# Patient Record
Sex: Female | Born: 1940
Health system: Southern US, Community
[De-identification: ages and names within clinical notes are randomized; demographics above are authoritative.]

## PROBLEM LIST (undated history)

## (undated) DIAGNOSIS — K589 Irritable bowel syndrome without diarrhea: Secondary | ICD-10-CM

## (undated) DIAGNOSIS — F419 Anxiety disorder, unspecified: Secondary | ICD-10-CM

## (undated) DIAGNOSIS — H6980 Other specified disorders of Eustachian tube, unspecified ear: Secondary | ICD-10-CM

## (undated) DIAGNOSIS — H6990 Unspecified Eustachian tube disorder, unspecified ear: Secondary | ICD-10-CM

## (undated) DIAGNOSIS — G3184 Mild cognitive impairment, so stated: Secondary | ICD-10-CM

## (undated) DIAGNOSIS — I639 Cerebral infarction, unspecified: Secondary | ICD-10-CM

## (undated) DIAGNOSIS — M1990 Unspecified osteoarthritis, unspecified site: Secondary | ICD-10-CM

## (undated) DIAGNOSIS — G4730 Sleep apnea, unspecified: Secondary | ICD-10-CM

## (undated) DIAGNOSIS — R55 Syncope and collapse: Secondary | ICD-10-CM

## (undated) DIAGNOSIS — I10 Essential (primary) hypertension: Secondary | ICD-10-CM

## (undated) DIAGNOSIS — K219 Gastro-esophageal reflux disease without esophagitis: Secondary | ICD-10-CM

## (undated) DIAGNOSIS — H269 Unspecified cataract: Secondary | ICD-10-CM

## (undated) DIAGNOSIS — E079 Disorder of thyroid, unspecified: Secondary | ICD-10-CM

## (undated) DIAGNOSIS — IMO0002 Reserved for concepts with insufficient information to code with codable children: Secondary | ICD-10-CM

## (undated) HISTORY — DX: Anxiety disorder, unspecified: F41.9

## (undated) HISTORY — DX: Unspecified osteoarthritis, unspecified site: M19.90

## (undated) HISTORY — DX: Gastro-esophageal reflux disease without esophagitis: K21.9

## (undated) HISTORY — DX: Other specified disorders of Eustachian tube, unspecified ear: H69.80

## (undated) HISTORY — DX: Essential (primary) hypertension: I10

## (undated) HISTORY — PX: ESOPHAGOGASTRODUODENOSCOPY: SHX1529

## (undated) HISTORY — DX: Cerebral infarction, unspecified: I63.9

## (undated) HISTORY — PX: EYE SURGERY: SHX253

## (undated) HISTORY — DX: Unspecified cataract: H26.9

## (undated) HISTORY — DX: Disorder of thyroid, unspecified: E07.9

## (undated) HISTORY — DX: Unspecified eustachian tube disorder, unspecified ear: H69.90

## (undated) HISTORY — PX: APPENDECTOMY: SHX54

## (undated) HISTORY — PX: COLONOSCOPY: SHX174

## (undated) HISTORY — DX: Mild cognitive impairment of uncertain or unknown etiology: G31.84

## (undated) HISTORY — DX: Reserved for concepts with insufficient information to code with codable children: IMO0002

## (undated) HISTORY — DX: Irritable bowel syndrome, unspecified: K58.9

## (undated) HISTORY — PX: FRACTURE SURGERY: SHX138

---

## 1982-08-21 HISTORY — PX: LUMBAR DISC SURGERY: SHX700

## 2000-12-24 ENCOUNTER — Encounter (INDEPENDENT_AMBULATORY_CARE_PROVIDER_SITE_OTHER): Payer: Self-pay

## 2000-12-24 ENCOUNTER — Other Ambulatory Visit: Admission: RE | Admit: 2000-12-24 | Discharge: 2000-12-24 | Payer: Self-pay | Admitting: Gastroenterology

## 2000-12-25 ENCOUNTER — Other Ambulatory Visit: Admission: RE | Admit: 2000-12-25 | Discharge: 2000-12-25 | Payer: Self-pay | Admitting: Family Medicine

## 2001-02-04 ENCOUNTER — Encounter: Payer: Self-pay | Admitting: Gastroenterology

## 2001-12-26 ENCOUNTER — Other Ambulatory Visit: Admission: RE | Admit: 2001-12-26 | Discharge: 2001-12-26 | Payer: Self-pay | Admitting: Family Medicine

## 2002-12-30 ENCOUNTER — Other Ambulatory Visit: Admission: RE | Admit: 2002-12-30 | Discharge: 2002-12-30 | Payer: Self-pay | Admitting: Family Medicine

## 2003-05-28 ENCOUNTER — Ambulatory Visit (HOSPITAL_COMMUNITY): Admission: RE | Admit: 2003-05-28 | Discharge: 2003-05-28 | Payer: Self-pay | Admitting: Gastroenterology

## 2003-05-28 ENCOUNTER — Encounter: Payer: Self-pay | Admitting: Gastroenterology

## 2003-12-31 ENCOUNTER — Other Ambulatory Visit: Admission: RE | Admit: 2003-12-31 | Discharge: 2003-12-31 | Payer: Self-pay | Admitting: Family Medicine

## 2004-06-22 ENCOUNTER — Ambulatory Visit: Payer: Self-pay | Admitting: Gastroenterology

## 2004-08-25 ENCOUNTER — Ambulatory Visit: Payer: Self-pay | Admitting: Gastroenterology

## 2005-01-05 ENCOUNTER — Ambulatory Visit: Payer: Self-pay | Admitting: Family Medicine

## 2005-01-05 ENCOUNTER — Other Ambulatory Visit: Admission: RE | Admit: 2005-01-05 | Discharge: 2005-01-05 | Payer: Self-pay | Admitting: Family Medicine

## 2005-01-13 ENCOUNTER — Ambulatory Visit: Payer: Self-pay | Admitting: Family Medicine

## 2005-05-03 ENCOUNTER — Ambulatory Visit: Payer: Self-pay | Admitting: Family Medicine

## 2005-06-16 ENCOUNTER — Ambulatory Visit: Payer: Self-pay | Admitting: Family Medicine

## 2005-06-20 ENCOUNTER — Ambulatory Visit: Payer: Self-pay | Admitting: Gastroenterology

## 2005-09-04 ENCOUNTER — Ambulatory Visit: Payer: Self-pay | Admitting: Family Medicine

## 2006-01-08 ENCOUNTER — Ambulatory Visit: Payer: Self-pay | Admitting: Family Medicine

## 2006-01-18 ENCOUNTER — Ambulatory Visit: Payer: Self-pay | Admitting: Family Medicine

## 2006-03-12 ENCOUNTER — Ambulatory Visit: Payer: Self-pay | Admitting: Family Medicine

## 2006-03-26 ENCOUNTER — Encounter: Admission: RE | Admit: 2006-03-26 | Discharge: 2006-03-26 | Payer: Self-pay | Admitting: General Surgery

## 2006-05-29 ENCOUNTER — Ambulatory Visit: Payer: Self-pay | Admitting: Family Medicine

## 2006-10-20 LAB — CONVERTED CEMR LAB: Pap Smear: NORMAL

## 2006-10-30 ENCOUNTER — Encounter: Admission: RE | Admit: 2006-10-30 | Discharge: 2006-10-30 | Payer: Self-pay | Admitting: Family Medicine

## 2006-11-01 ENCOUNTER — Other Ambulatory Visit: Admission: RE | Admit: 2006-11-01 | Discharge: 2006-11-01 | Payer: Self-pay | Admitting: Family Medicine

## 2006-11-01 ENCOUNTER — Encounter: Payer: Self-pay | Admitting: Family Medicine

## 2006-11-01 ENCOUNTER — Ambulatory Visit: Payer: Self-pay | Admitting: Family Medicine

## 2006-11-01 LAB — CONVERTED CEMR LAB
ALT: 18 units/L (ref 0–40)
AST: 22 units/L (ref 0–37)
Albumin: 3.7 g/dL (ref 3.5–5.2)
Alkaline Phosphatase: 70 units/L (ref 39–117)
BUN: 15 mg/dL (ref 6–23)
Basophils Absolute: 0 10*3/uL (ref 0.0–0.1)
Basophils Relative: 0.3 % (ref 0.0–1.0)
Bilirubin, Direct: 0.1 mg/dL (ref 0.0–0.3)
CO2: 33 meq/L — ABNORMAL HIGH (ref 19–32)
Calcium: 9.5 mg/dL (ref 8.4–10.5)
Chloride: 104 meq/L (ref 96–112)
Cholesterol: 223 mg/dL (ref 0–200)
Creatinine, Ser: 0.8 mg/dL (ref 0.4–1.2)
Direct LDL: 110.9 mg/dL
Eosinophils Absolute: 0.1 10*3/uL (ref 0.0–0.6)
Eosinophils Relative: 1.5 % (ref 0.0–5.0)
GFR calc Af Amer: 93 mL/min
GFR calc non Af Amer: 77 mL/min
Glucose, Bld: 85 mg/dL (ref 70–99)
HCT: 40.8 % (ref 36.0–46.0)
HDL: 78.1 mg/dL (ref >39.0)
Hemoglobin: 13.9 g/dL (ref 12.0–15.0)
Lymphocytes Relative: 29.4 % (ref 12.0–46.0)
MCHC: 34.1 g/dL (ref 30.0–36.0)
MCV: 88.4 fL (ref 78.0–100.0)
Monocytes Absolute: 0.4 10*3/uL (ref 0.2–0.7)
Monocytes Relative: 8.3 % (ref 3.0–11.0)
Neutro Abs: 3.2 10*3/uL (ref 1.4–7.7)
Neutrophils Relative %: 60.5 % (ref 43.0–77.0)
Platelets: 282 10*3/uL (ref 150–400)
Potassium: 3.8 meq/L (ref 3.5–5.1)
RBC: 4.61 M/uL (ref 3.87–5.11)
RDW: 13.2 % (ref 11.5–14.6)
Sodium: 143 meq/L (ref 135–145)
TSH: 2.4 microintl units/mL (ref 0.35–5.50)
Total Bilirubin: 0.9 mg/dL (ref 0.3–1.2)
Total CHOL/HDL Ratio: 2.9
Total Protein: 6.9 g/dL (ref 6.0–8.3)
Triglycerides: 68 mg/dL (ref 0–149)
VLDL: 14 mg/dL (ref 0–40)
WBC: 5.2 10*3/uL (ref 4.5–10.5)

## 2006-11-15 ENCOUNTER — Ambulatory Visit: Payer: Self-pay | Admitting: Family Medicine

## 2006-12-04 ENCOUNTER — Ambulatory Visit: Payer: Self-pay | Admitting: Family Medicine

## 2007-05-16 ENCOUNTER — Telehealth: Payer: Self-pay | Admitting: Family Medicine

## 2007-05-21 ENCOUNTER — Encounter: Admission: RE | Admit: 2007-05-21 | Discharge: 2007-05-21 | Payer: Self-pay | Admitting: Family Medicine

## 2007-05-23 ENCOUNTER — Encounter (INDEPENDENT_AMBULATORY_CARE_PROVIDER_SITE_OTHER): Payer: Self-pay | Admitting: *Deleted

## 2007-07-02 ENCOUNTER — Telehealth: Payer: Self-pay | Admitting: Family Medicine

## 2007-12-05 ENCOUNTER — Ambulatory Visit: Payer: Self-pay | Admitting: Family Medicine

## 2007-12-05 DIAGNOSIS — K219 Gastro-esophageal reflux disease without esophagitis: Secondary | ICD-10-CM | POA: Insufficient documentation

## 2007-12-05 DIAGNOSIS — H8109 Meniere's disease, unspecified ear: Secondary | ICD-10-CM | POA: Insufficient documentation

## 2007-12-05 DIAGNOSIS — E039 Hypothyroidism, unspecified: Secondary | ICD-10-CM | POA: Insufficient documentation

## 2007-12-10 ENCOUNTER — Ambulatory Visit: Payer: Self-pay | Admitting: Family Medicine

## 2007-12-11 LAB — CONVERTED CEMR LAB
Albumin: 4 g/dL (ref 3.5–5.2)
BUN: 12 mg/dL (ref 6–23)
Basophils Absolute: 0 10*3/uL (ref 0.0–0.1)
Basophils Relative: 0.1 % (ref 0.0–1.0)
CO2: 32 meq/L (ref 19–32)
Calcium: 9.8 mg/dL (ref 8.4–10.5)
Chloride: 103 meq/L (ref 96–112)
Cholesterol: 216 mg/dL (ref 0–200)
Creatinine, Ser: 0.9 mg/dL (ref 0.4–1.2)
Direct LDL: 118 mg/dL
Eosinophils Absolute: 0.1 10*3/uL (ref 0.0–0.7)
Eosinophils Relative: 1.5 % (ref 0.0–5.0)
GFR calc Af Amer: 81 mL/min
GFR calc non Af Amer: 67 mL/min
Glucose, Bld: 92 mg/dL (ref 70–99)
HCT: 43.1 % (ref 36.0–46.0)
HDL: 78.5 mg/dL (ref >39.0)
Hemoglobin: 14.1 g/dL (ref 12.0–15.0)
Lymphocytes Relative: 30.8 % (ref 12.0–46.0)
MCHC: 32.7 g/dL (ref 30.0–36.0)
MCV: 92.1 fL (ref 78.0–100.0)
Monocytes Absolute: 0.4 10*3/uL (ref 0.1–1.0)
Monocytes Relative: 8.2 % (ref 3.0–12.0)
Neutro Abs: 2.9 10*3/uL (ref 1.4–7.7)
Neutrophils Relative %: 59.4 % (ref 43.0–77.0)
Phosphorus: 4.1 mg/dL (ref 2.3–4.6)
Platelets: 253 10*3/uL (ref 150–400)
Potassium: 4.3 meq/L (ref 3.5–5.1)
RBC: 4.68 M/uL (ref 3.87–5.11)
RDW: 12.9 % (ref 11.5–14.6)
Sodium: 141 meq/L (ref 135–145)
Total CHOL/HDL Ratio: 2.8
Triglycerides: 55 mg/dL (ref 0–149)
VLDL: 11 mg/dL (ref 0–40)
WBC: 4.9 10*3/uL (ref 4.5–10.5)

## 2007-12-12 ENCOUNTER — Telehealth: Payer: Self-pay | Admitting: Family Medicine

## 2007-12-30 ENCOUNTER — Ambulatory Visit: Payer: Self-pay | Admitting: Family Medicine

## 2007-12-31 LAB — CONVERTED CEMR LAB
OCCULT 1: NEGATIVE
OCCULT 2: NEGATIVE
OCCULT 3: NEGATIVE

## 2008-01-27 ENCOUNTER — Ambulatory Visit: Payer: Self-pay | Admitting: Family Medicine

## 2008-01-27 LAB — CONVERTED CEMR LAB
Bilirubin Urine: NEGATIVE
Casts: 0 /lpf
Glucose, Urine, Semiquant: NEGATIVE
Ketones, urine, test strip: NEGATIVE
Mucus, UA: 0
Nitrite: NEGATIVE
RBC / HPF: 0
Specific Gravity, Urine: 1.015
Urine crystals, microscopic: 0 /hpf
Urobilinogen, UA: 0.2
WBC Urine, dipstick: NEGATIVE
Yeast, UA: 0
pH: 5

## 2008-01-28 ENCOUNTER — Encounter: Payer: Self-pay | Admitting: Family Medicine

## 2008-02-25 ENCOUNTER — Encounter: Payer: Self-pay | Admitting: Family Medicine

## 2008-05-26 ENCOUNTER — Encounter: Admission: RE | Admit: 2008-05-26 | Discharge: 2008-05-26 | Payer: Self-pay | Admitting: Family Medicine

## 2008-06-30 ENCOUNTER — Ambulatory Visit: Payer: Self-pay | Admitting: Gastroenterology

## 2008-07-01 ENCOUNTER — Telehealth: Payer: Self-pay | Admitting: Gastroenterology

## 2008-07-08 ENCOUNTER — Ambulatory Visit: Payer: Self-pay | Admitting: Gastroenterology

## 2008-07-08 ENCOUNTER — Ambulatory Visit (HOSPITAL_COMMUNITY): Admission: RE | Admit: 2008-07-08 | Discharge: 2008-07-08 | Payer: Self-pay | Admitting: Gastroenterology

## 2008-07-08 ENCOUNTER — Encounter: Payer: Self-pay | Admitting: Gastroenterology

## 2008-07-10 ENCOUNTER — Encounter: Payer: Self-pay | Admitting: Gastroenterology

## 2008-07-13 ENCOUNTER — Ambulatory Visit (HOSPITAL_COMMUNITY): Admission: RE | Admit: 2008-07-13 | Discharge: 2008-07-13 | Payer: Self-pay | Admitting: Gastroenterology

## 2008-09-21 ENCOUNTER — Telehealth: Payer: Self-pay | Admitting: Gastroenterology

## 2008-10-06 ENCOUNTER — Ambulatory Visit: Payer: Self-pay | Admitting: Family Medicine

## 2008-10-06 LAB — CONVERTED CEMR LAB
Bacteria, UA: 0
Bilirubin Urine: NEGATIVE
Blood in Urine, dipstick: NEGATIVE
Glucose, Urine, Semiquant: NEGATIVE
Ketones, urine, test strip: NEGATIVE
Nitrite: NEGATIVE
Specific Gravity, Urine: 1.015
Urobilinogen, UA: 0.2
pH: 6.5

## 2008-12-10 ENCOUNTER — Ambulatory Visit: Payer: Self-pay | Admitting: Family Medicine

## 2008-12-10 DIAGNOSIS — Z780 Asymptomatic menopausal state: Secondary | ICD-10-CM | POA: Insufficient documentation

## 2008-12-11 ENCOUNTER — Telehealth: Payer: Self-pay | Admitting: Family Medicine

## 2008-12-11 LAB — CONVERTED CEMR LAB
ALT: 19 units/L (ref 0–35)
AST: 25 units/L (ref 0–37)
Albumin: 4.1 g/dL (ref 3.5–5.2)
Alkaline Phosphatase: 75 units/L (ref 39–117)
BUN: 15 mg/dL (ref 6–23)
Basophils Absolute: 0.1 10*3/uL (ref 0.0–0.1)
Basophils Relative: 1 % (ref 0.0–3.0)
Bilirubin, Direct: 0.1 mg/dL (ref 0.0–0.3)
CO2: 29 meq/L (ref 19–32)
Calcium: 9.4 mg/dL (ref 8.4–10.5)
Chloride: 103 meq/L (ref 96–112)
Creatinine, Ser: 0.9 mg/dL (ref 0.4–1.2)
Eosinophils Absolute: 0.1 10*3/uL (ref 0.0–0.7)
Eosinophils Relative: 2.4 % (ref 0.0–5.0)
Glucose, Bld: 84 mg/dL (ref 70–99)
HCT: 40.7 % (ref 36.0–46.0)
Hemoglobin: 13.9 g/dL (ref 12.0–15.0)
Lymphocytes Relative: 31.5 % (ref 12.0–46.0)
Lymphs Abs: 1.6 10*3/uL (ref 0.7–4.0)
MCHC: 34.2 g/dL (ref 30.0–36.0)
MCV: 90.9 fL (ref 78.0–100.0)
Monocytes Absolute: 0.5 10*3/uL (ref 0.1–1.0)
Monocytes Relative: 9.4 % (ref 3.0–12.0)
Neutro Abs: 2.9 10*3/uL (ref 1.4–7.7)
Neutrophils Relative %: 55.7 % (ref 43.0–77.0)
Phosphorus: 4.5 mg/dL (ref 2.3–4.6)
Platelets: 234 10*3/uL (ref 150.0–400.0)
Potassium: 3.8 meq/L (ref 3.5–5.1)
RBC: 4.48 M/uL (ref 3.87–5.11)
RDW: 13 % (ref 11.5–14.6)
Sodium: 139 meq/L (ref 135–145)
TSH: 2.5 microintl units/mL (ref 0.35–5.50)
Total Bilirubin: 0.9 mg/dL (ref 0.3–1.2)
Total Protein: 7 g/dL (ref 6.0–8.3)
WBC: 5.2 10*3/uL (ref 4.5–10.5)

## 2008-12-17 ENCOUNTER — Telehealth: Payer: Self-pay | Admitting: Family Medicine

## 2008-12-22 ENCOUNTER — Ambulatory Visit: Payer: Self-pay | Admitting: Family Medicine

## 2008-12-24 ENCOUNTER — Encounter (INDEPENDENT_AMBULATORY_CARE_PROVIDER_SITE_OTHER): Payer: Self-pay | Admitting: *Deleted

## 2008-12-24 LAB — CONVERTED CEMR LAB
OCCULT 1: NEGATIVE
OCCULT 2: NEGATIVE
OCCULT 3: NEGATIVE

## 2009-01-21 ENCOUNTER — Encounter: Payer: Self-pay | Admitting: Family Medicine

## 2009-04-02 ENCOUNTER — Ambulatory Visit: Payer: Self-pay | Admitting: Family Medicine

## 2009-04-02 DIAGNOSIS — K589 Irritable bowel syndrome without diarrhea: Secondary | ICD-10-CM | POA: Insufficient documentation

## 2009-04-02 DIAGNOSIS — R5383 Other fatigue: Secondary | ICD-10-CM

## 2009-04-02 DIAGNOSIS — R5381 Other malaise: Secondary | ICD-10-CM | POA: Insufficient documentation

## 2009-04-05 LAB — CONVERTED CEMR LAB
ALT: 18 units/L (ref 0–35)
AST: 24 units/L (ref 0–37)
Albumin: 4.4 g/dL (ref 3.5–5.2)
Alkaline Phosphatase: 71 units/L (ref 39–117)
BUN: 16 mg/dL (ref 6–23)
Basophils Absolute: 0 10*3/uL (ref 0.0–0.1)
Basophils Relative: 0 % (ref 0.0–3.0)
Bilirubin, Direct: 0.1 mg/dL (ref 0.0–0.3)
CO2: 30 meq/L (ref 19–32)
Calcium: 9.5 mg/dL (ref 8.4–10.5)
Chloride: 103 meq/L (ref 96–112)
Creatinine, Ser: 0.9 mg/dL (ref 0.4–1.2)
Eosinophils Absolute: 0.1 10*3/uL (ref 0.0–0.7)
Eosinophils Relative: 1.9 % (ref 0.0–5.0)
Folate: 10.5 ng/mL
GFR calc non Af Amer: 66.22 mL/min (ref >60)
Glucose, Bld: 85 mg/dL (ref 70–99)
HCT: 41.7 % (ref 36.0–46.0)
Hemoglobin: 13.9 g/dL (ref 12.0–15.0)
Lymphocytes Relative: 32.8 % (ref 12.0–46.0)
Lymphs Abs: 2.2 10*3/uL (ref 0.7–4.0)
MCHC: 33.4 g/dL (ref 30.0–36.0)
MCV: 92 fL (ref 78.0–100.0)
Monocytes Absolute: 0.7 10*3/uL (ref 0.1–1.0)
Monocytes Relative: 9.7 % (ref 3.0–12.0)
Neutro Abs: 3.8 10*3/uL (ref 1.4–7.7)
Neutrophils Relative %: 55.6 % (ref 43.0–77.0)
Platelets: 228 10*3/uL (ref 150.0–400.0)
Potassium: 3.9 meq/L (ref 3.5–5.1)
RBC: 4.53 M/uL (ref 3.87–5.11)
RDW: 13.1 % (ref 11.5–14.6)
Sodium: 141 meq/L (ref 135–145)
TSH: 5.18 microintl units/mL (ref 0.35–5.50)
Total Bilirubin: 0.8 mg/dL (ref 0.3–1.2)
Total Protein: 7.1 g/dL (ref 6.0–8.3)
Vitamin B-12: >1500 pg/mL — ABNORMAL HIGH (ref 211–911)
WBC: 6.8 10*3/uL (ref 4.5–10.5)

## 2009-04-14 ENCOUNTER — Telehealth: Payer: Self-pay | Admitting: Family Medicine

## 2009-04-14 DIAGNOSIS — R51 Headache: Secondary | ICD-10-CM | POA: Insufficient documentation

## 2009-04-14 DIAGNOSIS — R519 Headache, unspecified: Secondary | ICD-10-CM | POA: Insufficient documentation

## 2009-05-27 ENCOUNTER — Ambulatory Visit: Payer: Self-pay | Admitting: Gastroenterology

## 2009-05-28 ENCOUNTER — Telehealth: Payer: Self-pay | Admitting: Gastroenterology

## 2009-06-17 ENCOUNTER — Telehealth: Payer: Self-pay | Admitting: Physician Assistant

## 2009-06-18 ENCOUNTER — Telehealth: Payer: Self-pay | Admitting: Family Medicine

## 2009-06-22 ENCOUNTER — Ambulatory Visit: Payer: Self-pay | Admitting: Family Medicine

## 2009-06-22 LAB — CONVERTED CEMR LAB
IgA: 89 mg/dL (ref 68–378)
Tissue Transglutaminase Ab, IgA: 0.1 units (ref <7)

## 2009-06-28 ENCOUNTER — Telehealth: Payer: Self-pay | Admitting: Gastroenterology

## 2009-07-02 ENCOUNTER — Ambulatory Visit: Payer: Self-pay | Admitting: Family Medicine

## 2009-07-02 DIAGNOSIS — M545 Low back pain, unspecified: Secondary | ICD-10-CM | POA: Insufficient documentation

## 2009-07-05 ENCOUNTER — Encounter: Admission: RE | Admit: 2009-07-05 | Discharge: 2009-07-05 | Payer: Self-pay | Admitting: Family Medicine

## 2009-07-22 ENCOUNTER — Encounter: Payer: Self-pay | Admitting: Family Medicine

## 2009-07-26 ENCOUNTER — Telehealth: Payer: Self-pay | Admitting: Family Medicine

## 2009-07-27 ENCOUNTER — Encounter: Payer: Self-pay | Admitting: Family Medicine

## 2009-08-21 ENCOUNTER — Encounter: Payer: Self-pay | Admitting: Family Medicine

## 2009-09-21 ENCOUNTER — Encounter: Payer: Self-pay | Admitting: Family Medicine

## 2009-09-23 ENCOUNTER — Encounter: Admission: RE | Admit: 2009-09-23 | Discharge: 2009-09-23 | Payer: Self-pay | Admitting: Family Medicine

## 2009-09-27 ENCOUNTER — Encounter (INDEPENDENT_AMBULATORY_CARE_PROVIDER_SITE_OTHER): Payer: Self-pay | Admitting: *Deleted

## 2009-10-26 ENCOUNTER — Telehealth: Payer: Self-pay | Admitting: Family Medicine

## 2009-12-17 ENCOUNTER — Ambulatory Visit: Payer: Self-pay | Admitting: Family Medicine

## 2009-12-17 ENCOUNTER — Other Ambulatory Visit: Admission: RE | Admit: 2009-12-17 | Discharge: 2009-12-17 | Payer: Self-pay | Admitting: Family Medicine

## 2009-12-17 DIAGNOSIS — M8580 Other specified disorders of bone density and structure, unspecified site: Secondary | ICD-10-CM | POA: Insufficient documentation

## 2009-12-20 ENCOUNTER — Encounter: Payer: Self-pay | Admitting: Family Medicine

## 2009-12-20 LAB — CONVERTED CEMR LAB
ALT: 17 units/L (ref 0–35)
AST: 24 units/L (ref 0–37)
Albumin: 4.2 g/dL (ref 3.5–5.2)
BUN: 13 mg/dL (ref 6–23)
Basophils Absolute: 0 10*3/uL (ref 0.0–0.1)
Basophils Relative: 0.6 % (ref 0.0–3.0)
CO2: 31 meq/L (ref 19–32)
Calcium: 9.3 mg/dL (ref 8.4–10.5)
Chloride: 103 meq/L (ref 96–112)
Cholesterol: 223 mg/dL — ABNORMAL HIGH (ref 0–200)
Creatinine, Ser: 0.8 mg/dL (ref 0.4–1.2)
Direct LDL: 117.7 mg/dL
Eosinophils Absolute: 0.1 10*3/uL (ref 0.0–0.7)
Eosinophils Relative: 1.8 % (ref 0.0–5.0)
GFR calc non Af Amer: 75.7 mL/min (ref >60)
Glucose, Bld: 82 mg/dL (ref 70–99)
HCT: 39.9 % (ref 36.0–46.0)
HDL: 83.1 mg/dL (ref >39.00)
Hemoglobin: 13.7 g/dL (ref 12.0–15.0)
Lymphocytes Relative: 29 % (ref 12.0–46.0)
Lymphs Abs: 1.9 10*3/uL (ref 0.7–4.0)
MCHC: 34.3 g/dL (ref 30.0–36.0)
MCV: 90.2 fL (ref 78.0–100.0)
Monocytes Absolute: 0.6 10*3/uL (ref 0.1–1.0)
Monocytes Relative: 9.4 % (ref 3.0–12.0)
Neutro Abs: 3.9 10*3/uL (ref 1.4–7.7)
Neutrophils Relative %: 59.2 % (ref 43.0–77.0)
Phosphorus: 4.3 mg/dL (ref 2.3–4.6)
Platelets: 256 10*3/uL (ref 150.0–400.0)
Potassium: 3.9 meq/L (ref 3.5–5.1)
RBC: 4.42 M/uL (ref 3.87–5.11)
RDW: 13.8 % (ref 11.5–14.6)
Sodium: 139 meq/L (ref 135–145)
TSH: 5.63 microintl units/mL — ABNORMAL HIGH (ref 0.35–5.50)
Total CHOL/HDL Ratio: 3
Triglycerides: 91 mg/dL (ref 0.0–149.0)
VLDL: 18.2 mg/dL (ref 0.0–40.0)
Vit D, 25-Hydroxy: 41 ng/mL (ref 30–89)
WBC: 6.7 10*3/uL (ref 4.5–10.5)

## 2009-12-22 ENCOUNTER — Encounter (INDEPENDENT_AMBULATORY_CARE_PROVIDER_SITE_OTHER): Payer: Self-pay | Admitting: *Deleted

## 2009-12-22 LAB — CONVERTED CEMR LAB: Pap Smear: NEGATIVE

## 2009-12-25 ENCOUNTER — Encounter: Payer: Self-pay | Admitting: Family Medicine

## 2009-12-30 ENCOUNTER — Telehealth (INDEPENDENT_AMBULATORY_CARE_PROVIDER_SITE_OTHER): Payer: Self-pay

## 2010-01-11 ENCOUNTER — Telehealth: Payer: Self-pay | Admitting: Family Medicine

## 2010-01-12 ENCOUNTER — Telehealth (INDEPENDENT_AMBULATORY_CARE_PROVIDER_SITE_OTHER): Payer: Self-pay | Admitting: *Deleted

## 2010-01-28 ENCOUNTER — Ambulatory Visit: Payer: Self-pay | Admitting: Family Medicine

## 2010-01-31 LAB — CONVERTED CEMR LAB: TSH: 1.58 microintl units/mL (ref 0.35–5.50)

## 2010-03-28 ENCOUNTER — Telehealth (INDEPENDENT_AMBULATORY_CARE_PROVIDER_SITE_OTHER): Payer: Self-pay

## 2010-04-22 ENCOUNTER — Telehealth: Payer: Self-pay | Admitting: Family Medicine

## 2010-06-28 ENCOUNTER — Encounter: Payer: Self-pay | Admitting: Family Medicine

## 2010-07-25 ENCOUNTER — Telehealth: Payer: Self-pay | Admitting: Family Medicine

## 2010-07-25 ENCOUNTER — Encounter: Payer: Self-pay | Admitting: Family Medicine

## 2010-09-20 NOTE — Progress Notes (Signed)
Summary: refill request for ergotamine/ caffeine  Phone Note Refill Request Message from:  Fax from Pharmacy  Refills Requested: Medication #1:  ERGOTAMINE-CAFFEINE 1-100 MG  TABS 2 by mouth as needed headache   Last Refilled: 04/14/2009 Faxed request from [place], [phone].  Initial call taken by: Lowella Petties CMA,  July 26, 2009 10:16 AM  Follow-up for Phone Call        px written on EMR for call in  Follow-up by: Judith Part MD,  July 26, 2009 10:32 AM  Additional Follow-up for Phone Call Additional follow up Details #1::        Called to [facility]. Additional Follow-up by: Lowella Petties CMA,  July 26, 2009 10:51 AM    Prescriptions: ERGOTAMINE-CAFFEINE 1-100 MG  TABS (ERGOTAMINE-CAFFEINE) 2 by mouth as needed headache, then 1 by mouth one hour later prn  #30 x 0   Entered and Authorized by:   Judith Part MD   Signed by:   Judith Part MD on 07/26/2009   Method used:   Telephoned to ...       [facility]* (mail-order)             ,          Ph: [phone]       Fax: [phone]   RxID:   [id number]

## 2010-09-20 NOTE — Progress Notes (Signed)
Summary: ? stool cards  Phone Note Call from Patient Call back at Home Phone [phone]   Caller: Patient Call For: dr Bowie Delia Summary of Call: pt states she didn't get stool cards at phys last week, do you want her to have these? Initial call taken by: Lowella Petties,  December 12, 2007 11:57 AM  Follow-up for Phone Call        yes, please send her some with instructions Follow-up by: Judith Part MD,  December 12, 2007 1:20 PM  Additional Follow-up for Phone Call Additional follow up Details #1::        [facility] advising pt she can pick up cards Additional Follow-up by: Lowella Petties,  December 12, 2007 2:17 PM

## 2010-09-20 NOTE — Op Note (Signed)
Summary: Lumbar Epidural Steroid Injection/[facility]   Lumbar Epidural Steroid Injection/[facility]   Imported By: Lanelle Bal 07/06/2010 13:35:52  _____________________________________________________________________  External Attachment:    Type:   Image     Comment:   External Document

## 2010-09-20 NOTE — Procedures (Signed)
Summary: Colon   Colonoscopy  Procedure date:  02/04/2001  Findings:      Location:  [facility].    Patient Name: Sarah Harper, Sarah Harper MRN:  Procedure Procedures: Colonoscopy CPT: [phone].  Personnel: Endoscopist: Vania Rea. Jarold Motto, MD.  Referred By: Roxy Manns, MD.  Exam Location: Exam performed in Outpatient Clinic. Outpatient  Patient Consent: Procedure, Alternatives, Risks and Benefits discussed, consent obtained, from patient.  Indications Symptoms: Weight Loss.  History  Pre-Exam Physical: Performed Feb 04, 2001. Cardio-pulmonary exam, Rectal exam, Abdominal exam, Extremity exam, Mental status exam WNL.  Exam Exam: Extent of exam reached: Cecum, extent intended: Cecum.  The cecum was identified by appendiceal orifice and IC valve. Patient position: on left side. Duration of exam: 15 minutes. Colon retroflexion performed. Images taken. ASA Classification: I. Tolerance: excellent.  Monitoring: Pulse and BP monitoring, Oximetry used. Supplemental O2 given.  Colon Prep Used Golytely for colon prep. Prep results: excellent.  Sedation Meds: Patient assessed and found to be appropriate for moderate (conscious) sedation. Fentanyl 50 mcg. Versed 5 mg.  Instrument(s): PCF 140L. Serial [id number]. Serial #0.  Findings - NORMAL EXAM: Cecum to Rectum. Not Seen: Polyps. Tumors. Crohn's. Diverticulosis. Comments: Very tortuous and redundant colon.   Assessment Normal examination.  Comments: Hx. of depression and probable IBS. Events  Unplanned Interventions: No intervention was required.  Plans Medication Plan: Continue current medications.  Patient Education: Patient given standard instructions for: Patient instructed to get routine colonoscopy every 5 years.  Disposition: After procedure patient sent to recovery. After recovery patient sent home.  Scheduling/Referral: Follow-Up prn.   This report was created from the original endoscopy  report, which was reviewed and signed by the above listed endoscopist.

## 2010-09-20 NOTE — Consult Note (Signed)
Summary: [facility]/Dr. Isabel Caprice  [facility]/Dr. Isabel Caprice   Imported By: Eleonore Chiquito 02/28/2008 11:46:05  _____________________________________________________________________  External Attachment:    Type:   Image     Comment:   External Document  Appended Document: [facility]/Dr. Isabel Caprice please send last prog note and urine cx to urologist- thanks  Appended Document: [facility]/Dr. Isabel Caprice Faxed last prog note and urine cx as instructed.

## 2010-09-20 NOTE — Progress Notes (Signed)
Summary: Talk to nurse   Phone Note Call from Patient Call back at Home Phone [phone] Call back at [facility] [phone]   Call For: DR Dai Apel Reason for Call: Talk to Nurse Initial call taken by: Leanor Kail [facility],  May 28, 2009 11:00 AM  Follow-up for Phone Call        Rx for Jeneen Rinks was sent to wrong pharmacy.  Resent to [facility] pharmacy.  Rx cancelled at [facility].  Pt notified. Follow-up by: Ashok Cordia RN,  May 28, 2009 11:48 AM    Prescriptions: Burman Blacksmith 550 MG TABS (RIFAXIMIN) 1 by mouth two times a day  #20 x 0   Entered by:   Ashok Cordia RN   Authorized by:   Mardella Layman MD [facility]   Signed by:   Ashok Cordia RN on 05/28/2009   Method used:   Electronically to        [facility]* (retail)       [address]       [place], [place]  [address]       Ph: [phone]       Fax: [phone]   RxID:   [id number]

## 2010-09-20 NOTE — Miscellaneous (Signed)
Summary: Gastric Emptying Study  Clinical Lists Changes  Problems: Added new problem of CHEST PAIN (ICD-786.50) Orders: Added new Test order of Gastric Emptying Scan (GES) - Signed

## 2010-09-20 NOTE — Letter (Signed)
Summary: Generic Letter  [place] [facility]  [address] [place], [place] [address]   Phone: [phone]  Fax: [phone]    12/24/2008    [facility] [address] [place] [place], [place]  [address]     Dear Ms. ALTO,   Your stool cards were normal, please repeat this screening in one year.     Sincerely,   Liane Comber

## 2010-09-20 NOTE — Letter (Signed)
Summary: Patient Notice-Endo Biopsy Results  [facility]  [address] [place], [place] [address]   Phone: [phone]  Fax: [phone]        July 10, 2008 MRN: [id number]    [facility] [address] [place] [place], [place]  [address]    Dear Sarah Harper,  I am pleased to inform you that the biopsies taken during your recent endoscopic examination did not show any evidence of cancer upon pathologic examination.  Additional information/recommendations:  __No further action is needed at this time.  Please follow-up with      your primary care physician for your other healthcare needs.  __ Please call [phone] to schedule a return visit to review      your condition.  xx__ Continue with the treatment plan as outlined on the day of your      exam.  __ You should have a repeat endoscopic examination for thi_s problem              in _ months/years.   Please call us if you are having persistent problems or have questions about your condition that have not been fully answered at this time.  Sincerely,  Mardella Layman MD [facility]  This letter has been electronically signed by your physician.

## 2010-09-20 NOTE — Progress Notes (Signed)
Summary: ? something for sleep  Phone Note Call from Patient Call back at Home Phone [phone]   Caller: Patient Call For: Judith Part MD Summary of Call: Patient is asking if she can get a Rx. for something very mild to help her sleep.  Her husband has been dx. with thyroid CA and will be undergoing surgery soon and she is very "keyed up" and unable to sleep.  She does not want anything strong, in fact, she says she was prescribed something about 8 years ago when he had colon cancer surgery and she never took the entire Rx. because it was too strong.  She does not remember the name of that medication.  Uses [facility] Phone:   [phone] Initial call taken by: Delilah Shan CMA [facility]ull),  Jan 11, 2010 12:50 PM  Follow-up for Phone Call        can try low dose of ambien just short term-- because it is habit forming  px written on EMR for call in  f/u if not imp Follow-up by: Judith Part MD,  Jan 11, 2010 1:13 PM  Additional Follow-up for Phone Call Additional follow up Details #1::        Patient Advised. Medication phoned to pharmacy.  Additional Follow-up by: Delilah Shan CMA Duncan Dull),  Jan 11, 2010 4:22 PM    New/Updated Medications: AMBIEN 5 MG TABS (ZOLPIDEM TARTRATE) 1 by mouth at bedtime as needed insomnia Prescriptions: AMBIEN 5 MG TABS (ZOLPIDEM TARTRATE) 1 by mouth at bedtime as needed insomnia  #30 x 0   Entered and Authorized by:   Judith Part MD   Signed by:   Delilah Shan CMA (AAMA) on 01/11/2010   Method used:   Telephoned to ...       [facility]* (mail-order)             ,          Ph: [phone]       Fax: [phone]   RxID:   [id number]

## 2010-09-20 NOTE — Progress Notes (Signed)
Summary: refill request for triamcinolone  Phone Note Refill Request Message from:  Fax from Pharmacy  Refills Requested: Medication #1:  triamamcinolone 0.025%   Last Refilled: 12/04/2006 This is not on med list.  Faxed request from [place] [facility] is on your shelf.  Initial call taken by: Lowella Petties,  December 17, 2008 12:12 PM  Follow-up for Phone Call        since not on med list - please confirm with her what she is taking it for - then I will refil Follow-up by: Judith Part MD,  December 17, 2008 1:36 PM  Additional Follow-up for Phone Call Additional follow up Details #1::        Pt says you prescribed it for her years ago for an itchy spot on her eyelid, dermatitis, she applys a small amount two times a day [facility]  December 17, 2008 1:55 PM  use very sparingly-- prefer once daily if symptoms are not severe-update if not imp px written on EMR for call in  Additional Follow-up by: Judith Part MD,  December 17, 2008 2:08 PM    Additional Follow-up for Phone Call Additional follow up Details #2::    prescrip: called to pharmacy line Follow-up by: Providence Crosby,  December 17, 2008 2:24 PM  New/Updated Medications: TRIAMCINOLONE ACETONIDE 0.025 % CREA (TRIAMCINOLONE ACETONIDE) apply to affected area every other day to once daily as needed   Prescriptions: TRIAMCINOLONE ACETONIDE 0.025 % CREA (TRIAMCINOLONE ACETONIDE) apply to affected area every other day to once daily as needed  #1 small x 1   Entered and Authorized by:   Judith Part MD   Signed by:   Providence Crosby on 12/17/2008   Method used:   Telephoned to ...       [facility]* (retail)       [address]       [place], [place]  [address]       Ph: [phone]       Fax: [phone]   RxID:   [id number]

## 2010-09-20 NOTE — Miscellaneous (Signed)
Summary: med list update  Medications Added BUTALBITAL-APAP-CAFFEINE 50-325-40 MG TABS (BUTALBITAL-APAP-CAFFEINE) take 1 - 2 tablets by  mouth up to every 4 - 6 hours as needed for headache.       Clinical Lists Changes  Medications: Added new medication of BUTALBITAL-APAP-CAFFEINE 50-325-40 MG TABS (BUTALBITAL-APAP-CAFFEINE) take 1 - 2 tablets by  mouth up to every 4 - 6 hours as needed for headache. Removed medication of BUTALBITAL-APAP-CAFFEINE 50-500-40 MG  TABS (BUTALBITAL-APAP-CAFFEINE) 2 at onset of headache 1 q4hours prn     Current Allergies: ! EPINEPHRINE

## 2010-09-20 NOTE — Miscellaneous (Signed)
Summary: PT Care Plan/[facility]  PT Care Plan/[facility]   Imported By: Lanelle Bal 07/31/2009 11:37:51  _____________________________________________________________________  External Attachment:    Type:   Image     Comment:   External Document

## 2010-09-20 NOTE — Miscellaneous (Signed)
Summary: Vitamin D 1000ir rx  Medications Added VITAMIN D 1000 UNIT TABS (CHOLECALCIFEROL) Take 1 tablet by mouth once a day       Clinical Lists Changes  Medications: Added new medication of VITAMIN D 1000 UNIT TABS (CHOLECALCIFEROL) Take 1 tablet by mouth once a day     Current Allergies: ! EPINEPHRINE

## 2010-09-20 NOTE — Progress Notes (Signed)
Summary: refill request for ergotamine  Phone Note Refill Request Message from:  Fax from Pharmacy  Refills Requested: Medication #1:  ERGOTAMINE-CAFFEINE 1-100 MG  TABS 2 by mouth as needed headache   Last Refilled: 12/18/2008 Faxed request from [facility].  Initial call taken by: Lowella Petties CMA,  April 14, 2009 8:39 AM  Follow-up for Phone Call        Rx called to pharmacy. Follow-up by: Sydell Axon LPN,  April 14, 2009 9:57 AM  New Problems: HEADACHE (ICD-784.0)   New Problems: HEADACHE (ICD-784.0) Prescriptions: ERGOTAMINE-CAFFEINE 1-100 MG  TABS (ERGOTAMINE-CAFFEINE) 2 by mouth as needed headache, then 1 by mouth one hour later prn  #30 x 0   Entered and Authorized by:   Judith Part MD   Signed by:   Sydell Axon LPN on 16/05/9603   Method used:   Telephoned to ...       [facility]* (retail)       [address]       [place], [place]  [address]       Ph: [phone]       Fax: [phone]   RxID:   [id number]

## 2010-09-20 NOTE — Progress Notes (Signed)
Summary: written rx  Medications Added SYNTHROID 75 MCG  TABS (LEVOTHYROXINE SODIUM) 1 once daily DYAZIDE 37.5-25 MG  CAPS (TRIAMTERENE-HCTZ) 1 once daily prn PREMARIN 0.625 MG/GM  CREA (ESTROGENS, CONJUGATED) 1 applicator monday wednesday// friday at hs REGLAN 5 MG  TABS (METOCLOPRAMIDE HCL) 1 three times a day ac meals NEXIUM 40 MG  CPDR (ESOMEPRAZOLE MAGNESIUM) 1 bid BUTALBITAL-APAP-CAFFEINE 50-500-40 MG  TABS (BUTALBITAL-APAP-CAFFEINE) 2 at onset of headache 1 q4hours prn       Phone Note Call from Patient   Caller: Patient Call For: Dr. Milinda Antis Summary of Call: Pt. request written rx for Reglan 5 mg three times a day with meals, needs 90 day supply with 3 refills for mail order, she has gotten this from  Dr. Milinda Antis in the past.  Please call when ready for pickup.  Know it will be tomorrow before she gets a response.  Make sure it is written for generic !!!  Chart is in your in box. Initial call taken by: Sydell Axon,  May 16, 2007 4:20 PM    New/Updated Medications: SYNTHROID 75 MCG  TABS (LEVOTHYROXINE SODIUM) 1 once daily DYAZIDE 37.5-25 MG  CAPS (TRIAMTERENE-HCTZ) 1 once daily prn PREMARIN 0.625 MG/GM  CREA (ESTROGENS, CONJUGATED) 1 applicator monday wednesday// friday at hs REGLAN 5 MG  TABS (METOCLOPRAMIDE HCL) 1 three times a day ac meals NEXIUM 40 MG  CPDR (ESOMEPRAZOLE MAGNESIUM) 1 bid BUTALBITAL-APAP-CAFFEINE 50-500-40 MG  TABS (BUTALBITAL-APAP-CAFFEINE) 2 at onset of headache 1 q4hours prn   Prescriptions: REGLAN 5 MG  TABS (METOCLOPRAMIDE HCL) 1 three times a day ac meals  #270 x 2   Entered by:   Liane Comber   Authorized by:   Kerby Nora MD   Signed by:   Liane Comber on 05/20/2007   Method used:   Print then Give to Patient   RxID:   [id number] REGLAN 5 MG  TABS (METOCLOPRAMIDE HCL) 1 three times a day ac meals  #270 x 2   Entered by:   Providence Crosby   Authorized by:   Judith Part MD   Signed by:   Providence Crosby on 05/17/2007   Method  used:   Print then Give to Patient   RxID:   [id number] REGLAN 5 MG  TABS (METOCLOPRAMIDE HCL) 1 three times a day ac meals  #270 x 2   Entered by:   Providence Crosby   Authorized by:   Kerby Nora MD   Signed by:   Providence Crosby on 05/17/2007   Method used:   Print then Give to Patient   RxID:   [id number]

## 2010-09-20 NOTE — Miscellaneous (Signed)
Summary: synthroid rx  Medications Added SYNTHROID 88 MCG TABS (LEVOTHYROXINE SODIUM) take one tablet by mouth once daily       Clinical Lists Changes  Medications: Added new medication of SYNTHROID 88 MCG TABS (LEVOTHYROXINE SODIUM) take one tablet by mouth once daily - Signed Removed medication of SYNTHROID 75 MCG  TABS (LEVOTHYROXINE SODIUM) 1 by mouth once daily Rx of SYNTHROID 88 MCG TABS (LEVOTHYROXINE SODIUM) take one tablet by mouth once daily;  #30 x 11;  Signed;  Entered by: Lewanda Rife LPN;  Authorized by: Judith Part MD;  Method used: Electronically to [facility] [address], [place], [place]  [address], Ph: [phone], Fax: [phone]    Prescriptions: SYNTHROID 88 MCG TABS (LEVOTHYROXINE SODIUM) take one tablet by mouth once daily  #30 x 11   Entered by:   Lewanda Rife LPN   Authorized by:   Judith Part MD   Signed by:   Lewanda Rife LPN on 13/03/6577   Method used:   Electronically to        [facility]* (retail)       [address]       [place], [place]  [address]       Ph: [phone]       Fax: [phone]   RxID:   [id number]  Pt has lab appt to [facility] on 02/01/10 at 9:30am as instructed.Lewanda Rife LPN  Dec 21, 6431 4:38 PM  Current Allergies: ! EPINEPHRINE

## 2010-09-20 NOTE — Progress Notes (Signed)
Summary: pt needs labs for GI  Phone Note From Other Clinic   Caller: Lupita Leash at [facility]  [phone] Summary of Call: Pt needs to get a celiac panel and an iGA level drawn, pt wants to get these drawn here, as it is more convenient for her.  Please advise. Initial call taken by: Lowella Petties CMA,  June 18, 2009 8:52 AM  Follow-up for Phone Call        that is fine with me-- just let me know what a "celiac panel is"- and what dx to use -- and go ahead and sched draw Follow-up by: Judith Part MD,  June 21, 2009 8:46 AM  Additional Follow-up for Phone Call Additional follow up Details #1::        Left message on voicemail  to return call to set up lab appt.  Lupita Leash at GI is putting in the order. Lugene Fuquay CMA Duncan Dull)  June 21, 2009 11:25 AM     Additional Follow-up for Phone Call Additional follow up Details #2::    Lab appointment scheduled:  06/22/2009  Follow-up by: Delilah Shan CMA (AAMA),  June 21, 2009 2:18 PM

## 2010-09-20 NOTE — Progress Notes (Signed)
SummaryJannetta Harper  Phone Note Refill Request Call back at [phone] Message from:  [facility] on July 25, 2010 2:21 PM  Refills Requested: Medication #1:  BUTALBITAL-APAP-CAFFEINE 50-325-40 MG TABS take 1 - 2 tablets by  mouth up to every 4 - 6 hours as needed for headache..   Last Refilled: 04/22/2010 [facility] electronically request refill for Butalbital-APAP-Caff 50-325-40. Last refill was 04/22/10. Dr Milinda Antis is out of the office today and this is the second [facility] today.Please advise.    Method Requested: Telephone to Pharmacy Initial call taken by: Lewanda Rife LPN,  July 25, 2010 2:23 PM  Follow-up for Phone Call        px written on EMR for call in  Follow-up by: Judith Part MD,  July 25, 2010 4:02 PM  Additional Follow-up for Phone Call Additional follow up Details #1::        [facility]ication [facility] as instructed. Lewanda Rife LPN  July 25, 2010 4:22 PM     New/Updated Medications: FIORICET 50-325-40 MG TABS (BUTALBITAL-APAP-CAFFEINE) 1-2 by mouth up to every 4-6 hours as needed headache Prescriptions: FIORICET 50-325-40 MG TABS (BUTALBITAL-APAP-CAFFEINE) 1-2 by mouth up to every 4-6 hours as needed headache  #30 x 0   Entered and Authorized by:   Judith Part MD   Signed by:   Lewanda Rife LPN on 47/82/9562   Method used:   Telephoned to ...       [facility]* (retail)             ,          Ph: [phone]       Fax: [phone]   RxID:   [id number]

## 2010-09-20 NOTE — Assessment & Plan Note (Signed)
Summary: ABD PAIN/CLE   Vital Signs:  Patient profile:   70 year old female Height:      65.25 inches Weight:      129 pounds BMI:     21.38 Temp:     97.6 degrees F oral Pulse rate:   72 / minute Pulse rhythm:   regular BP sitting:   110 / 70  (left arm) Cuff size:   large  Vitals Entered By: Liane Comber CMA (April 02, 2009 11:22 AM)  History of Present Illness: is really tired all the time  not enough energy  is having more low abd pain than usual -- in different places  has chronic abd pain/ ibs - flares with stress no dietary changes  no vomiting or severe pain  head is stopped up -- ears feel full no drainage  no fever  no colored d/c   falls asleep easily during the day sleeps fine  no sleep apnea  does snore- not too bad  wakes up on her own after 81/2 hours- ready to get up -- then tired after breakfast  doing walking   no cp or sob at all    lost a close friend in may -- was grieving as intensely now   EGD with barretts fall  colon ok in 02 - no abn  quit taking B12-- did not make a difference  diet has not changed   sleep study 15 y ago neg -- for snoring   only goes out early in am - does not suspect heat exhaustion   Allergies: 1)  ! Epinephrine  Review of Systems General:  Complains of loss of appetite; denies chills and fever. Eyes:  Denies blurring, discharge, and eye pain. ENT:  Denies sinus pressure and sore throat. CV:  Denies chest pain or discomfort, palpitations, and shortness of breath with exertion. Resp:  Denies chest pain with inspiration, shortness of breath, and wheezing. GI:  Complains of indigestion; denies bloody stools, change in bowel habits, dark tarry stools, nausea, and vomiting; just did stool cards  regular bms every day - no real change  chronic indigestion is nl  low abd grumbles all the time . GU:  Denies discharge and dysuria. MS:  Denies joint pain. Derm:  Denies itching, lesion(s), poor wound healing,  and rash. Neuro:  Denies numbness and tingling. Psych:  other than grief - mood is about the same . Endo:  Denies cold intolerance, excessive thirst, excessive urination, and heat intolerance. Heme:  Denies abnormal bruising and bleeding.  Physical Exam  General:  Well-developed,well-nourished,in no acute distress; alert,appropriate and cooperative throughout examination Head:  normocephalic, atraumatic, and no abnormalities observed.  no sinus tenderness  Eyes:  vision grossly intact, pupils equal, pupils round, and pupils reactive to light.  no conjunctival pallor, injection or icterus  Ears:  TMS clear bilat  Nose:  nares are boggy but clear  Mouth:  pharynx pink and moist.   Neck:  supple with full rom and no masses or thyromegally, no JVD or carotid bruit  Chest Wall:  No deformities, masses, or tenderness noted. Lungs:  Normal respiratory effort, chest expands symmetrically. Lungs are clear to auscultation, no crackles or wheezes. Heart:  Normal rate and regular rhythm. S1 and S2 normal without gallop, murmur, click, rub or other extra sounds. Abdomen:  Bowel sounds positive,abdomen soft and non-tender without masses, organomegaly or hernias noted. no renal bruits   Pulses:  R and L carotid,radial,femoral,dorsalis pedis and posterior tibial pulses  are full and equal bilaterally Extremities:  No clubbing, cyanosis, edema, or deformity noted with normal full range of motion of all joints.   Neurologic:  sensation intact to light touch, gait normal, and DTRs symmetrical and normal.  no tremor  Skin:  Intact without suspicious lesions or rashes no pallor or jaundice  Cervical Nodes:  No lymphadenopathy noted Inguinal Nodes:  No significant adenopathy Psych:  pt is candid about grief- but not tearful good comm skills and eye contact not seemingly depressed    Impression & Recommendations:  Problem # 1:  FATIGUE (ICD-780.79) Assessment New new- worse past mo some day time  somnolence (? hx of nl sleep study years ago) disc diet/habits  lab today and update if all nl- ? consider sleep ref also disc stress in detail- is coping well without depressives s/s Orders: Venipuncture (45409) TLB-BMP (Basic Metabolic Panel-BMET) (80048-METABOL) TLB-CBC Platelet - w/Differential (85025-CBCD) TLB-Hepatic/Liver Function Pnl (80076-HEPATIC) TLB-TSH (Thyroid Stimulating Hormone) (84443-TSH) TLB-B12 + Folate Pnl (82746_82607-B12/FOL)  Problem # 2:  IRRITABLE BOWEL SYNDROME (ICD-564.1) Assessment: Deteriorated chronic - with gassiness/ nl colonosc 02/ and hx of bad gerd with barretts  trial of citrucel as needed - may help with bowel changes will f/u with GI as needed  update if persistant or worsened abd pain or fever or any new change   Problem # 3:  DYSFUNCTION OF EUSTACHIAN TUBE (ICD-381.81) Assessment: New with ear fullness- nl exam trial of flonase as needed and update  also hx of meniere's   Complete Medication List: 1)  Synthroid 75 Mcg Tabs (Levothyroxine sodium) .Marland Kitchen.. 1 by mouth once daily 2)  Nexium 40 Mg Cpdr (Esomeprazole magnesium) .Marland Kitchen.. 1 by mouth two times a day 3)  Butalbital-apap-caffeine 50-500-40 Mg Tabs (Butalbital-apap-caffeine) .... 2 at onset of headache 1 q4hours prn 4)  Ergotamine-caffeine 1-100 Mg Tabs (Ergotamine-caffeine) .... 2 by mouth as needed headache, then 1 by mouth one hour later prn 5)  Calcium Carbonate Powd (Calcium carbonate) .Marland Kitchen.. 1 by mouth once daily 6)  Vitamin B-12 500 Mcg Tabs (Cyanocobalamin) .Marland Kitchen.. 1 by mouth once daily 7)  Triamcinolone Acetonide 0.025 % Crea (Triamcinolone acetonide) .... Apply to affected area every other day to once daily as needed 8)  Flonase 50 Mcg/act Susp (Fluticasone propionate) .... 2 sprays in each nostril once daily as needed for ear or nasal congestion  Patient Instructions: 1)  labs today - for fatigue  2)  start citrucel fiber supplement -- once daily as directed  3)  make sure to drink  enough water and use caution in heat  4)  I will update you when labs return 5)  if you want to try for ear congestion - flonase nasal spray  Prescriptions: FLONASE 50 MCG/ACT SUSP (FLUTICASONE PROPIONATE) 2 sprays in each nostril once daily as needed for ear or nasal congestion  #1 mdi x 3   Entered and Authorized by:   Judith Part MD   Signed by:   Judith Part MD on 04/02/2009   Method used:   Print then Give to Patient   RxID:   [id number]   Prior Medications (reviewed today): SYNTHROID 75 MCG  TABS (LEVOTHYROXINE SODIUM) 1 by mouth once daily NEXIUM 40 MG  CPDR (ESOMEPRAZOLE MAGNESIUM) 1 by mouth two times a day BUTALBITAL-APAP-CAFFEINE 50-500-40 MG  TABS (BUTALBITAL-APAP-CAFFEINE) 2 at onset of headache 1 q4hours prn ERGOTAMINE-CAFFEINE 1-100 MG  TABS (ERGOTAMINE-CAFFEINE) 2 by mouth as needed headache, then 1 by mouth one hour later prn CALCIUM CARBONATE  POWD (CALCIUM CARBONATE) 1 by mouth once daily VITAMIN B-12 500 MCG  TABS (CYANOCOBALAMIN) 1 by mouth once daily TRIAMCINOLONE ACETONIDE 0.025 % CREA (TRIAMCINOLONE ACETONIDE) apply to affected area every other day to once daily as needed FLONASE 50 MCG/ACT SUSP (FLUTICASONE PROPIONATE) 2 sprays in each nostril once daily as needed for ear or nasal congestion Current Allergies (reviewed today): ! EPINEPHRINE Current Medications (including changes made in today's visit):  SYNTHROID 75 MCG  TABS (LEVOTHYROXINE SODIUM) 1 by mouth once daily NEXIUM 40 MG  CPDR (ESOMEPRAZOLE MAGNESIUM) 1 by mouth two times a day BUTALBITAL-APAP-CAFFEINE 50-500-40 MG  TABS (BUTALBITAL-APAP-CAFFEINE) 2 at onset of headache 1 q4hours prn ERGOTAMINE-CAFFEINE 1-100 MG  TABS (ERGOTAMINE-CAFFEINE) 2 by mouth as needed headache, then 1 by mouth one hour later prn CALCIUM CARBONATE   POWD (CALCIUM CARBONATE) 1 by mouth once daily VITAMIN B-12 500 MCG  TABS (CYANOCOBALAMIN) 1 by mouth once daily TRIAMCINOLONE ACETONIDE 0.025 % CREA (TRIAMCINOLONE  ACETONIDE) apply to affected area every other day to once daily as needed FLONASE 50 MCG/ACT SUSP (FLUTICASONE PROPIONATE) 2 sprays in each nostril once daily as needed for ear or nasal congestion

## 2010-09-20 NOTE — Progress Notes (Signed)
Summary: Meds   Phone Note Call from Patient Call back at Home Phone [phone]   Caller: Patient Call For: Sheryn Bison Reason for Call: Talk to Nurse Details for Reason: Med Summary of Call: Meds not making significant change calling per Dr Maris Berger instructions. Probiotic and Antibiotic, pt stiill has weeks worth of pro biotic should she continue? Initial call taken by: Darryl Lent CMA Duncan Dull),  June 17, 2009 9:26 AM  Follow-up for Phone Call        Pt states symptoms are some better.  Has finished 2 weeks of xifaxin.  Cont's to take align, but still having lower abd pressure and alot of gas, belches alot.  Dr. Jarold Motto mentioned to her that if this rx was not helpful then would check her for celiac.  OK to go ahead and order this test?  (pt would like to have it drawn at [facility] office if possible, much closer to her)   Follow-up by: Ashok Cordia RN,  June 17, 2009 10:03 AM  Additional Follow-up for Phone Call Additional follow up Details #1::        YES,FINE TO DRAW CELIAC PANEL ,AND IG A LEVEL Additional Follow-up by: Peterson Ao,  June 17, 2009 10:25 AM     Appended Document: Meds Talked with Mil[facility] Merry,  Jacki Cones will check and see if labs can be done there.  She will let us know on 06/21/09.  Pt notified.  Appended Document: Meds Pt to go to [facility] to have labs drawn.  [facility] will contact pt.  Drl Patterson to review results.   Clinical Lists Changes  Orders: Added new Test order of T-Sprue Panel (Celiac Disease Aby Eval) (83516x3/[id number]) - Signed Added new Test order of T-igA (35361) - Signed

## 2010-09-20 NOTE — Procedures (Signed)
Summary: EGD   EGD  Procedure date:  07/08/2008  Findings:      Location: [facility]    Patient Name: Sarah Harper, Sarah Harper. MRN:  Procedure Procedures: Panendoscopy (EGD) CPT: 43235.    with biopsy(s)/brushing(s). CPT: D1846139.  Personnel: Endoscopist: Vania Rea. Jarold Motto, MD.  Exam Location: Exam performed in Outpatient Clinic. Outpatient  Patient Consent: Procedure, Alternatives, Risks and Benefits discussed, consent obtained, from patient. Consent was obtained by the RN.  Indications Symptoms: Chest Pain. Reflux symptoms  Comments: Chest pain despite max med Rx. for GERD.... History  Current Medications: Patient is not currently taking Coumadin.  Medical/Surgical History: Hypothyroidism, Headaches, Reflux Disease,  Comments: Normal ultrasound Pre-Exam Physical: Performed Jul 08, 2008  Cardio-pulmonary exam, Abdominal exam, Extremity exam, Mental status exam WNL.  Comments: Pt. history reviewed/updated, physical exam performed prior to initiation of sedation? yes Exam Exam Info: Maximum depth of insertion Duodenum, intended Duodenum. Patient position: on left side. Duration of exam: 10 minutes. Vocal cords visualized. Gastric retroflexion performed. Images taken. ASA Classification: II. Tolerance: excellent.  Sedation Meds: Patient assessed and found to be appropriate for moderate (conscious) sedation. Sedation was managed by the Endoscopist. Fentanyl 50 mcg. given IV. Versed 4 mg. given IV. Cetacaine Spray 2 sprays given aerosolized.  Monitoring: BP and pulse monitoring done. Oximetry used. Supplemental O2 given at 2 Liters.  Instrument(s): GIF 160. Serial [id number].   Findings - Normal: Proximal Esophagus to Mid Esophagus. Not Seen: Tumor. Esophageal inflammation. Foreign body. Varices.  - HIATAL HERNIA: 3 cms. in length. ICD9: Hernia, Hiatal: 553.3. - BARRETT'S ESOPHAGUS: Length of Barrett's 3 cm. No inflammation present. Biopsy/Barrett's  taken. ICD9: Barrett's: 530.2.  - Normal: Duodenal Bulb to Duodenal 2nd Portion. Not Seen: Ulcer. Mucosal abnormality.  - Normal: Fundus to Pyloric Sphincter. Comments: Scattered htperplastic nodules....   Assessment  Diagnoses: 530.2: Barrett's.  553.3: Hernia, Hiatal. GERD.   Events  Unplanned Intervention: No unplanned interventions were required.  Plans Medication(s): Await pathology. Continue current medications.  Patient Education: Patient given standard instructions for: Barrett's. Reflux.  Disposition: After procedure patient sent to recovery. After recovery patient sent home.  Scheduling: Await pathology to schedule patient. Follow-up prn.    cc: Roxy Manns, MD   MRN: [id number] Pathologist: Beulah Gandy. Luisa Hart, MD DOB/Age  30-Mar-1941 (Age: 70)    Gender: F Date Taken:  07/08/2008 Date Received: 07/08/2008   FINAL DIAGNOSIS   ***MICROSCOPIC EXAMINATION AND DIAGNOSIS***   ESOPHAGUS, BIOPSIES:  GASTROESOPHAGEAL JUNCTION MUCOSA WITH MILD INFLAMMATION CONSISTENT WITH GASTROESOPHAGEAL REFLUX.  NO INTESTINAL METAPLASIA, DYSPLASIA OR MALIGNANCY IDENTIFIED.   COMMENT An Alcian Blue stain is performed to determine the presence of intestinal metaplasia (goblet cell metaplasia). No intestinal metaplasia (goblet cell metaplasia) is identified with the Alcian Blue stain. The control stained appropriately.   gdt Date Reported:  07/09/2008     Beulah Gandy. Luisa Hart, MD *** Electronically Signed Out By JDP ***   Clinical information GERD, nausea, rule out Barrett' s esophagus. (mj)   specimen(s) obtained Esophagus, biopsy, distal   Gross Description Received in formalin are tan, soft tissue fragments that are submitted in toto.  Number:  Six            Size:  0.2 to 0.3 cm (SW:mj 07/08/08)   mj/     Signed by Mardella Layman MD FACG,FAGA on 07/10/2008 at 8:43 AM    July 10, 2008 MRN: [id number]    [facility] [address] [place] [place], [place]  [address]  Dear Ms. Darliss Cheney,  I am pleased to inform you that the biopsies taken during your recent endoscopic examination did not show any evidence of cancer upon pathologic examination.  Additional information/recommendations:  __No further action is needed at this time.  Please follow-up with      your primary care physician for your other healthcare needs.  __ Please call [phone] to schedule a return visit to review      your condition.  xx__ Continue with the treatment plan as outlined on the day of your      exam.  __ You should have a repeat endoscopic examination for thi_s problem              in _ months/years.   Please call us if you are having persistent problems or have questions about your condition that have not been fully answered at this time.  Sincerely,  Mardella Layman MD [facility]  This letter has been electronically signed by your physician.   This report was created from the original endoscopy report, which was reviewed and signed by the above listed endoscopist.    Appended Document: EGD     Clinical Lists Changes  Observations: Added new observation of PAST SURG HX: L5 discectomy L TM repair BTL, appy  [facility] - [place] 02 dexa nl 99 colonosc nl 99 EGD GERD 02 abd [place] neg 02 EGD- chroinic GERD and barretts 02  colonosc neg 05 dexa nl  EGD HH, [place] neg 11/09 EGD barretts, HH, GERD 11/09 gastric emptying scan nl  (07/14/2008 20:54)       Past Surgical History:    L5 discectomy    L TM repair    BTL, appy     graves - [place]    02 dexa nl    99 colonosc nl    99 EGD GERD    02 abd [place] neg    02 EGD- chroinic GERD and barretts    02  colonosc neg    05 dexa nl     EGD HH, [place] neg    11/09 EGD barretts, HH, GERD    11/09 gastric emptying scan nl

## 2010-09-20 NOTE — Progress Notes (Signed)
Summary: results   Phone Note Call from Patient Call back at Home Phone [phone]   Caller: Patient Call For: Jarold Motto Reason for Call: Talk to Nurse, Lab or Test Results Summary of Call: Patient wants lab results from last week Initial call taken by: Tawni Levy,  June 28, 2009 8:27 AM  Follow-up for Phone Call        Talked with pt.  Informed celiac test negative.  Pt states she is doing some better.  She is going to stay on the align for a while.  She feels this was helpful.  Pt will report back if symptoms worsen.  Pt asks that Dr. Jarold Motto is made aware of this.  Any futher instructions? Follow-up by: Ashok Cordia RN,  June 28, 2009 8:51 AM  Additional Follow-up for Phone Call Additional follow up Details #1::        noted by Dr. Jarold Motto Additional Follow-up by: Ashok Cordia RN,  July 05, 2009 2:35 PM

## 2010-09-20 NOTE — Procedures (Signed)
Summary: EGD   EGD  Procedure date:  06/22/2004  Findings:      Location: [facility]    Patient Name: Sarah Harper, Sarah Harper MRN:  Procedure Procedures: Panendoscopy (EGD) CPT: 43235.  Personnel: Endoscopist: Vania Rea. Jarold Motto, MD.  Exam Location: Exam performed in Outpatient Clinic. Outpatient  Patient Consent: Procedure, Alternatives, Risks and Benefits discussed, consent obtained, from patient. Consent was obtained by the RN.  Indications Symptoms: Chest Pain. Early satiety. Reflux symptoms  History  Current Medications: Patient is not currently taking Coumadin.  Pre-Exam Physical: Performed Jun 22, 2004  Cardio-pulmonary exam, Abdominal exam, Extremity exam, Mental status exam WNL.  Exam Exam Info: Maximum depth of insertion Duodenum, intended Duodenum. Patient position: on left side. Duration of exam: 15 minutes. Vocal cords visualized. Gastric retroflexion performed. Images taken. ASA Classification: I. Tolerance: excellent.  Sedation Meds: Patient assessed and found to be appropriate for moderate (conscious) sedation. Fentanyl 50 mcg. given IV. Versed 5 mg. given IV. Cetacaine Spray 2 sprays given aerosolized.  Monitoring: BP and pulse monitoring done. Oximetry used. Supplemental O2 given at 2 Liters.  Instrument(s): GIF 160. Serial [id number].   Findings - HIATAL HERNIA: Prolapsing, 2 cms. in length. ICD9: Hernia, Hiatal: 553.3. - Normal: Proximal Esophagus to Distal Esophagus. Not Seen: Tumor. Barrett's esophagus. Esophageal inflammation. Varices.  - Normal: Body to Duodenal 2nd Portion. Tumor. Ulcer. Mucosal abnormality. AVM's. Foreign body.   Assessment  Diagnoses: 553.3: Hernia, Hiatal.   Comments: She has known idiopathic gastroparesis.Ultrasound done recently was WNL also. Events  Unplanned Intervention: No unplanned interventions were required.  Plans Medication(s): Continue current medications.  Patient Education: Patient  given standard instructions for: Reflux.  Disposition: After procedure patient sent to recovery. After recovery patient sent home.  Scheduling: Clinic Visit, to Vania Rea. Jarold Motto, MD, around Jul 22, 2004.    CC: Roxy Manns, MD  This report was created from the original endoscopy report, which was reviewed and signed by the above listed endoscopist.

## 2010-09-20 NOTE — Letter (Signed)
Summary: Results Follow up Letter  [place] [facility]  [address] [place], [place] [address]   Phone: [phone]  Fax: [phone]    12/22/2009 MRN: [id number]    [facility] [address] [place] [place], [place]  [address]    Dear Ms. Sarah Harper,  The following are the results of your recent test(s):  Test         Result    Pap Smear:        Normal __X___  Not Normal _____ Comments: ______________________________________________________ Cholesterol: LDL(Bad cholesterol):         Your goal is less than:         HDL (Good cholesterol):       Your goal is more than: Comments:  ______________________________________________________ Mammogram:        Normal _____  Not Normal _____ Comments:  ___________________________________________________________________ Hemoccult:        Normal _____  Not normal _______ Comments:    _____________________________________________________________________ Other Tests:    We routinely do not discuss normal results over the telephone.  If you desire a copy of the results, or you have any questions about this information we can discuss them at your next office visit.   Sincerely,    Marne A. Milinda Antis, M.D.  MAT:lsf

## 2010-09-20 NOTE — Assessment & Plan Note (Signed)
Summary: ESOPHAGUS FLARE UP/FH    History of Present Illness Visit Type: new patient Primary GI MD: Sheryn Bison MD FACP FAGA Primary Provider: Roxy Manns MD Chief Complaint: f/u Genella Rife History of Present Illness:   This 70 year old white female had worsening burning substernal chest pain pain or subxiphoid area of the last several weeks despite taking Nexium 40 mg twice a day and Reglan 5 mg twice a day for suspected gastroparesis. Have not seen her in several years and last endoscopic exam was in 2005. She is up-to-date on colonoscopy exam. She has a mild tremor from her Reglan but no other neurological problems. Previous ultrasound exam is normal. She did have an abnormal gastric emptying scan in October 2004 documented. She's had no anorexia, weight loss, melena, hematochezia, with specific food intolerances or lower gastrointestinal problems or history of hepatitis or pancreatitis. She does not abuse alcohol, cigarettes, or NSAIDs.   GI Review of Systems    Reports acid reflux, chest pain, and  heartburn.      Denies abdominal pain, dysphagia with liquids, dysphagia with solids, loss of appetite, nausea, vomiting, vomiting blood, and  weight loss.        Denies anal fissure, black tarry stools, change in bowel habit, constipation, diarrhea, diverticulosis, fecal incontinence, heme positive stool, hemorrhoids, irritable bowel syndrome, jaundice, light color stool, liver problems, rectal bleeding, and  rectal pain.     Prior Medications Reviewed Using: Patient Recall  Updated Prior Medication List: SYNTHROID 75 MCG  TABS (LEVOTHYROXINE SODIUM) 1 once daily REGLAN 5 MG  TABS (METOCLOPRAMIDE HCL) 1 three times a day ac meals NEXIUM 40 MG  CPDR (ESOMEPRAZOLE MAGNESIUM) 1 by mouth two times a day BUTALBITAL-APAP-CAFFEINE 50-500-40 MG  TABS (BUTALBITAL-APAP-CAFFEINE) 2 at onset of headache 1 q4hours prn ERGOTAMINE-CAFFEINE 1-100 MG  TABS (ERGOTAMINE-CAFFEINE) 2 by mouth as needed  headache, then 1 by mouth one hour later prn CALCIUM CARBONATE   POWD (CALCIUM CARBONATE) 1 by mouth once daily VITAMIN B-12 500 MCG  TABS (CYANOCOBALAMIN) 1 by mouth once daily  Current Allergies (reviewed today): ! EPINEPHRINE  Past Medical History:    Reviewed history from 12/05/2007 and no changes required:       GERD        hypothyroid (past RI)       depression       IBS       ETD       Meniere's disease       idiopathic gastroparesis       HH Migraine                             derm- Tafeen       ENT---[facility]       GI---[facility]       sx --[facility]         Past Surgical History:    Reviewed history from 12/05/2007 and no changes required:       L5 discectomy       L TM repair       BTL, appy        graves - RI       02 dexa nl       99 colonosc nl       99 EGD GERD       02 abd [place] neg       02 EGD- chroinic GERD and barretts       02  colonosc neg       05 dexa nl        EGD HH, [place] neg   Family History:    Reviewed history from 12/05/2007 and no changes required:       P aunt with BCA and DM       M grandparents HTN  Social History:    Reviewed history from 12/05/2007 and no changes required:       never smoked       drinks wine 1 glass daily        walking for exercise     Review of Systems  The patient denies allergy/sinus, anemia, anxiety-new, arthritis/joint pain, back pain, blood in urine, breast changes/lumps, change in vision, confusion, cough, coughing up blood, depression-new, fainting, fatigue, fever, headaches-new, hearing problems, heart murmur, heart rhythm changes, itching, menstrual pain, muscle pains/cramps, night sweats, nosebleeds, pregnancy symptoms, shortness of breath, skin rash, sleeping problems, sore throat, swelling of feet/legs, swollen lymph glands, thirst - excessive , urination - excessive , urination changes/pain, urine leakage, vision changes, and voice change.     Vital Signs:  Patient Profile:   70 Years Old  Female Height:     67 inches Weight:      133.25 pounds BMI:     20.95 Pulse rate:   78 / minute Pulse rhythm:   regular BP sitting:   120 / 70  (left arm)  Vitals Entered By: Chales Abrahams CMA (June 30, 2008 1:19 PM)                  Physical Exam  General:     Well developed, well nourished, no acute distress.healthy appearing.   Head:     Normocephalic and atraumatic. Eyes:     PERRLA, no icterus.exam deferred to patient's ophthalmologist.   Lungs:     Clear throughout to auscultation. Heart:     Regular rate and rhythm; no murmurs, rubs,  or bruits. Abdomen:     Soft, nontender and nondistended. No masses, hepatosplenomegaly or hernias noted. Normal bowel sounds. Msk:     Symmetrical with no gross deformities. Normal posture. Extremities:     No clubbing, cyanosis, edema or deformities noted. Neurologic:     Alert and  oriented x4;  grossly normal neurologically. Psych:     Alert and cooperative. Normal mood and affect.    Impression & Recommendations:  Problem # 1:  GERD (ICD-530.81) Assessment: Deteriorated It is unclear as to why she would have worsening acid reflux at this time I have scheduled her for endoscopic exam and ultrasound exam and will continue her current regime and add Carafate suspension after meals 3 times a day. Orders: EGD (EGD)   Problem # 2:  SPECIAL SCREENING MALIG NEOPLASMS OTHER SITES (ICD-V76.49) Assessment: Unchanged colonoscopy exam due in 2012. She has no family history of colon cancer and a previous colonoscopy was negative.  Problem # 3:  HYPOTHYROIDISM (ICD-244.9) Assessment: Unchanged continue other medications per Dr. Milinda Antis.  Other Orders: Ultrasound Abdomen (UAS)   Patient Instructions: 1)  Copy Sent To:Dr. Tower 2)  Please Continue current medications. 3)  Avoid foods high in acid content (tomatoes, citrus juices, spicy foods). Avoid eating within 3 to 4 hours of lying down or before exercising. Do not over  eat; try smaller more frequent meals. Elevate head of bed four inches when sleeping. 4)  Carafate suspension 1 tablespoon after meals 3 times a day 5)  We will discontinue Reglan since she  does symptomatically. She needs a prokinetic agent we'll prescribe him domperidone 10 mg t.i.d. instead of Reglan. 6)  [facility] Patient Information Guide given to patient.    Prescriptions: CARAFATE 1 GM/10ML  SUSP (SUCRALFATE) take 10 cc after meals  #1200 x 1   Entered by:   Harlow Mares CMA   Authorized by:   Mardella Layman MD FACG,FAGA   Signed by:   Harlow Mares CMA on 06/30/2008   Method used:   Electronically to        [facility]* (retail)       [id number]       [place], [place]  [address]       Ph: [phone]       Fax: [phone]   RxID:   [id number]  ]

## 2010-09-20 NOTE — Assessment & Plan Note (Signed)
Summary: CPX/BIR   Vital Signs:  Patient profile:   70 year old female Height:      65.25 inches Weight:      129 pounds BMI:     21.38 Temp:     97.9 degrees F oral Pulse rate:   84 / minute Pulse rhythm:   regular BP sitting:   120 / 76  (left arm) Cuff size:   regular  Vitals Entered By: Liane Comber (December 10, 2008 9:52 AM)  History of Present Illness: has been ok - still a lot of problems with GERD tried 3 other PPIs- not more helpful with nexium takes carafate on and off  annoying epigastric pain - continues to get uncomforable  EGD was overall ok    wt is down 2 lb- is eating ok overall  is walking regularly   Td utd 03  pap 3/08 nl  no previous surg ,no gyn symptoms at all  no new sexual parnter   mam nl 10 /09  dexa nl in 05-- has been 5 years  ca and vit D- is good about ca and vit D   02 colonosc - nl ,due in 2012    fairly good lipids last year Last Lipid ProfileCholesterol: 216 (12/10/2007 9:39:00 AM)HDL:  78.5 (12/10/2007 9:39:00 AM)LDL:  118 (12/10/2007 9:39:00 AM)Triglycerides:  Last Liver profileSGOT:  22 (11/01/2006 10:29:00 AM)SPGT:  18 (11/01/2006 10:29:00 AM)T. Bili:  0.9 (11/01/2006 10:29:00 AM)Alk Phos:  70 (11/01/2006 10:29:00 AM)  diet is pretty good - avoids fats and has to eat with him   feels like thyroid is stable     Allergies: 1)  ! Epinephrine  Past History:  Past Medical History:    GERD     hypothyroid (past RI)    depression    IBS    ETD    Meniere's disease    idiopathic gastroparesis    HH Migraine     derm- Tafeen    ENT---[facility]    GI---[facility]    sx --[facility]     (12/05/2007)  Past Surgical History:    L5 discectomy    L TM repair    BTL, appy     graves - RI    02 dexa nl    99 colonosc nl    99 EGD GERD    02 abd [place] neg    02 EGD- chroinic GERD and barretts    02  colonosc neg    05 dexa nl     EGD HH, [place] neg    11/09 EGD barretts, HH, GERD    11/09 gastric emptying scan nl  (07/14/2008)  Family History:    P aunt with BCA and DM    M grandparents HTN     (12/05/2007)  Social History:    never smoked    drinks wine 1 glass daily     walking for exercise  (12/05/2007)  Review of Systems General:  Denies fatigue, fever, loss of appetite, and malaise. Eyes:  Denies eye irritation and itching. ENT:  Denies sore throat. CV:  Denies chest pain or discomfort, palpitations, and shortness of breath with exertion. Resp:  Denies cough and wheezing. GI:  Complains of abdominal pain and indigestion; denies change in bowel habits and diarrhea. GU:  Denies abnormal vaginal bleeding, discharge, and dysuria. MS:  Complains of joint pain and stiffness. Derm:  Denies itching, lesion(s), and rash. Neuro:  Denies numbness and tingling. Psych:  mood is generally god. Endo:  Denies excessive thirst and excessive urination.  Physical Exam  General:  Well-developed,well-nourished,in no acute distress; alert,appropriate and cooperative throughout examination Head:  normocephalic, atraumatic, and no abnormalities observed.   Eyes:  vision grossly intact, pupils equal, pupils round, and pupils reactive to light.  no conjunctival pallor, injection or icterus  Ears:  R ear normal and L ear normal.   Nose:  no nasal discharge.   Mouth:  pharynx pink and moist.   Neck:  supple with full rom and no masses or thyromegally, no JVD or carotid bruit  Chest Wall:  No deformities, masses, or tenderness noted. Breasts:  No mass, nodules, thickening, tenderness, bulging, retraction, inflamation, nipple discharge or skin changes noted.   Lungs:  Normal respiratory effort, chest expands symmetrically. Lungs are clear to auscultation, no crackles or wheezes. Heart:  Normal rate and regular rhythm. S1 and S2 normal without gallop, murmur, click, rub or other extra sounds. Abdomen:  Bowel sounds positive,abdomen soft and non-tender without masses, organomegaly or hernias noted. no renal bruits   Msk:  No deformity or scoliosis noted of thoracic or lumbar spine.  no acute joint changes  Pulses:  R and L carotid,radial,femoral,dorsalis pedis and posterior tibial pulses are full and equal bilaterally Extremities:  No clubbing, cyanosis, edema, or deformity noted with normal full range of motion of all joints.   Neurologic:  sensation intact to light touch, gait normal, and DTRs symmetrical and normal.   Skin:  Intact without suspicious lesions or rashes some mildy hyperemia/resolving rash back of calves  Cervical Nodes:  No lymphadenopathy noted Axillary Nodes:  No palpable lymphadenopathy Inguinal Nodes:  No significant adenopathy Psych:  normal affect, talkative and pleasant    Impression & Recommendations:  Problem # 1:  GERD (ICD-530.81) Assessment Deteriorated continue two times a day ppi and f/u GI  recent EGD- no changes  ? consider tertiary care ref if not imp The following medications were removed from the medication list:    Carafate 1 Gm/34ml Susp (Sucralfate) .Marland Kitchen... Take 10 cc after meals    Aciphex 20 Mg Tbec (Rabeprazole sodium) .Marland Kitchen... Take 1 each day 30 minutes before meals    Protonix 40 Mg Tbec (Pantoprazole sodium) .Marland Kitchen... 1 each day 30 minutes before meal    Kapidex 30 Mg Cpdr (Dexlansoprazole) .Marland Kitchen... Take one capsule by mouth 30 minutes before breakfast Her updated medication list for this problem includes:    Nexium 40 Mg Cpdr (Esomeprazole magnesium) .Marland Kitchen... 1 by mouth two times a day  Orders: Venipuncture (16109) TLB-Renal Function Panel (80069-RENAL) TLB-TSH (Thyroid Stimulating Hormone) (84443-TSH) TLB-CBC Platelet - w/Differential (85025-CBCD) TLB-Hepatic/Liver Function Pnl (80076-HEPATIC)  Problem # 2:  SKIN RASH (ICD-782.1) Assessment: New on back of legs - likely contact derm resolving with otc cortisone cream  update if not imp  Problem # 3:  HYPOTHYROIDISM (ICD-244.9) Assessment: Unchanged  has been stable clinically on this dose labs today  and update Her updated medication list for this problem includes:    Synthroid 75 Mcg Tabs (Levothyroxine sodium) .Marland Kitchen... 1 by mouth once daily  Orders: Venipuncture (60454) TLB-Renal Function Panel (80069-RENAL) TLB-TSH (Thyroid Stimulating Hormone) (84443-TSH) TLB-CBC Platelet - w/Differential (85025-CBCD) TLB-Hepatic/Liver Function Pnl (80076-HEPATIC)  Labs Reviewed: TSH: 2.40 (11/01/2006)    Chol: 216 (12/10/2007)   HDL: 78.5 (12/10/2007)   LDL: DEL (12/10/2007)   TG: 55 (12/10/2007)  Problem # 4:  POSTMENOPAUSAL STATUS (ICD-V49.81) Assessment: Comment Only this with petit frame and PPI inc riskforOP sched dexa rev rec for ca and D  Orders: Radiology Referral (Radiology)  Problem # 5:  Preventive Health Care (ICD-V70.0) Assessment: Comment Only rev health mt list  does not want pneumovax or zostavax at this time  Complete Medication List: 1)  Synthroid 75 Mcg Tabs (Levothyroxine sodium) .Marland Kitchen.. 1 by mouth once daily 2)  Nexium 40 Mg Cpdr (Esomeprazole magnesium) .Marland Kitchen.. 1 by mouth two times a day 3)  Butalbital-apap-caffeine 50-500-40 Mg Tabs (Butalbital-apap-caffeine) .... 2 at onset of headache 1 q4hours prn 4)  Ergotamine-caffeine 1-100 Mg Tabs (Ergotamine-caffeine) .... 2 by mouth as needed headache, then 1 by mouth one hour later prn 5)  Calcium Carbonate Powd (Calcium carbonate) .Marland Kitchen.. 1 by mouth once daily 6)  Vitamin B-12 500 Mcg Tabs (Cyanocobalamin) .Marland Kitchen.. 1 by mouth once daily  Patient Instructions: 1)  we will schedule dexa at check out  2)  labs today 3)  the current recommendation for calcium intake is 1200-1500 mg daily with [phone] IU of vitamin D  4)  let me know in future if you are interested in shingles vaccine  5)  let me k now in future if you are interested in pneumonia vaccine  Prescriptions: ERGOTAMINE-CAFFEINE 1-100 MG  TABS (ERGOTAMINE-CAFFEINE) 2 by mouth as needed headache, then 1 by mouth one hour later prn  #30 x 0   Entered and Authorized by:    Judith Part MD   Signed by:   Judith Part MD on 12/10/2008   Method used:   Print then Give to Patient   RxID:   [id number] NEXIUM 40 MG  CPDR (ESOMEPRAZOLE MAGNESIUM) 1 by mouth two times a day  #180 x 3   Entered and Authorized by:   Judith Part MD   Signed by:   Judith Part MD on 12/10/2008   Method used:   Print then Give to Patient   RxID:   [id number] SYNTHROID 75 MCG  TABS (LEVOTHYROXINE SODIUM) 1 by mouth once daily  #90 x 3   Entered and Authorized by:   Judith Part MD   Signed by:   Judith Part MD on 12/10/2008   Method used:   Print then Give to Patient   RxID:   [id number]       Prior Medications (reviewed today): BUTALBITAL-APAP-CAFFEINE 50-500-40 MG  TABS (BUTALBITAL-APAP-CAFFEINE) 2 at onset of headache 1 q4hours prn ERGOTAMINE-CAFFEINE 1-100 MG  TABS (ERGOTAMINE-CAFFEINE) 2 by mouth as needed headache, then 1 by mouth one hour later prn CALCIUM CARBONATE   POWD (CALCIUM CARBONATE) 1 by mouth once daily VITAMIN B-12 500 MCG  TABS (CYANOCOBALAMIN) 1 by mouth once daily Current Allergies (reviewed today): ! EPINEPHRINE Current Medications (including changes made in today's visit):  SYNTHROID 75 MCG  TABS (LEVOTHYROXINE SODIUM) 1 by mouth once daily NEXIUM 40 MG  CPDR (ESOMEPRAZOLE MAGNESIUM) 1 by mouth two times a day BUTALBITAL-APAP-CAFFEINE 50-500-40 MG  TABS (BUTALBITAL-APAP-CAFFEINE) 2 at onset of headache 1 q4hours prn ERGOTAMINE-CAFFEINE 1-100 MG  TABS (ERGOTAMINE-CAFFEINE) 2 by mouth as needed headache, then 1 by mouth one hour later prn CALCIUM CARBONATE   POWD (CALCIUM CARBONATE) 1 by mouth once daily VITAMIN B-12 500 MCG  TABS (CYANOCOBALAMIN) 1 by mouth once daily

## 2010-09-20 NOTE — Progress Notes (Signed)
Summary: Lab result requested by [facility]  Phone Note From Other Clinic Call back at [phone] fax#   Caller: Archie Patten at [employer] of Call: Tonya request pts creatinine faxed to [phone] in preparation of testing [facility] is ordering. Faxed as requested the 12/17/09 lab which included creatinine.Lewanda Rife LPN  Dec 30, 2009 2:47 PM

## 2010-09-20 NOTE — Progress Notes (Signed)
Summary: ergotamine  Phone Note Refill Request Message from:  Patient on October 26, 2009 2:19 PM  Refills Requested: Medication #1:  ERGOTAMINE-CAFFEINE 1-100 MG  TABS 2 by mouth as needed headache Patient would like written script for 90 day supply to send to [facility]. Please call patient when ready for pick up.    Method Requested: Pick up at Office Initial call taken by: Melody Comas,  October 26, 2009 2:20 PM  Follow-up for Phone Call        how many does she generally use or need per mo? Follow-up by: Judith Part MD,  October 26, 2009 2:34 PM  Additional Follow-up for Phone Call Additional follow up Details #1::        Pt said she usually takes 10 pills per month. Pt can get #90 from [facility] for what she pays #30 at [facility]. Pt request rx for #90.Please advise. Lewanda Rife LPN  October 26, 1912 3:20 PM   I do not feel comfortable doing 77 of it in light of pain med -- and also not using that many (they would expire before she used all of them) 3 mo supply is roughly 30 pills -- will have to stick to that  printed in put in nurse in box for pickup  Additional Follow-up by: Judith Part MD,  October 26, 2009 3:43 PM    Additional Follow-up for Phone Call Additional follow up Details #2::    Patient notified as instructed by telephone. Prescription left at front desk. Lewanda Rife LPN  October 27, 7827 4:17 PM   Prescriptions: ERGOTAMINE-CAFFEINE 1-100 MG  TABS (ERGOTAMINE-CAFFEINE) 2 by mouth as needed headache, then 1 by mouth one hour later prn  #30 x 3   Entered and Authorized by:   Judith Part MD   Signed by:   Judith Part MD on 10/26/2009   Method used:   Print then Give to Patient   RxID:   [id number]

## 2010-09-20 NOTE — Assessment & Plan Note (Signed)
Summary: ALOT OF BLOATING & ABD DISCOMFORT/YF    History of Present Illness Visit Type: follow up Primary GI MD: Sheryn Bison MD FACP FAGA Primary Provider: Roxy Manns MD Requesting Provider: n/a Chief Complaint: this History of Present Illness:   This patient is a 70 year old white female who comes off today for abdominal gas, bloating, but no change in bowel habits, melena or hematochezia. She has chronic unexplained chest pain felt secondary to possible acid reflux but she's responded to no PPI therapy. Previously she did have mild response to Reglan but developed a tremor. She has known IBS and chronic headaches. Her upper GI complaints are treated with daily Nexium, p.r.n. and Carafate, p.r.n. antacids. She been on PPI therapy for many years.  She gives no symptoms of malabsorption and no systemic complaints wrap regular complaints. She is hypothyroid on replacement therapy she takes calcium supplementation for osteopenia.   GI Review of Systems    Reports acid reflux, belching, bloating, and  nausea.      Denies abdominal pain, chest pain, dysphagia with liquids, dysphagia with solids, heartburn, loss of appetite, vomiting, vomiting blood, weight loss, and  weight gain.        Denies anal fissure, black tarry stools, change in bowel habit, constipation, diarrhea, diverticulosis, fecal incontinence, heme positive stool, hemorrhoids, irritable bowel syndrome, jaundice, light color stool, liver problems, rectal bleeding, and  rectal pain.    Current Medications (verified): 1)  Synthroid 75 Mcg  Tabs (Levothyroxine Sodium) .Marland Kitchen.. 1 By Mouth Once Daily 2)  Nexium 40 Mg  Cpdr (Esomeprazole Magnesium) .Marland Kitchen.. 1 By Mouth Two Times A Day 3)  Butalbital-Apap-Caffeine 50-500-40 Mg  Tabs (Butalbital-Apap-Caffeine) .... 2 At Onset of Headache 1 Q4hours Prn 4)  Ergotamine-Caffeine 1-100 Mg  Tabs (Ergotamine-Caffeine) .... 2 By Mouth As Needed Headache, Then 1 By Mouth One Hour Later Prn 5)   Calcium Carbonate   Powd (Calcium Carbonate) .Marland Kitchen.. 1 By Mouth Once Daily 6)  Vitamin B-12 500 Mcg  Tabs (Cyanocobalamin) .Marland Kitchen.. 1 By Mouth Once Daily 7)  Triamcinolone Acetonide 0.025 % Crea (Triamcinolone Acetonide) .... Apply To Affected Area Every Other Day To Once Daily As Needed 8)  Carafate 1 Gm/28ml Susp (Sucralfate) .... One Tablespoon After Meals As Needed  Allergies (verified): 1)  ! Epinephrine  Past History:  Past medical, surgical, family and social histories (including risk factors) reviewed for relevance to current acute and chronic problems.  Past Medical History: Reviewed history from 02/01/2009 and no changes required. GERD  hypothyroid (past RI) depression IBS ETD Meniere's disease idiopathic gastroparesis HH Migraine  osteopenia    derm- [facility]---[facility]---Shadd Dunstan sx --[facility]  Past Surgical History: Reviewed history from 02/01/2009 and no changes required. L5 discectomy L TM repair BTL, appy  [facility] - [place] 02 dexa nl 99 colonosc nl 99 EGD GERD 02 abd [place] neg 02 EGD- chroinic GERD and barretts 02  colonosc neg 05 dexa nl  EGD HH, [place] neg 11/09 EGD barretts, HH, GERD 11/09 gastric emptying scan nl dexa 6/10 osteopenia   Family History: Reviewed history from 12/05/2007 and no changes required. P aunt with BCA and DM M grandparents HTN No FH of Colon Cancer:  Social History: Reviewed history from 12/05/2007 and no changes required. never smoked drinks wine 1 glass daily  walking for exercise  Occupation: Retired  Daily Caffeine Use: One cup of coffee in the morning, and once a week 4oz of coke Illicit Drug Use - no Drug Use:  no  Review  of Systems  The patient denies allergy/sinus, anemia, anxiety-new, arthritis/joint pain, back pain, blood in urine, breast changes/lumps, change in vision, confusion, cough, coughing up blood, depression-new, fainting, fatigue, fever, headaches-new, hearing problems, heart murmur, heart rhythm  changes, itching, menstrual pain, muscle pains/cramps, night sweats, nosebleeds, pregnancy symptoms, shortness of breath, skin rash, sleeping problems, sore throat, swelling of feet/legs, swollen lymph glands, thirst - excessive , urination - excessive , urination changes/pain, urine leakage, vision changes, and voice change.    Vital Signs:  Patient profile:   70 year old female Height:      65.25 inches Weight:      133 pounds BMI:     22.04 BSA:     1.67 Pulse rate:   68 / minute Pulse rhythm:   regular BP sitting:   118 / 74  (left arm) Cuff size:   regular  Vitals Entered By: Ok Anis CMA (May 27, 2009 10:22 AM)  Physical Exam  General:  Well developed, well nourished, no acute distress.healthy appearing.   Head:  Normocephalic and atraumatic. Eyes:  PERRLA, no icterus.exam deferred to patient's ophthalmologist.   Neck:  Supple; no masses or thyromegaly. Lungs:  Clear throughout to auscultation. Heart:  Regular rate and rhythm; no murmurs, rubs,  or bruits. Abdomen:  Soft, nontender and nondistended. No masses, hepatosplenomegaly or hernias noted. Normal bowel sounds.There is no distention but there are very loud and active bowel sounds noted Rectal:  deferred Pulses:  Normal pulses noted. Extremities:  No clubbing, cyanosis, edema or deformities noted. Neurologic:  Alert and  oriented x4;  grossly normal neurologically. Cervical Nodes:  No significant cervical adenopathy. Inguinal Nodes:  No significant inguinal adenopathy. Psych:  Alert and cooperative. Normal mood and affect.   Impression & Recommendations:  Problem # 1:  ABDOMINAL PAIN, UNSPECIFIED SITE (ICD-789.00) Assessment Deteriorated I suspect she has an element of bacterial overgrowth syndrome associated with reservoir gas ambulating at this time. I've decided to empirically treat her with Xifaxan 550 mg twice a day for 10 days along with Align probiotic therapy. Celiac sprue panel has been ordered also. I  will see her back in several weeks' time for followup.  Problem # 2:  CHEST PAIN (ICD-786.50) Assessment: Unchanged she probably will need 24-hour pH probe testing and esophageal manometry.  Problem # 3:  IRRITABLE BOWEL SYNDROME (ICD-564.1) Assessment: Comment Only  Patient Instructions: 1)  Copy sent to : Dr. Milinda Antis 2)  Please continue current medications.  3)  empiric treatment for possible bacterial overgrowth syndrome 4)  Celiac panel ordered  Appended Document: ALOT OF BLOATING & ABD DISCOMFORT/YF    Clinical Lists Changes  Medications: Added new medication of XIFAXAN 550 MG TABS (RIFAXIMIN) 1 by mouth two times a day - Signed Rx of XIFAXAN 550 MG TABS (RIFAXIMIN) 1 by mouth two times a day;  #20 x 0;  Signed;  Entered by: Ashok Cordia RN;  Authorized by: Mardella Layman MD [facility];  Method used: Electronically to [facility]*, , ,   , Ph: [phone], Fax: [phone]    Prescriptions: XIFAXAN 550 MG TABS (RIFAXIMIN) 1 by mouth two times a day  #20 x 0   Entered by:   Ashok Cordia RN   Authorized by:   Mardella Layman MD [facility]   Signed by:   Ashok Cordia RN on 05/27/2009   Method used:   Electronically to        [facility]* (mail-order)             ,  Ph: [phone]       Fax: [phone]   RxID:   [id number]    Appended Document: ALOT OF BLOATING & ABD DISCOMFORT/YF    Clinical Lists Changes  Medications: Added new medication of ALIGN   CAPS (MISC INTESTINAL FLORA REGULAT) Take one capsule by mouth daily

## 2010-09-20 NOTE — Assessment & Plan Note (Signed)
Summary: CK UP/HEA   Vital Signs:  Patient Profile:   70 Years Old Female Weight:      137.50 pounds Temp:     98 degrees F oral Pulse rate:   68 / minute BP sitting:   98 / 64  (left arm)  Vitals Entered By: Wandra Mannan (December 05, 2007 9:45 AM)                 Chief Complaint:  check up.  History of Present Illness: has had a good year overall  gaining wt over low abd- feels bloated   good diet and exercise- does walk for exercise  mam utd and no breast lumps on self exam  no gyn symptoms at all  still has uterus and ovaries uses premarin cream for lubrication- 2-3 times per week   does take ca and vit D for bones, and drinks milk   also gets out in the sun when she walks  sees derm yearly- had squamous cell lesions  has lump in L wrist- occ hurts   splinter L thumb- does not bother her xome OA in fingers     Current Allergies (reviewed today): ! EPINEPHRINE  Past Medical History:    GERD     hypothyroid (past [place])    depression    IBS    ETD    Meniere's disease    idiopathic gastroparesis    HH Migraine                 derm- [facility]    ENT---[facility]    [facility]---[facility]    sx --[facility]      Past Surgical History:    L5 discectomy    L TM repair    BTL, appy     graves - [place]    02 dexa nl    99 colonosc nl    99 EGD GERD    02 abd [place] neg    02 EGD- chroinic GERD and barretts    02  colonosc neg    05 dexa nl     EGD HH, [place] neg   Family History:    P aunt with BCA and DM    M grandparents HTN  Social History:    never smoked    drinks wine 1 glass daily     walking for exercise    Risk Factors:  PAP Smear History:     Date of Last PAP Smear:  10/20/2006    Results:  normal   Colonoscopy History:     Date of Last Colonoscopy:  02/04/2001    Results:  normal     Physical Exam  General:     Well-developed,well-nourished,in no acute distress; alert,appropriate and cooperative throughout examination Head:  normocephalic, atraumatic, and no abnormalities observed.   Eyes:     vision grossly intact, pupils equal, pupils round, and pupils reactive to light.   Ears:     R ear normal and L ear normal.   Nose:     no nasal discharge.   Mouth:     pharynx pink and moist.   Neck:     supple with full rom and no masses or thyromegally, no JVD or carotid bruit  Chest Wall:     No deformities, masses, or tenderness noted. Breasts:     No mass, nodules, thickening, tenderness, bulging, retraction, inflamation, nipple discharge or skin changes noted.   Lungs:     Normal respiratory effort, chest expands  symmetrically. Lungs are clear to auscultation, no crackles or wheezes. Heart:     Normal rate and regular rhythm. S1 and S2 normal without gallop, murmur, click, rub or other extra sounds. Abdomen:     Bowel sounds positive,abdomen soft and non-tender without masses, organomegaly or hernias noted.  no renal bruits  Msk:     No deformity or scoliosis noted of thoracic or lumbar spine.  no acute joint changes Pulses:     R and L carotid,radial,femoral,dorsalis pedis and posterior tibial pulses are full and equal bilaterally Extremities:     No clubbing, cyanosis, edema, or deformity noted with normal full range of motion of all joints.   Neurologic:     sensation intact to light touch, gait normal, and DTRs symmetrical and normal.   Skin:     Intact without suspicious lesions or rashes diffuse lentigos/solar aging Cervical Nodes:     No lymphadenopathy noted Axillary Nodes:     No palpable lymphadenopathy Inguinal Nodes:     No significant adenopathy Psych:     normal affect, talkative and pleasant     Impression & Recommendations:  Problem # 1:  HYPOTHYROIDISM (ICD-244.9) Assessment: Unchanged stable clinically- will do labs and adjust Her updated medication list for this problem includes:    Synthroid 75 Mcg Tabs (Levothyroxine sodium) .Marland Kitchen... 1 once daily  Labs Reviewed: TSH:  2.40 (11/01/2006)    Chol: 223 (11/01/2006)   HDL: 78.1 (11/01/2006)   LDL: DEL (11/01/2006)   TG: 68 (11/01/2006)   Problem # 2:  GERD (ICD-530.81) Assessment: Unchanged with gastroparesis  overall well controlled with nexium and good eating habits/ stable wt Her updated medication list for this problem includes:    Nexium 40 Mg Cpdr (Esomeprazole magnesium) .Marland Kitchen... 1 by mouth two times a day   Problem # 3:  MENIERE'S DISEASE (ICD-386.00) Assessment: Unchanged stable- takes diazide prn  Problem # 4:  SCREENING FOR LIPOID DISORDERS (ICD-V77.91) Assessment: Comment Only lipids today- very good diet and exercise  Problem # 5:  Screening Breast Cancer (ICD-V76.10) Assessment: Comment Only mam utd and exam done inst pt to continue monthly self exams  Complete Medication List: 1)  Synthroid 75 Mcg Tabs (Levothyroxine sodium) .Marland Kitchen.. 1 once daily 2)  Dyazide 37.5-25 Mg Caps (Triamterene-hctz) .Marland Kitchen.. 1 once daily prn 3)  Premarin 0.625 Mg/gm Crea (Estrogens, conjugated) .Marland Kitchen.. 1 applicator monday wednesday// friday at hs 4)  Reglan 5 Mg Tabs (Metoclopramide hcl) .Marland Kitchen.. 1 three times a day ac meals 5)  Nexium 40 Mg Cpdr (Esomeprazole magnesium) .Marland Kitchen.. 1 by mouth two times a day 6)  Butalbital-apap-caffeine 50-500-40 Mg Tabs (Butalbital-apap-caffeine) .... 2 at onset of headache 1 q4hours prn 7)  Ergotamine-caffeine 1-100 Mg Tabs (Ergotamine-caffeine) .... 2 by mouth as needed headache, then 1 by mouth one hour later prn   Patient Instructions: 1)  the current recommendation for calcium intake is 1200-1500 mg daily with [phone] IU of vitamin D  2)  please schedule fasting labs for lipid v77.91, renal panel, cbc with diff 530.81, and tsh 244.9    Prescriptions: ERGOTAMINE-CAFFEINE 1-100 MG  TABS (ERGOTAMINE-CAFFEINE) 2 by mouth as needed headache, then 1 by mouth one hour later prn  #30 x 3   Entered and Authorized by:   Judith Part MD   Signed by:   Judith Part MD on 12/05/2007    Method used:   Print then Give to Patient   RxID:   [id number] NEXIUM 40 MG  CPDR (ESOMEPRAZOLE MAGNESIUM) 1  by mouth two times a day  #180 x 3   Entered and Authorized by:   Judith Part MD   Signed by:   Judith Part MD on 12/05/2007   Method used:   Print then Give to Patient   RxID:   [id number] REGLAN 5 MG  TABS (METOCLOPRAMIDE HCL) 1 three times a day ac meals  #270 x 3   Entered and Authorized by:   Judith Part MD   Signed by:   Judith Part MD on 12/05/2007   Method used:   Print then Give to Patient   RxID:   [id number] PREMARIN 0.625 MG/GM  CREA (ESTROGENS, CONJUGATED) 1 applicator monday wednesday// friday at hs  #3 months x 3   Entered and Authorized by:   Judith Part MD   Signed by:   Judith Part MD on 12/05/2007   Method used:   Print then Give to Patient   RxID:   [id number] SYNTHROID 75 MCG  TABS (LEVOTHYROXINE SODIUM) 1 once daily  #90 x 3   Entered and Authorized by:   Judith Part MD   Signed by:   Judith Part MD on 12/05/2007   Method used:   Print then Give to Patient   RxID:   [id number]  ] Current Allergies (reviewed today): ! EPINEPHRINE Current Medications (including changes made in today's visit):  SYNTHROID 75 MCG  TABS (LEVOTHYROXINE SODIUM) 1 once daily DYAZIDE 37.5-25 MG  CAPS (TRIAMTERENE-HCTZ) 1 once daily prn PREMARIN 0.625 MG/GM  CREA (ESTROGENS, CONJUGATED) 1 applicator monday wednesday// friday at hs REGLAN 5 MG  TABS (METOCLOPRAMIDE HCL) 1 three times a day ac meals NEXIUM 40 MG  CPDR (ESOMEPRAZOLE MAGNESIUM) 1 by mouth two times a day BUTALBITAL-APAP-CAFFEINE 50-500-40 MG  TABS (BUTALBITAL-APAP-CAFFEINE) 2 at onset of headache 1 q4hours prn ERGOTAMINE-CAFFEINE 1-100 MG  TABS (ERGOTAMINE-CAFFEINE) 2 by mouth as needed headache, then 1 by mouth one hour later prn   Preventive Care Screening  Colonoscopy:    Date:  02/04/2001    Next Due:  02/2011    Results:  normal   Last Flu  Shot:    Date:  05/22/2007    Results:  given   Pap Smear:    Date:  10/20/2006    Results:  normal   Bone Density:    Date:  12/20/2003    Results:  normal std dev  Last Tetanus Booster:    Date:  12/19/2001    Results:  given

## 2010-09-20 NOTE — Assessment & Plan Note (Signed)
Summary: ? UTI   Vital Signs:  Patient Profile:   70 Years Old Female Height:     67 inches Weight:      131 pounds Temp:     98.7 degrees F oral Pulse rate:   64 / minute Pulse rhythm:   regular BP sitting:   124 / 80  (left arm) Cuff size:   regular  Vitals Entered By: Sydell Axon (October 06, 2008 3:09 PM)                 PCP:  Roxy Manns MD  Chief Complaint:  ? UTI, started Sunday morning with burning with urination, urgency and blood in urine, and had some Cipro on hand and has been taking that and feels some better.  History of Present Illness: Here for ? UTI--had hematuria on 10/04/2008--dysuria, cloudy, nocturia x 2-3--more than usual, some urgency and frequency --saw Dr Grapey 02/25/08--no blood at that time --was tx for UTI 5/09 in [place] --no problems since early morning of 2./14--she started Cipro 500 mg two times a day since 2/14--going on crouise 2/20 for 1 wk    Current Allergies (reviewed today): ! EPINEPHRINE     Review of Systems      See HPI   Physical Exam  General:     alert, well-developed, well-nourished, and well-hydrated.   Abdomen:     no CVA tenderness Psych:     normally interactive and good eye contact.      Impression & Recommendations:  Problem # 1:  UTI (ICD-599.0) Assessment: New suspect UTI by S&S will continue cipro for full 7d two times a day  discussed hygeine and increased water Her updated medication list for this problem includes:    Cipro 500 Mg Tabs (Ciprofloxacin hcl) ..... 1 two times a day  Orders: UA Dipstick W/ Micro (manual) (81000)   Complete Medication List: 1)  Synthroid 75 Mcg Tabs (Levothyroxine sodium) .... 1 once daily 2)  Reglan 5 Mg Tabs (Metoclopramide hcl) .... 1 three times a day ac meals 3)  Nexium 40 Mg Cpdr (Esomeprazole magnesium) .... 1 by mouth two times a day 4)  Butalbital-apap-caffeine 50-500-40 Mg Tabs (Butalbital-apap-caffeine) .... 2 at onset of headache 1 q4hours  prn 5)  Ergotamine-caffeine 1-100 Mg Tabs (Ergotamine-caffeine) .... 2 by mouth as needed headache, then 1 by mouth one hour later prn 6)  Calcium Carbonate Powd (Calcium carbonate) .... 1 by mouth once daily 7)  Vitamin B-12 500 Mcg Tabs (Cyanocobalamin) .... 1 by mouth once daily 8)  Carafate 1 Gm/10ml Susp (Sucralfate) .... Take 10 cc after meals 9)  Aciphex 20 Mg Tbec (Rabeprazole sodium) .... Take 1 each day 30 minutes before meals 10)  Protonix 40 Mg Tbec (Pantoprazole sodium) .... 1 each day 30 minutes before meal 11)  Kapidex 30 Mg Cpdr (Dexlansoprazole) .... Take one capsule by mouth 30 minutes before breakfast 12)  Cipro 500 Mg Tabs (Ciprofloxacin hcl) .... 1 two times a day    Prescriptions: CIPRO 500 MG TABS (CIPROFLOXACIN HCL) 1 two times a day  #8 x 0   Entered and Authorized by:   Billie-Lynn Daniels  FNP   Signed by:   Billie-Lynn Daniels  FNP on 10/06/2008   Method used:   Electronically to        [facility]* (retail)       [address]       [place], [place]  [address]       Ph: [phone]  Fax: [phone]   RxID:   [id number]   Current Allergies (reviewed today): ! EPINEPHRINE  Laboratory Results   Urine Tests  Date/Time Received: October 06, 2008 3:11 PM  Date/Time Reported: October 06, 2008 3:11 PM   Routine Urinalysis   Color: yellow Appearance: Cloudy Glucose: negative   (Normal Range: Negative) Bilirubin: negative   (Normal Range: Negative) Ketone: negative   (Normal Range: Negative) Spec. Gravity: 1.015   (Normal Range: 1.003-1.035) Blood: negative   (Normal Range: Negative) pH: 6.5   (Normal Range: 5.0-8.0) Protein: trace   (Normal Range: Negative) Urobilinogen: 0.2   (Normal Range: 0-1) Nitrite: negative   (Normal Range: Negative) Leukocyte Esterace: small   (Normal Range: Negative)  Urine Microscopic WBC/HPF: 3-5 RBC/HPF: 0-1 Bacteria/HPF: 0 Epithelial/HPF: occ        Laboratory Results   Urine  Tests    Routine Urinalysis   Color: yellow Appearance: Cloudy Glucose: negative   (Normal Range: Negative) Bilirubin: negative   (Normal Range: Negative) Ketone: negative   (Normal Range: Negative) Spec. Gravity: 1.015   (Normal Range: 1.003-1.035) Blood: negative   (Normal Range: Negative) pH: 6.5   (Normal Range: 5.0-8.0) Protein: trace   (Normal Range: Negative) Urobilinogen: 0.2   (Normal Range: 0-1) Nitrite: negative   (Normal Range: Negative) Leukocyte Esterace: small   (Normal Range: Negative)  Urine Microscopic WBC/hpf: 3-5 RBC/hpf: 0-1 Bacteria: 0 Epithelial: occ

## 2010-09-20 NOTE — Miscellaneous (Signed)
Summary: mammo results   Clinical Lists Changes  Observations: Added new observation of MAMMO DUE: 05/2008 (05/23/2007 8:09) Added new observation of MAMMOGRAM: normal bilateral (05/22/2007 8:09)       Preventive Care Screening  Mammogram:    Date:  05/22/2007    Next Due:  05/2008    Results:  normal bilateral

## 2010-09-20 NOTE — Progress Notes (Signed)
Summary: Carafate rx ?'s  Medications Added ACIPHEX 20 MG  TBEC (RABEPRAZOLE SODIUM) Take 1 each day 30 minutes before meals PROTONIX 40 MG  TBEC (PANTOPRAZOLE SODIUM) 1 each day 30 minutes before meal KAPIDEX 30 MG  CPDR (DEXLANSOPRAZOLE) Take one capsule by mouth 30 minutes before breakfast       Phone Note Call from Patient Call back at Home Phone [phone]   Caller: Patient Call For: Dr. Jarold Motto Reason for Call: Talk to Nurse Details for Reason: Carafate rx ?'s Summary of Call: pt has ?'s ragarding duration of rx for Carafate Initial call taken by: Vallarie Mare,  September 21, 2008 10:45 AM  Follow-up for Phone Call        says that taking nexium two times a day is not helping she is still having reflux at night and she is only taking the carafate once a day becuase, taking it even twice a day made her nauseas. I adivsed her i would put some samples of ppis out front for 2 weeks each and she can try them and call back when she is finished all samples to let me knwo which one works best for her so i can send an rx for that drug.  gave 2 weeks of protonix, kapidex and aciphex. Follow-up by: Harlow Mares CMA,  September 21, 2008 10:58 AM    New/Updated Medications: ACIPHEX 20 MG  TBEC (RABEPRAZOLE SODIUM) Take 1 each day 30 minutes before meals PROTONIX 40 MG  TBEC (PANTOPRAZOLE SODIUM) 1 each day 30 minutes before meal KAPIDEX 30 MG  CPDR (DEXLANSOPRAZOLE) Take one capsule by mouth 30 minutes before breakfast

## 2010-09-20 NOTE — Progress Notes (Signed)
Summary: zostavax   Phone Note Call from Patient Call back at Home Phone [phone]   Caller: Patient Call For: dr Emonii Wienke Summary of Call: pt asks if she should get zostavax, says her insurance will pay 80%. I told her we don't have it right now Initial call taken by: Lowella Petties,  July 02, 2007 9:51 AM  Follow-up for Phone Call        if she is not on any meds that would comprimise her immune system, like steroids or chemo- she can have it when it is no longer on back order- can schedule Follow-up by: Judith Part MD,  July 02, 2007 10:36 AM  Additional Follow-up for Phone Call Additional follow up Details #1::        advised pt- left message on machine Additional Follow-up by: Lowella Petties,  July 02, 2007 12:35 PM

## 2010-09-20 NOTE — Assessment & Plan Note (Signed)
Summary: BLADDER PROBLEMS / LFW   Vital Signs:  Patient Profile:   70 Years Old Female Weight:      133 pounds Temp:     98.4 degrees F oral Pulse rate:   80 / minute Pulse rhythm:   regular BP sitting:   140 / 82  (left arm) Cuff size:   regular  Vitals Entered By: Lowella Petties (January 27, 2008 8:39 AM)                 Chief Complaint:  Follow up for UTI- taking cipro 500 mg for 10 days- was told to recheck urine after 5 days to see if she needs to continue cipro.  History of Present Illness: [place]  now bladder sympt are much better- still litte pressure no fever or n/v  rash is much better   given cipro 500 two times a day - and then re check after 7 days (urgent care [place] the beach)  also had rash on abd before this-- they checked cbc and given steroids  ? what her dx was      Current Allergies: ! EPINEPHRINE  Past Medical History:    Reviewed history from 12/05/2007 and no changes required:       GERD        hypothyroid (past RI)       depression       IBS       ETD       Meniere's disease       idiopathic gastroparesis       HH Migraine                             derm- [facility]       ENT---[facility]       [facility]---[facility]       sx --[facility]         Past Surgical History:    Reviewed history from 12/05/2007 and no changes required:       L5 discectomy       L TM repair       BTL, appy        graves - RI       02 dexa nl       99 colonosc nl       99 EGD GERD       02 abd [place] neg       02 EGD- chroinic GERD and barretts       02  colonosc neg       05 dexa nl        EGD HH, [place] neg   Family History:    Reviewed history from 12/05/2007 and no changes required:       P aunt with BCA and DM       M grandparents HTN  Social History:    Reviewed history from 12/05/2007 and no changes required:       never smoked       drinks wine 1 glass daily        walking for exercise     Review of Systems  General      Denies  fever, loss of appetite, and malaise.  CV      Denies chest pain or discomfort and palpitations.  Resp      Denies cough and shortness of breath.  GI  Denies bloody stools, change in bowel habits, nausea, and vomiting.  GU      Complains of dysuria and urinary frequency.      Denies discharge, hematuria, and urinary hesitancy.  Derm      Complains of rash.      rash is almost gone now  Psych      mood is ok    Physical Exam  General:     Well-developed,well-nourished,in no acute distress; alert,appropriate and cooperative throughout examination Head:     normocephalic, atraumatic, and no abnormalities observed.   Neck:     supple with full rom and no masses or thyromegally, no JVD or carotid bruit  Lungs:     Normal respiratory effort, chest expands symmetrically. Lungs are clear to auscultation, no crackles or wheezes. Heart:     Normal rate and regular rhythm. S1 and S2 normal without gallop, murmur, click, rub or other extra sounds. Abdomen:     very slt suprapubic tenderness no rebound or gaurding soft, no distention, and no masses.   Msk:     no CVA tenderness  Extremities:     No clubbing, cyanosis, edema, or deformity noted with normal full range of motion of all joints.   Skin:     few erythematous papules on upper abd no whelps or vesicles  Cervical Nodes:     No lymphadenopathy noted Inguinal Nodes:     No significant adenopathy Psych:     normal affect, talkative and pleasant     Impression & Recommendations:  Problem # 1:  ACUTE CYSTITIS (ICD-595.0) Assessment: Improved almost completely resolved with cipro adv to take 3 more days ucx pending pt advised to update me if symptoms worsen or do not improve  Orders: T-Culture, Urine ([id number]) UA Dipstick W/ Micro (manual) ([id number])   Problem # 2:  SKIN RASH (ICD-782.1) Assessment: Improved now almost completely resolved- s/p pred from UC adv to update if it comes back- and keep  diary of exp -- food/products   Complete Medication List: 1)  Synthroid 75 Mcg Tabs (Levothyroxine sodium) .Marland Kitchen.. 1 once daily 2)  Dyazide 37.5-25 Mg Caps (Triamterene-hctz) .Marland Kitchen.. 1 once daily prn 3)  Premarin 0.625 Mg/gm Crea (Estrogens, conjugated) .Marland Kitchen.. 1 applicator monday wednesday// friday at hs 4)  Reglan 5 Mg Tabs (Metoclopramide hcl) .Marland Kitchen.. 1 three times a day ac meals 5)  Nexium 40 Mg Cpdr (Esomeprazole magnesium) .Marland Kitchen.. 1 by mouth two times a day 6)  Butalbital-apap-caffeine 50-500-40 Mg Tabs (Butalbital-apap-caffeine) .... 2 at onset of headache 1 q4hours prn 7)  Ergotamine-caffeine 1-100 Mg Tabs (Ergotamine-caffeine) .... 2 by mouth as needed headache, then 1 by mouth one hour later prn   Patient Instructions: 1)  continue drinking lots of water 2)  call or seek care is symptoms don't improve in 2-3 days or if you develop back pain, nausea, or vomiting 3)  take the cipro as directed for 3 more days 4)  will update you when culture returns   ] Laboratory Results   Urine Tests  Date/Time Received: January 27, 2008 8:43 AM  Date/Time Reported: January 27, 2008 8:43 AM   Routine Urinalysis   Color: yellow Appearance: Clear Glucose: negative   (Normal Range: Negative) Bilirubin: negative   (Normal Range: Negative) Ketone: negative   (Normal Range: Negative) Spec. Gravity: 1.015   (Normal Range: 1.003-1.035) Blood: trace-intact   (Normal Range: Negative) pH: 5.0   (Normal Range: 5.0-8.0) Protein: trace   (Normal Range: Negative) Urobilinogen: 0.2   (  Normal Range: 0-1) Nitrite: negative   (Normal Range: Negative) Leukocyte Esterace: negative   (Normal Range: Negative)  Urine Microscopic WBC/HPF: 2-3 RBC/HPF: 0 Bacteria/HPF: few Mucous/HPF: 0 Epithelial/HPF: 1-2 Crystals/HPF: 0 Casts/LPF: 0 Yeast/HPF: 0        Laboratory Results   Urine Tests    Routine Urinalysis   Color: yellow Appearance: Clear Glucose: negative   (Normal Range: Negative) Bilirubin:  negative   (Normal Range: Negative) Ketone: negative   (Normal Range: Negative) Spec. Gravity: 1.015   (Normal Range: 1.003-1.035) Blood: trace-intact   (Normal Range: Negative) pH: 5.0   (Normal Range: 5.0-8.0) Protein: trace   (Normal Range: Negative) Urobilinogen: 0.2   (Normal Range: 0-1) Nitrite: negative   (Normal Range: Negative) Leukocyte Esterace: negative   (Normal Range: Negative)  Urine Microscopic WBC/hpf: 2-3 RBC/hpf: 0 Bacteria: few Mucous: 0 Epithelial: 1-2 Crystals/LPF: 0 Casts/LPF: 0 Yeast/HPF: 0

## 2010-09-20 NOTE — Assessment & Plan Note (Signed)
Summary: BACK PAIN/RBH   Vital Signs:  Patient profile:   70 year old female Weight:      133.04 pounds Temp:     79.9 degrees F oral Pulse rate:   78 / minute Pulse rhythm:   regular BP sitting:   120 / 80  (left arm) Cuff size:   regular  Vitals Entered By: Kern Reap CMA (AAMA) (July 02, 2009 10:12 AM)  Reason for Visit lower back and abdominal pain Is Patient Diabetic? No Pain Assessment Patient in pain? yes     Location: lower back Intensity: 2 Type: burning Onset of pain  Chronic   History of Present Illness: back pain intermittent for 3 years goes to chiropractor and then gets better  this time- is not improved   hurts in R buttock and pain runs down her leg  pain is burning/hot and dull  is worst to "slouch " in a chair  also feels pull when she bends foward better in bed on back with knees up in general - is able to sleep  walking really bothers it -- irritates it when she is done  no numbness / and no weakness in either leg or foot   had one disc removed L5 over 20 y ago  no particular trauma   is using ice gel pack - and this helps some  getting adjustments  he suggested trying ibup for 10 days -- and that did help some   is some better but cannot do regular walking or sit for a long time  x rays 3 y ago - had "slight bulging disk" never told she has arthritis   is still having GI problems -- had tests and is on probiotics and antibiotics from Dr Jarold Motto has still calmed down some  still feels like a lot of pressure in pelvic area  severe stomach cramps twice followed by loose stool  no specific diet association  even had celiac dz tests which were neg  will be trying align one week per months   no urinary symptoms    Allergies: 1)  ! Epinephrine  Past History:  Past Medical History: Last updated: 02/01/2009 GERD  hypothyroid (past RI) depression IBS ETD Meniere's disease idiopathic gastroparesis HH Migraine    osteopenia    derm- [facility]---[facility]---[facility] sx --[facility]  Family History: Last updated: 05/27/2009 P aunt with BCA and DM M grandparents HTN No FH of Colon Cancer:  Social History: Last updated: 05/27/2009 never smoked drinks wine 1 glass daily  walking for exercise  Occupation: Retired  Daily Caffeine Use: One cup of coffee in the morning, and once a week 4oz of coke Illicit Drug Use - no  Past Surgical History: L5 discectomy L TM repair BTL, appy  graves - RI 02 dexa nl 99 colonosc nl 99 EGD GERD 02 abd [place] neg 02 EGD- chroinic GERD and barretts 02  colonosc neg 05 dexa nl  EGD HH, [place] neg 11/09 EGD barretts, HH, GERD  11/09 gastric emptying scan nl dexa 6/10 osteopenia   Review of Systems General:  Denies chills, fatigue, fever, loss of appetite, and malaise. Eyes:  Denies blurring. CV:  Denies chest pain or discomfort, lightheadness, palpitations, and shortness of breath with exertion. Resp:  Denies cough and wheezing. GI:  Complains of abdominal pain and diarrhea; denies bloody stools, change in bowel habits, dark tarry stools, and indigestion. GU:  Denies abnormal vaginal bleeding, discharge, dysuria, urinary frequency, and urinary hesitancy. MS:  Complains  of low back pain and stiffness; denies joint redness, joint swelling, cramps, and muscle weakness. Derm:  Denies lesion(s), poor wound healing, and rash. Neuro:  Denies numbness and tingling. Psych:  Denies anxiety and depression. Endo:  Denies cold intolerance and heat intolerance. Heme:  Denies abnormal bruising and bleeding.  Physical Exam  General:  Well-developed,well-nourished,in no acute distress; alert,appropriate and cooperative throughout examination Head:  normocephalic, atraumatic, and no abnormalities observed.   Eyes:  vision grossly intact, pupils equal, pupils round, and pupils reactive to light.  no conjunctival pallor, injection or icterus  Neck:  supple with full rom and  no masses or thyromegally, no JVD or carotid bruit  Lungs:  Normal respiratory effort, chest expands symmetrically. Lungs are clear to auscultation, no crackles or wheezes. Heart:  Normal rate and regular rhythm. S1 and S2 normal without gallop, murmur, click, rub or other extra sounds. Abdomen:  mild suprapubic tenderness to deep palp without rebound or gaurding  soft, normal bowel sounds, no distention, no masses, no hepatomegaly, and no splenomegaly.   Msk:  LS no tenderness no cva tenderness  some very mild R muscular tenderness- gluteal  LS flex 50 deg- pain, also on L lat flex neg slr nl rom hips   Pulses:  R and L carotid,radial,femoral,dorsalis pedis and posterior tibial pulses are full and equal bilaterally Extremities:  No clubbing, cyanosis, edema, or deformity noted with normal full range of motion of all joints.   Neurologic:  sensation intact to light touch, gait normal, and DTRs symmetrical and normal.   Skin:  Intact without suspicious lesions or rashes Cervical Nodes:  No lymphadenopathy noted Inguinal Nodes:  No significant adenopathy Psych:  normal affect, talkative and pleasant    Impression & Recommendations:  Problem # 1:  BACK PAIN, LUMBAR (ICD-724.2) Assessment New ongoing and worse lately with rad to R leg which is worse despite chiropractic care  will ref for LS x rays  consider PT  choose low impact exercise for now Her updated medication list for this problem includes:    Butalbital-apap-caffeine 50-500-40 Mg Tabs (Butalbital-apap-caffeine) .Marland Kitchen... 2 at onset of headache 1 q4hours prn  Orders: Radiology Referral (Radiology) Radiology Referral (Radiology)  Problem # 2:  PELVIC  PAIN (ICD-789.09) Assessment: New pt unsure if due to GI problems or not -- is not getting better in light of this and back pain-- will check pelvic US and then adv  will continue gi f/u and med plan for now Her updated medication list for this problem includes:     Butalbital-apap-caffeine 50-500-40 Mg Tabs (Butalbital-apap-caffeine) .Marland Kitchen... 2 at onset of headache 1 q4hours prn  Orders: Radiology Referral (Radiology)  Complete Medication List: 1)  Synthroid 75 Mcg Tabs (Levothyroxine sodium) .Marland Kitchen.. 1 by mouth once daily 2)  Nexium 40 Mg Cpdr (Esomeprazole magnesium) .Marland Kitchen.. 1 by mouth two times a day 3)  Butalbital-apap-caffeine 50-500-40 Mg Tabs (Butalbital-apap-caffeine) .... 2 at onset of headache 1 q4hours prn 4)  Ergotamine-caffeine 1-100 Mg Tabs (Ergotamine-caffeine) .... 2 by mouth as needed headache, then 1 by mouth one hour later prn 5)  Calcium Carbonate Powd (Calcium carbonate) .Marland Kitchen.. 1 by mouth once daily 6)  Vitamin B-12 500 Mcg Tabs (Cyanocobalamin) .Marland Kitchen.. 1 by mouth once daily 7)  Triamcinolone Acetonide 0.025 % Crea (Triamcinolone acetonide) .... Apply to affected area every other day to once daily as needed 8)  Carafate 1 Gm/36ml Susp (Sucralfate) .... One tablespoon after meals as needed 9)  Align Caps (Misc intestinal flora regulat) .Marland KitchenMarland KitchenMarland Kitchen  Take one capsule by mouth daily  Patient Instructions: 1)  continue ice of your back and keep stretched out  2)  we will refer you for x rays at check out  3)  I will refer you for pelvic US at check out  4)  I will update you with results and further plan   Immunization History:  Influenza Immunization History:    Influenza:  historical (05/21/2009)

## 2010-09-20 NOTE — Progress Notes (Signed)
Summary: synthroid  Phone Note Refill Request Call back at Home Phone [phone] Message from:  Patient on March 28, 2010 3:09 PM  Refills Requested: Medication #1:  SYNTHROID 43 MCG TABS take one tablet by mouth once daily Patient is asking if she can get a 3 month  90 day supply sent to [facility].   Initial call taken by: Melody Comas,  March 28, 2010 3:10 PM    Prescriptions: SYNTHROID 88 MCG TABS (LEVOTHYROXINE SODIUM) take one tablet by mouth once daily  #90 x 2   Entered by:   Benny Lennert CMA (AAMA)   Authorized by:   Judith Part MD   Signed by:   Benny Lennert CMA (AAMA) on 03/28/2010   Method used:   Electronically to        [facility]* (retail)             ,          Ph: [phone]       Fax: [phone]   RxID:   [id number]

## 2010-09-20 NOTE — Letter (Signed)
Summary: Results Follow up Letter  [place] [facility]  [address] [place], [place] [address]   Phone: [phone]  Fax: [phone]    09/27/2009 MRN: [id number]       [facility] [address] [place], [place]  [address]      Dear Ms. Darliss Cheney,  The following are the results of your recent test(s):  Test         Result    Pap Smear:        Normal _____  Not Normal _____ Comments: ______________________________________________________ Cholesterol: LDL(Bad cholesterol):         Your goal is less than:         HDL (Good cholesterol):       Your goal is more than: Comments:  ______________________________________________________ Mammogram:        Normal __X___  Not Normal _____ Comments: follow up in one year  ___________________________________________________________________ Hemoccult:        Normal _____  Not normal _______ Comments:    We routinely do not discuss normal results over the telephone.  If you desire a copy of the results, or you have any questions about this information we can discuss them at your next office visit.   Sincerely,      Colon Flattery [place],MD

## 2010-09-20 NOTE — Assessment & Plan Note (Signed)
Summary: cpx   Vital Signs:  Patient profile:   70 year old female Height:      65.25 inches Weight:      136.25 pounds BMI:     22.58 Temp:     98.1 degrees F oral Pulse rate:   76 / minute Pulse rhythm:   regular BP sitting:   120 / 82  (left arm) Cuff size:   regular  Vitals Entered By: Lewanda Rife LPN (December 17, 2009 10:20 AM) CC: chronic med problems    History of Present Illness: here for review of chronic med probs and to [facility] mt list   has been feeling ok overall  is feeling her age   is having a lot of back trouble -- after 2 mo of PT did imp and did exercises at home  now a little worse again - and this is aff her lifestyle  SI joint is arthritic and poss disc problem  is interested in ortho eval at this time / does not want surgery   started taking glucosamine for knees - without chondrioton- just started- no eff yet   wt is up 3 lb-- is frustrated with that  getting some exercise -- 1/2 as much as she was - wants to get back to all of it   bp 120/82 - good   hypothyroid-- no changes   osteopenia mild 6/10 ca and D- is taking that regularly and good diet   due for 10 y colonosc in 2012  pap 08- wants to go ahead with that today  no gyn problems   mam 2/11 no lumps on self exam   never had pneumovax -- not interested  had shingles -- and does not want a vaccine   Allergies: 1)  ! Epinephrine  Past History:  Past Surgical History: Last updated: 07/05/2009 L5 discectomy L TM repair BTL, appy  graves - RI 02 dexa nl 99 colonosc nl 99 EGD GERD 02 abd [place] neg 02 EGD- chroinic GERD and barretts 02  colonosc neg 05 dexa nl  EGD HH, [place] neg 11/09 EGD barretts, HH,  11/10 pelvic ultrasound- no acute findings   11/09 gastric emptying scan nl dexa 6/10 osteopenia   Family History: Last updated: 05/27/2009 P aunt with BCA and DM M grandparents HTN No FH of Colon Cancer:  Social History: Last updated: 05/27/2009 never  smoked drinks wine 1 glass daily  walking for exercise  Occupation: Retired  Daily Caffeine Use: One cup of coffee in the morning, and once a week 4oz of coke Illicit Drug Use - no  Past Medical History: GERD  hypothyroid (past RI) depression IBS ETD Meniere's disease idiopathic gastroparesis HH Migraine  osteopenia  chronic back pain DDD, SI joint arthritis   derm- [facility]---[facility]---[facility] sx --[facility]  Review of Systems General:  Denies fatigue, malaise, sweats, and weakness. Eyes:  Denies blurring and eye pain. CV:  Denies chest pain or discomfort, palpitations, and shortness of breath with exertion. Resp:  Denies cough and wheezing. GI:  Denies abdominal pain, change in bowel habits, indigestion, nausea, and vomiting. GU:  Denies abnormal vaginal bleeding, discharge, dysuria, and urinary frequency. MS:  Complains of low back pain and mid back pain; denies joint pain, joint redness, joint swelling, muscle aches, and muscle weakness. Derm:  Denies lesion(s), poor wound healing, and rash. Neuro:  Denies numbness and tingling. Psych:  Denies anxiety and depression. Endo:  Denies cold intolerance, excessive thirst, excessive urination, and heat  intolerance. Heme:  Denies abnormal bruising and bleeding.  Physical Exam  General:  slim and well appearing  Head:  normocephalic, atraumatic, and no abnormalities observed.   Eyes:  vision grossly intact, pupils equal, pupils round, and pupils reactive to light.  no conjunctival pallor, injection or icterus  Ears:  R ear normal and L ear normal.   Nose:  no nasal discharge.   Mouth:  pharynx pink and moist.   Neck:  supple with full rom and no masses or thyromegally, no JVD or carotid bruit  Chest Wall:  No deformities, masses, or tenderness noted. Lungs:  Normal respiratory effort, chest expands symmetrically. Lungs are clear to auscultation, no crackles or wheezes. Heart:  Normal rate and regular rhythm. S1 and S2  normal without gallop, murmur, click, rub or other extra sounds. Abdomen:  Bowel sounds positive,abdomen soft and non-tender without masses, organomegaly or hernias noted. no renal bruits  Genitalia:  Normal introitus for age, no external lesions, no vaginal discharge, mucosa pink and moist, no vaginal or cervical lesions, no vaginal atrophy, no friaility or hemorrhage, normal uterus size and position, no adnexal masses or tenderness Msk:  No deformity or scoliosis noted of thoracic or lumbar spine.  no acute joint changes  Pulses:  R and L carotid,radial,femoral,dorsalis pedis and posterior tibial pulses are full and equal bilaterally Extremities:  No clubbing, cyanosis, edema, or deformity noted with normal full range of motion of all joints.   Neurologic:  sensation intact to light touch, gait normal, and DTRs symmetrical and normal.   Skin:  Intact without suspicious lesions or rashes Cervical Nodes:  No lymphadenopathy noted Inguinal Nodes:  No significant adenopathy Psych:  normal affect, talkative and pleasant    Impression & Recommendations:  Problem # 1:  BACK PAIN, LUMBAR (ICD-724.2) Assessment Deteriorated ongoing with brief imp from PT  ref to ortho  Her updated medication list for this problem includes:    Butalbital-apap-caffeine 50-500-40 Mg Tabs (Butalbital-apap-caffeine) .Marland Kitchen... 2 at onset of headache 1 q4hours prn  Orders: Orthopedic Referral (Ortho)  Problem # 2:  GERD (ICD-530.81) Assessment: Unchanged good control with two times a day nexium  refil for mail order  disc diet  Her updated medication list for this problem includes:    Nexium 40 Mg Cpdr (Esomeprazole magnesium) .Marland Kitchen... 1 by mouth two times a day    Carafate 1 Gm/99ml Susp (Sucralfate) ..... One tablespoon after meals as needed  Orders: Venipuncture (29518) TLB-Lipid Panel (80061-LIPID) TLB-Renal Function Panel (80069-RENAL) TLB-TSH (Thyroid Stimulating Hormone) (84443-TSH) T-Vitamin D  (25-Hydroxy) (84166-06301) TLB-ALT (SGPT) (84460-ALT) TLB-AST (SGOT) (84450-SGOT) TLB-CBC Platelet - w/Differential (85025-CBCD) Specimen Handling (60109)  Problem # 3:  HYPOTHYROIDISM (ICD-244.9) Assessment: Unchanged stable - with no clinical changes lab today and adv  Her updated medication list for this problem includes:    Synthroid 75 Mcg Tabs (Levothyroxine sodium) .Marland Kitchen... 1 by mouth once daily  Orders: Venipuncture (32355) TLB-Lipid Panel (80061-LIPID) TLB-Renal Function Panel (80069-RENAL) TLB-TSH (Thyroid Stimulating Hormone) (84443-TSH) T-Vitamin D (25-Hydroxy) (73220-25427) TLB-ALT (SGPT) (84460-ALT) TLB-AST (SGOT) (84450-SGOT) TLB-CBC Platelet - w/Differential (85025-CBCD) Specimen Handling (06237)  Problem # 4:  Preventive Health Care (ICD-V70.0) Assessment: Comment Only pt declines pneumovax and zostavax  colonosc due 2012  Problem # 5:  OSTEOPENIA (ICD-733.90) checked in 6/10 and mild  will continue ca and vit D and check D level  Her updated medication list for this problem includes:    Calcium Carbonate Powd (Calcium carbonate) .Marland Kitchen... 1 by mouth once daily  Orders:  Specimen Handling (54098)  Problem # 6:  ROUTINE GYNECOLOGICAL EXAMINATION (ICD-V72.31) Assessment: Comment Only with pap/ exam done/ no problems   Complete Medication List: 1)  Synthroid 75 Mcg Tabs (Levothyroxine sodium) .Marland Kitchen.. 1 by mouth once daily 2)  Nexium 40 Mg Cpdr (Esomeprazole magnesium) .Marland Kitchen.. 1 by mouth two times a day 3)  Butalbital-apap-caffeine 50-500-40 Mg Tabs (Butalbital-apap-caffeine) .... 2 at onset of headache 1 q4hours prn 4)  Ergotamine-caffeine 1-100 Mg Tabs (Ergotamine-caffeine) .... 2 by mouth as needed headache, then 1 by mouth one hour later prn 5)  Calcium Carbonate Powd (Calcium carbonate) .Marland Kitchen.. 1 by mouth once daily 6)  Vitamin B-12 500 Mcg Tabs (Cyanocobalamin) .Marland Kitchen.. 1 by mouth once daily 7)  Triamcinolone Acetonide 0.025 % Crea (Triamcinolone acetonide) .... Apply  to affected area every other day to once daily as needed 8)  Carafate 1 Gm/26ml Susp (Sucralfate) .... One tablespoon after meals as needed 9)  Align Caps (Misc intestinal flora regulat) .... Take one capsule by mouth daily 10)  Glucosamine 500 Mg Caps (Glucosamine sulfate) .... Take one capsule three times a day 11)  Citrucel Powd (Methylcellulose (laxative)) .... As directed  Patient Instructions: 1)  try glucosamine with chondroiton for your joints- that may help a bit 2)  we will do orthopedic referral for back pain at check out  3)  labs today (consider Dr Ethelene Hal if availible and covered)  4)  ask Rob at [place] to call and tell me closest subsitution for your migraine med is  5)  labs today  Prescriptions: NEXIUM 40 MG  CPDR (ESOMEPRAZOLE MAGNESIUM) 1 by mouth two times a day  #180 x 3   Entered and Authorized by:   Judith Part MD   Signed by:   Judith Part MD on 12/17/2009   Method used:   Print then Give to Patient   RxID:   [id number] SYNTHROID 75 MCG  TABS (LEVOTHYROXINE SODIUM) 1 by mouth once daily  #90 x 3   Entered and Authorized by:   Judith Part MD   Signed by:   Judith Part MD on 12/17/2009   Method used:   Print then Give to Patient   RxID:   [id number]   Current Allergies (reviewed today): ! EPINEPHRINE   Preventive Care Screening  Contraindications of Treatment or Deferment of Test/Procedure:    Test/Procedure: Pneumovax vaccine    Reason for deferment: patient declined

## 2010-09-20 NOTE — Progress Notes (Signed)
Summary: wants to change to fioricet  Phone Note Refill Request Call back at Home Phone [phone] Message from:  Patient  Refills Requested: Medication #1:  ERGOTAMINE-CAFFEINE 1-100 MG  TABS 2 by mouth as needed headache This medicine has been taken  off the market and pt is requesting fioricet to use in it's place,  to [facility] to [facility].  She says she has discussed having to change meds with you before.  Initial call taken by: Lowella Petties CMA,  April 22, 2010 11:10 AM  Follow-up for Phone Call        Patient called back and states that she wants this in generic form.    ok- be careful due to sedation with it  px written on EMR for call in  Follow-up by: Melody Comas,  April 22, 2010 1:29 PM  Additional Follow-up for Phone Call Additional follow up Details #1::        Medicine called to pharmacy, pt advised. Additional Follow-up by: Lowella Petties CMA,  April 22, 2010 4:38 PM    New/Updated Medications: FIORICET 50-325-40 MG TABS (BUTALBITAL-APAP-CAFFEINE) 1-2 by mouth up to every 4-6 hours as needed headache Prescriptions: FIORICET 50-325-40 MG TABS (BUTALBITAL-APAP-CAFFEINE) 1-2 by mouth up to every 4-6 hours as needed headache  #30 x 0   Entered and Authorized by:   Judith Part MD   Signed by:   Lowella Petties CMA on 04/22/2010   Method used:   Telephoned to ...       [facility]* (retail)             ,          Ph: [phone]       Fax: [phone]   RxID:   [id number]

## 2010-09-20 NOTE — Progress Notes (Signed)
Summary: ? stool cards  Phone Note Call from Patient   Summary of Call: pt seen yesterdsay wanted to know if you want her to do stool cards? Initial call taken by: Liane Comber,  December 11, 2008 1:36 PM  Follow-up for Phone Call        yes- please send her stool cards, thanks Follow-up by: Judith Part MD,  December 11, 2008 2:09 PM  Additional Follow-up for Phone Call Additional follow up Details #1::        Advised patient.  ......................................................Marland KitchenNatasha Chavers December 11, 2008 2:14 PM

## 2010-09-20 NOTE — Progress Notes (Signed)
Summary: Refill   Phone Note From Pharmacy   Summary of Call: [facility] pharmacy calling.  REfill requested on Carafate susp Initial call taken by: Ashok Cordia RN,  Jan 12, 2010 4:43 PM  Follow-up for Phone Call        Rx sent. Follow-up by: Ashok Cordia RN,  Jan 12, 2010 4:44 PM    Prescriptions: CARAFATE 1 GM/10ML SUSP (SUCRALFATE) one tablespoon after meals as needed  #14 oz x 3   Entered by:   Ashok Cordia RN   Authorized by:   Mardella Layman MD [facility]   Signed by:   Ashok Cordia RN on 01/12/2010   Method used:   Electronically to        [facility]* (retail)       [address]       [place], [place]  [address]       Ph: [phone]       Fax: [phone]   RxID:   [id number]

## 2010-09-20 NOTE — Consult Note (Signed)
Summary: [facility]   Imported By: Lanelle Bal 01/11/2010 10:47:19  _____________________________________________________________________  External Attachment:    Type:   Image     Comment:   External Document

## 2010-09-20 NOTE — Miscellaneous (Signed)
Summary: update   Clinical Lists Changes  Observations: Added new observation of MAMMOGRAM: normal (09/25/2009 10:39)      Preventive Care Screening  Mammogram:    Date:  09/25/2009    Results:  normal

## 2010-09-20 NOTE — Progress Notes (Signed)
Summary: ? directions    Phone Note Call from Patient Call back at Home Phone [phone]   Caller: Patient Call For: Verlie Liotta Reason for Call: Talk to Nurse Details for Reason: ? directions Summary of Call: directions verbally given to pt yesterday on meds prescb are different from pharmacies directions needs clarification.Marland KitchenMarland KitchenOK to l/m on directions  Initial call taken by: Guadlupe Spanish [facility],  July 01, 2008 4:26 PM  Follow-up for Phone Call        advised the patient that she should take the meds after meals.  Follow-up by: Harlow Mares CMA,  July 01, 2008 4:43 PM

## 2010-10-01 ENCOUNTER — Telehealth: Payer: Self-pay | Admitting: Family Medicine

## 2010-10-01 DIAGNOSIS — K2270 Barrett's esophagus without dysplasia: Secondary | ICD-10-CM | POA: Insufficient documentation

## 2010-10-06 NOTE — Progress Notes (Signed)
  Phone Note Call from Patient   Summary of Call: pt is interested in ref to Dr Troy Sine for GI - for f/u of Barretts and to disc cancer screen  Follow-up for Phone Call        I will do ref  Follow-up by: Judith Part MD,  October 01, 2010 6:35 PM  New Problems: BARRETTS ESOPHAGUS (ICD-530.85)   New Problems: BARRETTS ESOPHAGUS (ICD-530.85)

## 2010-10-12 ENCOUNTER — Other Ambulatory Visit: Payer: Self-pay | Admitting: Family Medicine

## 2010-10-12 DIAGNOSIS — Z1231 Encounter for screening mammogram for malignant neoplasm of breast: Secondary | ICD-10-CM

## 2010-10-19 ENCOUNTER — Encounter: Payer: Self-pay | Admitting: Family Medicine

## 2010-10-19 ENCOUNTER — Ambulatory Visit (INDEPENDENT_AMBULATORY_CARE_PROVIDER_SITE_OTHER): Payer: Medicare Other | Admitting: Family Medicine

## 2010-10-19 DIAGNOSIS — J019 Acute sinusitis, unspecified: Secondary | ICD-10-CM | POA: Insufficient documentation

## 2010-10-25 ENCOUNTER — Encounter: Payer: Self-pay | Admitting: Family Medicine

## 2010-10-25 LAB — HM MAMMOGRAPHY

## 2010-10-25 LAB — HM PAP SMEAR

## 2010-10-25 LAB — HM COLONOSCOPY

## 2010-10-25 LAB — FECAL OCCULT BLOOD, GUAIAC: Fecal Occult Blood: NEGATIVE

## 2010-10-27 NOTE — Assessment & Plan Note (Signed)
Summary: CONGESTION,EAR/CLE   Vital Signs:  Patient profile:   70 year old female Height:      65.25 inches Weight:      138.25 pounds BMI:     22.91 Temp:     98.1 degrees F oral Pulse rate:   64 / minute Pulse rhythm:   slightly irregular BP sitting:   124 / 86  (left arm) Cuff size:   regular  Vitals Entered By: Lewanda Rife LPN (October 19, 2010 11:33 AM) CC: head congested, dry cough and Lt earache on and off   History of Present Illness: started getting sick for 10 days  has been taking nyquil and dayquil and mucinex  they have helped  yesterday L ear really hurt and stopped up   lots of nasal congestion not too much cough roof of mouth is sore - and face under eyes    Allergies: 1)  ! Epinephrine  Past History:  Past Medical History: Last updated: 12/17/2009 GERD  hypothyroid (past RI) depression IBS ETD Meniere's disease idiopathic gastroparesis HH Migraine  osteopenia  chronic back pain DDD, SI joint arthritis   derm- [facility]---[facility]---[facility] sx --[facility]  Past Surgical History: Last updated: 07/05/2009 L5 discectomy L TM repair BTL, appy  graves - RI 02 dexa nl 99 colonosc nl 99 EGD GERD 02 abd [place] neg 02 EGD- chroinic GERD and barretts 02  colonosc neg 05 dexa nl  EGD HH, [place] neg 11/09 EGD barretts, HH,  11/10 pelvic ultrasound- no acute findings   11/09 gastric emptying scan nl dexa 6/10 osteopenia   Family History: Last updated: 05/27/2009 P aunt with BCA and DM M grandparents HTN No FH of Colon Cancer:  Social History: Last updated: 05/27/2009 never smoked drinks wine 1 glass daily  walking for exercise  Occupation: Retired  Daily Caffeine Use: One cup of coffee in the morning, and once a week 4oz of coke Illicit Drug Use - no  Review of Systems General:  Denies chills, fatigue, and fever. Eyes:  Denies blurring, discharge, and eye irritation. ENT:  Complains of nasal congestion, postnasal drainage, sinus  pressure, and sore throat; denies ear discharge. CV:  Denies chest pain or discomfort, palpitations, and shortness of breath with exertion. Resp:  Complains of cough; denies pleuritic, shortness of breath, sputum productive, and wheezing. GI:  Denies abdominal pain, bloody stools, and change in bowel habits. Derm:  Denies rash.  Physical Exam  General:  Well-developed,well-nourished,in no acute distress; alert,appropriate and cooperative throughout examination Head:  normocephalic, atraumatic, and no abnormalities observed.  bilat maxillary sinus tenderness   Eyes:  vision grossly intact, pupils equal, pupils round, and pupils reactive to light.   Ears:  L TM very dull  R TM slt dull with some cerumen Nose:  nares are injected and congested bilaterally  Mouth:  pharynx pink and moist, no erythema, and no exudates.   Neck:  supple with full rom and no masses or thyromegally, no JVD or carotid bruit  Chest Wall:  No deformities, masses, or tenderness noted. Lungs:  Normal respiratory effort, chest expands symmetrically. Lungs are clear to auscultation, no crackles or wheezes. Heart:  Normal rate and regular rhythm. S1 and S2 normal without gallop, murmur, click, rub or other extra sounds. Skin:  Intact without suspicious lesions or rashes Cervical Nodes:  No lymphadenopathy noted Psych:  normal affect, talkative and pleasant    Impression & Recommendations:  Problem # 1:  SINUSITIS, ACUTE (ICD-461.9)  after 10 days of  uri with maxillary and ear pain  tx with augmentin recommend sympt care- see pt instructions   pt advised to update me if symptoms worsen or do not improve   Her updated medication list for this problem includes:    Nyquil 60-7.01-17-999 Mg/32ml Liqd (Pseudoeph-doxylamine-dm-apap) ..... Otc as directed.    Dayquil Multi-symptom 30-325-10 Mg/45ml Liqd (Pseudoephedrine-apap-dm) ..... Otc as directed.    Mucinex 600 Mg Xr12h-tab (Guaifenesin) ..... Otc as directed.     Augmentin 875-125 Mg Tabs (Amoxicillin-pot clavulanate) .Marland Kitchen... 1 by mouth two times a day for 10 days  Orders: Prescription Created Electronically [id number]  Complete Medication List: 1)  Nexium 40 Mg Cpdr (Esomeprazole magnesium) .Marland Kitchen.. 1 by mouth two times a day 2)  Calcium Carbonate Powd (Calcium carbonate) .Marland Kitchen.. 1 by mouth once daily 3)  Vitamin B-12 500 Mcg Tabs (Cyanocobalamin) .Marland Kitchen.. 1 by mouth once daily 4)  Triamcinolone Acetonide 0.025 % Crea (Triamcinolone acetonide) .... Apply to affected area every other day to once daily as needed 5)  Carafate 1 Gm/73ml Susp (Sucralfate) .... One tablespoon after meals as needed 6)  Align Caps (Misc intestinal flora regulat) .... Take one capsule by mouth daily for one week out of the month 7)  Citrucel Powd (Methylcellulose (laxative)) .... As directed as needed 8)  Synthroid 88 Mcg Tabs (Levothyroxine sodium) .... Take one tablet by mouth once daily 9)  Vitamin D 1000 Unit Tabs (Cholecalciferol) .... Take 1 tablet by mouth once a day 10)  Fioricet 50-325-40 Mg Tabs (Butalbital-apap-caffeine) .Marland Kitchen.. 1-2 by mouth up to every 4-6 hours as needed headache 11)  Nyquil 60-7.01-17-999 Mg/22ml Liqd (Pseudoeph-doxylamine-dm-apap) .... Otc as directed. 12)  Dayquil Multi-symptom 30-325-10 Mg/68ml Liqd (Pseudoephedrine-apap-dm) .... Otc as directed. 13)  Mucinex 600 Mg Xr12h-tab (Guaifenesin) .... Otc as directed. 14)  Augmentin 875-125 Mg Tabs (Amoxicillin-pot clavulanate) .Marland Kitchen.. 1 by mouth two times a day for 10 days  Patient Instructions: 1)  drink lots of fluids 2)  think about saline nasal spray for congestion  3)  mucinex ok  4)  nyquil is ok  5)  take the augmentin as directed  6)  if not improved in 7 days please update me Prescriptions: FIORICET 50-325-40 MG TABS (BUTALBITAL-APAP-CAFFEINE) 1-2 by mouth up to every 4-6 hours as needed headache  #30 x 0   Entered and Authorized by:   Judith Part MD   Signed by:   Judith Part MD on  10/19/2010   Method used:   Print then Give to Patient   RxID:   [id number] AUGMENTIN 875-125 MG TABS (AMOXICILLIN-POT CLAVULANATE) 1 by mouth two times a day for 10 days  #20 x 0   Entered and Authorized by:   Judith Part MD   Signed by:   Judith Part MD on 10/19/2010   Method used:   [facility] [address] [facility]ail[facility]       [address]       [place], [place]  [address]       Ph: [phone] or [phone]       Fax: [phone]   RxID:   [id number]    Orders Added: 1)  Prescription Created Electronically [G8553] 2)  Est. Patient Level III [60630]    Current Allergies (reviewed today): ! EPINEPHRINE

## 2010-11-02 ENCOUNTER — Telehealth: Payer: Self-pay | Admitting: Family Medicine

## 2010-11-08 NOTE — Progress Notes (Signed)
Summary: ear is stopped up / scratchy throat.   Phone Note Call from Patient Call back at Home Phone [phone]   Caller: Patient Call For: Judith Part MD Summary of Call: Patient was seen on 2-29, she says that she still is not feeling completely better. She says that her congestion has improved, but she still has very scratchy throat, and ear is stopped  up. [facility]. Leave message w/ instructions if she doesn't answer.  Initial call taken by: Melody Comas,  November 02, 2010 8:58 AM  Follow-up for Phone Call        lets give another 5 days of augmentin and then update me  px written on EMR for call in  if worse at any time , please call Follow-up by: Judith Part MD,  November 02, 2010 10:53 AM  Additional Follow-up for Phone Call Additional follow up Details #1::        Patient notified as instructed by telephone. Medication phoned to  [facility] pharmacy as instructed. Lewanda Rife LPN  November 02, 2010 11:01 AM     New/Updated Medications: AUGMENTIN 875-125 MG TABS (AMOXICILLIN-POT CLAVULANATE) 1 by mouth two times a day for 5 days Prescriptions: AUGMENTIN 875-125 MG TABS (AMOXICILLIN-POT CLAVULANATE) 1 by mouth two times a day for 5 days  #10 x 0   Entered and Authorized by:   Judith Part MD   Signed by:   Lewanda Rife LPN on 56/21/3086   Method used:   Telephoned to ...       [facility]* (retail)             ,          Ph: [phone]       Fax: [phone]   RxID:   [id number]

## 2010-11-24 ENCOUNTER — Ambulatory Visit
Admission: RE | Admit: 2010-11-24 | Discharge: 2010-11-24 | Disposition: A | Discharge status: Home / Self Care | Payer: Medicare Other | Source: Ambulatory Visit | Attending: Family Medicine | Admitting: Family Medicine

## 2010-11-24 DIAGNOSIS — Z1231 Encounter for screening mammogram for malignant neoplasm of breast: Secondary | ICD-10-CM

## 2010-11-25 LAB — HM MAMMOGRAPHY: HM Mammogram: NORMAL

## 2010-11-29 ENCOUNTER — Telehealth: Payer: Self-pay | Admitting: *Deleted

## 2010-11-29 DIAGNOSIS — H6980 Other specified disorders of Eustachian tube, unspecified ear: Secondary | ICD-10-CM

## 2010-11-29 DIAGNOSIS — J019 Acute sinusitis, unspecified: Secondary | ICD-10-CM

## 2010-11-29 NOTE — Telephone Encounter (Signed)
I do want to refer her to ENT  Will do that and let her know to expect call from [facility]

## 2010-11-29 NOTE — Telephone Encounter (Signed)
Pt states she has had 2 rounds of antibiotic for sinus infection and stopped up eustachian tube.  She says her ear is still stopped up and she is asking what to do.  She wants you to know that she has been taking bactrim for a UTI.  If referred to ENT she prefers to go to the best one- no preference where.

## 2010-11-29 NOTE — Telephone Encounter (Signed)
Patient notified as instructed by telephone. Pt will wait to hear from [facility] and can be reached at [phone].

## 2010-12-08 ENCOUNTER — Telehealth: Payer: Self-pay | Admitting: Family Medicine

## 2010-12-08 DIAGNOSIS — R5381 Other malaise: Secondary | ICD-10-CM

## 2010-12-08 DIAGNOSIS — Z13220 Encounter for screening for lipoid disorders: Secondary | ICD-10-CM | POA: Insufficient documentation

## 2010-12-08 DIAGNOSIS — M899 Disorder of bone, unspecified: Secondary | ICD-10-CM

## 2010-12-08 DIAGNOSIS — E039 Hypothyroidism, unspecified: Secondary | ICD-10-CM

## 2010-12-08 DIAGNOSIS — K219 Gastro-esophageal reflux disease without esophagitis: Secondary | ICD-10-CM

## 2010-12-08 NOTE — Telephone Encounter (Signed)
Message copied by Roxy Manns on Thu Dec 08, 2010  5:21 PM ------      Message from: Liane Comber      Created: Wed Dec 07, 2010 11:35 AM      Regarding: Cpx labs Mon 4/30       Please order  future cpx labs for pt's upcomming lab appt.      Thanks      Rodney Booze

## 2010-12-19 ENCOUNTER — Other Ambulatory Visit (INDEPENDENT_AMBULATORY_CARE_PROVIDER_SITE_OTHER): Payer: Medicare Other | Admitting: Family Medicine

## 2010-12-19 DIAGNOSIS — K219 Gastro-esophageal reflux disease without esophagitis: Secondary | ICD-10-CM

## 2010-12-19 DIAGNOSIS — E039 Hypothyroidism, unspecified: Secondary | ICD-10-CM

## 2010-12-19 DIAGNOSIS — R5381 Other malaise: Secondary | ICD-10-CM

## 2010-12-19 DIAGNOSIS — Z13220 Encounter for screening for lipoid disorders: Secondary | ICD-10-CM

## 2010-12-19 DIAGNOSIS — M899 Disorder of bone, unspecified: Secondary | ICD-10-CM

## 2010-12-19 LAB — CBC WITH DIFFERENTIAL/PLATELET
Basophils Absolute: 0 10*3/uL (ref 0.0–0.1)
Basophils Relative: 0.5 % (ref 0.0–3.0)
Eosinophils Absolute: 0.1 10*3/uL (ref 0.0–0.7)
Eosinophils Relative: 1.3 % (ref 0.0–5.0)
HCT: 41.5 % (ref 36.0–46.0)
Hemoglobin: 14 g/dL (ref 12.0–15.0)
Lymphocytes Relative: 31.7 % (ref 12.0–46.0)
Lymphs Abs: 2 10*3/uL (ref 0.7–4.0)
MCHC: 33.9 g/dL (ref 30.0–36.0)
MCV: 92.4 fl (ref 78.0–100.0)
Monocytes Absolute: 0.5 10*3/uL (ref 0.1–1.0)
Monocytes Relative: 8.2 % (ref 3.0–12.0)
Neutro Abs: 3.6 10*3/uL (ref 1.4–7.7)
Neutrophils Relative %: 58.3 % (ref 43.0–77.0)
Platelets: 247 10*3/uL (ref 150.0–400.0)
RBC: 4.49 Mil/uL (ref 3.87–5.11)
RDW: 13.9 % (ref 11.5–14.6)
WBC: 6.2 10*3/uL (ref 4.5–10.5)

## 2010-12-19 LAB — COMPREHENSIVE METABOLIC PANEL
ALT: 18 U/L (ref 0–35)
AST: 23 U/L (ref 0–37)
Albumin: 4.1 g/dL (ref 3.5–5.2)
Alkaline Phosphatase: 57 U/L (ref 39–117)
BUN: 14 mg/dL (ref 6–23)
CO2: 30 mEq/L (ref 19–32)
Calcium: 9.5 mg/dL (ref 8.4–10.5)
Chloride: 99 mEq/L (ref 96–112)
Creatinine, Ser: 0.8 mg/dL (ref 0.4–1.2)
GFR: 74.4 mL/min (ref >60.00)
Glucose, Bld: 83 mg/dL (ref 70–99)
Potassium: 3.8 mEq/L (ref 3.5–5.1)
Sodium: 137 mEq/L (ref 135–145)
Total Bilirubin: 0.7 mg/dL (ref 0.3–1.2)
Total Protein: 6.7 g/dL (ref 6.0–8.3)

## 2010-12-19 LAB — LIPID PANEL
Cholesterol: 221 mg/dL — ABNORMAL HIGH (ref 0–200)
HDL: 89.7 mg/dL (ref >39.00)
Total CHOL/HDL Ratio: 2
Triglycerides: 68 mg/dL (ref 0.0–149.0)
VLDL: 13.6 mg/dL (ref 0.0–40.0)

## 2010-12-19 LAB — TSH: TSH: 3.54 u[IU]/mL (ref 0.35–5.50)

## 2010-12-19 LAB — LDL CHOLESTEROL, DIRECT: Direct LDL: 109.9 mg/dL

## 2010-12-20 ENCOUNTER — Other Ambulatory Visit (HOSPITAL_COMMUNITY)
Admission: RE | Admit: 2010-12-20 | Discharge: 2010-12-20 | Disposition: A | Discharge status: Home / Self Care | Payer: Medicare Other | Source: Ambulatory Visit | Attending: Family Medicine | Admitting: Family Medicine

## 2010-12-20 ENCOUNTER — Ambulatory Visit (INDEPENDENT_AMBULATORY_CARE_PROVIDER_SITE_OTHER): Payer: Medicare Other | Admitting: Family Medicine

## 2010-12-20 ENCOUNTER — Encounter: Payer: Self-pay | Admitting: Family Medicine

## 2010-12-20 DIAGNOSIS — Z01419 Encounter for gynecological examination (general) (routine) without abnormal findings: Secondary | ICD-10-CM | POA: Insufficient documentation

## 2010-12-20 DIAGNOSIS — Z13220 Encounter for screening for lipoid disorders: Secondary | ICD-10-CM

## 2010-12-20 DIAGNOSIS — N952 Postmenopausal atrophic vaginitis: Secondary | ICD-10-CM

## 2010-12-20 DIAGNOSIS — M899 Disorder of bone, unspecified: Secondary | ICD-10-CM

## 2010-12-20 DIAGNOSIS — Z124 Encounter for screening for malignant neoplasm of cervix: Secondary | ICD-10-CM | POA: Insufficient documentation

## 2010-12-20 DIAGNOSIS — E039 Hypothyroidism, unspecified: Secondary | ICD-10-CM

## 2010-12-20 DIAGNOSIS — K2270 Barrett's esophagus without dysplasia: Secondary | ICD-10-CM

## 2010-12-20 DIAGNOSIS — Z1159 Encounter for screening for other viral diseases: Secondary | ICD-10-CM | POA: Insufficient documentation

## 2010-12-20 LAB — VITAMIN D 25 HYDROXY (VIT D DEFICIENCY, FRACTURES): Vit D, 25-Hydroxy: 65 ng/mL (ref 30–89)

## 2010-12-20 MED ORDER — ESTROGENS, CONJUGATED 0.625 MG/GM VA CREA: TOPICAL_CREAM | VAGINAL | Status: DC

## 2010-12-20 MED ORDER — LEVOTHYROXINE SODIUM 88 MCG PO TABS: 88.0000 ug | ORAL_TABLET | Freq: Every day | ORAL | Status: DC

## 2010-12-20 MED ORDER — BUTALBITAL-APAP-CAFF-COD 50-325-40-30 MG PO CAPS: 2.0000 | ORAL_CAPSULE | Freq: Four times a day (QID) | ORAL | Status: DC | PRN

## 2010-12-20 NOTE — Progress Notes (Signed)
  Subjective:    Patient ID: Sarah Harper, female    DOB: 01/28/1941, 70 y.o.   MRN: [id number]  HPI Here for check up of chronic med problems/ lipoid screen and rev of chronic med problems  Is feeling pretty well  Still not enough energy -- chronic fatigue   All labs look good   Zoster status- still undecided about that  Pneumovax-- not interested in  Td03   Mam 4/12- normal  Self exam- no lumps or problems   colonosc 3/12  Pap-- has not a hysterectomy, and no abn paps in past  Last gyn exam was 10 years ago  No pain or bleeding  Premarin cream is working well - needs new px   dexa- osteopenia -- thinks it was 2 years ago  Has hx of osteopenia and also on PPI and is post menopausal Does not want a dexa yet  Takes one 600 per day- will increase that  1000 mcg D plus what is in her calcium  Ca and D  Wt is up 5 lb- stil good bmi   Hypothyroid - stable with theraputic tsh Clinically-- fatigue is still the same No hair or skin changes No palpitations or sweating   Hx of barretts esoph Nov 09 had egd - Dr Jarold Motto -- did not schedule f/u  colonosc was 10 years ago  Is scheduled with Dr Troy Sine next Tuesday -- to disc this and when she is due for EGD again      Review of Systems Review of Systems  Constitutional: Negative for fever, appetite change, fatigue and unexpected weight change.  Eyes: Negative for pain and visual disturbance.  Respiratory: Negative for cough and shortness of breath.   Cardiovascular: Negative.   Gastrointestinal: Negative for nausea, diarrhea and constipation.  Genitourinary: Negative for urgency and frequency.  Skin: Negative for pallor.  Neurological: Negative for weakness, light-headedness, numbness and headaches.  Hematological: Negative for adenopathy. Does not bruise/bleed easily.  Psychiatric/Behavioral: Negative for dysphoric mood. The patient is not nervous/anxious.         Objective:   Physical Exam  Constitutional:  She is oriented to person, place, and time. She appears well-developed and well-nourished.  HENT:  Head: Normocephalic and atraumatic.  Right Ear: External ear normal.  Left Ear: External ear normal.  Nose: Nose normal.  Mouth/Throat: Oropharynx is clear and moist.  Eyes: Conjunctivae and EOM are normal. Pupils are equal, round, and reactive to light.  Neck: Normal range of motion. Neck supple. No JVD present. No thyromegaly present.  Cardiovascular: Normal rate, regular rhythm and normal heart sounds.   Pulmonary/Chest: Effort normal and breath sounds normal. No respiratory distress. She has no wheezes.  Abdominal: Soft. Bowel sounds are normal. She exhibits no distension. There is no tenderness.  Genitourinary: Vagina normal and uterus normal. No breast swelling, tenderness, discharge or bleeding. No vaginal discharge found.  Musculoskeletal: Normal range of motion. She exhibits no edema and no tenderness.  Lymphadenopathy:    She has no cervical adenopathy.  Neurological: She is alert and oriented to person, place, and time. She has normal reflexes. She displays normal reflexes. She exhibits normal muscle tone. Coordination normal.  Skin: Skin is warm and dry. No rash noted. No erythema. No pallor.  Psychiatric: She has a normal mood and affect.          Assessment & Plan:

## 2010-12-20 NOTE — Assessment & Plan Note (Signed)
Stable clinically  tx tsh Px written- no changes

## 2010-12-20 NOTE — Assessment & Plan Note (Signed)
Exam with pap today prem cream working well for atrophic vaginitis

## 2010-12-20 NOTE — Assessment & Plan Note (Signed)
F/u with Dr Troy Sine to disc this and colon cancer screen No sympt on PPI

## 2010-12-20 NOTE — Assessment & Plan Note (Signed)
Great chol prof Rev low sat fat diet

## 2010-12-20 NOTE — Patient Instructions (Signed)
Keep up the great health habits Let me know when you want a bone density test  Follow up with GI as planned

## 2010-12-27 ENCOUNTER — Encounter: Payer: Self-pay | Admitting: *Deleted

## 2011-01-30 ENCOUNTER — Other Ambulatory Visit: Payer: Self-pay | Admitting: Gastroenterology

## 2011-03-02 ENCOUNTER — Other Ambulatory Visit: Payer: Self-pay | Admitting: Family Medicine

## 2011-03-03 NOTE — Telephone Encounter (Signed)
Will send electronically to [facility]- let her know

## 2011-03-03 NOTE — Telephone Encounter (Signed)
Pt states she needs to stay on nexium twice a day.  Dr.Medoff had tried her on lower doses but that didn't work, so she wants you to know she needs to continue this dose.

## 2011-03-03 NOTE — Telephone Encounter (Signed)
Patient notified as instructed by telephone. 

## 2011-05-23 ENCOUNTER — Other Ambulatory Visit: Payer: Self-pay | Admitting: Neurological Surgery

## 2011-05-23 DIAGNOSIS — M4710 Other spondylosis with myelopathy, site unspecified: Secondary | ICD-10-CM

## 2011-05-23 DIAGNOSIS — M5410 Radiculopathy, site unspecified: Secondary | ICD-10-CM

## 2011-05-29 ENCOUNTER — Other Ambulatory Visit: Payer: Medicare Other

## 2011-05-29 ENCOUNTER — Ambulatory Visit
Admission: RE | Admit: 2011-05-29 | Discharge: 2011-05-29 | Disposition: A | Discharge status: Home / Self Care | Payer: Medicare Other | Source: Ambulatory Visit | Attending: Neurological Surgery | Admitting: Neurological Surgery

## 2011-05-29 DIAGNOSIS — M545 Low back pain, unspecified: Secondary | ICD-10-CM

## 2011-05-29 DIAGNOSIS — M4710 Other spondylosis with myelopathy, site unspecified: Secondary | ICD-10-CM

## 2011-05-29 DIAGNOSIS — M5410 Radiculopathy, site unspecified: Secondary | ICD-10-CM

## 2011-05-29 MED ORDER — DIAZEPAM 5 MG PO TABS
5.0000 mg | ORAL_TABLET | Freq: Once | ORAL | Status: AC
  Administered 2011-05-29: 5 mg via ORAL

## 2011-05-29 MED ORDER — ONDANSETRON HCL 4 MG/2ML IJ SOLN: 4.0000 mg | Freq: Four times a day (QID) | INTRAMUSCULAR | Status: DC | PRN

## 2011-05-29 MED ORDER — MEPERIDINE HCL 100 MG/ML IJ SOLN
50.0000 mg | Freq: Once | INTRAMUSCULAR | Status: AC
  Administered 2011-05-29: 50 mg via INTRAMUSCULAR

## 2011-05-29 MED ORDER — IOHEXOL 180 MG/ML  SOLN
15.0000 mL | Freq: Once | INTRAMUSCULAR | Status: AC | PRN
  Administered 2011-05-29: 15 mL via INTRATHECAL

## 2011-05-29 MED ORDER — ONDANSETRON HCL 4 MG/2ML IJ SOLN
4.0000 mg | Freq: Once | INTRAMUSCULAR | Status: AC
  Administered 2011-05-29: 4 mg via INTRAMUSCULAR

## 2011-05-29 NOTE — Patient Instructions (Signed)
Myelogram and Lumbar Puncture Discharge Instructions  1. Go home and rest quietly for the next 24 hours.  It is important to lie flat for the next 24 hours.  Get up only to go to the restroom.  You may lie in the bed or on a couch on your back, your stomach, your left side or your right side.  You may have one pillow under your head.  You may have pillows between your knees while you are on your side or under your knees while you are on your back.  2. DO NOT drive today.  Recline the seat as far back as it will go, while still wearing your seat belt, on the way home.  3. You may get up to go to the bathroom as needed.  You may sit up for 10 minutes to eat.  You may resume your normal diet and medications unless otherwise indicated.  4. The incidence of headache, nausea, or vomiting is about 5% (one in 20 patients).  If you develop a headache, lie flat and drink plenty of fluids until the headache goes away.  Caffeinated beverages may be helpful.  If you develop severe nausea and vomiting or a headache that does not go away with flat bed rest, call [phone].  5. You may resume normal activities after your 24 hours of bed rest is over; however, do not exert yourself strongly or do any heavy lifting tomorrow.  6. Call your physician for a follow-up appointment.  The results of your myelogram will be sent directly to your physician by the following day.  7. If you have any questions or if complications develop after you arrive home, please call [phone].  Discharge instructions have been explained to the patient.  The patient, or the person responsible for the patient, fully understands these instructions.  

## 2011-06-07 ENCOUNTER — Other Ambulatory Visit: Payer: Self-pay | Admitting: *Deleted

## 2011-06-07 MED ORDER — BUTALBITAL-APAP-CAFF-COD 50-325-40-30 MG PO CAPS: ORAL_CAPSULE | ORAL | Status: DC

## 2011-06-07 NOTE — Telephone Encounter (Signed)
Px written for call in   

## 2011-06-07 NOTE — Telephone Encounter (Signed)
Faxed request from [facility], last filled 03/14/11.

## 2011-06-07 NOTE — Telephone Encounter (Signed)
Medication phoned to [facility] as instructed.

## 2011-12-13 ENCOUNTER — Ambulatory Visit (INDEPENDENT_AMBULATORY_CARE_PROVIDER_SITE_OTHER): Payer: Medicare Other | Admitting: Family Medicine

## 2011-12-13 ENCOUNTER — Encounter: Payer: Self-pay | Admitting: Family Medicine

## 2011-12-13 VITALS — BP 130/82 | HR 78 | Temp 97.9°F | Ht 65.5 in | Wt 137.2 lb

## 2011-12-13 DIAGNOSIS — R5381 Other malaise: Secondary | ICD-10-CM

## 2011-12-13 DIAGNOSIS — M949 Disorder of cartilage, unspecified: Secondary | ICD-10-CM

## 2011-12-13 DIAGNOSIS — R5383 Other fatigue: Secondary | ICD-10-CM

## 2011-12-13 DIAGNOSIS — E039 Hypothyroidism, unspecified: Secondary | ICD-10-CM

## 2011-12-13 DIAGNOSIS — K909 Intestinal malabsorption, unspecified: Secondary | ICD-10-CM

## 2011-12-13 DIAGNOSIS — M899 Disorder of bone, unspecified: Secondary | ICD-10-CM

## 2011-12-13 LAB — COMPREHENSIVE METABOLIC PANEL
ALT: 18 U/L (ref 0–35)
AST: 22 U/L (ref 0–37)
Albumin: 4.3 g/dL (ref 3.5–5.2)
Alkaline Phosphatase: 60 U/L (ref 39–117)
BUN: 17 mg/dL (ref 6–23)
CO2: 27 mEq/L (ref 19–32)
Calcium: 9.6 mg/dL (ref 8.4–10.5)
Chloride: 102 mEq/L (ref 96–112)
Creatinine, Ser: 0.7 mg/dL (ref 0.4–1.2)
GFR: 92.35 mL/min (ref >60.00)
Glucose, Bld: 83 mg/dL (ref 70–99)
Potassium: 3.8 mEq/L (ref 3.5–5.1)
Sodium: 139 mEq/L (ref 135–145)
Total Bilirubin: 0.5 mg/dL (ref 0.3–1.2)
Total Protein: 7.2 g/dL (ref 6.0–8.3)

## 2011-12-13 LAB — CBC WITH DIFFERENTIAL/PLATELET
Basophils Absolute: 0.1 10*3/uL (ref 0.0–0.1)
Basophils Relative: 0.9 % (ref 0.0–3.0)
Eosinophils Absolute: 0.2 10*3/uL (ref 0.0–0.7)
Eosinophils Relative: 4.3 % (ref 0.0–5.0)
HCT: 40.8 % (ref 36.0–46.0)
Hemoglobin: 13.6 g/dL (ref 12.0–15.0)
Lymphocytes Relative: 30.1 % (ref 12.0–46.0)
Lymphs Abs: 1.6 10*3/uL (ref 0.7–4.0)
MCHC: 33.3 g/dL (ref 30.0–36.0)
MCV: 92.2 fl (ref 78.0–100.0)
Monocytes Absolute: 0.4 10*3/uL (ref 0.1–1.0)
Monocytes Relative: 8.1 % (ref 3.0–12.0)
Neutro Abs: 3.1 10*3/uL (ref 1.4–7.7)
Neutrophils Relative %: 56.6 % (ref 43.0–77.0)
Platelets: 252 10*3/uL (ref 150.0–400.0)
RBC: 4.43 Mil/uL (ref 3.87–5.11)
RDW: 14.1 % (ref 11.5–14.6)
WBC: 5.4 10*3/uL (ref 4.5–10.5)

## 2011-12-13 LAB — VITAMIN B12: Vitamin B-12: 728 pg/mL (ref 211–911)

## 2011-12-13 LAB — TSH: TSH: 4.6 u[IU]/mL (ref 0.35–5.50)

## 2011-12-13 NOTE — Assessment & Plan Note (Signed)
Vit D level today Will disc at annual exam

## 2011-12-13 NOTE — Patient Instructions (Signed)
Labs today for fatigue  We want to work up to 5 days of exercise a week 30 minutes --  And I think water exercise or recumbent bike would be good things to try

## 2011-12-13 NOTE — Progress Notes (Signed)
Subjective:    Patient ID: Sarah Harper, female    DOB: 07-10-1941, 71 y.o.   MRN: [id number]  HPI Is here for fatigue for a month Has not had tsh checked in 1 year  Skin is always very dry (and thin) No hair loss Feels a little down - ? If a bit depressed  Generally fatigued and listless   Also not able to walk as much due to chronic back pain  This is very stressful - and may add to the fatigue Is going to chiropractor and has some exercises to do     Tripped and hit her face -on the sidewalk Doing ok - band aid on nose  No headache or dizziness or nausea  Face is not painful   Patient Active Problem List  Diagnoses  . HYPOTHYROIDISM  . MENIERE'S DISEASE  . GERD  . IRRITABLE BOWEL SYNDROME  . BACK PAIN, LUMBAR  . OSTEOPENIA  . FATIGUE  . HEADACHE  . POSTMENOPAUSAL STATUS  . BARRETTS ESOPHAGUS  . Eustachian tube dysfunction  . Screening for lipoid disorders  . Gynecological examination   Past Medical History  Diagnosis Date  . GERD (gastroesophageal reflux disease)   . Thyroid disease     hypothyroid  . Depression   . IBS (irritable bowel syndrome)   . ETD (eustachian tube dysfunction)   . Meniere's disease   . Migraine   . Osteoporosis   . DDD (degenerative disc disease)     chronic back pain   Past Surgical History  Procedure Date  . Lumbar disc surgery     L5   History  Substance Use Topics  . Smoking status: Never Smoker   . Smokeless tobacco: Not on file  . Alcohol Use: 0.6 oz/week    1 Glasses of wine per week   Family History  Problem Relation Age of Onset  . Diabetes Paternal Aunt   . Hypertension Maternal Grandmother   . Hypertension Maternal Grandfather    Allergies  Allergen Reactions  . Epinephrine    Current Outpatient Prescriptions on File Prior to Visit  Medication Sig Dispense Refill  . butalbital-acetaminophen-caffeine (FIORICET WITH CODEINE) 50-325-40-30 MG per capsule 1-2 tablets by mouth up to every 4-6 hours as  needed  30 capsule  2  . Calcium Carbonate 800 MG/2GM POWD Take 1 capsule by mouth daily.        . Cholecalciferol (VITAMIN D) 1000 UNITS capsule Take 1,000 Units by mouth daily.        Marland Kitchen conjugated estrogens (PREMARIN) vaginal cream Apply small amount vaginally 2-3 times a week.  42.5 g  3  . cyanocobalamin 1000 MCG tablet Take 1,000 mcg by mouth daily.        Marland Kitchen esomeprazole (NEXIUM) 40 MG packet Take 40 mg by mouth 2 (two) times daily.        . fluticasone (FLONASE) 50 MCG/ACT nasal spray 2 sprays by Nasal route daily.        Marland Kitchen levothyroxine (SYNTHROID, LEVOTHROID) 88 MCG tablet Take 1 tablet (88 mcg total) by mouth daily.  90 tablet  3  . methylcellulose (CITRUCEL) oral powder As directed as needed       . Probiotic Product (ALIGN) 4 MG CAPS Take one capsule by mouth daily for one week out of every month           Review of Systems Review of Systems  Constitutional: Negative for fever, appetite change, unexpected weight change. pos for general fatigue  Eyes: Negative for pain and visual disturbance.  Respiratory: Negative for cough and shortness of breath.   Cardiovascular: Negative for cp or palpitations    Gastrointestinal: Negative for nausea, diarrhea and constipation.  Genitourinary: Negative for urgency and frequency.  Skin: Negative for pallor or rash   MSK pos for back pain, neg for swollen or painful joints  Neurological: Negative for weakness, light-headedness, numbness and headaches.  Hematological: Negative for adenopathy. Does not bruise/bleed easily.  Psychiatric/Behavioral: Negative for dysphoric mood. The patient is not nervous/anxious.         Objective:   Physical Exam  Constitutional: She appears well-developed and well-nourished. No distress.  HENT:  Head: Normocephalic and atraumatic.  Mouth/Throat: Oropharynx is clear and moist.  Eyes: Conjunctivae and EOM are normal. Pupils are equal, round, and reactive to light. Right eye exhibits no discharge. Left eye  exhibits no discharge.  Neck: Normal range of motion. Neck supple. No JVD present. Carotid bruit is not present. No thyromegaly present.  Cardiovascular: Normal rate, regular rhythm, normal heart sounds and intact distal pulses.  Exam reveals no gallop.   Pulmonary/Chest: Effort normal and breath sounds normal. No respiratory distress. She has no wheezes.  Abdominal: Soft. Bowel sounds are normal. She exhibits no distension, no abdominal bruit and no mass. There is no tenderness.  Musculoskeletal: She exhibits no edema.       No kyphosis  Poor rom LS  Lymphadenopathy:    She has no cervical adenopathy.  Neurological: She is alert. She has normal reflexes. No cranial nerve deficit. She exhibits normal muscle tone. Coordination normal.  Skin: Skin is warm and dry. No rash noted. No erythema. No pallor.  Psychiatric: She has a normal mood and affect.          Assessment & Plan:

## 2011-12-13 NOTE — Assessment & Plan Note (Signed)
Some inc fatigue and dry skin  tsh today and update

## 2011-12-13 NOTE — Assessment & Plan Note (Signed)
Likely multifactorial Lab today incl B12 due to abn intest abs- from PPI Suspect chronic back pain and less exercise plays a role Made some suggestions- see AVS

## 2011-12-14 LAB — VITAMIN D 25 HYDROXY (VIT D DEFICIENCY, FRACTURES): Vit D, 25-Hydroxy: 58 ng/mL (ref 30–89)

## 2011-12-20 ENCOUNTER — Other Ambulatory Visit: Payer: Medicare Other

## 2011-12-22 ENCOUNTER — Encounter: Payer: Self-pay | Admitting: Family Medicine

## 2011-12-22 ENCOUNTER — Ambulatory Visit (INDEPENDENT_AMBULATORY_CARE_PROVIDER_SITE_OTHER): Payer: Medicare Other | Admitting: Family Medicine

## 2011-12-22 VITALS — BP 138/82 | HR 71 | Temp 97.8°F | Ht 65.5 in | Wt 138.8 lb

## 2011-12-22 DIAGNOSIS — E039 Hypothyroidism, unspecified: Secondary | ICD-10-CM

## 2011-12-22 DIAGNOSIS — Z23 Encounter for immunization: Secondary | ICD-10-CM

## 2011-12-22 DIAGNOSIS — M899 Disorder of bone, unspecified: Secondary | ICD-10-CM

## 2011-12-22 DIAGNOSIS — K219 Gastro-esophageal reflux disease without esophagitis: Secondary | ICD-10-CM

## 2011-12-22 DIAGNOSIS — Z13220 Encounter for screening for lipoid disorders: Secondary | ICD-10-CM

## 2011-12-22 DIAGNOSIS — M949 Disorder of cartilage, unspecified: Secondary | ICD-10-CM

## 2011-12-22 MED ORDER — BUTALBITAL-APAP-CAFFEINE 50-325-40 MG PO TABS: 1.0000 | ORAL_TABLET | Freq: Two times a day (BID) | ORAL | Status: DC | PRN

## 2011-12-22 MED ORDER — LEVOTHYROXINE SODIUM 88 MCG PO TABS: 88.0000 ug | ORAL_TABLET | Freq: Every day | ORAL | Status: DC

## 2011-12-22 NOTE — Assessment & Plan Note (Signed)
Last lipids ok  Disc goals for lipids and reasons to control them Rev labs with pt Rev low sat fat diet in detail

## 2011-12-22 NOTE — Progress Notes (Signed)
Subjective:    Patient ID: Sarah Harper, female    DOB: [birth year], 71 y.o.   MRN: [id number]  HPI Here for check up of chronic medical conditions and to review health mt list   Had a fall - is slowing getting back to normal  Spot on hand - wants to look at  Did not break anything    bp is 138/82    Today No cp or palpitations or headaches or edema  No side effects to medicines    Wt is up 1 lb with bmi of 22- stable   Gerd/ baretts- is on nexium -- takes it bid most of the time  Align helps too  Lab Results  Component Value Date   WBC 5.4 12/13/2011   HGB 13.6 12/13/2011   HCT 40.8 12/13/2011   MCV 92.2 12/13/2011   PLT 252.0 12/13/2011   no further egd planned   Hypothyroid Lab Results  Component Value Date   TSH 4.60 12/13/2011   feels the same - does get a bit tired / sleepy at times- thinks it is age related  B 12 level ok   ostopenia  D level good in 50s on current dose  More than 2 years since last dexa  She will schedule her own later   Lipid screen Lab Results  Component Value Date   CHOL 221* 12/19/2010   HDL 89.70 12/19/2010   LDLDIRECT 109.9 12/19/2010   TRIG 68.0 12/19/2010   CHOLHDL 2 12/19/2010     Zoster status- is interested in shingles vaccine -- gets 80 % coverage? In office or pharmacy Pneumovax - wants to go ahead and get   Td due this year- will do that today   Flu shot - got in oct   colonosc 3/12 Saw Dr Troy Sine- and really likes him   Pap 4/11 No problems or hx of cervical cancer   mammo 4/12 -will schedule her own  Self exam - no lumps or changes   Patient Active Problem List  Diagnoses  . HYPOTHYROIDISM  . MENIERE'S DISEASE  . GERD  . IRRITABLE BOWEL SYNDROME  . BACK PAIN, LUMBAR  . OSTEOPENIA  . FATIGUE  . HEADACHE  . POSTMENOPAUSAL STATUS  . BARRETTS ESOPHAGUS  . Eustachian tube dysfunction  . Screening for lipoid disorders  . Gynecological examination   Past Medical History  Diagnosis Date  . GERD  (gastroesophageal reflux disease)   . Thyroid disease     hypothyroid  . Depression   . IBS (irritable bowel syndrome)   . ETD (eustachian tube dysfunction)   . Meniere's disease   . Migraine   . Osteoporosis   . DDD (degenerative disc disease)     chronic back pain   Past Surgical History  Procedure Date  . Lumbar disc surgery     L5   History  Substance Use Topics  . Smoking status: Never Smoker   . Smokeless tobacco: Not on file  . Alcohol Use: 0.6 oz/week    1 Glasses of wine per week   Family History  Problem Relation Age of Onset  . Diabetes Paternal Aunt   . Hypertension Maternal Grandmother   . Hypertension Maternal Grandfather    Allergies  Allergen Reactions  . Epinephrine    Current Outpatient Prescriptions on File Prior to Visit  Medication Sig Dispense Refill  . Calcium Carbonate 800 MG/2GM POWD Take 1 capsule by mouth daily.        . Cholecalciferol (  VITAMIN D) 1000 UNITS capsule Take 1,000 Units by mouth daily.        Marland Kitchen conjugated estrogens (PREMARIN) vaginal cream Apply small amount vaginally 2-3 times a week.  42.5 g  3  . cyanocobalamin 1000 MCG tablet Take one tablet three times a week      . esomeprazole (NEXIUM) 40 MG packet Take 40 mg by mouth 2 (two) times daily.        . fluticasone (FLONASE) 50 MCG/ACT nasal spray 2 sprays by Nasal route daily.        . methylcellulose (CITRUCEL) oral powder As directed as needed       . Probiotic Product (ALIGN) 4 MG CAPS Take one capsule by mouth daily for one week out of every month       . DISCONTD: levothyroxine (SYNTHROID, LEVOTHROID) 88 MCG tablet Take 1 tablet (88 mcg total) by mouth daily.  90 tablet  3     Review of Systems Review of Systems  Constitutional: Negative for fever, appetite change, fatigue and unexpected weight change.  Eyes: Negative for pain and visual disturbance.  Respiratory: Negative for cough and shortness of breath.   Cardiovascular: Negative for cp or palpitations      Gastrointestinal: Negative for nausea, diarrhea and constipation.  Genitourinary: Negative for urgency and frequency.  Skin: Negative for pallor or rash  pos for scab on R hand from fall that is improving  Neurological: Negative for weakness, light-headedness, numbness and headaches.  Hematological: Negative for adenopathy. Does not bruise/bleed easily.  Psychiatric/Behavioral: Negative for dysphoric mood. The patient is not nervous/anxious.         Objective:   Physical Exam  Constitutional: She appears well-developed and well-nourished. No distress.  HENT:  Head: Normocephalic and atraumatic.  Mouth/Throat: Oropharynx is clear and moist.  Eyes: Conjunctivae and EOM are normal. Pupils are equal, round, and reactive to light. No scleral icterus.  Neck: Normal range of motion. Neck supple. No JVD present. Carotid bruit is not present. No thyromegaly present.  Cardiovascular: Normal rate, regular rhythm, normal heart sounds and intact distal pulses.   Pulmonary/Chest: Effort normal and breath sounds normal. No respiratory distress. She has no wheezes. She exhibits no tenderness.  Abdominal: Soft. Bowel sounds are normal. She exhibits no distension, no abdominal bruit and no mass. There is no tenderness.  Genitourinary: No breast swelling, tenderness, discharge or bleeding.       Breast exam: No mass, nodules, thickening, tenderness, bulging, retraction, inflamation, nipple discharge or skin changes noted.  No axillary or clavicular LA.  Chaperoned exam.    Musculoskeletal: Normal range of motion. She exhibits no edema and no tenderness.  Lymphadenopathy:    She has no cervical adenopathy.  Neurological: She is alert. She has normal reflexes. No cranial nerve deficit. She exhibits normal muscle tone. Coordination normal.  Skin: Skin is warm and dry. No rash noted. No erythema. No pallor.       Solar lentigos diffusely   Psychiatric: She has a normal mood and affect.           Assessment & Plan:

## 2011-12-22 NOTE — Patient Instructions (Signed)
Do not forget to schedule your own mammogram and bone density tests - you are due  If you are interested in a shingles/zoster vaccine - call your insurance to check on coverage,( you should not get it within 1 month of other vaccines) , then call us for a prescription  for it to take to a pharmacy that gives the shot   tetanus and pneumonia vaccines today

## 2011-12-22 NOTE — Assessment & Plan Note (Signed)
Due for dexa Vit D level ok  No fractures - even with recent trip and fall  Rev cal adn D She will schedule her own dexa

## 2011-12-22 NOTE — Assessment & Plan Note (Signed)
Stable, theraputic tsh  No symptoms  Rev labs

## 2011-12-22 NOTE — Assessment & Plan Note (Signed)
Good control with nexium bid  Continues to see GI Dr Troy Sine

## 2012-02-02 ENCOUNTER — Telehealth: Payer: Self-pay | Admitting: Family Medicine

## 2012-02-02 ENCOUNTER — Other Ambulatory Visit: Payer: Self-pay | Admitting: Family Medicine

## 2012-02-02 DIAGNOSIS — M899 Disorder of bone, unspecified: Secondary | ICD-10-CM

## 2012-02-02 DIAGNOSIS — Z1231 Encounter for screening mammogram for malignant neoplasm of breast: Secondary | ICD-10-CM

## 2012-02-02 NOTE — Telephone Encounter (Signed)
Patient had her last Bone Density at [facility] on 01/21/2009. She doesn't want to go back there. Please put a new Bone Density order in and choose [facility]. She wants to make her own appts and the Breast ctr already put the order in for her MMG just needs Bone Density order.

## 2012-02-04 NOTE — Telephone Encounter (Signed)
Here is order for dexa 

## 2012-02-05 ENCOUNTER — Encounter: Payer: Self-pay | Admitting: Family Medicine

## 2012-02-05 ENCOUNTER — Ambulatory Visit (INDEPENDENT_AMBULATORY_CARE_PROVIDER_SITE_OTHER): Payer: Medicare Other | Admitting: Family Medicine

## 2012-02-05 ENCOUNTER — Ambulatory Visit (INDEPENDENT_AMBULATORY_CARE_PROVIDER_SITE_OTHER)
Admission: RE | Admit: 2012-02-05 | Discharge: 2012-02-05 | Disposition: A | Discharge status: Home / Self Care | Payer: Medicare Other | Source: Ambulatory Visit | Attending: Family Medicine | Admitting: Family Medicine

## 2012-02-05 VITALS — BP 136/84 | HR 72 | Temp 98.3°F | Resp 20 | Ht 65.5 in | Wt 135.5 lb

## 2012-02-05 DIAGNOSIS — R059 Cough, unspecified: Secondary | ICD-10-CM

## 2012-02-05 DIAGNOSIS — R05 Cough: Secondary | ICD-10-CM

## 2012-02-05 NOTE — Progress Notes (Signed)
Nature conservation officer at [facility] [address] [place] [address] Phone: [phone] Fax: [phone]   Patient Name: Sarah Harper Date of Birth: 1940/12/10 Age: 71 y.o. Medical Record Number: [id number] Gender: female Date of Encounter: 02/05/2012  History of Present Illness:  Sarah Harper is a 71 y.o. very pleasant female patient who presents with the following:  Cough  Very pleasant patient who was at her chiropractor's office a few days ago, and he mentioned that he thought he may hear some rumblings in her lung. She has a slight cough, and was concerned that she could be getting sick or potentially have pneumonia or some other process, and she is going out of town to [place] in a few days.  She is afebrile. She does feel somewhat tired, but has minimal runny nose at this point. No earache, sore throat, nausea, vomiting, diarrhea, or other symptoms.  Past Medical History, Surgical History, Social History, Family History, Problem List, Medications, and Allergies have been reviewed and updated if relevant.  Prior to Admission medications   Medication Sig Start Date End Date Taking? Authorizing Provider  Calcium Carbonate 800 MG/2GM POWD Take 2 capsules by mouth daily.    Yes Historical Provider, MD  Cholecalciferol (VITAMIN D) 1000 UNITS capsule Take 1,000 Units by mouth daily.     Yes Historical Provider, MD  conjugated estrogens (PREMARIN) vaginal cream Apply small amount vaginally 2-3 times a week. 12/20/10  Yes Judy Pimple, MD  cyanocobalamin 1000 MCG tablet Take one tablet three times a week   Yes Historical Provider, MD  esomeprazole (NEXIUM) 40 MG packet Take 40 mg by mouth 2 (two) times daily.     Yes Historical Provider, MD  levothyroxine (SYNTHROID, LEVOTHROID) 88 MCG tablet Take 1 tablet (88 mcg total) by mouth daily. 12/22/11  Yes Marne A Tower, MD  lidocaine (XYLOCAINE) 5 % ointment Apply 1 application topically as needed.   Yes Historical Provider,  MD  Probiotic Product (ALIGN) 4 MG CAPS Take one capsule by mouth daily for one week out of every month    Yes Historical Provider, MD  fluticasone (FLONASE) 50 MCG/ACT nasal spray 2 sprays by Nasal route daily.      Historical Provider, MD  methylcellulose (CITRUCEL) oral powder As directed as needed     Historical Provider, MD    Review of Systems:  GEN: No acute illnesses, no fevers, chills. GI: No n/v/d, eating normally Pulm: No SOB Interactive and getting along well at home.  Otherwise, ROS is as per the HPI.   Physical Examination: Filed Vitals:   02/05/12 1614  BP: 136/84  Pulse: 72  Temp: 98.3 F (36.8 C)  Resp: 20   Filed Vitals:   02/05/12 1614  Height: 5' 5.5" (1.664 m)  Weight: 135 lb 8 oz (61.462 kg)   Body mass index is 22.21 kg/(m^2). Ideal Body Weight: Weight in (lb) to have BMI = 25: 152.2    GEN: A and O x 3. WDWN. NAD.    ENT: Nose clear, ext NML.  No LAD.  No JVD.  TM's clear. Oropharynx clear.  PULM: Normal WOB, no distress. No crackles, wheezes, rhonchi. CV: RRR, no M/G/R, No rubs, No JVD.   EXT: warm and well-perfused, No c/c/e. PSYCH: Pleasant and conversant.   Assessment and Plan: 1. Cough  DG Chest 2 View    Reassured the patient. Her lung exam is perfectly clear and she has a normal chest x-ray. It is  possible she is in the early steps of a viral illness, if she has some worsening cough, advise her to take some Robitussin. If she gets worse while she is out of town, with worsening cough, production of phlegm and some fever, think it is reasonable for her to call and I told her that I could call in some antibiotics and asked that message be sent  To me.  Dg Chest 2 View  02/05/2012  *RADIOLOGY REPORT*  Clinical Data: Cough  CHEST - 2 VIEW  Comparison: None.  Findings: The heart size and mediastinal contours are within normal limits.  Both lungs are clear.  The visualized skeletal structures are unremarkable.  IMPRESSION: No active  cardiopulmonary abnormalities.  Original Report Authenticated By: Rosealee Albee, M.D.    Hannah Beat, MD

## 2012-03-19 ENCOUNTER — Ambulatory Visit
Admission: RE | Admit: 2012-03-19 | Discharge: 2012-03-19 | Disposition: A | Discharge status: Home / Self Care | Payer: Medicare Other | Source: Ambulatory Visit | Attending: Family Medicine | Admitting: Family Medicine

## 2012-03-19 DIAGNOSIS — M949 Disorder of cartilage, unspecified: Secondary | ICD-10-CM

## 2012-03-19 DIAGNOSIS — M899 Disorder of bone, unspecified: Secondary | ICD-10-CM

## 2012-03-19 DIAGNOSIS — Z1231 Encounter for screening mammogram for malignant neoplasm of breast: Secondary | ICD-10-CM

## 2012-03-28 ENCOUNTER — Other Ambulatory Visit: Payer: Self-pay

## 2012-03-28 MED ORDER — LIDOCAINE 5 % EX OINT: 1.0000 "application " | TOPICAL_OINTMENT | CUTANEOUS | Status: DC | PRN

## 2012-03-28 NOTE — Telephone Encounter (Signed)
Will refill electronically  

## 2012-03-28 NOTE — Telephone Encounter (Signed)
Pt left v/m requesting Lidocaine ointment 3 tubes for 90 day supply [facility].Please advise.

## 2012-07-01 ENCOUNTER — Other Ambulatory Visit: Payer: Self-pay | Admitting: *Deleted

## 2012-07-01 MED ORDER — BUTALBITAL-APAP-CAFFEINE 50-325-40 MG PO TABS: 1.0000 | ORAL_TABLET | Freq: Two times a day (BID) | ORAL | Status: AC | PRN

## 2012-07-01 NOTE — Telephone Encounter (Signed)
Rx called in as prescribed 

## 2012-07-01 NOTE — Telephone Encounter (Signed)
Px written for call in   

## 2012-07-03 ENCOUNTER — Other Ambulatory Visit: Payer: Self-pay | Admitting: *Deleted

## 2012-08-05 ENCOUNTER — Ambulatory Visit (INDEPENDENT_AMBULATORY_CARE_PROVIDER_SITE_OTHER): Payer: Medicare Other | Admitting: Family Medicine

## 2012-08-05 ENCOUNTER — Encounter: Payer: Self-pay | Admitting: Family Medicine

## 2012-08-05 VITALS — BP 130/82 | HR 72 | Temp 98.2°F | Ht 65.5 in | Wt 137.2 lb

## 2012-08-05 DIAGNOSIS — I499 Cardiac arrhythmia, unspecified: Secondary | ICD-10-CM | POA: Insufficient documentation

## 2012-08-05 NOTE — Progress Notes (Signed)
Subjective:    Patient ID: Sarah Harper, female    DOB: [birth year], 71 y.o.   MRN: [id number]  HPI Here for hx of irregular HR   Was taking some pain meds from Dr Ethelene Hal- cymbalta 30 mg and tramadol 50 mg (1/2 prn)  Then started having worse reflux symptoms Quit meds Went to see Dr Troy Sine- she is a long term pt of his  She was told she needed EGD but he detected an irregular heart rate   She was not conscious of it  She had felt a little dizzy occasionally - fleeting , and also she was not sure if the fullness she gets in her chest is gerd or not  This tends to go along with dizziness   Does not think caffeine is a factor -- 6 oz of reg coffee in am  occ chocolate  Takes fioricet infrequently  Lab Results  Component Value Date   TSH 4.60 12/13/2011     He changed her nexium to dexilant - so far so good   Patient Active Problem List  Diagnosis  . HYPOTHYROIDISM  . MENIERE'S DISEASE  . GERD  . IRRITABLE BOWEL SYNDROME  . BACK PAIN, LUMBAR  . OSTEOPENIA  . FATIGUE  . HEADACHE  . POSTMENOPAUSAL STATUS  . BARRETTS ESOPHAGUS  . Eustachian tube dysfunction  . Screening for lipoid disorders  . Gynecological examination  . Irregular heart beat   Past Medical History  Diagnosis Date  . GERD (gastroesophageal reflux disease)   . Thyroid disease     hypothyroid  . Depression   . IBS (irritable bowel syndrome)   . ETD (eustachian tube dysfunction)   . Meniere's disease   . Migraine   . Osteoporosis   . DDD (degenerative disc disease)     chronic back pain   Past Surgical History  Procedure Date  . Lumbar disc surgery     L5   History  Substance Use Topics  . Smoking status: Never Smoker   . Smokeless tobacco: Not on file  . Alcohol Use: 0.6 oz/week    1 Glasses of wine per week     Comment: wine occasional   Family History  Problem Relation Age of Onset  . Diabetes Paternal Aunt   . Hypertension Maternal Grandmother   . Hypertension Maternal  Grandfather    Allergies  Allergen Reactions  . Epinephrine    Current Outpatient Prescriptions on File Prior to Visit  Medication Sig Dispense Refill  . Calcium Carbonate 800 MG/2GM POWD Take 2 capsules by mouth daily.       . Cholecalciferol (VITAMIN D) 1000 UNITS capsule Take 1,000 Units by mouth daily.        Marland Kitchen conjugated estrogens (PREMARIN) vaginal cream Apply small amount vaginally 2-3 times a week.  42.5 g  3  . cyanocobalamin 1000 MCG tablet Take one tablet three times a week      . dexlansoprazole (DEXILANT) 60 MG capsule Take 60 mg by mouth daily.      . fluticasone (FLONASE) 50 MCG/ACT nasal spray 2 sprays by Nasal route daily.        Marland Kitchen levothyroxine (SYNTHROID, LEVOTHROID) 88 MCG tablet Take 1 tablet (88 mcg total) by mouth daily.  90 tablet  3  . lidocaine (XYLOCAINE) 5 % ointment Apply 1 application topically as needed.  50 g  3  . methylcellulose (CITRUCEL) oral powder As directed as needed       . Probiotic Product (ALIGN)  4 MG CAPS Take one capsule by mouth daily for one week out of every month          Review of SystemsReview of Systems  Constitutional: Negative for fever, appetite change, fatigue and unexpected weight change.  Eyes: Negative for pain and visual disturbance.  Respiratory: Negative for cough and shortness of breath.   Cardiovascular pos for palpitations with occ chest fullness and dizziness/neg for sob/ neg for PND / orthopnea or pedal edema    Gastrointestinal: Negative for nausea, diarrhea and constipation.pos for heartburn  Genitourinary: Negative for urgency and frequency.  Skin: Negative for pallor or rash   Neurological: Negative for weakness, numbness and headaches.  Hematological: Negative for adenopathy. Does not bruise/bleed easily.  Psychiatric/Behavioral: Negative for dysphoric mood. The patient is not nervous/anxious.         Objective:   Physical Exam  Constitutional: She appears well-developed and well-nourished. No distress.   HENT:  Head: Normocephalic and atraumatic.  Right Ear: External ear normal.  Left Ear: External ear normal.  Mouth/Throat: Oropharynx is clear and moist.  Eyes: Conjunctivae normal and EOM are normal. Pupils are equal, round, and reactive to light. Right eye exhibits no discharge. Left eye exhibits no discharge. No scleral icterus.  Neck: Normal range of motion. Neck supple. No JVD present. Carotid bruit is not present. No thyromegaly present.  Cardiovascular: Normal rate, regular rhythm, normal heart sounds and intact distal pulses.  Exam reveals no gallop.   Pulmonary/Chest: Effort normal and breath sounds normal. No respiratory distress. She has no wheezes.  Abdominal: Soft. Bowel sounds are normal. She exhibits no distension, no abdominal bruit and no mass. There is no tenderness.  Musculoskeletal: She exhibits no edema and no tenderness.  Lymphadenopathy:    She has no cervical adenopathy.  Neurological: She is alert. She has normal reflexes.  Skin: Skin is warm and dry. No rash noted. No erythema. No pallor.  Psychiatric: She has a normal mood and affect.          Assessment & Plan:

## 2012-08-05 NOTE — Assessment & Plan Note (Addendum)
Nl EKG with rate 78 NSR and signs of R atrial enl This was detected at her GI visit - at that time she felt dizzy, and this has reoccured (dizziness with irregular pulse when she checks it at home) Reassuring exam today  Disc caffeine Will ref to cardiol for further eval  Will need reassurance before she plans her EGD

## 2012-08-05 NOTE — Patient Instructions (Addendum)
We will do a cardiology referral at check out  If you have another epidsode of palpitations that does not resolve (with dizziness or other symptoms)- please have someone take you to the ER  Stay away from caffeine

## 2012-08-19 ENCOUNTER — Other Ambulatory Visit: Payer: Self-pay | Admitting: Family Medicine

## 2012-08-19 MED ORDER — DEXLANSOPRAZOLE 60 MG PO CPDR: 60.0000 mg | DELAYED_RELEASE_CAPSULE | Freq: Every day | ORAL | Status: DC

## 2012-08-27 ENCOUNTER — Ambulatory Visit (INDEPENDENT_AMBULATORY_CARE_PROVIDER_SITE_OTHER): Payer: Medicare Other | Admitting: Cardiovascular Disease

## 2012-08-27 ENCOUNTER — Encounter: Payer: Self-pay | Admitting: Cardiovascular Disease

## 2012-08-27 VITALS — BP 120/84 | HR 71 | Ht 65.0 in | Wt 138.2 lb

## 2012-08-27 DIAGNOSIS — I499 Cardiac arrhythmia, unspecified: Secondary | ICD-10-CM

## 2012-08-27 DIAGNOSIS — E785 Hyperlipidemia, unspecified: Secondary | ICD-10-CM

## 2012-08-27 DIAGNOSIS — R002 Palpitations: Secondary | ICD-10-CM

## 2012-08-27 DIAGNOSIS — R000 Tachycardia, unspecified: Secondary | ICD-10-CM

## 2012-08-27 DIAGNOSIS — K219 Gastro-esophageal reflux disease without esophagitis: Secondary | ICD-10-CM

## 2012-08-27 DIAGNOSIS — R9431 Abnormal electrocardiogram [ECG] [EKG]: Secondary | ICD-10-CM

## 2012-08-27 DIAGNOSIS — R0789 Other chest pain: Secondary | ICD-10-CM

## 2012-08-27 NOTE — Assessment & Plan Note (Signed)
No arrhythmia seen on today's visit or on recent EKG with Dr. Milinda Antis. She does report irregular pulse at times also tachycardia with palpitations this morning lasting 10-15 minutes. We have offered her a 48-hour Holter monitor. 1 is not available in the office today to be placed and we will call her this week when one arrives in the clinic for [place] to place. She is interested in ruling out other arrhythmia such as atrial fibrillation for example. I suspect her rhythm may be either atrial tachycardia or ectopy. A should not prevent her from proceeding with EGD if needed. She is otherwise relatively asymptomatic and symptoms are typically very brief and rare.

## 2012-08-27 NOTE — Progress Notes (Signed)
Patient ID: Sarah Harper, female    DOB: 1940/10/09, 72 y.o.   MRN: [id number]  HPI Comments: Sarah Harper is a very pleasant 72 year old woman who presents by referral from Dr. Ermalene Searing for evaluation of palpitations. The patient is relatively healthy but does have a history of GERD. She sees Dr. Kinnie Scales and on recent evaluation by the GI clinic was found to have palpitations/irregular heartbeat. She was initially scheduled for EGD and this was delayed for further workup.  She does report that she continues to have periods of irregular heartbeats. This morning for example, she had 10-15 minutes of tachycardia/palpitations. She reports her heart rhythm was faster than normal, measured in the 90s while in a sitting position. She denied any stress or anxiety or anything that could have provoked the rhythm. The rhythm resolved on its own without intervention. She feels the rhythm every day. Typically does not last for a long period of time. Sometimes she does not appreciate the regular rhythm, only when she feels her pulse. She also describes a sensation like something is "passing to her head". Otherwise she has been feeling well with no problems. GERD has been getting worse recently.  EKG shows normal sinus rhythm with rate 71 beats per minute with no significant ST or T wave changes      Outpatient Encounter Prescriptions as of 08/27/2012  Medication Sig Dispense Refill  . Butalbital-APAP-Caffeine (FIORICET PO) Take by mouth.      . Calcium Carbonate 800 MG/2GM POWD Take 2 capsules by mouth daily.       . Cholecalciferol (VITAMIN D) 1000 UNITS capsule Take 1,000 Units by mouth daily.        Marland Kitchen conjugated estrogens (PREMARIN) vaginal cream Apply small amount vaginally 2-3 times a week.  42.5 g  3  . cyanocobalamin 1000 MCG tablet Take one tablet three times a week      . dexlansoprazole (DEXILANT) 60 MG capsule Take 1 capsule (60 mg total) by mouth daily.  90 capsule  0  . fluticasone (FLONASE) 50  MCG/ACT nasal spray 2 sprays by Nasal route daily.        Marland Kitchen levothyroxine (SYNTHROID, LEVOTHROID) 88 MCG tablet Take 1 tablet (88 mcg total) by mouth daily.  90 tablet  3  . lidocaine (XYLOCAINE) 5 % ointment Apply 1 application topically as needed.  50 g  3  . methylcellulose (CITRUCEL) oral powder As directed as needed       . Probiotic Product (ALIGN) 4 MG CAPS Take one capsule by mouth daily for one week out of every month         Review of Systems  Constitutional: Negative.   HENT: Negative.   Eyes: Negative.   Respiratory: Negative.   Cardiovascular: Positive for palpitations.       Tachycardia  Gastrointestinal: Negative.   Musculoskeletal: Negative.   Skin: Negative.   Neurological: Negative.   Hematological: Negative.   Psychiatric/Behavioral: Negative.   All other systems reviewed and are negative.    BP 120/84  Pulse 71  Ht 5\' 5"  (1.651 m)  Wt 138 lb 4 oz (62.71 kg)  BMI 23.01 kg/m2  Physical Exam  Nursing note and vitals reviewed. Constitutional: She is oriented to person, place, and time. She appears well-developed and well-nourished.  HENT:  Head: Normocephalic.  Nose: Nose normal.  Mouth/Throat: Oropharynx is clear and moist.  Eyes: Conjunctivae normal are normal. Pupils are equal, round, and reactive to light.  Neck: Normal range of motion.  Neck supple. No JVD present.  Cardiovascular: Normal rate, regular rhythm, S1 normal, S2 normal, normal heart sounds and intact distal pulses.  Exam reveals no gallop and no friction rub.   No murmur heard. Pulmonary/Chest: Effort normal and breath sounds normal. No respiratory distress. She has no wheezes. She has no rales. She exhibits no tenderness.  Abdominal: Soft. Bowel sounds are normal. She exhibits no distension. There is no tenderness.  Musculoskeletal: Normal range of motion. She exhibits no edema and no tenderness.  Lymphadenopathy:    She has no cervical adenopathy.  Neurological: She is alert and oriented  to person, place, and time. Coordination normal.  Skin: Skin is warm and dry. No rash noted. No erythema.  Psychiatric: She has a normal mood and affect. Her behavior is normal. Judgment and thought content normal.         Assessment and Plan

## 2012-08-27 NOTE — Assessment & Plan Note (Signed)
We did discuss her elevated cholesterol numbers. She will continue to work on her diet, weight and consider taking red yeast rice. She prefers not to start a prescription medication.

## 2012-08-27 NOTE — Assessment & Plan Note (Signed)
She has followup with Dr. Kinnie Scales. She would be acceptable risk for EGD.

## 2012-08-27 NOTE — Patient Instructions (Addendum)
You are doing well. No medication changes were made.  We will order a 48 hour monitor for you for palpitations and tachycardia  Consider trying Red Yeast Rice for cholesterol  Please call us if you have new issues that need to be addressed before your next appt.

## 2012-08-31 ENCOUNTER — Encounter: Payer: Self-pay | Admitting: Family Medicine

## 2012-09-06 ENCOUNTER — Ambulatory Visit (INDEPENDENT_AMBULATORY_CARE_PROVIDER_SITE_OTHER): Payer: Medicare Other

## 2012-09-06 DIAGNOSIS — R42 Dizziness and giddiness: Secondary | ICD-10-CM

## 2012-09-06 DIAGNOSIS — R002 Palpitations: Secondary | ICD-10-CM

## 2012-09-06 DIAGNOSIS — R000 Tachycardia, unspecified: Secondary | ICD-10-CM

## 2012-09-06 NOTE — Progress Notes (Signed)
Placed 48 hour holter monitor 09/06/2012 to 09/08/2012. Serial number # [id number].

## 2012-09-20 ENCOUNTER — Telehealth: Payer: Self-pay

## 2012-09-20 NOTE — Telephone Encounter (Signed)
Pt informed Understanding verb 

## 2012-09-20 NOTE — Telephone Encounter (Signed)
Dr. Mariah Milling reviewed HM results Per Dr. Mariah Milling, NSR with frequent APCs, rare PVCs.

## 2012-10-14 ENCOUNTER — Other Ambulatory Visit: Payer: Self-pay

## 2012-10-14 MED ORDER — ESTROGENS, CONJUGATED 0.625 MG/GM VA CREA: TOPICAL_CREAM | VAGINAL | Status: DC

## 2012-10-14 NOTE — Telephone Encounter (Signed)
I will send the vaginal cream  The fioricet -not sure about --most mail order co will not do controlled meds by mail- please check on it with pt (? If did it before) Also ask on average how many fioricet she usually needs per mo, thanks

## 2012-10-14 NOTE — Telephone Encounter (Signed)
Pt left v/m requesting refill Premarin vaginal cream #3 tubes sent to [facility]. Pt's last CPX 12/22/11. Pt also request refill Fioricet to [facility] but meds & orders would not let me pull up as unsigned order.Please advise.

## 2012-10-14 NOTE — Telephone Encounter (Signed)
Pt said that her mail order pharmacy does deliver her fioricet, the last time you prescribed it was through the mail order pharmacy pt says she usually takes about 1 a day so would just need #90 for the 3 month supply and please put a note to give her the generic because it's cheaper

## 2012-10-15 MED ORDER — BUTALBITAL-APAP-CAFFEINE 50-325-40 MG PO TABS: 1.0000 | ORAL_TABLET | Freq: Four times a day (QID) | ORAL | Status: DC | PRN

## 2012-10-15 NOTE — Telephone Encounter (Signed)
Rx faxed

## 2012-10-15 NOTE — Telephone Encounter (Signed)
Ok- printed to fax, thanks

## 2012-10-29 ENCOUNTER — Other Ambulatory Visit: Payer: Self-pay | Admitting: Family Medicine

## 2012-11-01 ENCOUNTER — Other Ambulatory Visit: Payer: Self-pay | Admitting: Family Medicine

## 2012-11-08 ENCOUNTER — Ambulatory Visit: Payer: Medicare Other

## 2012-11-12 ENCOUNTER — Ambulatory Visit (INDEPENDENT_AMBULATORY_CARE_PROVIDER_SITE_OTHER): Payer: Medicare Other | Admitting: *Deleted

## 2012-11-12 DIAGNOSIS — Z23 Encounter for immunization: Secondary | ICD-10-CM

## 2012-11-12 DIAGNOSIS — Z2911 Encounter for prophylactic immunotherapy for respiratory syncytial virus (RSV): Secondary | ICD-10-CM

## 2013-02-06 ENCOUNTER — Telehealth: Payer: Self-pay | Admitting: Family Medicine

## 2013-02-06 DIAGNOSIS — E039 Hypothyroidism, unspecified: Secondary | ICD-10-CM

## 2013-02-06 DIAGNOSIS — K219 Gastro-esophageal reflux disease without esophagitis: Secondary | ICD-10-CM

## 2013-02-06 DIAGNOSIS — E785 Hyperlipidemia, unspecified: Secondary | ICD-10-CM

## 2013-02-06 DIAGNOSIS — Z0000 Encounter for general adult medical examination without abnormal findings: Secondary | ICD-10-CM | POA: Insufficient documentation

## 2013-02-06 DIAGNOSIS — Z13220 Encounter for screening for lipoid disorders: Secondary | ICD-10-CM

## 2013-02-06 DIAGNOSIS — M899 Disorder of bone, unspecified: Secondary | ICD-10-CM

## 2013-02-06 NOTE — Telephone Encounter (Signed)
Message copied by Judy Pimple on Thu Feb 06, 2013  3:44 PM ------      Message from: Baldomero Lamy      Created: Fri Jan 31, 2013  8:58 AM      Regarding: Cpx Labs Wed 6/25       Please order  future cpx labs for pt's upcoming lab appt.      Thanks      Tasha       ------

## 2013-02-11 ENCOUNTER — Other Ambulatory Visit: Payer: Medicare Other

## 2013-02-12 ENCOUNTER — Other Ambulatory Visit (INDEPENDENT_AMBULATORY_CARE_PROVIDER_SITE_OTHER): Payer: Medicare Other

## 2013-02-12 DIAGNOSIS — M899 Disorder of bone, unspecified: Secondary | ICD-10-CM

## 2013-02-12 DIAGNOSIS — E785 Hyperlipidemia, unspecified: Secondary | ICD-10-CM

## 2013-02-12 DIAGNOSIS — K219 Gastro-esophageal reflux disease without esophagitis: Secondary | ICD-10-CM

## 2013-02-12 DIAGNOSIS — E039 Hypothyroidism, unspecified: Secondary | ICD-10-CM

## 2013-02-12 LAB — COMPREHENSIVE METABOLIC PANEL
ALT: 21 U/L (ref 0–35)
AST: 27 U/L (ref 0–37)
Albumin: 4.2 g/dL (ref 3.5–5.2)
Alkaline Phosphatase: 59 U/L (ref 39–117)
BUN: 14 mg/dL (ref 6–23)
CO2: 28 mEq/L (ref 19–32)
Calcium: 9.6 mg/dL (ref 8.4–10.5)
Chloride: 102 mEq/L (ref 96–112)
Creatinine, Ser: 0.7 mg/dL (ref 0.4–1.2)
GFR: 82.07 mL/min (ref >60.00)
Glucose, Bld: 97 mg/dL (ref 70–99)
Potassium: 4.3 mEq/L (ref 3.5–5.1)
Sodium: 138 mEq/L (ref 135–145)
Total Bilirubin: 0.8 mg/dL (ref 0.3–1.2)
Total Protein: 7.2 g/dL (ref 6.0–8.3)

## 2013-02-12 LAB — CBC WITH DIFFERENTIAL/PLATELET
Basophils Absolute: 0 10*3/uL (ref 0.0–0.1)
Basophils Relative: 0.6 % (ref 0.0–3.0)
Eosinophils Absolute: 0.1 10*3/uL (ref 0.0–0.7)
Eosinophils Relative: 2.8 % (ref 0.0–5.0)
HCT: 41.4 % (ref 36.0–46.0)
Hemoglobin: 13.6 g/dL (ref 12.0–15.0)
Lymphocytes Relative: 27.4 % (ref 12.0–46.0)
Lymphs Abs: 1.4 10*3/uL (ref 0.7–4.0)
MCHC: 33 g/dL (ref 30.0–36.0)
MCV: 92.2 fl (ref 78.0–100.0)
Monocytes Absolute: 0.4 10*3/uL (ref 0.1–1.0)
Monocytes Relative: 7.7 % (ref 3.0–12.0)
Neutro Abs: 3.1 10*3/uL (ref 1.4–7.7)
Neutrophils Relative %: 61.5 % (ref 43.0–77.0)
Platelets: 252 10*3/uL (ref 150.0–400.0)
RBC: 4.49 Mil/uL (ref 3.87–5.11)
RDW: 14.8 % — ABNORMAL HIGH (ref 11.5–14.6)
WBC: 5.1 10*3/uL (ref 4.5–10.5)

## 2013-02-12 LAB — LIPID PANEL
Cholesterol: 222 mg/dL — ABNORMAL HIGH (ref 0–200)
HDL: 89 mg/dL (ref >39.00)
Total CHOL/HDL Ratio: 2
Triglycerides: 77 mg/dL (ref 0.0–149.0)
VLDL: 15.4 mg/dL (ref 0.0–40.0)

## 2013-02-12 LAB — LDL CHOLESTEROL, DIRECT: Direct LDL: 115.5 mg/dL

## 2013-02-12 LAB — TSH: TSH: 3.17 u[IU]/mL (ref 0.35–5.50)

## 2013-02-13 LAB — VITAMIN D 25 HYDROXY (VIT D DEFICIENCY, FRACTURES): Vit D, 25-Hydroxy: 59 ng/mL (ref 30–89)

## 2013-02-18 ENCOUNTER — Ambulatory Visit (INDEPENDENT_AMBULATORY_CARE_PROVIDER_SITE_OTHER): Payer: Medicare Other | Admitting: Family Medicine

## 2013-02-18 ENCOUNTER — Encounter: Payer: Self-pay | Admitting: Family Medicine

## 2013-02-18 VITALS — BP 112/62 | HR 77 | Temp 98.4°F | Ht 65.25 in | Wt 138.2 lb

## 2013-02-18 DIAGNOSIS — M899 Disorder of bone, unspecified: Secondary | ICD-10-CM

## 2013-02-18 DIAGNOSIS — E039 Hypothyroidism, unspecified: Secondary | ICD-10-CM

## 2013-02-18 DIAGNOSIS — Z0000 Encounter for general adult medical examination without abnormal findings: Secondary | ICD-10-CM

## 2013-02-18 DIAGNOSIS — K2270 Barrett's esophagus without dysplasia: Secondary | ICD-10-CM

## 2013-02-18 DIAGNOSIS — E785 Hyperlipidemia, unspecified: Secondary | ICD-10-CM

## 2013-02-18 DIAGNOSIS — M949 Disorder of cartilage, unspecified: Secondary | ICD-10-CM

## 2013-02-18 MED ORDER — LEVOTHYROXINE SODIUM 88 MCG PO TABS: 88.0000 ug | ORAL_TABLET | Freq: Every day | ORAL | Status: DC

## 2013-02-18 MED ORDER — DEXLANSOPRAZOLE 60 MG PO CPDR: 60.0000 mg | DELAYED_RELEASE_CAPSULE | Freq: Every day | ORAL | Status: DC

## 2013-02-18 NOTE — Assessment & Plan Note (Signed)
dexa is utd/ mild osteopenia and no fx  D level is ok  Will continue to monitor dexa due in another year

## 2013-02-18 NOTE — Patient Instructions (Addendum)
Take care of yourself  Do not forget to make your annual mammogram appointment - 3D is a good idea if you can afford  Stay active

## 2013-02-18 NOTE — Progress Notes (Signed)
Subjective:    Patient ID: Sarah Harper, female    DOB: 1941/01/19, 72 y.o.   MRN: [id number]  HPI I have personally reviewed the Medicare Annual Wellness questionnaire and have noted 1. The patient's medical and social history 2. Their use of alcohol, tobacco or illicit drugs 3. Their current medications and supplements 4. The patient's functional ability including ADL's, fall risks, home safety risks and hearing or visual             impairment. 5. Diet and physical activities 6. Evidence for depression or mood disorders  The patients weight, height, BMI have been recorded in the chart and visual acuity is per eye clinic.  I have made referrals, counseling and provided education to the patient based review of the above and I have provided the pt with a written personalized care plan for preventive services.  See scanned forms.  Routine anticipatory guidance given to patient.  See health maintenance. Flu 10/13 Shingles 3/14 vaccine  PNA 5/13 vaccine  Tetanus 5/13 vaccine  Colon 3/12 - 10 year recall if needed  Breast cancer screening 7/13 - breast center  Self exam no lumps  Advance directive-- has a living will set up  Cognitive function addressed- see scanned forms- and if abnormal then additional documentation follows.  No worries about her memory   Falls-none and no fractures   Mood - has been good- she stays motivated and on the go  Her back problems limit her a bit   PMH and SH reviewed  Meds, vitals, and allergies reviewed.   ROS: See HPI.  Otherwise negative.    Hypothyroid Hypothyroidism  Pt has no clinical changes No change in energy level/ hair or skin/ edema and no tremor Lab Results  Component Value Date   TSH 3.17 02/12/2013     Hyperlipidemia  Lab Results  Component Value Date   CHOL 222* 02/12/2013   CHOL 221* 12/19/2010   CHOL 223* 12/17/2009   Lab Results  Component Value Date   HDL 89.00 02/12/2013   HDL 40.98 12/19/2010   HDL 11.91  12/17/2009   No results found for this basename: [facility]   Lab Results  Component Value Date   TRIG 77.0 02/12/2013   TRIG 68.0 12/19/2010   TRIG 91.0 12/17/2009   Lab Results  Component Value Date   CHOLHDL 2 02/12/2013   CHOLHDL 2 12/19/2010   CHOLHDL 3 12/17/2009   Lab Results  Component Value Date   LDLDIRECT 115.5 02/12/2013   LDLDIRECT 109.9 12/19/2010   LDLDIRECT 117.7 12/17/2009    Osteopenia Mild 7/13 dexa D level normal in 50s   More problems with urination as she gets older- some urge incontinence issues  Patient Active Problem List   Diagnosis Date Noted  . Encounter for Medicare annual wellness exam 02/06/2013  . Hyperlipidemia 08/27/2012  . Irregular heart beat 08/05/2012  . Gynecological examination 12/20/2010  . Screening for lipoid disorders 12/08/2010  . Eustachian tube dysfunction 11/29/2010  . BARRETTS ESOPHAGUS 10/01/2010  . OSTEOPENIA 12/17/2009  . BACK PAIN, LUMBAR 07/02/2009  . HEADACHE 04/14/2009  . IRRITABLE BOWEL SYNDROME 04/02/2009  . FATIGUE 04/02/2009  . POSTMENOPAUSAL STATUS 12/10/2008  . HYPOTHYROIDISM 12/05/2007  . MENIERE'S DISEASE 12/05/2007  . GERD 12/05/2007   Past Medical History  Diagnosis Date  . GERD (gastroesophageal reflux disease)   . Thyroid disease     hypothyroid  . Depression   . IBS (irritable bowel syndrome)   . ETD (eustachian tube dysfunction)   .  Meniere's disease   . Migraine   . Osteoporosis   . DDD (degenerative disc disease)     chronic back pain   Past Surgical History  Procedure Laterality Date  . Lumbar disc surgery      L5   History  Substance Use Topics  . Smoking status: Never Smoker   . Smokeless tobacco: Not on file  . Alcohol Use: 0.6 oz/week    1 Glasses of wine per week     Comment: wine occasional   Family History  Problem Relation Age of Onset  . Diabetes Paternal Aunt   . Hypertension Maternal Grandmother   . Hypertension Maternal Grandfather    Allergies  Allergen Reactions   . Epinephrine    Current Outpatient Prescriptions on File Prior to Visit  Medication Sig Dispense Refill  . butalbital-acetaminophen-caffeine (FIORICET) 50-325-40 MG per tablet Take 1 tablet by mouth every 6 (six) hours as needed.  90 tablet  3  . Calcium Carbonate 800 MG/2GM POWD Take 2 capsules by mouth daily.       . Cholecalciferol (VITAMIN D) 1000 UNITS capsule Take 1,000 Units by mouth daily.        Marland Kitchen conjugated estrogens (PREMARIN) vaginal cream Apply small amount vaginally 2-3 times a week.  90 g  3  . cyanocobalamin 1000 MCG tablet Take one tablet three times a week      . DEXILANT 60 MG capsule TAKE 1 CAPSULE DAILY  90 capsule  1  . fluticasone (FLONASE) 50 MCG/ACT nasal spray 2 sprays by Nasal route daily.        Marland Kitchen levothyroxine (SYNTHROID, LEVOTHROID) 88 MCG tablet TAKE 1 TABLET DAILY  90 tablet  1  . lidocaine (XYLOCAINE) 5 % ointment Apply 1 application topically as needed.  50 g  3   No current facility-administered medications on file prior to visit.    Review of Systems Review of Systems  Constitutional: Negative for fever, appetite change, fatigue and unexpected weight change.  Eyes: Negative for pain and visual disturbance.  Respiratory: Negative for cough and shortness of breath.   Cardiovascular: Negative for cp or palpitations    Gastrointestinal: Negative for nausea, diarrhea and constipation.  Genitourinary: pos for urgency and frequency. neg for dysuria or blood in urine Skin: Negative for pallor or rash   Neurological: Negative for weakness, light-headedness, numbness and headaches.  Hematological: Negative for adenopathy. Does not bruise/bleed easily.  Psychiatric/Behavioral: Negative for dysphoric mood. The patient is not nervous/anxious.         Objective:   Physical Exam  Constitutional: She appears well-developed and well-nourished. No distress.  HENT:  Head: Normocephalic and atraumatic.  Right Ear: External ear normal.  Left Ear: External ear  normal.  Nose: Nose normal.  Mouth/Throat: Oropharynx is clear and moist.  Eyes: Conjunctivae and EOM are normal. Pupils are equal, round, and reactive to light. Right eye exhibits no discharge. Left eye exhibits no discharge. No scleral icterus.  Neck: Normal range of motion. Neck supple. No JVD present. Carotid bruit is not present. No thyromegaly present.  Cardiovascular: Normal rate, regular rhythm, normal heart sounds and intact distal pulses.  Exam reveals no gallop.   Pulmonary/Chest: Effort normal and breath sounds normal. No respiratory distress. She has no wheezes. She exhibits no tenderness.  Abdominal: Soft. Bowel sounds are normal. She exhibits no distension, no abdominal bruit and no mass. There is no tenderness.  Genitourinary: No breast swelling, tenderness, discharge or bleeding.  Breast exam: No mass,  nodules, thickening, tenderness, bulging, retraction, inflamation, nipple discharge or skin changes noted.  No axillary or clavicular LA.  Chaperoned exam.    Musculoskeletal: She exhibits no edema and no tenderness.  Lymphadenopathy:    She has no cervical adenopathy.  Neurological: She is alert. She has normal reflexes. No cranial nerve deficit. She exhibits normal muscle tone. Coordination normal.  Skin: Skin is warm and dry. No rash noted. No erythema. No pallor.  Psychiatric: She has a normal mood and affect.          Assessment & Plan:

## 2013-02-18 NOTE — Assessment & Plan Note (Signed)
Reviewed health habits including diet and exercise and skin cancer prevention Also reviewed health mt list, fam hx and immunizations  Labs rev See HPI 

## 2013-02-18 NOTE — Assessment & Plan Note (Signed)
Good ratio this check with high HDL Disc goals for lipids and reasons to control them Rev labs with pt Rev low sat fat diet in detail

## 2013-02-18 NOTE — Assessment & Plan Note (Signed)
Refilled dexilant that works well utd on EGD

## 2013-02-18 NOTE — Assessment & Plan Note (Signed)
tsh is theraputic No clinical changes  Med refilled

## 2013-05-20 ENCOUNTER — Encounter: Payer: Self-pay | Admitting: Family Medicine

## 2013-06-11 ENCOUNTER — Ambulatory Visit (INDEPENDENT_AMBULATORY_CARE_PROVIDER_SITE_OTHER): Payer: Medicare Other

## 2013-06-11 DIAGNOSIS — Z23 Encounter for immunization: Secondary | ICD-10-CM

## 2013-06-12 ENCOUNTER — Ambulatory Visit: Payer: Medicare Other

## 2013-09-12 ENCOUNTER — Other Ambulatory Visit: Payer: Self-pay

## 2013-09-12 DIAGNOSIS — Z1231 Encounter for screening mammogram for malignant neoplasm of breast: Secondary | ICD-10-CM

## 2013-10-27 ENCOUNTER — Ambulatory Visit: Payer: Medicare Other

## 2013-10-29 ENCOUNTER — Ambulatory Visit
Admission: RE | Admit: 2013-10-29 | Discharge: 2013-10-29 | Disposition: A | Discharge status: Home / Self Care | Payer: Medicare Other | Source: Ambulatory Visit

## 2013-10-29 DIAGNOSIS — Z1231 Encounter for screening mammogram for malignant neoplasm of breast: Secondary | ICD-10-CM

## 2014-02-07 ENCOUNTER — Telehealth: Payer: Self-pay | Admitting: Family Medicine

## 2014-02-07 DIAGNOSIS — M899 Disorder of bone, unspecified: Secondary | ICD-10-CM

## 2014-02-07 DIAGNOSIS — M949 Disorder of cartilage, unspecified: Secondary | ICD-10-CM

## 2014-02-07 DIAGNOSIS — K2100 Gastro-esophageal reflux disease with esophagitis, without bleeding: Secondary | ICD-10-CM

## 2014-02-07 DIAGNOSIS — Z13220 Encounter for screening for lipoid disorders: Secondary | ICD-10-CM

## 2014-02-07 DIAGNOSIS — E785 Hyperlipidemia, unspecified: Secondary | ICD-10-CM

## 2014-02-07 DIAGNOSIS — E039 Hypothyroidism, unspecified: Secondary | ICD-10-CM

## 2014-02-07 NOTE — Telephone Encounter (Signed)
Message copied by Abner Greenspan on Sat Feb 07, 2014  4:46 PM ------      Message from: Ellamae Sia      Created: Tue Feb 03, 2014  4:37 PM      Regarding: lab orders       Patient is scheduled for CPX labs, please order future labs, Thanks , Sarah Harper       ------

## 2014-02-17 ENCOUNTER — Other Ambulatory Visit (INDEPENDENT_AMBULATORY_CARE_PROVIDER_SITE_OTHER): Payer: Medicare Other

## 2014-02-17 DIAGNOSIS — E039 Hypothyroidism, unspecified: Secondary | ICD-10-CM

## 2014-02-17 DIAGNOSIS — K2100 Gastro-esophageal reflux disease with esophagitis, without bleeding: Secondary | ICD-10-CM

## 2014-02-17 DIAGNOSIS — E785 Hyperlipidemia, unspecified: Secondary | ICD-10-CM

## 2014-02-17 DIAGNOSIS — R5381 Other malaise: Secondary | ICD-10-CM

## 2014-02-17 DIAGNOSIS — R5383 Other fatigue: Secondary | ICD-10-CM

## 2014-02-17 DIAGNOSIS — M899 Disorder of bone, unspecified: Secondary | ICD-10-CM

## 2014-02-17 DIAGNOSIS — Z13220 Encounter for screening for lipoid disorders: Secondary | ICD-10-CM

## 2014-02-17 DIAGNOSIS — M949 Disorder of cartilage, unspecified: Secondary | ICD-10-CM

## 2014-02-17 LAB — COMPREHENSIVE METABOLIC PANEL
ALT: 25 U/L (ref 0–35)
AST: 21 U/L (ref 0–37)
Albumin: 4.5 g/dL (ref 3.5–5.2)
Alkaline Phosphatase: 67 U/L (ref 39–117)
BUN: 18 mg/dL (ref 6–23)
CO2: 30 mEq/L (ref 19–32)
Calcium: 9.7 mg/dL (ref 8.4–10.5)
Chloride: 95 mEq/L — ABNORMAL LOW (ref 96–112)
Creatinine, Ser: 0.8 mg/dL (ref 0.4–1.2)
GFR: 77.01 mL/min (ref >60.00)
Glucose, Bld: 92 mg/dL (ref 70–99)
Potassium: 3.5 mEq/L (ref 3.5–5.1)
Sodium: 135 mEq/L (ref 135–145)
Total Bilirubin: 1 mg/dL (ref 0.2–1.2)
Total Protein: 7.5 g/dL (ref 6.0–8.3)

## 2014-02-17 LAB — LIPID PANEL
Cholesterol: 221 mg/dL — ABNORMAL HIGH (ref 0–200)
HDL: 103.8 mg/dL (ref >39.00)
LDL Cholesterol: 99 mg/dL (ref 0–99)
NonHDL: 117.2
Total CHOL/HDL Ratio: 2
Triglycerides: 93 mg/dL (ref 0.0–149.0)
VLDL: 18.6 mg/dL (ref 0.0–40.0)

## 2014-02-17 LAB — CBC WITH DIFFERENTIAL/PLATELET
Basophils Absolute: 0 10*3/uL (ref 0.0–0.1)
Basophils Relative: 0.3 % (ref 0.0–3.0)
Eosinophils Absolute: 0.1 10*3/uL (ref 0.0–0.7)
Eosinophils Relative: 0.8 % (ref 0.0–5.0)
HCT: 44.3 % (ref 36.0–46.0)
Hemoglobin: 14.6 g/dL (ref 12.0–15.0)
Lymphocytes Relative: 39.4 % (ref 12.0–46.0)
Lymphs Abs: 4 10*3/uL (ref 0.7–4.0)
MCHC: 32.9 g/dL (ref 30.0–36.0)
MCV: 90.6 fl (ref 78.0–100.0)
Monocytes Absolute: 0.8 10*3/uL (ref 0.1–1.0)
Monocytes Relative: 8.4 % (ref 3.0–12.0)
Neutro Abs: 5.2 10*3/uL (ref 1.4–7.7)
Neutrophils Relative %: 51.1 % (ref 43.0–77.0)
Platelets: 301 10*3/uL (ref 150.0–400.0)
RBC: 4.89 Mil/uL (ref 3.87–5.11)
RDW: 14.2 % (ref 11.5–15.5)
WBC: 10.1 10*3/uL (ref 4.0–10.5)

## 2014-02-17 LAB — TSH: TSH: 0.74 u[IU]/mL (ref 0.35–4.50)

## 2014-02-17 LAB — VITAMIN D 25 HYDROXY (VIT D DEFICIENCY, FRACTURES): VITD: 49.74 ng/mL

## 2014-02-24 ENCOUNTER — Encounter: Payer: Self-pay | Admitting: Family Medicine

## 2014-02-24 ENCOUNTER — Ambulatory Visit (INDEPENDENT_AMBULATORY_CARE_PROVIDER_SITE_OTHER): Payer: Medicare Other | Admitting: Family Medicine

## 2014-02-24 VITALS — BP 136/78 | HR 77 | Temp 98.5°F | Ht 65.5 in | Wt 132.8 lb

## 2014-02-24 DIAGNOSIS — E785 Hyperlipidemia, unspecified: Secondary | ICD-10-CM

## 2014-02-24 DIAGNOSIS — Z0000 Encounter for general adult medical examination without abnormal findings: Secondary | ICD-10-CM

## 2014-02-24 DIAGNOSIS — Z23 Encounter for immunization: Secondary | ICD-10-CM

## 2014-02-24 DIAGNOSIS — M949 Disorder of cartilage, unspecified: Secondary | ICD-10-CM

## 2014-02-24 DIAGNOSIS — E039 Hypothyroidism, unspecified: Secondary | ICD-10-CM

## 2014-02-24 DIAGNOSIS — M899 Disorder of bone, unspecified: Secondary | ICD-10-CM

## 2014-02-24 MED ORDER — DEXLANSOPRAZOLE 60 MG PO CPDR: 60.0000 mg | DELAYED_RELEASE_CAPSULE | Freq: Every day | ORAL | Status: DC

## 2014-02-24 MED ORDER — LEVOTHYROXINE SODIUM 88 MCG PO TABS: 88.0000 ug | ORAL_TABLET | Freq: Every day | ORAL | Status: DC

## 2014-02-24 NOTE — Progress Notes (Signed)
Subjective:    Patient ID: Sarah Harper, female    DOB: 01-Sep-1940, 73 y.o.   MRN: [id number]  HPI I have personally reviewed the Medicare Annual Wellness questionnaire and have noted 1. The patient's medical and social history 2. Their use of alcohol, tobacco or illicit drugs 3. Their current medications and supplements 4. The patient's functional ability including ADL's, fall risks, home safety risks and hearing or visual             impairment. 5. Diet and physical activities 6. Evidence for depression or mood disorders  The patients weight, height, BMI have been recorded in the chart and visual acuity is per eye clinic.  I have made referrals, counseling and provided education to the patient based review of the above and I have provided the pt with a written personalized care plan for preventive services.  Doing ok overall  tx ear problem with Dr Richardson Landry- Meniere's  Also having dental implants - issue with a bone graft - developed an infection  Is on clindamycin    See scanned forms.  Routine anticipatory guidance given to patient.  See health maintenance. Colon cancer screening - 2013 with Dr Allyn Kenner (she thinks)--10 year recall  Breast cancer screening 3/15 nl - did the 3D type Self breast exam- no lumps or changes  Nl pap 5/12 - no problems or symptoms  Flu vaccine 10/14 Tetanus vaccine 5/13 Pneumovax 5/13 ,  Will get prevnar today Zoster vaccine 3/14  Advance directive- has a living will and POA Cognitive function addressed- see scanned forms- and if abnormal then additional documentation follows. No particular worries about memory   PMH and SH reviewed  Meds, vitals, and allergies reviewed.   ROS: See HPI.  Otherwise negative.     Hyperlipidemia hx  Lab Results  Component Value Date   CHOL 221* 02/17/2014   CHOL 222* 02/12/2013   CHOL 221* 12/19/2010   Lab Results  Component Value Date   HDL 103.80 02/17/2014   HDL 89.00 02/12/2013   HDL 89.70 12/19/2010    Lab Results  Component Value Date   LDLCALC 99 02/17/2014   Lab Results  Component Value Date   TRIG 93.0 02/17/2014   TRIG 77.0 02/12/2013   TRIG 68.0 12/19/2010   Lab Results  Component Value Date   CHOLHDL 2 02/17/2014   CHOLHDL 2 02/12/2013   CHOLHDL 2 12/19/2010   Lab Results  Component Value Date   LDLDIRECT 115.5 02/12/2013   LDLDIRECT 109.9 12/19/2010   LDLDIRECT 117.7 12/17/2009    This improved- great HDL- using flax seed Less walking due to her back problem   Hypothyroid Hypothyroidism  Pt has no clinical changes No change in energy level/ hair or skin/ edema and no tremor Lab Results  Component Value Date   TSH 0.74 02/17/2014     Osteopenia dexa 7/13   Will wait a while until next one - a lot going on  D level is 49  She is trying biotin for hair   Patient Active Problem List   Diagnosis Date Noted  . Encounter for Medicare annual wellness exam 02/06/2013  . Hyperlipidemia 08/27/2012  . Irregular heart beat 08/05/2012  . Gynecological examination 12/20/2010  . Screening for lipoid disorders 12/08/2010  . Eustachian tube dysfunction 11/29/2010  . BARRETTS ESOPHAGUS 10/01/2010  . OSTEOPENIA 12/17/2009  . BACK PAIN, LUMBAR 07/02/2009  . IRRITABLE BOWEL SYNDROME 04/02/2009  . POSTMENOPAUSAL STATUS 12/10/2008  . HYPOTHYROIDISM 12/05/2007  . MENIERE'S  DISEASE 12/05/2007  . GERD 12/05/2007   Past Medical History  Diagnosis Date  . GERD (gastroesophageal reflux disease)   . Thyroid disease     hypothyroid  . Depression   . IBS (irritable bowel syndrome)   . ETD (eustachian tube dysfunction)   . Meniere's disease   . Migraine   . Osteoporosis   . DDD (degenerative disc disease)     chronic back pain   Past Surgical History  Procedure Laterality Date  . Lumbar disc surgery      L5   History  Substance Use Topics  . Smoking status: Never Smoker   . Smokeless tobacco: Not on file  . Alcohol Use: 0.6 oz/week    1 Glasses of wine per week      Comment: wine occasional   Family History  Problem Relation Age of Onset  . Diabetes Paternal Aunt   . Hypertension Maternal Grandmother   . Hypertension Maternal Grandfather    Allergies  Allergen Reactions  . Epinephrine    Current Outpatient Prescriptions on File Prior to Visit  Medication Sig Dispense Refill  . aspirin 81 MG tablet Take 81 mg by mouth 3 (three) times a week.      . butalbital-acetaminophen-caffeine (FIORICET) 50-325-40 MG per tablet Take 1 tablet by mouth every 6 (six) hours as needed.  90 tablet  3  . Cholecalciferol (VITAMIN D) 1000 UNITS capsule Take 1,000 Units by mouth daily.        Marland Kitchen conjugated estrogens (PREMARIN) vaginal cream Apply small amount vaginally 2-3 times a week.  90 g  3  . cyanocobalamin 1000 MCG tablet Take one tablet three times a week      . fluticasone (FLONASE) 50 MCG/ACT nasal spray 2 sprays by Nasal route daily.        Marland Kitchen lidocaine (XYLOCAINE) 5 % ointment Apply 1 application topically as needed.  50 g  3  . traMADol (ULTRAM) 50 MG tablet Take 50 mg by mouth as needed for pain.       No current facility-administered medications on file prior to visit.    Review of Systems Review of Systems  Constitutional: Negative for fever, appetite change, fatigue and unexpected weight change.  Eyes: Negative for pain and visual disturbance.  Respiratory: Negative for cough and shortness of breath.   Cardiovascular: Negative for cp or palpitations    Gastrointestinal: Negative for nausea, diarrhea and constipation.  Genitourinary: Negative for urgency and frequency.  Skin: Negative for pallor or rash   Neurological: Negative for weakness, light-headedness, numbness and headaches.  Hematological: Negative for adenopathy. Does not bruise/bleed easily.  Psychiatric/Behavioral: Negative for dysphoric mood. The patient is not nervous/anxious.         Objective:   Physical Exam  Constitutional: She appears well-developed and well-nourished. No  distress.  HENT:  Head: Normocephalic and atraumatic.  Right Ear: External ear normal.  Left Ear: External ear normal.  Mouth/Throat: Oropharynx is clear and moist.  Eyes: Conjunctivae and EOM are normal. Pupils are equal, round, and reactive to light. No scleral icterus.  Neck: Normal range of motion. Neck supple. No JVD present. Carotid bruit is not present. No thyromegaly present.  Cardiovascular: Normal rate, regular rhythm, normal heart sounds and intact distal pulses.  Exam reveals no gallop.   Pulmonary/Chest: Effort normal and breath sounds normal. No respiratory distress. She has no wheezes. She exhibits no tenderness.  Abdominal: Soft. Bowel sounds are normal. She exhibits no distension, no abdominal bruit and  no mass. There is no tenderness.  Genitourinary: No breast swelling, tenderness, discharge or bleeding.  Breast exam: No mass, nodules, thickening, tenderness, bulging, retraction, inflamation, nipple discharge or skin changes noted.  No axillary or clavicular LA.      Musculoskeletal: Normal range of motion. She exhibits no edema and no tenderness.  Lymphadenopathy:    She has no cervical adenopathy.  Neurological: She is alert. She has normal reflexes. No cranial nerve deficit. She exhibits normal muscle tone. Coordination normal.  Skin: Skin is warm and dry. No rash noted. No erythema. No pallor.  Psychiatric: She has a normal mood and affect.          Assessment & Plan:   Problem List Items Addressed This Visit     Endocrine   HYPOTHYROIDISM - Primary      Hypothyroidism  Pt has no clinical changes No change in energy level/ hair or skin/ edema and no tremor Lab Results  Component Value Date   TSH 0.74 02/17/2014    Refilled levothyroxine    Relevant Medications      levothyroxine (SYNTHROID, LEVOTHROID) tablet     Musculoskeletal and Integument   OSTEOPENIA     Due for dexa - pt wants to put it off briefly until her dental work is done  Disc need for  calcium/ vitamin D/ wt bearing exercise and bone density test every 2 y to monitor Disc safety/ fracture risk in detail  D level is good at 49 No falls or fractures       Other   Hyperlipidemia     Disc goals for lipids and reasons to control them Rev labs with pt Rev low sat fat diet in detail Excellent HDL    Encounter for Medicare annual wellness exam     Reviewed health habits including diet and exercise and skin cancer prevention Reviewed appropriate screening tests for age  Also reviewed health mt list, fam hx and immunization status , as well as social and family history   See HPI prevnar vaccine today  Labs reviewed      Other Visit Diagnoses   Need for vaccination with 13-polyvalent pneumococcal conjugate vaccine        Relevant Orders       Pneumococcal conjugate vaccine 13-valent (Completed)

## 2014-02-24 NOTE — Assessment & Plan Note (Signed)
Due for dexa - pt wants to put it off briefly until her dental work is done  Disc need for calcium/ vitamin D/ wt bearing exercise and bone density test every 2 y to monitor Disc safety/ fracture risk in detail  D level is good at 49 No falls or fractures

## 2014-02-24 NOTE — Progress Notes (Signed)
Pre visit review using our clinic review tool, if applicable. No additional management support is needed unless otherwise documented below in the visit note. 

## 2014-02-24 NOTE — Patient Instructions (Signed)
prevnar vaccine today  Take care of yourself  Stay active and eat a healthy diet

## 2014-02-24 NOTE — Assessment & Plan Note (Signed)
Reviewed health habits including diet and exercise and skin cancer prevention Reviewed appropriate screening tests for age  Also reviewed health mt list, fam hx and immunization status , as well as social and family history   See HPI prevnar vaccine today  Labs reviewed

## 2014-02-24 NOTE — Assessment & Plan Note (Signed)
Disc goals for lipids and reasons to control them Rev labs with pt Rev low sat fat diet in detail Excellent HDL

## 2014-02-24 NOTE — Assessment & Plan Note (Signed)
Hypothyroidism  Pt has no clinical changes No change in energy level/ hair or skin/ edema and no tremor Lab Results  Component Value Date   TSH 0.74 02/17/2014    Refilled levothyroxine

## 2014-04-11 ENCOUNTER — Other Ambulatory Visit: Payer: Self-pay | Admitting: Family Medicine

## 2014-04-22 ENCOUNTER — Encounter: Payer: Self-pay | Admitting: Family Medicine

## 2014-04-23 MED ORDER — MELOXICAM 7.5 MG PO TABS: 7.5000 mg | ORAL_TABLET | Freq: Every day | ORAL | Status: DC | PRN

## 2014-04-23 NOTE — Telephone Encounter (Signed)
meloxicam trial

## 2014-05-28 ENCOUNTER — Ambulatory Visit (INDEPENDENT_AMBULATORY_CARE_PROVIDER_SITE_OTHER): Payer: Medicare Other

## 2014-05-28 DIAGNOSIS — Z23 Encounter for immunization: Secondary | ICD-10-CM

## 2014-06-04 ENCOUNTER — Telehealth: Payer: Self-pay | Admitting: Family Medicine

## 2014-06-04 ENCOUNTER — Encounter: Payer: Self-pay | Admitting: Family Medicine

## 2014-06-04 MED ORDER — MELOXICAM 7.5 MG PO TABS: 7.5000 mg | ORAL_TABLET | Freq: Every day | ORAL | Status: DC | PRN

## 2014-06-04 NOTE — Telephone Encounter (Signed)
mobic refill  -pt uses with caution

## 2014-10-07 ENCOUNTER — Encounter: Payer: Self-pay | Admitting: Podiatry

## 2014-10-07 ENCOUNTER — Ambulatory Visit (INDEPENDENT_AMBULATORY_CARE_PROVIDER_SITE_OTHER): Payer: Medicare Other | Admitting: Podiatry

## 2014-10-07 VITALS — BP 114/67 | HR 76 | Resp 12

## 2014-10-07 DIAGNOSIS — L565 Disseminated superficial actinic porokeratosis (DSAP): Secondary | ICD-10-CM | POA: Diagnosis not present

## 2014-10-07 DIAGNOSIS — Q828 Other specified congenital malformations of skin: Secondary | ICD-10-CM

## 2014-10-07 NOTE — Progress Notes (Signed)
   Subjective:    Patient ID: Sarah Harper, female    DOB: May 22, 1941, 74 y.o.   MRN: [id number]  HPI PT STATED LT FOOT BALL OF THE FOOT HAVE CALLUS AND BEEN HURTING FOR 6 MONTHS. THE FOOT IS GETTING WORSE AND GET AGGRAVATED WHEN WALKING. TRIED TO SOAK WITH BUT NO HELP.   Review of Systems  All other systems reviewed and are negative.      Objective:   Physical Exam: I have reviewed her past medical history medications allergies surgery social history and review of systems. Pulses are strongly palpable bilateral. Neurologic sensorium is intact versus once the monofilament. Deep tendon reflexes are intact bilateral and muscle strength +5 over 5 dorsiflexion plantar flexors and inverters everters all just musculature is intact. Orthopedic evaluation of his result was distal to the ankle for range of motion without crepitation. Cutaneous evaluation demonstrates a solitary porokeratosis subsecond metatarsophalangeal joint area left foot.          Assessment & Plan:  Assessment: Porokeratosis left.  Plan: Chemical destruction under occlusion today with salicylic acid after I debrided the area with a blade. She will leave this on for 3 days without getting it wet and take the dressing off and washed thoroughly. She will follow up with me as needed.

## 2015-01-20 ENCOUNTER — Other Ambulatory Visit: Payer: Self-pay | Admitting: Family Medicine

## 2015-01-20 ENCOUNTER — Ambulatory Visit (INDEPENDENT_AMBULATORY_CARE_PROVIDER_SITE_OTHER): Payer: Medicare Other | Admitting: Primary Care

## 2015-01-20 ENCOUNTER — Encounter: Payer: Self-pay | Admitting: Primary Care

## 2015-01-20 ENCOUNTER — Encounter: Payer: Self-pay | Admitting: Family Medicine

## 2015-01-20 VITALS — BP 114/68 | HR 56 | Temp 97.8°F | Ht 65.5 in | Wt 132.8 lb

## 2015-01-20 DIAGNOSIS — R55 Syncope and collapse: Secondary | ICD-10-CM

## 2015-01-20 DIAGNOSIS — R61 Generalized hyperhidrosis: Secondary | ICD-10-CM | POA: Diagnosis not present

## 2015-01-20 LAB — CBC WITH DIFFERENTIAL/PLATELET
Basophils Absolute: 0.1 10*3/uL (ref 0.0–0.1)
Basophils Relative: 0.7 % (ref 0.0–3.0)
Eosinophils Absolute: 0.2 10*3/uL (ref 0.0–0.7)
Eosinophils Relative: 2 % (ref 0.0–5.0)
HCT: 40.8 % (ref 36.0–46.0)
Hemoglobin: 13.4 g/dL (ref 12.0–15.0)
Lymphocytes Relative: 12.9 % (ref 12.0–46.0)
Lymphs Abs: 1.2 10*3/uL (ref 0.7–4.0)
MCHC: 32.9 g/dL (ref 30.0–36.0)
MCV: 88.5 fl (ref 78.0–100.0)
Monocytes Absolute: 0.5 10*3/uL (ref 0.1–1.0)
Monocytes Relative: 5.7 % (ref 3.0–12.0)
Neutro Abs: 7.2 10*3/uL (ref 1.4–7.7)
Neutrophils Relative %: 78.7 % — ABNORMAL HIGH (ref 43.0–77.0)
Platelets: 259 10*3/uL (ref 150.0–400.0)
RBC: 4.61 Mil/uL (ref 3.87–5.11)
RDW: 14.3 % (ref 11.5–15.5)
WBC: 9.1 10*3/uL (ref 4.0–10.5)

## 2015-01-20 LAB — COMPREHENSIVE METABOLIC PANEL
ALT: 17 U/L (ref 0–35)
AST: 22 U/L (ref 0–37)
Albumin: 4.3 g/dL (ref 3.5–5.2)
Alkaline Phosphatase: 76 U/L (ref 39–117)
BUN: 19 mg/dL (ref 6–23)
CO2: 31 mEq/L (ref 19–32)
Calcium: 9.6 mg/dL (ref 8.4–10.5)
Chloride: 100 mEq/L (ref 96–112)
Creatinine, Ser: 0.72 mg/dL (ref 0.40–1.20)
GFR: 84.25 mL/min (ref >60.00)
Glucose, Bld: 82 mg/dL (ref 70–99)
Potassium: 4.3 mEq/L (ref 3.5–5.1)
Sodium: 135 mEq/L (ref 135–145)
Total Bilirubin: 0.4 mg/dL (ref 0.2–1.2)
Total Protein: 7.2 g/dL (ref 6.0–8.3)

## 2015-01-20 LAB — TSH: TSH: 2.31 u[IU]/mL (ref 0.35–4.50)

## 2015-01-20 MED ORDER — B COMPLEX PO TABS: 1.0000 | ORAL_TABLET | Freq: Every day | ORAL | Status: DC

## 2015-01-20 NOTE — Progress Notes (Signed)
Subjective:    Patient ID: Sarah Harper, female    DOB: April 12, 1941, 74 y.o.   MRN: [id number]  HPI  Sarah Harper is a 74 year old female who presents today with a chief complaint of dizziness. She first noticed the dizziness 2 weeks ago with 3 separate incidences which will all occur in the morning about 30-45 min after consuming her morning medications. This morning she reports multiple dizzy spells with diaphoresis and one episode of syncope. She was sitting in her chair after eating breakfast and felt dizzy with diaphoresis and nausea. The next thing she realized she was on the floor with her husband standing over her. She fell out of her chair and hit her head on the breakfast table. She takes Synthroid, Dexilant, and Linzess (three times weekly) before breakfast and had all three this morning. She has never experienced a syncopal episode with her prior dizzy spells. She hardly requires her Tramadol and will take 1/2 tablet at bedtime twice monthly.  Review of Systems  Constitutional: Positive for diaphoresis. Negative for fever and chills.  HENT: Negative for congestion and rhinorrhea.   Respiratory: Negative for cough and shortness of breath.   Cardiovascular: Negative for chest pain.  Gastrointestinal: Negative for abdominal pain.  Genitourinary: Negative for dysuria and frequency.  Skin: Positive for pallor.  Neurological: Positive for dizziness and syncope.       Past Medical History  Diagnosis Date  . GERD (gastroesophageal reflux disease)   . Thyroid disease     hypothyroid  . Depression   . IBS (irritable bowel syndrome)   . ETD (eustachian tube dysfunction)   . Meniere's disease   . Migraine   . Osteoporosis   . DDD (degenerative disc disease)     chronic back pain    History   Social History  . Marital Status: Married    Spouse Name: N/A  . Number of Children: 0  . Years of Education: N/A   Occupational History  . Retired    Social History Main Topics    . Smoking status: Never Smoker   . Smokeless tobacco: Not on file  . Alcohol Use: 0.6 oz/week    1 Glasses of wine per week     Comment: wine occasional  . Drug Use: No  . Sexual Activity: Not on file   Other Topics Concern  . Not on file   Social History Narrative    Past Surgical History  Procedure Laterality Date  . Lumbar disc surgery      L5    Family History  Problem Relation Age of Onset  . Diabetes Paternal Aunt   . Hypertension Maternal Grandmother   . Hypertension Maternal Grandfather     Allergies  Allergen Reactions  . Epinephrine     Current Outpatient Prescriptions on File Prior to Visit  Medication Sig Dispense Refill  . Biotin 5000 MCG CAPS Take 1 capsule by mouth 3 (three) times a week.    . butalbital-acetaminophen-caffeine (FIORICET) 50-325-40 MG per tablet Take 1 tablet by mouth every 6 (six) hours as needed. 90 tablet 3  . Calcium Carb-Cholecalciferol (CALCIUM 600 + D PO) Take 1 tablet by mouth daily.    . Cholecalciferol (VITAMIN D) 1000 UNITS capsule Take 1,000 Units by mouth daily.      . cyanocobalamin 1000 MCG tablet Take 500 mcg by mouth daily. Take one tablet three times a week    . dexlansoprazole (DEXILANT) 60 MG capsule Take 1 capsule (  60 mg total) by mouth daily. 90 capsule 3  . fluticasone (FLONASE) 50 MCG/ACT nasal spray 2 sprays by Nasal route daily.      Marland Kitchen levothyroxine (SYNTHROID, LEVOTHROID) 88 MCG tablet Take 1 tablet (88 mcg total) by mouth daily before breakfast. 90 tablet 3  . lidocaine (XYLOCAINE) 5 % ointment Apply 1 application topically as needed. 50 g 3  . meloxicam (MOBIC) 7.5 MG tablet Take 1 tablet (7.5 mg total) by mouth daily as needed for pain. With a meal 30 tablet 0  . PREMARIN vaginal cream APPLY SMALL AMOUNT VAGINALLY 2 TO 3 TIMES A WEEK 90 g 1  . pseudoephedrine (SUDAFED) 60 MG tablet Take 60 mg by mouth as needed.     . traMADol (ULTRAM) 50 MG tablet Take 50 mg by mouth as needed for pain.     No current  facility-administered medications on file prior to visit.    BP 114/68 mmHg  Pulse 56  Temp(Src) 97.8 F (36.6 C) (Oral)  Ht 5' 5.5" (1.664 m)  Wt 132 lb 12.8 oz (60.238 kg)  BMI 21.76 kg/m2  SpO2 99%    Objective:   Physical Exam  Constitutional: She is oriented to person, place, and time. She appears well-nourished. She does not appear ill.  HENT:  Right Ear: Tympanic membrane and ear canal normal.  Left Ear: Tympanic membrane and ear canal normal.  Eyes: EOM are normal. Pupils are equal, round, and reactive to light.  Neck: Neck supple.  Cardiovascular: Regular rhythm.   Sinus brady with one (probable) PVC noted  Pulmonary/Chest: Effort normal and breath sounds normal.  Lymphadenopathy:    She has no cervical adenopathy.  Neurological: She is alert and oriented to person, place, and time. No cranial nerve deficit. Coordination normal.  Skin: Skin is warm and dry. No pallor.  Psychiatric: She has a normal mood and affect.          Assessment & Plan:  ECG:  Rate of 61. No ST elevation. Unchanged from prior ECG in 2014. T-wave inversion in V1 and V2.

## 2015-01-20 NOTE — Assessment & Plan Note (Signed)
Occurred this morning about 45 minutes after breakfast with diaphoresis and nausea. Orthostatic vitals negative. ECG unchanged from prior two years ago. TSH, CBC, CMP today. Referral to cardiology for further evaluation.

## 2015-01-20 NOTE — Patient Instructions (Signed)
Complete lab work prior to leaving today. I will notify you of your results. You will be contacted regarding your referral to Cardiology.  Please let us know if you have not heard back within one week.  Stay hydrated with water.  If you pass out again please call Dr. Glori Bickers immediately or go to the emergency department.  It was nice meeting you!

## 2015-01-20 NOTE — Progress Notes (Signed)
Pre visit review using our clinic review tool, if applicable. No additional management support is needed unless otherwise documented below in the visit note. 

## 2015-01-21 NOTE — Telephone Encounter (Signed)
Please notify Ms. Couzens that she may try taking Meclizine for her dizziness which may be purchased over the counter. Advise her to start with 1/2 tablet as this medication may make her drowsy. Thanks.

## 2015-01-22 NOTE — Telephone Encounter (Signed)
Pt returned your call. Please call back. She stated that if she didn't answer, feel free to leave message, thanks

## 2015-01-22 NOTE — Telephone Encounter (Signed)
I left a message for the patient to return my call.

## 2015-01-22 NOTE — Telephone Encounter (Signed)
Left detail message for patient as instructed. Notified her of Kate's comments.

## 2015-01-25 ENCOUNTER — Telehealth: Payer: Self-pay | Admitting: Family Medicine

## 2015-01-25 NOTE — Telephone Encounter (Signed)
Patient has an appointment scheduled with cardiology on 01/27/15. Sarah Harper, will you please ensure she is aware? Thanks.

## 2015-01-25 NOTE — Telephone Encounter (Signed)
Pt called she saw you Wednesday.  Pt stated she has not passed out again and she has fallen.  She is slightly dizzy and has a balance problem She did started taking half of  Meclizine  She has an appointment with cardiology in [place] 03/04/15  Is there anything else she should be doing

## 2015-01-26 NOTE — Telephone Encounter (Signed)
Called patient and she is aware. She is still taking the half of meclizine. Still dizzy and off balance but stated a little better.

## 2015-01-27 ENCOUNTER — Encounter: Payer: Self-pay | Admitting: Cardiology

## 2015-01-27 ENCOUNTER — Ambulatory Visit (INDEPENDENT_AMBULATORY_CARE_PROVIDER_SITE_OTHER): Payer: Medicare Other | Admitting: Cardiology

## 2015-01-27 VITALS — BP 148/85 | HR 74 | Ht 66.0 in | Wt 133.8 lb

## 2015-01-27 DIAGNOSIS — H6980 Other specified disorders of Eustachian tube, unspecified ear: Secondary | ICD-10-CM

## 2015-01-27 DIAGNOSIS — I499 Cardiac arrhythmia, unspecified: Secondary | ICD-10-CM

## 2015-01-27 DIAGNOSIS — H8109 Meniere's disease, unspecified ear: Secondary | ICD-10-CM | POA: Diagnosis not present

## 2015-01-27 DIAGNOSIS — R55 Syncope and collapse: Secondary | ICD-10-CM

## 2015-01-27 NOTE — Assessment & Plan Note (Addendum)
1 episode occurring on June 1. She did not mention any nausea or diaphoresis to me, but she did to her PCP. At that time her orthostatic pressures were normal. Following the initial episode she noted some tachycardia but it did not notice that prior to. This does not sound like an arrhythmogenic syncope. She does not have a history of vasovagal type syncope, however they cannot be excluded. She did note some association with turning her head in certain ways. There may be some vertebral artery issues or potential subclavian steal.  If she has any recurrent episodes, and they're happening more frequently with [place] catches a monitor. Otherwise I don't think wearing a monitor for an indefinite period make sense. It doesn't sound arrhythmogenic so would not do a monitor either implantable or not at this time.  Plan:   Check carotid Dopplers to evaluate for any potential subclavian stenosis or significant carotid disease or even potentially vertebral disease.   Check 2-D echocardiogram for any potential structural other modalities.  We discussed importance of adequate hydration. This will help avoid orthostatic symptoms. She is not on any antihypertensives.

## 2015-01-27 NOTE — Assessment & Plan Note (Signed)
This is probably reasonable for her dizziness. I'm not sure if this is at all related to this one particular syncopal episode, however it did begin is her normal dizziness. It may have triggered some type of vagal response and she could've had some bradycardia.

## 2015-01-27 NOTE — Assessment & Plan Note (Signed)
Previously evaluated a few years ago noted to be mostly PACs. She did not complain of these features associated with her syncope. I would not treat mild palpitations.
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
## 2015-01-27 NOTE — Patient Instructions (Signed)
Medication Instructions:  Your physician recommends that you continue on your current medications as directed. Please refer to the Current Medication list given to you today.   Labwork: NONE  Testing/Procedures: Your physician has requested that you have a carotid duplex. This test is an ultrasound of the carotid arteries in your neck. It looks at blood flow through these arteries that supply the brain with blood. Allow one hour for this exam. There are no restrictions or special instructions.  Your physician has requested that you have an echocardiogram. Echocardiography is a painless test that uses sound waves to create images of your heart. It provides your doctor with information about the size and shape of your heart and how well your heart's chambers and valves are working. This procedure takes approximately one hour. There are no restrictions for this procedure.   Follow-Up: Your physician recommends that you schedule a follow-up appointment in: 1 month with Dr. Ellyn Hack (as long as both test are completed)  Any Other Special Instructions Will Be Listed Below (If Applicable).

## 2015-01-27 NOTE — Assessment & Plan Note (Signed)
Again I wonder if some of the dizziness could be associated with this problem leading to in her ear issues. Perhaps that triggered a potential vagal response.

## 2015-02-05 ENCOUNTER — Other Ambulatory Visit: Payer: Self-pay

## 2015-02-05 ENCOUNTER — Ambulatory Visit: Payer: Medicare Other

## 2015-02-05 ENCOUNTER — Other Ambulatory Visit: Payer: Medicare Other

## 2015-02-05 DIAGNOSIS — I6523 Occlusion and stenosis of bilateral carotid arteries: Secondary | ICD-10-CM | POA: Diagnosis not present

## 2015-02-05 DIAGNOSIS — R55 Syncope and collapse: Secondary | ICD-10-CM

## 2015-02-05 DIAGNOSIS — Z1231 Encounter for screening mammogram for malignant neoplasm of breast: Secondary | ICD-10-CM

## 2015-02-06 NOTE — Progress Notes (Signed)
Quick Note:  Echo results: Good news: Essentially normal echocardiogram and normal pump function and normal valve function. No regional wall motion abnormalities - No signs to suggest heart attack.. EF: 60-65%.  Mahalie Kanner W, MD     ______ 

## 2015-02-22 ENCOUNTER — Encounter: Payer: Self-pay | Admitting: Family Medicine

## 2015-02-28 ENCOUNTER — Telehealth: Payer: Self-pay | Admitting: Family Medicine

## 2015-02-28 DIAGNOSIS — E785 Hyperlipidemia, unspecified: Secondary | ICD-10-CM

## 2015-02-28 DIAGNOSIS — Z0000 Encounter for general adult medical examination without abnormal findings: Secondary | ICD-10-CM | POA: Insufficient documentation

## 2015-02-28 DIAGNOSIS — E039 Hypothyroidism, unspecified: Secondary | ICD-10-CM

## 2015-02-28 NOTE — Telephone Encounter (Signed)
-----   Message from Marchia Bond sent at 02/24/2015  1:43 PM EDT ----- Regarding: Cpx labs Mon 7/11, need orders please :-) Please order  future cpx labs for pt's upcoming lab appt. Thanks Aniceto Boss

## 2015-03-01 ENCOUNTER — Other Ambulatory Visit (INDEPENDENT_AMBULATORY_CARE_PROVIDER_SITE_OTHER): Payer: Medicare Other

## 2015-03-01 DIAGNOSIS — E785 Hyperlipidemia, unspecified: Secondary | ICD-10-CM | POA: Diagnosis not present

## 2015-03-01 DIAGNOSIS — Z0000 Encounter for general adult medical examination without abnormal findings: Secondary | ICD-10-CM | POA: Diagnosis not present

## 2015-03-01 LAB — LIPID PANEL
Cholesterol: 189 mg/dL (ref 0–200)
HDL: 70.3 mg/dL (ref >39.00)
LDL Cholesterol: 104 mg/dL — ABNORMAL HIGH (ref 0–99)
NonHDL: 118.7
Total CHOL/HDL Ratio: 3
Triglycerides: 72 mg/dL (ref 0.0–149.0)
VLDL: 14.4 mg/dL (ref 0.0–40.0)

## 2015-03-04 ENCOUNTER — Ambulatory Visit: Payer: Medicare Other | Admitting: Cardiovascular Disease

## 2015-03-05 ENCOUNTER — Encounter: Payer: Self-pay | Admitting: Family Medicine

## 2015-03-05 ENCOUNTER — Ambulatory Visit (INDEPENDENT_AMBULATORY_CARE_PROVIDER_SITE_OTHER): Payer: Medicare Other | Admitting: Family Medicine

## 2015-03-05 VITALS — BP 130/82 | HR 68 | Temp 97.7°F | Ht 65.75 in | Wt 134.8 lb

## 2015-03-05 DIAGNOSIS — Z0000 Encounter for general adult medical examination without abnormal findings: Secondary | ICD-10-CM | POA: Diagnosis not present

## 2015-03-05 DIAGNOSIS — E039 Hypothyroidism, unspecified: Secondary | ICD-10-CM

## 2015-03-05 DIAGNOSIS — Z13220 Encounter for screening for lipoid disorders: Secondary | ICD-10-CM

## 2015-03-05 DIAGNOSIS — M8580 Other specified disorders of bone density and structure, unspecified site: Secondary | ICD-10-CM

## 2015-03-05 MED ORDER — DEXLANSOPRAZOLE 60 MG PO CPDR: 60.0000 mg | DELAYED_RELEASE_CAPSULE | Freq: Every day | ORAL | Status: DC

## 2015-03-05 MED ORDER — LEVOTHYROXINE SODIUM 88 MCG PO TABS: 88.0000 ug | ORAL_TABLET | Freq: Every day | ORAL | Status: DC

## 2015-03-05 MED ORDER — LIDOCAINE 5 % EX OINT: 1.0000 "application " | TOPICAL_OINTMENT | CUTANEOUS | Status: DC | PRN

## 2015-03-05 NOTE — Patient Instructions (Signed)
If at any time you want a bone density test please let me know  Take your calcium and vitamin D Be very careful to prevent falls

## 2015-03-05 NOTE — Progress Notes (Signed)
Subjective:    Patient ID: Sarah Harper, female    DOB: 1941/07/19, 74 y.o.   MRN: [id number]  HPI Here for annual medicare wellness visit as well as chronic/acute medical problems along with annual preventative exam  I have personally reviewed the Medicare Annual Wellness questionnaire and have noted 1. The patient's medical and social history 2. Their use of alcohol, tobacco or illicit drugs 3. Their current medications and supplements 4. The patient's functional ability including ADL's, fall risks, home safety risks and hearing or visual             impairment. 5. Diet and physical activities 6. Evidence for depression or mood disorders  The patients weight, height, BMI have been recorded in the chart and visual acuity is per eye clinic.  I have made referrals, counseling and provided education to the patient based review of the above and I have provided the pt with a written personalized care plan for preventive services. Reviewed and updated provider list, see scanned forms.  Thinks she is doing pretty well  Had an episode that she saw Anda Kraft for - unsetting not knowing  Still has f/u with Dr Carman Ching is up 2 lb  bmi is 21  Stays active - not as much as she wants to be due to her back  Has tried some water exercise - it made it worse  Does her PT and goes to chiropractor   See scanned forms.  Routine anticipatory guidance given to patient.  See health maintenance. Colon cancer screening 3/12 - presumably 5 year recall/ will see  Breast cancer screening 3/15  - has her mammogram scheduled in 2 weeks  Self breast exam -no lumps or changes  Flu vaccine 10/15  Tetanus vaccine 5/13  Pneumovax- complete at this tie - last 7/15  Zoster vaccine 3/14  Bone density 7/13 - very mild osteopenia - does not want another one at this time  One fall - in a parking lot- tripped over anything (is ok) - is now being more careful  No fractures  Advance directive - has that written up    Cognitive function addressed- see scanned forms- and if abnormal then additional documentation follows.-no complaints  Perhaps word searches - now and then    PMH and SH reviewed  Meds, vitals, and allergies reviewed.   ROS: See HPI.  Otherwise negative.    BP Readings from Last 3 Encounters:  03/05/15 140/92  01/27/15 148/85  01/20/15 114/68    Hypothyroidism  Pt has no clinical changes No change in energy level/ hair or skin/ edema and no tremor Has some chronic fatigue  Lab Results  Component Value Date   TSH 2.31 01/20/2015     Last D level was normal      Chemistry      Component Value Date/Time   NA 135 01/20/2015 1038   K 4.3 01/20/2015 1038   CL 100 01/20/2015 1038   CO2 31 01/20/2015 1038   BUN 19 01/20/2015 1038   CREATININE 0.72 01/20/2015 1038      Component Value Date/Time   CALCIUM 9.6 01/20/2015 1038   ALKPHOS 76 01/20/2015 1038   AST 22 01/20/2015 1038   ALT 17 01/20/2015 1038   BILITOT 0.4 01/20/2015 1038      Lab Results  Component Value Date   WBC 9.1 01/20/2015   HGB 13.4 01/20/2015   HCT 40.8 01/20/2015   MCV 88.5 01/20/2015   PLT 259.0  01/20/2015    Lab Results  Component Value Date   CHOL 189 03/01/2015   CHOL 221* 02/17/2014   CHOL 222* 02/12/2013   Lab Results  Component Value Date   HDL 70.30 03/01/2015   HDL 103.80 02/17/2014   HDL 89.00 02/12/2013   Lab Results  Component Value Date   LDLCALC 104* 03/01/2015   LDLCALC 99 02/17/2014   Lab Results  Component Value Date   TRIG 72.0 03/01/2015   TRIG 93.0 02/17/2014   TRIG 77.0 02/12/2013   Lab Results  Component Value Date   CHOLHDL 3 03/01/2015   CHOLHDL 2 02/17/2014   CHOLHDL 2 02/12/2013   Lab Results  Component Value Date   LDLDIRECT 115.5 02/12/2013   LDLDIRECT 109.9 12/19/2010   LDLDIRECT 117.7 12/17/2009    Cholesterol is still very good - though HDL went down a bit   Patient Active Problem List   Diagnosis Date Noted  . Routine general medical  examination at a health care facility 02/28/2015  . Syncope and collapse 01/20/2015  . Encounter for Medicare annual wellness exam 02/06/2013  . Hyperlipidemia 08/27/2012  . Irregular heart beat 08/05/2012  . Gynecological examination 12/20/2010  . Screening for lipoid disorders 12/08/2010  . Eustachian tube dysfunction 11/29/2010  . BARRETTS ESOPHAGUS 10/01/2010  . Osteopenia 12/17/2009  . BACK PAIN, LUMBAR 07/02/2009  . IRRITABLE BOWEL SYNDROME 04/02/2009  . POSTMENOPAUSAL STATUS 12/10/2008  . Hypothyroidism 12/05/2007  . Meniere's disease 12/05/2007  . GERD 12/05/2007   Past Medical History  Diagnosis Date  . GERD (gastroesophageal reflux disease)   . Thyroid disease     hypothyroid  . Depression   . IBS (irritable bowel syndrome)   . ETD (eustachian tube dysfunction)   . Meniere's disease   . Migraine   . Osteoporosis   . DDD (degenerative disc disease)     chronic back pain   Past Surgical History  Procedure Laterality Date  . Lumbar disc surgery      L5   History  Substance Use Topics  . Smoking status: Never Smoker   . Smokeless tobacco: Not on file  . Alcohol Use: No     Comment: wine occasional   Family History  Problem Relation Age of Onset  . Diabetes Paternal Aunt   . Hypertension Maternal Grandmother   . Hypertension Maternal Grandfather    Allergies  Allergen Reactions  . Epinephrine    Current Outpatient Prescriptions on File Prior to Visit  Medication Sig Dispense Refill  . b complex vitamins tablet Take 1 tablet by mouth daily. 30 tablet 0  . butalbital-acetaminophen-caffeine (FIORICET) 50-325-40 MG per tablet Take 1 tablet by mouth every 6 (six) hours as needed. 90 tablet 3  . Calcium Carb-Cholecalciferol (CALCIUM 600 + D PO) Take 1 tablet by mouth daily.    . Cholecalciferol (VITAMIN D) 1000 UNITS capsule Take 1,000 Units by mouth daily.      Marland Kitchen dexlansoprazole (DEXILANT) 60 MG capsule Take 1 capsule (60 mg total) by mouth daily. 90 capsule  3  . fluticasone (FLONASE) 50 MCG/ACT nasal spray Place 2 sprays into the nose as needed.     Marland Kitchen levothyroxine (SYNTHROID, LEVOTHROID) 88 MCG tablet Take 1 tablet (88 mcg total) by mouth daily before breakfast. 90 tablet 3  . lidocaine (XYLOCAINE) 5 % ointment Apply 1 application topically as needed. 50 g 3  . meclizine (ANTIVERT) 25 MG tablet Take 12.5 mg by mouth daily.    . meloxicam (MOBIC) 7.5 MG  tablet Take 1 tablet (7.5 mg total) by mouth daily as needed for pain. With a meal 30 tablet 0  . PREMARIN vaginal cream APPLY SMALL AMOUNT VAGINALLY 2 TO 3 TIMES A WEEK 90 g 1  . pseudoephedrine (SUDAFED) 60 MG tablet Take 60 mg by mouth as needed.     . traMADol (ULTRAM) 50 MG tablet Take 50 mg by mouth as needed for pain.     No current facility-administered medications on file prior to visit.         Review of Systems     Objective:   Physical Exam        Assessment & Plan:

## 2015-03-07 NOTE — Assessment & Plan Note (Signed)
Hypothyroidism  Pt has no clinical changes No change in energy level/ hair or skin/ edema and no tremor Lab Results  Component Value Date   TSH 2.31 01/20/2015

## 2015-03-07 NOTE — Assessment & Plan Note (Signed)
Pt declines another dexa at this time  No recent fractures  Disc need for calcium/ vitamin D/ wt bearing exercise and bone density test every 2 y to monitor Disc safety/ fracture risk in detail

## 2015-03-07 NOTE — Assessment & Plan Note (Signed)
Reviewed health habits including diet and exercise and skin cancer prevention Reviewed appropriate screening tests for age  Also reviewed health mt list, fam hx and immunization status , as well as social and family history   See HPI Lab reviewed  Pt declines dexa  If at any time you want a bone density test please let me know  Take your calcium and vitamin D Be very careful to prevent falls

## 2015-03-07 NOTE — Assessment & Plan Note (Signed)
Overall good lipids HDL down some due to less activity - but still good overall Disc goals for lipids and reasons to control them Rev labs with pt Rev low sat fat diet in detail

## 2015-03-15 ENCOUNTER — Encounter: Payer: Self-pay | Admitting: Cardiovascular Disease

## 2015-03-15 ENCOUNTER — Ambulatory Visit (INDEPENDENT_AMBULATORY_CARE_PROVIDER_SITE_OTHER): Payer: Medicare Other | Admitting: Cardiovascular Disease

## 2015-03-15 VITALS — BP 120/70 | HR 72 | Ht 66.0 in | Wt 135.8 lb

## 2015-03-15 DIAGNOSIS — R0789 Other chest pain: Secondary | ICD-10-CM

## 2015-03-15 DIAGNOSIS — R55 Syncope and collapse: Secondary | ICD-10-CM

## 2015-03-15 DIAGNOSIS — E785 Hyperlipidemia, unspecified: Secondary | ICD-10-CM

## 2015-03-15 NOTE — Patient Instructions (Signed)
No medication changes were made. OK to change the timing of your morning pills  Please buy a blood pressure cuff to monitor your vitals when you have episodes of near-syncope Please call with your numbers when you have an episodes  Please call us if you have new issues that need to be addressed before your next appt.  Your physician wants you to follow-up in: 2 month.

## 2015-03-15 NOTE — Assessment & Plan Note (Addendum)
She continues to have mild symptoms in the mornings, 1 time per week. Recommended she buy a blood pressure cuff and check her vitals when she has these episodes. Certainly if any abnormal heart rate, would order a 30 day monitor. If she has low blood pressure, possible vasovagal in etiology. Could consider Florinef every other day She will try to change the timing of her morning medications

## 2015-03-15 NOTE — Progress Notes (Signed)
Patient ID: Sarah Harper, female    DOB: 1941/04/23, 74 y.o.   MRN: [id number]  HPI Comments: Ms. Sarah Harper is a very pleasant 74 year old woman previously evaluated for palpitations, history of GERD, who presents for follow-up of her recent lightheaded spells  Previously seen by Dr. Ellyn Hack for episode of syncope, seem to have a episode wall at the dining room table after eating. Workup was essentially normal including carotid ultrasound and echocardiogram. She reports having continued symptoms typically presenting after breakfast, once per week Symptoms described as a lightheadedness, chest discomfort, warm feeling all over. Symptoms seem to last for a very short period of time She has stopped all of her pain medication. Does not seem to have symptoms later in the day. She wonders if it could be from some of her morning medications such as her thyroid medication. She does not have a blood pressure cuff at home and does not know where her blood pressures running when she has these episodes   EKG on today's visit shows normal sinus rhythm with rate 72 bpm, no significant ST or T-wave changes   Allergies  Allergen Reactions  . Epinephrine     Current Outpatient Prescriptions on File Prior to Visit  Medication Sig Dispense Refill  . b complex vitamins tablet Take 1 tablet by mouth daily. 30 tablet 0  . butalbital-acetaminophen-caffeine (FIORICET) 50-325-40 MG per tablet Take 1 tablet by mouth every 6 (six) hours as needed. 90 tablet 3  . Calcium Carb-Cholecalciferol (CALCIUM 600 + D PO) Take 1 tablet by mouth daily.    . Cholecalciferol (VITAMIN D) 1000 UNITS capsule Take 1,000 Units by mouth daily.      Marland Kitchen dexlansoprazole (DEXILANT) 60 MG capsule Take 1 capsule (60 mg total) by mouth daily. 90 capsule 3  . fluticasone (FLONASE) 50 MCG/ACT nasal spray Place 2 sprays into the nose as needed.     Marland Kitchen levothyroxine (SYNTHROID, LEVOTHROID) 88 MCG tablet Take 1 tablet (88 mcg total) by mouth  daily before breakfast. 90 tablet 3  . lidocaine (XYLOCAINE) 5 % ointment Apply 1 application topically as needed. 50 g 3  . meclizine (ANTIVERT) 25 MG tablet Take 12.5 mg by mouth as needed.     . meloxicam (MOBIC) 7.5 MG tablet Take 1 tablet (7.5 mg total) by mouth daily as needed for pain. With a meal 30 tablet 0  . PREMARIN vaginal cream APPLY SMALL AMOUNT VAGINALLY 2 TO 3 TIMES A WEEK 90 g 1  . pseudoephedrine (SUDAFED) 60 MG tablet Take 60 mg by mouth daily.     . traMADol (ULTRAM) 50 MG tablet Take 50 mg by mouth as needed for pain.     No current facility-administered medications on file prior to visit.    Past Medical History  Diagnosis Date  . GERD (gastroesophageal reflux disease)   . Thyroid disease     hypothyroid  . Depression   . IBS (irritable bowel syndrome)   . ETD (eustachian tube dysfunction)   . Meniere's disease   . Migraine   . Osteoporosis   . DDD (degenerative disc disease)     chronic back pain    Past Surgical History  Procedure Laterality Date  . Lumbar disc surgery      L5    Social History  reports that she has never smoked. She does not have any smokeless tobacco history on file. She reports that she drinks about 0.6 oz of alcohol per week. She reports that she does  not use illicit drugs.  Family History family history includes Diabetes in her paternal aunt; Hypertension in her maternal grandfather and maternal grandmother.  Review of Systems  Constitutional: Negative.   Respiratory: Negative.   Cardiovascular: Positive for palpitations.  Gastrointestinal: Negative.   Musculoskeletal: Negative.   Skin: Negative.   Neurological: Positive for light-headedness.  Psychiatric/Behavioral: Negative.   All other systems reviewed and are negative.   BP 120/70 mmHg  Pulse 72  Ht 5\' 6"  (1.676 m)  Wt 135 lb 12 oz (61.576 kg)  BMI 21.92 kg/m2  Physical Exam  Constitutional: She is oriented to person, place, and time. She appears  well-developed and well-nourished.  HENT:  Head: Normocephalic.  Nose: Nose normal.  Mouth/Throat: Oropharynx is clear and moist.  Eyes: Conjunctivae are normal. Pupils are equal, round, and reactive to light.  Neck: Normal range of motion. Neck supple. No JVD present.  Cardiovascular: Normal rate, regular rhythm, S1 normal, S2 normal, normal heart sounds and intact distal pulses.  Exam reveals no gallop and no friction rub.   No murmur heard. Pulmonary/Chest: Effort normal and breath sounds normal. No respiratory distress. She has no wheezes. She has no rales. She exhibits no tenderness.  Abdominal: Soft. Bowel sounds are normal. She exhibits no distension. There is no tenderness.  Musculoskeletal: Normal range of motion. She exhibits no edema or tenderness.  Lymphadenopathy:    She has no cervical adenopathy.  Neurological: She is alert and oriented to person, place, and time. Coordination normal.  Skin: Skin is warm and dry. No rash noted. No erythema.  Psychiatric: She has a normal mood and affect. Her behavior is normal. Judgment and thought content normal.    Assessment and Plan  Nursing note and vitals reviewed.

## 2015-03-15 NOTE — Assessment & Plan Note (Signed)
Currently not on a statin °

## 2015-03-16 ENCOUNTER — Ambulatory Visit
Admission: RE | Admit: 2015-03-16 | Discharge: 2015-03-16 | Disposition: A | Discharge status: Home / Self Care | Payer: Medicare Other | Source: Ambulatory Visit

## 2015-03-16 DIAGNOSIS — Z1231 Encounter for screening mammogram for malignant neoplasm of breast: Secondary | ICD-10-CM

## 2015-03-20 ENCOUNTER — Encounter: Payer: Self-pay | Admitting: Cardiovascular Disease

## 2015-05-14 ENCOUNTER — Ambulatory Visit (INDEPENDENT_AMBULATORY_CARE_PROVIDER_SITE_OTHER): Payer: Medicare Other

## 2015-05-14 DIAGNOSIS — Z23 Encounter for immunization: Secondary | ICD-10-CM

## 2015-05-20 ENCOUNTER — Ambulatory Visit: Payer: Medicare Other | Admitting: Cardiovascular Disease

## 2015-05-25 ENCOUNTER — Encounter: Payer: Self-pay | Admitting: Family Medicine

## 2015-05-27 ENCOUNTER — Telehealth: Payer: Self-pay | Admitting: Family Medicine

## 2015-05-27 MED ORDER — LIDOCAINE 5 % EX OINT: 1.0000 "application " | TOPICAL_OINTMENT | CUTANEOUS | Status: DC | PRN

## 2015-05-27 NOTE — Telephone Encounter (Signed)
lidocane refil

## 2015-06-09 ENCOUNTER — Ambulatory Visit (INDEPENDENT_AMBULATORY_CARE_PROVIDER_SITE_OTHER): Payer: Medicare Other | Admitting: Podiatry

## 2015-06-09 ENCOUNTER — Encounter: Payer: Self-pay | Admitting: Podiatry

## 2015-06-09 VITALS — BP 129/78 | HR 77 | Resp 18

## 2015-06-09 DIAGNOSIS — Q828 Other specified congenital malformations of skin: Secondary | ICD-10-CM

## 2015-06-09 NOTE — Progress Notes (Signed)
She presents today chief complaint of a painful lesion plantar aspect of the left foot. She states think it has a new friend growing on the heel. She denies any changes in her past medical history medications allergy surgeries or social history.  Objective: Vital signs are stable alert and oriented 3. Pulses are strongly palpable. Neurologic sensorium is intact. Porokeratosis subsecond metatarsophalangeal joint and a single one centrally located in the plantar left heel as well.  Assessment: Porokeratosis bilateral.  Plan: Debridement of 2 lesions. Follow up with me as needed.

## 2015-06-21 ENCOUNTER — Encounter: Payer: Self-pay | Admitting: Gastroenterology

## 2015-06-28 ENCOUNTER — Ambulatory Visit: Payer: Medicare Other | Attending: Physical Medicine & Rehabilitation | Admitting: Physical Therapy

## 2015-06-28 ENCOUNTER — Encounter: Payer: Self-pay | Admitting: Physical Therapy

## 2015-06-28 DIAGNOSIS — M629 Disorder of muscle, unspecified: Secondary | ICD-10-CM | POA: Diagnosis present

## 2015-06-28 DIAGNOSIS — R279 Unspecified lack of coordination: Secondary | ICD-10-CM

## 2015-06-28 NOTE — Patient Instructions (Signed)
  Scar massage along back and belly massage 5 min with coconut/olive oil each night                                                            Preserve the function of your pelvic floor, abdomen, and back.              Avoid decreased straining of abdominal/pelvic floor muscles with less slouching,  holding your breath with lifting/bowel movements)                                                     FUNCTIONAL POSTURES

## 2015-06-28 NOTE — Therapy (Addendum)
[place] MAIN [facility] SERVICES [address] [place], [place], [address] Phone: [phone]   Fax:  [phone]  Physical Therapy Evaluation  Patient Details  Name: Sarah Harper MRN: [id number] Date of Birth: [birth year] Referring Provider: Angelene Giovanni   Encounter Date: 06/28/2015      PT End of Session - 06/28/15 1149    Visit Number 1   Number of Visits 12   Date for PT Re-Evaluation 09/06/15   Authorization Type g-code (10th visit)    PT Start Time 1015   PT Stop Time 1115   PT Time Calculation (min) 60 min   Activity Tolerance Patient tolerated treatment well;No increased pain   Behavior During Therapy [facility] for tasks assessed/performed      Past Medical History  Diagnosis Date  . GERD (gastroesophageal reflux disease)   . Thyroid disease     hypothyroid  . IBS (irritable bowel syndrome)   . ETD (eustachian tube dysfunction)   . Meniere's disease   . Migraine   . DDD (degenerative disc disease)     chronic back pain  . Osteoporosis     Past Surgical History  Procedure Laterality Date  . Lumbar disc surgery  1984    L5   . Appendectomy      There were no vitals filed for this visit.  Visit Diagnosis:  Fascial defect - Plan: PT plan of care cert/re-cert,   Lack of coordination - Plan: PT plan of care cert/re-cert      Subjective Assessment - 06/28/15 1141    Subjective Pt reported LBP for the past 5 years that is related DDD. Pt has undergone PT, chiropractic Tx, and spinal injections from an orthopedic doctor. Pt was explained that her piriformis is the source of her pain along with the SIJ.  Pt expressed frustration about having poor outcomes with previous Tx. Pt reported having PT which pt found the exercises to not be helpful as they caused  knee pain.  Pain  4/10 at its best, 6/10 at its worst.  Walking, sitting, bending, shopping  45 min, getting in and out a car, lifting groceries. Hx of IBS that is managed with  Linzess and  occassionally take Benefiber with Liness, or Miralax without Linzess when traveling.  Bowel movements with medications: 1x daily  [place] 3-4 Type, without medications Type 2 and with straining needed. Urinary Sx: with sneeze.  Sexual Function: pt requires a pillow under back.       Pertinent History Hx of scars: appendectomy and lumbar    Patient Stated Goals 1)  be able to walk > 0.5 mile at a tolerable level of pain at 4/10, 2) travel in a car > 20-30 min as driver and passenger             [facility] PT Assessment - 06/28/15 1047    Assessment   Medical Diagnosis LBP   Referring Provider Carneiro    Precautions   Precautions None   Restrictions   Weight Bearing Restrictions No   Balance Screen   Has the patient fallen in the past 6 months No   Prior Function   Level of Independence Independent   Observation/Other Assessments   Oswestry Disability Index  40%   Coordination   Gross Motor Movements are Fluid and Coordinated --  lumbopelvic instability (obliques) w/ hip flexion in hooklyi   Fine Motor Movements are Fluid and Coordinated --  chest breathing dominant, limited diaphragmatic excursion   Squat  Comments narrow BOS    Other:   Other/ Comments lifting with breathholding, getting into car with breathhing (buttock first) .    Posture/Postural Control   Posture Comments abdominal/pelvic straining with cue for bowel movement and stopping urination   AROM   Overall AROM Comments flexion ~60 deg with pain on R SIJ area, L sidebend is limited by pain   Strength   Overall Strength --  hip flex/knee flex/ext/anle DF 4/5. Hip ext R 4/5 with pain    Palpation   SI assessment  pain w/ palpation at bilateraly PSIS, decreased pain with cue for exhalation    Palpation comment scars over LQ abdomen and lumbar with limited mobility   Transfers   Five time sit to stand comments  22.25 sec with UE support on arm rests. Pain with L knee post 5 trials   Ambulation/Gait    Ambulation Distance (Feet) 10 Feet   Gait velocity 1.04 m/s                 Pelvic Floor Special Questions - 06/28/15 1115    Diastasis Recti 2.5 fingers above umbilicus and 2 below          [facility] Adult PT Treatment/Exercise - 06/28/15 1047    Self-Care   Self-Care --  POC, goals, self scar massage   Therapeutic Activites    Lifting body mechanics, alignment of feet and object   Neuro Re-ed    Neuro Re-ed Details  deep core coordination, exhalation with lifting, sit to stand, in and out of car    Manual Therapy   Myofascial Release lumbar sacr massage            PT Long Term Goals - 06/28/15 1216    PT LONG TERM GOAL #1   Title Pt will decrease her score on ODI from 40% to < 25% in order to return ADLs.   Time 12   Period Weeks   Status New   PT LONG TERM GOAL #2   Title Pt will report being able to walk > .5 mile at a pain level < 4/10 in order to return to ADLs.   Time 12   Period Weeks   Status New   PT LONG TERM GOAL #3   Title Pt will demo decrease abdominal separation of <2 fingers with ability to lift foot off bed in hooklying to demo increase ability to withstand load and progress towards upright strengthening exercises.    Time 12   Period Weeks   Status New   PT LONG TERM GOAL #4   Title Pt will report being able to tolerate sitting in a car for > 30 min in order to participate in community events.   Time 12   Period Weeks   Status New   PT LONG TERM GOAL #5   Title Pt will demo decreased time with 5TST (22 sec to < 15 sec with UE support) and increased gait speed from 1.04 m/s to > 1.13 m/s in order to decrease risks for falls and cross streets.    Time 12   Period Weeks   Status New   PT LONG TERM GOAL #6   Title Pt will report Stool Type 3-4 without medication/fiber supplements in order to demo improved pelvic floor function.    Time 12   Period Weeks                PT Education - 06/28/15 1146    Education  provided Yes    Education Details POC, anatomy/ physiology, goals, HEP, principles of the deep core mm system    Person(s) Educated Patient   Methods Explanation;Demonstration;Tactile cues;Verbal cues;Handout   Comprehension Verbalized understanding;Returned demonstration                                                                                                                      Plan - 07-26-2015 1151    Clinical Impression Statement Pt is a 74 yo female whose S & Sx consist of chronic LBP, Hx of DDD, lumbar surgery, IBS,  abdominal and lumbar scar limitation, abdominal separation (DRA), pelvic girdle instability, poor coordination of deep core mm/strength, poor pelvic floor function with Hx of constipation, and poor body mechanics.  These deficits limit her ability to sit, stand, lifting, and bowel function.    Pt will benefit from skilled therapeutic intervention in order to improve on the following deficits Abnormal gait;Improper body mechanics;Pain;Increased fascial restricitons;Increased muscle spasms;Postural dysfunction;Decreased mobility;Decreased coordination;Decreased scar mobility;Decreased safety awareness;Decreased balance;Decreased activity tolerance;Decreased endurance;Decreased range of motion;Decreased strength;Hypomobility;Difficulty walking;Impaired flexibility   Rehab Potential Good   PT Frequency 1x / week   PT Duration 8 weeks   PT Treatment/Interventions ADLs/Self Care Home Management;Aquatic Therapy;Cryotherapy;Electrical Stimulation;Biofeedback;Moist Heat;Traction;Gait training;Neuromuscular re-education;Balance training;Therapeutic exercise;Therapeutic activities;Functional mobility training;Stair training;Patient/family education;Scar mobilization;Manual techniques;Taping;Energy conservation;Dry needling;Other (comment)  pain science education   Consulted and Agree with Plan of Care Patient          G-Codes - 2015-07-26 1203    Functional  Assessment Tool Used ODI 40%   Functional Limitation Mobility: Walking and moving around   Mobility: Walking and Moving Around Current Status [phone]) At least 40 percent but less than 60 percent impaired, limited or restricted   Mobility: Walking and Moving Around Goal Status [phone]) At least 20 percent but less than 40 percent impaired, limited or restricted       Problem List Patient Active Problem List   Diagnosis Date Noted  . Routine general medical examination at a health care facility 02/28/2015  . Syncope and collapse 01/20/2015  . Encounter for Medicare annual wellness exam 02/06/2013  . Hyperlipidemia 08/27/2012  . Irregular heart beat 08/05/2012  . Gynecological examination 12/20/2010  . Screening for lipoid disorders 12/08/2010  . Eustachian tube dysfunction 11/29/2010  . BARRETTS ESOPHAGUS 10/01/2010  . Osteopenia 12/17/2009  . BACK PAIN, LUMBAR 07/02/2009  . IRRITABLE BOWEL SYNDROME 04/02/2009  . POSTMENOPAUSAL STATUS 12/10/2008  . Hypothyroidism 12/05/2007  . Meniere's disease 12/05/2007  . GERD 12/05/2007    Sara Chu, DPT, E-RYT  26-Jul-2015, 12:17 PM  [place] MAIN [facility] SERVICES [address] [place], [place], [address] Phone: [phone]   Fax:  [phone]  Name: Sarah Harper MRN: [id number] Date of Birth: 08-18-1941

## 2015-07-08 ENCOUNTER — Ambulatory Visit: Payer: Medicare Other | Admitting: Physical Therapy

## 2015-07-08 DIAGNOSIS — R279 Unspecified lack of coordination: Secondary | ICD-10-CM

## 2015-07-08 DIAGNOSIS — M629 Disorder of muscle, unspecified: Secondary | ICD-10-CM | POA: Diagnosis not present

## 2015-07-08 NOTE — Patient Instructions (Addendum)
You are now ready to begin training the deep core muscles system: diaphragm, transverse abdominis, pelvic floor . These muscles must work together as a team.      1) Cat Cow exercise incorporate the breath  Inhale -sway your back down (cow)  Exhale- roll belly in , zip the zipper (cat)     2)  The key to these exercises to train the brain to coordinate the timing of these muscles and to have them turn on for long periods of time to hold you upright against gravity (especially important if you are on your feet all day).These muscles are postural muscles and play a role stabilizing your spine and bodyweight. By doing these repetitions slowly and correctly instead of doing crunches, you will achieve a flatter belly without a lower pooch. You are also placing your spine in a more neutral position and breathing properly which in turn, decreases your risk for problems related to your pelvic floor, abdominal, and low back such as pelvic organ prolapse, hernias, diastasis recti (separation of superficial muscles), disk herniations, spinal fractures. These exercises set a solid foundation for you to later progress to resistance/ strength training with therabands and weights and return to other typical fitness exercises with a stronger deeper core.    Do only Level 1 with pillow under hips, 30 rep with rest break after 10 each,  2 x day

## 2015-07-09 NOTE — Therapy (Signed)
[place] MAIN [facility] SERVICES [address] [place], [place], [address] Phone: [phone]   Fax:  [phone]  Physical Therapy Treatment  Patient Details  Name: Sarah Harper MRN: [id number] Date of Birth: 1941-01-04 Referring Provider: Angelene Giovanni   Encounter Date: 07/08/2015    Past Medical History  Diagnosis Date  . GERD (gastroesophageal reflux disease)   . Thyroid disease     hypothyroid  . IBS (irritable bowel syndrome)   . ETD (eustachian tube dysfunction)   . Meniere's disease   . Migraine   . DDD (degenerative disc disease)     chronic back pain  . Osteoporosis     Past Surgical History  Procedure Laterality Date  . Lumbar disc surgery  1984    L5   . Appendectomy      There were no vitals filed for this visit.  Visit Diagnosis:  Fascial defect  Lack of coordination      Subjective Assessment - 07/08/15 1411    Subjective Pt noticed her potty stool helps but she still has to strain a little.    Pertinent History Hx of scars: appendectomy and lumbar    Patient Stated Goals 1)  be able to walk > 0.5 mile at a tolerable level of pain at 4/10, 2) travel in a car > 20-30 min as driver and passenger                                       PT Long Term Goals - 06/28/15 1216    PT LONG TERM GOAL #1   Title Pt will decrease her score on ODI from 40% to < 25% in order to return ADLs.   Time 12   Period Weeks   Status New   PT LONG TERM GOAL #2   Title Pt will report being able to walk > .5 mile at a pain level < 4/10 in order to return to ADLs.   Time 12   Period Weeks   Status New   PT LONG TERM GOAL #3   Title Pt will demo decrease abdominal separation of <2 fingers with ability to lift foot off bed in hooklying to demo increase ability to withstand load and progress towards upright strengthening exercises.    Time 12   Period Weeks   Status New   PT LONG TERM GOAL #4   Title Pt  will report being able to tolerate sitting in a car for > 30 min in order to participate in community events.   Time 12   Period Weeks   Status New   PT LONG TERM GOAL #5   Title Pt will demo decreased time with 5TST (22 sec to < 15 sec with UE support) and increased gait speed from 1.04 m/s to > 1.13 m/s in order to decrease risks for falls and cross streets.    Time 12   Period Weeks   Status New   PT LONG TERM GOAL #6   Title Pt will report Stool Type 3-4 without medication/fiber supplements in order to demo improved pelvic floor function.    Time 12   Period Weeks               Problem List Patient Active Problem List   Diagnosis Date Noted  . Routine general medical examination at a health care facility 02/28/2015  . Syncope  and collapse 01/20/2015  . Encounter for Medicare annual wellness exam 02/06/2013  . Hyperlipidemia 08/27/2012  . Irregular heart beat 08/05/2012  . Gynecological examination 12/20/2010  . Screening for lipoid disorders 12/08/2010  . Eustachian tube dysfunction 11/29/2010  . BARRETTS ESOPHAGUS 10/01/2010  . Osteopenia 12/17/2009  . BACK PAIN, LUMBAR 07/02/2009  . IRRITABLE BOWEL SYNDROME 04/02/2009  . POSTMENOPAUSAL STATUS 12/10/2008  . Hypothyroidism 12/05/2007  . Meniere's disease 12/05/2007  . GERD 12/05/2007    Jerl Mina  ,PT, DPT, E-RYT  07/09/2015, 4:18 PM  [place] MAIN [facility] SERVICES [address] [place], [place], [address] Phone: [phone]   Fax:  [phone]  Name: Sarah Harper MRN: [id number] Date of Birth: [birth year]

## 2015-07-12 ENCOUNTER — Ambulatory Visit: Payer: Medicare Other | Admitting: Physical Therapy

## 2015-07-12 DIAGNOSIS — M629 Disorder of muscle, unspecified: Secondary | ICD-10-CM | POA: Diagnosis not present

## 2015-07-12 DIAGNOSIS — R279 Unspecified lack of coordination: Secondary | ICD-10-CM

## 2015-07-13 NOTE — Patient Instructions (Addendum)
You are now ready to begin training the deep core muscles system: diaphragm, transverse abdominis, pelvic floor . These muscles must work together as a team.           The key to these exercises to train the brain to coordinate the timing of these muscles and to have them turn on for long periods of time to hold you upright against gravity (especially important if you are on your feet all day).These muscles are postural muscles and play a role stabilizing your spine and bodyweight. By doing these repetitions slowly and correctly instead of doing crunches, you will achieve a flatter belly without a lower pooch. You are also placing your spine in a more neutral position and breathing properly which in turn, decreases your risk for problems related to your pelvic floor, abdominal, and low back such as pelvic organ prolapse, hernias, diastasis recti (separation of superficial muscles), disk herniations, spinal fractures. These exercises set a solid foundation for you to later progress to resistance/ strength training with therabands and weights and return to other typical fitness exercises with a stronger deeper core.    Progress to Level 2 this week     Practice log rolling  Practice exhaling with stair navigation and placing feet under hips Ensure vertical towel for back support in car seat Practice seated breathing with exhalation for stabilizing spine with perturbations of car when riding in car during trip

## 2015-07-14 NOTE — Therapy (Signed)
[place] MAIN [facility] SERVICES [address] [place], [place], [address] Phone: [phone]   Fax:  [phone]  Physical Therapy Treatment  Patient Details  Name: Sarah Harper MRN: [id number] Date of Birth: 01/14/1941 Referring Provider: Angelene Giovanni   Encounter Date: 07/12/2015      PT End of Session - 07/14/15 0753    Visit Number 3   Number of Visits 12   Date for PT Re-Evaluation 09/06/15   Authorization Type g-code (10th visit)    PT Start Time 1100   PT Stop Time 1210   PT Time Calculation (min) 70 min   Activity Tolerance Patient tolerated treatment well;No increased pain   Behavior During Therapy [facility] for tasks assessed/performed      Past Medical History  Diagnosis Date  . GERD (gastroesophageal reflux disease)   . Thyroid disease     hypothyroid  . IBS (irritable bowel syndrome)   . ETD (eustachian tube dysfunction)   . Meniere's disease   . Migraine   . DDD (degenerative disc disease)     chronic back pain  . Osteoporosis     Past Surgical History  Procedure Laterality Date  . Lumbar disc surgery  1984    L5   . Appendectomy      There were no vitals filed for this visit.  Visit Diagnosis:  Fascial defect  Lack of coordination      Subjective Assessment - 07/14/15 0752    Subjective Pt feels 6% improvement since the last visit.   Pertinent History Hx of scars: appendectomy and lumbar    Patient Stated Goals 1)  be able to walk > 0.5 mile at a tolerable level of pain at 4/10, 2) travel in a car > 20-30 min as driver and passenger             [facility] PT Assessment - 07/14/15 0752    Squat   Comments lifting with narrow BOS in squat    Bed Mobility   Bed Mobility --  cued for log rolling steps   Ambulation/Gait   Stair Management Technique One rail Right;Alternating pattern  narrow base of support                   Pelvic Floor Special Questions - 07/14/15 0752    Diastasis Recti 1  finger below umbilicus post-Tx   Pelvic Floor Internal Exam pt consented verbally without contraindications   Exam Type Vaginal   Palpation bladder in more dorasal positoin in hooklying. no pillow required under hips    Strength fair squeeze, definite lift   Biofeedback cues for proper coordination, less effort and straining in abdomen           [facility] Adult PT Treatment/Exercise - 07/14/15 0752    Transfers   Five time sit to stand comments  cues for wider BOS on rise (pt reported decreased pain)    Self-Care   Self-Care --  car seat modifications   Therapeutic Activites    Lifting cues for wider BOS and exhalation 5 reps   Neuro Re-ed    Neuro Re-ed Details  reviewed HEP, progressed dynamic stabilization 2  , 10 reps x 2, pelvic floor mm cooridnation     Moist Heat Therapy   Number Minutes Moist Heat 10 Minutes   Moist Heat Location Other (comment)  SIJ   Manual Therapy   Joint Mobilization rotational mob R    Myofascial Release R PSIS region  PT Education - 07/14/15 0752    Education provided Yes   Education Details HEP   Person(s) Educated Patient   Methods Explanation;Demonstration;Tactile cues;Verbal cues   Comprehension Verbalized understanding;Returned demonstration             PT Long Term Goals - 06/28/15 1216    PT LONG TERM GOAL #1   Title Pt will decrease her score on ODI from 40% to < 25% in order to return ADLs.   Time 12   Period Weeks   Status New   PT LONG TERM GOAL #2   Title Pt will report being able to walk > .5 mile at a pain level < 4/10 in order to return to ADLs.   Time 12   Period Weeks   Status New   PT LONG TERM GOAL #3   Title Pt will demo decrease abdominal separation of <2 fingers with ability to lift foot off bed in hooklying to demo increase ability to withstand load and progress towards upright strengthening exercises.    Time 12   Period Weeks   Status New   PT LONG TERM GOAL #4   Title Pt will  report being able to tolerate sitting in a car for > 30 min in order to participate in community events.   Time 12   Period Weeks   Status New   PT LONG TERM GOAL #5   Title Pt will demo decreased time with 5TST (22 sec to < 15 sec with UE support) and increased gait speed from 1.04 m/s to > 1.13 m/s in order to decrease risks for falls and cross streets.    Time 12   Period Weeks   Status New   PT LONG TERM GOAL #6   Title Pt will report Stool Type 3-4 without medication/fiber supplements in order to demo improved pelvic floor function.    Time 12   Period Weeks               Plan - 07/14/15 0754    Clinical Impression Statement Pt is progressing well with decreased abdominal separation and demonstrated improved breathing/pelvic floor coordination after motor control training cues to apply less effort. Anticipate pt will advanace towards endurance pelvic floor training at next session which will improve her pelvic girdle instability as research shows pelvic floor dysfunction is associated among patients low back pain. Pt has advanced towards deep core strengthening today and demonstrated a need for propioception training with feet placement (wider BOS) with stair navigation and lifting mechanics in order to minmize SIJ complaints. Pt reported deep core activation and feet alignment cues helped to decrease her knee pain with stairs to some degree.  Pt will continue to benefit from skilled PT.    Pt will benefit from skilled therapeutic intervention in order to improve on the following deficits Abnormal gait;Improper body mechanics;Pain;Increased fascial restricitons;Increased muscle spasms;Postural dysfunction;Decreased mobility;Decreased coordination;Decreased scar mobility;Decreased safety awareness;Decreased balance;Decreased activity tolerance;Decreased endurance;Decreased range of motion;Decreased strength;Hypomobility;Difficulty walking;Impaired flexibility   Rehab Potential Good    PT Frequency 1x / week   PT Duration 8 weeks   PT Treatment/Interventions ADLs/Self Care Home Management;Aquatic Therapy;Cryotherapy;Electrical Stimulation;Biofeedback;Moist Heat;Traction;Gait training;Neuromuscular re-education;Balance training;Therapeutic exercise;Therapeutic activities;Functional mobility training;Stair training;Patient/family education;Scar mobilization;Manual techniques;Taping;Energy conservation;Dry needling;Other (comment)  pain science education   Consulted and Agree with Plan of Care Patient        Problem List Patient Active Problem List   Diagnosis Date Noted  . Routine general medical examination at a health care  facility 02/28/2015  . Syncope and collapse 01/20/2015  . Encounter for Medicare annual wellness exam 02/06/2013  . Hyperlipidemia 08/27/2012  . Irregular heart beat 08/05/2012  . Gynecological examination 12/20/2010  . Screening for lipoid disorders 12/08/2010  . Eustachian tube dysfunction 11/29/2010  . BARRETTS ESOPHAGUS 10/01/2010  . Osteopenia 12/17/2009  . BACK PAIN, LUMBAR 07/02/2009  . IRRITABLE BOWEL SYNDROME 04/02/2009  . POSTMENOPAUSAL STATUS 12/10/2008  . Hypothyroidism 12/05/2007  . Meniere's disease 12/05/2007  . GERD 12/05/2007    Jerl Mina ,PT, DPT, E-RYT  07/14/2015, 8:02 AM  [place] MAIN [facility] SERVICES [address] [place], [place], [address] Phone: [phone]   Fax:  [phone]  Name: TIMOLIN LININGER MRN: [id number] Date of Birth: [birth year]

## 2015-07-19 ENCOUNTER — Encounter: Payer: Medicare Other | Admitting: Physical Therapy

## 2015-07-22 ENCOUNTER — Ambulatory Visit: Payer: Medicare Other | Attending: Physical Medicine & Rehabilitation | Admitting: Physical Therapy

## 2015-07-22 DIAGNOSIS — R279 Unspecified lack of coordination: Secondary | ICD-10-CM | POA: Diagnosis present

## 2015-07-22 DIAGNOSIS — M629 Disorder of muscle, unspecified: Secondary | ICD-10-CM | POA: Insufficient documentation

## 2015-07-23 NOTE — Patient Instructions (Signed)
Continue with Dynamic Stab Level 2

## 2015-07-23 NOTE — Therapy (Signed)
[place] MAIN [facility] SERVICES [address] [place], [place], [address] Phone: [phone]   Fax:  [phone]  Physical Therapy Treatment  Patient Details  Name: Sarah Harper MRN: [id number] Date of Birth: 14-Feb-1941 Referring Provider: Angelene Giovanni   Encounter Date: 07/22/2015      PT End of Session - 07/23/15 1316    Visit Number 4   Number of Visits 12   Date for PT Re-Evaluation 09/06/15   Authorization Type g-code (10th visit)    PT Start Time 1015   PT Stop Time 1115   PT Time Calculation (min) 60 min   Activity Tolerance Patient tolerated treatment well;No increased pain   Behavior During Therapy [facility] for tasks assessed/performed      Past Medical History  Diagnosis Date  . GERD (gastroesophageal reflux disease)   . Thyroid disease     hypothyroid  . IBS (irritable bowel syndrome)   . ETD (eustachian tube dysfunction)   . Meniere's disease   . Migraine   . DDD (degenerative disc disease)     chronic back pain  . Osteoporosis     Past Surgical History  Procedure Laterality Date  . Lumbar disc surgery  1984    L5   . Appendectomy      There were no vitals filed for this visit.  Visit Diagnosis:  Fascial defect  Lack of coordination      Subjective Assessment - 07/22/15 1015    Subjective Pt reported she felt sore for a week over her R buttock and today she also feels her back muscle is tight. Pt had pain with her car ride to [facility].     Pertinent History Hx of scars: appendectomy and lumbar    Patient Stated Goals 1)  be able to walk > 0.5 mile at a tolerable level of pain at 4/10, 2) travel in a car > 20-30 min as driver and passenger    Currently in Pain? Yes   Pain Score 8    Pain Location Back            [facility] PT Assessment - 07/23/15 1258    Strength   Overall Strength --  PF R 3 reps with instability UE on wall, DF R 4/5, DF L 5/5    Palpation   SI assessment  no pain at R PSIS, increased  tenderness and tension at medial/posterior ischial tuberosity and R adductors. pre-Tx: pt unable to perform Dynamic Stabilization Level 2, Post-Tx: pt unable to without pain or "pulling" able to perform Dynamic Stabilization level 2    Level 2 ex: includes hip abd ER   Palpation comment pre-Tx: radicalar Sx to toes, post-Tx w/ obturator releases: centralization of Sx to thigh/above knee level. Pt reported numbness only in her toes bilaterally w/ centralization of RLE radicular Sx                     [facility] Adult PT Treatment/Exercise - 07/23/15 1259    Manual Therapy   Myofascial Release Scar massage over lumbar scar and R adductors  Soft tissue mobilization (sustained pressure) MWM obturator releases in sidelying (medial/posterior isch                 PT Education - 07/23/15 1316    Education provided Yes   Education Details HEP   Person(s) Educated Patient   Methods Explanation;Demonstration;Tactile cues;Verbal cues;Handout   Comprehension Verbalized understanding;Returned demonstration  PT Long Term Goals - 07/23/15 1343    PT LONG TERM GOAL #1   Title Pt will decrease her score on ODI from 40% to < 25% in order to return ADLs.   Time 12   Period Weeks   Status On-going   PT LONG TERM GOAL #2   Title Pt will report being able to walk > .5 mile at a pain level < 4/10 in order to return to ADLs.   Time 12   Period Weeks   Status On-going   PT LONG TERM GOAL #3   Title Pt will demo decrease abdominal separation of <2 fingers with ability to lift foot off bed in hooklying to demo increase ability to withstand load and progress towards upright strengthening exercises.    Time 12   Period Weeks   Status Achieved   PT LONG TERM GOAL #4   Title Pt will report being able to tolerate sitting in a car for > 30 min in order to participate in community events.   Time 12   Period Weeks   Status On-going   PT LONG TERM GOAL #5   Title Pt will demo  decreased time with 5TST (22 sec to < 15 sec with UE support) and increased gait speed from 1.04 m/s to > 1.13 m/s in order to decrease risks for falls and cross streets.    Time 12   Period Weeks   Status On-going   Additional Long Term Goals   Additional Long Term Goals Yes   PT LONG TERM GOAL #6   Title Pt will report Stool Type 3-4 without medication/fiber supplements in order to demo improved pelvic floor function.    Time 12   Period Weeks   Status On-going   PT LONG TERM GOAL #7   Title Pt will demo BOS that matches the width of her hips with gait across 10 ft w/o cuing and increase PF reps on RLE from 3 reps to 10 reps with UE support on wall   in order to minimize relapse of pain and improve R LE weakness.    Time 12   Period Weeks   Status New               Plan - 07/23/15 1317    Clinical Impression Statement Pt showed good carry over w/ manual Tx from previous sessions (decreased tensions at R PSIS/lateral edge of sacrum and less abdominal separation of rectus abdominus). Today, pt  tolerated external manual Tx at medial ischial tuberosity area to release soft-tissue/fascial restrictions entrapping pudendal/obturator nn. Pt responded well with centralization of RLE Sx from feet to posterior thigh/above knee and increased ROM with hip abd/ER. However, pt reported she has noticed weakness in her R foot. This was an issue in the past and had resolved w/ prior PT but has started again since last visit.  Plan to address this issue at next session by retraining  myotomal pattern of S2. Pt also showed improved gait mechanics with training to increase BOS to decrease overuse of adductors during narrow BOS (her typical pattern).  Pt will continue to benefit from skilled PT.    Pt will benefit from skilled therapeutic intervention in order to improve on the following deficits Abnormal gait;Improper body mechanics;Pain;Increased fascial restricitons;Increased muscle spasms;Postural  dysfunction;Decreased mobility;Decreased coordination;Decreased scar mobility;Decreased safety awareness;Decreased balance;Decreased activity tolerance;Decreased endurance;Decreased range of motion;Decreased strength;Hypomobility;Difficulty walking;Impaired flexibility   Rehab Potential Good   PT Frequency 1x / week  PT Duration 8 weeks   PT Treatment/Interventions ADLs/Self Care Home Management;Aquatic Therapy;Cryotherapy;Electrical Stimulation;Biofeedback;Moist Heat;Traction;Gait training;Neuromuscular re-education;Balance training;Therapeutic exercise;Therapeutic activities;Functional mobility training;Stair training;Patient/family education;Scar mobilization;Manual techniques;Taping;Energy conservation;Dry needling;Other (comment)  pain science education   Consulted and Agree with Plan of Care Patient        Problem List Patient Active Problem List   Diagnosis Date Noted  . Routine general medical examination at a health care facility 02/28/2015  . Syncope and collapse 01/20/2015  . Encounter for Medicare annual wellness exam 02/06/2013  . Hyperlipidemia 08/27/2012  . Irregular heart beat 08/05/2012  . Gynecological examination 12/20/2010  . Screening for lipoid disorders 12/08/2010  . Eustachian tube dysfunction 11/29/2010  . BARRETTS ESOPHAGUS 10/01/2010  . Osteopenia 12/17/2009  . BACK PAIN, LUMBAR 07/02/2009  . IRRITABLE BOWEL SYNDROME 04/02/2009  . POSTMENOPAUSAL STATUS 12/10/2008  . Hypothyroidism 12/05/2007  . Meniere's disease 12/05/2007  . GERD 12/05/2007    Jerl Mina  ,PT, DPT, E-RYT  07/23/2015, 1:45 PM  [place] MAIN [facility] SERVICES [address] [place], [place], [address] Phone: [phone]   Fax:  [phone]  Name: Sarah Harper MRN: [id number] Date of Birth: 02-16-1941

## 2015-07-26 ENCOUNTER — Encounter: Payer: Medicare Other | Admitting: Physical Therapy

## 2015-07-27 ENCOUNTER — Ambulatory Visit: Payer: Medicare Other | Admitting: Physical Therapy

## 2015-07-27 DIAGNOSIS — R279 Unspecified lack of coordination: Secondary | ICD-10-CM

## 2015-07-27 DIAGNOSIS — M629 Disorder of muscle, unspecified: Secondary | ICD-10-CM

## 2015-07-28 NOTE — Patient Instructions (Signed)
Read "Why Do I Still Hurt?" and research on body scan for chronic pain   Practice proper body mechanics with household chores

## 2015-07-28 NOTE — Therapy (Signed)
[place] MAIN [facility] SERVICES [address] [place], [place], [address] Phone: [phone]   Fax:  [phone]  Physical Therapy Treatment  Patient Details  Name: Sarah Harper MRN: [id number] Date of Birth: 07-21-1941 Referring Provider: Angelene Giovanni   Encounter Date: 07/27/2015      PT End of Session - 07/28/15 0843    Visit Number 5   Number of Visits 12   Date for PT Re-Evaluation 09/06/15   Authorization Type g-code (10th visit)    PT Start Time 1000   PT Stop Time 1100   PT Time Calculation (min) 60 min   Activity Tolerance Patient tolerated treatment well;No increased pain   Behavior During Therapy [facility] for tasks assessed/performed      Past Medical History  Diagnosis Date  . GERD (gastroesophageal reflux disease)   . Thyroid disease     hypothyroid  . IBS (irritable bowel syndrome)   . ETD (eustachian tube dysfunction)   . Meniere's disease   . Migraine   . DDD (degenerative disc disease)     chronic back pain  . Osteoporosis     Past Surgical History  Procedure Laterality Date  . Lumbar disc surgery  1984    L5   . Appendectomy      There were no vitals filed for this visit.  Visit Diagnosis:  Fascial defect  Lack of coordination      Subjective Assessment - 07/27/15 0904    Subjective Pt reported since last session, pt has been able to perform hip ER/ abd to perform HEP with 15 reps compared 0 reps. However, 15 reps creates a burning sensation.  The burning lasts for a few hours and eases with laying down to relief pressure on it.  The burning sensation "is always there and never goes away".  After the maual Tx, pt noticed her Sx easded off for a few hours.  Today her pain is 6/10 with lidocain. Her concern is the  deep burning pain located at R PSIS with radiating pain (not numbness and tingling but a "fullness traveling down my leg that should not be there") that occurs mostly after walking > 20 min. Pt reported  numbness in her R foot with associated weakness that started after the past sessions and treatments. This Sx has been there for a long time before and had gotten better but has reoccurred. Pt reports she has to rest  2-3x/day between household chores (putting bed linens on bed and doing 2 loads of laundry) due to onset of  burning sensations.  Pt feels she needs to go rest as she feels it will help minimize the onset of the numbness in her foot . Pt feels frustrated with having to take multiple rest breaks.      Pertinent History Hx of scars: appendectomy and lumbar    Patient Stated Goals 1)  be able to walk > 0.5 mile at a tolerable level of pain at 4/10, 2) travel in a car > 20-30 min as driver and passenger             [facility] PT Assessment - 07/28/15 0821    Assessment   Medical Diagnosis LBP   Squat   Comments demo'd simulated task of putting on bed linens and taking cothes out of dryer w/ repeated trunk fllexion  reported less pain w/ semi tandem/front lunge stance   PROM   Overall PROM Comments R hip ext ~5 deg limited by radiating  pain    Palpation   SI assessment  SIJ symmetrical, no tensions over problem areas from previous sessions                      [facility] Adult PT Treatment/Exercise - 07/28/15 0827    Self-Care   Other Self-Care Comments  biopsychosocial approach for outcomes, education on neuroscience of pain and body scan., emphasized the importance of practicing body before focusing on strengthening R foot    Therapeutic Activites    ADL's modifications and body mechanics for putting bed linens on, unloading and hanging up clothes from dryer, and sweeping.    Neuro Re-ed    Neuro Re-ed Details  body scan 2 cycles : pt reported feeling relaxed afterwards , pelvic tilts w/ tactile cuing and explanation of antaomy and physiology of SIJ and neural tensions in trunk flexion                PT Education - 07/28/15 0838    Education provided Yes    Education Details HEP, neuroscience of pain   Person(s) Educated Patient   Methods Explanation;Demonstration;Tactile cues;Verbal cues;Handout   Comprehension Verbalized understanding;Returned demonstration             PT Long Term Goals - 07/27/15 0917    PT LONG TERM GOAL #1   Title (p) Pt will decrease her score on ODI from 40% to < 25% in order to return ADLs.   Time (p) 12   Period (p) Weeks   Status (p) On-going   PT LONG TERM GOAL #2   Title (p) Pt will report being able to walk > .5 mile at a pain level < 4/10 in order to return to ADLs.   Time (p) 12   Period (p) Weeks   Status (p) On-going   PT LONG TERM GOAL #3   Title (p) Pt will demo decrease abdominal separation of <2 fingers with ability to lift foot off bed in hooklying to demo increase ability to withstand load and progress towards upright strengthening exercises.    Time (p) 12   Period (p) Weeks   Status (p) Achieved   PT LONG TERM GOAL #4   Title (p) Pt will report being able to tolerate sitting in a car for > 30 min in order to participate in community events.   Time (p) 12   Period (p) Weeks   Status (p) On-going   PT LONG TERM GOAL #5   Title (p) Pt will demo decreased time with 5TST (22 sec to < 15 sec with UE support) and increased gait speed from 1.04 m/s to > 1.13 m/s in order to decrease risks for falls and cross streets.    Time (p) 12   Period (p) Weeks   Status (p) On-going   Additional Long Term Goals   Additional Long Term Goals (p) Yes   PT LONG TERM GOAL #6   Title (p) Pt will report Stool Type 3-4 without medication/fiber supplements in order to demo improved pelvic floor function.    Time (p) 12   Period (p) Weeks   Status (p) On-going   PT LONG TERM GOAL #7   Title (p) Pt will demo BOS that matches the width of her hips with gait across 10 ft w/o cuing and increase PF reps on RLE from 3 reps to 10 reps with UE support on wall   in order to minimize relapse of pain and improve R  LE  weakness.    Time (p) 12   Period (p) Weeks   Status (p) New   PT LONG TERM GOAL #8   Title (p) Pt will report a decrease in rest from 2-3x/day to 2x/day between performing  household chores (putting bed linens on and doing 2 loads of laundry) in order to show improved endurance and pain.    Time (p) 12   Period (p) Weeks   Status (p) New               Plan - 07/28/15 [id number]    Clinical Impression Statement Pt continues to show decreased mm tensions over problem areas from previous sessions. Pt reported decreased pain in her back with the body mechanics modifications to her household activities. Initiated body scan technique and reiterated neuroscience of pain today. Pt stated she felt "relaxed" afterwards. Plan is to initate strengthening of RLE/foot after pt has a relaxation practice in place given the chronicity of her pain, her past frustrations and negative experiences with prior PT exercises. Suspect body mechanics training will help pt understand ways to decrease neural tensions along sciatic nn. Pt will continue to benefit from skilled PT.      Pt will benefit from skilled therapeutic intervention in order to improve on the following deficits Abnormal gait;Improper body mechanics;Pain;Increased fascial restricitons;Increased muscle spasms;Postural dysfunction;Decreased mobility;Decreased coordination;Decreased scar mobility;Decreased safety awareness;Decreased balance;Decreased activity tolerance;Decreased endurance;Decreased range of motion;Decreased strength;Hypomobility;Difficulty walking;Impaired flexibility   Rehab Potential Good   PT Frequency 1x / week   PT Duration 8 weeks   PT Treatment/Interventions ADLs/Self Care Home Management;Aquatic Therapy;Cryotherapy;Electrical Stimulation;Biofeedback;Moist Heat;Traction;Gait training;Neuromuscular re-education;Balance training;Therapeutic exercise;Therapeutic activities;Functional mobility training;Stair training;Patient/family  education;Scar mobilization;Manual techniques;Taping;Energy conservation;Dry needling;Other (comment)   Consulted and Agree with Plan of Care Patient        Problem List Patient Active Problem List   Diagnosis Date Noted  . Routine general medical examination at a health care facility 02/28/2015  . Syncope and collapse 01/20/2015  . Encounter for Medicare annual wellness exam 02/06/2013  . Hyperlipidemia 08/27/2012  . Irregular heart beat 08/05/2012  . Gynecological examination 12/20/2010  . Screening for lipoid disorders 12/08/2010  . Eustachian tube dysfunction 11/29/2010  . BARRETTS ESOPHAGUS 10/01/2010  . Osteopenia 12/17/2009  . BACK PAIN, LUMBAR 07/02/2009  . IRRITABLE BOWEL SYNDROME 04/02/2009  . POSTMENOPAUSAL STATUS 12/10/2008  . Hypothyroidism 12/05/2007  . Meniere's disease 12/05/2007  . GERD 12/05/2007    Jerl Mina  ,PT, DPT, E-RYT  07/28/2015, 8:53 AM  [place] MAIN [facility] SERVICES [address] [place], [place], [address] Phone: [phone]   Fax:  [phone]  Name: HONORIA WINCHESTER MRN: [id number] Date of Birth: 1941/03/07

## 2015-07-29 ENCOUNTER — Encounter: Payer: Medicare Other | Admitting: Physical Therapy

## 2015-08-02 ENCOUNTER — Encounter: Payer: Medicare Other | Admitting: Physical Therapy

## 2015-08-05 ENCOUNTER — Ambulatory Visit (INDEPENDENT_AMBULATORY_CARE_PROVIDER_SITE_OTHER): Payer: Medicare Other | Admitting: Primary Care

## 2015-08-05 ENCOUNTER — Encounter: Payer: Self-pay | Admitting: Primary Care

## 2015-08-05 ENCOUNTER — Ambulatory Visit: Payer: Medicare Other | Admitting: Physical Therapy

## 2015-08-05 VITALS — BP 120/70 | HR 77 | Temp 98.0°F | Ht 66.0 in | Wt 138.0 lb

## 2015-08-05 DIAGNOSIS — R05 Cough: Secondary | ICD-10-CM | POA: Diagnosis not present

## 2015-08-05 DIAGNOSIS — R059 Cough, unspecified: Secondary | ICD-10-CM

## 2015-08-05 MED ORDER — AZITHROMYCIN 250 MG PO TABS: ORAL_TABLET | ORAL | Status: DC

## 2015-08-05 NOTE — Progress Notes (Signed)
Pre visit review using our clinic review tool, if applicable. No additional management support is needed unless otherwise documented below in the visit note. 

## 2015-08-05 NOTE — Progress Notes (Signed)
Subjective:    Patient ID: Sarah Harper, female    DOB: 1941-02-26, 74 y.o.   MRN: [id number]  HPI  Sarah Harper is a 74 year old female who presents today with a chief complaint of cough. She also reports symptoms of nasal congestion, sore throat, chest congestion, chills. Her cough is productive with thick/yellow mucous. Her symptoms have been present for the past 10 days. Last night she started to feel worse. She's taken Zycam, Nyquil, sudafed, and Robitussin DM with temporary improvement.   Review of Systems  Constitutional: Positive for chills and fatigue. Negative for fever.  HENT: Positive for congestion, rhinorrhea, sinus pressure and sore throat. Negative for ear pain.   Respiratory: Positive for cough. Negative for shortness of breath.   Cardiovascular: Negative for chest pain.  Gastrointestinal: Negative for nausea.  Musculoskeletal: Negative for myalgias.       Past Medical History  Diagnosis Date  . GERD (gastroesophageal reflux disease)   . Thyroid disease     hypothyroid  . IBS (irritable bowel syndrome)   . ETD (eustachian tube dysfunction)   . Meniere's disease   . Migraine   . DDD (degenerative disc disease)     chronic back pain  . Osteoporosis     Social History   Social History  . Marital Status: Married    Spouse Name: N/A  . Number of Children: 0  . Years of Education: N/A   Occupational History  . Retired    Social History Main Topics  . Smoking status: Never Smoker   . Smokeless tobacco: Not on file  . Alcohol Use: 0.6 oz/week    1 Glasses of wine per week     Comment: wine occasional  . Drug Use: No  . Sexual Activity: Not on file   Other Topics Concern  . Not on file   Social History Narrative    Past Surgical History  Procedure Laterality Date  . Lumbar disc surgery  1984    L5   . Appendectomy      Family History  Problem Relation Age of Onset  . Diabetes Paternal Aunt   . Hypertension Maternal Grandmother   .  Hypertension Maternal Grandfather     Allergies  Allergen Reactions  . Epinephrine     Current Outpatient Prescriptions on File Prior to Visit  Medication Sig Dispense Refill  . b complex vitamins tablet Take 1 tablet by mouth daily. 30 tablet 0  . butalbital-acetaminophen-caffeine (FIORICET) 50-325-40 MG per tablet Take 1 tablet by mouth every 6 (six) hours as needed. 90 tablet 3  . Calcium Carb-Cholecalciferol (CALCIUM 600 + D PO) Take 1 tablet by mouth daily.    . Cholecalciferol (VITAMIN D) 1000 UNITS capsule Take 1,000 Units by mouth daily.      Marland Kitchen dexlansoprazole (DEXILANT) 60 MG capsule Take 1 capsule (60 mg total) by mouth daily. 90 capsule 3  . fluticasone (FLONASE) 50 MCG/ACT nasal spray Place 2 sprays into the nose as needed.     Marland Kitchen levothyroxine (SYNTHROID, LEVOTHROID) 88 MCG tablet Take 1 tablet (88 mcg total) by mouth daily before breakfast. 90 tablet 3  . Linaclotide (LINZESS) 145 MCG CAPS capsule Take 145 mcg by mouth daily.    Marland Kitchen PREMARIN vaginal cream APPLY SMALL AMOUNT VAGINALLY 2 TO 3 TIMES A WEEK 90 g 1  . pseudoephedrine (SUDAFED) 60 MG tablet Take 60 mg by mouth daily.     Marland Kitchen lidocaine (XYLOCAINE) 5 % ointment Apply 1 application  topically as needed. (Patient not taking: Reported on 08/05/2015) 150 g 3  . meloxicam (MOBIC) 7.5 MG tablet Take 1 tablet (7.5 mg total) by mouth daily as needed for pain. With a meal (Patient not taking: Reported on 06/28/2015) 30 tablet 0  . traMADol (ULTRAM) 50 MG tablet Take 50 mg by mouth as needed for pain. Reported on 08/05/2015     No current facility-administered medications on file prior to visit.    BP 120/70 mmHg  Pulse 77  Temp(Src) 98 F (36.7 C) (Oral)  Ht 5\' 6"  (1.676 m)  Wt 138 lb (62.596 kg)  BMI 22.28 kg/m2  SpO2 98%    Objective:   Physical Exam  Constitutional: She appears well-nourished.  HENT:  Right Ear: Ear canal normal.  Left Ear: Ear canal normal.  Nose: Right sinus exhibits no maxillary sinus  tenderness and no frontal sinus tenderness. Left sinus exhibits no maxillary sinus tenderness and no frontal sinus tenderness.  Mouth/Throat: Oropharynx is clear and moist.  Eyes: Conjunctivae are normal. Pupils are equal, round, and reactive to light.  Neck: Neck supple.  Cardiovascular: Normal rate and regular rhythm.   Pulmonary/Chest: Effort normal and breath sounds normal. She has no wheezes. She has no rales.  Lymphadenopathy:    She has no cervical adenopathy.  Skin: Skin is warm and dry.          Assessment & Plan:  URI:   Cough, chest congestion, sore throat, rhinorrhea x 10 days. Now cough productive with yellow sputum and feeling worse. Exam mostly unremarkable, lungs clear without diminished sounds. Due to duration, production of cough, and now feeling worse will treat. RX for Zpak provided. Continue Robitussin DM. Flonase as needed. Increase fluids and rest. Discussed the probability of post bacterial cough. Follow up PRN.

## 2015-08-05 NOTE — Patient Instructions (Signed)
Start Azithromycin antibiotics. Take 2 tablets by mouth today, then 1 tablet daily for 4 additional days.  Continue Robitussin DM for cough and congestion.  Nasal congestion: Flonase (fluticasone) nasal spray. Instill 2 sprays in each nostril once daily.  Increase consumption of water to stay hydrated.  Please call me if no improvement in the next 3-4 days.  It was a pleasure to see you today!

## 2015-08-09 ENCOUNTER — Encounter: Payer: Self-pay | Admitting: Podiatry

## 2015-08-09 ENCOUNTER — Ambulatory Visit (INDEPENDENT_AMBULATORY_CARE_PROVIDER_SITE_OTHER): Payer: Medicare Other | Admitting: Podiatry

## 2015-08-09 ENCOUNTER — Encounter: Payer: Medicare Other | Admitting: Physical Therapy

## 2015-08-09 DIAGNOSIS — Q828 Other specified congenital malformations of skin: Secondary | ICD-10-CM

## 2015-08-09 NOTE — Progress Notes (Signed)
She presents today for follow-up of pain to the forefoot left. She states that my callus grew back almost his suicide at home last time.  Objective: Vital signs are stable she is alert and oriented 3. Pulses are strongly palpable. Porokeratotic lesion subsecond metatarsophalangeal joint of the left foot.  Assessment: Porokeratosis subsecond left.  Plan: Chemical debridement of the wound today after manual debridement. Placed salicylic acid under occlusion to be washed off in 3 days thoroughly. I will follow with her in 6 weeks if necessary.

## 2015-08-10 ENCOUNTER — Encounter: Payer: Medicare Other | Admitting: Physical Therapy

## 2015-08-12 ENCOUNTER — Ambulatory Visit: Payer: Medicare Other | Admitting: Physical Therapy

## 2015-08-12 DIAGNOSIS — M629 Disorder of muscle, unspecified: Secondary | ICD-10-CM | POA: Diagnosis not present

## 2015-08-12 DIAGNOSIS — R279 Unspecified lack of coordination: Secondary | ICD-10-CM

## 2015-08-13 NOTE — Therapy (Addendum)
[place] MAIN [facility] SERVICES [address] [place], [place], [address] Phone: [phone]   Fax:  [phone]  Physical Therapy Treatment/ Progress Note  Patient Details  Name: Sarah Harper MRN: [id number] Date of Birth: Dec 19, 1940 Referring Provider: Angelene Giovanni   Encounter Date: 08/12/2015    Past Medical History  Diagnosis Date  . GERD (gastroesophageal reflux disease)   . Thyroid disease     hypothyroid  . IBS (irritable bowel syndrome)   . ETD (eustachian tube dysfunction)   . Meniere's disease   . Migraine   . DDD (degenerative disc disease)     chronic back pain  . Osteoporosis     Past Surgical History  Procedure Laterality Date  . Lumbar disc surgery  1984    L5   . Appendectomy      There were no vitals filed for this visit.  Visit Diagnosis:  Fascial defect  Lack of coordination      Subjective Assessment - 08/16/15 2221    Subjective Pt reported her weakness in her R foot has started after the first Tx which occurred 70% of the time with walking. After last session, this weakness decreased by 10% of the time. Yesterday pt walked .5 mile which is the longest distance she has been able to do since [facility]. She did not experience pain while walking which is an improvement compared to the past when she had to stop walking 2/2 pain  and was not able to complete the full .5 distance.  Pt did experience radiating pain down her R leg to  foot the night following the walk and she woke up noticing her foot became weak again. Pt felt frustrated about the relapse of Sx. Pt practiced the body scan consistently and has been able to achieve a relaxed state most of the time but  with the relapse of her Sx yesterday, pt was not able to feel relaxed due to feeling "annoyed". Pt reports she feel better using body mechanics techniques when changing bed linens and unloading/hanging laundry.  Pt reports she has been able to progress towards 30  reps of level two of deep core exercises for one time. She can consistently perform 20 reps regularly which is an improvement from 15 reps that she was limited to last week.    Pertinent History Hx of scars: appendectomy and lumbar    Patient Stated Goals 1)  be able to walk > 0.5 mile at a tolerable level of pain at 4/10, 2) travel in a car > 20-30 min as driver and passenger             [facility] PT Assessment - 08/16/15 2221    Sensation   Light Touch Appears Intact  BLE    Coordination   Gross Motor Movements are Fluid and Coordinated --  cued for exhale, elicited LE 5/5 bil, no cue R 4/5    AROM   Overall AROM Comments Hip ext in standing R 15 deg, L 20 deg with poor motor pattern involving hip ext w/ increased lumbar lordosis, poor dissociation from lumbar spine/ SIJ     forward flexion: severe hypomobility of all lumbar segments    Strength   Overall Strength Other (comment)  Pre-Tx:PF R (UE on wall) 2 R, L > 10 reps, Post-Tx: R 3 reps   Palpation   SI assessment  SIJ symmetrical, no tensions over problem areas from previous sessions    Palpation comment pre-Tx: palpation at  R paraspinals (increased mm tensions) at T10-L1 w/ radicular Sx to foot, post-Tx: decreased mm paraspinals elicited centralization of radicular Sx to R PSIS    Ambulation/Gait   Gait velocity pre-Tx: 1.05 m/s, post-Tx: 1.09 m/s    Gait Comments limited hip ext                      [facility] PT Treatment/Exercise - 08/16/15 2221    Self-Care   Other Self-Care Comments  biopsychosocial approach, encouraged with self-management, appliciation of body scan consistently, role of nn system with hypeactivity of parasinals with stress. Continue to practice the deep core stabilization exercises.    Therapeutic Activites    Other Therapeutic Activities walking mechanics with neuro-reedu    Neuro Re-ed    Neuro Re-ed Details  hip ext dissociated from lumbar lordosis, alignment for HEP w/ posterior tilt  of pelvis     Exercises   Other Exercises  hip ext forward/backward ~5-10 deg w/ socks and no lumbar extension 10 reps, standing downdog by counter 5 breaths, supported bridge w/ yoga block under sacrum for posterior tilt/ decreased lumbar lordosis 5 min    Manual Therapy   Myofascial Release R paraspinals sidelying    Manual Traction w/ strap at L5/S  grade II 90 sec bouts 5 x    pt rpeorted released of back mm                 PT Education - 08/16/15 2258    Education provided Yes   Education Details HEP, prioception/ motor control training to decrease overactivity of paraspinal mm R > L and to increase ability to dissociate lumbar extension from hip ext    Person(s) Educated Patient   Methods Explanation;Demonstration;Tactile cues;Verbal cues;Handout   Comprehension Verbalized understanding;Returned demonstration             PT Long Term Goals - 08/16/15 2304    PT LONG TERM GOAL #1   Title Pt will decrease her score on ODI from 40% to < 25% in order to return ADLs.   Time 12   Period Weeks   Status On-going   PT LONG TERM GOAL #2   Title Pt will report being able to walk > .5 mile at a pain level < 4/10 in order to return to ADLs.   Time 12   Period Weeks   Status Achieved   PT LONG TERM GOAL #3   Title Pt will demo decrease abdominal separation of <2 fingers with ability to lift foot off bed in hooklying to demo increase ability to withstand load and progress towards upright strengthening exercises.    Time 12   Period Weeks   Status Achieved   PT LONG TERM GOAL #4   Title Pt will report being able to tolerate sitting in a car for > 30 min in order to participate in community events.   Time 12   Period Weeks   Status On-going   PT LONG TERM GOAL #5   Title Pt will demo decreased time with 5TST (22 sec to < 15 sec with UE support) and increased gait speed from 1.04 m/s to > 1.13 m/s in order to decrease risks for falls and cross streets.    Time 12   Period  Weeks   Status On-going   Additional Long Term Goals   Additional Long Term Goals Yes   PT LONG TERM GOAL #6   Title Pt will report Stool Type  3-4 without medication/fiber supplements in order to demo improved pelvic floor function.    Time 12   Period Weeks   Status On-going   PT LONG TERM GOAL #7   Title Pt will demo BOS that matches the width of her hips with gait across 10 ft w/o cuing and increase PF reps on RLE from 3 reps to 10 reps with UE support on wall   in order to minimize relapse of pain and improve R LE weakness.    Time 12   Period Weeks   Status New   PT LONG TERM GOAL #8   Title Pt will report a decrease in rest from 2-3x/day to 2x/day between performing  household chores (putting bed linens on and doing 2 loads of laundry) in order to show improved endurance and pain.    Time 12   Period Weeks   Status Achieved   PT LONG TERM GOAL  #9   TITLE Pt will demo increased hip ext on RLE to 15 deg without lumbar extension in order to walk without radicular pain at long distances.    Time 12   Period Weeks   Status New   PT LONG TERM GOAL  #10   TITLE Pt wil demo decreased paraspinal mm on R across 2 visits in order to demo better management of lumbar mm to decrease radicular pain.    Time 12   Period Weeks   Status New               Plan - 08/16/15 2307    Clinical Impression Statement Pt has achieved 3/8 goals with decreased abdominal separation, increased ability to walk .5 mi with decreased pain, and decreased rest breaks between ADLs. Two new goals have been added in order to address her remaining deficits related to decreased R hip ext AROM, decreased R plantarflexion strength, and hyperactivity of her lumbar  paraspinals mm and inability to dissociate between spinal and pelvic girdle movments in hip extensions. In previous sessions, pt responded well to treatments which improved her diastasis recti, pelvic girdle instability,mm tension imbalances, and  radiculopathy. Pt continues to report centralization of radicular Sx following Tx.   She no longer demonstrates SIJ asymmetries nor mm tensions near that area compared to [facility]. Today she showed increased mm tension in her paraspinals R > L which was found to reproduce her RLE  radicular Rx today. Post-Tx, pt showed increased AROM for R hip ext in standing without radicular Sx, slightly increased gait speed, and slightly increased plantarflexion strength. She also showed slightly improved motor control to dissociate between hip ext and lumbar extension. Pt would continue to benefit from skilled PT with use of a biopsychosocial approach in order to help her achieve her remaining goals.         Pt will benefit from skilled therapeutic intervention in order to improve on the following deficits Abnormal gait;Improper body mechanics;Pain;Increased fascial restricitons;Increased muscle spasms;Postural dysfunction;Decreased mobility;Decreased coordination;Decreased scar mobility;Decreased safety awareness;Decreased balance;Decreased activity tolerance;Decreased endurance;Decreased range of motion;Decreased strength;Hypomobility;Difficulty walking;Impaired flexibility   Rehab Potential Good   PT Frequency 1x / week   PT Duration 8 weeks   PT Treatment/Interventions ADLs/Self Care Home Management;Aquatic Therapy;Cryotherapy;Electrical Stimulation;Biofeedback;Moist Heat;Traction;Gait training;Neuromuscular re-education;Balance training;Therapeutic exercise;Therapeutic activities;Functional mobility training;Stair training;Patient/family education;Scar mobilization;Manual techniques;Taping;Energy conservation;Dry needling;Other (comment)   Consulted and Agree with Plan of Care Patient        Problem List Patient Active Problem List   Diagnosis Date Noted  . Routine general medical examination  at a health care facility 02/28/2015  . Syncope and collapse 01/20/2015  . Encounter for Medicare annual wellness exam  02/06/2013  . Hyperlipidemia 08/27/2012  . Irregular heart beat 08/05/2012  . Gynecological examination 12/20/2010  . Screening for lipoid disorders 12/08/2010  . Eustachian tube dysfunction 11/29/2010  . BARRETTS ESOPHAGUS 10/01/2010  . Osteopenia 12/17/2009  . BACK PAIN, LUMBAR 07/02/2009  . IRRITABLE BOWEL SYNDROME 04/02/2009  . POSTMENOPAUSAL STATUS 12/10/2008  . Hypothyroidism 12/05/2007  . Meniere's disease 12/05/2007  . GERD 12/05/2007    Jerl Mina ,PT, DPT, E-RYT  08/16/2015, 11:24 PM  [place] MAIN [facility] SERVICES [address] [place], [place], [address] Phone: [phone]   Fax:  [phone]  Name: Sarah Harper MRN: [id number] Date of Birth: October 22, 1940

## 2015-08-16 NOTE — Addendum Note (Signed)
Addended by: Jerl Mina on: 08/16/2015 11:36 PM   Modules accepted: Orders

## 2015-08-26 ENCOUNTER — Ambulatory Visit: Payer: Medicare Other | Attending: Physical Medicine & Rehabilitation | Admitting: Physical Therapy

## 2015-08-26 DIAGNOSIS — M629 Disorder of muscle, unspecified: Secondary | ICD-10-CM | POA: Insufficient documentation

## 2015-08-26 DIAGNOSIS — R279 Unspecified lack of coordination: Secondary | ICD-10-CM | POA: Insufficient documentation

## 2015-08-26 DIAGNOSIS — M79605 Pain in left leg: Secondary | ICD-10-CM | POA: Diagnosis present

## 2015-08-26 NOTE — Patient Instructions (Signed)
1. Cross over thigh stretch with belt under thighs  5 times before deep core level 2 knee out exercise to minimize burning sensation at R PSIS    2. Deep core ex (knee out level 2) see if you can get to 10 x, stretch 5 x, (repeated to get 30 reps)   3. Deep core ex (marching) activate pelvic floor with exhaling and not let pelvis rock.  Total 30 reps.   4. Pelvic tilt:  tuck under . Anterior (spilling forward)  and posterior tilt (tuck under) .     Pelvic swing side to side.   **(minute movement)

## 2015-08-27 NOTE — Therapy (Addendum)
[place] MAIN [facility] SERVICES [address] [place], [place], [address] Phone: [phone]   Fax:  [phone]  Physical Therapy Treatment  Patient Details  Name: Sarah Harper MRN: [id number] Date of Birth: 1941-02-20 Referring Provider: Angelene Giovanni   Encounter Date: 08/26/2015      PT End of Session - 08/27/15 1929    Visit Number 7   Number of Visits 12   Date for PT Re-Evaluation 09/06/15   Authorization Type g-code (10th visit)    PT Start Time 1000   PT Stop Time 1100   PT Time Calculation (min) 60 min   Activity Tolerance Patient tolerated treatment well;No increased pain   Behavior During Therapy [facility] for tasks assessed/performed      Past Medical History  Diagnosis Date  . GERD (gastroesophageal reflux disease)   . Thyroid disease     hypothyroid  . IBS (irritable bowel syndrome)   . ETD (eustachian tube dysfunction)   . Meniere's disease   . Migraine   . DDD (degenerative disc disease)     chronic back pain  . Osteoporosis     Past Surgical History  Procedure Laterality Date  . Lumbar disc surgery  1984    L5   . Appendectomy      There were no vitals filed for this visit.  Visit Diagnosis:  Fascial defect  Lack of coordination          [facility] PT Assessment - 08/27/15 1917    Observation/Other Assessments   Observations improved ability to dissociate between lumbar and pelvic girdle    Coordination   Gross Motor Movements are Fluid and Coordinated --  required tactile cuing for posterior tilt    AROM   Overall AROM Comments standing hip ext 20 deg bilaterally    Palpation   Spinal mobility significantly decreased paraspinal mm tensions bilaterally and decreased thoracic hypomobility    Palpation comment no SIJ asymmetries                      [facility] Adult PT Treatment/Exercise - 08/27/15 1921    Self-Care   Other Self-Care Comments  reviewed previous HEP, modified to one block under  sacrum for tparaspinal release   Neuro Re-ed    Neuro Re-ed Details  see pt instruction   Exercises   Other Exercises  see pt instructions                     PT Long Term Goals - 08/26/15 1102    PT LONG TERM GOAL #1   Title Pt will decrease her score on ODI from 40% to < 25% in order to return ADLs.   Time 12   Period Weeks   Status On-going   PT LONG TERM GOAL #2   Title Pt will report being able to walk > .5 mile at a pain level < 4/10 in order to return to ADLs.   Time 12   Period Weeks   Status Achieved   PT LONG TERM GOAL #3   Title Pt will demo decrease abdominal separation of <2 fingers with ability to lift foot off bed in hooklying to demo increase ability to withstand load and progress towards upright strengthening exercises.    Time 12   Period Weeks   Status Achieved   PT LONG TERM GOAL #4   Title Pt will report being able to tolerate sitting in a car for >  30 min in order to participate in community events.   Time 12   Period Weeks   Status Achieved   PT LONG TERM GOAL #5   Title Pt will demo decreased time with 5TST (22 sec to < 15 sec with UE support) and increased gait speed from 1.04 m/s to > 1.13 m/s in order to decrease risks for falls and cross streets.    Time 12   Period Weeks   Status On-going   Additional Long Term Goals   Additional Long Term Goals Yes   PT LONG TERM GOAL #6   Title Pt will report Stool Type 3-4 without medication/fiber supplements in order to demo improved pelvic floor function.    Time 12   Period Weeks   Status Deferred   PT LONG TERM GOAL #7   Title Pt will demo BOS that matches the width of her hips with gait across 10 ft w/o cuing and increase PF reps on RLE from 3 reps to 10 reps with UE support on wall   in order to minimize relapse of pain and improve R LE weakness.    Time 12   Period Weeks   Status Achieved   PT LONG TERM GOAL #8   Title Pt will report a decrease in rest from 2-3x/day to 2x/day between  performing  household chores (putting bed linens on and doing 2 loads of laundry) in order to show improved endurance and pain.    Time 12   Period Weeks   Status Achieved   PT LONG TERM GOAL  #9   TITLE Pt will demo increased hip ext on RLE to 15 deg without lumbar extension in order to walk without radicular pain at long distances.    Time 12   Period Weeks   Status Achieved   PT LONG TERM GOAL  #10   TITLE Pt wil demo decreased paraspinal mm on R across 2 visits in order to demo better management of lumbar mm to decrease radicular pain.    Time 12   Period Weeks   Status Achieved   PT LONG TERM GOAL  #11   TITLE Pt will report no burning sesnsation after burnign level 2 exercise for 30 reps in order to prepare for longer distance walking.   Time 12   Period Weeks   Status New   PT LONG TERM GOAL  #12   TITLE Pt will be able to walk .5 miles without pain during and after across 3 days in order to return to her PLOF.    Time 12   Period Weeks   Status New               Plan - 08/27/15 1931    Clinical Impression Statement  Pt is continuing to show progress with decreased paraspinal mm, improved ability to to dissociate between lumbar spine and pelvis with hip extension. and increased R hip ROM. Added a stretch to increase mobility of R SIJ joint in order to address her c/o burning sensation after walking and performing LEVEL 2 deep core ex (hip ER). Progressed pt to LEVEL 3 deep core for HEP after pt showed proper coordination with less lumbopelvic instability with moderate cuing.  Pt 's demeanor was more cheerful, less frustrated, and shared more laughter than at [facility]. Pt required less biopsychoscial reinforcement compared to less session.  Anticipate pt will progress towards remaining goals.     Pt will benefit from skilled therapeutic intervention in  order to improve on the following deficits Abnormal gait;Improper body mechanics;Pain;Increased fascial restricitons;Increased  muscle spasms;Postural dysfunction;Decreased mobility;Decreased coordination;Decreased scar mobility;Decreased safety awareness;Decreased balance;Decreased activity tolerance;Decreased endurance;Decreased range of motion;Decreased strength;Hypomobility;Difficulty walking;Impaired flexibility   Rehab Potential Good   PT Frequency 2x / week   PT Duration 12 weeks   PT Treatment/Interventions ADLs/Self Care Home Management;Aquatic Therapy;Cryotherapy;Electrical Stimulation;Biofeedback;Moist Heat;Traction;Gait training;Neuromuscular re-education;Balance training;Therapeutic exercise;Therapeutic activities;Functional mobility training;Stair training;Patient/family education;Scar mobilization;Manual techniques;Taping;Energy conservation;Dry needling;Other (comment)   Consulted and Agree with Plan of Care Patient        Problem List Patient Active Problem List   Diagnosis Date Noted  . Routine general medical examination at a health care facility 02/28/2015  . Syncope and collapse 01/20/2015  . Encounter for Medicare annual wellness exam 02/06/2013  . Hyperlipidemia 08/27/2012  . Irregular heart beat 08/05/2012  . Gynecological examination 12/20/2010  . Screening for lipoid disorders 12/08/2010  . Eustachian tube dysfunction 11/29/2010  . BARRETTS ESOPHAGUS 10/01/2010  . Osteopenia 12/17/2009  . BACK PAIN, LUMBAR 07/02/2009  . IRRITABLE BOWEL SYNDROME 04/02/2009  . POSTMENOPAUSAL STATUS 12/10/2008  . Hypothyroidism 12/05/2007  . Meniere's disease 12/05/2007  . GERD 12/05/2007    Jerl Mina ,PT, DPT, E-RYT  08/27/2015, 7:41 PM  [place] MAIN [facility] SERVICES [address] [place], [place], [address] Phone: [phone]   Fax:  [phone]  Name: Sarah Harper MRN: [id number] Date of Birth: December 12, 1940

## 2015-09-02 ENCOUNTER — Ambulatory Visit: Payer: Medicare Other | Admitting: Physical Therapy

## 2015-09-02 DIAGNOSIS — M629 Disorder of muscle, unspecified: Secondary | ICD-10-CM | POA: Diagnosis not present

## 2015-09-02 DIAGNOSIS — R279 Unspecified lack of coordination: Secondary | ICD-10-CM

## 2015-09-02 NOTE — Patient Instructions (Signed)
  Discontinue deep core exercises.        PELVIC FLOOR / KEGEL EXERCISES   Pelvic floor/ Kegel exercises are used to strengthen the muscles in the base of your pelvis that are responsible for supporting your pelvic organs and preventing urine/feces leakage. Based on your therapist's recommendations, they can be performed while standing, sitting, or lying down.  Make yourself aware of this muscle group by using these cues:  Imagine you are in a crowded room and you feel the need to pass gas. Your response is to pull up and in at the rectum.  Close the rectum. Pull the muscles up inside your body,feeling your scrotum lifting as well . Feel the pelvic floor muscles lift as if you were walking into a cold lake.  Place your hand on top of your pubic bone. Tighten and draw in the muscles around the anal muscles without squeezing the buttock muscles.  Common Errors:  Breath holding: If you are holding your breath, you may be bearing down against your bladder instead of pulling it up. If you belly bulges up while you are squeezing, you are holding your breath. Be sure to breathe gently in and out while exercising. Counting out loud may help you avoid holding your breath.  Accessory muscle use: You should not see or feel other muscle movement when performing pelvic floor exercises. When done properly, no one can tell that you are performing the exercises. Keep the buttocks, belly and inner thighs relaxed.  Overdoing it: Your muscles can fatigue and stop working for you if you over-exercise. You may actually leak more or feel soreness at the lower abdomen or rectum.  YOUR HOME EXERCISE PROGRAM  LONG HOLDS: Position: on back  Inhale and then exhale. Then squeeze the muscle and count aloud for 10 seconds. Rest with three long breaths. (Be sure to let belly sink in with exhales and not push outward)  Perform 3 repetitions, 3 times/day    **ALSO SQUEEZE BEFORE YOUR SNEEZE, COUGH, LAUGH to  decrease downward pressure   **ALSO EXHALE BEFORE YOU RISE AGAINST GRAVITY (lifting, sit to stand, from squat to stand)

## 2015-09-02 NOTE — Therapy (Signed)
[place] MAIN [facility] SERVICES [address] [place], [place], [address] Phone: [phone]   Fax:  [phone]  Physical Therapy Treatment  Patient Details  Name: Sarah Harper MRN: [id number] Date of Birth: Oct 17, 1940 Referring Provider: Angelene Giovanni   Encounter Date: 09/02/2015      PT End of Session - 09/02/15 1120    Visit Number 8   Number of Visits 12   Date for PT Re-Evaluation 09/06/15   Authorization Type g-code (10th visit)    PT Start Time 1004   PT Stop Time 1110   PT Time Calculation (min) 66 min      Past Medical History  Diagnosis Date  . GERD (gastroesophageal reflux disease)   . Thyroid disease     hypothyroid  . IBS (irritable bowel syndrome)   . ETD (eustachian tube dysfunction)   . Meniere's disease   . Migraine   . DDD (degenerative disc disease)     chronic back pain  . Osteoporosis     Past Surgical History  Procedure Laterality Date  . Lumbar disc surgery  1984    L5   . Appendectomy      There were no vitals filed for this visit.  Visit Diagnosis:  Fascial defect  Lack of coordination      Subjective Assessment - 09/02/15 1008    Subjective Pt reported after last session, pt went to pick five items from the grocery store and the eye MD. Her pain was servere afterwards 8/10 with burning sensation at R PSIS and RLE radiating heel. Pt was not able to perform her heel raises. Pt has tried the stretches prior to deep core level 2 exercise but continued to feel burning sensations afterwards. Pt achieved 10 reps before the burning occurred.  Pt continues to do 50% less ofher usual household chores with assistance from her husband. Pt reports she still feels she is walking with a feeling that is more "fluid" and her back feels looser. Pt thinks the amount of activities tend to flare-up her Sx,     Pertinent History Hx of scars: appendectomy and lumbar    Patient Stated Goals 1)  be able to walk > 0.5 mile  at a tolerable level of pain at 4/10, 2) travel in a car > 20-30 min as driver and passenger             [facility] PT Assessment - 09/02/15 1104    Observation/Other Assessments   Observations hip flexion/ abd/ER  60 -90 deg with pain   deep core level 3: pain at 10 reps    Flexibility   Soft Tissue Assessment /Muscle Length --  sidelying  hip flexion, knee ext: no p!,    Palpation   Spinal mobility T10 increased tensions. tenderness at mm, and spinous process , decreased post _Tx                  Pelvic Floor Special Questions - 09/02/15 1056    Pelvic Floor Internal Exam pt consented verbally without contraindications   Exam Type Vaginal   Palpation increased mm tensions at iliococcgeus, decreased w/ Tx   Strength fair squeeze, definite lift   Strength # of reps 3   Strength # of seconds 10           [facility] Adult PT Treatment/Exercise - 09/02/15 1117    Self-Care   Other Self-Care Comments  body scan    Neuro Re-ed  Neuro Re-ed Details  pelvic floor strengthening    Exercises   Other Exercises  see pt intructions  10 side steps L, caused p! (withheld)    Manual Therapy   Joint Mobilization Grade II AP in hooklying T9-10 30 sec bouts   Soft tissue mobilization paraspinals at T10 level bilaterally   Internal Pelvic Floor R iliococcygeus                PT Education - 09/02/15 1120    Education provided Yes   Education Details HEP   Person(s) Educated Patient   Methods Explanation;Demonstration;Tactile cues;Verbal cues;Handout   Comprehension Verbalized understanding;Returned demonstration             PT Long Term Goals - 09/02/15 1012    PT LONG TERM GOAL #1   Title Pt will decrease her score on ODI from 40% to < 25% in order to return ADLs.   Time 12   Period Weeks   Status On-going   PT LONG TERM GOAL #2   Title Pt will report being able to walk > .5 mile at a pain level < 4/10 in order to return to ADLs.   Time 12   Period Weeks    Status Achieved   PT LONG TERM GOAL #3   Title Pt will demo decrease abdominal separation of <2 fingers with ability to lift foot off bed in hooklying to demo increase ability to withstand load and progress towards upright strengthening exercises.    Time 12   Period Weeks   Status Achieved   PT LONG TERM GOAL #4   Title Pt will report being able to tolerate sitting in a car for > 30 min in order to participate in community events.   Time 12   Period Weeks   Status Achieved   PT LONG TERM GOAL #5   Title Pt will demo decreased time with 5TST (22 sec to < 15 sec with UE support) and increased gait speed from 1.04 m/s to > 1.13 m/s in order to decrease risks for falls and cross streets.     Time 12   Period Weeks   Status On-going   PT LONG TERM GOAL #6   Title Pt will report Stool Type 3-4 without medication/fiber supplements in order to demo improved pelvic floor function.    Time 12   Period Weeks   Status Deferred   PT LONG TERM GOAL #7   Title Pt will demo BOS that matches the width of her hips with gait across 10 ft w/o cuing and increase PF reps on RLE from 3 reps to 10 reps with UE support on wall   in order to minimize relapse of pain and improve R LE weakness.    Time 12   Period Weeks   Status Achieved   PT LONG TERM GOAL #8   Title Pt will report a decrease in rest from 2-3x/day to 2x/day between performing  household chores (putting bed linens on and doing 2 loads of laundry) in order to show improved endurance and pain.    Time 12   Period Weeks   Status Achieved   PT LONG TERM GOAL  #9   TITLE Pt will demo increased hip ext on RLE to 15 deg without lumbar extension in order to walk without radicular pain at long distances.    Time 12   Period Weeks   Status Achieved   PT LONG TERM GOAL  #10  TITLE Pt wil demo decreased paraspinal mm on R across 2 visits in order to demo better management of lumbar mm to decrease radicular pain.    Time 12   Period Weeks   Status  Achieved   PT LONG TERM GOAL  #11   TITLE Pt will report no burning sesnsation after burnign level 2 exercise for 30 reps in order to prepare for longer distance walking.   Time 12   Period Weeks   Status New   PT LONG TERM GOAL  #12   TITLE Pt will be able to walk .5 miles without pain during and after across 3 days in order to return to her PLOF.    Time 12   Period Weeks   Status New               Plan - 09/02/15 1121    Clinical Impression Statement Pt has had a flare-up since the last session. Withholding deep core exercises and progressed to pelvic floor endurance exercises. Pt showed improved bladder position with proper coordination but required manual releases on R iliococcygeus mm to decrease tensions. Pt 's deep core coordination and stability have improved despite burning sensaiton that occurs with increased reps.  Pt showed decreased paraspinal  mm tensions except for an area localized to T10 level with hypomobility at this segment. Suspect pt will benefit from pelvic floor training due to low endurance.  Plan to continue to address deep core strengthening, increased trunk rotation w/ gait, and select lower reps with pelvic girdle movements to minimize flare-ups.    Pt will benefit from skilled therapeutic intervention in order to improve on the following deficits Abnormal gait;Improper body mechanics;Pain;Increased fascial restricitons;Increased muscle spasms;Postural dysfunction;Decreased mobility;Decreased coordination;Decreased scar mobility;Decreased safety awareness;Decreased balance;Decreased activity tolerance;Decreased endurance;Decreased range of motion;Decreased strength;Hypomobility;Difficulty walking;Impaired flexibility   Rehab Potential Good   PT Frequency 1x / week   PT Duration 8 weeks   PT Treatment/Interventions ADLs/Self Care Home Management;Aquatic Therapy;Cryotherapy;Electrical Stimulation;Biofeedback;Moist Heat;Traction;Gait training;Neuromuscular  re-education;Balance training;Therapeutic exercise;Therapeutic activities;Functional mobility training;Stair training;Patient/family education;Scar mobilization;Manual techniques;Taping;Energy conservation;Dry needling;Other (comment)   Consulted and Agree with Plan of Care Patient        Problem List Patient Active Problem List   Diagnosis Date Noted  . Routine general medical examination at a health care facility 02/28/2015  . Syncope and collapse 01/20/2015  . Encounter for Medicare annual wellness exam 02/06/2013  . Hyperlipidemia 08/27/2012  . Irregular heart beat 08/05/2012  . Gynecological examination 12/20/2010  . Screening for lipoid disorders 12/08/2010  . Eustachian tube dysfunction 11/29/2010  . BARRETTS ESOPHAGUS 10/01/2010  . Osteopenia 12/17/2009  . BACK PAIN, LUMBAR 07/02/2009  . IRRITABLE BOWEL SYNDROME 04/02/2009  . POSTMENOPAUSAL STATUS 12/10/2008  . Hypothyroidism 12/05/2007  . Meniere's disease 12/05/2007  . GERD 12/05/2007    Jerl Mina  ,PT, DPT, E-RYT  09/02/2015, 11:26 AM  [place] MAIN [facility] SERVICES [address] [place], [place], [address] Phone: [phone]   Fax:  [phone]  Name: AERICA DONICA MRN: [id number] Date of Birth: 25-Dec-1940

## 2015-09-09 ENCOUNTER — Ambulatory Visit: Payer: Medicare Other | Admitting: Physical Therapy

## 2015-09-09 DIAGNOSIS — M629 Disorder of muscle, unspecified: Secondary | ICD-10-CM

## 2015-09-09 DIAGNOSIS — R279 Unspecified lack of coordination: Secondary | ICD-10-CM

## 2015-09-10 NOTE — Therapy (Addendum)
[place] MAIN [facility] SERVICES [address] [place], [place], [address] Phone: [phone]   Fax:  [phone]  Physical Therapy Treatment  Patient Details  Name: Sarah Harper MRN: [id number] Date of Birth: Oct 08, 1940 Referring Provider: Angelene Giovanni   Encounter Date: 09/09/2015      PT End of Session - 09/13/15 0029    Visit Number 9   Number of Visits 12   Date for PT Re-Evaluation 09/06/15   Authorization Type g-code (10th visit)    PT Start Time 1000   PT Stop Time 1120   PT Time Calculation (min) 80 min   Activity Tolerance Patient tolerated treatment well;No increased pain   Behavior During Therapy [facility] for tasks assessed/performed      Past Medical History  Diagnosis Date  . GERD (gastroesophageal reflux disease)   . Thyroid disease     hypothyroid  . IBS (irritable bowel syndrome)   . ETD (eustachian tube dysfunction)   . Meniere's disease   . Migraine   . DDD (degenerative disc disease)     chronic back pain  . Osteoporosis     Past Surgical History  Procedure Laterality Date  . Lumbar disc surgery  1984    L5   . Appendectomy      There were no vitals filed for this visit.  Visit Diagnosis:  Fascial defect  Lack of coordination          [facility] PT Assessment - 09/13/15 0024    Observation/Other Assessments   Oswestry Disability Index  ODI 48%    Strength   Overall Strength --  heel raises R 3 (midrange) , L 10   Palpation   Spinal mobility mm tensions of paraspinaled returned   Palpation comment llimited diaphragmatic depression, breathholding with exhalation   Ambulation/Gait   Gait Comments increased pelvic rotation with increased stride                   Pelvic Floor Special Questions - 09/13/15 0022    Pelvic Floor Internal Exam pt consented verbally without contraindications   Exam Type Vaginal   Palpation delayed pelvic floor relaxation, adductor overuse with contraction,  breathholding on exhalation   Strength fair squeeze, definite lift           [facility] Adult PT Treatment/Exercise - 09/13/15 0025    Self-Care   Other Self-Care Comments  discussed next step in POC, collaborated with pt to find proper  ther ex Tx to faciliate flexibility HEP. Pt agreed to yoga after declining aquatic Tx   Neuro Re-ed    Neuro Re-ed Details  vocalization of sound to allow release of breath ( 3rd trial, able to perform correctly),  emphasizing relaxation of pelvic floor                      PT Long Term Goals - 09/09/15 1014    PT LONG TERM GOAL #1   Title Pt will decrease her score on ODI from 40% to < 25% in order to return ADLs.   Time 12   Period Weeks   Status On-going   PT LONG TERM GOAL #2   Title Pt will report being able to walk > .5 mile at a pain level < 4/10 in order to return to ADLs.   Time 12   Period Weeks   Status Achieved   PT LONG TERM GOAL #3   Title Pt will demo decrease abdominal  separation of <2 fingers with ability to lift foot off bed in hooklying to demo increase ability to withstand load and progress towards upright strengthening exercises.    Time 12   Period Weeks   Status Achieved   PT LONG TERM GOAL #4   Title Pt will report being able to tolerate sitting in a car for > 30 min in order to participate in community events.   Time 12   Period Weeks   Status Achieved   PT LONG TERM GOAL #5   Title Pt will demo decreased time with 5TST (22 sec to < 15 sec with UE support) and increased gait speed from 1.04 m/s to > 1.13 m/s in order to decrease risks for falls and cross streets.     Time 12   Period Weeks   Status On-going   PT LONG TERM GOAL #6   Title Pt will report Stool Type 3-4 without medication/fiber supplements in order to demo improved pelvic floor function.    Time 12   Period Weeks   Status Deferred   PT LONG TERM GOAL #7   Title Pt will demo BOS that matches the width of her hips with gait across 10 ft w/o  cuing and increase PF reps on RLE from 3 reps to 10 reps with UE support on wall   in order to minimize relapse of pain and improve R LE weakness.    Time 12   Period Weeks   Status Achieved   PT LONG TERM GOAL #8   Title Pt will report a decrease in rest from 2-3x/day to 2x/day between performing  household chores (putting bed linens on and doing 2 loads of laundry) in order to show improved endurance and pain.    Time 12   Period Weeks   Status Achieved   PT LONG TERM GOAL  #9   TITLE Pt will demo increased hip ext on RLE to 15 deg without lumbar extension in order to walk without radicular pain at long distances.    Time 12   Period Weeks   Status Achieved   PT LONG TERM GOAL  #10   TITLE Pt wil demo decreased paraspinal mm on R across 2 visits in order to demo better management of lumbar mm to decrease radicular pain.    Time 12   Period Weeks   Status Achieved   PT LONG TERM GOAL  #11   TITLE Pt will report no burning sesnsation after burnign level 2 exercise for 30 reps in order to prepare for longer distance walking.   Time 12   Period Weeks   Status New   PT LONG TERM GOAL  #12   TITLE Pt will be able to walk .5 miles without pain during and after across 3 days in order to return to her PLOF.    Time 12   Period Weeks   Status New               Plan - 09/13/15 0029    Clinical Impression Statement Pt has achieved 7/11 goals. Pt continues to show brighter demeanor and significantly improved SIJ arthokinematics with increased pelvic rotation during gait.   Increased mm tensions at paraspinal mm and pelvic floor mm returned which may explain pt's subjective report of feeling no improvement of her Sx.   Further assessment showed pt's inability to fully relax her diaphragm and pelvic floor.  Suspect pt would benefit from yoga therapy as pt needs  to continue to learn global relaxation of her body in upright movements. Through motivational interviewing, yoga  was the one mode  of exercise which she expressed having performed in the past and enjoyed very much until she stopped due to her back and knee issues. Pt has shown fluctuating improvements with plantarflexion strength without decline. Pt denied saddle sign nor any changes in bowel and bladder.  Plan to incorporate more yoga therapy into her HEP and neuromuscular training as an attempt to make further progress. Also, assess lumbar scar at next session in addition to pelvic floor for carry over of relaxation training. Requesting re-cert for 2x/week in order to incorporate one sessions towards manual Tx/ther ex/ neuro-reedu and the second session towards therapeutic ex to achieve IND w/ self-management of pain.   Pt will benefit from skilled therapeutic intervention in order to improve on the following deficits Abnormal gait;Improper body mechanics;Pain;Increased fascial restricitons;Increased muscle spasms;Postural dysfunction;Decreased mobility;Decreased coordination;Decreased scar mobility;Decreased safety awareness;Decreased balance;Decreased activity tolerance;Decreased endurance;Decreased range of motion;Decreased strength;Hypomobility;Difficulty walking;Impaired flexibility;Impaired perceived functional ability   Rehab Potential Good   PT Frequency 2x / week   PT Duration 12 weeks   PT Treatment/Interventions ADLs/Self Care Home Management;Aquatic Therapy;Cryotherapy;Electrical Stimulation;Biofeedback;Moist Heat;Traction;Gait training;Neuromuscular re-education;Balance training;Therapeutic exercise;Therapeutic activities;Functional mobility training;Stair training;Patient/family education;Scar mobilization;Manual techniques;Taping;Energy conservation;Dry needling;Other (comment)   Consulted and Agree with Plan of Care Patient        Problem List Patient Active Problem List   Diagnosis Date Noted  . Routine general medical examination at a health care facility 02/28/2015  . Syncope and collapse 01/20/2015  .  Encounter for Medicare annual wellness exam 02/06/2013  . Hyperlipidemia 08/27/2012  . Irregular heart beat 08/05/2012  . Gynecological examination 12/20/2010  . Screening for lipoid disorders 12/08/2010  . Eustachian tube dysfunction 11/29/2010  . BARRETTS ESOPHAGUS 10/01/2010  . Osteopenia 12/17/2009  . BACK PAIN, LUMBAR 07/02/2009  . IRRITABLE BOWEL SYNDROME 04/02/2009  . POSTMENOPAUSAL STATUS 12/10/2008  . Hypothyroidism 12/05/2007  . Meniere's disease 12/05/2007  . GERD 12/05/2007    Jerl Mina ,PT, DPT, E-RYT  09/13/2015, 12:42 AM  [place] MAIN [facility] SERVICES [address] [place], [place], [address] Phone: [phone]   Fax:  [phone]  Name: Sarah Harper MRN: [id number] Date of Birth: 09-24-1940

## 2015-09-16 ENCOUNTER — Ambulatory Visit: Payer: Medicare Other | Admitting: Physical Therapy

## 2015-09-16 ENCOUNTER — Encounter: Payer: Medicare Other | Admitting: Physical Therapy

## 2015-09-16 DIAGNOSIS — R279 Unspecified lack of coordination: Secondary | ICD-10-CM

## 2015-09-16 DIAGNOSIS — M629 Disorder of muscle, unspecified: Secondary | ICD-10-CM | POA: Diagnosis not present

## 2015-09-16 DIAGNOSIS — M79605 Pain in left leg: Secondary | ICD-10-CM

## 2015-09-17 NOTE — Addendum Note (Signed)
Addended by: Jerl Mina on: 09/17/2015 12:37 PM   Modules accepted: Orders

## 2015-09-17 NOTE — Patient Instructions (Signed)
Hand drawn picture of yoga sequences   Trap stretch with belt

## 2015-09-17 NOTE — Therapy (Addendum)
[place] MAIN [facility] SERVICES [address] [place], [place], [address] Phone: [phone]   Fax:  [phone]  Physical Therapy Treatment  Patient Details  Name: Sarah Harper MRN: [id number] Date of Birth: 06/10/1941 Referring Provider: Angelene Giovanni   Encounter Date: 09/16/2015      PT End of Session - 09/17/15 1143    Visit Number 10   Number of Visits 12   Date for PT Re-Evaluation    Authorization Type g-code (10th visit)    PT Start Time 1000   PT Stop Time 1100   PT Time Calculation (min) 60 min   Activity Tolerance Patient tolerated treatment well;No increased pain   Behavior During Therapy [facility] for tasks assessed/performed      Past Medical History  Diagnosis Date  . GERD (gastroesophageal reflux disease)   . Thyroid disease     hypothyroid  . IBS (irritable bowel syndrome)   . ETD (eustachian tube dysfunction)   . Meniere's disease   . Migraine   . DDD (degenerative disc disease)     chronic back pain  . Osteoporosis     Past Surgical History  Procedure Laterality Date  . Lumbar disc surgery  1984    L5   . Appendectomy      There were no vitals filed for this visit.  Visit Diagnosis:  Fascial defect - Plan: PT plan of care cert/re-cert  Lack of coordination - Plan: PT plan of care cert/re-cert  Pain of left lower extremity - Plan: PT plan of care cert/re-cert      Subjective Assessment - 09/16/15 0954    Subjective Pt reported feeling the radiating pain down to her R foot while walking but afterwards, the radiating pain moved up to her calf.     Pertinent History Hx of scars: appendectomy and lumbar    Patient Stated Goals 1)  be able to walk > 0.5 mile at a tolerable level of pain at 4/10, 2) travel in a car > 20-30 min as driver and passenger             [facility] PT Assessment - 09/17/15 1129    Observation/Other Assessments   Observations increased forward head at C6/7     Squat   Comments  radiating pain with ~609 deg trunk to hip angle, no radiating pain at ~30 deg in chair pose   Other:   Other/ Comments elongation of spine in modified down dog pose w/ strap at doorknob w/ radiating pain, no pain without strap    Other:   Other/Comments knees over 5" bolster in hooklying w/ radiating pain, no pain with pillow during svasana pose    Strength   Overall Strength --  4 reps of 4/5 plantarflexion on R     Palpation   Spinal mobility decreased paraspinal mm tension, increased trap mm tensions    SI assessment  SIJ symmetrical, no tensions over problem areas from previous sessions    Palpation comment increased edmea over medial aspect of L knee, applied kinesiotape to continue with yoga sequence and minimize swelling                      [facility] Adult PT Treatment/Exercise - 09/17/15 1135    Self-Care   Other Self-Care Comments  relaxation pose   towel over eyes, weights over hips    Neuro Re-ed    Neuro Re-ed Details  principles of stability with flexibility. relaxation during  yoga poses.    Exercises   Exercises --  trap stretch w/ belt    Other Exercises  modified down dog at counter, mountain pose, arms overhead w/ scapular retraction/depression, eagle arms / wrist /forearm twist, revoling twist in warrior stance, extended side angle, seated sidebend, seated warrior II w/ relaxed arm movements (not static) , pirifomis stretch,  w/ modifications for L knee    Manual Therapy   Soft tissue mobilization upper trap sustained releases   and traction bilaterally    Kinesiotix   Edema medial aspect of L knee                 PT Education - 09/17/15 1143    Education provided Yes   Education Details HEP   Person(s) Educated Patient   Methods Explanation;Demonstration;Tactile cues;Verbal cues;Handout   Comprehension Verbalized understanding;Returned demonstration             PT Long Term Goals - 09/17/15 1144    PT LONG TERM GOAL #1   Title Pt  will decrease her score on ODI from 40% to < 25% in order to return ADLs.   Time 12   Period Weeks   Status On-going   PT LONG TERM GOAL #2   Title Pt will report being able to walk > .5 mile at a pain level < 4/10 in order to return to ADLs.   Time 12   Period Weeks   Status Achieved   PT LONG TERM GOAL #3   Title Pt will demo decrease abdominal separation of <2 fingers with ability to lift foot off bed in hooklying to demo increase ability to withstand load and progress towards upright strengthening exercises.    Time 12   Period Weeks   Status Achieved   PT LONG TERM GOAL #4   Title Pt will report being able to tolerate sitting in a car for > 30 min in order to participate in community events.   Time 12   Period Weeks   Status Achieved   PT LONG TERM GOAL #5   Title Pt will demo decreased time with 5TST (22 sec to < 15 sec with UE support) and increased gait speed from 1.04 m/s to > 1.13 m/s in order to decrease risks for falls and cross streets.     Time 12   Period Weeks   Status On-going   Additional Long Term Goals   Additional Long Term Goals Yes   PT LONG TERM GOAL #6   Title Pt will report Stool Type 3-4 without medication/fiber supplements in order to demo improved pelvic floor function.    Time 12   Period Weeks   Status Deferred   PT LONG TERM GOAL #7   Title Pt will demo BOS that matches the width of her hips with gait across 10 ft w/o cuing.   Time 12   Period Weeks   Status Achieved   PT LONG TERM GOAL #8   Title Pt will report a decrease in rest from 2-3x/day to 2x/day between performing  household chores (putting bed linens on and doing 2 loads of laundry) in order to show improved endurance and pain.    Time 12   Period Weeks   Status Achieved   PT LONG TERM GOAL  #9   TITLE Pt will demo increased hip ext on RLE to 15 deg without lumbar extension in order to walk without radicular pain at long distances.    Time 12  Period Weeks   Status Achieved   PT  LONG TERM GOAL  #10   TITLE Pt wil demo decreased paraspinal mm on R across 2 visits in order to demo better management of lumbar mm to decrease radicular pain.    Time 12   Period Weeks   Status Achieved   PT LONG TERM GOAL  #11   TITLE Pt will report no burning sensation after burning level 2 exercise for 30 reps in order to prepare for longer distance walking.   Time 12   Period Weeks   Status New   PT LONG TERM GOAL  #12   TITLE Pt will be able to walk .5 miles without pain during and after across 3 days in order to return to her PLOF.    Time 12   Period Weeks   Status New   PT LONG TERM GOAL  #13   TITLE Pt will demo decreased forward head at C6/7 and will report no radiating pain with knees over bolsters after 5 min in order to demo less dural tensions and increase tolerance with greater hip/trunk angle for yoga routine.    Time 12   Period Weeks   Status New   PT LONG TERM GOAL  #14   TITLE Pt to increase PF reps on RLE from 3 reps to 7 reps with UE support on wall   in order to minimize relapse of pain and improve R LE weakness.    Time 12   Period Weeks   Status New   PT LONG TERM GOAL  #15   TITLE Pt will report no aggravation of Sx post-yoga routine HEP in order to return to yoga classes.    Time 12   Period Weeks   Status New               Plan - 10/14/2015 1148    Clinical Impression Statement Pt has achieved 7/11 goals with improved gait speed, increased hip swing/trunk rotation, increased hip extension, and stride during gait. Pt has responded well to manual Tx w/ restored SIJ arthrokinematics,  paraspinal mm flexibility, decreased lumbar lordosis, and improved diaphragma/TrA/ pelvic floor coordination.  Pt has been trained proper body mechanics with household activity to minimize strain on her back which pt has reported to be helpful. Given pt's level of frustration related to her pain, pt has been provided biopsychosocial approaches with use of body scan and  relaxation techniques. She will continue to need more biopsychoscial approaches in order to help pt maintain less body tensions w/ activities and to return to yoga classes which she "loved before her back and knee started hurting."   Will address pt's forward head posture to minimize dural tensions as another attempt to minimize radiating Sx. Withheld hip abd/ER last session which lead to gradual centralization of R radiating pain. Accessing and treating her L knee has been added to her re-cert as she presented w/ edema 2/2 arthritis and is likely contributing to imbalanced mechanics of the lower kinetic chain.  In addition, pt will be seen 2x a week to address her remaining goals. Three additional goals have been added. Thank you for your referral!            G-Codes - October 14, 2015 1147    Functional Assessment Tool Used ODI 48%   Functional Limitation Mobility: Walking and moving around   Mobility: Walking and Moving Around Current Status [id number]) At least 40 percent but less than 60 percent impaired,  limited or restricted   Mobility: Walking and Moving Around Goal Status [phone]) At least 40 percent but less than 60 percent impaired, limited or restricted      Problem List Patient Active Problem List   Diagnosis Date Noted  . Routine general medical examination at a health care facility 02/28/2015  . Syncope and collapse 01/20/2015  . Encounter for Medicare annual wellness exam 02/06/2013  . Hyperlipidemia 08/27/2012  . Irregular heart beat 08/05/2012  . Gynecological examination 12/20/2010  . Screening for lipoid disorders 12/08/2010  . Eustachian tube dysfunction 11/29/2010  . BARRETTS ESOPHAGUS 10/01/2010  . Osteopenia 12/17/2009  . BACK PAIN, LUMBAR 07/02/2009  . IRRITABLE BOWEL SYNDROME 04/02/2009  . POSTMENOPAUSAL STATUS 12/10/2008  . Hypothyroidism 12/05/2007  . Meniere's disease 12/05/2007  . GERD 12/05/2007    Jerl Mina ,PT, DPT, E-RYT  09/17/2015, 12:18 PM  [place] MAIN [facility] SERVICES [address] [place], [place], [address] Phone: [phone]   Fax:  [phone]  Name: SUMAYAH CACHU MRN: [id number] Date of Birth: 1941/02/03

## 2015-09-20 ENCOUNTER — Ambulatory Visit: Payer: Medicare Other | Admitting: Physical Therapy

## 2015-09-20 DIAGNOSIS — R279 Unspecified lack of coordination: Secondary | ICD-10-CM

## 2015-09-20 DIAGNOSIS — M629 Disorder of muscle, unspecified: Secondary | ICD-10-CM

## 2015-09-20 DIAGNOSIS — M79605 Pain in left leg: Secondary | ICD-10-CM

## 2015-09-20 NOTE — Addendum Note (Signed)
Addended by: Jerl Mina on: 09/20/2015 08:15 AM   Modules accepted: Orders

## 2015-09-20 NOTE — Addendum Note (Signed)
Addended by: Jerl Mina on: 09/20/2015 08:22 AM   Modules accepted: Orders

## 2015-09-21 NOTE — Therapy (Addendum)
[place] MAIN [facility] SERVICES [address] [place], [place], [address] Phone: [phone]   Fax:  [phone]  Physical Therapy Treatment  Patient Details  Name: Sarah Harper MRN: [id number] Date of Birth: 06-25-1941 Referring Provider: Angelene Giovanni   Encounter Date: 09/20/2015      PT End of Session - 09/21/15 1937    Visit Number 11   Number of Visits 12   Date for PT Re-Evaluation 11/04/15   Authorization Type g-code (10th visit)    PT Start Time 0900   PT Stop Time 1000   PT Time Calculation (min) 60 min   Activity Tolerance Patient tolerated treatment well;No increased pain   Behavior During Therapy [facility] for tasks assessed/performed      Past Medical History  Diagnosis Date  . GERD (gastroesophageal reflux disease)   . Thyroid disease     hypothyroid  . IBS (irritable bowel syndrome)   . ETD (eustachian tube dysfunction)   . Meniere's disease   . Migraine   . DDD (degenerative disc disease)     chronic back pain  . Osteoporosis     Past Surgical History  Procedure Laterality Date  . Lumbar disc surgery  1984    L5   . Appendectomy      There were no vitals filed for this visit.  Visit Diagnosis:  Fascial defect  Lack of coordination  Pain of left lower extremity      Subjective Assessment - 09/21/15 2004    Subjective Pt reported feeling "looser" in the hips and neck but later the neck spasms were experienced. The cat/cow caused radiating pain. Standing warrior poses caused L knee pain. Pt erormed only 50% of her customized yoga routine.    Pertinent History Hx of scars: appendectomy and lumbar    Patient Stated Goals 1)  be able to walk > 0.5 mile at a tolerable level of pain at 4/10, 2) travel in a car > 20-30 min as driver and passenger             [facility] PT Assessment - 09/21/15 1931    Observation/Other Assessments   Observations pt required cuing to not overstretching to minimize pulling along back  mm to R PSIS.    AROM   Overall AROM Comments supine rotation L (pre Tx: 30 deg ) (post-Tx 40 deg), R 60 deg 18 cm auditory meatus to acromium bil  forward head  neck sidebend seated (L 30, R 20  pre/ L 25 deg postTx )   Palpation   SI assessment  increased hypomobility at C7/T1. T3/4 w/ fascial tensions (decreased post-Tx)                       [facility] Adult PT Treatment/Exercise - 09/21/15 1933    Neuro Re-ed    Neuro Re-ed Details  upper trap stretch with belt, guided to find range before straining back mm  reviewed HEP w/ cues for scap and cervical retraction   Exercises   Other Exercises  neck ROM    Manual Therapy   Myofascial Release sacrum/ cervical    Manual Traction occipital release, C7/T1   distraction/AP Grade II in supine C7/T1, T3/4 30 sec x 1                       PT Long Term Goals - 09/21/15 2005    PT LONG TERM GOAL #1   Title Pt  will decrease her score on ODI from 40% to < 25% in order to return ADLs.   Time 12   Period Weeks   Status On-going   PT LONG TERM GOAL #2   Title Pt will report being able to walk > .5 mile at a pain level < 4/10 in order to return to ADLs.   Time 12   Period Weeks   Status Achieved   PT LONG TERM GOAL #3   Title Pt will demo decrease abdominal separation of <2 fingers with ability to lift foot off bed in hooklying to demo increase ability to withstand load and progress towards upright strengthening exercises.    Time 12   Period Weeks   Status Achieved   PT LONG TERM GOAL #4   Title Pt will report being able to tolerate sitting in a car for > 30 min in order to participate in community events.   Time 12   Period Weeks   Status Achieved   PT LONG TERM GOAL #5   Title Pt will demo decreased time with 5TST (22 sec to < 15 sec with UE support) and increased gait speed from 1.04 m/s to > 1.13 m/s in order to decrease risks for falls and cross streets.     Time 12   Period Weeks   Status On-going   PT  LONG TERM GOAL #6   Title Pt will report Stool Type 3-4 without medication/fiber supplements in order to demo improved pelvic floor function.    Time 12   Period Weeks   Status Deferred   PT LONG TERM GOAL #7   Title Pt will demo BOS that matches the width of her hips with gait across 10 ft w/o cuing.   Time 12   Period Weeks   Status Achieved   PT LONG TERM GOAL #8   Title Pt will report a decrease in rest from 2-3x/day to 2x/day between performing  household chores (putting bed linens on and doing 2 loads of laundry) in order to show improved endurance and pain.    Time 12   Period Weeks   Status Achieved   PT LONG TERM GOAL  #9   TITLE Pt will demo increased hip ext on RLE to 15 deg without lumbar extension in order to walk without radicular pain at long distances.    Time 12   Period Weeks   Status Achieved   PT LONG TERM GOAL  #10   TITLE Pt wil demo decreased paraspinal mm on R across 2 visits in order to demo better management of lumbar mm to decrease radicular pain.    Time 12   Period Weeks   Status Achieved   PT LONG TERM GOAL  #11   TITLE Pt will report no burning sesnsation after burnign level 2 exercise for 30 reps in order to prepare for longer distance walking.   Time 12   Period Weeks   Status New   PT LONG TERM GOAL  #12   TITLE Pt will be able to walk .5 miles without pain during and after across 3 days in order to return to her PLOF.    Time 12   Period Weeks   Status New   PT LONG TERM GOAL  #13   TITLE Pt will demo decreased forward head at C6/7 and will report no radiating pain with knees over bolsters after 5 min in order to demo less dural tensions and increase tolerance with  greater hip/trunk angle for yoga routine.    Time 12   Period Weeks   Status New   PT LONG TERM GOAL  #14   TITLE Pt to increase PF reps on RLE from 3 reps to 7 reps with UE support on wall   in order to minimize relapse of pain and improve R LE weakness.    Time 12   Period  Weeks   Status New   PT LONG TERM GOAL  #15   TITLE Pt will report no aggravation of Sx post-yoga routine HEP in order to return to yoga classes. (including cat/cow)    Time 12   Period Weeks   Status New               Plan - 09/21/15 2005    Clinical Impression Statement Pt reported "feeling better" after Tx. Pt showed increased cervical ROM and increased ability to perform cervical/ spacular retraction to correct forward head posture. Pt will continue to benefit from skilled PT to address a more balanced thoracolumbar fascia to SIJ. Anticipate decreasing pt's mm tension at upper traps and increasing  mobility at C/T junction of spine will faciliate increased fascial pliabilty to thoracolumbar fascia to minimize irritability of sciatic Sx. Pt continues to show good carry over with pelvic girdle symmetry and decreased paraspinal mm tensions.   Withheld cat cow and standing warrior poses due to pain. Modified standing warrior to seated position. Pt is progressing well towards her goals.   Pt will benefit from skilled therapeutic intervention in order to improve on the following deficits Abnormal gait;Improper body mechanics;Pain;Increased fascial restricitons;Increased muscle spasms;Postural dysfunction;Decreased mobility;Decreased coordination;Decreased scar mobility;Decreased safety awareness;Decreased balance;Decreased activity tolerance;Decreased endurance;Decreased range of motion;Decreased strength;Hypomobility;Difficulty walking;Impaired flexibility;Impaired perceived functional ability   Rehab Potential Good   PT Frequency 2x / week   PT Duration 12 weeks   PT Treatment/Interventions ADLs/Self Care Home Management;Aquatic Therapy;Cryotherapy;Electrical Stimulation;Biofeedback;Moist Heat;Traction;Gait training;Neuromuscular re-education;Balance training;Therapeutic exercise;Therapeutic activities;Functional mobility training;Stair training;Patient/family education;Scar  mobilization;Manual techniques;Taping;Energy conservation;Dry needling;Other (comment)   Consulted and Agree with Plan of Care Patient        Problem List Patient Active Problem List   Diagnosis Date Noted  . Routine general medical examination at a health care facility 02/28/2015  . Syncope and collapse 01/20/2015  . Encounter for Medicare annual wellness exam 02/06/2013  . Hyperlipidemia 08/27/2012  . Irregular heart beat 08/05/2012  . Gynecological examination 12/20/2010  . Screening for lipoid disorders 12/08/2010  . Eustachian tube dysfunction 11/29/2010  . BARRETTS ESOPHAGUS 10/01/2010  . Osteopenia 12/17/2009  . BACK PAIN, LUMBAR 07/02/2009  . IRRITABLE BOWEL SYNDROME 04/02/2009  . POSTMENOPAUSAL STATUS 12/10/2008  . Hypothyroidism 12/05/2007  . Meniere's disease 12/05/2007  . GERD 12/05/2007    Jerl Mina ,PT, DPT, E-RYT  09/21/2015, 8:07 PM  [place] MAIN [facility] SERVICES [address] [place], [place], [address] Phone: [phone]   Fax:  [phone]  Name: Sarah Harper MRN: [id number] Date of Birth: 10/14/1940

## 2015-09-23 ENCOUNTER — Ambulatory Visit: Payer: Medicare Other | Admitting: Physical Therapy

## 2015-09-24 ENCOUNTER — Ambulatory Visit: Payer: Medicare Other | Attending: Physical Medicine & Rehabilitation | Admitting: Physical Therapy

## 2015-09-24 DIAGNOSIS — M79605 Pain in left leg: Secondary | ICD-10-CM | POA: Insufficient documentation

## 2015-09-24 DIAGNOSIS — R279 Unspecified lack of coordination: Secondary | ICD-10-CM | POA: Diagnosis present

## 2015-09-24 DIAGNOSIS — M629 Disorder of muscle, unspecified: Secondary | ICD-10-CM

## 2015-09-24 NOTE — Therapy (Signed)
[place] MAIN [facility] SERVICES [address] [place], [place], [address] Phone: [phone]   Fax:  [phone]  Physical Therapy Treatment  Patient Details  Name: Sarah Harper MRN: [id number] Date of Birth: October 19, 1940 Referring Provider: Angelene Giovanni   Encounter Date: 09/24/2015      PT End of Session - 09/24/15 1137    Visit Number 12   Date for PT Re-Evaluation 11/04/15   Authorization Type g-code (2th visit)   PT Start Time 0910   PT Stop Time 1015   PT Time Calculation (min) 65 min   Activity Tolerance Patient tolerated treatment well;No increased pain   Behavior During Therapy [facility] for tasks assessed/performed      Past Medical History  Diagnosis Date  . GERD (gastroesophageal reflux disease)   . Thyroid disease     hypothyroid  . IBS (irritable bowel syndrome)   . ETD (eustachian tube dysfunction)   . Meniere's disease   . Migraine   . DDD (degenerative disc disease)     chronic back pain  . Osteoporosis     Past Surgical History  Procedure Laterality Date  . Lumbar disc surgery  1984    L5   . Appendectomy      There were no vitals filed for this visit.  Visit Diagnosis:  Fascial defect  Lack of coordination  Pain of left lower extremity      Subjective Assessment - 09/24/15 0915    Subjective Pt reported her shoulder muscles feel better but there is a residue pain (4/10) at the C/T junction. Modifications to previous HEP has not caused any knee pain. Pt reported she is seeing progress "little by little" and is still motivated to continue with PT.    Pertinent History Hx of scars: appendectomy and lumbar    Patient Stated Goals 1)  be able to walk > 0.5 mile at a tolerable level of pain at 4/10, 2) travel in a car > 20-30 min as driver and passenger             [facility] PT Assessment - 09/24/15 0919    Coordination   Fine Motor Movements are Fluid and Coordinated --  choppy "ahh", "eee" sounds 4-5 sec,  Post-Tx (max 10 sec)    Posture/Postural Control   Posture Comments     AROM   Overall AROM Comments seated cervical  sideflexion on R 30 deg (post-Tx 35 deg) , L 25 deg (post-Tx 30 deg)  , supine cervical rotation: R 40 deg, L 35deg  (post   auditory meatus to acromium: 16 cm bilat Post Tx   Palpation   Spinal mobility increased fascial tensions, hypomobility along T2-7, lateral edge for supraspinous ligament of L T3-4, decreased post-Tx   increased mobility/ decreased tenderness along C7/T1 post Tx   Palpation comment increased tenderness along distal attachments of up trap bil, decreased mobility along Rib 1, (post-Tx decreased)                      [facility] Adult PT Treatment/Exercise - 09/24/15 0919    Therapeutic Activites    Other Therapeutic Activities reassessments from previous visits   Neuro Re-ed    Neuro Re-ed Details  scap depression/ retractionand cervical rectraction   vocalizations "aaa" and "eeee" to activate glottal diaphragm   Exercises   Other Exercises  shoulder abd to 90 deg (modifed 2/2 pt c/o pulling of up trap when perfomed 180 deg)  and see  pt instructions for other exercises    Moist Heat Therapy   Number Minutes Moist Heat --  5 min along back and neck (skin intact post Tx)    Manual Therapy   Myofascial Release Grade II mobs AP 30 sec along  T segments, MWM rotation R for L lateral edge of  T3-4  distraction                 PT Education - 09/24/15 1137    Education provided Yes   Education Details HEP, anatomy and physiology, and cervical/ scapular propioception to correct forward posture    Person(s) Educated Patient   Methods Explanation;Demonstration;Tactile cues;Verbal cues;Handout   Comprehension Verbalized understanding;Returned demonstration             PT Long Term Goals - 09/24/15 1141    PT LONG TERM GOAL #1   Title Pt will decrease her score on ODI from 40% to < 25% in order to return ADLs.   Time 12   Period  Weeks   Status On-going   PT LONG TERM GOAL #2   Title Pt will report being able to walk > .5 mile at a pain level < 4/10 in order to return to ADLs.   Time 12   Period Weeks   Status Achieved   PT LONG TERM GOAL #3   Title Pt will demo decrease abdominal separation of <2 fingers with ability to lift foot off bed in hooklying to demo increase ability to withstand load and progress towards upright strengthening exercises.    Time 12   Period Weeks   Status Achieved   PT LONG TERM GOAL #4   Title Pt will report being able to tolerate sitting in a car for > 30 min in order to participate in community events.   Time 12   Period Weeks   Status Achieved   PT LONG TERM GOAL #5   Title Pt will demo decreased time with 5TST (22 sec to < 15 sec with UE support) and increased gait speed from 1.04 m/s to > 1.13 m/s in order to decrease risks for falls and cross streets.     Time 12   Period Weeks   Status On-going   PT LONG TERM GOAL #6   Title Pt will report Stool Type 3-4 without medication/fiber supplements in order to demo improved pelvic floor function.    Time 12   Period Weeks   Status Deferred   PT LONG TERM GOAL #7   Title Pt will demo BOS that matches the width of her hips with gait across 10 ft w/o cuing.   Time 12   Period Weeks   Status Achieved   PT LONG TERM GOAL #8   Title Pt will report a decrease in rest from 2-3x/day to 2x/day between performing  household chores (putting bed linens on and doing 2 loads of laundry) in order to show improved endurance and pain.    Time 12   Period Weeks   Status Achieved   PT LONG TERM GOAL  #9   TITLE Pt will demo increased hip ext on RLE to 15 deg without lumbar extension in order to walk without radicular pain at long distances.    Time 12   Period Weeks   Status Achieved   PT LONG TERM GOAL  #10   TITLE Pt wil demo decreased paraspinal mm on R across 2 visits in order to demo better management of lumbar mm  to decrease radicular  pain.    Time 12   Period Weeks   Status Achieved   PT LONG TERM GOAL  #11   TITLE Pt will report no burning sesnsation after burning level 2 exercise for 30 reps in order to prepare for longer distance walking.   Time 12   Period Weeks   Status New   PT LONG TERM GOAL  #12   TITLE Pt will be able to walk .5 miles without pain during and after across 3 days in order to return to her PLOF.    Time 12   Period Weeks   Status New   PT LONG TERM GOAL  #13   TITLE Pt will demo decreased forward head at C6/7 and will report no radiating pain with knees over bolsters after 5 min in order to demo less dural tensions and increase tolerance with greater hip/trunk angle for yoga routine.    Time 12   Period Weeks   Status New   PT LONG TERM GOAL  #14   TITLE Pt to increase PF reps on RLE from 3 reps to 7 reps with UE support on wall   in order to minimize relapse of pain and improve R LE weakness.    Time 12   Period Weeks   Status New   PT LONG TERM GOAL  #15   TITLE Pt will report no aggravation of Sx post-yoga routine HEP in order to return to yoga classes. (including cat/cow)    Time 12   Period Weeks   Status New               Plan - 09/24/15 1142    Clinical Impression Statement Suspect the irritability of pt's RLE radiculupathy Sx will decrease once pt's forward posture gets realigned.   Pt's forward posture has improved with a decreased distance from auditory meastus bilaterally from 18 cm to 16 cm. Pt continues to demonstrate increased cervical ROM, decreased tenderness at C7/T1 joints , and decreased mm tensions of B upper trap mm tensions after  manual Tx was applied to increase mobility at the cervical and thoracic spinal segments. Pt required propioception training for cervical retraction and scapular depression and retraction to restore pliability to the both the entire supraspinous ligament and thoracolumbar fascia  to sacrum. Pt has had no complaints performing HEP that  was modified from last session. Pt will benefit from  the continuation of skilled PT.      Pt will benefit from skilled therapeutic intervention in order to improve on the following deficits Abnormal gait;Improper body mechanics;Pain;Increased fascial restricitons;Increased muscle spasms;Postural dysfunction;Decreased mobility;Decreased coordination;Decreased scar mobility;Decreased safety awareness;Decreased balance;Decreased activity tolerance;Decreased endurance;Decreased range of motion;Decreased strength;Hypomobility;Difficulty walking;Impaired flexibility;Impaired perceived functional ability   Rehab Potential Good   PT Frequency 2x / week   PT Duration 12 weeks   PT Treatment/Interventions ADLs/Self Care Home Management;Aquatic Therapy;Cryotherapy;Electrical Stimulation;Biofeedback;Moist Heat;Traction;Gait training;Neuromuscular re-education;Balance training;Therapeutic exercise;Therapeutic activities;Functional mobility training;Stair training;Patient/family education;Scar mobilization;Manual techniques;Taping;Energy conservation;Dry needling;Other (comment)   PT Next Visit Plan reassess cat/ cow, segmentation mobility motor training   Consulted and Agree with Plan of Care Patient        Problem List Patient Active Problem List   Diagnosis Date Noted  . Routine general medical examination at a health care facility 02/28/2015  . Syncope and collapse 01/20/2015  . Encounter for Medicare annual wellness exam 02/06/2013  . Hyperlipidemia 08/27/2012  . Irregular heart beat 08/05/2012  . Gynecological examination 12/20/2010  . Screening  for lipoid disorders 12/08/2010  . Eustachian tube dysfunction 11/29/2010  . BARRETTS ESOPHAGUS 10/01/2010  . Osteopenia 12/17/2009  . BACK PAIN, LUMBAR 07/02/2009  . IRRITABLE BOWEL SYNDROME 04/02/2009  . POSTMENOPAUSAL STATUS 12/10/2008  . Hypothyroidism 12/05/2007  . Meniere's disease 12/05/2007  . GERD 12/05/2007    Jerl Mina ,PT, DPT,  E-RYT  09/24/2015, 12:23 PM  [place] MAIN [facility] SERVICES [address] [place], [place], [address] Phone: [phone]   Fax:  [phone]  Name: Sarah Harper MRN: [id number] Date of Birth: [birth year]

## 2015-09-24 NOTE — Patient Instructions (Addendum)
    1. Snow angel (arms raised to shoulder height only! On inhale)  --> exhale chin tuck and shoulder blades together and down as arms lower to your side   10 x    2. TONING OF AHHHHHHHHH and EEEEEEEEE   5 x   _________________________  3. Elbow and head wall press 10 x : 5 sec   Mini squat against wall with neutral spine (pelvic under, ribcage stacked over front hips) -no arched spine Chin tuck, shoulders down and back as elbows press into wall , count alound for 5 sec        _______________________ 4. Seated upper trunk turn to the R (I DREAM OF JENNIE arms)  20-30 deg to R  10x / 3 x day  , pelvis and legs stable

## 2015-09-27 ENCOUNTER — Ambulatory Visit: Payer: Medicare Other | Admitting: Physical Therapy

## 2015-09-27 DIAGNOSIS — R279 Unspecified lack of coordination: Secondary | ICD-10-CM

## 2015-09-27 DIAGNOSIS — M629 Disorder of muscle, unspecified: Secondary | ICD-10-CM

## 2015-09-27 DIAGNOSIS — M79605 Pain in left leg: Secondary | ICD-10-CM

## 2015-09-27 NOTE — Patient Instructions (Signed)
Supine position:  Shoulder lowering before turnign neck to L 10 x   ___________________  Discontinue seated trunk rotation R  Add awareness of lengthening back upright and then shoulder

## 2015-09-28 NOTE — Therapy (Signed)
[place] MAIN [facility] SERVICES [address] [place], [place], [address] Phone: [phone]   Fax:  [phone]  Physical Therapy Treatment  Patient Details  Name: Sarah Harper MRN: [id number] Date of Birth: 07-11-1941 Referring Provider: Angelene Giovanni   Encounter Date: 09/27/2015      PT End of Session - 09/28/15 1123    Visit Number 13   Date for PT Re-Evaluation 11/04/15   Authorization Type g-code (3th visit)   PT Start Time 0900   PT Stop Time 1000   PT Time Calculation (min) 60 min   Activity Tolerance Patient tolerated treatment well;No increased pain   Behavior During Therapy [facility] for tasks assessed/performed      Past Medical History  Diagnosis Date  . GERD (gastroesophageal reflux disease)   . Thyroid disease     hypothyroid  . IBS (irritable bowel syndrome)   . ETD (eustachian tube dysfunction)   . Meniere's disease   . Migraine   . DDD (degenerative disc disease)     chronic back pain  . Osteoporosis     Past Surgical History  Procedure Laterality Date  . Lumbar disc surgery  1984    L5   . Appendectomy      There were no vitals filed for this visit.  Visit Diagnosis:  Fascial defect  Lack of coordination  Pain of left lower extremity      Subjective Assessment - 09/27/15 0907    Subjective Pt reported she has been able to work for 20 min in her neighborhood ("a little over a half mile") which was a few more driveways more than wait she use to do. Pt does still need to stretch on her yoga block upon finsihing walk but she notices the radiating pain lasting "a little less".  Pt reports her neck feel looser.    Pertinent History Hx of scars: appendectomy and lumbar    Patient Stated Goals 1)  be able to walk > 0.5 mile at a tolerable level of pain at 4/10, 2) travel in a car > 20-30 min as driver and passenger             [facility] PT Assessment - 09/27/15 0939    AROM   Overall AROM Comments supine L  cervical rotation 40deg (pre-Tx) --> 45 deg  (post-Tx), R 60 deg, seated cervical sidebend bilaterally 30deg (post-Tx)     Palpation   Spinal mobility tenderness noted at T1-2 lateral L SP (no change to tenderness post Tx but ROM increased for supine cerv L rotation)    Palpation comment increased L mm tensions along  rhombhoid, mid traps, and iliocostalis   decreased post-Tx                     [facility] Adult PT Treatment/Exercise - 09/27/15 1010    Neuro Re-ed    Neuro Re-ed Details  scap depression/retraction, thoracic ext prior to cervical rotation, chin tuck with gait   awareness to note in HEP w/ scap proioception   Exercises   Other Exercises  supine scap depress/retraction w/ cervical rotation L 10 x    Manual Therapy   Myofascial Release L lateral T1-2    Scapular Mobilization L medial scapular mm releases with propioception training   Manual Traction T1-2                 PT Education - 09/28/15 1123    Education provided Yes   Education  Details HEP   Person(s) Educated Patient   Methods Explanation;Demonstration;Tactile cues;Verbal cues   Comprehension Verbalized understanding;Returned demonstration             PT Long Term Goals - 09/24/15 1141    PT LONG TERM GOAL #1   Title Pt will decrease her score on ODI from 40% to < 25% in order to return ADLs.   Time 12   Period Weeks   Status On-going   PT LONG TERM GOAL #2   Title Pt will report being able to walk > .5 mile at a pain level < 4/10 in order to return to ADLs.   Time 12   Period Weeks   Status Achieved   PT LONG TERM GOAL #3   Title Pt will demo decrease abdominal separation of <2 fingers with ability to lift foot off bed in hooklying to demo increase ability to withstand load and progress towards upright strengthening exercises.    Time 12   Period Weeks   Status Achieved   PT LONG TERM GOAL #4   Title Pt will report being able to tolerate sitting in a car for > 30 min in order  to participate in community events.   Time 12   Period Weeks   Status Achieved   PT LONG TERM GOAL #5   Title Pt will demo decreased time with 5TST (22 sec to < 15 sec with UE support) and increased gait speed from 1.04 m/s to > 1.13 m/s in order to decrease risks for falls and cross streets.     Time 12   Period Weeks   Status On-going   PT LONG TERM GOAL #6   Title Pt will report Stool Type 3-4 without medication/fiber supplements in order to demo improved pelvic floor function.    Time 12   Period Weeks   Status Deferred   PT LONG TERM GOAL #7   Title Pt will demo BOS that matches the width of her hips with gait across 10 ft w/o cuing.   Time 12   Period Weeks   Status Achieved   PT LONG TERM GOAL #8   Title Pt will report a decrease in rest from 2-3x/day to 2x/day between performing  household chores (putting bed linens on and doing 2 loads of laundry) in order to show improved endurance and pain.    Time 12   Period Weeks   Status Achieved   PT LONG TERM GOAL  #9   TITLE Pt will demo increased hip ext on RLE to 15 deg without lumbar extension in order to walk without radicular pain at long distances.    Time 12   Period Weeks   Status Achieved   PT LONG TERM GOAL  #10   TITLE Pt wil demo decreased paraspinal mm on R across 2 visits in order to demo better management of lumbar mm to decrease radicular pain.    Time 12   Period Weeks   Status Achieved   PT LONG TERM GOAL  #11   TITLE Pt will report no burning sesnsation after burning level 2 exercise for 30 reps in order to prepare for longer distance walking.   Time 12   Period Weeks   Status New   PT LONG TERM GOAL  #12   TITLE Pt will be able to walk .5 miles without pain during and after across 3 days in order to return to her PLOF.    Time 12  Period Weeks   Status New   PT LONG TERM GOAL  #13   TITLE Pt will demo decreased forward head at C6/7 and will report no radiating pain with knees over bolsters after 5  min in order to demo less dural tensions and increase tolerance with greater hip/trunk angle for yoga routine.    Time 12   Period Weeks   Status New   PT LONG TERM GOAL  #14   TITLE Pt to increase PF reps on RLE from 3 reps to 7 reps with UE support on wall   in order to minimize relapse of pain and improve R LE weakness.    Time 12   Period Weeks   Status New   PT LONG TERM GOAL  #15   TITLE Pt will report no aggravation of Sx post-yoga routine HEP in order to return to yoga classes. (including cat/cow)    Time 12   Period Weeks   Status New               Plan - 09/28/15 1123    Clinical Impression Statement Pt is progressing well with report of being able to increase her walking distance since last session. Pt continues to benefit from manual Tx with improved cervical ROM L rotation/sidebend and showed good carry over with increased paraspinal mm flexibility. Reassess pt's segmental mobility and gait training w/ head turns at next session. Pt will continue to benefit from skilled PT.   Pt will benefit from skilled therapeutic intervention in order to improve on the following deficits Abnormal gait;Improper body mechanics;Pain;Increased fascial restricitons;Increased muscle spasms;Postural dysfunction;Decreased mobility;Decreased coordination;Decreased scar mobility;Decreased safety awareness;Decreased balance;Decreased activity tolerance;Decreased endurance;Decreased range of motion;Decreased strength;Hypomobility;Difficulty walking;Impaired flexibility;Impaired perceived functional ability   Rehab Potential Good   PT Frequency 2x / week   PT Duration 12 weeks   PT Treatment/Interventions ADLs/Self Care Home Management;Aquatic Therapy;Cryotherapy;Electrical Stimulation;Biofeedback;Moist Heat;Traction;Gait training;Neuromuscular re-education;Balance training;Therapeutic exercise;Therapeutic activities;Functional mobility training;Stair training;Patient/family education;Scar  mobilization;Manual techniques;Taping;Energy conservation;Dry needling;Other (comment)   PT Next Visit Plan reassess cat/ cow, segmentation mobility motor training   Consulted and Agree with Plan of Care Patient        Problem List Patient Active Problem List   Diagnosis Date Noted  . Routine general medical examination at a health care facility 02/28/2015  . Syncope and collapse 01/20/2015  . Encounter for Medicare annual wellness exam 02/06/2013  . Hyperlipidemia 08/27/2012  . Irregular heart beat 08/05/2012  . Gynecological examination 12/20/2010  . Screening for lipoid disorders 12/08/2010  . Eustachian tube dysfunction 11/29/2010  . BARRETTS ESOPHAGUS 10/01/2010  . Osteopenia 12/17/2009  . BACK PAIN, LUMBAR 07/02/2009  . IRRITABLE BOWEL SYNDROME 04/02/2009  . POSTMENOPAUSAL STATUS 12/10/2008  . Hypothyroidism 12/05/2007  . Meniere's disease 12/05/2007  . GERD 12/05/2007    Jerl Mina ,PT, DPT, E-RYT  09/28/2015, 11:27 AM  [place] MAIN [facility] SERVICES [address] [place], [place], [address] Phone: [phone]   Fax:  [phone]  Name: Sarah Harper MRN: [id number] Date of Birth: 1941/05/11

## 2015-09-30 ENCOUNTER — Ambulatory Visit: Payer: Medicare Other | Admitting: Physical Therapy

## 2015-10-01 ENCOUNTER — Ambulatory Visit: Payer: Medicare Other | Admitting: Physical Therapy

## 2015-10-01 DIAGNOSIS — R279 Unspecified lack of coordination: Secondary | ICD-10-CM

## 2015-10-01 DIAGNOSIS — M629 Disorder of muscle, unspecified: Secondary | ICD-10-CM

## 2015-10-01 DIAGNOSIS — M79605 Pain in left leg: Secondary | ICD-10-CM

## 2015-10-01 NOTE — Therapy (Addendum)
[place] MAIN [facility] SERVICES [address] [place], [place], [address] Phone: [phone]   Fax:  [phone]  Physical Therapy Treatment  Patient Details  Name: Sarah Harper MRN: [id number] Date of Birth: 22-Oct-1940 Referring Provider: Angelene Giovanni   Encounter Date: 10/01/2015      PT End of Session - 10/01/15 2329    Visit Number 14   Date for PT Re-Evaluation 11/04/15   Authorization Type g-code (4th visit)   PT Start Time 0905   PT Stop Time 1000   PT Time Calculation (min) 55 min      Past Medical History  Diagnosis Date  . GERD (gastroesophageal reflux disease)   . Thyroid disease     hypothyroid  . IBS (irritable bowel syndrome)   . ETD (eustachian tube dysfunction)   . Meniere's disease   . Migraine   . DDD (degenerative disc disease)     chronic back pain  . Osteoporosis     Past Surgical History  Procedure Laterality Date  . Lumbar disc surgery  1984    L5   . Appendectomy      There were no vitals filed for this visit.  Visit Diagnosis:  Fascial defect  Lack of coordination  Pain of left lower extremity      Subjective Assessment - 10/01/15 1008    Subjective Pt reports she is happy that she is able to walk more this week, achieving > half mile (daily). Pt stretches her back and can resume other activities after walking which is an improvement to her Sx. Pt is able to move her neck more as well. Pt feels pain in both knees because she is walking faster and more often. Pt also notices her R ankle is swollen.    Pertinent History Hx of scars: appendectomy and lumbar    Patient Stated Goals 1)  be able to walk > 0.5 mile at a tolerable level of pain at 4/10, 2) travel in a car > 20-30 min as driver and passenger             [facility] PT Assessment - 10/01/15 1017    Observation/Other Assessments   Observations     55 cm figure 8 around R, 54 cm on L  ankles (post Tx: 54 on R) .     AROM   Overall AROM  Comments seated AROM cervical: 40/40 L/R rotation, sidebend L 30 deg, 40 deg R   acromium to auditory meatus, 16 cm Bilateral         Gait Comments heel striking B w/o COM over tibia in heel strike/stance phase, L collapsed arch                  [facility] Adult PT Treatment/Exercise - 10/01/15 2321    Ambulation/Gait    Explained gait mechanics, proper activation of mm with COM over tibia in stance phase to minimize knee /ankle problems.    Therapeutic Activites    Other Therapeutic Activities reassessments   Neuro Re-ed    Neuro Re-ed Details  gait training cues, resisted walking 103ft x 2, PNF D1 flexion/ext (no p! At midrange hip ER/abd) x 10 x on RLE  cued for shoulder depression w/ exhalation   Manual Therapy   Manual therapy comments distraction at TCJ , AP mobs Grade II, STM along lateral maleoli,    Soft tissue mobilization trigger point release on upper trap L >R seated position  PT Education - 10/01/15 2328    Education provided Yes   Education Details HEP, gait mechanics, weight bearing forces along lower kinetic chain in stance and walking for better management of arthritis and lumbopelvic mechanics   Person(s) Educated Patient   Methods Explanation;Demonstration;Tactile cues;Verbal cues   Comprehension Returned demonstration;Verbalized understanding             PT Long Term Goals - 10/01/15 1040    PT LONG TERM GOAL #1   Title Pt will decrease her score on ODI from 40% to < 25% in order to return ADLs. (10/01/15: 42%)    Time 12   Period Weeks   Status On-going   PT LONG TERM GOAL #2   Title Pt will report being able to walk > .5 mile at a pain level < 4/10 in order to return to ADLs.   Time 12   Period Weeks   Status Achieved   PT LONG TERM GOAL #3   Title Pt will demo decrease abdominal separation of <2 fingers with ability to lift foot off bed in hooklying to demo increase ability to withstand load and progress towards  upright strengthening exercises.    Time 12   Period Weeks   Status Achieved   PT LONG TERM GOAL #4   Title Pt will report being able to tolerate sitting in a car for > 30 min in order to participate in community events.   Time 12   Period Weeks   Status Achieved   PT LONG TERM GOAL #5   Title Pt will demo decreased time with 5TST (22 sec to < 15 sec with UE support) and increased gait speed from 1.04 m/s to > 1.13 m/s in order to decrease risks for falls and cross streets.     Time 12   Period Weeks   Status On-going   PT LONG TERM GOAL #6   Title Pt will report Stool Type 3-4 without medication/fiber supplements in order to demo improved pelvic floor function.    Time 12   Period Weeks   Status Deferred   PT LONG TERM GOAL #7   Title Pt will demo BOS that matches the width of her hips with gait across 10 ft w/o cuing.   Time 12   Period Weeks   Status Achieved   PT LONG TERM GOAL #8   Title Pt will report a decrease in rest from 2-3x/day to 2x/day between performing  household chores (putting bed linens on and doing 2 loads of laundry) in order to show improved endurance and pain.    Time 12   Period Weeks   Status Achieved   PT LONG TERM GOAL  #9   TITLE Pt will demo increased hip ext on RLE to 15 deg without lumbar extension in order to walk without radicular pain at long distances.    Time 12   Period Weeks   Status Achieved   PT LONG TERM GOAL  #10   TITLE Pt wil demo decreased paraspinal mm on R across 2 visits in order to demo better management of lumbar mm to decrease radicular pain.    Time 12   Period Weeks   Status Achieved   PT LONG TERM GOAL  #11   TITLE Pt will report no burning sesnsation after burning level 2 exercise for 30 reps in order to prepare for longer distance walking.   Time 12   Period Weeks   Status Deferred  PT LONG TERM GOAL  #12   TITLE Pt will be able to walk .5 miles without pain during and after across 3 days in order to return to her  PLOF.    Time 12   Period Weeks   Status Achieved   PT LONG TERM GOAL  #13   TITLE Pt will demo decreased forward head at C6/7 and will report no radiating pain with knees over bolsters after 5 min in order to demo less dural tensions and increase tolerance with greater hip/trunk angle for yoga routine.    Time 12   Period Weeks   Status Achieved   PT LONG TERM GOAL  #14   TITLE Pt to increase PF reps on RLE from 3 reps to 7 reps with UE support on wall   in order to minimize relapse of pain and improve R LE weakness.    Time 12   Period Weeks   Status New   PT LONG TERM GOAL  #15   TITLE Pt will report no aggravation of Sx post-yoga routine HEP in order to return to yoga classes. (including cat/cow)    Time 12   Period Weeks   Status New               Plan - 10/01/15 2329    Clinical Impression Statement Pt continues to make good progress with increased ability to walk longer distances with less radiating pain. Pt shows good carry over with increased flexibility and cervical ROM/ cervical retraction.  Pt required manual Tx and neuro reedu for proper gait mechanics to decrease swelling  of knees, ankle, increase longitidinal arch of L foot, increase weight bearing through tibia (less on heel)  during  stance phase, and to decrease heel strike.  Anticipate pt will continue to improve with continued gait training by PT and not only manage sciatic Sx but also arthritis complaints. Plan to implement light resistance band exercise as tolerated.     Pt will benefit from skilled therapeutic intervention in order to improve on the following deficits Abnormal gait;Improper body mechanics;Pain;Increased fascial restricitons;Increased muscle spasms;Postural dysfunction;Decreased mobility;Decreased coordination;Decreased scar mobility;Decreased safety awareness;Decreased balance;Decreased activity tolerance;Decreased endurance;Decreased range of motion;Decreased strength;Hypomobility;Difficulty  walking;Impaired flexibility;Impaired perceived functional ability   Rehab Potential Good   PT Frequency 2x / week   PT Duration 12 weeks   PT Treatment/Interventions ADLs/Self Care Home Management;Aquatic Therapy;Cryotherapy;Electrical Stimulation;Biofeedback;Moist Heat;Traction;Gait training;Neuromuscular re-education;Balance training;Therapeutic exercise;Therapeutic activities;Functional mobility training;Stair training;Patient/family education;Scar mobilization;Manual techniques;Taping;Energy conservation;Dry needling;Other (comment)   PT Next Visit Plan reassess cat/ cow, segmentation mobility motor training   Consulted and Agree with Plan of Care Patient        Problem List Patient Active Problem List   Diagnosis Date Noted  . Routine general medical examination at a health care facility 02/28/2015  . Syncope and collapse 01/20/2015  . Encounter for Medicare annual wellness exam 02/06/2013  . Hyperlipidemia 08/27/2012  . Irregular heart beat 08/05/2012  . Gynecological examination 12/20/2010  . Screening for lipoid disorders 12/08/2010  . Eustachian tube dysfunction 11/29/2010  . BARRETTS ESOPHAGUS 10/01/2010  . Osteopenia 12/17/2009  . BACK PAIN, LUMBAR 07/02/2009  . IRRITABLE BOWEL SYNDROME 04/02/2009  . POSTMENOPAUSAL STATUS 12/10/2008  . Hypothyroidism 12/05/2007  . Meniere's disease 12/05/2007  . GERD 12/05/2007    Jerl Mina ,PT, DPT, E-RYT  10/01/2015, 11:35 PM  [place] MAIN [facility] SERVICES [address] [place], [place], [address] Phone: [phone]   Fax:  [phone]  Name:  Sarah Harper MRN: [id number] Date of Birth: 1940-11-03

## 2015-10-07 ENCOUNTER — Encounter: Payer: Medicare Other | Admitting: Physical Therapy

## 2015-10-08 ENCOUNTER — Ambulatory Visit: Payer: Medicare Other | Admitting: Physical Therapy

## 2015-10-08 DIAGNOSIS — R279 Unspecified lack of coordination: Secondary | ICD-10-CM

## 2015-10-08 DIAGNOSIS — M629 Disorder of muscle, unspecified: Secondary | ICD-10-CM | POA: Diagnosis not present

## 2015-10-08 DIAGNOSIS — M79605 Pain in left leg: Secondary | ICD-10-CM

## 2015-10-08 NOTE — Patient Instructions (Addendum)
Laying on back, opp knee bent and foot stabilized Hamstring stretch with strap:  5 swings of feet with ballmound and toes pressed wide on strap   3 breaths  W/ knee straight     Standing Calf stretch  5 breaths with heel down 5 breaths heel up, knee bent    Strengthening instrinsic feet muscles Heel raises on step 10 x  2   Tracking L knee   dycem under pillow to prevent from sliding   Hand on counter, step L foot on pillow and track knee across 1st/2nd toe , carry center over knee as you swing opposite foot on floor in front   (THEME: stacking of core over lower joints)

## 2015-10-08 NOTE — Therapy (Signed)
[place] MAIN [facility] SERVICES [address] [place], [place], [address] Phone: [phone]   Fax:  [phone]  Physical Therapy Treatment  Patient Details  Name: Sarah Harper MRN: [id number] Date of Birth: 1941/02/11 Referring Provider: Angelene Giovanni   Encounter Date: 10/08/2015      PT End of Session - 10/08/15 1835    Visit Number 15   Authorization Type g-code (5th visit)   PT Start Time 0900   PT Stop Time 1000   PT Time Calculation (min) 60 min   Activity Tolerance Patient tolerated treatment well;No increased pain   Behavior During Therapy [facility] for tasks assessed/performed      Past Medical History  Diagnosis Date  . GERD (gastroesophageal reflux disease)   . Thyroid disease     hypothyroid  . IBS (irritable bowel syndrome)   . ETD (eustachian tube dysfunction)   . Meniere's disease   . Migraine   . DDD (degenerative disc disease)     chronic back pain  . Osteoporosis     Past Surgical History  Procedure Laterality Date  . Lumbar disc surgery  1984    L5   . Appendectomy      There were no vitals filed for this visit.  Visit Diagnosis:  Fascial defect  Lack of coordination  Pain of left lower extremity      Subjective Assessment - 10/08/15 0905    Subjective Pt reports no radiating pain down RLE for past 3-4 days and she no longer feels numbness in her foot. Pt finds both knees to be more problematic with walking. Pt practiced gait training in her house and she is getting used to it.    Pertinent History Hx of scars: appendectomy and lumbar    Patient Stated Goals 1)  be able to walk > 0.5 mile at a tolerable level of pain at 4/10, 2) travel in a car > 20-30 min as driver and passenger             [facility] PT Assessment - 10/08/15 0908    Observation/Other Assessments   Observations 54 cm figure 8 on R ankle , L 54 cm    Strength   Overall Strength --  R PF 3reps of 4/5 Grade    Ambulation/Gait   Stair  Management Technique --  sidestepping down L leading: L genu valgus                     [facility] Adult PT Treatment/Exercise - 10/08/15 0908    Ambulation/Gait   Gait Comments improved gait   cued knee flexion during swing to minimize knee pain   Neuro Re-ed    Neuro Re-ed Details  sidesteppindown on stairs/ gait training for increased eversion across metatarsal on L foot to lift arch, improve knee alignment    Exercises   Other Exercises  see pt instructions   balance disk inside // bars for step down exercises 10x                 PT Education - 10/08/15 1833    Education provided Yes   Education Details HEP   Person(s) Educated Patient   Methods Explanation;Demonstration;Tactile cues;Verbal cues;Handout   Comprehension Returned demonstration;Verbalized understanding             PT Long Term Goals - 10/01/15 1040    PT LONG TERM GOAL #1   Title Pt will decrease her score on ODI from 40%  to < 25% in order to return ADLs. (10/01/15: 42%)    Time 12   Period Weeks   Status On-going   PT LONG TERM GOAL #2   Title Pt will report being able to walk > .5 mile at a pain level < 4/10 in order to return to ADLs.   Time 12   Period Weeks   Status Achieved   PT LONG TERM GOAL #3   Title Pt will demo decrease abdominal separation of <2 fingers with ability to lift foot off bed in hooklying to demo increase ability to withstand load and progress towards upright strengthening exercises.    Time 12   Period Weeks   Status Achieved   PT LONG TERM GOAL #4   Title Pt will report being able to tolerate sitting in a car for > 30 min in order to participate in community events.   Time 12   Period Weeks   Status Achieved   PT LONG TERM GOAL #5   Title Pt will demo decreased time with 5TST (22 sec to < 15 sec with UE support) and increased gait speed from 1.04 m/s to > 1.13 m/s in order to decrease risks for falls and cross streets.     Time 12   Period Weeks   Status  On-going   PT LONG TERM GOAL #6   Title Pt will report Stool Type 3-4 without medication/fiber supplements in order to demo improved pelvic floor function.    Time 12   Period Weeks   Status Deferred   PT LONG TERM GOAL #7   Title Pt will demo BOS that matches the width of her hips with gait across 10 ft w/o cuing.   Time 12   Period Weeks   Status Achieved   PT LONG TERM GOAL #8   Title Pt will report a decrease in rest from 2-3x/day to 2x/day between performing  household chores (putting bed linens on and doing 2 loads of laundry) in order to show improved endurance and pain.    Time 12   Period Weeks   Status Achieved   PT LONG TERM GOAL  #9   TITLE Pt will demo increased hip ext on RLE to 15 deg without lumbar extension in order to walk without radicular pain at long distances.    Time 12   Period Weeks   Status Achieved   PT LONG TERM GOAL  #10   TITLE Pt wil demo decreased paraspinal mm on R across 2 visits in order to demo better management of lumbar mm to decrease radicular pain.    Time 12   Period Weeks   Status Achieved   PT LONG TERM GOAL  #11   TITLE Pt will report no burning sesnsation after burning level 2 exercise for 30 reps in order to prepare for longer distance walking.   Time 12   Period Weeks   Status Deferred   PT LONG TERM GOAL  #12   TITLE Pt will be able to walk .5 miles without pain during and after across 3 days in order to return to her PLOF.    Time 12   Period Weeks   Status Achieved   PT LONG TERM GOAL  #13   TITLE Pt will demo decreased forward head at C6/7 and will report no radiating pain with knees over bolsters after 5 min in order to demo less dural tensions and increase tolerance with greater hip/trunk angle for yoga  routine.    Time 12   Period Weeks   Status Achieved   PT LONG TERM GOAL  #14   TITLE Pt to increase PF reps on RLE from 3 reps to 7 reps with UE support on wall   in order to minimize relapse of pain and improve R LE  weakness.    Time 12   Period Weeks   Status New   PT LONG TERM GOAL  #15   TITLE Pt will report no aggravation of Sx post-yoga routine HEP in order to return to yoga classes. (including cat/cow)    Time 12   Period Weeks   Status New               Plan - 10/08/15 1836    Clinical Impression Statement Pt is progressing well with report of no numbness in her R foot nor raditating pain down her RLE for the past few days. Her remaining complaint involves bilateral knee pain due to  L genu valgus, collapsed foot arch, and poor motor control of LE kinetic chain. TOday's session was focused on addressing these deficits. Pt toated session and required low grade exercises as pt prefered not to use weight training nor resistive bands due to negative outcomes in previous PT rehab experiences (increased pain). Pt contnues to benefit form skilled PT but has decreased to once a wekk due to pt's improvement.        Pt will benefit from skilled therapeutic intervention in order to improve on the following deficits Abnormal gait;Improper body mechanics;Pain;Increased fascial restricitons;Increased muscle spasms;Postural dysfunction;Decreased mobility;Decreased coordination;Decreased scar mobility;Decreased safety awareness;Decreased balance;Decreased activity tolerance;Decreased endurance;Decreased range of motion;Decreased strength;Hypomobility;Difficulty walking;Impaired flexibility;Impaired perceived functional ability   Rehab Potential Good   PT Frequency 2x / week   PT Duration 12 weeks   PT Treatment/Interventions ADLs/Self Care Home Management;Aquatic Therapy;Cryotherapy;Electrical Stimulation;Biofeedback;Moist Heat;Traction;Gait training;Neuromuscular re-education;Balance training;Therapeutic exercise;Therapeutic activities;Functional mobility training;Stair training;Patient/family education;Scar mobilization;Manual techniques;Taping;Energy conservation;Dry needling;Other (comment)   PT Next Visit  Plan reassess cat/ cow, segmentation mobility motor training   Consulted and Agree with Plan of Care Patient        Problem List Patient Active Problem List   Diagnosis Date Noted  . Routine general medical examination at a health care facility 02/28/2015  . Syncope and collapse 01/20/2015  . Encounter for Medicare annual wellness exam 02/06/2013  . Hyperlipidemia 08/27/2012  . Irregular heart beat 08/05/2012  . Gynecological examination 12/20/2010  . Screening for lipoid disorders 12/08/2010  . Eustachian tube dysfunction 11/29/2010  . BARRETTS ESOPHAGUS 10/01/2010  . Osteopenia 12/17/2009  . BACK PAIN, LUMBAR 07/02/2009  . IRRITABLE BOWEL SYNDROME 04/02/2009  . POSTMENOPAUSAL STATUS 12/10/2008  . Hypothyroidism 12/05/2007  . Meniere's disease 12/05/2007  . GERD 12/05/2007    Jerl Mina ,PT, DPT, E-RYT  10/08/2015, 6:42 PM  [place] MAIN [facility] SERVICES [address] [place], [place], [address] Phone: [phone]   Fax:  [phone]  Name: Sarah Harper MRN: [id number] Date of Birth: [birth year]

## 2015-10-12 ENCOUNTER — Ambulatory Visit: Payer: Medicare Other | Admitting: Physical Therapy

## 2015-10-12 DIAGNOSIS — M79605 Pain in left leg: Secondary | ICD-10-CM

## 2015-10-12 DIAGNOSIS — M629 Disorder of muscle, unspecified: Secondary | ICD-10-CM

## 2015-10-12 DIAGNOSIS — R279 Unspecified lack of coordination: Secondary | ICD-10-CM

## 2015-10-12 NOTE — Therapy (Signed)
[place] MAIN [facility] SERVICES [address] [place], [place], [address] Phone: [phone]   Fax:  [phone]  Physical Therapy Treatment  Patient Details  Name: Sarah Harper MRN: [id number] Date of Birth: 1941-01-17 Referring Provider: Angelene Giovanni   Encounter Date: 10/12/2015      PT End of Session - 10/12/15 1738    Visit Number 16   Authorization Type g-code (6th visit)   PT Start Time 1430   PT Stop Time 1530   PT Time Calculation (min) 60 min      Past Medical History  Diagnosis Date  . GERD (gastroesophageal reflux disease)   . Thyroid disease     hypothyroid  . IBS (irritable bowel syndrome)   . ETD (eustachian tube dysfunction)   . Meniere's disease   . Migraine   . DDD (degenerative disc disease)     chronic back pain  . Osteoporosis     Past Surgical History  Procedure Laterality Date  . Lumbar disc surgery  1984    L5   . Appendectomy      There were no vitals filed for this visit.  Visit Diagnosis:  Fascial defect  Lack of coordination  Pain of left lower extremity      Subjective Assessment - 10/12/15 1431    Subjective Pt reported she tried doing activities "like a normal person" by taking light objects up 16 steps x 4 trips after walking long distances between parking garages and [employer]  Pt did noticed 8/10 pain with radiating pain down to her R heel that night but it dissipated by the morning. Pt stated her R knee was achey after stairclimbing while her L knee has gone down in swelling some and was not  painful with stairs.     Pertinent History Hx of scars: appendectomy and lumbar    Patient Stated Goals 1)  be able to walk > 0.5 mile at a tolerable level of pain at 4/10, 2) travel in a car > 20-30 min as driver and passenger             [facility] PT Assessment - 10/12/15 1437    AROM   Overall AROM Comments supine cervical rotation L 40deg/ R 60 deg (pre-Tx), 60 deg bilaterally (post Tx)     Strength   Overall Strength --  R grade 4/5 , 7 reps of PF    Palpation   Spinal mobility decreased mobility on R TP of T1-2 (increased post Tx)    Palpation comment increased mm tensions along distal attachment of levator scapular, intrinsic back mmuscles at C7-T3 on R (decreased post Tx)                      [facility] Adult PT Treatment/Exercise - 10/12/15 1733    Therapeutic Activites    Other Therapeutic Activities simulated driving with end range cervical rotation L with trunk rotation,  cervical rotation after scap retraction with head turns with stop/walk 40 ft    Neuro Re-ed    Neuro Re-ed Details  activity pacing, cues for dissociation of cervical spine from trunk versus trunk rotation w/ cerical rotation    Exercises   Other Exercises  see pt instructions    Manual Therapy   Joint Mobilization side glide L T1-2, distraction at occipute and lower cervical   Soft tissue mobilization problem areas (see assessments)  PT Long Term Goals - 10/12/15 1440    PT LONG TERM GOAL #1   Title (p) Pt will decrease her score on ODI from 40% to < 25% in order to return ADLs. (10/01/15: 42%)    Time (p) 12   Period (p) Weeks   Status (p) On-going   PT LONG TERM GOAL #2   Title (p) Pt will report being able to walk > .5 mile at a pain level < 4/10 in order to return to ADLs.   Time (p) 12   Period (p) Weeks   Status (p) Achieved   PT LONG TERM GOAL #3   Title (p) Pt will demo decrease abdominal separation of <2 fingers with ability to lift foot off bed in hooklying to demo increase ability to withstand load and progress towards upright strengthening exercises.    Time (p) 12   Period (p) Weeks   Status (p) Achieved   PT LONG TERM GOAL #4   Title (p) Pt will report being able to tolerate sitting in a car for > 30 min in order to participate in community events.   Time (p) 12   Period (p) Weeks   Status (p) Achieved   PT LONG TERM GOAL #5    Title (p) Pt will demo decreased time with 5TST (22 sec to < 15 sec with UE support) and increased gait speed from 1.04 m/s to > 1.13 m/s in order to decrease risks for falls and cross streets.     Time (p) 12   Period (p) Weeks   Status (p) On-going   PT LONG TERM GOAL #6   Title (p) Pt will report Stool Type 3-4 without medication/fiber supplements in order to demo improved pelvic floor function.    Time (p) 12   Period (p) Weeks   Status (p) Deferred   PT LONG TERM GOAL #7   Title (p) Pt will demo BOS that matches the width of her hips with gait across 10 ft w/o cuing.   Time (p) 12   Period (p) Weeks   Status (p) Achieved   PT LONG TERM GOAL #8   Title (p) Pt will report a decrease in rest from 2-3x/day to 2x/day between performing  household chores (putting bed linens on and doing 2 loads of laundry) in order to show improved endurance and pain.    Time (p) 12   Period (p) Weeks   Status (p) Achieved   PT LONG TERM GOAL  #9   TITLE (p) Pt will demo increased hip ext on RLE to 15 deg without lumbar extension in order to walk without radicular pain at long distances.    Time (p) 12   Period (p) Weeks   Status (p) Achieved   PT LONG TERM GOAL  #10   TITLE (p) Pt wil demo decreased paraspinal mm on R across 2 visits in order to demo better management of lumbar mm to decrease radicular pain.    Time (p) 12   Period (p) Weeks   Status (p) Achieved   PT LONG TERM GOAL  #11   TITLE (p) Pt will report no burning sesnsation after burning level 2 exercise for 30 reps in order to prepare for longer distance walking.   Time (p) 12   Period (p) Weeks   Status (p) Deferred   PT LONG TERM GOAL  #12   TITLE (p) Pt will be able to walk .5 miles without pain during and  after across 3 days in order to return to her PLOF.    Time (p) 12   Period (p) Weeks   Status (p) Achieved   PT LONG TERM GOAL  #13   TITLE (p) Pt will demo decreased forward head at C6/7 and will report no radiating pain  with knees over bolsters after 5 min in order to demo less dural tensions and increase tolerance with greater hip/trunk angle for yoga routine.    Time (p) 12   Period (p) Weeks   Status (p) Achieved   PT LONG TERM GOAL  #14   TITLE (p) Pt to increase PF reps on RLE from 3 reps to 7 reps with UE support on wall   in order to minimize relapse of pain and improve R LE weakness.    Time (p) 12   Period (p) Weeks   Status (p) Achieved   PT LONG TERM GOAL  #15   TITLE (p) Pt will report no aggravation of Sx post-yoga routine HEP in order to return to yoga classes. (including cat/cow)    Time (p) 12   Period (p) Weeks   Status (p) Ongoing                Plan - 10/12/15 1738    Clinical Impression Statement Pt has achieved another goal today with improvements in ability to perform increased reps of plantarflexion on RLE, regaining bilateral cervical AROM rotation, especially with head turns while walking. Pt also reported decreased swelling/ knee pain w/ walking since last session. Pt continues to make progress as she reported significantly shorter lasting of radiating LBP after engaging in increased walking and community activities. Pt also states she has not have the numbness sensation in her R foot for the past 2 weeks. Pt demonstrates good carry over with decreased mm tensions and increased SIJ and spinal mobility in gait the past few weeks.  Anticipate pt will continue to advance towards her goals. Pt will benefit from skilled PT to minimize relapse of Sx      Pt will benefit from skilled therapeutic intervention in order to improve on the following deficits Abnormal gait;Improper body mechanics;Pain;Increased fascial restricitons;Increased muscle spasms;Postural dysfunction;Decreased mobility;Decreased coordination;Decreased scar mobility;Decreased safety awareness;Decreased balance;Decreased activity tolerance;Decreased endurance;Decreased range of motion;Decreased  strength;Hypomobility;Difficulty walking;Impaired flexibility;Impaired perceived functional ability   Rehab Potential Good   PT Frequency 2x / week   PT Duration 12 weeks   PT Treatment/Interventions ADLs/Self Care Home Management;Aquatic Therapy;Cryotherapy;Electrical Stimulation;Biofeedback;Moist Heat;Traction;Gait training;Neuromuscular re-education;Balance training;Therapeutic exercise;Therapeutic activities;Functional mobility training;Stair training;Patient/family education;Scar mobilization;Manual techniques;Taping;Energy conservation;Dry needling;Other (comment)   PT Next Visit Plan reassess cat/ cow, segmentation mobility motor training   Consulted and Agree with Plan of Care Patient        Problem List Patient Active Problem List   Diagnosis Date Noted  . Routine general medical examination at a health care facility 02/28/2015  . Syncope and collapse 01/20/2015  . Encounter for Medicare annual wellness exam 02/06/2013  . Hyperlipidemia 08/27/2012  . Irregular heart beat 08/05/2012  . Gynecological examination 12/20/2010  . Screening for lipoid disorders 12/08/2010  . Eustachian tube dysfunction 11/29/2010  . BARRETTS ESOPHAGUS 10/01/2010  . Osteopenia 12/17/2009  . BACK PAIN, LUMBAR 07/02/2009  . IRRITABLE BOWEL SYNDROME 04/02/2009  . POSTMENOPAUSAL STATUS 12/10/2008  . Hypothyroidism 12/05/2007  . Meniere's disease 12/05/2007  . GERD 12/05/2007    Sara Chu, DPT, E-RYT  10/12/2015, 5:42 PM  [place] MAIN [facility] SERVICES [address]  [place], [place], [address] Phone: [phone]   Fax:  [phone]  Name: Sarah Harper MRN: [id number] Date of Birth: 1941/01/07

## 2015-10-12 NOTE — Patient Instructions (Signed)
Cervical range of motion exercises in the morning  Chin up and down Side bend L R  Rotate L R    5 x each side    ___________________   Seated seatbelt stretch with strap over R shoulder with neck turn Left   5 breaths    ____________________   Pre/Post walking stretches to lubricate hip/knee joints   Laying on your back Opposite Knee bent:   other foot pressed into strap by [place]:  Rock thigh over 20-30 deg L and R to lubricate the hip socket    Knee pumps (bend knee ) 10 x   Hamstring stretch 5 breaths

## 2015-10-14 ENCOUNTER — Encounter: Payer: Medicare Other | Admitting: Physical Therapy

## 2015-10-15 ENCOUNTER — Ambulatory Visit: Payer: Medicare Other | Admitting: Family Medicine

## 2015-10-18 ENCOUNTER — Encounter: Payer: Self-pay | Admitting: Family Medicine

## 2015-10-18 ENCOUNTER — Ambulatory Visit (INDEPENDENT_AMBULATORY_CARE_PROVIDER_SITE_OTHER)
Admission: RE | Admit: 2015-10-18 | Discharge: 2015-10-18 | Disposition: A | Discharge status: Home / Self Care | Payer: Medicare Other | Source: Ambulatory Visit | Attending: Family Medicine | Admitting: Family Medicine

## 2015-10-18 ENCOUNTER — Ambulatory Visit (INDEPENDENT_AMBULATORY_CARE_PROVIDER_SITE_OTHER): Payer: Medicare Other | Admitting: Family Medicine

## 2015-10-18 VITALS — BP 126/82 | HR 69 | Temp 97.6°F | Ht 66.0 in | Wt 139.5 lb

## 2015-10-18 DIAGNOSIS — M25471 Effusion, right ankle: Secondary | ICD-10-CM | POA: Insufficient documentation

## 2015-10-18 DIAGNOSIS — M25562 Pain in left knee: Secondary | ICD-10-CM

## 2015-10-18 DIAGNOSIS — R0683 Snoring: Secondary | ICD-10-CM | POA: Diagnosis not present

## 2015-10-18 NOTE — Patient Instructions (Signed)
For ankle swelling- you can pick up some knees highs with support to wear during the day  Xray of ankle today  I suspect knee pain is from either arthritis or tendonitis- also aggravated by walking Xray of knee today  Use ice on it before and after exercise  A neoprene knee sleeve may help during your PT   Also do not eat too much salt/ sodium/processed foods   Sleeping on your back may worsen snoring  Stop at check out for referral to ENT for snoring and day time sleepiness

## 2015-10-18 NOTE — Assessment & Plan Note (Signed)
Intermittent and no cardiac symptoms  Nl today Rev last echo -nl  Suspect this may be from venous insuff -recommend knee high hose with support  Xray to r/o bony abn today

## 2015-10-18 NOTE — Progress Notes (Signed)
Subjective:    Patient ID: Sarah Harper, female    DOB: 1941-07-26, 75 y.o.   MRN: [id number]  HPI Here for several issues   Sleeping problem She sleeps ok and then wakes herself up with sound - snort/snore/gasp- happens fairly often Not getting as much sleep Going on for 2 months ? What causes it - perhaps from sleeping on her back  Has been a snorer for 2 years  Husband obs this  No witnessed apnea but has gasping   Falls asleep easily when she sits down  She does dream easily  Lab Results  Component Value Date   TSH 2.31 01/20/2015     Gets up a fair amount to go to the bathroom 0-2   Wt is up 1.5 lb with bmi of 22-not overwt   Also chronic pain - from arthritis -this makes her tired   Starting her 3rd mo of PT with specialist at [facility] - this is helping her to change gait and position to minimize gait  Will move into strengthening also  She suggested that she sleep on her back   Now her R ankle is swelling off and on- does not think it is dependent  No known OA in ankle  Not painful  Depends on the day  L knee also swells-it does hurt - suspect due to walking/ PT  She has known OA of that knee   Takes fiorcet day for pain  1/2 tramadol at night if needed  Also uses lidocaine ointment daily   No cardiac symptoms No exercise intolerance  No sob on exertion  No PND or orthopnea  No further syncope - saw Dr Rockey Situ for this - had a big w/u for this   2D echo in June -normal   Patient Active Problem List   Diagnosis Date Noted  . Routine general medical examination at a health care facility 02/28/2015  . Syncope and collapse 01/20/2015  . Encounter for Medicare annual wellness exam 02/06/2013  . Hyperlipidemia 08/27/2012  . Irregular heart beat 08/05/2012  . Gynecological examination 12/20/2010  . Screening for lipoid disorders 12/08/2010  . Eustachian tube dysfunction 11/29/2010  . BARRETTS ESOPHAGUS 10/01/2010  . Osteopenia 12/17/2009    . BACK PAIN, LUMBAR 07/02/2009  . IRRITABLE BOWEL SYNDROME 04/02/2009  . POSTMENOPAUSAL STATUS 12/10/2008  . Hypothyroidism 12/05/2007  . Meniere's disease 12/05/2007  . GERD 12/05/2007   Past Medical History  Diagnosis Date  . GERD (gastroesophageal reflux disease)   . Thyroid disease     hypothyroid  . IBS (irritable bowel syndrome)   . ETD (eustachian tube dysfunction)   . Meniere's disease   . Migraine   . DDD (degenerative disc disease)     chronic back pain  . Osteoporosis    Past Surgical History  Procedure Laterality Date  . Lumbar disc surgery  1984    L5   . Appendectomy     Social History  Substance Use Topics  . Smoking status: Never Smoker   . Smokeless tobacco: None  . Alcohol Use: 0.6 oz/week    1 Glasses of wine per week     Comment: wine occasional   Family History  Problem Relation Age of Onset  . Diabetes Paternal Aunt   . Hypertension Maternal Grandmother   . Hypertension Maternal Grandfather    Allergies  Allergen Reactions  . Epinephrine    Current Outpatient Prescriptions on File Prior to Visit  Medication Sig Dispense Refill  .  b complex vitamins tablet Take 1 tablet by mouth daily. 30 tablet 0  . butalbital-acetaminophen-caffeine (FIORICET) 50-325-40 MG per tablet Take 1 tablet by mouth every 6 (six) hours as needed. 90 tablet 3  . Calcium Carb-Cholecalciferol (CALCIUM 600 + D PO) Take 1 tablet by mouth daily.    . Cholecalciferol (VITAMIN D) 1000 UNITS capsule Take 1,000 Units by mouth daily.      Marland Kitchen dexlansoprazole (DEXILANT) 60 MG capsule Take 1 capsule (60 mg total) by mouth daily. 90 capsule 3  . fluticasone (FLONASE) 50 MCG/ACT nasal spray Place 2 sprays into the nose as needed.     Marland Kitchen levothyroxine (SYNTHROID, LEVOTHROID) 88 MCG tablet Take 1 tablet (88 mcg total) by mouth daily before breakfast. 90 tablet 3  . lidocaine (XYLOCAINE) 5 % ointment Apply 1 application topically as needed. 150 g 3  . Linaclotide (LINZESS) 145 MCG CAPS  capsule Take 145 mcg by mouth daily.    Marland Kitchen PREMARIN vaginal cream APPLY SMALL AMOUNT VAGINALLY 2 TO 3 TIMES A WEEK 90 g 1  . pseudoephedrine (SUDAFED) 60 MG tablet Take 60 mg by mouth daily.     . traMADol (ULTRAM) 50 MG tablet Take 50 mg by mouth as needed for pain. Reported on 08/05/2015     No current facility-administered medications on file prior to visit.      Review of Systems Review of Systems  Constitutional: Negative for fever, appetite change, and unexpected weight change. pos for fatigue and day time somnolence  Eyes: Negative for pain and visual disturbance.  ENT pos for chronic nasal congestion/ neg for st/ pos for PND and dry mouth  Respiratory: Negative for cough and shortness of breath.   Cardiovascular: Negative for cp or palpitations    Gastrointestinal: Negative for nausea, diarrhea and constipation.  Genitourinary: Negative for urgency and frequency.  Skin: Negative for pallor or rash   MSK pos for L knee pain/ R ankle swelling and OA /ddd in spine  Neurological: Negative for weakness, light-headedness, numbness and headaches.  Hematological: Negative for adenopathy. Does not bruise/bleed easily.  Psychiatric/Behavioral: Negative for dysphoric mood. The patient is not nervous/anxious.         Objective:   Physical Exam  Constitutional: She appears well-developed and well-nourished. No distress.  Well appearing Not overwt   HENT:  Head: Normocephalic and atraumatic.  Right Ear: External ear normal.  Left Ear: External ear normal.  Mouth/Throat: Oropharynx is clear and moist. No oropharyngeal exudate.  Nares are congested bilat  Scant clear rhinorrhea   Eyes: Conjunctivae and EOM are normal. Pupils are equal, round, and reactive to light.  Neck: Normal range of motion. Neck supple. No JVD present. Carotid bruit is not present. No thyromegaly present.  Cardiovascular: Normal rate, regular rhythm, normal heart sounds and intact distal pulses.  Exam reveals no  gallop.   Pulmonary/Chest: Effort normal and breath sounds normal. No respiratory distress. She has no wheezes. She has no rales.  No crackles  Abdominal: Soft. Bowel sounds are normal. She exhibits no distension, no abdominal bruit and no mass. There is no tenderness.  Musculoskeletal: She exhibits no edema.       Left knee: She exhibits decreased range of motion. She exhibits no swelling, no effusion, no ecchymosis, no deformity, normal alignment, no LCL laxity, normal patellar mobility, no bony tenderness, normal meniscus and no MCL laxity. Tenderness found. Medial joint line and patellar tendon tenderness noted.       Right ankle: Normal. She exhibits  normal range of motion, no swelling, no ecchymosis and normal pulse. No tenderness. Achilles tendon normal.  Also some patellofemoral pain  No crepitus  Can flex about 120 degrees before pain starts Nl stability- ant drawer and lachman   R ankle appears normal  Some varicosities both legs that are compressible   Lymphadenopathy:    She has no cervical adenopathy.  Neurological: She is alert. She has normal strength and normal reflexes. She displays no atrophy. No sensory deficit. She exhibits normal muscle tone. Coordination and gait normal.  Gait is slow and steady  Skin: Skin is warm and dry. No rash noted.  Psychiatric: She has a normal mood and affect.          Assessment & Plan:   Problem List Items Addressed This Visit      Other   Left knee pain    Suspect OA or tendonitis- from recent inc in walking due to PT Has OA in several joints  Reassuring exam xr today Disc use of ice pre and post exercise Also a knee sleeve for support during PT       Relevant Orders   DG Knee Complete 4 Views Left   Right ankle swelling - Primary    Intermittent and no cardiac symptoms  Nl today Rev last echo -nl  Suspect this may be from venous insuff -recommend knee high hose with support  Xray to r/o bony abn today      Relevant  Orders   DG Ankle Complete Right   Snoring    With day time somnolence - disc poss sleep apnea Witnessed by husband  Waking up very frequently Worse since starting to sleep on back (as recommended by PT) Hx of chronic nasal cong Ref to ENT to see if they can set up sleep study or other eval       Relevant Orders   Ambulatory referral to ENT

## 2015-10-18 NOTE — Progress Notes (Signed)
Pre visit review using our clinic review tool, if applicable. No additional management support is needed unless otherwise documented below in the visit note. 

## 2015-10-18 NOTE — Assessment & Plan Note (Signed)
With day time somnolence - disc poss sleep apnea Witnessed by husband  Waking up very frequently Worse since starting to sleep on back (as recommended by PT) Hx of chronic nasal cong Ref to ENT to see if they can set up sleep study or other eval

## 2015-10-18 NOTE — Assessment & Plan Note (Signed)
Suspect OA or tendonitis- from recent inc in walking due to PT Has OA in several joints  Reassuring exam xr today Disc use of ice pre and post exercise Also a knee sleeve for support during PT

## 2015-10-19 ENCOUNTER — Encounter: Payer: Self-pay | Admitting: Family Medicine

## 2015-10-22 ENCOUNTER — Ambulatory Visit: Payer: Medicare Other | Attending: Physical Medicine & Rehabilitation | Admitting: Physical Therapy

## 2015-10-22 DIAGNOSIS — M629 Disorder of muscle, unspecified: Secondary | ICD-10-CM

## 2015-10-22 DIAGNOSIS — R279 Unspecified lack of coordination: Secondary | ICD-10-CM | POA: Diagnosis present

## 2015-10-22 DIAGNOSIS — M79605 Pain in left leg: Secondary | ICD-10-CM | POA: Insufficient documentation

## 2015-10-22 NOTE — Patient Instructions (Signed)
PELVIC FLOOR / KEGEL EXERCISES   Pelvic floor/ Kegel exercises are used to strengthen the muscles in the base of your pelvis that are responsible for supporting your pelvic organs and preventing urine/feces leakage. Based on your therapist's recommendations, they can be performed while standing, sitting, or lying down.  Make yourself aware of this muscle group by using these cues:  Imagine you are in a crowded room and you feel the need to pass gas. Your response is to pull up and in at the rectum.  Close the rectum. Pull the muscles up inside your body,feeling your scrotum lifting as well . Feel the pelvic floor muscles lift as if you were walking into a cold lake.  Place your hand on top of your pubic bone. Tighten and draw in the muscles around the anal muscles without squeezing the buttock muscles.  Common Errors:  Breath holding: If you are holding your breath, you may be bearing down against your bladder instead of pulling it up. If you belly bulges up while you are squeezing, you are holding your breath. Be sure to breathe gently in and out while exercising. Counting out loud may help you avoid holding your breath.  Accessory muscle use: You should not see or feel other muscle movement when performing pelvic floor exercises. When done properly, no one can tell that you are performing the exercises. Keep the buttocks, belly and inner thighs relaxed.  Overdoing it: Your muscles can fatigue and stop working for you if you over-exercise. You may actually leak more or feel soreness at the lower abdomen or rectum.  YOUR HOME EXERCISE PROGRAM  LONG HOLDS: Position: on back. seated  Inhale and then exhale. Then squeeze the muscle and count aloud for 3 seconds. Rest with three long breaths. (Be sure to let belly sink in with exhales and not push outward)  Perform 3 repetitions, 3-5 times/day   **ALSO SQUEEZE BEFORE YOUR SNEEZE, COUGH, LAUGH to decrease downward pressure   **ALSO EXHALE  BEFORE YOU RISE AGAINST GRAVITY (lifting, sit to stand, from squat to stand)   _________________  Check recumbent bike at the gym, 7 mins no resistence, 3x week. With stretches after.  Focus on inhale to lengthen spine, exhale lift pelvic floor

## 2015-10-22 NOTE — Therapy (Signed)
[place] MAIN [facility] SERVICES [address] [place], [place], [address] Phone: [phone]   Fax:  [phone]  Physical Therapy Treatment  Patient Details  Name: Sarah Harper MRN: [id number] Date of Birth: Jan 05, 1941 Referring Provider: Angelene Giovanni   Encounter Date: 10/22/2015    Past Medical History  Diagnosis Date  . GERD (gastroesophageal reflux disease)   . Thyroid disease     hypothyroid  . IBS (irritable bowel syndrome)   . ETD (eustachian tube dysfunction)   . Meniere's disease   . Migraine   . DDD (degenerative disc disease)     chronic back pain  . Osteoporosis     Past Surgical History  Procedure Laterality Date  . Lumbar disc surgery  1984    L5   . Appendectomy      There were no vitals filed for this visit.  Visit Diagnosis:  Fascial defect  Pain of left lower extremity      Subjective Assessment - 10/22/15 0909    Subjective Pt reported she saw her PCP about her L knee and R ankle swelling and received xrays to find that her pain was due to arthritis. Currently pt wears a band around her knee when walking. Pt has not been able to porgress beyond .5 mile due to arthritic pain. Pt's radiating pain is less and will subside more quickly after walking. Pt walked into the clinic from parking lot without complaints which is a sign of improvement compared  to previous sessions.    Pertinent History Hx of scars: appendectomy and lumbar    Patient Stated Goals 1)  be able to walk > 0.5 mile at a tolerable level of pain at 4/10, 2) travel in a car > 20-30 min as driver and passenger             [facility] PT Assessment - 10/22/15 1004    Observation/Other Assessments   Observations 5 STS 22 sec, without arm rest (breathholding and knee pain). Cued for body mechanics and reported less knee pain   deep core level 2 (hip abd/ER) 10 reps with burning at RPSIS   Coordination   Coordination and Movement Description able to  perform cat cow (spinal segmental mobility)  without radiating pain    Other:   Other/ Comments recumbent bike (Nu Step) 7' with 3/10 pain at R PSIS "burning" sensation (compared to 6/10 pain after walking)   cued for inhalation to lengthen spine, exhalation for PF M    Transfers   Five time sit to stand comments  22 sec with breathholding, poor body mechanics and pain reported at knee L  proper mechanics, with less pain                      [facility] Adult PT Treatment/Exercise - 10/22/15 1004    Self-Care   Other Self-Care Comments  educated on antomy and pathology and healing , POC for minimizing remaining Sx at R PSIS and building endurance    Therapeutic Activites    Other Therapeutic Activities NU step 7' with cues for deep core    Neuro Re-ed    Neuro Re-ed Details  sit to stand with body mechanics and deep core engaged 5 x, PNF D1 /D2 LE                  PT Education - 10/22/15 1211    Education provided Yes   Education Details HEP, POC  Person(s) Educated Patient   Methods Explanation;Demonstration;Tactile cues;Verbal cues;Handout   Comprehension Verbalized understanding;Returned demonstration             PT Long Term Goals - 10/22/15 0919    PT LONG TERM GOAL #1   Title Pt will decrease her score on ODI from 40% to < 25% in order to return ADLs. (10/01/15: 42%)    Time 12   Period Weeks   Status On-going   PT LONG TERM GOAL #2   Title Pt will report being able to walk > .5 mile at a pain level < 4/10 in order to return to ADLs.   Time 12   Period Weeks   Status Achieved   PT LONG TERM GOAL #3   Title Pt will demo decrease abdominal separation of <2 fingers with ability to lift foot off bed in hooklying to demo increase ability to withstand load and progress towards upright strengthening exercises.    Time 12   Period Weeks   Status Achieved   PT LONG TERM GOAL #4   Title Pt will report being able to tolerate sitting in a car for > 30 min  in order to participate in community events.   Time 12   Period Weeks   Status Achieved   PT LONG TERM GOAL #5   Title Pt will demo decreased time with 5TST (22 sec to < 15 sec with UE support) and increased gait speed from 1.04 m/s to > 1.13 m/s in order to decrease risks for falls and cross streets.  (10/22/15 22 sec on 5FSTs   Time 12   Period Weeks   Status On-going   PT LONG TERM GOAL #6   Title Pt will demo improve pelvic floor strength and endurance with Grade 3/5/5/5 in order to minimize relapse of pain and to build endurance and SIJ stability for walking.    Time 12   Period Weeks   Status On-going   PT LONG TERM GOAL #7   Title Pt will demo BOS that matches the width of her hips with gait across 10 ft w/o cuing.   Time 12   Period Weeks   Status Achieved   PT LONG TERM GOAL #8   Title Pt will report a decrease in rest from 2-3x/day to 2x/day between performing  household chores (putting bed linens on and doing 2 loads of laundry) in order to show improved endurance and pain.    Time 12   Period Weeks   Status Achieved   PT LONG TERM GOAL  #9   TITLE Pt will demo increased hip ext on RLE to 15 deg without lumbar extension in order to walk without radicular pain at long distances.    Time 12   Period Weeks   Status Achieved   PT LONG TERM GOAL  #10   TITLE Pt wil demo decreased paraspinal mm on R across 2 visits in order to demo better management of lumbar mm to decrease radicular pain.    Time 12   Period Weeks   Status Achieved   PT LONG TERM GOAL  #11   TITLE Pt will report no burning sesnsation after burning level 2 exercise for 30 reps in order to prepare for longer distance walking. (10/22/15: 10 reps caused burning)    Time 12   Period Weeks   Status Deferred   PT LONG TERM GOAL  #12   TITLE Pt will be able to walk .5 miles without  pain during and after across 3 days in order to return to her PLOF.    Time 12   Period Weeks   Status Achieved   PT LONG TERM GOAL   #13   TITLE Pt will demo decreased forward head at C6/7 and will report no radiating pain with knees over bolsters after 5 min in order to demo less dural tensions and increase tolerance with greater hip/trunk angle for yoga routine.    Time 12   Period Weeks   Status Achieved   PT LONG TERM GOAL  #14   TITLE Pt to increase PF reps on RLE from 3 reps to 7 reps with UE support on wall   in order to minimize relapse of pain and improve R LE weakness.    Time 12   Period Weeks   Status Achieved   PT LONG TERM GOAL  #15   TITLE Pt will report no aggravation of Sx post-yoga routine HEP in order to return to yoga classes. (including cat/cow)    Time 12   Period Weeks   Status Achieved               Plan - 10/22/15 1212    Clinical Impression Statement Pt continues to progress well with decreased LE radiating pain with walking. Today, pt demo'd ability to perform spinal segmental mobility with cat cow pose without radiating pain and showed improved motor control.  Pt's remaining deficits include arthritis-related pain at pelvic girdle and BLE. Educated on progression of strengthening to fit pt's interests and previous response her past PT Tx,. Discussed the benefits of aquatic Tx to strengthen without load on joints. Pt voiced understanding but did not commit. Plan to progress pelvic floor strengthening to minimize R PSIS arthritic Sx, and continue to use recumbent biking low duration to strengthen LE to minimize knee pain. Pt has decreased freuqnecy to 1x week.     Pt will benefit from skilled therapeutic intervention in order to improve on the following deficits Abnormal gait;Improper body mechanics;Pain;Increased fascial restricitons;Increased muscle spasms;Postural dysfunction;Decreased mobility;Decreased coordination;Decreased scar mobility;Decreased safety awareness;Decreased balance;Decreased activity tolerance;Decreased endurance;Decreased range of motion;Decreased  strength;Hypomobility;Difficulty walking;Impaired flexibility;Impaired perceived functional ability   Rehab Potential Good   PT Frequency 1x / week   PT Duration 12 weeks   PT Treatment/Interventions ADLs/Self Care Home Management;Aquatic Therapy;Cryotherapy;Electrical Stimulation;Biofeedback;Moist Heat;Traction;Gait training;Neuromuscular re-education;Balance training;Therapeutic exercise;Therapeutic activities;Functional mobility training;Stair training;Patient/family education;Scar mobilization;Manual techniques;Taping;Energy conservation;Dry needling;Other (comment)   PT Next Visit Plan PFM training progression   Consulted and Agree with Plan of Care Patient        Problem List Patient Active Problem List   Diagnosis Date Noted  . Right ankle swelling 10/18/2015  . Left knee pain 10/18/2015  . Snoring 10/18/2015  . Routine general medical examination at a health care facility 02/28/2015  . Syncope and collapse 01/20/2015  . Encounter for Medicare annual wellness exam 02/06/2013  . Hyperlipidemia 08/27/2012  . Irregular heart beat 08/05/2012  . Gynecological examination 12/20/2010  . Screening for lipoid disorders 12/08/2010  . Eustachian tube dysfunction 11/29/2010  . BARRETTS ESOPHAGUS 10/01/2010  . Osteopenia 12/17/2009  . BACK PAIN, LUMBAR 07/02/2009  . IRRITABLE BOWEL SYNDROME 04/02/2009  . POSTMENOPAUSAL STATUS 12/10/2008  . Hypothyroidism 12/05/2007  . Meniere's disease 12/05/2007  . GERD 12/05/2007    Jerl Mina  ,PT, DPT, E-RYT  10/22/2015, 12:19 PM  [place] MAIN [facility] SERVICES [address] [place], [place], [address] Phone: [phone]   Fax:  [phone]  Name: ALLANI SHEHAN MRN: [id number] Date of Birth: [birth year]

## 2015-10-28 ENCOUNTER — Ambulatory Visit: Payer: Medicare Other | Admitting: Physical Therapy

## 2015-10-28 DIAGNOSIS — M629 Disorder of muscle, unspecified: Secondary | ICD-10-CM

## 2015-10-28 DIAGNOSIS — R279 Unspecified lack of coordination: Secondary | ICD-10-CM

## 2015-10-28 DIAGNOSIS — M79605 Pain in left leg: Secondary | ICD-10-CM

## 2015-10-28 NOTE — Patient Instructions (Signed)
  Pelvic floor squeezes 3 sec, 3 reps (5 x day) Stretches  Deep core level 3 (marching)  10 x 2 reps / 2 x day     New strengthening:  2 x/day   Bridges without squeezing gluts  And with exhalation   6 reps --> 10 reps    Sidestepping hallway 2 laps each side

## 2015-10-28 NOTE — Therapy (Signed)
[place] MAIN [facility] SERVICES [address] [place], [place], [address] Phone: [phone]   Fax:  [phone]  Physical Therapy Treatment  Patient Details  Name: Sarah Harper MRN: [id number] Date of Birth: 12/21/1940 Referring Provider: Angelene Giovanni   Encounter Date: 10/28/2015      PT End of Session - 10/28/15 0953    Visit Number 17   Authorization Type g-code (7th visit)   PT Start Time 0904   PT Stop Time 0954   PT Time Calculation (min) 50 min      Past Medical History  Diagnosis Date  . GERD (gastroesophageal reflux disease)   . Thyroid disease     hypothyroid  . IBS (irritable bowel syndrome)   . ETD (eustachian tube dysfunction)   . Meniere's disease   . Migraine   . DDD (degenerative disc disease)     chronic back pain  . Osteoporosis     Past Surgical History  Procedure Laterality Date  . Lumbar disc surgery  1984    L5   . Appendectomy      There were no vitals filed for this visit.  Visit Diagnosis:  Fascial defect  Pain of left lower extremity  Lack of coordination      Subjective Assessment - 10/28/15 0912    Subjective (p) Pt reported her LBP has not totally gone away but "it is not an issue all the time". Pt continues to walk every other day 1/2 mile with L Knee brace but the knee pain has increased to 5/10.  After last session of recumbent bike riding for 7 min, pt had not LBP but had swelling in her L knee.    Pertinent History Hx of scars: appendectomy and lumbar    Patient Stated Goals 1)  be able to walk > 0.5 mile at a tolerable level of pain at 4/10, 2) travel in a car > 20-30 min as driver and passenger             [facility] PT Assessment - 10/28/15 0921    Strength   Overall Strength --  L 4-/5 knee extension/flexion, R 5/5 , abduction B 5/5                   Pelvic Floor Special Questions - 10/28/15 0947    Pelvic Floor Internal Exam pt consented verbally without  contraindications   Exam Type Vaginal   Palpation improved coordination , grade 3 of 3rd layer mm.    Strength fair squeeze, definite lift   Strength # of reps 2   Strength # of seconds 5           [facility] Adult PT Treatment/Exercise - 10/28/15 0949    Therapeutic Activites    Other Therapeutic Activities sidestepping 10 ft   bilateral   Neuro Re-ed    Neuro Re-ed Details  bridges with cue to not engage the gluts , knee flexion ~60 deg 6 reps without pulling in gluts                PT Education - 10/28/15 0953    Education provided Yes   Education Details HEP   Person(s) Educated Patient   Methods Explanation;Demonstration;Tactile cues;Verbal cues;Handout   Comprehension Returned demonstration;Verbalized understanding             PT Long Term Goals - 10/28/15 0956    PT LONG TERM GOAL #1   Title Pt will decrease her score on  ODI from 40% to < 25% in order to return ADLs. (10/01/15: 42%)    Time 12   Period Weeks   Status On-going   PT LONG TERM GOAL #2   Title Pt will report being able to walk  .5 mile at a L knee pain level < 3/10 in order to return to ADLs.   Time 12   Period Weeks   Status Revised   PT LONG TERM GOAL #3   Title Pt will demo decrease abdominal separation of <2 fingers with ability to lift foot off bed in hooklying to demo increase ability to withstand load and progress towards upright strengthening exercises.    Time 12   Period Weeks   Status Achieved   PT LONG TERM GOAL #4   Title Pt will report being able to tolerate sitting in a car for > 30 min in order to participate in community events.   Time 12   Period Weeks   Status Achieved   PT LONG TERM GOAL #5   Title Pt will demo decreased time with 5TST (22 sec to < 15 sec with UE support) and increased gait speed from 1.04 m/s to > 1.13 m/s in order to decrease risks for falls and cross streets.  (10/22/15 22 sec on 5FSTs   Time 12   Period Weeks   Status On-going   PT LONG TERM GOAL  #6   Title Pt will demo improve pelvic floor strength and endurance with Grade 3/3/3/3 in order to minimize relapse of pain and to build endurance and SIJ stability for walking.    Time 12   Period Weeks   Status Revised   PT LONG TERM GOAL #7   Title --   Time 12   Period Weeks   Status Achieved   PT LONG TERM GOAL #8   Title Pt will report a decrease in rest from 2-3x/day to 2x/day between performing  household chores (putting bed linens on and doing 2 loads of laundry) in order to show improved endurance and pain.    Time 12   Period Weeks   Status Achieved   PT LONG TERM GOAL  #9   TITLE Pt will demo increased hip ext on RLE to 15 deg without lumbar extension in order to walk without radicular pain at long distances.    Time 12   Period Weeks   Status Achieved   PT LONG TERM GOAL  #10   TITLE Pt wil demo decreased paraspinal mm on R across 2 visits in order to demo better management of lumbar mm to decrease radicular pain.    Time 12   Period Weeks   Status Achieved   PT LONG TERM GOAL  #11   TITLE Pt will report no burning sesnsation after burning level 2 exercise for 30 reps in order to prepare for longer distance walking. (10/22/15: 10 reps caused burning)    Time 12   Period Weeks   Status Deferred   PT LONG TERM GOAL  #12   TITLE Pt will be able to walk .5 miles without pain during and after across 3 days in order to return to her PLOF.    Time 12   Period Weeks   Status Achieved   PT LONG TERM GOAL  #13   TITLE Pt will demo decreased forward head at C6/7 and will report no radiating pain with knees over bolsters after 5 min in order to demo less dural tensions  and increase tolerance with greater hip/trunk angle for yoga routine.    Time 12   Period Weeks   Status Achieved   PT LONG TERM GOAL  #14   TITLE Pt to increase PF reps on RLE from 3 reps to 7 reps with UE support on wall   in order to minimize relapse of pain and improve R LE weakness.    Time 12   Period  Weeks   Status Achieved   PT LONG TERM GOAL  #15   TITLE Pt will report no aggravation of Sx post-yoga routine HEP in order to return to yoga classes. (including cat/cow)    Time 12   Period Weeks   Status Achieved               Plan - 10/28/15 0956    Clinical Impression Statement Pt showed improved pelvic floor coordination with relaxation of mm and Grade 3 strength but was not able to perform 5 sec holds. Degressed to 3 sec,3 reps. Addressed L knee pain with new ther ex to strengthen hip abductor mm and hamstrings with side stepping and hamstrings. Mini squats caused L knee pain after 6 reps. Pt would benefit from further lower kinetich chain strengthening with less loaded positions to accommodate pt's tolerance and manage arthrititic pain. Pt continues to be able to walk 1/2 miles with significantly less LBP but L knee pain is the primary complaint.  Pt will continue to benefit from skilled PT.     Pt will benefit from skilled therapeutic intervention in order to improve on the following deficits Abnormal gait;Improper body mechanics;Pain;Increased fascial restricitons;Increased muscle spasms;Postural dysfunction;Decreased mobility;Decreased coordination;Decreased scar mobility;Decreased safety awareness;Decreased balance;Decreased activity tolerance;Decreased endurance;Decreased range of motion;Decreased strength;Hypomobility;Difficulty walking;Impaired flexibility;Impaired perceived functional ability   Rehab Potential Good   PT Frequency 1x / week   PT Duration 12 weeks   PT Treatment/Interventions ADLs/Self Care Home Management;Aquatic Therapy;Cryotherapy;Electrical Stimulation;Biofeedback;Moist Heat;Traction;Gait training;Neuromuscular re-education;Balance training;Therapeutic exercise;Therapeutic activities;Functional mobility training;Stair training;Patient/family education;Scar mobilization;Manual techniques;Taping;Energy conservation;Dry needling;Other (comment)   PT Next Visit  Plan PFM training progression   Consulted and Agree with Plan of Care Patient        Problem List Patient Active Problem List   Diagnosis Date Noted  . Right ankle swelling 10/18/2015  . Left knee pain 10/18/2015  . Snoring 10/18/2015  . Routine general medical examination at a health care facility 02/28/2015  . Syncope and collapse 01/20/2015  . Encounter for Medicare annual wellness exam 02/06/2013  . Hyperlipidemia 08/27/2012  . Irregular heart beat 08/05/2012  . Gynecological examination 12/20/2010  . Screening for lipoid disorders 12/08/2010  . Eustachian tube dysfunction 11/29/2010  . BARRETTS ESOPHAGUS 10/01/2010  . Osteopenia 12/17/2009  . BACK PAIN, LUMBAR 07/02/2009  . IRRITABLE BOWEL SYNDROME 04/02/2009  . POSTMENOPAUSAL STATUS 12/10/2008  . Hypothyroidism 12/05/2007  . Meniere's disease 12/05/2007  . GERD 12/05/2007    Jerl Mina ,PT, DPT, E-RYT  10/28/2015, 10:02 AM  [place] MAIN [facility] SERVICES [address] [place], [place], [address] Phone: [phone]   Fax:  [phone]  Name: Sarah Harper MRN: [id number] Date of Birth: [birth year]

## 2015-11-05 ENCOUNTER — Ambulatory Visit: Payer: Medicare Other | Admitting: Physical Therapy

## 2015-11-05 ENCOUNTER — Encounter: Payer: Medicare Other | Admitting: Physical Therapy

## 2015-11-05 DIAGNOSIS — R279 Unspecified lack of coordination: Secondary | ICD-10-CM

## 2015-11-05 DIAGNOSIS — M79605 Pain in left leg: Secondary | ICD-10-CM

## 2015-11-05 DIAGNOSIS — M629 Disorder of muscle, unspecified: Secondary | ICD-10-CM

## 2015-11-05 NOTE — Therapy (Signed)
[place] MAIN [facility] SERVICES [address] [place], [place], [address] Phone: [phone]   Fax:  [phone]  Physical Therapy Treatment  Patient Details  Name: Sarah Harper MRN: [id number] Date of Birth: 03-27-1941 Referring Provider: Angelene Giovanni   Encounter Date: 11/05/2015      PT End of Session - 11/05/15 2227    Visit Number 18   Date for PT Re-Evaluation 11/24/15   Authorization Type g-code (8th visit)   PT Start Time 0905   PT Stop Time 1000   PT Time Calculation (min) 55 min      Past Medical History  Diagnosis Date  . GERD (gastroesophageal reflux disease)   . Thyroid disease     hypothyroid  . IBS (irritable bowel syndrome)   . ETD (eustachian tube dysfunction)   . Meniere's disease   . Migraine   . DDD (degenerative disc disease)     chronic back pain  . Osteoporosis     Past Surgical History  Procedure Laterality Date  . Lumbar disc surgery  1984    L5   . Appendectomy      There were no vitals filed for this visit.  Visit Diagnosis:  Fascial defect  Pain of left lower extremity  Lack of coordination      Subjective Assessment - 11/05/15 0908    Subjective Pt has not been able to walk the last 4-5 days but has been doing her HEP.  Pt reported she did walk 3 days w/ brace following last session and then iced it  and the L knee did not swell as much.     Pertinent History Hx of scars: appendectomy and lumbar    Patient Stated Goals 1)  be able to walk > 0.5 mile at a tolerable level of pain at 4/10, 2) travel in a car > 20-30 min as driver and passenger             [facility] PT Assessment - 11/05/15 2222    Palpation   Palpation comment increased mm tensions on L above and below spine of scapula which limited her ability to perform new exercise                     [facility] Adult PT Treatment/Exercise - 11/05/15 2224    Neuro Re-ed    Neuro Re-ed Details  see pt instructions   Exercises    Other Exercises  see pt instructions   Manual Therapy   Soft tissue mobilization L supra/infraspinatus   sustained pressure                PT Education - 11/05/15 2227    Education provided Yes   Education Details HEP   Person(s) Educated Patient   Methods Explanation;Demonstration;Tactile cues;Verbal cues;Handout   Comprehension Returned demonstration;Verbalized understanding             PT Long Term Goals - 11/05/15 2231    PT LONG TERM GOAL #1   Title Pt will decrease her score on ODI from 40% to < 25% in order to return ADLs. (10/01/15: 42%)    Time 12   Period Weeks   Status On-going   PT LONG TERM GOAL #2   Title Pt will report being able to walk  .5 mile at a L knee pain level < 3/10 in order to return to ADLs.   Time 12   Period Weeks   Status Revised   PT  LONG TERM GOAL #3   Title Pt will demo decrease abdominal separation of <2 fingers with ability to lift foot off bed in hooklying to demo increase ability to withstand load and progress towards upright strengthening exercises.    Time 12   Period Weeks   Status Achieved   PT LONG TERM GOAL #4   Title Pt will report being able to tolerate sitting in a car for > 30 min in order to participate in community events.   Time 12   Period Weeks   Status Achieved   PT LONG TERM GOAL #5   Title Pt will demo decreased time with 5TST (22 sec to < 15 sec with UE support) and increased gait speed from 1.04 m/s to > 1.13 m/s in order to decrease risks for falls and cross streets.  (10/22/15 22 sec on 5FSTs   Time 12   Period Weeks   Status On-going   PT LONG TERM GOAL #6   Title Pt will demo improve pelvic floor strength and endurance with Grade 3/3/3/3 in order to minimize relapse of pain and to build endurance and SIJ stability for walking.    Time 12   Period Weeks   Status Revised   PT LONG TERM GOAL #7   Title Pt will demo BOS that matches the width of her hips with gait across 10 ft w/o cuing.   Time 12    Period Weeks   Status Achieved   PT LONG TERM GOAL #8   Title Pt will report a decrease in rest from 2-3x/day to 2x/day between performing  household chores (putting bed linens on and doing 2 loads of laundry) in order to show improved endurance and pain.    Time 12   Period Weeks   Status Achieved   PT LONG TERM GOAL  #9   TITLE Pt will demo increased hip ext on RLE to 15 deg without lumbar extension in order to walk without radicular pain at long distances.    Time 12   Period Weeks   Status Achieved   PT LONG TERM GOAL  #10   TITLE Pt wil demo decreased paraspinal mm on R across 2 visits in order to demo better management of lumbar mm to decrease radicular pain.    Time 12   Period Weeks   Status Achieved   PT LONG TERM GOAL  #11   TITLE Pt will report no burning sesnsation after burning level 2 exercise for 30 reps in order to prepare for longer distance walking. (10/22/15: 10 reps caused burning)    Time 12   Period Weeks   Status Deferred   PT LONG TERM GOAL  #12   TITLE Pt will be able to walk .5 miles without pain during and after across 3 days in order to return to her PLOF.    Time 12   Period Weeks   Status Achieved   PT LONG TERM GOAL  #13   TITLE Pt will demo decreased forward head at C6/7 and will report no radiating pain with knees over bolsters after 5 min in order to demo less dural tensions and increase tolerance with greater hip/trunk angle for yoga routine.    Time 12   Period Weeks   Status Achieved   PT LONG TERM GOAL  #14   TITLE Pt to increase PF reps on RLE from 3 reps to 7 reps with UE support on wall   in order to minimize  relapse of pain and improve R LE weakness.    Time 12   Period Weeks   Status Achieved   PT LONG TERM GOAL  #15   TITLE Pt will report no aggravation of Sx post-yoga routine HEP in order to return to yoga classes. (including cat/cow)    Time 12   Period Weeks   Status Achieved               Plan - 11/05/15 2228     Clinical Impression Statement Pt is progressing well with strengthening exercises. Theraband exercises were withheld as pt demo'd overactivity of upper traps. Modified exercises were selected to strengthen thoracolumbar connection, lower trap/lats, quads, hip abductors/gluts without overloading the knee joint to minimize arthritic pain. Pt will continue to benefit from skilled PT to continue to gain strength and maintain outcomes of previous Tx sessions.    Pt will benefit from skilled therapeutic intervention in order to improve on the following deficits Abnormal gait;Improper body mechanics;Pain;Increased fascial restricitons;Increased muscle spasms;Postural dysfunction;Decreased mobility;Decreased coordination;Decreased scar mobility;Decreased safety awareness;Decreased balance;Decreased activity tolerance;Decreased endurance;Decreased range of motion;Decreased strength;Hypomobility;Difficulty walking;Impaired flexibility;Impaired perceived functional ability   Rehab Potential Good   PT Frequency 1x / week   PT Duration 12 weeks   PT Treatment/Interventions ADLs/Self Care Home Management;Aquatic Therapy;Cryotherapy;Electrical Stimulation;Biofeedback;Moist Heat;Traction;Gait training;Neuromuscular re-education;Balance training;Therapeutic exercise;Therapeutic activities;Functional mobility training;Stair training;Patient/family education;Scar mobilization;Manual techniques;Taping;Energy conservation;Dry needling;Other (comment)   PT Next Visit Plan PFM training progression   Consulted and Agree with Plan of Care Patient        Problem List Patient Active Problem List   Diagnosis Date Noted  . Right ankle swelling 10/18/2015  . Left knee pain 10/18/2015  . Snoring 10/18/2015  . Routine general medical examination at a health care facility 02/28/2015  . Syncope and collapse 01/20/2015  . Encounter for Medicare annual wellness exam 02/06/2013  . Hyperlipidemia 08/27/2012  . Irregular heart beat  08/05/2012  . Gynecological examination 12/20/2010  . Screening for lipoid disorders 12/08/2010  . Eustachian tube dysfunction 11/29/2010  . BARRETTS ESOPHAGUS 10/01/2010  . Osteopenia 12/17/2009  . BACK PAIN, LUMBAR 07/02/2009  . IRRITABLE BOWEL SYNDROME 04/02/2009  . POSTMENOPAUSAL STATUS 12/10/2008  . Hypothyroidism 12/05/2007  . Meniere's disease 12/05/2007  . GERD 12/05/2007    Jerl Mina ,PT, DPT, E-RYT  11/05/2015, 10:33 PM  [place] MAIN [facility] SERVICES [address] [place], [place], [address] Phone: [phone]   Fax:  [phone]  Name: Sarah Harper MRN: [id number] Date of Birth: Jan 04, 1941

## 2015-11-05 NOTE — Patient Instructions (Signed)
Fine tune Bridging  (imaginary pencil under armpit) ... Press full length of arm to bed and four corners of feet before lifting hips    5 x reps / 2 x day   Elbow press by wall (slight knee bend,  four corners of feet down, back of head press to wall)  Exhale pelvic floor lift count to 3 sec holds    High cobra on counter :  (forearms press down, armpit pencils, pelvic tilt, knee of standing leg slightly bent, four corners of feet, then move opp leg out diagonal)    _______________________ Stretches: hugging to stretch back of shoulders or eagle pose arms)  Laying on back, snow angles.

## 2015-11-09 ENCOUNTER — Ambulatory Visit: Payer: Medicare Other | Admitting: Physical Therapy

## 2015-11-10 ENCOUNTER — Ambulatory Visit: Payer: Medicare Other | Admitting: Physical Therapy

## 2015-11-10 DIAGNOSIS — R279 Unspecified lack of coordination: Secondary | ICD-10-CM

## 2015-11-10 DIAGNOSIS — M79605 Pain in left leg: Secondary | ICD-10-CM

## 2015-11-10 DIAGNOSIS — M629 Disorder of muscle, unspecified: Secondary | ICD-10-CM

## 2015-11-10 NOTE — Therapy (Signed)
[place] MAIN [facility] SERVICES [address] [place], [place], [address] Phone: [phone]   Fax:  [phone]  Physical Therapy Treatment  Patient Details  Name: Sarah Harper MRN: [id number] Date of Birth: [birth year] Referring Provider: Angelene Giovanni   Encounter Date: 11/10/2015      PT End of Session - 11/10/15 2143    Visit Number 19   Date for PT Re-Evaluation 11/24/15   Authorization Type g-code (9th visit)   PT Start Time 1400   PT Stop Time 1455   PT Time Calculation (min) 55 min   Activity Tolerance Patient tolerated treatment well;No increased pain   Behavior During Therapy [facility] for tasks assessed/performed      Past Medical History  Diagnosis Date  . GERD (gastroesophageal reflux disease)   . Thyroid disease     hypothyroid  . IBS (irritable bowel syndrome)   . ETD (eustachian tube dysfunction)   . Meniere's disease   . Migraine   . DDD (degenerative disc disease)     chronic back pain  . Osteoporosis     Past Surgical History  Procedure Laterality Date  . Lumbar disc surgery  1984    L5   . Appendectomy      There were no vitals filed for this visit.  Visit Diagnosis:  Fascial defect  Pain of left lower extremity  Lack of coordination      Subjective Assessment - 11/10/15 1412    Subjective Pt reported she woke up with knee stiffness and upper low back tightness (localized) .  PT was able to walk close to 1/2 miles yesterday. Pt continues to have no radiating pain after walking.     Pertinent History Hx of scars: appendectomy and lumbar    Patient Stated Goals 1)  be able to walk > 0.5 mile at a tolerable level of pain at 4/10, 2) travel in a car > 20-30 min as driver and passenger             [facility] PT Assessment - 11/10/15 2142    Observation/Other Assessments   Observations increased paraspinals activation w/ prior HEP, required cuing for posterior tilt of pelvis   modifications required for  previous HEP                     [facility] Adult PT Treatment/Exercise - 11/10/15 2142    Ambulation/Gait   Gait Comments trialed use of exerstrider poles   40 ft   Neuro Re-ed    Neuro Re-ed Details  see pt instructions                PT Education - 11/10/15 2143    Education provided Yes   Education Details HEP   Person(s) Educated Patient   Methods Demonstration;Tactile cues;Verbal cues;Explanation;Handout   Comprehension Returned demonstration;Verbalized understanding             PT Long Term Goals - 11/10/15 1422    PT LONG TERM GOAL #1   Title Pt will decrease her score on ODI from 40% to < 25% in order to return ADLs. (10/01/15: 42%)    Time 12   Period Weeks   Status On-going   PT LONG TERM GOAL #2   Title Pt will report being able to walk  .5 mile at a L knee pain level < 3/10 in order to return to ADLs.   Time 12   Period Weeks   Status Revised  PT LONG TERM GOAL #3   Title Pt will demo decrease abdominal separation of <2 fingers with ability to lift foot off bed in hooklying to demo increase ability to withstand load and progress towards upright strengthening exercises.    Time 12   Period Weeks   Status Achieved   PT LONG TERM GOAL #4   Title Pt will report being able to tolerate sitting in a car for > 30 min in order to participate in community events.   Time 12   Period Weeks   Status Achieved   PT LONG TERM GOAL #5   Title Pt will demo decreased time with 5TST (22 sec to < 15 sec with UE support) and increased gait speed from 1.04 m/s to > 1.13 m/s in order to decrease risks for falls and cross streets.  (10/22/15 22 sec on 5FSTs , 3/22: 14.54 sec with UE support , gait speed 1.2 m/s).     Time 12   Period Weeks   Status Achieved   PT LONG TERM GOAL #6   Title Pt will demo improve pelvic floor strength and endurance with Grade 3/3/3/3 in order to minimize relapse of pain and to build endurance and SIJ stability for walking.    Time  12   Period Weeks   Status Revised   PT LONG TERM GOAL #7   Title Pt will demo BOS that matches the width of her hips with gait across 10 ft w/o cuing.   Time 12   Period Weeks   Status Achieved   PT LONG TERM GOAL #8   Title Pt will report a decrease in rest from 2-3x/day to 2x/day between performing  household chores (putting bed linens on and doing 2 loads of laundry) in order to show improved endurance and pain.    Time 12   Period Weeks   Status Achieved   PT LONG TERM GOAL  #9   TITLE Pt will demo increased hip ext on RLE to 15 deg without lumbar extension in order to walk without radicular pain at long distances.    Time 12   Period Weeks   Status Achieved   PT LONG TERM GOAL  #10   TITLE Pt wil demo decreased paraspinal mm on R across 2 visits in order to demo better management of lumbar mm to decrease radicular pain.    Time 12   Period Weeks   Status Achieved   PT LONG TERM GOAL  #11   TITLE Pt will report no burning sesnsation after burning level 2 exercise for 30 reps in order to prepare for longer distance walking. (10/22/15: 10 reps caused burning)    Time 12   Period Weeks   Status Deferred   PT LONG TERM GOAL  #12   TITLE Pt will be able to walk .5 miles without pain during and after across 3 days in order to return to her PLOF.    Time 12   Period Weeks   Status Achieved   PT LONG TERM GOAL  #13   TITLE Pt will demo decreased forward head at C6/7 and will report no radiating pain with knees over bolsters after 5 min in order to demo less dural tensions and increase tolerance with greater hip/trunk angle for yoga routine.    Time 12   Period Weeks   Status Achieved   PT LONG TERM GOAL  #14   TITLE Pt to increase PF reps on RLE from 3  reps to 7 reps with UE support on wall   in order to minimize relapse of pain and improve R LE weakness.    Time 12   Period Weeks   Status Achieved   PT LONG TERM GOAL  #15   TITLE Pt will report no aggravation of Sx post-yoga  routine HEP in order to return to yoga classes. (including cat/cow)    Time 12   Period Weeks   Status Achieved               Plan - 11/10/15 2214    Clinical Impression Statement Pt required cuing for decreased paraspinal overactivation and propioception of pelvic neutral in previous HEP, requiring deletion of one exercise. Pt was introduced to Exerstriders, walking poles for walking and anticipate this assistive device will help decrease pressure of her arthritic knees which is her remaining problem. Pt continues to not have any radiating LBP. Pt will continue to benefit from skilled PT .   Pt will benefit from skilled therapeutic intervention in order to improve on the following deficits Abnormal gait;Improper body mechanics;Pain;Increased fascial restricitons;Increased muscle spasms;Postural dysfunction;Decreased mobility;Decreased coordination;Decreased scar mobility;Decreased safety awareness;Decreased balance;Decreased activity tolerance;Decreased endurance;Decreased range of motion;Decreased strength;Hypomobility;Difficulty walking;Impaired flexibility;Impaired perceived functional ability   Rehab Potential Good   PT Frequency 1x / week   PT Duration 12 weeks   PT Treatment/Interventions ADLs/Self Care Home Management;Aquatic Therapy;Cryotherapy;Electrical Stimulation;Biofeedback;Moist Heat;Traction;Gait training;Neuromuscular re-education;Balance training;Therapeutic exercise;Therapeutic activities;Functional mobility training;Stair training;Patient/family education;Scar mobilization;Manual techniques;Taping;Energy conservation;Dry needling;Other (comment)   PT Next Visit Plan PFM training progression   Consulted and Agree with Plan of Care Patient        Problem List Patient Active Problem List   Diagnosis Date Noted  . Right ankle swelling 10/18/2015  . Left knee pain 10/18/2015  . Snoring 10/18/2015  . Routine general medical examination at a health care facility  02/28/2015  . Syncope and collapse 01/20/2015  . Encounter for Medicare annual wellness exam 02/06/2013  . Hyperlipidemia 08/27/2012  . Irregular heart beat 08/05/2012  . Gynecological examination 12/20/2010  . Screening for lipoid disorders 12/08/2010  . Eustachian tube dysfunction 11/29/2010  . BARRETTS ESOPHAGUS 10/01/2010  . Osteopenia 12/17/2009  . BACK PAIN, LUMBAR 07/02/2009  . IRRITABLE BOWEL SYNDROME 04/02/2009  . POSTMENOPAUSAL STATUS 12/10/2008  . Hypothyroidism 12/05/2007  . Meniere's disease 12/05/2007  . GERD 12/05/2007    Jerl Mina ,PT, DPT, E-RYT  11/10/2015, 10:19 PM  [place] MAIN [facility] SERVICES [address] [place], [place], [address] Phone: [phone]   Fax:  [phone]  Name: Sarah Harper MRN: [id number] Date of Birth: 09-11-1940

## 2015-11-10 NOTE — Patient Instructions (Signed)
Fine tune Bridging (imaginary pencil under armpit) ... Press full length of arm to bed and four corners of feet before lifting hips    5 x reps / 2 x day   Elbow press by wall (slight knee bend, four corners of feet down, back of head press to wall)  Exhale pelvic floor lift count to 3 sec holds  __added after elbow press, straighten to stand. Use thumb on ribs, index finger on pelvis to ensure relaxed back muscles     DELETE:  High cobra on counter : (forearms press down, armpit pencils, pelvic tilt, knee of standing leg slightly bent, four corners of feet, then move opp leg out diagonal)    Standing hip abduction:  Using counter with one hand for support initially for 1-2 weeks,.  5 repsx 1 set  each leg.     _______________   Using walking poles [email or web address]  When walking to offload knees

## 2015-11-11 ENCOUNTER — Encounter: Payer: Medicare Other | Admitting: Physical Therapy

## 2015-11-19 ENCOUNTER — Ambulatory Visit: Payer: Medicare Other | Attending: Otolaryngology

## 2015-11-19 ENCOUNTER — Ambulatory Visit: Payer: Medicare Other | Admitting: Physical Therapy

## 2015-11-19 DIAGNOSIS — R279 Unspecified lack of coordination: Secondary | ICD-10-CM

## 2015-11-19 DIAGNOSIS — G4733 Obstructive sleep apnea (adult) (pediatric): Secondary | ICD-10-CM | POA: Diagnosis present

## 2015-11-19 DIAGNOSIS — M79605 Pain in left leg: Secondary | ICD-10-CM

## 2015-11-19 DIAGNOSIS — M629 Disorder of muscle, unspecified: Secondary | ICD-10-CM | POA: Diagnosis not present

## 2015-11-19 NOTE — Therapy (Addendum)
[place] MAIN [facility] SERVICES [address] [place], [place], [address] Phone: [phone]   Fax:  [phone]  Physical Therapy Treatment / Progress Note  Patient Details  Name: Sarah Harper MRN: [id number] Date of Birth: 1941/02/10 Referring Provider: Angelene Giovanni   Encounter Date: 11/19/2015      PT End of Session - 11/24/15 0004    Visit Number 20   Date for PT Re-Evaluation 01/24/16   Authorization Type 1   PT Start Time 0900   PT Stop Time 1000   PT Time Calculation (min) 60 min   Activity Tolerance Patient tolerated treatment well;No increased pain   Behavior During Therapy [facility] for tasks assessed/performed      Past Medical History  Diagnosis Date  . GERD (gastroesophageal reflux disease)   . Thyroid disease     hypothyroid  . IBS (irritable bowel syndrome)   . ETD (eustachian tube dysfunction)   . Meniere's disease   . Migraine   . DDD (degenerative disc disease)     chronic back pain  . Osteoporosis     Past Surgical History  Procedure Laterality Date  . Lumbar disc surgery  1984    L5   . Appendectomy      There were no vitals filed for this visit.  Visit Diagnosis:  Fascial defect  Pain of left lower extremity  Lack of coordination      Subjective Assessment - 11/23/15 2359    Subjective  Pt reports her road travel on long roads and stairclimbing triggered radiating LBP and L knee swelling from OA . But  upon return from her family trip, the pain resolved  after 2 days of rest from walking. and self-managed tools.  This is an imporovement because she "just to not ever get over pain after long trips." Pt reported her radiating pain comes on down her RLE wtih increased duration of physical activity but much less in intensity by 25-30% and dissipates more quickly.  The spot at R sacroiliac joint remains painful with any activity including HEP with hip abduction/ER.    Pertinent History Hx of scars: appendectomy  and lumbar    Patient Stated Goals 1)  be able to walk > 0.5 mile at a tolerable level of pain at 4/10, 2) travel in a car > 20-30 min as driver and passenger             [facility] PT Assessment - 11/24/15 0003    Observation/Other Assessments   Observations knee swelling seated, knee at 90 deg, L 5 cm > R   Palpation   Palpation comment abdominal scar with decreased restrictions                  Pelvic Floor Special Questions - 11/23/15 2359    Pelvic Floor Internal Exam pt consented verbally without contraindications   Exam Type Vaginal   Strength good squeeze, good lift, able to hold agaisnt strong resistance   Strength # of reps 3   Strength # of seconds 3   Biofeedback improved strength and coordination           [facility] Adult PT Treatment/Exercise - 11/24/15 0003    Neuro Re-ed    Neuro Re-ed Details  pelvic floor cuing   Manual Therapy   Manual therapy comments manual lymph drainage: abdomen,  neck, armpits, flank, LE bilaterally                 PT  Education - 11/24/15 0003    Education provided Yes   Education Details HEP, return to pelvic floor strengthening   Person(s) Educated Patient   Methods Explanation;Demonstration;Tactile cues;Verbal cues;Handout   Comprehension Verbalized understanding;Returned demonstration;Verbal cues required;Tactile cues required             PT Long Term Goals - 11/24/15 0004    PT LONG TERM GOAL #1   Title Pt will decrease her score on ODI from 40% to < 25% in order to return ADLs. (10/01/15: 42%, 3/31: 30%)    Time 12   Period Weeks   Status Achieved   PT LONG TERM GOAL #2   Title Pt will report being able to walk  .5 mile at a L knee pain level < 3/10 in order to return to ADLs.   Time 12   Period Weeks   Status On-going   PT LONG TERM GOAL #3   Title Pt will demo decrease abdominal separation of <2 fingers with ability to lift foot off bed in hooklying to demo increase ability to withstand load and  progress towards upright strengthening exercises.    Time 12   Period Weeks   Status Achieved   PT LONG TERM GOAL #4   Title Pt will report being able to tolerate sitting in a car for > 30 min in order to participate in community events.   Time 12   Period Weeks   Status Achieved   PT LONG TERM GOAL #5   Title Pt will demo decreased time with 5TST (22 sec to < 15 sec with UE support) and increased gait speed from 1.04 m/s to > 1.13 m/s in order to decrease risks for falls and cross streets.  (10/22/15 22 sec on 5FSTs , 3/22: 14.54 sec with UE support , gait speed 1.2 m/s).     Time 12   Period Weeks   Status Achieved   PT LONG TERM GOAL #6   Title Pt will demo improve pelvic floor strength and endurance with Grade 3/3/3/3 in order to minimize relapse of pain and to build endurance and SIJ stability for walking.    Time 12   Period Weeks   Status On-going   PT LONG TERM GOAL #7   Title Pt will demo BOS that matches the width of her hips with gait across 10 ft w/o cuing.   Time 12   Period Weeks   Status Achieved   PT LONG TERM GOAL #8   Title Pt will report a decrease in rest from 2-3x/day to 2x/day between performing  household chores (putting bed linens on and doing 2 loads of laundry) in order to show improved endurance and pain.    Time 12   Period Weeks   Status Achieved   PT LONG TERM GOAL  #9   TITLE Pt will demo increased hip ext on RLE to 15 deg without lumbar extension in order to walk without radicular pain at long distances.    Time 12   Period Weeks   Status Achieved   PT LONG TERM GOAL  #10   TITLE Pt wil demo decreased paraspinal mm on R across 2 visits in order to demo better management of lumbar mm to decrease radicular pain.    Time 12   Period Weeks   Status Achieved   PT LONG TERM GOAL  #11   TITLE Pt will report no burning sesnsation after burning level 2 exercise for 30 reps in order  to prepare for longer distance walking. (10/22/15: 10 reps caused burning)     Time 12   Period Weeks   Status Deferred   PT LONG TERM GOAL  #12   TITLE Pt will be able to walk .5 miles without pain during and after across 3 days in order to return to her PLOF.    Time 12   Period Weeks   Status Achieved   PT LONG TERM GOAL  #13   TITLE Pt will demo decreased forward head at C6/7 and will report no radiating pain with knees over bolsters after 5 min in order to demo less dural tensions and increase tolerance with greater hip/trunk angle for yoga routine.    Time 12   Period Weeks   Status Achieved   PT LONG TERM GOAL  #14   TITLE Pt to increase PF reps on RLE from 3 reps to 7 reps with UE support on wall   in order to minimize relapse of pain and improve R LE weakness.    Time 12   Period Weeks   Status Achieved   PT LONG TERM GOAL  #15   TITLE Pt will report no aggravation of Sx post-yoga routine HEP in order to return to yoga classes. (including cat/cow)    Time 12   Period Weeks   Status Achieved               Plan - 11/20/15 0012    Clinical Impression Statement Pt has achieved 12/14 goals and is progressing well towards remaining goals related to walking with decreased knee pain. Pt has been able to self-manage sciatic pain with walking and long car rides. Pt has continued to remain complaint. A re-cert is accompanying this note with request for more PT sessions in order to help pt achieve remaining goals.    Pt will benefit from skilled therapeutic intervention in order to improve on the following deficits Abnormal gait;Improper body mechanics;Pain;Increased fascial restricitons;Increased muscle spasms;Postural dysfunction;Decreased mobility;Decreased coordination;Decreased scar mobility;Decreased safety awareness;Decreased balance;Decreased activity tolerance;Decreased endurance;Decreased range of motion;Decreased strength;Hypomobility;Difficulty walking;Impaired flexibility;Impaired perceived functional ability   Rehab Potential Good   PT  Frequency Biweekly  once every 2 weeks   PT Duration 12 weeks   PT Treatment/Interventions ADLs/Self Care Home Management;Aquatic Therapy;Cryotherapy;Electrical Stimulation;Biofeedback;Moist Heat;Traction;Gait training;Neuromuscular re-education;Balance training;Therapeutic exercise;Therapeutic activities;Functional mobility training;Stair training;Patient/family education;Scar mobilization;Manual techniques;Taping;Energy conservation;Dry needling;Other (comment)   Consulted and Agree with Plan of Care Patient        Problem List Patient Active Problem List   Diagnosis Date Noted  . Right ankle swelling 10/18/2015  . Left knee pain 10/18/2015  . Snoring 10/18/2015  . Routine general medical examination at a health care facility 02/28/2015  . Syncope and collapse 01/20/2015  . Encounter for Medicare annual wellness exam 02/06/2013  . Hyperlipidemia 08/27/2012  . Irregular heart beat 08/05/2012  . Gynecological examination 12/20/2010  . Screening for lipoid disorders 12/08/2010  . Eustachian tube dysfunction 11/29/2010  . BARRETTS ESOPHAGUS 10/01/2010  . Osteopenia 12/17/2009  . BACK PAIN, LUMBAR 07/02/2009  . IRRITABLE BOWEL SYNDROME 04/02/2009  . POSTMENOPAUSAL STATUS 12/10/2008  . Hypothyroidism 12/05/2007  . Meniere's disease 12/05/2007  . GERD 12/05/2007    Jerl Mina ,PT, DPT, E-RYT  11/24/2015, 12:13 AM  [place] MAIN [facility] SERVICES [address] [place], [place], [address] Phone: [phone]   Fax:  [phone]  Name: Sarah Harper MRN: [id number] Date of Birth: [birth year]

## 2015-11-24 NOTE — Addendum Note (Signed)
Addended by: Jerl Mina on: 11/24/2015 12:17 AM   Modules accepted: Orders

## 2015-12-06 ENCOUNTER — Ambulatory Visit: Payer: Medicare Other | Attending: Physical Medicine & Rehabilitation | Admitting: Physical Therapy

## 2015-12-06 DIAGNOSIS — R279 Unspecified lack of coordination: Secondary | ICD-10-CM | POA: Insufficient documentation

## 2015-12-06 DIAGNOSIS — M79605 Pain in left leg: Secondary | ICD-10-CM | POA: Diagnosis present

## 2015-12-06 NOTE — Patient Instructions (Signed)
Back pull holding to street post          Warrior with twist to the side of front feet (opp hand or opp thigh)   Also is a calf stretch          Warrior with back knee bent           Hamstring stretch

## 2015-12-07 NOTE — Therapy (Addendum)
[place] MAIN [facility] SERVICES [address] [place], [place], [address] Phone: [phone]   Fax:  [phone]  Physical Therapy Treatment  Patient Details  Name: Sarah Harper MRN: [id number] Date of Birth: 1941-07-19 Referring Provider: Angelene Giovanni   Encounter Date: 12/06/2015      PT End of Session - 12/07/15 1747    Visit Number 21   Date for PT Re-Evaluation 01/24/16   Authorization Type 2   PT Start Time 0915   PT Stop Time 0955   PT Time Calculation (min) 40 min   Activity Tolerance Patient tolerated treatment well   Behavior During Therapy [facility] for tasks assessed/performed      Past Medical History  Diagnosis Date  . GERD (gastroesophageal reflux disease)   . Thyroid disease     hypothyroid  . IBS (irritable bowel syndrome)   . ETD (eustachian tube dysfunction)   . Meniere's disease   . Migraine   . DDD (degenerative disc disease)     chronic back pain  . Osteoporosis     Past Surgical History  Procedure Laterality Date  . Lumbar disc surgery  1984    L5   . Appendectomy      There were no vitals filed for this visit.      Subjective Assessment - 12/07/15 1753    Subjective Pt reported she was able to walk 3/4 mi one time last week with a brief epsiode of pain in the middle of the night . Pt was able to fall back asleep quickly despite pain.  she has been managing her knee pain with a brace while walking and icing post-walk.    Pertinent History Hx of scars: appendectomy and lumbar    Patient Stated Goals 1)  be able to walk > 0.5 mile at a tolerable level of pain at 4/10, 2) travel in a car > 20-30 min as driver and passenger             [facility] PT Assessment - 12/07/15 1746    Strength   Overall Strength --  12 reps of plantarflexion MMT 4/5  R LE with UE on wall                     [facility] Adult PT Treatment/Exercise - 12/07/15 1746    Therapeutic Activites    Other Therapeutic Activities  educationon modifying increased walking endurance to minimize relapse of pain , Guided stretches to perform mid-walk                      PT Long Term Goals - 11/24/15 0004    PT LONG TERM GOAL #1   Title Pt will decrease her score on ODI from 40% to < 25% in order to return ADLs. (10/01/15: 42%, 3/31: 30%)    Time 12   Period Weeks   Status Achieved   PT LONG TERM GOAL #2   Title Pt will report being able to walk  .5 mile at a L knee pain level < 3/10 in order to return to ADLs.   Time 12   Period Weeks   Status On-going   PT LONG TERM GOAL #3   Title Pt will demo decrease abdominal separation of <2 fingers with ability to lift foot off bed in hooklying to demo increase ability to withstand load and progress towards upright strengthening exercises.    Time 12   Period Weeks  Status Achieved   PT LONG TERM GOAL #4   Title Pt will report being able to tolerate sitting in a car for > 30 min in order to participate in community events.   Time 12   Period Weeks   Status Achieved   PT LONG TERM GOAL #5   Title Pt will demo decreased time with 5TST (22 sec to < 15 sec with UE support) and increased gait speed from 1.04 m/s to > 1.13 m/s in order to decrease risks for falls and cross streets.  (10/22/15 22 sec on 5FSTs , 3/22: 14.54 sec with UE support , gait speed 1.2 m/s).     Time 12   Period Weeks   Status Achieved   PT LONG TERM GOAL #6   Title Pt will demo improve pelvic floor strength and endurance with Grade 3/3/3/3 in order to minimize relapse of pain and to build endurance and SIJ stability for walking.    Time 12   Period Weeks   Status On-going   PT LONG TERM GOAL #7   Title Pt will demo BOS that matches the width of her hips with gait across 10 ft w/o cuing.   Time 12   Period Weeks   Status Achieved   PT LONG TERM GOAL #8   Title Pt will report a decrease in rest from 2-3x/day to 2x/day between performing  household chores (putting bed linens on and doing 2  loads of laundry) in order to show improved endurance and pain.    Time 12   Period Weeks   Status Achieved   PT LONG TERM GOAL  #9   TITLE Pt will demo increased hip ext on RLE to 15 deg without lumbar extension in order to walk without radicular pain at long distances.    Time 12   Period Weeks   Status Achieved   PT LONG TERM GOAL  #10   TITLE Pt wil demo decreased paraspinal mm on R across 2 visits in order to demo better management of lumbar mm to decrease radicular pain.    Time 12   Period Weeks   Status Achieved   PT LONG TERM GOAL  #11   TITLE Pt will report no burning sesnsation after burning level 2 exercise for 30 reps in order to prepare for longer distance walking. (10/22/15: 10 reps caused burning)    Time 12   Period Weeks   Status Deferred   PT LONG TERM GOAL  #12   TITLE Pt will be able to walk .5 miles without pain during and after across 3 days in order to return to her PLOF.    Time 12   Period Weeks   Status Achieved   PT LONG TERM GOAL  #13   TITLE Pt will demo decreased forward head at C6/7 and will report no radiating pain with knees over bolsters after 5 min in order to demo less dural tensions and increase tolerance with greater hip/trunk angle for yoga routine.    Time 12   Period Weeks   Status Achieved   PT LONG TERM GOAL  #14   TITLE Pt to increase PF reps on RLE from 3 reps to 7 reps with UE support on wall   in order to minimize relapse of pain and improve R LE weakness.    Time 12   Period Weeks   Status Achieved   PT LONG TERM GOAL  #15   TITLE Pt will report  no aggravation of Sx post-yoga routine HEP in order to return to yoga classes. (including cat/cow)    Time 12   Period Weeks   Status Achieved               Plan - 12/07/15 1748    Clinical Impression Statement Pt is progressing well with good self-management of back and knee pain as she increases her walking endurance from 1/2 mi to 3/4 mile. Pt also showed significant  improvement with plantarflexion strength today (12 reps MMT 4/5 instead of MMT 2/5 at 3 reps in prior visits).  Addressed modification to increased walking endurance routine to minimize relapse of pain. Pt demo'd understanding. Pt will be ready to be d/c at next visit.    Rehab Potential Good   PT Frequency Biweekly  once every 2 weeks   PT Duration 12 weeks   PT Treatment/Interventions ADLs/Self Care Home Management;Aquatic Therapy;Cryotherapy;Electrical Stimulation;Biofeedback;Moist Heat;Traction;Gait training;Neuromuscular re-education;Balance training;Therapeutic exercise;Therapeutic activities;Functional mobility training;Stair training;Patient/family education;Scar mobilization;Manual techniques;Taping;Energy conservation;Dry needling;Other (comment)   Consulted and Agree with Plan of Care Patient      Patient will benefit from skilled therapeutic intervention in order to improve the following deficits and impairments:  Abnormal gait, Improper body mechanics, Pain, Increased fascial restricitons, Increased muscle spasms, Postural dysfunction, Decreased mobility, Decreased coordination, Decreased scar mobility, Decreased safety awareness, Decreased balance, Decreased activity tolerance, Decreased endurance, Decreased range of motion, Decreased strength, Hypomobility, Difficulty walking, Impaired flexibility, Impaired perceived functional ability  Visit Diagnosis: Unspecified lack of coordination  Pain In Left Leg     Problem List Patient Active Problem List   Diagnosis Date Noted  . Right ankle swelling 10/18/2015  . Left knee pain 10/18/2015  . Snoring 10/18/2015  . Routine general medical examination at a health care facility 02/28/2015  . Syncope and collapse 01/20/2015  . Encounter for Medicare annual wellness exam 02/06/2013  . Hyperlipidemia 08/27/2012  . Irregular heart beat 08/05/2012  . Gynecological examination 12/20/2010  . Screening for lipoid disorders 12/08/2010  .  Eustachian tube dysfunction 11/29/2010  . BARRETTS ESOPHAGUS 10/01/2010  . Osteopenia 12/17/2009  . BACK PAIN, LUMBAR 07/02/2009  . IRRITABLE BOWEL SYNDROME 04/02/2009  . POSTMENOPAUSAL STATUS 12/10/2008  . Hypothyroidism 12/05/2007  . Meniere's disease 12/05/2007  . GERD 12/05/2007    Jerl Mina ,PT, DPT, E-RYT  12/07/2015, 5:53 PM  [place] MAIN [facility] SERVICES [address] [place], [place], [address] Phone: [phone]   Fax:  [phone]  Name: Sarah Harper MRN: [id number] Date of Birth: March 22, 1941

## 2015-12-07 NOTE — Addendum Note (Signed)
Addended by: Jerl Mina on: 12/07/2015 10:33 PM   Modules accepted: Orders

## 2015-12-08 ENCOUNTER — Encounter: Payer: Self-pay | Admitting: Podiatry

## 2015-12-08 ENCOUNTER — Ambulatory Visit (INDEPENDENT_AMBULATORY_CARE_PROVIDER_SITE_OTHER): Payer: Medicare Other | Admitting: Podiatry

## 2015-12-08 DIAGNOSIS — D492 Neoplasm of unspecified behavior of bone, soft tissue, and skin: Secondary | ICD-10-CM | POA: Diagnosis not present

## 2015-12-08 DIAGNOSIS — Q828 Other specified congenital malformations of skin: Secondary | ICD-10-CM

## 2015-12-08 NOTE — Progress Notes (Signed)
She presents today with a chief complaint of a painful callus of the left forefoot. She states that he came back way too fast. She was last seen December 2016.  Objective: Vital signs are stable she is alert and oriented 3 pulses remain strongly palpable left foot. Considerable depletion of the forefoot fat pad with porokeratotic lesions and a small cluster beneath the second metatarsal of the left foot. These are painful on palpation without signs of infection.  Assessment: Porokeratosis left foot.  Plan: Chemical debridement after sterile sharp debridement today and I will follow-up with her as needed.

## 2015-12-20 ENCOUNTER — Ambulatory Visit: Payer: Medicare Other | Attending: Physical Medicine & Rehabilitation | Admitting: Physical Therapy

## 2015-12-20 DIAGNOSIS — R279 Unspecified lack of coordination: Secondary | ICD-10-CM | POA: Diagnosis present

## 2015-12-20 DIAGNOSIS — M79605 Pain in left leg: Secondary | ICD-10-CM | POA: Insufficient documentation

## 2015-12-20 NOTE — Therapy (Addendum)
[place] MAIN [facility] SERVICES [address] [place], [place], [address] Phone: [phone]   Fax:  [phone]  Physical Therapy Treatment /Discharge Summary  Patient Details  Name: Sarah Harper MRN: [id number] Date of Birth: February 16, 1941 Referring Provider: Angelene Giovanni   Encounter Date: 12/20/2015      PT End of Session - 12/20/15 2054    Visit Number 22   Date for PT Re-Evaluation 01/24/16   Authorization Type 2   PT Start Time 1010   PT Stop Time 1100   PT Time Calculation (min) 50 min   Activity Tolerance Patient tolerated treatment well   Behavior During Therapy [facility] for tasks assessed/performed      Past Medical History  Diagnosis Date  . GERD (gastroesophageal reflux disease)   . Thyroid disease     hypothyroid  . IBS (irritable bowel syndrome)   . ETD (eustachian tube dysfunction)   . Meniere's disease   . Migraine   . DDD (degenerative disc disease)     chronic back pain  . Osteoporosis     Past Surgical History  Procedure Laterality Date  . Lumbar disc surgery  1984    L5   . Appendectomy      There were no vitals filed for this visit.      Subjective Assessment - 12/20/15 1011    Subjective Pt reported having tried one time to walk 3/4 mi but again experienced burning in her back and the return of radiating pain in the middle of the night. This pain disiipated quickly without interrupting sleep. Pt maintains a regular routine for stretching and strengthening to minimize LBP/ L knee pain and to increase R foot weakness.    Pertinent History Hx of scars: appendectomy and lumbar    Patient Stated Goals 1)  be able to walk > 0.5 mile at a tolerable level of pain at 4/10, 2) travel in a car > 20-30 min as driver and passenger             [facility] PT Assessment - 12/20/15 2051    Observation/Other Assessments   Observations decreased propioception and stability of R foot    Ambulation/Gait   Gait Comments increased  SIJ mobility and upright posture in gait   Balance   Balance Assessed --  UE support for heel toe walking, does not increase pain                     [facility] Adult PT Treatment/Exercise - 12/20/15 2051    Therapeutic Activites    Other Therapeutic Activities balance training                 PT Education - 12/20/15 2054    Education provided Yes   Education Details D/C today and HEP, [place] balance program exercises emailed to pt   [employer]) Educated Patient   Methods Explanation;Demonstration;Tactile cues;Verbal cues;Handout   Comprehension Returned demonstration;Verbalized understanding             PT Long Term Goals - 12/20/15 1015    PT LONG TERM GOAL #1   Title Pt will decrease her score on ODI from 40% to < 25% in order to return ADLs. (10/01/15: 42%, 3/31: 30%)    Time 12   Period Weeks   Status Achieved   PT LONG TERM GOAL #2   Title Pt will report being able to walk  .5 mile at a L knee pain level < 3/10  in order to return to ADLs.   Time 12   Period Weeks   Status Achieved   PT LONG TERM GOAL #3   Title Pt will demo decrease abdominal separation of <2 fingers with ability to lift foot off bed in hooklying to demo increase ability to withstand load and progress towards upright strengthening exercises.    Time 12   Period Weeks   Status Achieved   PT LONG TERM GOAL #4   Title Pt will report being able to tolerate sitting in a car for > 30 min in order to participate in community events.   Time 12   Period Weeks   Status Achieved   PT LONG TERM GOAL #5   Title Pt will demo decreased time with 5TST (22 sec to < 15 sec with UE support) and increased gait speed from 1.04 m/s to > 1.13 m/s in order to decrease risks for falls and cross streets.  (10/22/15 22 sec on 5FSTs , 3/22: 14.54 sec with UE support , gait speed 1.2 m/s).     Time 12   Period Weeks   Status Achieved   PT LONG TERM GOAL #6   Title Pt will demo improve pelvic floor strength and  endurance with Grade 3/3/3/3 in order to minimize relapse of pain and to build endurance and SIJ stability for walking.    Time 12   Period Weeks   Status Achieved   PT LONG TERM GOAL #7   Title Pt will demo BOS that matches the width of her hips with gait across 10 ft w/o cuing.   Time 12   Period Weeks   Status Achieved   PT LONG TERM GOAL #8   Title Pt will report a decrease in rest from 2-3x/day to 2x/day between performing  household chores (putting bed linens on and doing 2 loads of laundry) in order to show improved endurance and pain.    Time 12   Period Weeks   Status Achieved   PT LONG TERM GOAL  #9   TITLE Pt will demo increased hip ext on RLE to 15 deg without lumbar extension in order to walk without radicular pain at long distances.    Time 12   Period Weeks   Status Achieved   PT LONG TERM GOAL  #10   TITLE Pt wil demo decreased paraspinal mm on R across 2 visits in order to demo better management of lumbar mm to decrease radicular pain.    Time 12   Period Weeks   Status Achieved   PT LONG TERM GOAL  #11   TITLE Pt will report no burning sesnsation after burning level 2 exercise for 30 reps in order to prepare for longer distance walking. (10/22/15: 10 reps caused burning)    Time 12   Period Weeks   Status Deferred   PT LONG TERM GOAL  #12   TITLE Pt will be able to walk .5 miles without pain during and after across 3 days in order to return to her PLOF.    Time 12   Period Weeks   Status Achieved   PT LONG TERM GOAL  #13   TITLE Pt will demo decreased forward head at C6/7 and will report no radiating pain with knees over bolsters after 5 min in order to demo less dural tensions and increase tolerance with greater hip/trunk angle for yoga routine.    Time 12   Period Weeks   Status Achieved  PT LONG TERM GOAL  #14   TITLE Pt to increase PF reps on RLE from 3 reps to 7 reps with UE support on wall   in order to minimize relapse of pain and improve R LE weakness.     Time 12   Period Weeks   Status Achieved   PT LONG TERM GOAL  #15   TITLE Pt will report no aggravation of Sx post-yoga routine HEP in order to return to yoga classes. (including cat/cow)    Time 12   Period Weeks   Status Achieved               Plan - 12/30/15 November 30, 2052    Clinical Impression Statement Pt has achieved 14/14 goals across 22 visits that spanned a time frame of 6 months. Pt has now returned to walking for 1/2 mile daily without radiating pain nor LBP  for the past few months. Pt continues to self-manage with her HEP.  Pt's R foot plantarflexion strength  and her arthritic-related pain in her L knee have also improved. Pt is being d/c today and demo'd/voiced understanding on how to continue progressing towards walking 3/4 mi and improving her remaining deficits related to balance and stability/strength of her R foot. Thank you for your referral!      Rehab Potential Good   PT Frequency Biweekly  once every 2 weeks   PT Duration 12 weeks   PT Treatment/Interventions ADLs/Self Care Home Management;Aquatic Therapy;Cryotherapy;Electrical Stimulation;Biofeedback;Moist Heat;Traction;Gait training;Neuromuscular re-education;Balance training;Therapeutic exercise;Therapeutic activities;Functional mobility training;Stair training;Patient/family education;Scar mobilization;Manual techniques;Taping;Energy conservation;Dry needling;Other (comment)   Consulted and Agree with Plan of Care Patient      Patient will benefit from skilled therapeutic intervention in order to improve the following deficits and impairments:  Abnormal gait, Improper body mechanics, Pain, Increased fascial restricitons, Increased muscle spasms, Postural dysfunction, Decreased mobility, Decreased coordination, Decreased scar mobility, Decreased safety awareness, Decreased balance, Decreased activity tolerance, Decreased endurance, Decreased range of motion, Decreased strength, Hypomobility, Difficulty walking,  Impaired flexibility, Impaired perceived functional ability  Visit Diagnosis: Unspecified lack of coordination  Pain In Left Leg       G-Codes - Dec 30, 2015 2104/11/30    Functional Assessment Tool Used clinical judgement ODI   Functional Limitation Mobility: Walking and moving around   Mobility: Walking and Moving Around Current Status [phone]) At least 1 percent but less than 20 percent impaired, limited or restricted   Mobility: Walking and Moving Around Goal Status [phone]) At least 1 percent but less than 20 percent impaired, limited or restricted   Mobility: Walking and Moving Around Discharge Status [phone]) At least 1 percent but less than 20 percent impaired, limited or restricted      Problem List Patient Active Problem List   Diagnosis Date Noted  . Right ankle swelling 10/18/2015  . Left knee pain 10/18/2015  . Snoring 10/18/2015  . Routine general medical examination at a health care facility 02/28/2015  . Syncope and collapse 01/20/2015  . Encounter for Medicare annual wellness exam 02/06/2013  . Hyperlipidemia 08/27/2012  . Irregular heart beat 08/05/2012  . Gynecological examination 30-Dec-2010  . Screening for lipoid disorders 12/08/2010  . Eustachian tube dysfunction 11/29/2010  . BARRETTS ESOPHAGUS 10/01/2010  . Osteopenia 12/17/2009  . BACK PAIN, LUMBAR 07/02/2009  . IRRITABLE BOWEL SYNDROME 04/02/2009  . POSTMENOPAUSAL STATUS 12/10/2008  . Hypothyroidism 12/05/2007  . Meniere's disease 12/05/2007  . GERD 12/05/2007    Jerl Mina ,PT, DPT, E-RYT  Dec 30, 2015, 9:06 PM  [place]  [place] MAIN [facility] SERVICES [place], [place], [address] Phone: [phone]   Fax:  [phone]  Name: Sarah Harper MRN: [id number] Date of Birth: 1940-10-21

## 2015-12-20 NOTE — Patient Instructions (Signed)
Walking backwards (hip width) along counter ( 4 laps)   Heel toe walking (4 laps)   Continue with [facility] to keep R foot strong in balance exercises  Progression of R  heel raises ( hand on rail, heel raises over step so heel may lower slightly below step) x 3 reps

## 2016-01-10 ENCOUNTER — Encounter: Payer: Medicare Other | Admitting: Physical Therapy

## 2016-01-25 ENCOUNTER — Encounter: Payer: Self-pay | Admitting: Primary Care

## 2016-01-25 ENCOUNTER — Ambulatory Visit (INDEPENDENT_AMBULATORY_CARE_PROVIDER_SITE_OTHER): Payer: Medicare Other | Admitting: Primary Care

## 2016-01-25 VITALS — BP 124/72 | HR 75 | Temp 97.3°F | Ht 66.0 in | Wt 134.4 lb

## 2016-01-25 DIAGNOSIS — E039 Hypothyroidism, unspecified: Secondary | ICD-10-CM

## 2016-01-25 DIAGNOSIS — L659 Nonscarring hair loss, unspecified: Secondary | ICD-10-CM

## 2016-01-25 DIAGNOSIS — R5383 Other fatigue: Secondary | ICD-10-CM | POA: Insufficient documentation

## 2016-01-25 LAB — COMPREHENSIVE METABOLIC PANEL
ALT: 12 U/L (ref 0–35)
AST: 17 U/L (ref 0–37)
Albumin: 4.6 g/dL (ref 3.5–5.2)
Alkaline Phosphatase: 85 U/L (ref 39–117)
BUN: 16 mg/dL (ref 6–23)
CO2: 32 mEq/L (ref 19–32)
Calcium: 9.9 mg/dL (ref 8.4–10.5)
Chloride: 99 mEq/L (ref 96–112)
Creatinine, Ser: 0.66 mg/dL (ref 0.40–1.20)
GFR: 92.89 mL/min (ref >60.00)
Glucose, Bld: 92 mg/dL (ref 70–99)
Potassium: 4.2 mEq/L (ref 3.5–5.1)
Sodium: 136 mEq/L (ref 135–145)
Total Bilirubin: 0.5 mg/dL (ref 0.2–1.2)
Total Protein: 7.4 g/dL (ref 6.0–8.3)

## 2016-01-25 LAB — CBC
HCT: 42.2 % (ref 36.0–46.0)
Hemoglobin: 13.8 g/dL (ref 12.0–15.0)
MCHC: 32.7 g/dL (ref 30.0–36.0)
MCV: 88.4 fl (ref 78.0–100.0)
Platelets: 269 10*3/uL (ref 150.0–400.0)
RBC: 4.77 Mil/uL (ref 3.87–5.11)
RDW: 14 % (ref 11.5–15.5)
WBC: 7.2 10*3/uL (ref 4.0–10.5)

## 2016-01-25 LAB — VITAMIN D 25 HYDROXY (VIT D DEFICIENCY, FRACTURES): VITD: 31.63 ng/mL (ref 30.00–100.00)

## 2016-01-25 LAB — VITAMIN B12: Vitamin B-12: 432 pg/mL (ref 211–911)

## 2016-01-25 LAB — TSH: TSH: 1.7 u[IU]/mL (ref 0.35–4.50)

## 2016-01-25 NOTE — Progress Notes (Signed)
Pre visit review using our clinic review tool, if applicable. No additional management support is needed unless otherwise documented below in the visit note. 

## 2016-01-25 NOTE — Progress Notes (Signed)
Subjective:    Patient ID: Sarah Harper, female    DOB: 03-19-1941, 75 y.o.   MRN: [id number]  HPI  [name] is a 75 year old female who presents today with a chief complaint of fatigue. Her fatigue has been ongoing for years. She has a history of hypothyroidism and is currently managed on levothyroxine 88 mcg. Her last TSH was normal in June of 2016. She also has a history of sleep apnea and is maintained on a CPAP machine at home. Since her diagnosis of OSA and treatment with CPAP, she's not noticed improvement in fatigue. She has had follow-up with pulmonology and settings were determined to be stable. She doesn't feel well rested when she wakes up in the morning.  She also reports thinning to her hair that has been present for years, but worse over the past 3-4 months. Denies cold intolerance, changes in appetite, unexplained weight loss, depression, urinary symptoms, cough, chest pain, lower extremity swelling.  She's unable to shop at the grocery and departmental stores as long as she was once able to do so without feeling fatigued.  Review of Systems  Constitutional: Positive for fatigue. Negative for appetite change and unexpected weight change.  Respiratory: Negative for cough and shortness of breath.   Cardiovascular: Negative for chest pain, palpitations and leg swelling.  Gastrointestinal: Negative for abdominal pain.  Endocrine: Negative for cold intolerance.  Genitourinary: Negative for dysuria, hematuria and flank pain.  Neurological: Negative for dizziness and headaches.       Past Medical History  Diagnosis Date  . GERD (gastroesophageal reflux disease)   . Thyroid disease     hypothyroid  . IBS (irritable bowel syndrome)   . ETD (eustachian tube dysfunction)   . Meniere's disease   . Migraine   . DDD (degenerative disc disease)     chronic back pain  . Osteoporosis      Social History   Social History  . Marital Status: Married    Spouse Name: N/A  .  Number of Children: 0  . Years of Education: N/A   Occupational History  . Retired    Social History Main Topics  . Smoking status: Never Smoker   . Smokeless tobacco: Not on file  . Alcohol Use: 0.6 oz/week    1 Glasses of wine per week     Comment: wine occasional  . Drug Use: No  . Sexual Activity: Not on file   Other Topics Concern  . Not on file   Social History Narrative    Past Surgical History  Procedure Laterality Date  . Lumbar disc surgery  1984    L5   . Appendectomy      Family History  Problem Relation Age of Onset  . Diabetes Paternal Aunt   . Hypertension Maternal Grandmother   . Hypertension Maternal Grandfather     Allergies  Allergen Reactions  . Epinephrine     Current Outpatient Prescriptions on File Prior to Visit  Medication Sig Dispense Refill  . butalbital-acetaminophen-caffeine (FIORICET) 50-325-40 MG per tablet Take 1 tablet by mouth every 6 (six) hours as needed. 90 tablet 3  . Calcium Carb-Cholecalciferol (CALCIUM 600 + D PO) Take 1 tablet by mouth daily.    Marland Kitchen dexlansoprazole (DEXILANT) 60 MG capsule Take 1 capsule (60 mg total) by mouth daily. 90 capsule 3  . levothyroxine (SYNTHROID, LEVOTHROID) 88 MCG tablet Take 1 tablet (88 mcg total) by mouth daily before breakfast. 90 tablet 3  .  lidocaine (XYLOCAINE) 5 % ointment Apply 1 application topically as needed. 150 g 3  . Linaclotide (LINZESS) 145 MCG CAPS capsule Take 145 mcg by mouth daily.    Marland Kitchen PREMARIN vaginal cream APPLY SMALL AMOUNT VAGINALLY 2 TO 3 TIMES A WEEK 90 g 1  . pseudoephedrine (SUDAFED) 60 MG tablet Take 60 mg by mouth daily.     . traMADol (ULTRAM) 50 MG tablet Take 50 mg by mouth as needed for pain. Reported on 08/05/2015     No current facility-administered medications on file prior to visit.    BP 124/72 mmHg  Pulse 75  Temp(Src) 97.3 F (36.3 C) (Oral)  Ht 5\' 6"  (1.676 m)  Wt 134 lb 6.4 oz (60.963 kg)  BMI 21.70 kg/m2  SpO2 97%    Objective:    Physical Exam  Constitutional: She is oriented to person, place, and time. She appears well-nourished. She does not appear ill.  Eyes: Pupils are equal, round, and reactive to light.  Neck: Neck supple.  Cardiovascular: Normal rate, regular rhythm and normal heart sounds.   Pulmonary/Chest: Effort normal and breath sounds normal.  Abdominal: Soft.  Neurological: She is alert and oriented to person, place, and time. No cranial nerve deficit.  Skin: Skin is warm and dry.  Psychiatric: She has a normal mood and affect.          Assessment & Plan:

## 2016-01-25 NOTE — Patient Instructions (Signed)
Complete lab work prior to leaving today. I will notify you of your results once received.   It was a pleasure to see you today!  

## 2016-01-25 NOTE — Assessment & Plan Note (Signed)
Gradually ongoing for years. Recently with thinning of the hair over the last 3-4 months. No evidence of depression, urinary tract infection, respiratory involvement.  Exam unremarkable.  Echocardiogram with EF of 60-65% from July 2016.  Will check TSH given it has been one year since recheck, also check vitamin B12, vitamin D, CBC, CMP. Patient has follow-up scheduled with PCP in several weeks. We will encourage her to mention symptoms to PCP at that time. Labs pending.

## 2016-03-06 ENCOUNTER — Ambulatory Visit (INDEPENDENT_AMBULATORY_CARE_PROVIDER_SITE_OTHER): Payer: Medicare Other | Admitting: Family Medicine

## 2016-03-06 ENCOUNTER — Encounter: Payer: Self-pay | Admitting: Family Medicine

## 2016-03-06 VITALS — BP 118/64 | HR 76 | Temp 98.2°F | Ht 66.0 in | Wt 135.2 lb

## 2016-03-06 DIAGNOSIS — Z0000 Encounter for general adult medical examination without abnormal findings: Secondary | ICD-10-CM | POA: Diagnosis not present

## 2016-03-06 DIAGNOSIS — G4733 Obstructive sleep apnea (adult) (pediatric): Secondary | ICD-10-CM

## 2016-03-06 DIAGNOSIS — K2100 Gastro-esophageal reflux disease with esophagitis, without bleeding: Secondary | ICD-10-CM

## 2016-03-06 DIAGNOSIS — E039 Hypothyroidism, unspecified: Secondary | ICD-10-CM | POA: Diagnosis not present

## 2016-03-06 DIAGNOSIS — E785 Hyperlipidemia, unspecified: Secondary | ICD-10-CM

## 2016-03-06 DIAGNOSIS — M8580 Other specified disorders of bone density and structure, unspecified site: Secondary | ICD-10-CM | POA: Diagnosis not present

## 2016-03-06 MED ORDER — DEXLANSOPRAZOLE 60 MG PO CPDR: 60.0000 mg | DELAYED_RELEASE_CAPSULE | Freq: Every day | ORAL | Status: DC

## 2016-03-06 MED ORDER — LEVOTHYROXINE SODIUM 88 MCG PO TABS: 88.0000 ug | ORAL_TABLET | Freq: Every day | ORAL | Status: DC

## 2016-03-06 MED ORDER — LINACLOTIDE 145 MCG PO CAPS: 145.0000 ug | ORAL_CAPSULE | Freq: Every day | ORAL | Status: DC

## 2016-03-06 MED ORDER — BUTALBITAL-APAP-CAFFEINE 50-325-40 MG PO TABS: 1.0000 | ORAL_TABLET | Freq: Four times a day (QID) | ORAL | Status: DC | PRN

## 2016-03-06 MED ORDER — ESTROGENS, CONJUGATED 0.625 MG/GM VA CREA: TOPICAL_CREAM | VAGINAL | Status: DC

## 2016-03-06 NOTE — Patient Instructions (Addendum)
Call us when you are ready to schedule bone density test  Don't forget to get a mammogram Take care of yourself  Keep doing yoga  Make sure to drink enough fluids

## 2016-03-06 NOTE — Assessment & Plan Note (Signed)
With Barretts esophagus-sees Dr Allyn Kenner Continues good diet and dexilant Continue GI f/u s

## 2016-03-06 NOTE — Assessment & Plan Note (Signed)
Due for dexa Will call when ready to schedule it  No falls or fx On ca and D  Enc continued exercise

## 2016-03-06 NOTE — Progress Notes (Signed)
Pre visit review using our clinic review tool, if applicable. No additional management support is needed unless otherwise documented below in the visit note. 

## 2016-03-06 NOTE — Assessment & Plan Note (Signed)
Disc goals for lipids and reasons to control them Rev labs with pt Rev low sat fat diet in detail  Well controlled with diet 

## 2016-03-06 NOTE — Assessment & Plan Note (Signed)
Doing better with cpap - hoping to get more energy back

## 2016-03-06 NOTE — Assessment & Plan Note (Signed)
Hypothyroidism  Pt has no clinical changes No change in energy level/ hair or skin/ edema and no tremor Lab Results  Component Value Date   TSH 1.70 01/25/2016

## 2016-03-06 NOTE — Assessment & Plan Note (Signed)
Reviewed health habits including diet and exercise and skin cancer prevention Reviewed appropriate screening tests for age  Also reviewed health mt list, fam hx and immunization status , as well as social and family history   See HPI Labs reviewed Call us when you are ready to schedule bone density test  Don't forget to get a mammogram Take care of yourself  Keep doing yoga  Make sure to drink enough fluids

## 2016-03-06 NOTE — Progress Notes (Signed)
Subjective:    Patient ID: Sarah Harper, female    DOB: 02-27-1941, 75 y.o.   MRN: [id number]  HPI Here for f/u of chronic medical problems   Was dx with OSA - she has been using cpap for 3 mo and doing much better Sees ENT Dr Virgia Land  Having less episodes  Waiting for energy to come back - and still nods off during the day   Wt Readings from Last 3 Encounters:  03/06/16 135 lb 4 oz (61.349 kg)  01/25/16 134 lb 6.4 oz (60.963 kg)  10/18/15 139 lb 8 oz (63.277 kg)   bmi is 21  Due for mammogram soon- she will set that up  No lumps on self exam    Vit D level is 31 dexa  7/13= mild osteopenia  Takes her ca and D  Will call when ready to schedule  Does yoga  No falls or fractures   Due for mammogram this month-she will schedule that   Colonoscopy 2009  Hypothyroidism  Pt has no clinical changes No change in energy level/ hair or skin/ edema and no tremor Lab Results  Component Value Date   TSH 1.70 01/25/2016     Hx of hyperlipidemia Lab Results  Component Value Date   CHOL 189 03/01/2015   CHOL 221* 02/17/2014   CHOL 222* 02/12/2013   Lab Results  Component Value Date   HDL 70.30 03/01/2015   HDL 103.80 02/17/2014   HDL 89.00 02/12/2013   Lab Results  Component Value Date   LDLCALC 104* 03/01/2015   [place] 99 02/17/2014   Lab Results  Component Value Date   TRIG 72.0 03/01/2015   TRIG 93.0 02/17/2014   TRIG 77.0 02/12/2013   Lab Results  Component Value Date   CHOLHDL 3 03/01/2015   CHOLHDL 2 02/17/2014   CHOLHDL 2 02/12/2013   Lab Results  Component Value Date   LDLDIRECT 115.5 02/12/2013   LDLDIRECT 109.9 12/19/2010   LDLDIRECT 117.7 12/17/2009   overall - very well controlled   Takes dexilant for gerd with hx of barretts  Sees Dr Allyn Kenner -follows closely  Also drinking hardly any wine   Patient Active Problem List   Diagnosis Date Noted  . OSA (obstructive sleep apnea) 03/06/2016  . Fatigue 01/25/2016  . Right ankle swelling  10/18/2015  . Left knee pain 10/18/2015  . Snoring 10/18/2015  . Routine general medical examination at a health care facility 02/28/2015  . Syncope and collapse 01/20/2015  . Encounter for Medicare annual wellness exam 02/06/2013  . Hyperlipidemia 08/27/2012  . Irregular heart beat 08/05/2012  . Gynecological examination 12/20/2010  . Screening for lipoid disorders 12/08/2010  . Eustachian tube dysfunction 11/29/2010  . BARRETTS ESOPHAGUS 10/01/2010  . Osteopenia 12/17/2009  . BACK PAIN, LUMBAR 07/02/2009  . IRRITABLE BOWEL SYNDROME 04/02/2009  . POSTMENOPAUSAL STATUS 12/10/2008  . Hypothyroidism 12/05/2007  . Meniere's disease 12/05/2007  . GERD 12/05/2007   Past Medical History  Diagnosis Date  . GERD (gastroesophageal reflux disease)   . Thyroid disease     hypothyroid  . IBS (irritable bowel syndrome)   . ETD (eustachian tube dysfunction)   . Meniere's disease   . Migraine   . DDD (degenerative disc disease)     chronic back pain  . Osteoporosis    Past Surgical History  Procedure Laterality Date  . Lumbar disc surgery  1984    L5   . Appendectomy     Social History  Substance Use Topics  . Smoking status: Never Smoker   . Smokeless tobacco: Never Used  . Alcohol Use: 0.6 oz/week    1 Glasses of wine per week     Comment: wine occasional   Family History  Problem Relation Age of Onset  . Diabetes Paternal Aunt   . Hypertension Maternal Grandmother   . Hypertension Maternal Grandfather    Allergies  Allergen Reactions  . Epinephrine    Current Outpatient Prescriptions on File Prior to Visit  Medication Sig Dispense Refill  . Calcium Carb-Cholecalciferol (CALCIUM 600 + D PO) Take 1 tablet by mouth daily.    Marland Kitchen lidocaine (XYLOCAINE) 5 % ointment Apply 1 application topically as needed. 150 g 3  . mometasone (NASONEX) 50 MCG/ACT nasal spray Place 2 sprays into the nose daily as needed.    . Multiple Vitamin (MULTI VITAMIN PO) Multi Vitamin with Biotin      . pseudoephedrine (SUDAFED) 60 MG tablet Take 60 mg by mouth daily.     . traMADol (ULTRAM) 50 MG tablet Take 50 mg by mouth as needed for pain. Reported on 08/05/2015     No current facility-administered medications on file prior to visit.    Review of Systems Review of Systems  Constitutional: Negative for fever, appetite change, fatigue and unexpected weight change.  Eyes: Negative for pain and visual disturbance.  Respiratory: Negative for cough and shortness of breath.   Cardiovascular: Negative for cp or palpitations    Gastrointestinal: Negative for nausea, diarrhea and constipation.  Genitourinary: Negative for urgency and frequency.  Skin: Negative for pallor or rash   Neurological: Negative for weakness, light-headedness, numbness and headaches.  Hematological: Negative for adenopathy. Does not bruise/bleed easily.  Psychiatric/Behavioral: Negative for dysphoric mood. The patient is not nervous/anxious.         Objective:   Physical Exam  Constitutional: She appears well-developed and well-nourished. No distress.  Well appearing   HENT:  Head: Normocephalic and atraumatic.  Right Ear: External ear normal.  Left Ear: External ear normal.  Mouth/Throat: Oropharynx is clear and moist.  Eyes: Conjunctivae and EOM are normal. Pupils are equal, round, and reactive to light. No scleral icterus.  Neck: Normal range of motion. Neck supple. No JVD present. Carotid bruit is not present. No thyromegaly present.  Cardiovascular: Normal rate, regular rhythm, normal heart sounds and intact distal pulses.  Exam reveals no gallop.   Pulmonary/Chest: Effort normal and breath sounds normal. No respiratory distress. She has no wheezes. She exhibits no tenderness.  Abdominal: Soft. Bowel sounds are normal. She exhibits no distension, no abdominal bruit and no mass. There is no tenderness.  Genitourinary: No breast swelling, tenderness, discharge or bleeding.  Breast exam: No mass, nodules,  thickening, tenderness, bulging, retraction, inflamation, nipple discharge or skin changes noted.  No axillary or clavicular LA.      Musculoskeletal: Normal range of motion. She exhibits no edema or tenderness.  No kyphosis or scoliosis   Lymphadenopathy:    She has no cervical adenopathy.  Neurological: She is alert. She has normal reflexes. No cranial nerve deficit. She exhibits normal muscle tone. Coordination normal.  Skin: Skin is warm and dry. No rash noted. No erythema. No pallor.  Psychiatric: She has a normal mood and affect.          Assessment & Plan:

## 2016-03-14 ENCOUNTER — Telehealth: Payer: Self-pay

## 2016-03-14 MED ORDER — ALPRAZOLAM 0.25 MG PO TABS: 0.2500 mg | ORAL_TABLET | Freq: Two times a day (BID) | ORAL | 0 refills | Status: DC | PRN

## 2016-03-14 NOTE — Telephone Encounter (Signed)
Pt left v/m; pts 75 yr old mother fell and pt is going to have to help take care of her mother. Pt request mild sedative for a few days to help pt calm down while taking care of her mother. Last annual exam on 03/06/16. [facility]. Pt request cb.

## 2016-03-14 NOTE — Telephone Encounter (Signed)
Left detailed message on voicemail.  Medication phoned to pharmacy.  

## 2016-03-14 NOTE — Telephone Encounter (Signed)
We can try xanax with caution of habit (addiction) so do not use regularly  Watch out for sedation Do not drive with it  Do not drink alcohol with it  Stop it if it sedates you too much   Good luck with everything  Px written for call in

## 2016-04-04 ENCOUNTER — Ambulatory Visit: Payer: Medicare Other | Admitting: Internal Medicine

## 2016-04-12 ENCOUNTER — Encounter: Payer: Self-pay | Admitting: Podiatry

## 2016-04-12 ENCOUNTER — Ambulatory Visit (INDEPENDENT_AMBULATORY_CARE_PROVIDER_SITE_OTHER): Payer: Medicare Other | Admitting: Podiatry

## 2016-04-12 DIAGNOSIS — Q828 Other specified congenital malformations of skin: Secondary | ICD-10-CM | POA: Diagnosis not present

## 2016-04-12 NOTE — Progress Notes (Signed)
She presents today chief complaint of painful lesion plantar aspect of the left forefoot.  Objective: Vital signs are stable alert and oriented 3. Pulses are palpable. No open lesions or wounds poor keratoma is present. Was debrided today alleviated her symptoms.  Assessment porokeratosis left.  Plan: Debrided reactive hyperkeratosis salicylic acid under occlusion will follow up with her in 4 months

## 2016-04-24 ENCOUNTER — Encounter: Payer: Self-pay | Admitting: Family Medicine

## 2016-04-25 MED ORDER — LINACLOTIDE 145 MCG PO CAPS: 145.0000 ug | ORAL_CAPSULE | Freq: Every day | ORAL | 3 refills | Status: DC

## 2016-04-25 NOTE — Telephone Encounter (Signed)
Will refill electronically  

## 2016-05-11 ENCOUNTER — Telehealth: Payer: Self-pay | Admitting: Family Medicine

## 2016-05-11 ENCOUNTER — Other Ambulatory Visit: Payer: Self-pay | Admitting: Family Medicine

## 2016-05-11 DIAGNOSIS — E2839 Other primary ovarian failure: Secondary | ICD-10-CM | POA: Insufficient documentation

## 2016-05-11 DIAGNOSIS — Z1231 Encounter for screening mammogram for malignant neoplasm of breast: Secondary | ICD-10-CM | POA: Insufficient documentation

## 2016-05-11 NOTE — Telephone Encounter (Signed)
Ref done  

## 2016-05-11 NOTE — Telephone Encounter (Signed)
Patient is requesting that you place an order for her Bone Density and Mammogram at the [facility]. She will call them on Tuesday of next week to schedule them.

## 2016-05-12 ENCOUNTER — Ambulatory Visit (INDEPENDENT_AMBULATORY_CARE_PROVIDER_SITE_OTHER): Payer: Medicare Other

## 2016-05-12 DIAGNOSIS — Z23 Encounter for immunization: Secondary | ICD-10-CM

## 2016-06-29 ENCOUNTER — Other Ambulatory Visit: Payer: Medicare Other

## 2016-06-29 ENCOUNTER — Ambulatory Visit: Payer: Medicare Other

## 2016-07-27 ENCOUNTER — Ambulatory Visit
Admission: RE | Admit: 2016-07-27 | Discharge: 2016-07-27 | Disposition: A | Discharge status: Home / Self Care | Payer: Medicare Other | Source: Ambulatory Visit | Attending: Family Medicine | Admitting: Family Medicine

## 2016-07-27 DIAGNOSIS — E2839 Other primary ovarian failure: Secondary | ICD-10-CM

## 2016-07-27 DIAGNOSIS — Z1231 Encounter for screening mammogram for malignant neoplasm of breast: Secondary | ICD-10-CM

## 2016-08-09 ENCOUNTER — Ambulatory Visit: Payer: Medicare Other | Admitting: Podiatry

## 2016-08-21 HISTORY — PX: HEMORRHOID BANDING: SHX5850

## 2016-08-24 ENCOUNTER — Ambulatory Visit (INDEPENDENT_AMBULATORY_CARE_PROVIDER_SITE_OTHER): Payer: Medicare Other | Admitting: Internal Medicine

## 2016-08-24 ENCOUNTER — Encounter: Payer: Self-pay | Admitting: Internal Medicine

## 2016-08-24 VITALS — BP 120/76 | HR 89 | Temp 97.8°F | Wt 135.0 lb

## 2016-08-24 DIAGNOSIS — R350 Frequency of micturition: Secondary | ICD-10-CM | POA: Diagnosis not present

## 2016-08-24 DIAGNOSIS — R3915 Urgency of urination: Secondary | ICD-10-CM | POA: Diagnosis not present

## 2016-08-24 DIAGNOSIS — N3010 Interstitial cystitis (chronic) without hematuria: Secondary | ICD-10-CM

## 2016-08-24 LAB — POC URINALSYSI DIPSTICK (AUTOMATED)
Bilirubin, UA: NEGATIVE
Blood, UA: NEGATIVE
Glucose, UA: NEGATIVE
Ketones, UA: NEGATIVE
Leukocytes, UA: NEGATIVE
Nitrite, UA: NEGATIVE
Protein, UA: NEGATIVE
Spec Grav, UA: 1.025
Urobilinogen, UA: NEGATIVE
pH, UA: 6

## 2016-08-24 NOTE — Progress Notes (Signed)
HPI  Pt presents to the clinic today with c/o urinary urgency and frequency. This started 2 weeks ago. She feels like her bladder is not emptying fully. She denies fever, chills or body aches. She denies vaginal complaints. She has not tried anything OTC for this.    Review of Systems  Past Medical History:  Diagnosis Date  . DDD (degenerative disc disease)    chronic back pain  . ETD (eustachian tube dysfunction)   . GERD (gastroesophageal reflux disease)   . IBS (irritable bowel syndrome)   . Meniere's disease   . Migraine   . Osteoporosis   . Thyroid disease    hypothyroid    Family History  Problem Relation Age of Onset  . Diabetes Paternal Aunt   . Hypertension Maternal Grandmother   . Hypertension Maternal Grandfather     Social History   Social History  . Marital status: Married    Spouse name: N/A  . Number of children: 0  . Years of education: N/A   Occupational History  . Retired    Social History Main Topics  . Smoking status: Never Smoker  . Smokeless tobacco: Never Used  . Alcohol use 0.6 oz/week    1 Glasses of wine per week     Comment: wine occasional  . Drug use: No  . Sexual activity: Not on file   Other Topics Concern  . Not on file   Social History Narrative  . No narrative on file    Allergies  Allergen Reactions  . Epinephrine      Constitutional: Denies fever, malaise, fatigue, headache or abrupt weight changes.   GU: Pt reports urgency, frequency. Denies dysuria, burning sensation, blood in urine, odor or discharge. Skin: Denies redness, rashes, lesions or ulcercations.   No other specific complaints in a complete review of systems (except as listed in HPI above).    Objective:   Physical Exam  BP 120/76   Pulse 89   Temp 97.8 F (36.6 C) (Oral)   Wt 135 lb (61.2 kg)   SpO2 99%   BMI 21.79 kg/m   Wt Readings from Last 3 Encounters:  08/24/16 135 lb (61.2 kg)  03/06/16 135 lb 4 oz (61.3 kg)  01/25/16 134 lb 6.4  oz (61 kg)    General: Appears her stated age, well developed, well nourished in NAD. Abdomen: Soft. Normal bowel sounds. No distention or masses noted.  Tender to palpation over the bladder area. No CVA tenderness.       Assessment & Plan:   Urgency, Frequency secondary to IC:  Urinalysis: normal Avoid caffeine or other bladder irritants (handout given) Drink plenty of fluids  RTC as needed or if symptoms persist. Webb Silversmith, NP

## 2016-08-24 NOTE — Patient Instructions (Signed)
Interstitial Cystitis Introduction Interstitial cystitis is a condition that causes inflammation of the bladder. The bladder is a hollow organ in the lower part of your abdomen. It stores urine after the urine is made by your kidneys. With interstitial cystitis, you may have pain in the bladder area. You may also have a frequent and urgent need to urinate. The severity of interstitial cystitis can vary from person to person. You may have flare-ups of the condition, and then it may go away for a while. For many people who have this condition, it becomes a long-term problem. What are the causes? The cause of this condition is not known. What increases the risk? This condition is more likely to develop in women. What are the signs or symptoms? Symptoms of interstitial cystitis vary, and they can change over time. Symptoms may include:  Discomfort or pain in the bladder area. This can range from mild to severe. The pain may change in intensity as the bladder fills with urine or as it empties.  Pelvic pain.  An urgent need to urinate.  Frequent urination.  Pain during sexual intercourse.  Pinpoint bleeding on the bladder wall. For women, the symptoms often get worse during menstruation. How is this diagnosed? This condition is diagnosed by evaluating your symptoms and ruling out other causes. A physical exam will be done. Various tests may be done to rule out other conditions. Common tests include:  Urine tests.  Cystoscopy. In this test, a tool that is like a very thin telescope is used to look into your bladder.  Biopsy. This involves taking a sample of tissue from the bladder wall to be examined under a microscope. How is this treated? There is no cure for interstitial cystitis, but treatment methods are available to control your symptoms. Work closely with your health care provider to find the treatments that will be most effective for you. Treatment options may include:  Medicines  to relieve pain and to help reduce the number of times that you feel the need to urinate.  Bladder training. This involves learning ways to control when you urinate, such as:  Urinating at scheduled times.  Training yourself to delay urination.  Doing exercises (Kegel exercises) to strengthen the muscles that control urine flow.  Lifestyle changes, such as changing your diet or taking steps to control stress.  Use of a device that provides electrical stimulation in order to reduce pain.  A procedure that stretches your bladder by filling it with air or fluid.  Surgery. This is rare. It is only done for extreme cases if other treatments do not help. Follow these instructions at home:  Take medicines only as directed by your health care provider.  Use bladder training techniques as directed.  Keep a bladder diary to find out which foods, liquids, or activities make your symptoms worse.  Use your bladder diary to schedule bathroom trips. If you are away from home, plan to be near a bathroom at each of your scheduled times.  Make sure you urinate just before you leave the house and just before you go to bed.  Do Kegel exercises as directed by your health care provider.  Do not drink alcohol.  Do not use any tobacco products, including cigarettes, chewing tobacco, or electronic cigarettes. If you need help quitting, ask your health care provider.  Make dietary changes as directed by your health care provider. You may need to avoid spicy foods and foods that contain a high amount of potassium.    Limit your drinking of beverages that stimulate urination. These include soda, coffee, and tea.  Keep all follow-up visits as directed by your health care provider. This is important. Contact a health care provider if:  Your symptoms do not get better after treatment.  Your pain and discomfort are getting worse.  You have more frequent urges to urinate.  You have a fever. Get help  right away if:  You are not able to control your bladder at all. This information is not intended to replace advice given to you by your health care provider. Make sure you discuss any questions you have with your health care provider. Document Released: 04/07/2004 Document Revised: 01/13/2016 Document Reviewed: 04/14/2014  2017 [employer]vier  

## 2016-08-31 NOTE — Addendum Note (Signed)
Addended by: Lurlean Nanny on: 08/31/2016 04:29 PM   Modules accepted: Orders

## 2016-09-30 ENCOUNTER — Other Ambulatory Visit: Payer: Self-pay | Admitting: Family Medicine

## 2016-10-02 NOTE — Telephone Encounter (Signed)
Please refill times 3 

## 2016-10-02 NOTE — Telephone Encounter (Signed)
Pt has CPE scheduled on 03/12/17, last filled on 05/27/15 #[id number] with 3 additional refills, please advise

## 2016-10-02 NOTE — Telephone Encounter (Signed)
done

## 2016-10-11 ENCOUNTER — Ambulatory Visit: Payer: Medicare Other | Admitting: Podiatry

## 2016-12-11 ENCOUNTER — Ambulatory Visit (INDEPENDENT_AMBULATORY_CARE_PROVIDER_SITE_OTHER): Payer: Medicare Other | Admitting: Family Medicine

## 2016-12-11 ENCOUNTER — Encounter: Payer: Self-pay | Admitting: Family Medicine

## 2016-12-11 VITALS — BP 128/78 | HR 71 | Temp 98.3°F | Ht 66.0 in | Wt 135.2 lb

## 2016-12-11 DIAGNOSIS — F430 Acute stress reaction: Secondary | ICD-10-CM

## 2016-12-11 DIAGNOSIS — M159 Polyosteoarthritis, unspecified: Secondary | ICD-10-CM | POA: Diagnosis not present

## 2016-12-11 DIAGNOSIS — F411 Generalized anxiety disorder: Secondary | ICD-10-CM | POA: Insufficient documentation

## 2016-12-11 MED ORDER — ALPRAZOLAM 0.25 MG PO TABS: 0.2500 mg | ORAL_TABLET | Freq: Two times a day (BID) | ORAL | 0 refills | Status: DC | PRN

## 2016-12-11 NOTE — Patient Instructions (Signed)
I wrote a px for the compounded product for hands to use three times daily as needed  The pharmacy can call me for further clarification if necessary  If not helpful - stop it and we can try regular diclofenac gel  Oral acetaminophen is also ok to try over the counter as needed for arthritis pain   Use xanax with caution of sedation and falls

## 2016-12-11 NOTE — Assessment & Plan Note (Signed)
Pt takes xanax infrequently for stress  Has had 4 family deaths recently and asked for refill today (first since last summer) Caution re: sedation/fall risk / do not drive with med

## 2016-12-11 NOTE — Assessment & Plan Note (Signed)
More stiffness and pain recently  Acetaminophen -not very helpful and cannot tolerate oral nsaids (GI side eff)  Diclofenac gel helps a little Pt is eager to try compounded med (5% diclofenac, 2% baclofen and 6% gabapentin)- that a friend had  It is medically necessary to treat discomfort for her to complete ADLS Px written for compounding pharmacy Will try and d/c if not effective  Disc side effect profile of each medication

## 2016-12-11 NOTE — Progress Notes (Signed)
Subjective:    Patient ID: Sarah Harper, female    DOB: 10-25-1940, 76 y.o.   MRN: [id number]  HPI Here for arthritis in hands   Very uncomfortable  Stiff  Not hurting and very sensitive to touch and heat  Making daily activities very difficult  Wants to continue cooking  R hand is worse  Also R handed  Painful to grip -has to be very careful   Joints are starting to swell Had to stop wearing rings on her R hand   Oral nsaids- cause gastritis/dyspepsia - even if she takes it with food  Recently took husb meloxicam- gave her abd pain    Some burning- it is not constant but not frequent -notices it when using hands but not at rest   Feet are not too bad/ has to wear supportive shoes No longer wears flip flops Tendon issues    Arthritis runs in her family  No auto immune arthritis however     Does not do well with oral anti inflammatories   Neighbor has a compounded topical prep she is interested  5% diclofenac and baclofen 2%and gabapentin 6%  She has been using her husband's diclofenac gel- helps a little/not tremendous   Was ref to [employer] [facility] in [place]   100 g - 85$  Insurance would cover part of if it is "medically necessary"  Tylenol is not very helpful orally   She also needs a refill of xanax which she uses very infrequently  Has had 4 deaths of friends and family recently    Patient Active Problem List   Diagnosis Date Noted  . Generalized osteoarthritis of hand 12/11/2016  . Stress reaction 12/11/2016  . Estrogen deficiency 05/11/2016  . Encounter for screening mammogram for breast cancer 05/11/2016  . OSA (obstructive sleep apnea) 03/06/2016  . Fatigue 01/25/2016  . Right ankle swelling 10/18/2015  . Left knee pain 10/18/2015  . Snoring 10/18/2015  . Routine general medical examination at a health care facility 02/28/2015  . Syncope and collapse 01/20/2015  . Encounter for Medicare annual wellness exam 02/06/2013  . Hyperlipidemia  08/27/2012  . Irregular heart beat 08/05/2012  . Gynecological examination 12/20/2010  . Screening for lipoid disorders 12/08/2010  . Eustachian tube dysfunction 11/29/2010  . BARRETTS ESOPHAGUS 10/01/2010  . Osteopenia 12/17/2009  . BACK PAIN, LUMBAR 07/02/2009  . IRRITABLE BOWEL SYNDROME 04/02/2009  . POSTMENOPAUSAL STATUS 12/10/2008  . Hypothyroidism 12/05/2007  . Meniere's disease 12/05/2007  . GERD 12/05/2007   Past Medical History:  Diagnosis Date  . DDD (degenerative disc disease)    chronic back pain  . ETD (eustachian tube dysfunction)   . GERD (gastroesophageal reflux disease)   . IBS (irritable bowel syndrome)   . Meniere's disease   . Migraine   . Osteoporosis   . Thyroid disease    hypothyroid   Past Surgical History:  Procedure Laterality Date  . APPENDECTOMY    . LUMBAR DISC SURGERY  1984   L5    Social History  Substance Use Topics  . Smoking status: Never Smoker  . Smokeless tobacco: Never Used  . Alcohol use 0.6 oz/week    1 Glasses of wine per week     Comment: wine occasional   Family History  Problem Relation Age of Onset  . Diabetes Paternal Aunt   . Hypertension Maternal Grandmother   . Hypertension Maternal Grandfather    Allergies  Allergen Reactions  . Epinephrine   . Nsaids  GI discomfort and gastritis   Current Outpatient Prescriptions on File Prior to Visit  Medication Sig Dispense Refill  . butalbital-acetaminophen-caffeine (FIORICET) 50-325-40 MG tablet Take 1 tablet by mouth every 6 (six) hours as needed. 90 tablet 0  . Calcium Carb-Cholecalciferol (CALCIUM 600 + D PO) Take 1 tablet by mouth daily.    Marland Kitchen conjugated estrogens (PREMARIN) vaginal cream APPLY SMALL AMOUNT VAGINALLY 2 TO 3 TIMES A WEEK 30 g 3  . dexlansoprazole (DEXILANT) 60 MG capsule Take 1 capsule (60 mg total) by mouth daily. 90 capsule 3  . levothyroxine (SYNTHROID, LEVOTHROID) 88 MCG tablet Take 1 tablet (88 mcg total) by mouth daily before breakfast. 90  tablet 3  . lidocaine (XYLOCAINE) 5 % ointment APPLY 1 APPLICATION TOPICALLY AS NEEDED 150 g 3  . linaclotide (LINZESS) 145 MCG CAPS capsule Take 1 capsule (145 mcg total) by mouth daily. 90 capsule 3  . mometasone (NASONEX) 50 MCG/ACT nasal spray Place 2 sprays into the nose daily as needed.    . Multiple Vitamin (MULTI VITAMIN PO) Multi Vitamin with Biotin    . pseudoephedrine (SUDAFED) 60 MG tablet Take 60 mg by mouth daily.     . traMADol (ULTRAM) 50 MG tablet Take 50 mg by mouth as needed for pain. Reported on 08/05/2015     No current facility-administered medications on file prior to visit.     Review of Systems    Review of Systems  Constitutional: Negative for fever, appetite change, fatigue and unexpected weight change.  Eyes: Negative for pain and visual disturbance.  Respiratory: Negative for cough and shortness of breath.   Cardiovascular: Negative for cp or palpitations    Gastrointestinal: Negative for nausea, diarrhea and constipation.  Genitourinary: Negative for urgency and frequency.  Skin: Negative for pallor or rash   MSK pos for hand pain and stiffness  Neurological: Negative for weakness, light-headedness, numbness and headaches.  Hematological: Negative for adenopathy. Does not bruise/bleed easily.  Psychiatric/Behavioral: Negative for dysphoric mood. The patient is occ anxious/pos for stressors and grief     Objective:   Physical Exam  Constitutional: She appears well-developed and well-nourished. No distress.  Well appearing   Neck: Normal range of motion. Neck supple.  Cardiovascular: Normal rate and regular rhythm.   Pulmonary/Chest: Effort normal.  Musculoskeletal: She exhibits tenderness. She exhibits no edema.  Bony changes of OA noted in pip joints of both hands-worse on the R  Index fingers are worst  Nl rom of fingers (with discomfort) Tender mcp joints  No triggering    No focal neuol changes   Lymphadenopathy:    She has no cervical  adenopathy.  Neurological: She displays no atrophy. No cranial nerve deficit or sensory deficit. She exhibits normal muscle tone.  Skin: Skin is warm and dry. No rash noted. No erythema.  Psychiatric: She has a normal mood and affect.          Assessment & Plan:   Problem List Items Addressed This Visit      Musculoskeletal and Integument   Generalized osteoarthritis of hand    More stiffness and pain recently  Acetaminophen -not very helpful and cannot tolerate oral nsaids (GI side eff)  Diclofenac gel helps a little Pt is eager to try compounded med (5% diclofenac, 2% baclofen and 6% gabapentin)- that a friend had  It is medically necessary to treat discomfort for her to complete ADLS Px written for compounding pharmacy Will try and d/c if not effective  Disc side  effect profile of each medication         Other   Stress reaction    Pt takes xanax infrequently for stress  Has had 4 family deaths recently and asked for refill today (first since last summer) Caution re: sedation/fall risk / do not drive with med

## 2016-12-11 NOTE — Progress Notes (Signed)
Pre visit review using our clinic review tool, if applicable. No additional management support is needed unless otherwise documented below in the visit note. 

## 2016-12-12 ENCOUNTER — Encounter: Payer: Self-pay | Admitting: Family Medicine

## 2017-01-01 ENCOUNTER — Encounter: Payer: Self-pay | Admitting: Podiatry

## 2017-01-01 ENCOUNTER — Ambulatory Visit (INDEPENDENT_AMBULATORY_CARE_PROVIDER_SITE_OTHER): Payer: Medicare Other | Admitting: Podiatry

## 2017-01-01 DIAGNOSIS — L603 Nail dystrophy: Secondary | ICD-10-CM

## 2017-01-01 DIAGNOSIS — S90222A Contusion of left lesser toe(s) with damage to nail, initial encounter: Secondary | ICD-10-CM

## 2017-01-01 NOTE — Progress Notes (Signed)
She presents today concerned of a dark lesion on the dorsolateral aspect of her left hallux nail. She states that it's only been there for about 2 weeks but does remember any trauma.  Objective: Vital signs are stable she is alert and oriented 3 is an obvious subungual hematoma left foot.  Assessment: Subungual hematoma hallux left.  Plan: I recommended that she continue to watch the hematoma as it grows out. She is to make sure this doesn't rule out however if it starts to reside in the nail and color the nail plate itself she is to notify me immediately.

## 2017-01-22 ENCOUNTER — Telehealth: Payer: Self-pay | Admitting: Family Medicine

## 2017-01-22 DIAGNOSIS — Z0000 Encounter for general adult medical examination without abnormal findings: Secondary | ICD-10-CM

## 2017-01-22 NOTE — Telephone Encounter (Signed)
-----   Message from Ellamae Sia sent at 01/22/2017  2:38 PM EDT ----- Regarding: Lab orders for Monday, 6.11.18 Patient is scheduled for CPX labs, please order future labs, Thanks , Karna Christmas

## 2017-01-25 ENCOUNTER — Other Ambulatory Visit: Payer: Medicare Other

## 2017-01-29 ENCOUNTER — Other Ambulatory Visit (INDEPENDENT_AMBULATORY_CARE_PROVIDER_SITE_OTHER): Payer: Medicare Other

## 2017-01-29 DIAGNOSIS — Z0000 Encounter for general adult medical examination without abnormal findings: Secondary | ICD-10-CM

## 2017-01-29 LAB — CBC WITH DIFFERENTIAL/PLATELET
Basophils Absolute: 0.1 10*3/uL (ref 0.0–0.1)
Basophils Relative: 1.1 % (ref 0.0–3.0)
Eosinophils Absolute: 0.2 10*3/uL (ref 0.0–0.7)
Eosinophils Relative: 4.6 % (ref 0.0–5.0)
HCT: 42.1 % (ref 36.0–46.0)
Hemoglobin: 14.1 g/dL (ref 12.0–15.0)
Lymphocytes Relative: 29.9 % (ref 12.0–46.0)
Lymphs Abs: 1.6 10*3/uL (ref 0.7–4.0)
MCHC: 33.5 g/dL (ref 30.0–36.0)
MCV: 88.3 fl (ref 78.0–100.0)
Monocytes Absolute: 0.6 10*3/uL (ref 0.1–1.0)
Monocytes Relative: 10.6 % (ref 3.0–12.0)
Neutro Abs: 2.8 10*3/uL (ref 1.4–7.7)
Neutrophils Relative %: 53.8 % (ref 43.0–77.0)
Platelets: 311 10*3/uL (ref 150.0–400.0)
RBC: 4.77 Mil/uL (ref 3.87–5.11)
RDW: 13.9 % (ref 11.5–15.5)
WBC: 5.3 10*3/uL (ref 4.0–10.5)

## 2017-01-29 LAB — COMPREHENSIVE METABOLIC PANEL
ALT: 13 U/L (ref 0–35)
AST: 19 U/L (ref 0–37)
Albumin: 4.4 g/dL (ref 3.5–5.2)
Alkaline Phosphatase: 85 U/L (ref 39–117)
BUN: 16 mg/dL (ref 6–23)
CO2: 31 mEq/L (ref 19–32)
Calcium: 10 mg/dL (ref 8.4–10.5)
Chloride: 99 mEq/L (ref 96–112)
Creatinine, Ser: 0.72 mg/dL (ref 0.40–1.20)
GFR: 83.79 mL/min (ref >60.00)
Glucose, Bld: 91 mg/dL (ref 70–99)
Potassium: 3.9 mEq/L (ref 3.5–5.1)
Sodium: 136 mEq/L (ref 135–145)
Total Bilirubin: 0.5 mg/dL (ref 0.2–1.2)
Total Protein: 7.2 g/dL (ref 6.0–8.3)

## 2017-01-29 LAB — LIPID PANEL
Cholesterol: 193 mg/dL (ref 0–200)
HDL: 79.5 mg/dL (ref >39.00)
LDL Cholesterol: 100 mg/dL — ABNORMAL HIGH (ref 0–99)
NonHDL: 113.03
Total CHOL/HDL Ratio: 2
Triglycerides: 64 mg/dL (ref 0.0–149.0)
VLDL: 12.8 mg/dL (ref 0.0–40.0)

## 2017-01-29 LAB — TSH: TSH: 3.32 u[IU]/mL (ref 0.35–4.50)

## 2017-02-02 ENCOUNTER — Ambulatory Visit (INDEPENDENT_AMBULATORY_CARE_PROVIDER_SITE_OTHER): Payer: Medicare Other | Admitting: Family Medicine

## 2017-02-02 ENCOUNTER — Encounter: Payer: Self-pay | Admitting: Family Medicine

## 2017-02-02 VITALS — BP 124/76 | HR 67 | Temp 98.8°F | Ht 65.0 in | Wt 133.0 lb

## 2017-02-02 DIAGNOSIS — E7800 Pure hypercholesterolemia, unspecified: Secondary | ICD-10-CM

## 2017-02-02 DIAGNOSIS — M8589 Other specified disorders of bone density and structure, multiple sites: Secondary | ICD-10-CM | POA: Diagnosis not present

## 2017-02-02 DIAGNOSIS — E039 Hypothyroidism, unspecified: Secondary | ICD-10-CM | POA: Diagnosis not present

## 2017-02-02 DIAGNOSIS — Z0000 Encounter for general adult medical examination without abnormal findings: Secondary | ICD-10-CM | POA: Diagnosis not present

## 2017-02-02 DIAGNOSIS — F430 Acute stress reaction: Secondary | ICD-10-CM | POA: Diagnosis not present

## 2017-02-02 MED ORDER — LEVOTHYROXINE SODIUM 88 MCG PO TABS: 88.0000 ug | ORAL_TABLET | Freq: Every day | ORAL | 3 refills | Status: DC

## 2017-02-02 MED ORDER — DEXLANSOPRAZOLE 60 MG PO CPDR: 60.0000 mg | DELAYED_RELEASE_CAPSULE | Freq: Every day | ORAL | 3 refills | Status: DC

## 2017-02-02 MED ORDER — LINACLOTIDE 145 MCG PO CAPS: 145.0000 ug | ORAL_CAPSULE | Freq: Every day | ORAL | 3 refills | Status: DC

## 2017-02-02 NOTE — Progress Notes (Signed)
Subjective:    Patient ID: Sarah Harper, female    DOB: 02/26/1941, 76 y.o.   MRN: [id number]  HPI Here for health maintenance exam and to review chronic medical problems    Using cpap for OSA  Sees ENT- had cerumen cleared by Dr Jeralyn Ruths with energy level  Takes longer to get things done  Takes naps  Has battled sadness-/stress  3 deaths (close to her) since last October  (40 yo nephew and BIL and SIL)  Also trying to care for her 57 yo mother in [place]   She has good support - declines counseling  Good self care   Has not had AMW visit - declines one currently  Flu shot 9/17  Mammogram 9/17 neg  Self breast exam -no lumps   Nl pap 2012 No gyn problems   Colonoscopy 3/09 ifob neg 3/12 Recently had hemorrhoids banded with Dr Allyn Kenner - this is helping  Will wait on cologuard until next year   Tetanus 5/13  PNA complete   dexa 12/17- stable mild osteopenia  No falls or fractures (never had a broken bone)  She takes her D3 3000 iu and 1 tums per day     Hx of gerd and remote barretts  Sees [facility] on dexilant   Hypothyroidism  Pt has no clinical changes She is tired and hair is thinning  Lab Results  Component Value Date   TSH 3.32 01/29/2017      Hx of hyperlipidemia Lab Results  Component Value Date   CHOL 193 01/29/2017   CHOL 189 03/01/2015   CHOL 221 (H) 02/17/2014   Lab Results  Component Value Date   HDL 79.50 01/29/2017   HDL 70.30 03/01/2015   HDL 103.80 02/17/2014   Lab Results  Component Value Date   LDLCALC 100 (H) 01/29/2017   LDLCALC 104 (H) 03/01/2015   LDLCALC 99 02/17/2014   Lab Results  Component Value Date   TRIG 64.0 01/29/2017   TRIG 72.0 03/01/2015   TRIG 93.0 02/17/2014   Lab Results  Component Value Date   CHOLHDL 2 01/29/2017   CHOLHDL 3 03/01/2015   CHOLHDL 2 02/17/2014   Lab Results  Component Value Date   LDLDIRECT 115.5 02/12/2013   LDLDIRECT 109.9 12/19/2010   LDLDIRECT 117.7  12/17/2009   Very good profile despite not a lot of exercise  (takes short walks- staying level)   Lab Results  Component Value Date   WBC 5.3 01/29/2017   HGB 14.1 01/29/2017   HCT 42.1 01/29/2017   MCV 88.3 01/29/2017   PLT 311.0 01/29/2017     Chemistry      Component Value Date/Time   NA 136 01/29/2017 0817   K 3.9 01/29/2017 0817   CL 99 01/29/2017 0817   CO2 31 01/29/2017 0817   BUN 16 01/29/2017 0817   CREATININE 0.72 01/29/2017 0817      Component Value Date/Time   CALCIUM 10.0 01/29/2017 0817   ALKPHOS 85 01/29/2017 0817   AST 19 01/29/2017 0817   ALT 13 01/29/2017 0817   BILITOT 0.5 01/29/2017 0817     Glucose 91    Patient Active Problem List   Diagnosis Date Noted  . Generalized osteoarthritis of hand 12/11/2016  . Stress reaction 12/11/2016  . Estrogen deficiency 05/11/2016  . Encounter for screening mammogram for breast cancer 05/11/2016  . OSA (obstructive sleep apnea) 03/06/2016  . Fatigue 01/25/2016  . Right ankle swelling 10/18/2015  .  Left knee pain 10/18/2015  . Snoring 10/18/2015  . Routine general medical examination at a health care facility 02/28/2015  . Encounter for Medicare annual wellness exam 02/06/2013  . Hyperlipidemia 08/27/2012  . Irregular heart beat 08/05/2012  . Gynecological examination 12/20/2010  . Eustachian tube dysfunction 11/29/2010  . BARRETTS ESOPHAGUS 10/01/2010  . Osteopenia 12/17/2009  . BACK PAIN, LUMBAR 07/02/2009  . IRRITABLE BOWEL SYNDROME 04/02/2009  . POSTMENOPAUSAL STATUS 12/10/2008  . Hypothyroidism 12/05/2007  . Meniere's disease 12/05/2007  . GERD 12/05/2007   Past Medical History:  Diagnosis Date  . DDD (degenerative disc disease)    chronic back pain  . ETD (eustachian tube dysfunction)   . GERD (gastroesophageal reflux disease)   . IBS (irritable bowel syndrome)   . Meniere's disease   . Migraine   . Osteoporosis   . Thyroid disease    hypothyroid   Past Surgical History:  Procedure  Laterality Date  . APPENDECTOMY    . LUMBAR DISC SURGERY  1984   L5    Social History  Substance Use Topics  . Smoking status: Never Smoker  . Smokeless tobacco: Never Used  . Alcohol use 0.6 oz/week    1 Glasses of wine per week     Comment: wine occasional   Family History  Problem Relation Age of Onset  . Diabetes Paternal Aunt   . Hypertension Maternal Grandmother   . Hypertension Maternal Grandfather    Allergies  Allergen Reactions  . Epinephrine   . Nsaids     GI discomfort and gastritis  . Tolmetin Other (See Comments)    GI discomfort and gastritis   Current Outpatient Prescriptions on File Prior to Visit  Medication Sig Dispense Refill  . ALPRAZolam (XANAX) 0.25 MG tablet Take 1 tablet (0.25 mg total) by mouth 2 (two) times daily as needed for anxiety (with caution of sedation). 30 tablet 0  . butalbital-acetaminophen-caffeine (FIORICET) 50-325-40 MG tablet Take 1 tablet by mouth every 6 (six) hours as needed. 90 tablet 0  . Calcium Carb-Cholecalciferol (CALCIUM 600 + D PO) Take 1 tablet by mouth daily.    Marland Kitchen conjugated estrogens (PREMARIN) vaginal cream APPLY SMALL AMOUNT VAGINALLY 2 TO 3 TIMES A WEEK 30 g 3  . lidocaine (XYLOCAINE) 5 % ointment APPLY 1 APPLICATION TOPICALLY AS NEEDED 150 g 3  . mometasone (NASONEX) 50 MCG/ACT nasal spray Place 2 sprays into the nose daily as needed.    . Multiple Vitamin (MULTI VITAMIN PO) Multi Vitamin with Biotin    . PRESCRIPTION MEDICATION Compounded diclofenac 5%, gabapentin 6% and baclofen 2% topical to hands prn as directed    . pseudoephedrine (SUDAFED) 60 MG tablet Take 60 mg by mouth daily.     . traMADol (ULTRAM) 50 MG tablet Take 50 mg by mouth as needed for pain. Reported on 08/05/2015     No current facility-administered medications on file prior to visit.     Review of Systems Review of Systems  Constitutional: Negative for fever, appetite change,  and unexpected weight change. pos for gen fatigue  Eyes: Negative  for pain and visual disturbance.  Respiratory: Negative for cough and shortness of breath.   Cardiovascular: Negative for cp or palpitations    Gastrointestinal: Negative for nausea, diarrhea and constipation.  Genitourinary: Negative for urgency and frequency.  Skin: Negative for pallor or rash   Neurological: Negative for weakness, light-headedness, numbness and headaches.  Hematological: Negative for adenopathy. Does not bruise/bleed easily.  Psychiatric/Behavioral: Negative for dysphoric  mood. The patient is not nervous/anxious.  Pos for grief and stressors        Objective:   Physical Exam  Constitutional: She appears well-developed and well-nourished. No distress.  HENT:  Head: Normocephalic and atraumatic.  Right Ear: External ear normal.  Left Ear: External ear normal.  Mouth/Throat: Oropharynx is clear and moist.  Eyes: Conjunctivae and EOM are normal. Pupils are equal, round, and reactive to light. No scleral icterus.  Neck: Normal range of motion. Neck supple. No JVD present. Carotid bruit is not present. No thyromegaly present.  Cardiovascular: Normal rate, regular rhythm, normal heart sounds and intact distal pulses.  Exam reveals no gallop.   Pulmonary/Chest: Effort normal and breath sounds normal. No respiratory distress. She has no wheezes. She exhibits no tenderness.  Abdominal: Soft. Bowel sounds are normal. She exhibits no distension, no abdominal bruit and no mass. There is no tenderness.  Genitourinary: No breast swelling, tenderness, discharge or bleeding.  Genitourinary Comments: Breast exam: No mass, nodules, thickening, tenderness, bulging, retraction, inflamation, nipple discharge or skin changes noted.  No axillary or clavicular LA.      Musculoskeletal: Normal range of motion. She exhibits no edema or tenderness.  Lymphadenopathy:    She has no cervical adenopathy.  Neurological: She is alert. She has normal reflexes. No cranial nerve deficit. She exhibits  normal muscle tone. Coordination normal.  Skin: Skin is warm and dry. No rash noted. No erythema. No pallor.  Solar lentigines diffusely sks on extremities  Psychiatric: She has a normal mood and affect.          Assessment & Plan:   Problem List Items Addressed This Visit      Endocrine   Hypothyroidism - Primary    Hypothyroidism  Pt has no clinical changes No change in energy level/ hair or skin/ edema and no tremor Lab Results  Component Value Date   TSH 3.32 01/29/2017          Relevant Medications   levothyroxine (SYNTHROID, LEVOTHROID) 88 MCG tablet     Musculoskeletal and Integument   Osteopenia    dexa rev 12/17- no falls or fractures  Takes D  Exercises  Disc need for calcium/ vitamin D/ wt bearing exercise and bone density test every 2 y to monitor Disc safety/ fracture risk in detail          Other   Hyperlipidemia    Disc goals for lipids and reasons to control them Rev labs with pt Rev low sat fat diet in detail  Well controlled with diet and high HDL      Routine general medical examination at a health care facility    Reviewed health habits including diet and exercise and skin cancer prevention Reviewed appropriate screening tests for age  Also reviewed health mt list, fam hx and immunization status , as well as social and family history   See HPI Labs reviewed  Encouraged exercise/self care for stress/grief  Commended good habits      Stress reaction    Disc losses Reviewed stressors/ coping techniques/symptoms/ support sources/ tx options and side effects in detail today  Has good support  Declines counseling at this time

## 2017-02-02 NOTE — Patient Instructions (Signed)
Keep up the good work with self care  If you ever want to see a counselor for stress or grief - let us know  Stay active - keep doing yoga when you can   If you want to do the annual medicare wellness interview - let us know

## 2017-02-04 NOTE — Assessment & Plan Note (Signed)
Disc losses Reviewed stressors/ coping techniques/symptoms/ support sources/ tx options and side effects in detail today  Has good support  Declines counseling at this time

## 2017-02-04 NOTE — Assessment & Plan Note (Signed)
dexa rev 12/17- no falls or fractures  Takes D  Exercises  Disc need for calcium/ vitamin D/ wt bearing exercise and bone density test every 2 y to monitor Disc safety/ fracture risk in detail

## 2017-02-04 NOTE — Assessment & Plan Note (Signed)
Hypothyroidism  Pt has no clinical changes No change in energy level/ hair or skin/ edema and no tremor Lab Results  Component Value Date   TSH 3.32 01/29/2017

## 2017-02-04 NOTE — Assessment & Plan Note (Signed)
Disc goals for lipids and reasons to control them Rev labs with pt Rev low sat fat diet in detail  Well controlled with diet and high HDL

## 2017-02-04 NOTE — Assessment & Plan Note (Signed)
Reviewed health habits including diet and exercise and skin cancer prevention Reviewed appropriate screening tests for age  Also reviewed health mt list, fam hx and immunization status , as well as social and family history   See HPI Labs reviewed  Encouraged exercise/self care for stress/grief  Commended good habits

## 2017-03-01 ENCOUNTER — Other Ambulatory Visit: Payer: Medicare Other

## 2017-03-12 ENCOUNTER — Encounter: Payer: Medicare Other | Admitting: Family Medicine

## 2017-04-17 ENCOUNTER — Encounter: Payer: Self-pay | Admitting: Family Medicine

## 2017-04-19 MED ORDER — DICLOFENAC SODIUM 1.6 % TD GEL: 4.0000 g | Freq: Four times a day (QID) | TRANSDERMAL | 1 refills | Status: DC | PRN

## 2017-04-26 ENCOUNTER — Telehealth: Payer: Self-pay | Admitting: *Deleted

## 2017-04-26 NOTE — Telephone Encounter (Signed)
Received a fax from [facility] saying that the diclofenac sodium you sent in doesn't come in the indicated strength (1.6%) they advise they have a 1% (voltaren) or a 3% (solaraze) and that's it, they are requesting you send a new Rx with the correct % to them.

## 2017-04-26 NOTE — Telephone Encounter (Signed)
Please let pt know and ask if she wants the higher or lower potency- I do not have a pref I think she originally wanted 1.5% but I could not find it in the drug data base

## 2017-04-26 NOTE — Telephone Encounter (Signed)
Pt said she wants the 1% because she isn't sure the coverage and price of the 3%, please send in new Rx

## 2017-04-27 MED ORDER — DICLOFENAC SODIUM 1 % TD GEL: 2.0000 g | Freq: Four times a day (QID) | TRANSDERMAL | 3 refills | Status: DC

## 2017-04-27 NOTE — Telephone Encounter (Signed)
I sent it  

## 2017-05-02 DIAGNOSIS — K648 Other hemorrhoids: Secondary | ICD-10-CM | POA: Insufficient documentation

## 2017-05-02 DIAGNOSIS — M47816 Spondylosis without myelopathy or radiculopathy, lumbar region: Secondary | ICD-10-CM | POA: Insufficient documentation

## 2017-05-10 ENCOUNTER — Ambulatory Visit (INDEPENDENT_AMBULATORY_CARE_PROVIDER_SITE_OTHER): Payer: Medicare Other

## 2017-05-10 DIAGNOSIS — Z23 Encounter for immunization: Secondary | ICD-10-CM

## 2017-11-27 ENCOUNTER — Other Ambulatory Visit: Payer: Self-pay | Admitting: Family Medicine

## 2017-11-27 DIAGNOSIS — Z1231 Encounter for screening mammogram for malignant neoplasm of breast: Secondary | ICD-10-CM

## 2018-01-31 ENCOUNTER — Telehealth (INDEPENDENT_AMBULATORY_CARE_PROVIDER_SITE_OTHER): Payer: Medicare Other | Admitting: Family Medicine

## 2018-01-31 ENCOUNTER — Other Ambulatory Visit (INDEPENDENT_AMBULATORY_CARE_PROVIDER_SITE_OTHER): Payer: Medicare Other

## 2018-01-31 DIAGNOSIS — E039 Hypothyroidism, unspecified: Secondary | ICD-10-CM | POA: Diagnosis not present

## 2018-01-31 DIAGNOSIS — Z0000 Encounter for general adult medical examination without abnormal findings: Secondary | ICD-10-CM | POA: Diagnosis not present

## 2018-01-31 DIAGNOSIS — E7800 Pure hypercholesterolemia, unspecified: Secondary | ICD-10-CM

## 2018-01-31 LAB — LIPID PANEL
Cholesterol: 198 mg/dL (ref 0–200)
HDL: 83.6 mg/dL (ref >39.00)
LDL Cholesterol: 98 mg/dL (ref 0–99)
NonHDL: 114.64
Total CHOL/HDL Ratio: 2
Triglycerides: 84 mg/dL (ref 0.0–149.0)
VLDL: 16.8 mg/dL (ref 0.0–40.0)

## 2018-01-31 LAB — COMPREHENSIVE METABOLIC PANEL
ALT: 16 U/L (ref 0–35)
AST: 21 U/L (ref 0–37)
Albumin: 4.3 g/dL (ref 3.5–5.2)
Alkaline Phosphatase: 74 U/L (ref 39–117)
BUN: 14 mg/dL (ref 6–23)
CO2: 32 mEq/L (ref 19–32)
Calcium: 9.6 mg/dL (ref 8.4–10.5)
Chloride: 101 mEq/L (ref 96–112)
Creatinine, Ser: 0.75 mg/dL (ref 0.40–1.20)
GFR: 79.72 mL/min (ref >60.00)
Glucose, Bld: 92 mg/dL (ref 70–99)
Potassium: 4.2 mEq/L (ref 3.5–5.1)
Sodium: 137 mEq/L (ref 135–145)
Total Bilirubin: 0.5 mg/dL (ref 0.2–1.2)
Total Protein: 6.7 g/dL (ref 6.0–8.3)

## 2018-01-31 LAB — TSH: TSH: 3.06 u[IU]/mL (ref 0.35–4.50)

## 2018-01-31 LAB — CBC WITH DIFFERENTIAL/PLATELET
Basophils Absolute: 0.1 10*3/uL (ref 0.0–0.1)
Basophils Relative: 1.1 % (ref 0.0–3.0)
Eosinophils Absolute: 0.3 10*3/uL (ref 0.0–0.7)
Eosinophils Relative: 4.9 % (ref 0.0–5.0)
HCT: 40 % (ref 36.0–46.0)
Hemoglobin: 13.6 g/dL (ref 12.0–15.0)
Lymphocytes Relative: 28.9 % (ref 12.0–46.0)
Lymphs Abs: 1.6 10*3/uL (ref 0.7–4.0)
MCHC: 34 g/dL (ref 30.0–36.0)
MCV: 89.7 fl (ref 78.0–100.0)
Monocytes Absolute: 0.5 10*3/uL (ref 0.1–1.0)
Monocytes Relative: 8.8 % (ref 3.0–12.0)
Neutro Abs: 3 10*3/uL (ref 1.4–7.7)
Neutrophils Relative %: 56.3 % (ref 43.0–77.0)
Platelets: 255 10*3/uL (ref 150.0–400.0)
RBC: 4.46 Mil/uL (ref 3.87–5.11)
RDW: 13.6 % (ref 11.5–15.5)
WBC: 5.4 10*3/uL (ref 4.0–10.5)

## 2018-01-31 NOTE — Telephone Encounter (Signed)
Lab order

## 2018-02-04 ENCOUNTER — Ambulatory Visit
Admission: RE | Admit: 2018-02-04 | Discharge: 2018-02-04 | Disposition: A | Discharge status: Home / Self Care | Payer: Medicare Other | Source: Ambulatory Visit | Attending: Family Medicine | Admitting: Family Medicine

## 2018-02-04 DIAGNOSIS — Z1231 Encounter for screening mammogram for malignant neoplasm of breast: Secondary | ICD-10-CM

## 2018-02-05 ENCOUNTER — Ambulatory Visit (INDEPENDENT_AMBULATORY_CARE_PROVIDER_SITE_OTHER): Payer: Medicare Other | Admitting: Family Medicine

## 2018-02-05 ENCOUNTER — Other Ambulatory Visit: Payer: Self-pay | Admitting: Family Medicine

## 2018-02-05 ENCOUNTER — Encounter: Payer: Self-pay | Admitting: Family Medicine

## 2018-02-05 VITALS — BP 140/78 | HR 72 | Temp 98.1°F | Ht 65.0 in | Wt 135.2 lb

## 2018-02-05 DIAGNOSIS — E7800 Pure hypercholesterolemia, unspecified: Secondary | ICD-10-CM | POA: Diagnosis not present

## 2018-02-05 DIAGNOSIS — M8589 Other specified disorders of bone density and structure, multiple sites: Secondary | ICD-10-CM | POA: Diagnosis not present

## 2018-02-05 DIAGNOSIS — E039 Hypothyroidism, unspecified: Secondary | ICD-10-CM | POA: Diagnosis not present

## 2018-02-05 DIAGNOSIS — Z1211 Encounter for screening for malignant neoplasm of colon: Secondary | ICD-10-CM | POA: Insufficient documentation

## 2018-02-05 DIAGNOSIS — Z0000 Encounter for general adult medical examination without abnormal findings: Secondary | ICD-10-CM | POA: Diagnosis not present

## 2018-02-05 DIAGNOSIS — R928 Other abnormal and inconclusive findings on diagnostic imaging of breast: Secondary | ICD-10-CM

## 2018-02-05 HISTORY — DX: Encounter for screening for malignant neoplasm of colon: Z12.11

## 2018-02-05 MED ORDER — LEVOTHYROXINE SODIUM 88 MCG PO TABS: 88.0000 ug | ORAL_TABLET | Freq: Every day | ORAL | 3 refills | Status: DC

## 2018-02-05 MED ORDER — LINACLOTIDE 145 MCG PO CAPS: 145.0000 ug | ORAL_CAPSULE | Freq: Every day | ORAL | 3 refills | Status: DC

## 2018-02-05 MED ORDER — DEXLANSOPRAZOLE 60 MG PO CPDR
60.0000 mg | DELAYED_RELEASE_CAPSULE | Freq: Every day | ORAL | 3 refills | Status: DC
Start: 2018-02-05 — End: 2019-03-07

## 2018-02-05 MED ORDER — ESTRADIOL 0.1 MG/GM VA CREA: TOPICAL_CREAM | VAGINAL | 1 refills | Status: DC

## 2018-02-05 NOTE — Assessment & Plan Note (Signed)
Reviewed health habits including diet and exercise and skin cancer prevention Reviewed appropriate screening tests for age  Also reviewed health mt list, fam hx and immunization status , as well as social and family history   See HPI Labs reviewed  Disc shingrix vaccine  cologuard ordered  Enc good health habits and regular derm f/u

## 2018-02-05 NOTE — Assessment & Plan Note (Signed)
Signed up for cologuard  

## 2018-02-05 NOTE — Assessment & Plan Note (Signed)
Hypothyroidism  Pt has no clinical changes No change in energy level/ hair or skin/ edema and no tremor Lab Results  Component Value Date   TSH 3.06 01/31/2018

## 2018-02-05 NOTE — Assessment & Plan Note (Signed)
Disc goals for lipids and reasons to control them Rev last labs with pt Rev low sat fat diet in detail Good profile 

## 2018-02-05 NOTE — Assessment & Plan Note (Signed)
dexa due 12/19 for 2 y f/u  No falls or fx On ca and D Good exercise

## 2018-02-05 NOTE — Patient Instructions (Addendum)
If you are interested in the new shingles vaccine (Shingrix) - call your local pharmacy to check on coverage and availability  Get on a waiting list if affordable    Take care of yourself  Stay as active as you can   We will sign you up for the cologuard program

## 2018-02-05 NOTE — Progress Notes (Signed)
Subjective:    Patient ID: Sarah Harper, female    DOB: 06/23/1941, 77 y.o.   MRN: [id number]  HPI Here for health maintenance exam and to review chronic medical problems    Wt Readings from Last 3 Encounters:  02/05/18 135 lb 4 oz (61.3 kg)  02/02/17 133 lb (60.3 kg)  12/11/16 135 lb 4 oz (61.3 kg)   22.51 kg/m  Wt is stable   Doing ok in general   Seeing Dr Theda Sers at [place] - for OA knees  Probable torn meniscus L knee  Had injections and diclofenec gel  Also doing PT -helping balance also  Back also hurts all the time -made some modification  Knees feel a lot better   Works with a chiropractor as well  Also getting PEMF treatments and thinks that is helping    Declines amw visit   Still struggles to manage anxiety    Flu shot 9/18 Tetanus shot 5/13 PNA vaccines complete zostavax 3/14  dexa 12/17 stable mild osteopenia  No falls or fx  Takes ca and D   Mammogram 6.19- recall R breast density- [place] and diag mm planned  Self breast exam -no lumps   Gyn hx no problems   Colonoscopy 3/09 Has had hemorrhoid banding  Still sees Dr Allyn Kenner  Interested in cologuard   Hypothyroidism  Pt has no clinical changes No change in energy level/ hair or skin/ edema and no tremor Lab Results  Component Value Date   TSH 3.06 01/31/2018     Hyperlipidemia Lab Results  Component Value Date   CHOL 198 01/31/2018   CHOL 193 01/29/2017   CHOL 189 03/01/2015   Lab Results  Component Value Date   HDL 83.60 01/31/2018   HDL 79.50 01/29/2017   HDL 70.30 03/01/2015   Lab Results  Component Value Date   LDLCALC 98 01/31/2018   LDLCALC 100 (H) 01/29/2017   LDLCALC 104 (H) 03/01/2015   Lab Results  Component Value Date   TRIG 84.0 01/31/2018   TRIG 64.0 01/29/2017   TRIG 72.0 03/01/2015   Lab Results  Component Value Date   CHOLHDL 2 01/31/2018   CHOLHDL 2 01/29/2017   CHOLHDL 3 03/01/2015   Lab Results  Component Value Date   LDLDIRECT 115.5  02/12/2013   LDLDIRECT 109.9 12/19/2010   LDLDIRECT 117.7 12/17/2009  very good profile Has good genetics  Also eats healthy   Other labs Results for orders placed or performed in visit on 01/31/18  CBC with Differential/Platelet  Result Value Ref Range   WBC 5.4 4.0 - 10.5 K/uL   RBC 4.46 3.87 - 5.11 Mil/uL   Hemoglobin 13.6 12.0 - 15.0 g/dL   HCT 40.0 36.0 - 46.0 %   MCV 89.7 78.0 - 100.0 fl   MCHC 34.0 30.0 - 36.0 g/dL   RDW 13.6 11.5 - 15.5 %   Platelets 255.0 150.0 - 400.0 K/uL   Neutrophils Relative % 56.3 43.0 - 77.0 %   Lymphocytes Relative 28.9 12.0 - 46.0 %   Monocytes Relative 8.8 3.0 - 12.0 %   Eosinophils Relative 4.9 0.0 - 5.0 %   Basophils Relative 1.1 0.0 - 3.0 %   Neutro Abs 3.0 1.4 - 7.7 K/uL   Lymphs Abs 1.6 0.7 - 4.0 K/uL   Monocytes Absolute 0.5 0.1 - 1.0 K/uL   Eosinophils Absolute 0.3 0.0 - 0.7 K/uL   Basophils Absolute 0.1 0.0 - 0.1 K/uL  Comprehensive metabolic panel  Result Value Ref Range   Sodium 137 135 - 145 mEq/L   Potassium 4.2 3.5 - 5.1 mEq/L   Chloride 101 96 - 112 mEq/L   CO2 32 19 - 32 mEq/L   Glucose, Bld 92 70 - 99 mg/dL   BUN 14 6 - 23 mg/dL   Creatinine, Ser 0.75 0.40 - 1.20 mg/dL   Total Bilirubin 0.5 0.2 - 1.2 mg/dL   Alkaline Phosphatase 74 39 - 117 U/L   AST 21 0 - 37 U/L   ALT 16 0 - 35 U/L   Total Protein 6.7 6.0 - 8.3 g/dL   Albumin 4.3 3.5 - 5.2 g/dL   Calcium 9.6 8.4 - 10.5 mg/dL   GFR 79.72 >60.00 mL/min  Lipid panel  Result Value Ref Range   Cholesterol 198 0 - 200 mg/dL   Triglycerides 84.0 0.0 - 149.0 mg/dL   HDL 83.60 >39.00 mg/dL   VLDL 16.8 0.0 - 40.0 mg/dL   LDL Cholesterol 98 0 - 99 mg/dL   Total CHOL/HDL Ratio 2    NonHDL 114.64   TSH  Result Value Ref Range   TSH 3.06 0.35 - 4.50 uIU/mL      Patient Active Problem List   Diagnosis Date Noted  . Colon cancer screening 02/05/2018  . Generalized osteoarthritis of hand 12/11/2016  . Stress reaction 12/11/2016  . Estrogen deficiency 05/11/2016  .  Encounter for screening mammogram for breast cancer 05/11/2016  . OSA (obstructive sleep apnea) 03/06/2016  . Fatigue 01/25/2016  . Right ankle swelling 10/18/2015  . Left knee pain 10/18/2015  . Snoring 10/18/2015  . Routine general medical examination at a health care facility 02/28/2015  . Encounter for Medicare annual wellness exam 02/06/2013  . Hyperlipidemia 08/27/2012  . Irregular heart beat 08/05/2012  . Gynecological examination 12/20/2010  . Eustachian tube dysfunction 11/29/2010  . BARRETTS ESOPHAGUS 10/01/2010  . Osteopenia 12/17/2009  . BACK PAIN, LUMBAR 07/02/2009  . IRRITABLE BOWEL SYNDROME 04/02/2009  . POSTMENOPAUSAL STATUS 12/10/2008  . Hypothyroidism 12/05/2007  . Meniere's disease 12/05/2007  . GERD 12/05/2007   Past Medical History:  Diagnosis Date  . DDD (degenerative disc disease)    chronic back pain  . ETD (eustachian tube dysfunction)   . GERD (gastroesophageal reflux disease)   . IBS (irritable bowel syndrome)   . Meniere's disease   . Migraine   . Osteoporosis   . Thyroid disease    hypothyroid   Past Surgical History:  Procedure Laterality Date  . APPENDECTOMY    . LUMBAR DISC SURGERY  1984   L5    Social History   Tobacco Use  . Smoking status: Never Smoker  . Smokeless tobacco: Never Used  Substance Use Topics  . Alcohol use: Yes    Alcohol/week: 0.6 oz    Types: 1 Glasses of wine per week    Comment: wine occasional  . Drug use: No   Family History  Problem Relation Age of Onset  . Diabetes Paternal Aunt   . Hypertension Maternal Grandmother   . Hypertension Maternal Grandfather    Allergies  Allergen Reactions  . Epinephrine   . Nsaids     GI discomfort and gastritis  . Tolmetin Other (See Comments)    GI discomfort and gastritis   Current Outpatient Medications on File Prior to Visit  Medication Sig Dispense Refill  . ALPRAZolam (XANAX) 0.25 MG tablet Take 1 tablet (0.25 mg total) by mouth 2 (two) times daily as  needed for  anxiety (with caution of sedation). 30 tablet 0  . butalbital-acetaminophen-caffeine (FIORICET) 50-325-40 MG tablet Take 1 tablet by mouth every 6 (six) hours as needed. 90 tablet 0  . Calcium Carb-Cholecalciferol (CALCIUM 600 + D PO) Take 1 tablet by mouth daily.    . diclofenac sodium (VOLTAREN) 1 % GEL Apply 2 g topically 4 (four) times daily. To affected areas 3 Tube 3  . lidocaine (XYLOCAINE) 5 % ointment APPLY 1 APPLICATION TOPICALLY AS NEEDED 150 g 3  . mometasone (NASONEX) 50 MCG/ACT nasal spray Place 2 sprays into the nose daily as needed.    . Multiple Vitamin (MULTI VITAMIN PO) Multi Vitamin with Biotin    . PRESCRIPTION MEDICATION Compounded diclofenac 5%, gabapentin 6% and baclofen 2% topical to hands prn as directed    . traMADol (ULTRAM) 50 MG tablet Take 50 mg by mouth as needed for pain. Reported on 08/05/2015    . pseudoephedrine (SUDAFED) 60 MG tablet Take 60 mg by mouth daily.      No current facility-administered medications on file prior to visit.     Review of Systems  Constitutional: Negative for activity change, appetite change, fatigue, fever and unexpected weight change.  HENT: Negative for congestion, ear pain, rhinorrhea, sinus pressure and sore throat.   Eyes: Negative for pain, redness and visual disturbance.  Respiratory: Negative for cough, shortness of breath and wheezing.   Cardiovascular: Negative for chest pain and palpitations.  Gastrointestinal: Negative for abdominal pain, blood in stool, constipation and diarrhea.  Endocrine: Negative for polydipsia and polyuria.  Genitourinary: Negative for dysuria, frequency and urgency.  Musculoskeletal: Positive for arthralgias and back pain. Negative for myalgias.  Skin: Negative for pallor and rash.  Allergic/Immunologic: Negative for environmental allergies.  Neurological: Negative for dizziness, syncope and headaches.  Hematological: Negative for adenopathy. Does not bruise/bleed easily.    Psychiatric/Behavioral: Negative for decreased concentration and dysphoric mood. The patient is not nervous/anxious.        Objective:   Physical Exam  Constitutional: She appears well-developed and well-nourished. No distress.  Well appearing   HENT:  Head: Normocephalic and atraumatic.  Right Ear: External ear normal.  Left Ear: External ear normal.  Mouth/Throat: Oropharynx is clear and moist.  Eyes: Pupils are equal, round, and reactive to light. Conjunctivae and EOM are normal. No scleral icterus.  Neck: Normal range of motion. Neck supple. No JVD present. Carotid bruit is not present. No thyromegaly present.  Cardiovascular: Normal rate, regular rhythm, normal heart sounds and intact distal pulses. Exam reveals no gallop.  Pulmonary/Chest: Effort normal and breath sounds normal. No respiratory distress. She has no wheezes. She exhibits no tenderness. No breast tenderness, discharge or bleeding.  Abdominal: Soft. Bowel sounds are normal. She exhibits no distension, no abdominal bruit and no mass. There is no tenderness.  Genitourinary: No breast tenderness, discharge or bleeding.  Genitourinary Comments: Breast exam: No mass, nodules, thickening, tenderness, bulging, retraction, inflamation, nipple discharge or skin changes noted.  No axillary or clavicular LA.      Musculoskeletal: She exhibits no edema or tenderness.  Lymphadenopathy:    She has no cervical adenopathy.  Neurological: She is alert. She has normal reflexes. No cranial nerve deficit. She exhibits normal muscle tone. Coordination normal.  Skin: Skin is warm and dry. No rash noted. No erythema. No pallor.  Solar lentigines diffusely Solar aging  sks scattered   Psychiatric: She has a normal mood and affect.  Pleasant  Assessment & Plan:   Problem List Items Addressed This Visit      Endocrine   Hypothyroidism    Hypothyroidism  Pt has no clinical changes No change in energy level/ hair or  skin/ edema and no tremor Lab Results  Component Value Date   TSH 3.06 01/31/2018          Relevant Medications   levothyroxine (SYNTHROID, LEVOTHROID) 88 MCG tablet     Musculoskeletal and Integument   Osteopenia    dexa due 12/19 for 2 y f/u  No falls or fx On ca and D Good exercise         Other   Colon cancer screening    Signed up for cologuard      Hyperlipidemia    Disc goals for lipids and reasons to control them Rev last labs with pt Rev low sat fat diet in detail Good profile      Routine general medical examination at a health care facility - Primary    Reviewed health habits including diet and exercise and skin cancer prevention Reviewed appropriate screening tests for age  Also reviewed health mt list, fam hx and immunization status , as well as social and family history   See HPI Labs reviewed  Disc shingrix vaccine  cologuard ordered  Enc good health habits and regular derm f/u

## 2018-02-08 ENCOUNTER — Ambulatory Visit
Admission: RE | Admit: 2018-02-08 | Discharge: 2018-02-08 | Disposition: A | Discharge status: Home / Self Care | Payer: Medicare Other | Source: Ambulatory Visit | Attending: Family Medicine | Admitting: Family Medicine

## 2018-02-08 DIAGNOSIS — R928 Other abnormal and inconclusive findings on diagnostic imaging of breast: Secondary | ICD-10-CM

## 2018-02-13 LAB — COLOGUARD: Cologuard: NEGATIVE

## 2018-03-04 ENCOUNTER — Encounter: Payer: Self-pay | Admitting: Family Medicine

## 2018-03-11 ENCOUNTER — Encounter: Payer: Self-pay | Admitting: Family Medicine

## 2018-05-16 ENCOUNTER — Ambulatory Visit (INDEPENDENT_AMBULATORY_CARE_PROVIDER_SITE_OTHER): Payer: Medicare Other

## 2018-05-16 DIAGNOSIS — Z23 Encounter for immunization: Secondary | ICD-10-CM | POA: Diagnosis not present

## 2018-05-23 ENCOUNTER — Other Ambulatory Visit: Payer: Self-pay | Admitting: Family Medicine

## 2018-05-24 NOTE — Telephone Encounter (Signed)
CPE scheduled on 02/11/19, last filled on 04/27/17 #3 tubes with 3 refills

## 2018-08-07 ENCOUNTER — Encounter: Payer: Self-pay | Admitting: Family Medicine

## 2018-08-07 ENCOUNTER — Ambulatory Visit (INDEPENDENT_AMBULATORY_CARE_PROVIDER_SITE_OTHER): Payer: Medicare Other | Admitting: Family Medicine

## 2018-08-07 DIAGNOSIS — B9789 Other viral agents as the cause of diseases classified elsewhere: Secondary | ICD-10-CM

## 2018-08-07 DIAGNOSIS — J069 Acute upper respiratory infection, unspecified: Secondary | ICD-10-CM | POA: Diagnosis not present

## 2018-08-07 MED ORDER — AZITHROMYCIN 250 MG PO TABS: ORAL_TABLET | ORAL | 0 refills | Status: DC

## 2018-08-07 NOTE — Progress Notes (Signed)
Subjective:    Patient ID: Sarah Harper, female    DOB: 1941/06/04, 77 y.o.   MRN: [id number]  HPI Here for uri symptoms  Symptoms started 2 weeks ago   Lots of head congestion  Sneezing -that improved  Coughing worsened  Last night she was a little better- then worse at 2:30 am -sneezing and coughing  Thick mucous instead of thin Still clear  No fever  No facial pain  Cough is dry   No fever or chills or aches   Otc: zicam nyquil and day quil  2 px ns (steroid and antihistamine)   Patient Active Problem List   Diagnosis Date Noted  . Colon cancer screening 02/05/2018  . Generalized osteoarthritis of hand 12/11/2016  . Stress reaction 12/11/2016  . Estrogen deficiency 05/11/2016  . Encounter for screening mammogram for breast cancer 05/11/2016  . OSA (obstructive sleep apnea) 03/06/2016  . Fatigue 01/25/2016  . Right ankle swelling 10/18/2015  . Left knee pain 10/18/2015  . Snoring 10/18/2015  . Routine general medical examination at a health care facility 02/28/2015  . Encounter for Medicare annual wellness exam 02/06/2013  . Hyperlipidemia 08/27/2012  . Irregular heart beat 08/05/2012  . Gynecological examination 12/20/2010  . BARRETTS ESOPHAGUS 10/01/2010  . Osteopenia 12/17/2009  . BACK PAIN, LUMBAR 07/02/2009  . IRRITABLE BOWEL SYNDROME 04/02/2009  . POSTMENOPAUSAL STATUS 12/10/2008  . Hypothyroidism 12/05/2007  . Meniere's disease 12/05/2007  . GERD 12/05/2007   Past Medical History:  Diagnosis Date  . DDD (degenerative disc disease)    chronic back pain  . ETD (eustachian tube dysfunction)   . GERD (gastroesophageal reflux disease)   . IBS (irritable bowel syndrome)   . Meniere's disease   . Migraine   . Osteoporosis   . Thyroid disease    hypothyroid   Past Surgical History:  Procedure Laterality Date  . APPENDECTOMY    . LUMBAR DISC SURGERY  1984   L5    Social History   Tobacco Use  . Smoking status: Never Smoker  . Smokeless  tobacco: Never Used  Substance Use Topics  . Alcohol use: Yes    Alcohol/week: 1.0 standard drinks    Types: 1 Glasses of wine per week    Comment: wine occasional  . Drug use: No   Family History  Problem Relation Age of Onset  . Diabetes Paternal Aunt   . Hypertension Maternal Grandmother   . Hypertension Maternal Grandfather    Allergies  Allergen Reactions  . Epinephrine   . Nsaids     GI discomfort and gastritis  . Tolmetin Other (See Comments)    GI discomfort and gastritis   Current Outpatient Medications on File Prior to Visit  Medication Sig Dispense Refill  . ALPRAZolam (XANAX) 0.25 MG tablet Take 1 tablet (0.25 mg total) by mouth 2 (two) times daily as needed for anxiety (with caution of sedation). 30 tablet 0  . butalbital-acetaminophen-caffeine (FIORICET) 50-325-40 MG tablet Take 1 tablet by mouth every 6 (six) hours as needed. 90 tablet 0  . Calcium Carb-Cholecalciferol (CALCIUM 600 + D PO) Take 1 tablet by mouth daily.    Marland Kitchen dexlansoprazole (DEXILANT) 60 MG capsule Take 1 capsule (60 mg total) by mouth daily. 90 capsule 3  . diclofenac sodium (VOLTAREN) 1 % GEL APPLY 2 GRAMS TOPICALLY FOUR TIMES A DAY TO AFFECTED AREAS 300 g 3  . estradiol (ESTRACE) 0.1 MG/GM vaginal cream Insert small amount (1 cm) in vagina every other day  42.5 g 1  . levothyroxine (SYNTHROID, LEVOTHROID) 88 MCG tablet Take 1 tablet (88 mcg total) by mouth daily before breakfast. 90 tablet 3  . lidocaine (XYLOCAINE) 5 % ointment APPLY 1 APPLICATION TOPICALLY AS NEEDED 150 g 3  . linaclotide (LINZESS) 145 MCG CAPS capsule Take 1 capsule (145 mcg total) by mouth daily. 90 capsule 3  . mometasone (NASONEX) 50 MCG/ACT nasal spray Place 2 sprays into the nose daily as needed.    . Multiple Vitamin (MULTI VITAMIN PO) Multi Vitamin with Biotin    . pseudoephedrine (SUDAFED) 60 MG tablet Take 60 mg by mouth daily.     . traMADol (ULTRAM) 50 MG tablet Take 50 mg by mouth as needed for pain. Reported on  08/05/2015     No current facility-administered medications on file prior to visit.      Review of Systems  Constitutional: Positive for appetite change and fatigue. Negative for chills, diaphoresis and fever.  HENT: Positive for congestion, postnasal drip, rhinorrhea, sinus pressure, sneezing and sore throat. Negative for ear pain.   Eyes: Negative for pain and discharge.  Respiratory: Positive for cough. Negative for chest tightness, shortness of breath, wheezing and stridor.   Cardiovascular: Negative for chest pain.  Gastrointestinal: Negative for diarrhea, nausea and vomiting.  Genitourinary: Negative for frequency, hematuria and urgency.  Musculoskeletal: Negative for arthralgias and myalgias.  Skin: Negative for rash.  Neurological: Positive for headaches. Negative for dizziness, weakness and light-headedness.  Psychiatric/Behavioral: Negative for confusion and dysphoric mood.       Objective:   Physical Exam Constitutional:      General: She is not in acute distress.    Appearance: Normal appearance. She is well-developed and normal weight. She is not ill-appearing, toxic-appearing or diaphoretic.     Comments: Mildly fatigued   HENT:     Head: Normocephalic and atraumatic.     Comments: Nares are injected and congested    No sinus tenderness    Right Ear: Tympanic membrane, ear canal and external ear normal.     Left Ear: Tympanic membrane, ear canal and external ear normal.     Nose: Congestion and rhinorrhea present.     Comments: Mild clear pnd     Mouth/Throat:     Mouth: Mucous membranes are moist.     Pharynx: Oropharynx is clear. No oropharyngeal exudate or posterior oropharyngeal erythema.  Eyes:     General:        Right eye: No discharge.        Left eye: No discharge.     Conjunctiva/sclera: Conjunctivae normal.     Pupils: Pupils are equal, round, and reactive to light.  Neck:     Musculoskeletal: Normal range of motion and neck supple.    Cardiovascular:     Rate and Rhythm: Normal rate.     Heart sounds: Normal heart sounds.  Pulmonary:     Effort: Pulmonary effort is normal. No respiratory distress.     Breath sounds: Normal breath sounds. No stridor. No wheezing, rhonchi or rales.  Lymphadenopathy:     Cervical: No cervical adenopathy.  Skin:    General: Skin is warm and dry.     Findings: No rash.  Neurological:     Mental Status: She is alert.     Cranial Nerves: No cranial nerve deficit.  Psychiatric:        Mood and Affect: Mood normal.           Assessment & Plan:  Problem List Items Addressed This Visit      Respiratory   Viral URI with cough    Reassuring exam  2 wk of symptoms  If no further imp in 3-4 d- can fill printed px for azithromycin (empiric coverage) Fluids/rest Saline/steam  Antihistamine/nasal spray  Update if not starting to improve in a week or if worsening  -esp worse cough/facial pain or fever      Relevant Medications   azithromycin (ZITHROMAX Z-PAK) 250 MG tablet

## 2018-08-07 NOTE — Patient Instructions (Signed)
Drink lots of fluids  Rest  Try nasal saline spray in combination with other medications   Breathe steam (it helps congestion also)   If no further improvement in 3 days -fill px for antibiotic  Also if you continue to worsen  Watch for fever /shortness of breath/ worse productive

## 2018-08-07 NOTE — Assessment & Plan Note (Signed)
Reassuring exam  2 wk of symptoms  If no further imp in 3-4 d- can fill printed px for azithromycin (empiric coverage) Fluids/rest Saline/steam  Antihistamine/nasal spray  Update if not starting to improve in a week or if worsening  -esp worse cough/facial pain or fever

## 2018-08-26 DIAGNOSIS — D2272 Melanocytic nevi of left lower limb, including hip: Secondary | ICD-10-CM | POA: Diagnosis not present

## 2018-08-26 DIAGNOSIS — D2262 Melanocytic nevi of left upper limb, including shoulder: Secondary | ICD-10-CM | POA: Diagnosis not present

## 2018-08-26 DIAGNOSIS — Z85828 Personal history of other malignant neoplasm of skin: Secondary | ICD-10-CM | POA: Diagnosis not present

## 2018-08-26 DIAGNOSIS — D2261 Melanocytic nevi of right upper limb, including shoulder: Secondary | ICD-10-CM | POA: Diagnosis not present

## 2018-08-26 DIAGNOSIS — L570 Actinic keratosis: Secondary | ICD-10-CM | POA: Diagnosis not present

## 2018-08-26 DIAGNOSIS — D225 Melanocytic nevi of trunk: Secondary | ICD-10-CM | POA: Diagnosis not present

## 2018-08-26 DIAGNOSIS — D2271 Melanocytic nevi of right lower limb, including hip: Secondary | ICD-10-CM | POA: Diagnosis not present

## 2018-08-26 DIAGNOSIS — Z08 Encounter for follow-up examination after completed treatment for malignant neoplasm: Secondary | ICD-10-CM | POA: Diagnosis not present

## 2018-08-26 DIAGNOSIS — X32XXXA Exposure to sunlight, initial encounter: Secondary | ICD-10-CM | POA: Diagnosis not present

## 2018-09-16 DIAGNOSIS — G4733 Obstructive sleep apnea (adult) (pediatric): Secondary | ICD-10-CM | POA: Diagnosis not present

## 2018-09-23 ENCOUNTER — Ambulatory Visit: Payer: Medicare HMO | Admitting: Podiatry

## 2018-09-23 ENCOUNTER — Encounter: Payer: Self-pay | Admitting: Podiatry

## 2018-09-23 DIAGNOSIS — L608 Other nail disorders: Secondary | ICD-10-CM | POA: Diagnosis not present

## 2018-09-23 DIAGNOSIS — L600 Ingrowing nail: Secondary | ICD-10-CM

## 2018-09-23 DIAGNOSIS — L603 Nail dystrophy: Secondary | ICD-10-CM | POA: Diagnosis not present

## 2018-09-23 MED ORDER — NEOMYCIN-POLYMYXIN-HC 1 % OT SOLN: OTIC | 1 refills | Status: DC

## 2018-09-23 NOTE — Progress Notes (Signed)
She presents today chief complaint of painful ingrown toenail right.  She states that both borders on this toenail hurt.  Painful thickened nail hallux left  Objective: Vital signs are stable she alert and oriented x3 pulses are palpable.  Sharp incurvated nail margin on tibiofibular border of the hallux right with mild erythema no purulence no mild drainage.  Thicker hallux nail left particularly along the distal lateral margin.  Cannot rule out onychomycosis.   Assessment: Ingrown toenail tibiofibular border the hallux right.  Thick nail hallux left possibly mycotic.  Plan: Chemical matricectomy was performed today after local anesthesia was administered.  Tolerated the procedure well.  Was given both oral and home-going instructions for care and soaking of the toe as well as a prescription for Cortisporin Otic to be applied twice daily after soaking.  Follow-up with her in 2 weeks.  Samples of the hallux nail left were taken for pathologic evaluation.

## 2018-09-23 NOTE — Patient Instructions (Signed)

## 2018-09-26 DIAGNOSIS — H2513 Age-related nuclear cataract, bilateral: Secondary | ICD-10-CM | POA: Diagnosis not present

## 2018-10-09 ENCOUNTER — Ambulatory Visit: Payer: Medicare HMO | Admitting: Podiatry

## 2018-10-09 ENCOUNTER — Encounter: Payer: Self-pay | Admitting: Podiatry

## 2018-10-09 DIAGNOSIS — L603 Nail dystrophy: Secondary | ICD-10-CM

## 2018-10-09 DIAGNOSIS — Z9889 Other specified postprocedural states: Secondary | ICD-10-CM

## 2018-10-09 DIAGNOSIS — L600 Ingrowing nail: Secondary | ICD-10-CM

## 2018-10-09 NOTE — Progress Notes (Signed)
She presents today for follow-up of her matrixectomy's hallux right.  And for her pathology report.  She states that the toe seems to be doing pretty good.  Objective: Vital signs are stable she alert oriented x3.  Right foot does demonstrate some stains associated with Betadine soaking matrixectomy's have gone on to heal uneventfully.  Pathology reports demonstrate some nail dystrophy only no signs of infection.  Assessment: Well-healing matrixectomy's.  Plan: Continue soak every other day for the next few days until is completely healed and follow-up with me as needed

## 2018-10-28 ENCOUNTER — Telehealth: Payer: Self-pay

## 2018-10-28 NOTE — Telephone Encounter (Signed)
-----   Message from Arlyce Dice sent at 10/28/2018  8:06 AM EDT ----- Patient called in stating that her toe is still red and tender would like for the nurse to call her back to discuss what she can do in the mean time until appointment next week.

## 2018-10-28 NOTE — Telephone Encounter (Signed)
I called and spoke with patient, she stated that her toe is still a little tender and red.  She denied any fever, chills, red streaking.  I informed her to clean with peroxide and let airdry as much as possible and if it becomes worse, she will need to come in for evaluation.   She verbalized understanding

## 2018-11-06 ENCOUNTER — Ambulatory Visit: Payer: Medicare HMO | Admitting: Podiatry

## 2018-12-11 ENCOUNTER — Other Ambulatory Visit: Payer: Self-pay

## 2018-12-11 ENCOUNTER — Ambulatory Visit: Payer: Medicare Other | Admitting: Family Medicine

## 2018-12-11 ENCOUNTER — Telehealth: Payer: Self-pay | Admitting: *Deleted

## 2018-12-11 ENCOUNTER — Emergency Department: Payer: Medicare HMO

## 2018-12-11 ENCOUNTER — Emergency Department
Admission: EM | Admit: 2018-12-11 | Discharge: 2018-12-11 | Disposition: A | Discharge status: Home / Self Care | Payer: Medicare HMO | Attending: Emergency Medicine | Admitting: Emergency Medicine

## 2018-12-11 DIAGNOSIS — R42 Dizziness and giddiness: Secondary | ICD-10-CM | POA: Diagnosis not present

## 2018-12-11 DIAGNOSIS — R55 Syncope and collapse: Secondary | ICD-10-CM | POA: Diagnosis not present

## 2018-12-11 LAB — URINALYSIS, COMPLETE (UACMP) WITH MICROSCOPIC
Bacteria, UA: NONE SEEN
Bilirubin Urine: NEGATIVE
Glucose, UA: NEGATIVE mg/dL
Hgb urine dipstick: NEGATIVE
Ketones, ur: 5 mg/dL — AB
Nitrite: NEGATIVE
Protein, ur: NEGATIVE mg/dL
Specific Gravity, Urine: 1.014 (ref 1.005–1.030)
pH: 6 (ref 5.0–8.0)

## 2018-12-11 LAB — CBC
HCT: 40.9 % (ref 36.0–46.0)
Hemoglobin: 13.5 g/dL (ref 12.0–15.0)
MCH: 29.9 pg (ref 26.0–34.0)
MCHC: 33 g/dL (ref 30.0–36.0)
MCV: 90.5 fL (ref 80.0–100.0)
Platelets: 228 10*3/uL (ref 150–400)
RBC: 4.52 MIL/uL (ref 3.87–5.11)
RDW: 12.9 % (ref 11.5–15.5)
WBC: 10.9 10*3/uL — ABNORMAL HIGH (ref 4.0–10.5)
nRBC: 0 % (ref 0.0–0.2)

## 2018-12-11 LAB — BASIC METABOLIC PANEL
Anion gap: 9 (ref 5–15)
BUN: 16 mg/dL (ref 8–23)
CO2: 26 mmol/L (ref 22–32)
Calcium: 9.3 mg/dL (ref 8.9–10.3)
Chloride: 97 mmol/L — ABNORMAL LOW (ref 98–111)
Creatinine, Ser: 0.58 mg/dL (ref 0.44–1.00)
GFR calc Af Amer: >60 mL/min (ref >60)
GFR calc non Af Amer: >60 mL/min (ref >60)
Glucose, Bld: 177 mg/dL — ABNORMAL HIGH (ref 70–99)
Potassium: 3.9 mmol/L (ref 3.5–5.1)
Sodium: 132 mmol/L — ABNORMAL LOW (ref 135–145)

## 2018-12-11 LAB — TROPONIN I: Troponin I: <0.03 ng/mL (ref <0.03)

## 2018-12-11 LAB — GLUCOSE, CAPILLARY: Glucose-Capillary: 154 mg/dL — ABNORMAL HIGH (ref 70–99)

## 2018-12-11 MED ORDER — IOHEXOL 350 MG/ML SOLN
75.0000 mL | Freq: Once | INTRAVENOUS | Status: AC | PRN
  Administered 2018-12-11: 75 mL via INTRAVENOUS

## 2018-12-11 NOTE — ED Triage Notes (Signed)
Syncope at approx 12PM today, sitting down when it occurred. No hx of such. States that the episode lasted for only moments and she also had urinary incontinence. Pt states that she has problems with back and saw the chiropractor today. Pt alert and oriented X4, active, cooperative, pt in NAD. RR even and unlabored, color WNL.

## 2018-12-11 NOTE — Telephone Encounter (Signed)
She is in the ED now.  

## 2018-12-11 NOTE — Telephone Encounter (Addendum)
Patient called stating that she passed out while sitting at the table while eating. Patient stated that she went to her chiropractor today and had an adjustment because she is having neck pain. Patient stated that she passed out and her husband caught her. Patient stated that she is still feeling lightheaded.  After speaking with Dr. Glori Bickers patient was advised to go to the ER if she is having any symptoms. Patient did not want to go to the ER, so appointment was scheduled for her to see Dr. Glori Bickers in the morning. Patient stated that she will have her husband drive her to the ER, but if she gets to feeling okay she will not proceed to be seen at the ER.

## 2018-12-11 NOTE — ED Provider Notes (Signed)
[facility] Emergency Department Provider Note  ____________________________________________   First MD Initiated Contact with Patient 12/11/18 1539     (approximate)  I have reviewed the triage vital signs and the nursing notes.   HISTORY  Chief Complaint Loss of Consciousness    HPI Sarah Harper is a 78 y.o. female presents emergency department after a syncopal episode while at home.  She states she was sitting at the table after lunch and her husband states that she passed out.  He caught her before she fell to the ground.  She lost control of her bladder and pants saturated her pants.  She states earlier today that she went to the chiropractor and had a neck adjustment for neck pain.  She denies chest pain or shortness of breath but states she has a history of GERD.  She denies any history of seizures or syncope.  She denies that she had any slurred speech or strokelike symptoms.    Past Medical History:  Diagnosis Date  . DDD (degenerative disc disease)    chronic back pain  . ETD (eustachian tube dysfunction)   . GERD (gastroesophageal reflux disease)   . IBS (irritable bowel syndrome)   . Meniere's disease   . Migraine   . Osteoporosis   . Thyroid disease    hypothyroid    Patient Active Problem List   Diagnosis Date Noted  . Viral URI with cough 08/07/2018  . Colon cancer screening 02/05/2018  . Degenerative joint disease (DJD) of lumbar spine 05/02/2017  . Internal hemorrhoids 05/02/2017  . Generalized osteoarthritis of hand 12/11/2016  . Stress reaction 12/11/2016  . Estrogen deficiency 05/11/2016  . Encounter for screening mammogram for breast cancer 05/11/2016  . OSA (obstructive sleep apnea) 03/06/2016  . Fatigue 01/25/2016  . Right ankle swelling 10/18/2015  . Left knee pain 10/18/2015  . Snoring 10/18/2015  . Routine general medical examination at a health care facility 02/28/2015  . Encounter for Medicare annual wellness  exam 02/06/2013  . Hyperlipidemia 08/27/2012  . Irregular heart beat 08/05/2012  . Gynecological examination 12/20/2010  . BARRETTS ESOPHAGUS 10/01/2010  . Osteopenia 12/17/2009  . BACK PAIN, LUMBAR 07/02/2009  . IRRITABLE BOWEL SYNDROME 04/02/2009  . POSTMENOPAUSAL STATUS 12/10/2008  . Hypothyroidism 12/05/2007  . Meniere's disease 12/05/2007  . GERD 12/05/2007    Past Surgical History:  Procedure Laterality Date  . APPENDECTOMY    . LUMBAR DISC SURGERY  1984   L5     Prior to Admission medications   Medication Sig Start Date End Date Taking? Authorizing Provider  ALPRAZolam (XANAX) 0.25 MG tablet Take 1 tablet (0.25 mg total) by mouth 2 (two) times daily as needed for anxiety (with caution of sedation). 12/11/16  Yes Tower, [name], MD  butalbital-acetaminophen-caffeine (FIORICET) 50-325-40 MG tablet Take 1 tablet by mouth every 6 (six) hours as needed. 03/06/16  Yes Tower, [name], MD  Calcium Carb-Cholecalciferol (CALCIUM 600 + D PO) Take 1 tablet by mouth daily.   Yes [provider]  dexlansoprazole (DEXILANT) 60 MG capsule Take 1 capsule (60 mg total) by mouth daily. 02/05/18  Yes Tower, Marne A, MD  diclofenac sodium (VOLTAREN) 1 % GEL APPLY 2 GRAMS TOPICALLY FOUR TIMES A DAY TO AFFECTED AREAS 05/24/18  Yes Tower, [name], MD  estradiol (ESTRACE) 0.1 MG/GM vaginal cream Insert small amount (1 cm) in vagina every other day 02/05/18  Yes Tower, [name], MD  levothyroxine (SYNTHROID, LEVOTHROID) 88 MCG tablet Take  1 tablet (88 mcg total) by mouth daily before breakfast. 02/05/18  Yes Tower, [name], MD  linaclotide (LINZESS) 145 MCG CAPS capsule Take 1 capsule (145 mcg total) by mouth daily. 02/05/18  Yes Tower, Marne A, MD  mometasone (NASONEX) 50 MCG/ACT nasal spray Place 2 sprays into the nose daily as needed.   Yes [provider]  Multiple Vitamin (MULTI VITAMIN PO) Multi Vitamin with Biotin   Yes [provider]  pseudoephedrine (SUDAFED) 60 MG tablet  Take 60 mg by mouth daily.    Yes [provider]  traMADol (ULTRAM) 50 MG tablet Take 50 mg by mouth as needed for pain. Reported on 08/05/2015   Yes [provider]    Allergies Nsaids and Tolmetin  Family History  Problem Relation Age of Onset  . Diabetes Paternal Aunt   . Hypertension Maternal Grandmother   . Hypertension Maternal Grandfather     Social History Social History   Tobacco Use  . Smoking status: Never Smoker  . Smokeless tobacco: Never Used  Substance Use Topics  . Alcohol use: Yes    Alcohol/week: 1.0 standard drinks    Types: 1 Glasses of wine per week    Comment: wine occasional  . Drug use: No    Review of Systems  Constitutional: No fever/chills, positive syncope Eyes: No visual changes. ENT: No sore throat. Respiratory: Denies cough Cardiovascular: Denies chest pain or shortness of breath, positive syncopal episode Genitourinary: Negative for dysuria. Musculoskeletal: Negative for back pain. Skin: Negative for rash.    ____________________________________________   PHYSICAL EXAM:  VITAL SIGNS: ED Triage Vitals  Enc Vitals Group     BP 12/11/18 1403 (!) 112/92     Pulse Rate 12/11/18 1403 74     Resp 12/11/18 1403 18     Temp 12/11/18 1403 98.1 F (36.7 C)     Temp Source 12/11/18 1403 Oral     SpO2 12/11/18 1403 100 %     Weight 12/11/18 1404 132 lb (59.9 kg)     Height 12/11/18 1404 5\' 6"  (1.676 m)     Head Circumference --      Peak Flow --      Pain Score 12/11/18 1404 4     Pain Loc --      Pain Edu? --      Excl. in [place]? --     Constitutional: Alert and oriented. Well appearing and in no acute distress. Eyes: Conjunctivae are normal.  Head: Atraumatic. Nose: No congestion/rhinnorhea. Mouth/Throat: Mucous membranes are moist.   Neck:  supple no lymphadenopathy noted Cardiovascular: Normal rate, regular rhythm. Heart sounds are normal Respiratory: Normal respiratory effort.  No retractions, lungs c t a   Abd: soft nontender bs normal all 4 quad GU: deferred Musculoskeletal: FROM all extremities, warm and well perfused Neurologic:  Normal speech and language.  Cranial nerves II through XII grossly intact Skin:  Skin is warm, dry and intact. No rash noted. Psychiatric: Mood and affect are normal. Speech and behavior are normal.  ____________________________________________   LABS (all labs ordered are listed, but only abnormal results are displayed)  Labs Reviewed  BASIC METABOLIC PANEL - Abnormal; Notable for the following components:      Result Value   Sodium 132 (*)    Chloride 97 (*)    Glucose, Bld 177 (*)    All other components within normal limits  CBC - Abnormal; Notable for the following components:   WBC 10.9 (*)  All other components within normal limits  URINALYSIS, COMPLETE (UACMP) WITH MICROSCOPIC - Abnormal; Notable for the following components:   Color, Urine AMBER (*)    APPearance CLOUDY (*)    Ketones, ur 5 (*)    Leukocytes,Ua TRACE (*)    All other components within normal limits  GLUCOSE, CAPILLARY - Abnormal; Notable for the following components:   Glucose-Capillary 154 (*)    All other components within normal limits  TROPONIN I   ____________________________________________   ____________________________________________  RADIOLOGY  CT of the head  ____________________________________________   PROCEDURES  Procedure(s) performed: No  Procedures    ____________________________________________   INITIAL IMPRESSION / ASSESSMENT AND PLAN / ED COURSE  Pertinent labs & imaging results that were available during my care of the patient were reviewed by me and considered in my medical decision making (see chart for details).   Patient 78 year old female presents emergency department after syncopal episode with urinary incontinence.  Previous visit today to the chiropractor for neck adjustment.  She denies chest pain or shortness of  breath.  Physical exam patient appears well she does not have any slurred speech and is talkative and able to answer all questions.  Grips are equal bilaterally.  Differential diagnosis includes stroke, seizure, syncope of unknown cause, MI   CT of the head is negative CTA of the head and neck are both normal  Urinalysis shows 5 ketones, CBC is normal, troponin is normal, basic metabolic panel is normal, point-of-care glucose is 154  All results were explained to the patient.  Dr. Joni Fears and see the patient.  Patient was discharged in stable condition.  As part of my medical decision making, I reviewed the following data within the [place] notes reviewed and incorporated, Labs reviewed see above, EKG interpreted NSR, Old chart reviewed, Radiograph reviewed CT of the head and CTA of the head neck are all normal, Evaluated by EM attending Dr. Joni Fears, Notes from prior ED visits and [place] Controlled Substance Database  ____________________________________________   FINAL CLINICAL IMPRESSION(S) / ED DIAGNOSES  Final diagnoses:  Syncope, unspecified syncope type      NEW MEDICATIONS STARTED DURING THIS VISIT:  New Prescriptions   No medications on file     Note:  This document was prepared using Dragon voice recognition software and may include unintentional dictation errors.    Versie Starks, PA-C 12/11/18 1742    Carrie Mew, MD 12/11/18 [phone]

## 2018-12-11 NOTE — Discharge Instructions (Addendum)
Follow-up with your regular doctor in 2 days if you are not feeling better.  Return the emergency department if you have another syncopal episode.  Drink plenty of water and get plenty of rest.

## 2018-12-12 ENCOUNTER — Ambulatory Visit (INDEPENDENT_AMBULATORY_CARE_PROVIDER_SITE_OTHER): Payer: Medicare HMO | Admitting: Family Medicine

## 2018-12-12 ENCOUNTER — Encounter: Payer: Self-pay | Admitting: Family Medicine

## 2018-12-12 VITALS — BP 122/82 | HR 80 | Temp 97.8°F | Ht 65.0 in | Wt 130.0 lb

## 2018-12-12 DIAGNOSIS — K2100 Gastro-esophageal reflux disease with esophagitis, without bleeding: Secondary | ICD-10-CM

## 2018-12-12 DIAGNOSIS — R55 Syncope and collapse: Secondary | ICD-10-CM | POA: Diagnosis not present

## 2018-12-12 NOTE — Assessment & Plan Note (Signed)
Yesterday-seen in ED with reassuring w/u  Also back in 2016 (nl cardiac w/u) Today felt vasovagal and did not faint Nl exam except for mild head tremor  Ref to neurology for further eval  Disc using care in positional change  Also eat/drink slowly and change position  Update if symptoms continue

## 2018-12-12 NOTE — Progress Notes (Signed)
Subjective:    Patient ID: Sarah Harper, female    DOB: 20-Oct-1940, 78 y.o.   MRN: [id number]  HPI Here for f/u of ED visit for syncope yesterday   She had a chiropractic neck adjustment in am Passes out sitting at the table eating with her husband Husband said she looked "spacey" for a few moments  He caught her before she fell to the ground  She did not even realize she passed out  She lost continence of urine during event - then had to have an urgent bp after that (no blood)   No seizure activity seen   Ct Angio Head W Or Wo Contrast  Result Date: 12/11/2018 CLINICAL DATA:  Syncope at approximately 12 p.m. today, with persistent lightheadedness. Patient went to chiropractor earlier today, and received a "neck adjustment." EXAM: CT ANGIOGRAPHY HEAD AND NECK TECHNIQUE: Multidetector CT imaging of the head and neck was performed using the standard protocol during bolus administration of intravenous contrast. Multiplanar CT image reconstructions and MIPs were obtained to evaluate the vascular anatomy. Carotid stenosis measurements (when applicable) are obtained utilizing NASCET criteria, using the distal internal carotid diameter as the denominator. CONTRAST:  51mL OMNIPAQUE IOHEXOL 350 MG/ML SOLN COMPARISON:  CT head performed at 1551 hours is negative. FINDINGS: CTA NECK FINDINGS Aortic arch: Aberrant RIGHT subclavian, and bovine trunk. No proximal stenosis. Calcific plaque origin LEFT subclavian. Right carotid system: No evidence of dissection, stenosis (50% or greater) or occlusion. Nonstenotic atheromatous calcification at the bifurcation. Left carotid system: No evidence of dissection, stenosis (50% or greater) or occlusion. Nonstenotic atheromatous calcification at the bifurcation. Vertebral arteries: Codominant. No evidence of dissection, stenosis (50% or greater) or occlusion. Skeleton: Spondylosis, most pronounced at C5-6 and C6-7. Degenerative anterolisthesis C3-4, C4-5, and C7-T1  is facet mediated. Within limits for assessment on CTA imaging, no visible fracture. Other neck: No visible hematoma. No neck masses. Upper chest: No pneumothorax or mass. Review of the MIP images confirms the above findings CTA HEAD FINDINGS Anterior circulation: No significant stenosis, proximal occlusion, aneurysm, or vascular malformation. Posterior circulation: No significant stenosis, proximal occlusion, aneurysm, or vascular malformation. Venous sinuses: As permitted by contrast timing, patent. Anatomic variants: None of significance. Delayed phase: Not performed. Review of the MIP images confirms the above findings IMPRESSION: 1. No flow reducing extracranial or intracranial stenosis or occlusion. Ordinary calcific atheromatous change at the carotid bifurcations, without significant stenosis. 2. No visible craniocervical vessel dissection or injury status post chiropractic manipulation. 3. No visible saccular aneurysm or vascular malformation. 4. Cervical spondylosis. Electronically Signed   By: Staci Righter M.D.   On: 12/11/2018 17:20   Ct Head Wo Contrast  Result Date: 12/11/2018 CLINICAL DATA:  78 year old female with a history of syncope EXAM: CT HEAD WITHOUT CONTRAST TECHNIQUE: Contiguous axial images were obtained from the base of the skull through the vertex without intravenous contrast. COMPARISON:  None. FINDINGS: Brain: No acute intracranial hemorrhage. No midline shift or mass effect. Gray-white differentiation maintained. Unremarkable appearance of the ventricular system. Vascular: Unremarkable. Skull: No acute fracture.  No aggressive bone lesion identified. Sinuses/Orbits: Unremarkable appearance of the orbits. Mastoid air cells clear. No middle ear effusion. No significant sinus disease. Other: None IMPRESSION: Negative head CT Electronically Signed   By: Corrie Mckusick D.O.   On: 12/11/2018 15:56   Ct Angio Neck W And/or Wo Contrast  Result Date: 12/11/2018 CLINICAL DATA:  Syncope at  approximately 12 p.m. today, with persistent lightheadedness. Patient went to chiropractor earlier  today, and received a "neck adjustment." EXAM: CT ANGIOGRAPHY HEAD AND NECK TECHNIQUE: Multidetector CT imaging of the head and neck was performed using the standard protocol during bolus administration of intravenous contrast. Multiplanar CT image reconstructions and MIPs were obtained to evaluate the vascular anatomy. Carotid stenosis measurements (when applicable) are obtained utilizing NASCET criteria, using the distal internal carotid diameter as the denominator. CONTRAST:  50mL OMNIPAQUE IOHEXOL 350 MG/ML SOLN COMPARISON:  CT head performed at 1551 hours is negative. FINDINGS: CTA NECK FINDINGS Aortic arch: Aberrant RIGHT subclavian, and bovine trunk. No proximal stenosis. Calcific plaque origin LEFT subclavian. Right carotid system: No evidence of dissection, stenosis (50% or greater) or occlusion. Nonstenotic atheromatous calcification at the bifurcation. Left carotid system: No evidence of dissection, stenosis (50% or greater) or occlusion. Nonstenotic atheromatous calcification at the bifurcation. Vertebral arteries: Codominant. No evidence of dissection, stenosis (50% or greater) or occlusion. Skeleton: Spondylosis, most pronounced at C5-6 and C6-7. Degenerative anterolisthesis C3-4, C4-5, and C7-T1 is facet mediated. Within limits for assessment on CTA imaging, no visible fracture. Other neck: No visible hematoma. No neck masses. Upper chest: No pneumothorax or mass. Review of the MIP images confirms the above findings CTA HEAD FINDINGS Anterior circulation: No significant stenosis, proximal occlusion, aneurysm, or vascular malformation. Posterior circulation: No significant stenosis, proximal occlusion, aneurysm, or vascular malformation. Venous sinuses: As permitted by contrast timing, patent. Anatomic variants: None of significance. Delayed phase: Not performed. Review of the MIP images confirms the  above findings IMPRESSION: 1. No flow reducing extracranial or intracranial stenosis or occlusion. Ordinary calcific atheromatous change at the carotid bifurcations, without significant stenosis. 2. No visible craniocervical vessel dissection or injury status post chiropractic manipulation. 3. No visible saccular aneurysm or vascular malformation. 4. Cervical spondylosis. Electronically Signed   By: Staci Righter M.D.   On: 12/11/2018 17:20    Re assuring imaging   EKG showed NSR with rate of 71 and low voltage QRS  Results for orders placed or performed during the hospital encounter of 67/59/16  Basic metabolic panel  Result Value Ref Range   Sodium 132 (L) 135 - 145 mmol/L   Potassium 3.9 3.5 - 5.1 mmol/L   Chloride 97 (L) 98 - 111 mmol/L   CO2 26 22 - 32 mmol/L   Glucose, Bld 177 (H) 70 - 99 mg/dL   BUN 16 8 - 23 mg/dL   Creatinine, Ser 0.58 0.44 - 1.00 mg/dL   Calcium 9.3 8.9 - 10.3 mg/dL   GFR calc non Af Amer >60 >60 mL/min   GFR calc Af Amer >60 >60 mL/min   Anion gap 9 5 - 15  CBC  Result Value Ref Range   WBC 10.9 (H) 4.0 - 10.5 K/uL   RBC 4.52 3.87 - 5.11 MIL/uL   Hemoglobin 13.5 12.0 - 15.0 g/dL   HCT 40.9 36.0 - 46.0 %   MCV 90.5 80.0 - 100.0 fL   MCH 29.9 26.0 - 34.0 pg   MCHC 33.0 30.0 - 36.0 g/dL   RDW 12.9 11.5 - 15.5 %   Platelets 228 150 - 400 K/uL   nRBC 0.0 0.0 - 0.2 %  Urinalysis, Complete w Microscopic  Result Value Ref Range   Color, Urine AMBER (A) YELLOW   APPearance CLOUDY (A) CLEAR   Specific Gravity, Urine 1.014 1.005 - 1.030   pH 6.0 5.0 - 8.0   Glucose, UA NEGATIVE NEGATIVE mg/dL   Hgb urine dipstick NEGATIVE NEGATIVE   Bilirubin Urine NEGATIVE NEGATIVE  Ketones, ur 5 (A) NEGATIVE mg/dL   Protein, ur NEGATIVE NEGATIVE mg/dL   Nitrite NEGATIVE NEGATIVE   Leukocytes,Ua TRACE (A) NEGATIVE   RBC / HPF 0-5 0 - 5 RBC/hpf   WBC, UA 0-5 0 - 5 WBC/hpf   Bacteria, UA NONE SEEN NONE SEEN   Squamous Epithelial / LPF 0-5 0 - 5   Mucus PRESENT     Glucose, capillary  Result Value Ref Range   Glucose-Capillary 154 (H) 70 - 99 mg/dL  Troponin I - Add-On to previous collection  Result Value Ref Range   Troponin I <0.03 <0.03 ng/mL    ED report A/P: Patient 78 year old female presents emergency department after syncopal episode with urinary incontinence.  Previous visit today to the chiropractor for neck adjustment.  She denies chest pain or shortness of breath.  Physical exam patient appears well she does not have any slurred speech and is talkative and able to answer all questions.  Grips are equal bilaterally.  Differential diagnosis includes stroke, seizure, syncope of unknown cause, MI   CT of the head is negative CTA of the head and neck are both normal  Urinalysis shows 5 ketones, CBC is normal, troponin is normal, basic metabolic panel is normal, point-of-care glucose is 154  All results were explained to the patient.  Dr. Joni Fears and see the patient.  Patient was discharged in stable condition.  As part of my medical decision making, I reviewed the following data within the [place] notes reviewed and incorporated, Labs reviewed see above, EKG interpreted NSR, Old chart reviewed, Radiograph reviewed CT of the head and CTA of the head neck are all normal, Evaluated by EM attending Dr. Joni Fears, Notes from prior ED visits and [place] Controlled Substance Database  Neck is much after her chiropractic manipulation She got home  Had dinner with no problems  She slept at at 45 deg angle for her neck pain /this helps This am -she felt it happening again after her breakfast (warning) -felt queasy and urge to urinate and light headed and needed to have a bm  She went to the bathroom and then went back to bed and lay down for 20 minutes    Today: Vitals are stable and nl  BP Readings from Last 3 Encounters:  12/12/18 122/82  12/11/18 (!) 118/98  08/07/18 120/82   Pulse Readings from Last 3  Encounters:  12/12/18 80  12/11/18 84  08/07/18 73   Pulse ox 98% Temp 97.8  Pt did have syncope in 2016 Saw Dr Rockey Situ  At the time bp was possibly low -and florinef was suggested  It was a similar situation at that time- fainting while eating sitting at a table  She kept watching her bp  Never found any hypotension   Has never seen a neurologist or had EEG  She has meniere's dz   She feels a little weak right now  Not nauseated  She has some GERD symptoms (her reflux is stress related) -and has been worse lately  20 yo mom in asst living Pandemic scare  husb had knee surgery  Takes dexilant - it has been the best thing for her   Patient Active Problem List   Diagnosis Date Noted   Syncope and collapse 12/12/2018   Colon cancer screening 02/05/2018   Degenerative joint disease (DJD) of lumbar spine 05/02/2017   Internal hemorrhoids 05/02/2017   Generalized osteoarthritis of hand 12/11/2016   Stress reaction 12/11/2016  Estrogen deficiency 05/11/2016   Encounter for screening mammogram for breast cancer 05/11/2016   OSA (obstructive sleep apnea) 03/06/2016   Fatigue 01/25/2016   Right ankle swelling 10/18/2015   Left knee pain 10/18/2015   Snoring 10/18/2015   Routine general medical examination at a health care facility 02/28/2015   Encounter for Medicare annual wellness exam 02/06/2013   Hyperlipidemia 08/27/2012   Irregular heart beat 08/05/2012   Gynecological examination 12/20/2010   BARRETTS ESOPHAGUS 10/01/2010   Osteopenia 12/17/2009   BACK PAIN, LUMBAR 07/02/2009   IRRITABLE BOWEL SYNDROME 04/02/2009   POSTMENOPAUSAL STATUS 12/10/2008   Hypothyroidism 12/05/2007   Meniere's disease 12/05/2007   GERD 12/05/2007   Past Medical History:  Diagnosis Date   DDD (degenerative disc disease)    chronic back pain   ETD (eustachian tube dysfunction)    GERD (gastroesophageal reflux disease)    IBS (irritable bowel syndrome)      Meniere's disease    Migraine    Osteoporosis    Thyroid disease    hypothyroid   Past Surgical History:  Procedure Laterality Date   APPENDECTOMY     LUMBAR DISC SURGERY  1984   L5    Social History   Tobacco Use   Smoking status: Never Smoker   Smokeless tobacco: Never Used  Substance Use Topics   Alcohol use: Yes    Alcohol/week: 1.0 standard drinks    Types: 1 Glasses of wine per week    Comment: wine occasional   Drug use: No   Family History  Problem Relation Age of Onset   Diabetes Paternal Aunt    Hypertension Maternal Grandmother    Hypertension Maternal Grandfather    Allergies  Allergen Reactions   Nsaids     GI discomfort and gastritis   Tolmetin Other (See Comments)    GI discomfort and gastritis   Current Outpatient Medications on File Prior to Visit  Medication Sig Dispense Refill   ALPRAZolam (XANAX) 0.25 MG tablet Take 1 tablet (0.25 mg total) by mouth 2 (two) times daily as needed for anxiety (with caution of sedation). 30 tablet 0   butalbital-acetaminophen-caffeine (FIORICET) 50-325-40 MG tablet Take 1 tablet by mouth every 6 (six) hours as needed. 90 tablet 0   Calcium Carb-Cholecalciferol (CALCIUM 600 + D PO) Take 1 tablet by mouth daily.     dexlansoprazole (DEXILANT) 60 MG capsule Take 1 capsule (60 mg total) by mouth daily. 90 capsule 3   diclofenac sodium (VOLTAREN) 1 % GEL APPLY 2 GRAMS TOPICALLY FOUR TIMES A DAY TO AFFECTED AREAS 300 g 3   estradiol (ESTRACE) 0.1 MG/GM vaginal cream Insert small amount (1 cm) in vagina every other day 42.5 g 1   levothyroxine (SYNTHROID, LEVOTHROID) 88 MCG tablet Take 1 tablet (88 mcg total) by mouth daily before breakfast. 90 tablet 3   linaclotide (LINZESS) 145 MCG CAPS capsule Take 1 capsule (145 mcg total) by mouth daily. 90 capsule 3   mometasone (NASONEX) 50 MCG/ACT nasal spray Place 2 sprays into the nose daily as needed.     Multiple Vitamin (MULTI VITAMIN PO) Multi  Vitamin with Biotin     pseudoephedrine (SUDAFED) 60 MG tablet Take 60 mg by mouth daily.      traMADol (ULTRAM) 50 MG tablet Take 50 mg by mouth as needed for pain. Reported on 08/05/2015     No current facility-administered medications on file prior to visit.      Review of Systems  Constitutional: Negative for activity  change, appetite change, fatigue, fever and unexpected weight change.  HENT: Negative for congestion, ear pain, rhinorrhea, sinus pressure and sore throat.   Eyes: Negative for pain, redness and visual disturbance.  Respiratory: Negative for cough, shortness of breath and wheezing.   Cardiovascular: Negative for chest pain and palpitations.  Gastrointestinal: Negative for abdominal pain, blood in stool, constipation and diarrhea.       Heartburn/reflux  Endocrine: Negative for polydipsia and polyuria.  Genitourinary: Negative for dysuria, frequency and urgency.  Musculoskeletal: Positive for neck pain. Negative for arthralgias, back pain and myalgias.  Skin: Negative for pallor and rash.  Allergic/Immunologic: Negative for environmental allergies.  Neurological: Positive for syncope. Negative for dizziness, tremors, seizures, facial asymmetry, speech difficulty, weakness, light-headedness, numbness and headaches.       Not dizzy now   Hematological: Negative for adenopathy. Does not bruise/bleed easily.  Psychiatric/Behavioral: Negative for decreased concentration and dysphoric mood. The patient is not nervous/anxious.        Objective:   Physical Exam Constitutional:      General: She is not in acute distress.    Appearance: Normal appearance. She is well-developed and normal weight. She is not ill-appearing, toxic-appearing or diaphoretic.  HENT:     Head: Normocephalic and atraumatic.     Comments: No sinus tenderness    Right Ear: Tympanic membrane, ear canal and external ear normal.     Left Ear: Tympanic membrane, ear canal and external ear normal.      Nose: Nose normal.     Mouth/Throat:     Mouth: Mucous membranes are moist.     Pharynx: Oropharynx is clear. No oropharyngeal exudate.  Eyes:     General: No scleral icterus.       Right eye: No discharge.        Left eye: No discharge.     Conjunctiva/sclera: Conjunctivae normal.     Pupils: Pupils are equal, round, and reactive to light.     Comments: No nystagmus  Neck:     Musculoskeletal: Full passive range of motion without pain, normal range of motion and neck supple.     Thyroid: No thyromegaly.     Vascular: No carotid bruit or JVD.     Trachea: No tracheal deviation.  Cardiovascular:     Rate and Rhythm: Normal rate and regular rhythm.     Heart sounds: Normal heart sounds. No murmur.  Pulmonary:     Effort: Pulmonary effort is normal. No respiratory distress.     Breath sounds: Normal breath sounds. No wheezing or rales.  Abdominal:     General: Bowel sounds are normal. There is no distension.     Palpations: Abdomen is soft. There is no mass.     Tenderness: There is no abdominal tenderness.  Musculoskeletal:        General: No tenderness.  Lymphadenopathy:     Cervical: No cervical adenopathy.  Skin:    General: Skin is warm and dry.     Coloration: Skin is not pale.     Findings: No rash.  Neurological:     General: No focal deficit present.     Mental Status: She is alert and oriented to person, place, and time.     Cranial Nerves: No cranial nerve deficit, dysarthria or facial asymmetry.     Sensory: Sensation is intact. No sensory deficit.     Motor: Motor function is intact. No tremor, atrophy or abnormal muscle tone.     Coordination:  Coordination is intact. Romberg sign negative. Coordination normal. Finger-Nose-Finger Test normal.     Gait: Gait normal.     Deep Tendon Reflexes: Reflexes are normal and symmetric. Reflexes normal.     Comments: No focal cerebellar signs   Psychiatric:        Behavior: Behavior normal.        Thought Content: Thought  content normal.           Assessment & Plan:   Problem List Items Addressed This Visit      Cardiovascular and Mediastinum   Syncope and collapse - Primary    Yesterday-seen in ED with reassuring w/u  Also back in 2016 (nl cardiac w/u) Today felt vasovagal and did not faint Nl exam except for mild head tremor  Ref to neurology for further eval  Disc using care in positional change  Also eat/drink slowly and change position  Update if symptoms continue       Relevant Orders   Ambulatory referral to Neurology     Digestive   GERD    Dr Allyn Kenner moved Needs new GI provider- req Dr Carlean Purl      Relevant Orders   Ambulatory referral to Gastroenterology

## 2018-12-12 NOTE — Patient Instructions (Addendum)
I want to refer you to neurology for fainting (syncope)  Also to GI for ongoing reflux issues   Keep hydrated and also don't miss meals  Sit in a different position or area for eating and have your husband close by  Continue your reflux medicine  If symptoms worsen please let me know   Take care of yourself

## 2018-12-12 NOTE — Assessment & Plan Note (Signed)
Dr Allyn Kenner moved Needs new GI provider- req Dr Carlean Purl

## 2018-12-17 DIAGNOSIS — R569 Unspecified convulsions: Secondary | ICD-10-CM | POA: Diagnosis not present

## 2018-12-17 DIAGNOSIS — R4020 Unspecified coma: Secondary | ICD-10-CM | POA: Diagnosis not present

## 2018-12-19 DIAGNOSIS — G4733 Obstructive sleep apnea (adult) (pediatric): Secondary | ICD-10-CM | POA: Diagnosis not present

## 2018-12-26 ENCOUNTER — Encounter: Payer: Self-pay | Admitting: General Surgery

## 2018-12-30 ENCOUNTER — Encounter: Payer: Self-pay | Admitting: Internal Medicine

## 2018-12-30 ENCOUNTER — Other Ambulatory Visit: Payer: Self-pay

## 2018-12-30 ENCOUNTER — Other Ambulatory Visit (HOSPITAL_COMMUNITY): Payer: Self-pay

## 2018-12-30 ENCOUNTER — Ambulatory Visit (INDEPENDENT_AMBULATORY_CARE_PROVIDER_SITE_OTHER): Payer: Medicare HMO | Admitting: Internal Medicine

## 2018-12-30 VITALS — Ht 65.0 in | Wt 130.0 lb

## 2018-12-30 DIAGNOSIS — F439 Reaction to severe stress, unspecified: Secondary | ICD-10-CM

## 2018-12-30 DIAGNOSIS — K219 Gastro-esophageal reflux disease without esophagitis: Secondary | ICD-10-CM

## 2018-12-30 DIAGNOSIS — R12 Heartburn: Secondary | ICD-10-CM

## 2018-12-30 DIAGNOSIS — R1314 Dysphagia, pharyngoesophageal phase: Secondary | ICD-10-CM

## 2018-12-30 DIAGNOSIS — R69 Illness, unspecified: Secondary | ICD-10-CM | POA: Diagnosis not present

## 2018-12-30 DIAGNOSIS — R1310 Dysphagia, unspecified: Secondary | ICD-10-CM

## 2018-12-30 DIAGNOSIS — M489 Spondylopathy, unspecified: Secondary | ICD-10-CM

## 2018-12-30 MED ORDER — SUCRALFATE 1 G PO TABS: 1.0000 g | ORAL_TABLET | Freq: Three times a day (TID) | ORAL | 2 refills | Status: DC

## 2018-12-30 NOTE — Patient Instructions (Signed)
It was good to speak to you today.  I am sorry you are having the problems with swallowing and heartburn.  Not to mention the stress of these times during the pandemic restrictions.   As we discussed I have sent a prescription for Carafate also known as sucralfate to be taken with meals and at bedtime.  It is a large pill.  Sometimes people find it can work better if you dissolve the pill in 2 teaspoons or a tablespoon of water and swallow it that way.  I did not put that in the prescription information but keep that in mind.  We will see about scheduling a modified barium swallow to understand the swallowing problems.  Once I see those results we will contact you with the next steps.  Should things worsen or fail to improve in the interim please do not hesitate to contact me through my chart or by calling our office.  I appreciate the opportunity to care for you. Gatha Mayer, MD, [facility]

## 2018-12-30 NOTE — Progress Notes (Signed)
TELEHEALTH ENCOUNTER IN SETTING OF COVID-19 PANDEMIC - REQUESTED BY PATIENT SERVICE PROVIDED BY TELEMEDECINE - TYPE: Telephone - failed AV - Zoom PATIENT LOCATION: Home PATIENT HAS CONSENTED TO TELEHEALTH VISIT PROVIDER LOCATION: OFFICE REFERRING PROVIDER: Abner Greenspan, MD  PARTICIPANTS OTHER THAN PATIENT:None TIME SPENT ON CALL:20 mins    Sarah Harper 78 y.o. September 05, 1940 [id number]  Assessment & Plan:   Encounter Diagnoses  Name Primary?   Dysphagia, pharyngoesophageal phase Yes   Gastroesophageal reflux disease, esophagitis presence not specified    Heartburn    Cervical spine disease    Situational stress    Longstanding history of reflux disease, perhaps situational stress is flaring that.  I think her dysphagia problem could be at least in part from cervical spine degenerative disease and spurs.  No recent syncope raises some question of neurologic events, seems unlikely to be a source of this pharyngoesophageal dysphagia but it is possible.  To treat her GERD I am adding Carafate 1 g with meals and at bedtime. Also may use as needed Pepcid nightly and antacids.  Sounds like she follows lifestyle recommendations very well.  Diagnostic work-up with modified barium swallow.  Sounds like she might be having some aspiration issues.  Further plans pending this.   Subjective:   Chief Complaint: Reflux and dysphagia  HPI The patient is a 78 year old white woman with a long history of gastroesophageal reflux disease as well as irritable bowel syndrome and hemorrhoids, previously cared for by Dr. Verl Blalock and then Dr. Richmond Campbell.  She has requested a visit because of heartburn symptoms and swallowing difficulty.  Over the past several years she has been a patient of Dr. Earlean Shawl, and things had gotten "under control" using Dexilant 60 mg daily.  She last had an EGD and savory dilation to 18 mm for dysphagia without stricture in May 2018.  She has a known small  to medium hiatal hernia.  She reports being under increased stress with the COVID-19 restrictions, she has had a lot of cervical spine neck pain that is better after a chiropractor visit and her 63 year old mother lives in an assisted living facility which is stressful as well.  So she has been having problems where she feels like "my mind does not get things ready for swallowing liquids" meaning that sometimes she swallows but things do not open up properly and she coughs and chokes.  Mostly with water sometimes saliva.  Also sometimes when she is chewing in and bits of food or residue will "dribble".  Other source of stress is a husband with a type I diabetic though that is not so much so she says.  There is been minimal if any weight loss reported.  Wt Readings from Last 3 Encounters:  12/30/18 130 lb (59 kg)  12/26/18 130 lb (59 kg)  12/12/18 130 lb (59 kg)   She has had similar problems in the past, not clear if the dilations have helped.  Previous endoscopy by Dr. Sharlett Iles was more for epigastric pain and reflux symptoms in 2009 and 2005 and other than a hiatal hernia those were unrevealing.  She has had a negative gastric emptying study.  She has never had a barium swallow or modified barium swallow.  She does not seem to have much trouble swallowing meats or breads etc. if any, and I do not get a history of esophageal dysphagia.  She does have a lot of heartburn usually after meals.  Over-the-counter antacids can provide benefit  and she is been taking some Pepcid at bedtime.  She does not really drink much if any caffeine.  She is not a smoker.  She has the head of her bed raised.  She uses a wedge pillow as well.  She did have a syncopal episode recently and is seeing neurology at [facility], apparently had a similar problem in 2016, negative cardiac work-up then.  She did have a CT NGO and some work-up in the ER in April when this occurred but did not show any particular reason why she would have  syncope.  It did show and I reviewed images of severe cervical spine disease with what I think are osteophytes.  She is to have an EEG and an MRI of the brain as part of her syncope work-up.  Chronic IBS constipation predominant treated with Linzess. Allergies  Allergen Reactions   Nsaids     GI discomfort and gastritis   Tolmetin Other (See Comments)    GI discomfort and gastritis    Current Outpatient Medications:    ALPRAZolam (XANAX) 0.25 MG tablet, Take 1 tablet (0.25 mg total) by mouth 2 (two) times daily as needed for anxiety (with caution of sedation)., Disp: 30 tablet, Rfl: 0   b complex vitamins tablet, Take 1 tablet by mouth daily., Disp: , Rfl:    butalbital-acetaminophen-caffeine (FIORICET) 50-325-40 MG tablet, Take 1 tablet by mouth every 6 (six) hours as needed., Disp: 90 tablet, Rfl: 0   Calcium Carb-Cholecalciferol (CALCIUM 600 + D PO), Take 1 tablet by mouth daily., Disp: , Rfl:    dexlansoprazole (DEXILANT) 60 MG capsule, Take 1 capsule (60 mg total) by mouth daily., Disp: 90 capsule, Rfl: 3   diclofenac sodium (VOLTAREN) 1 % GEL, APPLY 2 GRAMS TOPICALLY FOUR TIMES A DAY TO AFFECTED AREAS, Disp: 300 g, Rfl: 3   estradiol (ESTRACE) 0.1 MG/GM vaginal cream, Insert small amount (1 cm) in vagina every other day, Disp: 42.5 g, Rfl: 1   levothyroxine (SYNTHROID, LEVOTHROID) 88 MCG tablet, Take 1 tablet (88 mcg total) by mouth daily before breakfast., Disp: 90 tablet, Rfl: 3   linaclotide (LINZESS) 145 MCG CAPS capsule, Take 1 capsule (145 mcg total) by mouth daily., Disp: 90 capsule, Rfl: 3   mometasone (NASONEX) 50 MCG/ACT nasal spray, Place 2 sprays into the nose daily as needed., Disp: , Rfl:    Multiple Vitamin (MULTI VITAMIN PO), Multi Vitamin with Biotin, Disp: , Rfl:    pseudoephedrine (SUDAFED) 60 MG tablet, Take 60 mg by mouth daily. , Disp: , Rfl:    sucralfate (CARAFATE) 1 g tablet, Take 1 tablet (1 g total) by mouth 4 (four) times daily -  with meals  and at bedtime., Disp: 120 tablet, Rfl: 2   traMADol (ULTRAM) 50 MG tablet, Take 50 mg by mouth as needed for pain. Reported on 08/05/2015, Disp: , Rfl:   Past Medical History:  Diagnosis Date   DDD (degenerative disc disease)    chronic back pain   ETD (eustachian tube dysfunction)    GERD (gastroesophageal reflux disease)    IBS (irritable bowel syndrome)    Meniere's disease    Migraine    Osteoporosis    Thyroid disease    hypothyroid   Past Surgical History:  Procedure Laterality Date   APPENDECTOMY     ESOPHAGOGASTRODUODENOSCOPY     Multiple, last 01/07/2017 with Savary dilation to 18 mm   [place]   L5    Social History  Social History Narrative   Married, husband is a type I diabetic.  No children.   Elderly mother in her 55s lives in an assisted living facility.   Very occasional white wine with dinner, never smoker no drug use.  Limited caffeine.   family history includes Diabetes in her paternal aunt; Hypertension in her maternal grandfather and maternal grandmother.   Review of Systems As per HPI.  All other review of systems appears negative.

## 2019-01-02 ENCOUNTER — Ambulatory Visit (HOSPITAL_COMMUNITY)
Admission: RE | Admit: 2019-01-02 | Discharge: 2019-01-02 | Disposition: A | Discharge status: Home / Self Care | Payer: Medicare HMO | Source: Ambulatory Visit | Attending: Internal Medicine | Admitting: Internal Medicine

## 2019-01-02 ENCOUNTER — Other Ambulatory Visit: Payer: Self-pay

## 2019-01-02 DIAGNOSIS — R1310 Dysphagia, unspecified: Secondary | ICD-10-CM

## 2019-01-02 DIAGNOSIS — K589 Irritable bowel syndrome without diarrhea: Secondary | ICD-10-CM | POA: Insufficient documentation

## 2019-01-02 DIAGNOSIS — R1314 Dysphagia, pharyngoesophageal phase: Secondary | ICD-10-CM

## 2019-01-02 DIAGNOSIS — M810 Age-related osteoporosis without current pathological fracture: Secondary | ICD-10-CM | POA: Insufficient documentation

## 2019-01-02 DIAGNOSIS — E039 Hypothyroidism, unspecified: Secondary | ICD-10-CM | POA: Insufficient documentation

## 2019-01-02 DIAGNOSIS — R05 Cough: Secondary | ICD-10-CM | POA: Diagnosis not present

## 2019-01-02 DIAGNOSIS — K219 Gastro-esophageal reflux disease without esophagitis: Secondary | ICD-10-CM | POA: Insufficient documentation

## 2019-01-02 NOTE — Progress Notes (Signed)
Objective Swallowing Evaluation: Type of Study: MBS-Modified Barium Swallow Study   Patient Details  Name: CABRINA SHIROMA MRN: [id number] Date of Birth: [birth year]  Today's Date: 01/02/2019 Time: SLP Start Time (ACUTE ONLY): 1100 -SLP Stop Time (ACUTE ONLY): 1120  SLP Time Calculation (min) (ACUTE ONLY): 20 min   Past Medical History:  Past Medical History:  Diagnosis Date  . DDD (degenerative disc disease)    chronic back pain  . ETD (eustachian tube dysfunction)   . GERD (gastroesophageal reflux disease)   . IBS (irritable bowel syndrome)   . Meniere's disease   . Migraine   . Osteoporosis   . Thyroid disease    hypothyroid   Past Surgical History:  Past Surgical History:  Procedure Laterality Date  . APPENDECTOMY    . COLONOSCOPY     last 2012 - [facility]  . ESOPHAGOGASTRODUODENOSCOPY     Multiple, last 01/07/2017 with Savary dilation to 18 mm  . HEMORRHOID BANDING  2018   [facility]  . LUMBAR DISC SURGERY  1984   L5    HPI: The patient is a 78 year old white woman with a long history of gastroesophageal reflux disease as well as irritable bowel syndrome and hemorrhoids, who was referred by Dr. Carlean Purl due to her complaint of heartburn symptoms and swallowing difficulty. He reports "She last had an EGD and savory dilation to 18 mm for dysphagia without stricture in May 2018.  She has a known small to medium hiatal hernia.  She reports being under increased stress with the COVID-19 restrictions, she has had a lot of cervical spine neck pain that is better after a chiropractor visit and her 47 year old mother lives in an assisted living facility which is stressful as well.  So she has been having problems where she feels like "my mind does not get things ready for swallowing liquids" meaning that sometimes she swallows but things do not open up properly and she coughs and chokes.  Mostly with water sometimes saliva.  Also sometimes when she is chewing in and bits of food or residue  will "dribble"."   Dr. Carlean Purl added some new meds for GERD, but is concerned for oropharyngeal dysphagia and ordered MBS.    No data recorded   Assessment / Plan / Recommendation  CHL IP CLINICAL IMPRESSIONS 01/02/2019  Clinical Impression Pt demonstrates swallow WFL. She is noted to have small bilateral outpouching in lateral pharyngeal walls where liquids and solids accumulate during the swallow (often known at 'pharyngeal ears' and spill and pool in pyriforms post swallow which pt senses and clears spontaeously. At time a liquid wash aids in clearance with solids. Otherwise, no significant weakness or timing impairment to account for pts reports of occasionally feeling that food or liquids spill before she is ready. Overall, coughing is likely just a sign of good sensation and airway protection, though no coughing occurred during exam. She is noted to have moderate dysphonia which she mentions has gotten worse recently. Recommend f/u with ENT and OP SLP to address voice change.   SLP Visit Diagnosis Dysphagia, unspecified (R13.10)  Attention and concentration deficit following --  Frontal lobe and executive function deficit following --  Impact on safety and function Mild aspiration risk      CHL IP TREATMENT RECOMMENDATION 01/02/2019  Treatment Recommendations No treatment recommended at this time     No flowsheet data found.  CHL IP DIET RECOMMENDATION 01/02/2019  SLP Diet Recommendations Regular solids;Thin liquid  Liquid Administration via Cup;Straw  Medication  Administration Whole meds with liquid  Compensations --  Postural Changes Seated upright at 90 degrees      CHL IP OTHER RECOMMENDATIONS 01/02/2019  Recommended Consults Consider ENT evaluation  Oral Care Recommendations Oral care BID  Other Recommendations --      CHL IP FOLLOW UP RECOMMENDATIONS 01/02/2019  Follow up Recommendations (No Data)      No flowsheet data found.         CHL IP ORAL PHASE 01/02/2019   Oral Phase WFL  Oral - Pudding Teaspoon --  Oral - Pudding Cup --  Oral - Honey Teaspoon --  Oral - Honey Cup --  Oral - Nectar Teaspoon --  Oral - Nectar Cup --  Oral - Nectar Straw --  Oral - Thin Teaspoon --  Oral - Thin Cup --  Oral - Thin Straw --  Oral - Puree --  Oral - Mech Soft --  Oral - Regular --  Oral - Multi-Consistency --  Oral - Pill --  Oral Phase - Comment --    CHL IP PHARYNGEAL PHASE 01/02/2019  Pharyngeal Phase WFL  Pharyngeal- Pudding Teaspoon --  Pharyngeal --  Pharyngeal- Pudding Cup --  Pharyngeal --  Pharyngeal- Honey Teaspoon --  Pharyngeal --  Pharyngeal- Honey Cup --  Pharyngeal --  Pharyngeal- Nectar Teaspoon --  Pharyngeal --  Pharyngeal- Nectar Cup --  Pharyngeal --  Pharyngeal- Nectar Straw --  Pharyngeal --  Pharyngeal- Thin Teaspoon --  Pharyngeal --  Pharyngeal- Thin Cup --  Pharyngeal --  Pharyngeal- Thin Straw --  Pharyngeal --  Pharyngeal- Puree --  Pharyngeal --  Pharyngeal- Mechanical Soft --  Pharyngeal --  Pharyngeal- Regular --  Pharyngeal --  Pharyngeal- Multi-consistency --  Pharyngeal --  Pharyngeal- Pill --  Pharyngeal --  Pharyngeal Comment --     CHL IP CERVICAL ESOPHAGEAL PHASE 01/02/2019  Cervical Esophageal Phase WFL  Pudding Teaspoon --  Pudding Cup --  Honey Teaspoon --  Honey Cup --  Nectar Teaspoon --  Nectar Cup --  Nectar Straw --  Thin Teaspoon --  Thin Cup --  Thin Straw --  Puree --  Mechanical Soft --  Regular --  Multi-consistency --  Pill --  Cervical Esophageal Comment --    Herbie Baltimore, MA CCC-SLP  Acute Rehabilitation Services Pager [phone] Office [phone]  Lynann Beaver 01/02/2019, 1:07 PM

## 2019-01-07 ENCOUNTER — Other Ambulatory Visit: Payer: Self-pay | Admitting: Neurology

## 2019-01-07 DIAGNOSIS — R4020 Unspecified coma: Secondary | ICD-10-CM

## 2019-01-07 NOTE — Progress Notes (Signed)
[name],  This study showed some minor abnormalities which are probably related to at least some of your symptoms.  Please give me an update as to how you are doing so we may chart the next steps.  Sarah Mayer, MD, [facility]

## 2019-01-14 DIAGNOSIS — G4733 Obstructive sleep apnea (adult) (pediatric): Secondary | ICD-10-CM | POA: Diagnosis not present

## 2019-01-15 DIAGNOSIS — R4020 Unspecified coma: Secondary | ICD-10-CM | POA: Diagnosis not present

## 2019-01-15 DIAGNOSIS — R569 Unspecified convulsions: Secondary | ICD-10-CM | POA: Diagnosis not present

## 2019-01-17 ENCOUNTER — Ambulatory Visit
Admission: RE | Admit: 2019-01-17 | Discharge: 2019-01-17 | Disposition: A | Discharge status: Home / Self Care | Payer: Medicare HMO | Source: Ambulatory Visit | Attending: Neurology | Admitting: Neurology

## 2019-01-17 ENCOUNTER — Other Ambulatory Visit: Payer: Self-pay

## 2019-01-17 DIAGNOSIS — R4020 Unspecified coma: Secondary | ICD-10-CM | POA: Diagnosis not present

## 2019-01-17 DIAGNOSIS — R55 Syncope and collapse: Secondary | ICD-10-CM | POA: Diagnosis not present

## 2019-01-17 MED ORDER — GADOBUTROL 1 MMOL/ML IV SOLN
5.0000 mL | Freq: Once | INTRAVENOUS | Status: AC | PRN
  Administered 2019-01-17: 5 mL via INTRAVENOUS

## 2019-01-27 ENCOUNTER — Encounter: Payer: Self-pay | Admitting: Family Medicine

## 2019-01-27 DIAGNOSIS — G4733 Obstructive sleep apnea (adult) (pediatric): Secondary | ICD-10-CM | POA: Diagnosis not present

## 2019-01-27 DIAGNOSIS — H6123 Impacted cerumen, bilateral: Secondary | ICD-10-CM | POA: Diagnosis not present

## 2019-01-27 DIAGNOSIS — R498 Other voice and resonance disorders: Secondary | ICD-10-CM | POA: Diagnosis not present

## 2019-01-27 MED ORDER — ALPRAZOLAM 0.25 MG PO TABS: 0.2500 mg | ORAL_TABLET | Freq: Two times a day (BID) | ORAL | 0 refills | Status: DC | PRN

## 2019-01-29 ENCOUNTER — Ambulatory Visit: Payer: Medicare HMO | Admitting: Podiatry

## 2019-01-29 ENCOUNTER — Encounter: Payer: Self-pay | Admitting: Podiatry

## 2019-01-29 ENCOUNTER — Other Ambulatory Visit: Payer: Self-pay

## 2019-01-29 VITALS — Temp 97.6°F

## 2019-01-29 DIAGNOSIS — M722 Plantar fascial fibromatosis: Secondary | ICD-10-CM | POA: Diagnosis not present

## 2019-01-29 NOTE — Progress Notes (Signed)
She presents today complaining of bilateral heel pain.  Objective: Pulses are palpable she has pain on palpation medial calcaneal tubercles bilateral.  Assessment: Plantar fasciitis.  Plan: Injected bilateral heels today.  20 mg Kenalog 5 mg of Marcaine.

## 2019-02-07 ENCOUNTER — Other Ambulatory Visit: Payer: Medicare Other

## 2019-02-11 ENCOUNTER — Encounter: Payer: Medicare Other | Admitting: Family Medicine

## 2019-02-17 ENCOUNTER — Encounter: Payer: Medicare Other | Admitting: Family Medicine

## 2019-03-02 ENCOUNTER — Telehealth: Payer: Self-pay | Admitting: Family Medicine

## 2019-03-02 DIAGNOSIS — E7800 Pure hypercholesterolemia, unspecified: Secondary | ICD-10-CM

## 2019-03-02 DIAGNOSIS — E039 Hypothyroidism, unspecified: Secondary | ICD-10-CM

## 2019-03-02 DIAGNOSIS — Z0000 Encounter for general adult medical examination without abnormal findings: Secondary | ICD-10-CM

## 2019-03-02 NOTE — Telephone Encounter (Signed)
-----   Message from Ellamae Sia sent at 02/26/2019  2:13 PM EDT ----- Regarding: Lab orders for Monday, 7.13.20 Patient is scheduled for CPX labs, please order future labs, Thanks , Karna Christmas

## 2019-03-03 ENCOUNTER — Other Ambulatory Visit (INDEPENDENT_AMBULATORY_CARE_PROVIDER_SITE_OTHER): Payer: Medicare HMO

## 2019-03-03 ENCOUNTER — Other Ambulatory Visit: Payer: Self-pay

## 2019-03-03 DIAGNOSIS — E039 Hypothyroidism, unspecified: Secondary | ICD-10-CM | POA: Diagnosis not present

## 2019-03-03 DIAGNOSIS — E7800 Pure hypercholesterolemia, unspecified: Secondary | ICD-10-CM | POA: Diagnosis not present

## 2019-03-03 DIAGNOSIS — Z0000 Encounter for general adult medical examination without abnormal findings: Secondary | ICD-10-CM

## 2019-03-03 LAB — CBC WITH DIFFERENTIAL/PLATELET
Basophils Absolute: 0.1 10*3/uL (ref 0.0–0.1)
Basophils Relative: 0.9 % (ref 0.0–3.0)
Eosinophils Absolute: 0.1 10*3/uL (ref 0.0–0.7)
Eosinophils Relative: 2.5 % (ref 0.0–5.0)
HCT: 41.8 % (ref 36.0–46.0)
Hemoglobin: 13.7 g/dL (ref 12.0–15.0)
Lymphocytes Relative: 25 % (ref 12.0–46.0)
Lymphs Abs: 1.5 10*3/uL (ref 0.7–4.0)
MCHC: 32.8 g/dL (ref 30.0–36.0)
MCV: 90.6 fl (ref 78.0–100.0)
Monocytes Absolute: 0.5 10*3/uL (ref 0.1–1.0)
Monocytes Relative: 9.1 % (ref 3.0–12.0)
Neutro Abs: 3.7 10*3/uL (ref 1.4–7.7)
Neutrophils Relative %: 62.5 % (ref 43.0–77.0)
Platelets: 282 10*3/uL (ref 150.0–400.0)
RBC: 4.61 Mil/uL (ref 3.87–5.11)
RDW: 14.7 % (ref 11.5–15.5)
WBC: 6 10*3/uL (ref 4.0–10.5)

## 2019-03-03 LAB — COMPREHENSIVE METABOLIC PANEL
ALT: 16 U/L (ref 0–35)
AST: 21 U/L (ref 0–37)
Albumin: 4.6 g/dL (ref 3.5–5.2)
Alkaline Phosphatase: 79 U/L (ref 39–117)
BUN: 10 mg/dL (ref 6–23)
CO2: 30 mEq/L (ref 19–32)
Calcium: 9.6 mg/dL (ref 8.4–10.5)
Chloride: 96 mEq/L (ref 96–112)
Creatinine, Ser: 0.67 mg/dL (ref 0.40–1.20)
GFR: 85.18 mL/min (ref >60.00)
Glucose, Bld: 90 mg/dL (ref 70–99)
Potassium: 4.1 mEq/L (ref 3.5–5.1)
Sodium: 133 mEq/L — ABNORMAL LOW (ref 135–145)
Total Bilirubin: 0.6 mg/dL (ref 0.2–1.2)
Total Protein: 7 g/dL (ref 6.0–8.3)

## 2019-03-03 LAB — LIPID PANEL
Cholesterol: 199 mg/dL (ref 0–200)
HDL: 91.6 mg/dL (ref >39.00)
LDL Cholesterol: 96 mg/dL (ref 0–99)
NonHDL: 107.76
Total CHOL/HDL Ratio: 2
Triglycerides: 61 mg/dL (ref 0.0–149.0)
VLDL: 12.2 mg/dL (ref 0.0–40.0)

## 2019-03-03 LAB — TSH: TSH: 3.46 u[IU]/mL (ref 0.35–4.50)

## 2019-03-07 ENCOUNTER — Other Ambulatory Visit: Payer: Self-pay

## 2019-03-07 ENCOUNTER — Ambulatory Visit (INDEPENDENT_AMBULATORY_CARE_PROVIDER_SITE_OTHER): Payer: Medicare HMO | Admitting: Family Medicine

## 2019-03-07 ENCOUNTER — Encounter: Payer: Self-pay | Admitting: Family Medicine

## 2019-03-07 VITALS — BP 136/78 | HR 72 | Temp 97.7°F | Ht 65.0 in | Wt 130.5 lb

## 2019-03-07 DIAGNOSIS — Z0000 Encounter for general adult medical examination without abnormal findings: Secondary | ICD-10-CM

## 2019-03-07 DIAGNOSIS — F430 Acute stress reaction: Secondary | ICD-10-CM

## 2019-03-07 DIAGNOSIS — R69 Illness, unspecified: Secondary | ICD-10-CM | POA: Diagnosis not present

## 2019-03-07 DIAGNOSIS — M8589 Other specified disorders of bone density and structure, multiple sites: Secondary | ICD-10-CM | POA: Diagnosis not present

## 2019-03-07 DIAGNOSIS — Z1211 Encounter for screening for malignant neoplasm of colon: Secondary | ICD-10-CM | POA: Diagnosis not present

## 2019-03-07 DIAGNOSIS — E7800 Pure hypercholesterolemia, unspecified: Secondary | ICD-10-CM

## 2019-03-07 DIAGNOSIS — E039 Hypothyroidism, unspecified: Secondary | ICD-10-CM | POA: Diagnosis not present

## 2019-03-07 MED ORDER — LINACLOTIDE 145 MCG PO CAPS: 145.0000 ug | ORAL_CAPSULE | Freq: Every day | ORAL | 3 refills | Status: DC

## 2019-03-07 MED ORDER — LEVOTHYROXINE SODIUM 88 MCG PO TABS: 88.0000 ug | ORAL_TABLET | Freq: Every day | ORAL | 3 refills | Status: DC

## 2019-03-07 MED ORDER — DEXILANT 60 MG PO CPDR: 60.0000 mg | DELAYED_RELEASE_CAPSULE | Freq: Every day | ORAL | 3 refills | Status: DC

## 2019-03-07 NOTE — Progress Notes (Signed)
Subjective:    Patient ID: Sarah Harper, female    DOB: May 27, 1941, 78 y.o.   MRN: [id number]  HPI Here for health maintenance exam and to review chronic medical problems    A lot of frustration with aging  POA for her mother  Helping her sister  She is also over stressed (moved her 49 yo mother from asst living to sister's house) Has lost some neighbors/moving   Had appt with Dr Manuella Ghazi after her syncopal episode  MRI was ok / age related change  EEG was normal- he wants to do a longer one   Cognitive changes- some slow down in short term memory - overall ok  No confusion  Doing some brain exercises   For exercise -walks on level areas (no hills due to back problems)  Also exercises  Sees chiropractor also    Wt Readings from Last 3 Encounters:  03/07/19 130 lb 8 oz (59.2 kg)  12/30/18 130 lb (59 kg)  12/26/18 130 lb (59 kg)  mt wt well  Appetite is fine  occ sweets  21.72 kg/m   Declines amw   Mammogram 6/19 -wants to hold off due to pandemiic  Self breast exam -no lumps or changes   Flu shot 9/19 Tetanus shot Td 5/13 PNA vaccines complete zostavax 2014  dexa 12/17- putting off during pandemic  Osteopenia  Falls--none recent  Fractures- none  Supplements-ca and D  Exercise - doing balance training and walking   She is taking biotin for thinning hair  Stress is adding to it  She thinks it helped when she took it    Colonoscopy 4/09 cologuard neg 6/19 -up to date    Hearing Screening   125Hz  250Hz  500Hz  1000Hz  2000Hz  3000Hz  4000Hz  6000Hz  8000Hz   Right ear:           Left ear:           Vision Screening Comments: Pt had an eye exam yearly with Dr. Jeni Salles at [facility].    Hypothyroidism  Pt has no clinical changes No change in energy level/ hair or skin/ edema and no tremor Lab Results  Component Value Date   TSH 3.46 03/03/2019      Hyperlipidemia Lab Results  Component Value Date   CHOL 199 03/03/2019   CHOL 198  01/31/2018   CHOL 193 01/29/2017   Lab Results  Component Value Date   HDL 91.60 03/03/2019   HDL 83.60 01/31/2018   HDL 79.50 01/29/2017   Lab Results  Component Value Date   LDLCALC 96 03/03/2019   LDLCALC 98 01/31/2018   LDLCALC 100 (H) 01/29/2017   Lab Results  Component Value Date   TRIG 61.0 03/03/2019   TRIG 84.0 01/31/2018   TRIG 64.0 01/29/2017   Lab Results  Component Value Date   CHOLHDL 2 03/03/2019   CHOLHDL 2 01/31/2018   CHOLHDL 2 01/29/2017   Lab Results  Component Value Date   LDLDIRECT 115.5 02/12/2013   LDLDIRECT 109.9 12/19/2010   LDLDIRECT 117.7 12/17/2009   Very good profile  Between diet and genetics   BP Readings from Last 3 Encounters:  03/07/19 136/78  12/12/18 122/82  12/11/18 (!) 118/98   Pulse Readings from Last 3 Encounters:  03/07/19 72  12/12/18 80  12/11/18 84   Lab Results  Component Value Date   CREATININE 0.67 03/03/2019   BUN 10 03/03/2019   NA 133 (L) 03/03/2019   K 4.1 03/03/2019   CL  96 03/03/2019   CO2 30 03/03/2019   Lab Results  Component Value Date   ALT 16 03/03/2019   AST 21 03/03/2019   ALKPHOS 79 03/03/2019   BILITOT 0.6 03/03/2019   Lab Results  Component Value Date   WBC 6.0 03/03/2019   HGB 13.7 03/03/2019   HCT 41.8 03/03/2019   MCV 90.6 03/03/2019   PLT 282.0 03/03/2019   Patient Active Problem List   Diagnosis Date Noted   Syncope and collapse 12/12/2018   Colon cancer screening 02/05/2018   Degenerative joint disease (DJD) of lumbar spine 05/02/2017   Internal hemorrhoids 05/02/2017   Generalized osteoarthritis of hand 12/11/2016   Stress reaction 12/11/2016   Estrogen deficiency 05/11/2016   Encounter for screening mammogram for breast cancer 05/11/2016   OSA (obstructive sleep apnea) 03/06/2016   Fatigue 01/25/2016   Right ankle swelling 10/18/2015   Left knee pain 10/18/2015   Snoring 10/18/2015   Routine general medical examination at a health care facility  02/28/2015   Encounter for Medicare annual wellness exam 02/06/2013   Hyperlipidemia 08/27/2012   Irregular heart beat 08/05/2012   Gynecological examination 12/20/2010   BARRETTS ESOPHAGUS 10/01/2010   Osteopenia 12/17/2009   BACK PAIN, LUMBAR 07/02/2009   IRRITABLE BOWEL SYNDROME 04/02/2009   POSTMENOPAUSAL STATUS 12/10/2008   Hypothyroidism 12/05/2007   Meniere's disease 12/05/2007   GERD 12/05/2007   Past Medical History:  Diagnosis Date   DDD (degenerative disc disease)    chronic back pain   ETD (eustachian tube dysfunction)    GERD (gastroesophageal reflux disease)    IBS (irritable bowel syndrome)    Meniere's disease    Migraine    Osteoporosis    Thyroid disease    hypothyroid   Past Surgical History:  Procedure Laterality Date   APPENDECTOMY     COLONOSCOPY     last 2012 - [facility]   ESOPHAGOGASTRODUODENOSCOPY     Multiple, last 01/07/2017 with Savary dilation to 18 mm   HEMORRHOID BANDING  2018   [facility]   LUMBAR DISC SURGERY  1984   L5    Social History   Tobacco Use   Smoking status: Never Smoker   Smokeless tobacco: Never Used  Substance Use Topics   Alcohol use: Yes    Alcohol/week: 1.0 standard drinks    Types: 1 Glasses of wine per week    Comment: wine occasional   Drug use: No   Family History  Problem Relation Age of Onset   Diabetes Paternal Aunt    Hypertension Maternal Grandmother    Hypertension Maternal Grandfather    Allergies  Allergen Reactions   Nsaids     GI discomfort and gastritis   Tolmetin Other (See Comments)    GI discomfort and gastritis   Current Outpatient Medications on File Prior to Visit  Medication Sig Dispense Refill   ALPRAZolam (XANAX) 0.25 MG tablet Take 1 tablet (0.25 mg total) by mouth 2 (two) times daily as needed for anxiety (with caution of sedation). 30 tablet 0   b complex vitamins tablet Take 1 tablet by mouth daily.     butalbital-acetaminophen-caffeine  (FIORICET) 50-325-40 MG tablet Take 1 tablet by mouth every 6 (six) hours as needed. 90 tablet 0   Calcium Carb-Cholecalciferol (CALCIUM 600 + D PO) Take 1 tablet by mouth daily.     diclofenac sodium (VOLTAREN) 1 % GEL APPLY 2 GRAMS TOPICALLY FOUR TIMES A DAY TO AFFECTED AREAS 300 g 3   estradiol (ESTRACE) 0.1 MG/GM  vaginal cream Insert small amount (1 cm) in vagina every other day 42.5 g 1   mometasone (NASONEX) 50 MCG/ACT nasal spray Place 2 sprays into the nose daily as needed.     Multiple Vitamin (MULTI VITAMIN PO) Multi Vitamin with Biotin     pseudoephedrine (SUDAFED) 60 MG tablet Take 60 mg by mouth daily.      sucralfate (CARAFATE) 1 g tablet Take 1 tablet (1 g total) by mouth 4 (four) times daily -  with meals and at bedtime. 120 tablet 2   traMADol (ULTRAM) 50 MG tablet Take 50 mg by mouth as needed for pain. Reported on 08/05/2015     No current facility-administered medications on file prior to visit.     Review of Systems  Constitutional: Positive for fatigue. Negative for activity change, appetite change, fever and unexpected weight change.  HENT: Negative for congestion, ear pain, rhinorrhea, sinus pressure and sore throat.   Eyes: Negative for pain, redness and visual disturbance.  Respiratory: Negative for cough, shortness of breath and wheezing.   Cardiovascular: Negative for chest pain and palpitations.  Gastrointestinal: Negative for abdominal pain, blood in stool, constipation and diarrhea.  Endocrine: Negative for polydipsia and polyuria.  Genitourinary: Negative for dysuria, frequency and urgency.  Musculoskeletal: Positive for arthralgias and back pain. Negative for myalgias.  Skin: Negative for pallor and rash.  Allergic/Immunologic: Negative for environmental allergies.  Neurological: Negative for dizziness, syncope and headaches.  Hematological: Negative for adenopathy. Does not bruise/bleed easily.  Psychiatric/Behavioral: Negative for decreased  concentration and dysphoric mood. The patient is nervous/anxious.        Caregiver stress 71 yo mother        Objective:   Physical Exam Constitutional:      General: She is not in acute distress.    Appearance: Normal appearance. She is well-developed and normal weight. She is not ill-appearing.     Comments: slim  HENT:     Head: Normocephalic and atraumatic.     Right Ear: Tympanic membrane, ear canal and external ear normal.     Left Ear: Tympanic membrane, ear canal and external ear normal.     Mouth/Throat:     Mouth: Mucous membranes are moist.     Pharynx: Oropharynx is clear. No posterior oropharyngeal erythema.  Eyes:     General: No scleral icterus.    Conjunctiva/sclera: Conjunctivae normal.     Pupils: Pupils are equal, round, and reactive to light.  Neck:     Musculoskeletal: Normal range of motion and neck supple.     Thyroid: No thyromegaly.     Vascular: No carotid bruit or JVD.  Cardiovascular:     Rate and Rhythm: Normal rate and regular rhythm.     Pulses: Normal pulses.     Heart sounds: Normal heart sounds. No gallop.   Pulmonary:     Effort: Pulmonary effort is normal. No respiratory distress.     Breath sounds: Normal breath sounds. No wheezing or rales.     Comments: Good air exch Chest:     Chest wall: No tenderness.  Abdominal:     General: Bowel sounds are normal. There is no distension or abdominal bruit.     Palpations: Abdomen is soft. There is no mass.     Tenderness: There is no abdominal tenderness. There is no right CVA tenderness or left CVA tenderness.  Genitourinary:    Comments: Breast exam: No mass, nodules, thickening, tenderness, bulging, retraction, inflamation, nipple discharge or  skin changes noted.  No axillary or clavicular LA.     Musculoskeletal: Normal range of motion.        General: No tenderness.     Right lower leg: No edema.     Left lower leg: No edema.  Lymphadenopathy:     Cervical: No cervical adenopathy.    Skin:    General: Skin is warm and dry.     Coloration: Skin is not pale.     Findings: No erythema or rash.     Comments: Solar lentigines diffusely Some sks and solar aging   Neurological:     Mental Status: She is alert.     Cranial Nerves: No cranial nerve deficit.     Motor: No abnormal muscle tone.     Coordination: Coordination normal.     Gait: Gait normal.     Deep Tendon Reflexes: Reflexes are normal and symmetric. Reflexes normal.  Psychiatric:        Mood and Affect: Mood normal.     Comments: Discusses stressors candidly            Assessment & Plan:   Problem List Items Addressed This Visit      Endocrine   Hypothyroidism   Relevant Medications   levothyroxine (SYNTHROID) 88 MCG tablet     Musculoskeletal and Integument   Osteopenia    dexa 12/17-declines one now due to pandemic No recent falls or fx Taking ca and D Good exercise         Other   Hyperlipidemia    Disc goals for lipids and reasons to control them Rev last labs with pt Rev low sat fat diet in detail  Very well controlled with diet       Routine general medical examination at a health care facility - Primary    Reviewed health habits including diet and exercise and skin cancer prevention Reviewed appropriate screening tests for age  Also reviewed health mt list, fam hx and immunization status , as well as social and family history   See HPI Pt declines mammogram and dexa until the pandemic ends or slows down Disc imp of self care Socialization (with soc distancing) for brain health and memory       Stress reaction    Caring for 55 yo mother /caregiver stress Reviewed stressors/ coping techniques/symptoms/ support sources/ tx options and side effects in detail today Declines counseling Enc to get help when she can Takes xanax prn -disc risk of habit and sedation       Colon cancer screening    utd cologuard neg 6/19

## 2019-03-07 NOTE — Patient Instructions (Signed)
Keep taking care of yourself  Socialize safely - with technology and outdoors   Stay active Labs look good   Let us know in the future if you need a referral for mammogram or bone density test

## 2019-03-07 NOTE — Assessment & Plan Note (Addendum)
Caring for 78 yo mother /caregiver stress Reviewed stressors/ coping techniques/symptoms/ support sources/ tx options and side effects in detail today Declines counseling Enc to get help when she can Takes xanax prn -disc risk of habit and sedation

## 2019-03-09 NOTE — Assessment & Plan Note (Signed)
Disc goals for lipids and reasons to control them Rev last labs with pt Rev low sat fat diet in detail  Very well controlled with diet

## 2019-03-09 NOTE — Assessment & Plan Note (Signed)
Reviewed health habits including diet and exercise and skin cancer prevention Reviewed appropriate screening tests for age  Also reviewed health mt list, fam hx and immunization status , as well as social and family history   See HPI Pt declines mammogram and dexa until the pandemic ends or slows down Disc imp of self care Socialization (with soc distancing) for brain health and memory

## 2019-03-09 NOTE — Assessment & Plan Note (Signed)
utd cologuard neg 6/19

## 2019-03-09 NOTE — Assessment & Plan Note (Signed)
dexa 12/17-declines one now due to pandemic No recent falls or fx Taking ca and D Good exercise

## 2019-04-09 DIAGNOSIS — R4020 Unspecified coma: Secondary | ICD-10-CM | POA: Diagnosis not present

## 2019-04-09 DIAGNOSIS — R569 Unspecified convulsions: Secondary | ICD-10-CM | POA: Diagnosis not present

## 2019-04-09 DIAGNOSIS — G43119 Migraine with aura, intractable, without status migrainosus: Secondary | ICD-10-CM | POA: Diagnosis not present

## 2019-04-14 DIAGNOSIS — G4733 Obstructive sleep apnea (adult) (pediatric): Secondary | ICD-10-CM | POA: Diagnosis not present

## 2019-04-21 ENCOUNTER — Encounter: Payer: Self-pay | Admitting: Family Medicine

## 2019-04-21 ENCOUNTER — Other Ambulatory Visit: Payer: Self-pay

## 2019-04-21 ENCOUNTER — Encounter: Payer: Self-pay | Admitting: Podiatry

## 2019-04-21 ENCOUNTER — Ambulatory Visit: Payer: Medicare HMO | Admitting: Podiatry

## 2019-04-21 DIAGNOSIS — M722 Plantar fascial fibromatosis: Secondary | ICD-10-CM

## 2019-04-21 NOTE — Progress Notes (Signed)
She presents today states that the cortisone injection that was put in my foot does not seem to be helping either foot at all.  The right one seems to be the worst.  Cortisone injections only have ever helped my knee they did not even help my back.  She states that she has severe degenerative disc disease of her back and between her back and her foot she is exquisitely pain a lot.  Objective: Vital signs are stable alert and oriented x3.  Pulses are palpable.  Neurologic sensorium is intact.  Deep tendon reflexes are intact.  Muscle strength is normal and symmetrical bilateral.  She has pain on palpation of the plantar fasciitis insertion site right greater than left with a palpable void in the plantar fascia at its insertion site.  Either this is a tear or just a simple chronic plantar fasciitis.  Assessment: Probable plantar fascial tear of the right foot rule out chronic proximal plantar fasciitis or possible back associated pain.  Plan: Discussed etiology pathology conservative surgical therapies requesting MRI at this point for surgical consideration of the right foot.

## 2019-04-22 ENCOUNTER — Ambulatory Visit: Payer: Medicare HMO

## 2019-05-01 ENCOUNTER — Ambulatory Visit: Payer: Medicare HMO

## 2019-05-06 DIAGNOSIS — H2513 Age-related nuclear cataract, bilateral: Secondary | ICD-10-CM | POA: Diagnosis not present

## 2019-05-07 ENCOUNTER — Ambulatory Visit (INDEPENDENT_AMBULATORY_CARE_PROVIDER_SITE_OTHER): Payer: Medicare HMO

## 2019-05-07 DIAGNOSIS — Z23 Encounter for immunization: Secondary | ICD-10-CM | POA: Diagnosis not present

## 2019-05-09 ENCOUNTER — Other Ambulatory Visit: Payer: Self-pay

## 2019-05-09 ENCOUNTER — Ambulatory Visit (INDEPENDENT_AMBULATORY_CARE_PROVIDER_SITE_OTHER): Payer: Medicare HMO | Admitting: Family Medicine

## 2019-05-09 ENCOUNTER — Encounter: Payer: Self-pay | Admitting: Family Medicine

## 2019-05-09 VITALS — BP 138/68 | HR 79 | Temp 97.2°F | Ht 65.0 in | Wt 130.4 lb

## 2019-05-09 DIAGNOSIS — R339 Retention of urine, unspecified: Secondary | ICD-10-CM | POA: Diagnosis not present

## 2019-05-09 DIAGNOSIS — H2511 Age-related nuclear cataract, right eye: Secondary | ICD-10-CM | POA: Diagnosis not present

## 2019-05-09 DIAGNOSIS — G4733 Obstructive sleep apnea (adult) (pediatric): Secondary | ICD-10-CM | POA: Diagnosis not present

## 2019-05-09 DIAGNOSIS — H52202 Unspecified astigmatism, left eye: Secondary | ICD-10-CM | POA: Diagnosis not present

## 2019-05-09 LAB — POC URINALSYSI DIPSTICK (AUTOMATED)
Bilirubin, UA: NEGATIVE
Blood, UA: NEGATIVE
Glucose, UA: NEGATIVE
Ketones, UA: NEGATIVE
Leukocytes, UA: NEGATIVE
Nitrite, UA: NEGATIVE
Protein, UA: NEGATIVE
Spec Grav, UA: 1.025 (ref 1.010–1.025)
Urobilinogen, UA: 0.2 E.U./dL
pH, UA: 6 (ref 5.0–8.0)

## 2019-05-09 MED ORDER — ESTRADIOL 0.1 MG/GM VA CREA: TOPICAL_CREAM | VAGINAL | 3 refills | Status: DC

## 2019-05-09 NOTE — Progress Notes (Signed)
Subjective:    Patient ID: Sarah Harper, female    DOB: [birth year], 78 y.o.   MRN: [id number]  HPI Here for urinary symptoms   Wt Readings from Last 3 Encounters:  05/09/19 130 lb 6 oz (59.1 kg)  03/07/19 130 lb 8 oz (59.2 kg)  12/30/18 130 lb (59 kg)   21.70 kg/m  2 weeks  A little acidic smell on and off  No blood in urine   Has urgency  occ incontinence when she coughs or sneezes  Hard to begin urination - get flow going  No dyuria  Not often sexually active  No pelvic pain    Has had a partial hysterectomy -unsure what it was for   No allergy medicines besides nasonex   Uses estrogen vaginal cream three times per week- small amount   Results for orders placed or performed in visit on 05/09/19  POCT Urinalysis Dipstick (Automated)  Result Value Ref Range   Color, UA Yellow    Clarity, UA Clear    Glucose, UA Negative Negative   Bilirubin, UA Negative    Ketones, UA Negative    Spec Grav, UA 1.025 1.010 - 1.025   Blood, UA Negative    pH, UA 6.0 5.0 - 8.0   Protein, UA Negative Negative   Urobilinogen, UA 0.2 0.2 or 1.0 E.U./dL   Nitrite, UA Negative    Leukocytes, UA Negative Negative   does drink water  Very little carbonated drinks   Patient Active Problem List   Diagnosis Date Noted  . Urinary retention 05/09/2019  . Syncope and collapse 12/12/2018  . Colon cancer screening 02/05/2018  . Degenerative joint disease (DJD) of lumbar spine 05/02/2017  . Internal hemorrhoids 05/02/2017  . Generalized osteoarthritis of hand 12/11/2016  . Stress reaction 12/11/2016  . Estrogen deficiency 05/11/2016  . Encounter for screening mammogram for breast cancer 05/11/2016  . OSA (obstructive sleep apnea) 03/06/2016  . Fatigue 01/25/2016  . Right ankle swelling 10/18/2015  . Left knee pain 10/18/2015  . Snoring 10/18/2015  . Routine general medical examination at a health care facility 02/28/2015  . Encounter for Medicare annual wellness exam 02/06/2013   . Hyperlipidemia 08/27/2012  . Irregular heart beat 08/05/2012  . Gynecological examination 12/20/2010  . BARRETTS ESOPHAGUS 10/01/2010  . Osteopenia 12/17/2009  . BACK PAIN, LUMBAR 07/02/2009  . IRRITABLE BOWEL SYNDROME 04/02/2009  . POSTMENOPAUSAL STATUS 12/10/2008  . Hypothyroidism 12/05/2007  . Meniere's disease 12/05/2007  . GERD 12/05/2007   Past Medical History:  Diagnosis Date  . DDD (degenerative disc disease)    chronic back pain  . ETD (eustachian tube dysfunction)   . GERD (gastroesophageal reflux disease)   . IBS (irritable bowel syndrome)   . Meniere's disease   . Migraine   . Osteoporosis   . Thyroid disease    hypothyroid   Past Surgical History:  Procedure Laterality Date  . APPENDECTOMY    . COLONOSCOPY     last 2012 - [facility]  . ESOPHAGOGASTRODUODENOSCOPY     Multiple, last 01/07/2017 with Savary dilation to 18 mm  . HEMORRHOID BANDING  2018   [facility]  . LUMBAR DISC SURGERY  1984   L5    Social History   Tobacco Use  . Smoking status: Never Smoker  . Smokeless tobacco: Never Used  Substance Use Topics  . Alcohol use: Yes    Alcohol/week: 1.0 standard drinks    Types: 1 Glasses of wine per week  Comment: wine occasional  . Drug use: No   Family History  Problem Relation Age of Onset  . Diabetes Paternal Aunt   . Hypertension Maternal Grandmother   . Hypertension Maternal Grandfather    Allergies  Allergen Reactions  . Nsaids     GI discomfort and gastritis  . Tolmetin Other (See Comments)    GI discomfort and gastritis   Current Outpatient Medications on File Prior to Visit  Medication Sig Dispense Refill  . ALPRAZolam (XANAX) 0.25 MG tablet Take 1 tablet (0.25 mg total) by mouth 2 (two) times daily as needed for anxiety (with caution of sedation). 30 tablet 0  . b complex vitamins tablet Take 1 tablet by mouth daily.    . butalbital-acetaminophen-caffeine (FIORICET) 50-325-40 MG tablet Take 1 tablet by mouth every 6 (six) hours as  needed. 90 tablet 0  . Calcium Carb-Cholecalciferol (CALCIUM 600 + D PO) Take 1 tablet by mouth daily.    Marland Kitchen dexlansoprazole (DEXILANT) 60 MG capsule Take 1 capsule (60 mg total) by mouth daily. 90 capsule 3  . diclofenac sodium (VOLTAREN) 1 % GEL APPLY 2 GRAMS TOPICALLY FOUR TIMES A DAY TO AFFECTED AREAS 300 g 3  . levothyroxine (SYNTHROID) 88 MCG tablet Take 1 tablet (88 mcg total) by mouth daily before breakfast. 90 tablet 3  . linaclotide (LINZESS) 145 MCG CAPS capsule Take 1 capsule (145 mcg total) by mouth daily. 90 capsule 3  . mometasone (NASONEX) 50 MCG/ACT nasal spray Place 2 sprays into the nose daily as needed.    . Multiple Vitamin (MULTI VITAMIN PO) Multi Vitamin with Biotin    . sucralfate (CARAFATE) 1 g tablet Take 1 tablet (1 g total) by mouth 4 (four) times daily -  with meals and at bedtime. 120 tablet 2   No current facility-administered medications on file prior to visit.      Review of Systems  Constitutional: Negative for activity change, appetite change, fatigue, fever and unexpected weight change.  HENT: Negative for congestion, ear pain, rhinorrhea, sinus pressure and sore throat.   Eyes: Negative for pain, redness and visual disturbance.  Respiratory: Negative for cough, shortness of breath and wheezing.   Cardiovascular: Negative for chest pain and palpitations.  Gastrointestinal: Negative for abdominal pain, blood in stool, constipation and diarrhea.  Endocrine: Negative for polydipsia and polyuria.  Genitourinary: Positive for difficulty urinating, frequency and urgency. Negative for dysuria.  Musculoskeletal: Negative for arthralgias, back pain and myalgias.  Skin: Negative for pallor and rash.  Allergic/Immunologic: Negative for environmental allergies.  Neurological: Negative for dizziness, syncope and headaches.  Hematological: Negative for adenopathy. Does not bruise/bleed easily.  Psychiatric/Behavioral: Negative for decreased concentration and dysphoric  mood. The patient is not nervous/anxious.        Objective:   Physical Exam Constitutional:      General: She is not in acute distress.    Appearance: Normal appearance. She is normal weight. She is not ill-appearing.  HENT:     Head: Normocephalic and atraumatic.     Mouth/Throat:     Mouth: Mucous membranes are moist.  Eyes:     General: No scleral icterus.    Conjunctiva/sclera: Conjunctivae normal.     Pupils: Pupils are equal, round, and reactive to light.  Neck:     Musculoskeletal: Normal range of motion. No muscular tenderness.  Cardiovascular:     Rate and Rhythm: Normal rate and regular rhythm.     Heart sounds: Normal heart sounds.  Pulmonary:  Effort: Pulmonary effort is normal. No respiratory distress.     Breath sounds: Normal breath sounds. No wheezing or rales.  Abdominal:     General: Abdomen is flat. Bowel sounds are normal. There is no distension.     Palpations: Abdomen is soft. There is no mass.     Tenderness: There is no abdominal tenderness.     Hernia: No hernia is present.     Comments: No suprapubic tenderness or fullness    Genitourinary:    General: Normal vulva.     Comments: Nl external genitalia  Mild mucosal atrophy  Nl appearing urethra  Small cystocele -minimal  Pelvic strength is fairly good  Lymphadenopathy:     Cervical: No cervical adenopathy.  Skin:    General: Skin is warm and dry.     Findings: No erythema or rash.  Neurological:     Mental Status: She is alert.  Psychiatric:        Mood and Affect: Mood normal.           Assessment & Plan:   Problem List Items Addressed This Visit      Genitourinary   Urinary retention - Primary    Pt feels like she is having difficulty emptying bladder No infection  Exam reassuring  Suspect this may be related to post menopausal changes/mild cystocele and atrophy She already uses estrace cream vaginally 3 d per week  inst to use daily to urethra area for 2 weeks and see if  this makes a difference in ability to empty  She will update [place] at this time-then will develop mt plan If no imp will need to see urology      Relevant Orders   POCT Urinalysis Dipstick (Automated) (Completed)

## 2019-05-09 NOTE — Patient Instructions (Addendum)
Continue the vaginal cream internally three times per week   For 2 weeks also use a small amount around the urethra area every day  Drink water  Avoid bladder irritants- acids/caffeine/ soda/ artificial sweeteners   Keep urine dilute- drink water   Let me know in about 2 weeks how you are doing so we can plan further   Take care of yourself

## 2019-05-11 NOTE — Assessment & Plan Note (Signed)
Pt feels like she is having difficulty emptying bladder No infection  Exam reassuring  Suspect this may be related to post menopausal changes/mild cystocele and atrophy She already uses estrace cream vaginally 3 d per week  inst to use daily to urethra area for 2 weeks and see if this makes a difference in ability to empty  She will update [place] at this time-then will develop mt plan If no imp will need to see urology

## 2019-05-12 ENCOUNTER — Telehealth: Payer: Self-pay

## 2019-05-12 DIAGNOSIS — M722 Plantar fascial fibromatosis: Secondary | ICD-10-CM

## 2019-05-12 NOTE — Telephone Encounter (Signed)
-----   Message from Rip Harbour, [facility] sent at 04/21/2019  1:53 PM EDT ----- Regarding: MRI MRI right heel - evaluate for plantar fascial tear right - surgical consideration

## 2019-05-14 ENCOUNTER — Other Ambulatory Visit: Payer: Self-pay

## 2019-05-14 ENCOUNTER — Encounter: Payer: Self-pay | Admitting: *Deleted

## 2019-05-16 ENCOUNTER — Other Ambulatory Visit
Admission: RE | Admit: 2019-05-16 | Discharge: 2019-05-16 | Disposition: A | Discharge status: Home / Self Care | Payer: Medicare HMO | Source: Ambulatory Visit | Attending: Ophthalmology | Admitting: Ophthalmology

## 2019-05-16 DIAGNOSIS — Z01812 Encounter for preprocedural laboratory examination: Secondary | ICD-10-CM | POA: Diagnosis not present

## 2019-05-16 DIAGNOSIS — Z20828 Contact with and (suspected) exposure to other viral communicable diseases: Secondary | ICD-10-CM | POA: Diagnosis not present

## 2019-05-17 LAB — SARS CORONAVIRUS 2 (TAT 6-24 HRS): SARS Coronavirus 2: NEGATIVE

## 2019-05-19 NOTE — Discharge Instructions (Signed)

## 2019-05-21 ENCOUNTER — Ambulatory Visit: Payer: Medicare HMO | Admitting: Anesthesiology

## 2019-05-21 ENCOUNTER — Other Ambulatory Visit: Payer: Self-pay

## 2019-05-21 ENCOUNTER — Encounter
Admission: RE | Disposition: A | Discharge status: Home / Self Care | Payer: Self-pay | Source: Home / Self Care | Attending: Ophthalmology

## 2019-05-21 ENCOUNTER — Ambulatory Visit
Admission: RE | Admit: 2019-05-21 | Discharge: 2019-05-21 | Disposition: A | Discharge status: Home / Self Care | Payer: Medicare HMO | Attending: Ophthalmology | Admitting: Ophthalmology

## 2019-05-21 DIAGNOSIS — K219 Gastro-esophageal reflux disease without esophagitis: Secondary | ICD-10-CM | POA: Insufficient documentation

## 2019-05-21 DIAGNOSIS — Z79899 Other long term (current) drug therapy: Secondary | ICD-10-CM | POA: Insufficient documentation

## 2019-05-21 DIAGNOSIS — G4730 Sleep apnea, unspecified: Secondary | ICD-10-CM | POA: Diagnosis not present

## 2019-05-21 DIAGNOSIS — Z79890 Hormone replacement therapy (postmenopausal): Secondary | ICD-10-CM | POA: Insufficient documentation

## 2019-05-21 DIAGNOSIS — E039 Hypothyroidism, unspecified: Secondary | ICD-10-CM | POA: Insufficient documentation

## 2019-05-21 DIAGNOSIS — H2511 Age-related nuclear cataract, right eye: Secondary | ICD-10-CM | POA: Diagnosis not present

## 2019-05-21 DIAGNOSIS — H25811 Combined forms of age-related cataract, right eye: Secondary | ICD-10-CM | POA: Diagnosis not present

## 2019-05-21 HISTORY — PX: CATARACT EXTRACTION W/PHACO: SHX586

## 2019-05-21 HISTORY — DX: Sleep apnea, unspecified: G47.30

## 2019-05-21 HISTORY — DX: Syncope and collapse: R55

## 2019-05-21 SURGERY — PHACOEMULSIFICATION, CATARACT, WITH IOL INSERTION
Anesthesia: Monitor Anesthesia Care | Site: Eye | Laterality: Right

## 2019-05-21 MED ORDER — NA HYALUR & NA CHOND-NA HYALUR 0.4-0.35 ML IO KIT
PACK | INTRAOCULAR | Status: DC | PRN
  Administered 2019-05-21: 1 mL via INTRAOCULAR

## 2019-05-21 MED ORDER — EPINEPHRINE PF 1 MG/ML IJ SOLN
INTRAOCULAR | Status: DC | PRN
  Administered 2019-05-21: 52 mL via OPHTHALMIC

## 2019-05-21 MED ORDER — MOXIFLOXACIN HCL 0.5 % OP SOLN
1.0000 [drp] | OPHTHALMIC | Status: DC | PRN
  Administered 2019-05-21 (×3): 1 [drp] via OPHTHALMIC

## 2019-05-21 MED ORDER — ARMC OPHTHALMIC DILATING DROPS
1.0000 "application " | OPHTHALMIC | Status: DC | PRN
  Administered 2019-05-21 (×3): 1 via OPHTHALMIC

## 2019-05-21 MED ORDER — TETRACAINE HCL 0.5 % OP SOLN
1.0000 [drp] | OPHTHALMIC | Status: DC | PRN
  Administered 2019-05-21 (×3): 1 [drp] via OPHTHALMIC

## 2019-05-21 MED ORDER — FENTANYL CITRATE (PF) 100 MCG/2ML IJ SOLN
INTRAMUSCULAR | Status: DC | PRN
  Administered 2019-05-21: 50 ug via INTRAVENOUS

## 2019-05-21 MED ORDER — CEFUROXIME OPHTHALMIC INJECTION 1 MG/0.1 ML
INJECTION | OPHTHALMIC | Status: DC | PRN
  Administered 2019-05-21: 0.1 mL via INTRACAMERAL

## 2019-05-21 MED ORDER — LACTATED RINGERS IV SOLN: INTRAVENOUS | Status: DC

## 2019-05-21 MED ORDER — MIDAZOLAM HCL 2 MG/2ML IJ SOLN
INTRAMUSCULAR | Status: DC | PRN
  Administered 2019-05-21: 1 mg via INTRAVENOUS

## 2019-05-21 MED ORDER — LIDOCAINE HCL (PF) 2 % IJ SOLN
INTRAOCULAR | Status: DC | PRN
  Administered 2019-05-21: 1 mL

## 2019-05-21 MED ORDER — BRIMONIDINE TARTRATE-TIMOLOL 0.2-0.5 % OP SOLN
OPHTHALMIC | Status: DC | PRN
  Administered 2019-05-21: 1 [drp] via OPHTHALMIC

## 2019-05-21 SURGICAL SUPPLY — 17 items
CANNULA ANT/CHMB 27G (MISCELLANEOUS) ×1 IMPLANT
CANNULA ANT/CHMB 27GA (MISCELLANEOUS) ×2 IMPLANT
GLOVE SURG LX 7.5 STRW (GLOVE) ×1
GLOVE SURG LX STRL 7.5 STRW (GLOVE) ×1 IMPLANT
GLOVE SURG TRIUMPH 8.0 PF LTX (GLOVE) ×2 IMPLANT
GOWN STRL REUS W/ TWL LRG LVL3 (GOWN DISPOSABLE) ×2 IMPLANT
GOWN STRL REUS W/TWL LRG LVL3 (GOWN DISPOSABLE) ×4
LENS IOL ACRSF IQ PAN 24.0 IMPLANT
LENS IOL IQ PANOPTIX 24.0 ×2 IMPLANT
MARKER SKIN DUAL TIP RULER LAB (MISCELLANEOUS) ×2 IMPLANT
PACK CATARACT BRASINGTON (MISCELLANEOUS) ×2 IMPLANT
PACK EYE AFTER SURG (MISCELLANEOUS) ×2 IMPLANT
PACK OPTHALMIC (MISCELLANEOUS) ×2 IMPLANT
SYR 3ML LL SCALE MARK (SYRINGE) ×2 IMPLANT
SYR TB 1ML LUER SLIP (SYRINGE) ×2 IMPLANT
WATER STERILE IRR 500ML POUR (IV SOLUTION) ×2 IMPLANT
WIPE NON LINTING 3.25X3.25 (MISCELLANEOUS) ×2 IMPLANT

## 2019-05-21 NOTE — Anesthesia Postprocedure Evaluation (Signed)
Anesthesia Post Note  Patient: Sarah Harper  Procedure(s) Performed: CATARACT EXTRACTION PHACO AND INTRAOCULAR LENS PLACEMENT (IOC) RIGHT panoptix lens  00:35.0  21.7%  7.61 (Right Eye)  Patient location during evaluation: PACU Anesthesia Type: MAC Level of consciousness: awake and alert and oriented Pain management: satisfactory to patient Vital Signs Assessment: post-procedure vital signs reviewed and stable Respiratory status: spontaneous breathing, nonlabored ventilation and respiratory function stable Cardiovascular status: blood pressure returned to baseline and stable Postop Assessment: Adequate PO intake and No signs of nausea or vomiting Anesthetic complications: no    Raliegh Ip

## 2019-05-21 NOTE — Op Note (Signed)
LOCATION:  [place]   PREOPERATIVE DIAGNOSIS:    Nuclear sclerotic cataract right eye. H25.11   POSTOPERATIVE DIAGNOSIS:  Nuclear sclerotic cataract right eye.     PROCEDURE:  Phacoemusification with posterior chamber intraocular lens placement of the right eye   ULTRASOUND TIME: Procedure(s) with comments: CATARACT EXTRACTION PHACO AND INTRAOCULAR LENS PLACEMENT (IOC) RIGHT panoptix lens  00:35.0  21.7%  7.61 (Right) - sleep apnea requests early  LENS:   Implant Name Type Inv. Item Serial No. Manufacturer Lot No. LRB No. Used Action  ACRYSOF IQ PANOPTIX IOL Intraocular Lens  [id number]   Right 1 Implanted      [id number] 24.0 PanOptix IOL   SURGEON:  Wyonia Hough, MD   ANESTHESIA:  Topical with tetracaine drops and 2% Xylocaine jelly, augmented with 1% preservative-free intracameral lidocaine.    COMPLICATIONS:  None.   DESCRIPTION OF PROCEDURE:  The patient was identified in the holding room and transported to the operating room and placed in the supine position under the operating microscope.  The right eye was identified as the operative eye and it was prepped and draped in the usual sterile ophthalmic fashion.   A 1 millimeter clear-corneal paracentesis was made at the 12:00 position.  0.5 ml of preservative-free 1% lidocaine was injected into the anterior chamber. The anterior chamber was filled with Viscoat viscoelastic.  A 2.4 millimeter keratome was used to make a near-clear corneal incision at the 9:00 position.  A curvilinear capsulorrhexis was made with a cystotome and capsulorrhexis forceps.  Balanced salt solution was used to hydrodissect and hydrodelineate the nucleus.   Phacoemulsification was then used in stop and chop fashion to remove the lens nucleus and epinucleus.  The remaining cortex was then removed using the irrigation and aspiration handpiece. Provisc was then placed into the capsular bag to distend it for lens placement.  A lens was then  injected into the capsular bag.  The remaining viscoelastic was aspirated.   Wounds were hydrated with balanced salt solution.  The anterior chamber was inflated to a physiologic pressure with balanced salt solution.  No wound leaks were noted. Cefuroxime 0.1 ml of a 10mg /ml solution was injected into the anterior chamber for a dose of 1 mg of intracameral antibiotic at the completion of the case.   Timolol and Brimonidine drops were applied to the eye.  The patient was taken to the recovery room in stable condition without complications of anesthesia or surgery.   Sarah Harper 05/21/2019, 9:04 AM

## 2019-05-21 NOTE — H&P (Signed)

## 2019-05-21 NOTE — Anesthesia Preprocedure Evaluation (Signed)
Anesthesia Evaluation  Patient identified by MRN, date of birth, ID band Patient awake    Reviewed: Allergy & Precautions, H&P , NPO status , Patient's Chart, lab work & pertinent test results  Airway Mallampati: II  TM Distance: >3 FB Neck ROM: full    Dental no notable dental hx.    Pulmonary sleep apnea ,    Pulmonary exam normal breath sounds clear to auscultation       Cardiovascular Normal cardiovascular exam Rhythm:regular Rate:Normal     Neuro/Psych  Headaches, PSYCHIATRIC DISORDERS    GI/Hepatic GERD  ,  Endo/Other  Hypothyroidism   Renal/GU      Musculoskeletal   Abdominal   Peds  Hematology   Anesthesia Other Findings   Reproductive/Obstetrics                             Anesthesia Physical Anesthesia Plan  ASA: II  Anesthesia Plan: MAC   Post-op Pain Management:    Induction:   PONV Risk Score and Plan: 2 and Midazolam, TIVA and Treatment may vary due to age or medical condition  Airway Management Planned:   Additional Equipment:   Intra-op Plan:   Post-operative Plan:   Informed Consent: I have reviewed the patients History and Physical, chart, labs and discussed the procedure including the risks, benefits and alternatives for the proposed anesthesia with the patient or authorized representative who has indicated his/her understanding and acceptance.       Plan Discussed with: CRNA  Anesthesia Plan Comments:         Anesthesia Quick Evaluation

## 2019-05-21 NOTE — Transfer of Care (Signed)
Immediate Anesthesia Transfer of Care Note  Patient: Sarah Harper  Procedure(s) Performed: CATARACT EXTRACTION PHACO AND INTRAOCULAR LENS PLACEMENT (IOC) RIGHT panoptix lens  00:35.0  21.7%  7.61 (Right Eye)  Patient Location: PACU  Anesthesia Type: MAC  Level of Consciousness: awake, alert  and patient cooperative  Airway and Oxygen Therapy: Patient Spontanous Breathing and Patient connected to supplemental oxygen  Post-op Assessment: Post-op Vital signs reviewed, Patient's Cardiovascular Status Stable, Respiratory Function Stable, Patent Airway and No signs of Nausea or vomiting  Post-op Vital Signs: Reviewed and stable  Complications: No apparent anesthesia complications

## 2019-05-26 ENCOUNTER — Encounter: Payer: Self-pay | Admitting: Family Medicine

## 2019-06-02 ENCOUNTER — Other Ambulatory Visit: Payer: Self-pay

## 2019-06-02 ENCOUNTER — Ambulatory Visit (INDEPENDENT_AMBULATORY_CARE_PROVIDER_SITE_OTHER): Payer: Medicare HMO | Admitting: Family Medicine

## 2019-06-02 ENCOUNTER — Encounter: Payer: Self-pay | Admitting: Family Medicine

## 2019-06-02 VITALS — BP 126/74 | HR 61 | Temp 97.5°F | Ht 65.0 in | Wt 129.0 lb

## 2019-06-02 DIAGNOSIS — E039 Hypothyroidism, unspecified: Secondary | ICD-10-CM | POA: Diagnosis not present

## 2019-06-02 DIAGNOSIS — W19XXXA Unspecified fall, initial encounter: Secondary | ICD-10-CM | POA: Insufficient documentation

## 2019-06-02 DIAGNOSIS — H2512 Age-related nuclear cataract, left eye: Secondary | ICD-10-CM | POA: Diagnosis not present

## 2019-06-02 DIAGNOSIS — Y92009 Unspecified place in unspecified non-institutional (private) residence as the place of occurrence of the external cause: Secondary | ICD-10-CM | POA: Insufficient documentation

## 2019-06-02 DIAGNOSIS — S80211A Abrasion, right knee, initial encounter: Secondary | ICD-10-CM | POA: Insufficient documentation

## 2019-06-02 NOTE — Progress Notes (Signed)
Subjective:    Patient ID: Sarah Harper, female    DOB: [birth year], 78 y.o.   MRN: [id number]  HPI Pt presents for injuries after a fall   Fell 10 days ago shortly after cataract surgery on 9/30  [facility] face forward  Injured R knee and left hand  Hit teeth but no injury (loosened 5 front teeth-did go to dentist the next day) Was still too groggy from anesthetic and tripped over a rock in [place] scab came off her R knee  It was painful last night  Also has a scab on palm of her R hand under thumb  Able to move everything ok    Last Td was 5/13   Wt Readings from Last 3 Encounters:  06/02/19 129 lb (58.5 kg)  05/21/19 130 lb (59 kg)  05/09/19 130 lb 6 oz (59.1 kg)   21.47 kg/m   Patient Active Problem List   Diagnosis Date Noted  . Fall 06/02/2019  . Abrasion of right knee 06/02/2019  . Urinary retention 05/09/2019  . Syncope and collapse 12/12/2018  . Colon cancer screening 02/05/2018  . Degenerative joint disease (DJD) of lumbar spine 05/02/2017  . Internal hemorrhoids 05/02/2017  . Generalized osteoarthritis of hand 12/11/2016  . Stress reaction 12/11/2016  . Estrogen deficiency 05/11/2016  . Encounter for screening mammogram for breast cancer 05/11/2016  . OSA (obstructive sleep apnea) 03/06/2016  . Fatigue 01/25/2016  . Right ankle swelling 10/18/2015  . Left knee pain 10/18/2015  . Snoring 10/18/2015  . Routine general medical examination at a health care facility 02/28/2015  . Encounter for Medicare annual wellness exam 02/06/2013  . Hyperlipidemia 08/27/2012  . Irregular heart beat 08/05/2012  . Gynecological examination 12/20/2010  . BARRETTS ESOPHAGUS 10/01/2010  . Osteopenia 12/17/2009  . BACK PAIN, LUMBAR 07/02/2009  . IRRITABLE BOWEL SYNDROME 04/02/2009  . POSTMENOPAUSAL STATUS 12/10/2008  . Hypothyroidism 12/05/2007  . Meniere's disease 12/05/2007  . GERD 12/05/2007   Past Medical History:  Diagnosis Date  . DDD  (degenerative disc disease)    chronic back pain  . ETD (eustachian tube dysfunction)   . GERD (gastroesophageal reflux disease)   . IBS (irritable bowel syndrome)   . Meniere's disease   . Migraine    still gets visual aura from time to time  . Osteoporosis   . Sleep apnea    CPAP  . Syncopal episodes    after work-up - possible seizures?  . Thyroid disease    hypothyroid   Past Surgical History:  Procedure Laterality Date  . APPENDECTOMY    . CATARACT EXTRACTION W/PHACO Right 05/21/2019   Procedure: CATARACT EXTRACTION PHACO AND INTRAOCULAR LENS PLACEMENT ([place]) RIGHT panoptix lens  00:35.0  21.7%  7.61;  Surgeon: Leandrew Koyanagi, MD;  Location: [facility];  Service: Ophthalmology;  Laterality: Right;  sleep apnea requests early  . COLONOSCOPY     last 2012 - [facility]  . ESOPHAGOGASTRODUODENOSCOPY     Multiple, last 01/07/2017 with Savary dilation to 18 mm  . HEMORRHOID BANDING  2018   [facility]  . LUMBAR DISC SURGERY  1984   L5    Social History   Tobacco Use  . Smoking status: Never Smoker  . Smokeless tobacco: Never Used  Substance Use Topics  . Alcohol use: Yes    Alcohol/week: 1.0 standard drinks    Types: 1 Glasses of wine per week    Comment: wine occasional  . Drug use: No   Family  History  Problem Relation Age of Onset  . Diabetes Paternal Aunt   . Hypertension Maternal Grandmother   . Hypertension Maternal Grandfather    Allergies  Allergen Reactions  . Nsaids     GI discomfort and gastritis  . Tolmetin Other (See Comments)    GI discomfort and gastritis   Current Outpatient Medications on File Prior to Visit  Medication Sig Dispense Refill  . ALPRAZolam (XANAX) 0.25 MG tablet Take 1 tablet (0.25 mg total) by mouth 2 (two) times daily as needed for anxiety (with caution of sedation). 30 tablet 0  . b complex vitamins tablet Take 1 tablet by mouth daily.    . butalbital-acetaminophen-caffeine (FIORICET) 50-325-40 MG tablet Take 1 tablet by  mouth every 6 (six) hours as needed. 90 tablet 0  . Calcium Carb-Cholecalciferol (CALCIUM 600 + D PO) Take 1 tablet by mouth daily.    Marland Kitchen dexlansoprazole (DEXILANT) 60 MG capsule Take 1 capsule (60 mg total) by mouth daily. 90 capsule 3  . diclofenac sodium (VOLTAREN) 1 % GEL APPLY 2 GRAMS TOPICALLY FOUR TIMES A DAY TO AFFECTED AREAS 300 g 3  . estradiol (ESTRACE) 0.1 MG/GM vaginal cream Insert small amount (1 cm) in vagina every other day 42.5 g 3  . levothyroxine (SYNTHROID) 88 MCG tablet Take 1 tablet (88 mcg total) by mouth daily before breakfast. 90 tablet 3  . linaclotide (LINZESS) 145 MCG CAPS capsule Take 1 capsule (145 mcg total) by mouth daily. 90 capsule 3  . mometasone (NASONEX) 50 MCG/ACT nasal spray Place 2 sprays into the nose daily as needed.    . Multiple Vitamin (MULTI VITAMIN PO) Multi Vitamin with Biotin    . pseudoephedrine (SUDAFED) 60 MG tablet Take 60 mg by mouth every 4 (four) hours as needed for congestion.    . sucralfate (CARAFATE) 1 g tablet Take 1 tablet (1 g total) by mouth 4 (four) times daily -  with meals and at bedtime. 120 tablet 2   No current facility-administered medications on file prior to visit.     Review of Systems  Constitutional: Negative for activity change, appetite change, fatigue, fever and unexpected weight change.  HENT: Negative for congestion, ear pain, rhinorrhea, sinus pressure and sore throat.   Eyes: Positive for visual disturbance. Negative for pain and redness.  Respiratory: Negative for cough, shortness of breath and wheezing.   Cardiovascular: Negative for chest pain and palpitations.  Gastrointestinal: Negative for abdominal pain, blood in stool, constipation and diarrhea.  Endocrine: Negative for polydipsia and polyuria.  Genitourinary: Negative for dysuria, frequency and urgency.  Musculoskeletal: Positive for arthralgias. Negative for back pain and myalgias.  Skin: Positive for wound. Negative for pallor and rash.   Allergic/Immunologic: Negative for environmental allergies.  Neurological: Negative for dizziness, syncope and headaches.  Hematological: Negative for adenopathy. Does not bruise/bleed easily.  Psychiatric/Behavioral: Negative for decreased concentration and dysphoric mood. The patient is not nervous/anxious.        Objective:   Physical Exam Constitutional:      General: She is not in acute distress.    Appearance: Normal appearance. She is normal weight. She is not ill-appearing.  HENT:     Head: Normocephalic and atraumatic.  Eyes:     General:        Right eye: No discharge.        Left eye: No discharge.     Extraocular Movements: Extraocular movements intact.     Conjunctiva/sclera: Conjunctivae normal.     Pupils:  Pupils are equal, round, and reactive to light.  Neck:     Musculoskeletal: Normal range of motion. No neck rigidity.  Cardiovascular:     Rate and Rhythm: Normal rate and regular rhythm.     Heart sounds: Normal heart sounds. No murmur.  Pulmonary:     Effort: Pulmonary effort is normal. No respiratory distress.     Breath sounds: Normal breath sounds. No wheezing or rales.  Musculoskeletal:     Right lower leg: No edema.     Left lower leg: No edema.     Comments: Contusion on R knee with wound /abrasion  minimal ecchymosis  No patellar tenderness No fluctuance or crepitus Nl rom   Lymphadenopathy:     Cervical: No cervical adenopathy.  Skin:    Comments: Abrasion on R knee- 3 by 3 cm with pale healing granulation tissue that appears wet Small collar of nl appearing erythema No swelling or streaking  Small healed scab on L palm-thenar area  Neurological:     Mental Status: She is alert.     Cranial Nerves: No cranial nerve deficit.     Motor: No weakness.     Coordination: Coordination normal.     Deep Tendon Reflexes: Reflexes normal.  Psychiatric:        Mood and Affect: Mood normal.           Assessment & Plan:   Problem List Items  Addressed This Visit      Musculoskeletal and Integument   Abrasion of right knee - Primary    More pain after scab came off in shower  Wound does not look infected Enc pt to clean with soap and water/ lightly cover with bandage and abx oint (dressed today)  This will be uncomfortable until healed due to being on a flexor surface of joint Disc s/s of infection to watch for         Other   Fall    Fall forward-likely caused from grogginess after cataract surgery  Pt agrees to be more careful in the future Abrasions on R knee and L hand appear to be healing  Td is utd  Continue wound care  Disc fall prev in detail

## 2019-06-02 NOTE — Patient Instructions (Signed)
Your abrasions do not look infected   Use soap/water to clean   Use antibiotic ointment over the counter on knee  Loose bandage until healed enough to be dry  Do not submerge   Update if not starting to improve in a week or if worsening

## 2019-06-02 NOTE — Assessment & Plan Note (Signed)
More pain after scab came off in shower  Wound does not look infected Enc pt to clean with soap and water/ lightly cover with bandage and abx oint (dressed today)  This will be uncomfortable until healed due to being on a flexor surface of joint Disc s/s of infection to watch for

## 2019-06-02 NOTE — Assessment & Plan Note (Signed)
Fall forward-likely caused from grogginess after cataract surgery  Pt agrees to be more careful in the future Abrasions on R knee and L hand appear to be healing  Td is utd  Continue wound care  Disc fall prev in detail

## 2019-06-04 ENCOUNTER — Encounter: Payer: Self-pay | Admitting: *Deleted

## 2019-06-04 ENCOUNTER — Other Ambulatory Visit: Payer: Self-pay

## 2019-06-05 NOTE — Anesthesia Preprocedure Evaluation (Addendum)
Anesthesia Evaluation  Patient identified by MRN, date of birth, ID band Patient awake    Reviewed: Allergy & Precautions, NPO status , Patient's Chart, lab work & pertinent test results  History of Anesthesia Complications Negative for: history of anesthetic complications  Airway Mallampati: II  TM Distance: >3 FB Neck ROM: Limited    Dental  (+)    Pulmonary sleep apnea ,    breath sounds clear to auscultation       Cardiovascular (-) angina(-) DOE  Rhythm:Regular Rate:Normal     Neuro/Psych  Headaches, Seizures - (Undergoing workup for seizure disorder after multiple episodes of syncope),  PSYCHIATRIC DISORDERS Anxiety  Meniere's disease    GI/Hepatic GERD  Controlled,IBS   Endo/Other  Hypothyroidism   Renal/GU      Musculoskeletal  (+) Arthritis ,   Abdominal   Peds  Hematology   Anesthesia Other Findings   Reproductive/Obstetrics                           Anesthesia Physical Anesthesia Plan  ASA: III  Anesthesia Plan: MAC   Post-op Pain Management:    Induction: Intravenous  PONV Risk Score and Plan: 2 and TIVA and Midazolam  Airway Management Planned: Nasal Cannula  Additional Equipment:   Intra-op Plan:   Post-operative Plan:   Informed Consent: I have reviewed the patients History and Physical, chart, labs and discussed the procedure including the risks, benefits and alternatives for the proposed anesthesia with the patient or authorized representative who has indicated his/her understanding and acceptance.       Plan Discussed with: CRNA and Anesthesiologist  Anesthesia Plan Comments:         Anesthesia Quick Evaluation

## 2019-06-06 ENCOUNTER — Other Ambulatory Visit
Admission: RE | Admit: 2019-06-06 | Discharge: 2019-06-06 | Disposition: A | Discharge status: Home / Self Care | Payer: Medicare HMO | Source: Ambulatory Visit | Attending: Ophthalmology | Admitting: Ophthalmology

## 2019-06-06 ENCOUNTER — Ambulatory Visit: Payer: Medicare HMO

## 2019-06-06 ENCOUNTER — Other Ambulatory Visit: Payer: Self-pay

## 2019-06-06 DIAGNOSIS — Z20828 Contact with and (suspected) exposure to other viral communicable diseases: Secondary | ICD-10-CM | POA: Diagnosis not present

## 2019-06-06 DIAGNOSIS — Z01812 Encounter for preprocedural laboratory examination: Secondary | ICD-10-CM | POA: Diagnosis not present

## 2019-06-06 LAB — SARS CORONAVIRUS 2 (TAT 6-24 HRS): SARS Coronavirus 2: NEGATIVE

## 2019-06-09 ENCOUNTER — Ambulatory Visit: Payer: Medicare HMO

## 2019-06-09 NOTE — Discharge Instructions (Signed)

## 2019-06-11 ENCOUNTER — Ambulatory Visit: Payer: Medicare HMO | Admitting: Anesthesiology

## 2019-06-11 ENCOUNTER — Ambulatory Visit
Admission: RE | Admit: 2019-06-11 | Discharge: 2019-06-11 | Disposition: A | Discharge status: Home / Self Care | Payer: Medicare HMO | Attending: Ophthalmology | Admitting: Ophthalmology

## 2019-06-11 ENCOUNTER — Encounter
Admission: RE | Disposition: A | Discharge status: Home / Self Care | Payer: Self-pay | Source: Home / Self Care | Attending: Ophthalmology

## 2019-06-11 ENCOUNTER — Other Ambulatory Visit: Payer: Self-pay

## 2019-06-11 DIAGNOSIS — K219 Gastro-esophageal reflux disease without esophagitis: Secondary | ICD-10-CM | POA: Diagnosis not present

## 2019-06-11 DIAGNOSIS — F419 Anxiety disorder, unspecified: Secondary | ICD-10-CM | POA: Insufficient documentation

## 2019-06-11 DIAGNOSIS — M1990 Unspecified osteoarthritis, unspecified site: Secondary | ICD-10-CM | POA: Diagnosis not present

## 2019-06-11 DIAGNOSIS — Z881 Allergy status to other antibiotic agents status: Secondary | ICD-10-CM | POA: Diagnosis not present

## 2019-06-11 DIAGNOSIS — Z79899 Other long term (current) drug therapy: Secondary | ICD-10-CM | POA: Diagnosis not present

## 2019-06-11 DIAGNOSIS — Z79890 Hormone replacement therapy (postmenopausal): Secondary | ICD-10-CM | POA: Diagnosis not present

## 2019-06-11 DIAGNOSIS — K589 Irritable bowel syndrome without diarrhea: Secondary | ICD-10-CM | POA: Insufficient documentation

## 2019-06-11 DIAGNOSIS — G4730 Sleep apnea, unspecified: Secondary | ICD-10-CM | POA: Insufficient documentation

## 2019-06-11 DIAGNOSIS — H2512 Age-related nuclear cataract, left eye: Secondary | ICD-10-CM | POA: Insufficient documentation

## 2019-06-11 DIAGNOSIS — R69 Illness, unspecified: Secondary | ICD-10-CM | POA: Diagnosis not present

## 2019-06-11 DIAGNOSIS — H25812 Combined forms of age-related cataract, left eye: Secondary | ICD-10-CM | POA: Diagnosis not present

## 2019-06-11 DIAGNOSIS — E039 Hypothyroidism, unspecified: Secondary | ICD-10-CM | POA: Diagnosis not present

## 2019-06-11 HISTORY — PX: CATARACT EXTRACTION W/PHACO: SHX586

## 2019-06-11 SURGERY — PHACOEMULSIFICATION, CATARACT, WITH IOL INSERTION
Anesthesia: Monitor Anesthesia Care | Site: Eye | Laterality: Left

## 2019-06-11 MED ORDER — FENTANYL CITRATE (PF) 100 MCG/2ML IJ SOLN
INTRAMUSCULAR | Status: DC | PRN
  Administered 2019-06-11 (×2): 50 ug via INTRAVENOUS

## 2019-06-11 MED ORDER — LIDOCAINE HCL (PF) 2 % IJ SOLN
INTRAOCULAR | Status: DC | PRN
  Administered 2019-06-11: 1 mL

## 2019-06-11 MED ORDER — NA HYALUR & NA CHOND-NA HYALUR 0.4-0.35 ML IO KIT
PACK | INTRAOCULAR | Status: DC | PRN
  Administered 2019-06-11: 1 mL via INTRAOCULAR

## 2019-06-11 MED ORDER — BRIMONIDINE TARTRATE-TIMOLOL 0.2-0.5 % OP SOLN
OPHTHALMIC | Status: DC | PRN
  Administered 2019-06-11: 1 [drp] via OPHTHALMIC

## 2019-06-11 MED ORDER — MOXIFLOXACIN HCL 0.5 % OP SOLN
1.0000 [drp] | OPHTHALMIC | Status: DC | PRN
  Administered 2019-06-11 (×3): 1 [drp] via OPHTHALMIC

## 2019-06-11 MED ORDER — ARMC OPHTHALMIC DILATING DROPS
1.0000 "application " | OPHTHALMIC | Status: DC | PRN
  Administered 2019-06-11 (×3): 1 via OPHTHALMIC

## 2019-06-11 MED ORDER — MIDAZOLAM HCL 2 MG/2ML IJ SOLN
INTRAMUSCULAR | Status: DC | PRN
  Administered 2019-06-11 (×2): 1 mg via INTRAVENOUS

## 2019-06-11 MED ORDER — TETRACAINE HCL 0.5 % OP SOLN
1.0000 [drp] | OPHTHALMIC | Status: DC | PRN
  Administered 2019-06-11 (×3): 1 [drp] via OPHTHALMIC

## 2019-06-11 MED ORDER — CEFUROXIME OPHTHALMIC INJECTION 1 MG/0.1 ML
INJECTION | OPHTHALMIC | Status: DC | PRN
  Administered 2019-06-11: 0.1 mL via INTRACAMERAL

## 2019-06-11 MED ORDER — LACTATED RINGERS IV SOLN: 100.0000 mL/h | INTRAVENOUS | Status: DC

## 2019-06-11 SURGICAL SUPPLY — 18 items
CANNULA ANT/CHMB 27G (MISCELLANEOUS) ×1 IMPLANT
CANNULA ANT/CHMB 27GA (MISCELLANEOUS) ×2 IMPLANT
GLOVE SURG LX 7.5 STRW (GLOVE) ×1
GLOVE SURG LX STRL 7.5 STRW (GLOVE) ×1 IMPLANT
GLOVE SURG TRIUMPH 8.0 PF LTX (GLOVE) ×2 IMPLANT
GOWN STRL REUS W/ TWL LRG LVL3 (GOWN DISPOSABLE) ×2 IMPLANT
GOWN STRL REUS W/TWL LRG LVL3 (GOWN DISPOSABLE) ×4
LENS IOL IQ PAN TRC 30 24.0 IMPLANT
LENS IOL PANOP TORIC 30 24.0 ×1 IMPLANT
LENS IOL PANOPTIX TORIC 24.0 ×2 IMPLANT
MARKER SKIN DUAL TIP RULER LAB (MISCELLANEOUS) ×2 IMPLANT
PACK CATARACT BRASINGTON (MISCELLANEOUS) ×2 IMPLANT
PACK EYE AFTER SURG (MISCELLANEOUS) ×2 IMPLANT
PACK OPTHALMIC (MISCELLANEOUS) ×2 IMPLANT
SYR 3ML LL SCALE MARK (SYRINGE) ×2 IMPLANT
SYR TB 1ML LUER SLIP (SYRINGE) ×2 IMPLANT
WATER STERILE IRR 500ML POUR (IV SOLUTION) ×2 IMPLANT
WIPE NON LINTING 3.25X3.25 (MISCELLANEOUS) ×2 IMPLANT

## 2019-06-11 NOTE — Anesthesia Postprocedure Evaluation (Signed)
Anesthesia Post Note  Patient: Sarah Harper  Procedure(s) Performed: CATARACT EXTRACTION PHACO AND INTRAOCULAR LENS PLACEMENT (IOC) LEFT PANOPTIX TORIC LENS  00:50.0  20.3%  10.21 (Left Eye)  Patient location during evaluation: PACU Anesthesia Type: MAC Level of consciousness: awake and alert Pain management: pain level controlled Vital Signs Assessment: post-procedure vital signs reviewed and stable Respiratory status: spontaneous breathing, nonlabored ventilation, respiratory function stable and patient connected to nasal cannula oxygen Cardiovascular status: stable and blood pressure returned to baseline Postop Assessment: no apparent nausea or vomiting Anesthetic complications: no    Cionna Collantes A  Rosealee Recinos

## 2019-06-11 NOTE — H&P (Signed)

## 2019-06-11 NOTE — Op Note (Signed)
LOCATION:  [place]   PREOPERATIVE DIAGNOSIS:  Nuclear sclerotic cataract of the left eye.  H25.12  POSTOPERATIVE DIAGNOSIS:  Nuclear sclerotic cataract of the left eye.   PROCEDURE:  Phacoemulsification with Toric posterior chamber intraocular lens placement of the left eye.  Ultrasound time: Procedure(s) with comments: CATARACT EXTRACTION PHACO AND INTRAOCULAR LENS PLACEMENT (IOC) LEFT PANOPTIX TORIC LENS  00:50.0  20.3%  10.21 (Left) - sleep apnea requests early LENS:TFNT30 24.0 D Toric intraocular lens with 1.5 diopters of cylindrical power with axis orientation at 22 degrees.   ULTRASOUND TIME: 20 % of 0 minutes, 50 seconds.  CDE 10.2   SURGEON:  Wyonia Hough, MD   ANESTHESIA:  Topical with tetracaine drops and 2% Xylocaine jelly, augmented with 1% preservative-free intracameral lidocaine.  COMPLICATIONS:  None.   DESCRIPTION OF PROCEDURE:  The patient was identified in the holding room and transported to the operating suite and placed in the supine position under the operating microscope.  The left eye was identified as the operative eye, and it was prepped and draped in the usual sterile ophthalmic fashion.    A clear-corneal paracentesis incision was made at the 1:30 position.  0.5 ml of preservative-free 1% lidocaine was injected into the anterior chamber. The anterior chamber was filled with Viscoat.  A 2.4 millimeter near clear corneal incision was then made at the 10:30 position.  A cystotome and capsulorrhexis forceps were then used to make a curvilinear capsulorrhexis.  Hydrodissection and hydrodelineation were then performed using balanced salt solution.   Phacoemulsification was then used in stop and chop fashion to remove the lens, nucleus and epinucleus.  The remaining cortex was aspirated using the irrigation and aspiration handpiece.  Provisc viscoelastic was then placed into the capsular bag to distend it for lens placement.  The Verion digital  marker was used to align the implant at the intended axis.   A 24.0 diopter lens was then injected into the capsular bag.  It was rotated clockwise until the axis marks on the lens were approximately 15 degrees in the counterclockwise direction to the intended alignment.  The viscoelastic was aspirated from the eye using the irrigation aspiration handpiece.  Then, a Koch spatula through the sideport incision was used to rotate the lens in a clockwise direction until the axis markings of the intraocular lens were lined up with the Verion alignment.  Balanced salt solution was then used to hydrate the wounds. Cefuroxime 0.1 ml of a 10mg /ml solution was injected into the anterior chamber for a dose of 1 mg of intracameral antibiotic at the completion of the case.    The eye was noted to have a physiologic pressure and there was no wound leak noted.   Timolol and Brimonidine drops were applied to the eye.  The patient was taken to the recovery room in stable condition having had no complications of anesthesia or surgery.  Delia Slatten 06/11/2019, 1:30 PM

## 2019-06-11 NOTE — Transfer of Care (Signed)
Immediate Anesthesia Transfer of Care Note  Patient: Sarah Harper  Procedure(s) Performed: CATARACT EXTRACTION PHACO AND INTRAOCULAR LENS PLACEMENT (IOC) LEFT PANOPTIX TORIC LENS  00:50.0  20.3%  10.21 (Left Eye)  Patient Location: PACU  Anesthesia Type: MAC  Level of Consciousness: awake, alert  and patient cooperative  Airway and Oxygen Therapy: Patient Spontanous Breathing and Patient connected to supplemental oxygen  Post-op Assessment: Post-op Vital signs reviewed, Patient's Cardiovascular Status Stable, Respiratory Function Stable, Patent Airway and No signs of Nausea or vomiting  Post-op Vital Signs: Reviewed and stable  Complications: No apparent anesthesia complications

## 2019-06-11 NOTE — Anesthesia Procedure Notes (Signed)
Procedure Name: MAC Performed by: Izetta Dakin, CRNA Pre-anesthesia Checklist: Timeout performed, Patient being monitored, Patient identified, Emergency Drugs available and Suction available Patient Re-evaluated:Patient Re-evaluated prior to induction Oxygen Delivery Method: Nasal cannula

## 2019-06-12 ENCOUNTER — Encounter: Payer: Self-pay | Admitting: Ophthalmology

## 2019-06-27 DIAGNOSIS — G4733 Obstructive sleep apnea (adult) (pediatric): Secondary | ICD-10-CM | POA: Diagnosis not present

## 2019-06-30 ENCOUNTER — Other Ambulatory Visit: Payer: Self-pay

## 2019-06-30 ENCOUNTER — Ambulatory Visit
Admission: RE | Admit: 2019-06-30 | Discharge: 2019-06-30 | Disposition: A | Discharge status: Home / Self Care | Payer: Medicare HMO | Source: Ambulatory Visit | Attending: Podiatry | Admitting: Podiatry

## 2019-06-30 DIAGNOSIS — M79671 Pain in right foot: Secondary | ICD-10-CM | POA: Diagnosis not present

## 2019-06-30 DIAGNOSIS — M722 Plantar fascial fibromatosis: Secondary | ICD-10-CM | POA: Diagnosis not present

## 2019-07-07 DIAGNOSIS — M25561 Pain in right knee: Secondary | ICD-10-CM | POA: Diagnosis not present

## 2019-07-09 ENCOUNTER — Telehealth: Payer: Self-pay

## 2019-07-09 NOTE — Telephone Encounter (Signed)
Request for MRI disc has been faxed to [facility] 

## 2019-07-09 NOTE — Telephone Encounter (Signed)
-----   Message from Garrel Ridgel, [place] sent at 07/02/2019  1:54 PM EST ----- Send for overread and inform patient of delay

## 2019-08-06 ENCOUNTER — Ambulatory Visit: Payer: Medicare HMO | Admitting: Podiatry

## 2019-08-12 DIAGNOSIS — G4733 Obstructive sleep apnea (adult) (pediatric): Secondary | ICD-10-CM | POA: Diagnosis not present

## 2019-08-20 ENCOUNTER — Encounter: Payer: Self-pay | Admitting: Podiatry

## 2019-08-20 ENCOUNTER — Other Ambulatory Visit: Payer: Self-pay

## 2019-08-20 ENCOUNTER — Ambulatory Visit (INDEPENDENT_AMBULATORY_CARE_PROVIDER_SITE_OTHER): Payer: Medicare HMO | Admitting: Podiatry

## 2019-08-20 DIAGNOSIS — M722 Plantar fascial fibromatosis: Secondary | ICD-10-CM

## 2019-08-20 NOTE — Progress Notes (Signed)
She presents today for follow-up of her MRI and the MRI results.  She states that she still feels pain in the plantar heels.  Objective: Vital signs are stable she is alert oriented x3 MRI does state plantar fasciitis.  She has pain on palpation medial calcaneal tubercles bilateral.  Assessment: Plantar fasciitis.  Plan: I injected the area today 10 mg Kenalog 5 mg Marcaine point of maximal tenderness bilateral heels.  Tolerated procedure well.  Follow-up with me as needed.

## 2019-08-25 DIAGNOSIS — L821 Other seborrheic keratosis: Secondary | ICD-10-CM | POA: Diagnosis not present

## 2019-08-25 DIAGNOSIS — D485 Neoplasm of uncertain behavior of skin: Secondary | ICD-10-CM | POA: Diagnosis not present

## 2019-08-25 DIAGNOSIS — X32XXXA Exposure to sunlight, initial encounter: Secondary | ICD-10-CM | POA: Diagnosis not present

## 2019-08-25 DIAGNOSIS — Z08 Encounter for follow-up examination after completed treatment for malignant neoplasm: Secondary | ICD-10-CM | POA: Diagnosis not present

## 2019-08-25 DIAGNOSIS — Z85828 Personal history of other malignant neoplasm of skin: Secondary | ICD-10-CM | POA: Diagnosis not present

## 2019-08-25 DIAGNOSIS — D229 Melanocytic nevi, unspecified: Secondary | ICD-10-CM | POA: Diagnosis not present

## 2019-08-25 DIAGNOSIS — L570 Actinic keratosis: Secondary | ICD-10-CM | POA: Diagnosis not present

## 2019-09-03 ENCOUNTER — Encounter: Payer: Self-pay | Admitting: Family Medicine

## 2019-09-05 NOTE — Telephone Encounter (Signed)
Dr. Glori Bickers,   Please see my follow up communication with the patient in mychart.  I will share this feedback as I am sad to say this has been the experience of many.  Hopefully by sharing this feedback in regards to [facility]'s process, they can work to improve the workflow.   Thanks.    I have copied our clinical manager, Lorenda Peck on this as well.

## 2019-09-12 ENCOUNTER — Ambulatory Visit: Payer: Medicare HMO | Attending: Internal Medicine

## 2019-09-12 DIAGNOSIS — Z23 Encounter for immunization: Secondary | ICD-10-CM | POA: Insufficient documentation

## 2019-09-12 DIAGNOSIS — G4733 Obstructive sleep apnea (adult) (pediatric): Secondary | ICD-10-CM | POA: Diagnosis not present

## 2019-09-26 ENCOUNTER — Ambulatory Visit: Payer: Medicare HMO | Admitting: Family Medicine

## 2019-09-26 ENCOUNTER — Other Ambulatory Visit: Payer: Self-pay

## 2019-09-26 ENCOUNTER — Encounter: Payer: Self-pay | Admitting: Family Medicine

## 2019-09-26 VITALS — BP 122/80 | HR 82 | Temp 97.8°F | Ht 65.0 in | Wt 132.4 lb

## 2019-09-26 DIAGNOSIS — G44209 Tension-type headache, unspecified, not intractable: Secondary | ICD-10-CM | POA: Diagnosis not present

## 2019-09-26 DIAGNOSIS — G8929 Other chronic pain: Secondary | ICD-10-CM | POA: Diagnosis not present

## 2019-09-26 DIAGNOSIS — M25561 Pain in right knee: Secondary | ICD-10-CM | POA: Diagnosis not present

## 2019-09-26 DIAGNOSIS — R519 Headache, unspecified: Secondary | ICD-10-CM | POA: Insufficient documentation

## 2019-09-26 DIAGNOSIS — S80211D Abrasion, right knee, subsequent encounter: Secondary | ICD-10-CM | POA: Diagnosis not present

## 2019-09-26 NOTE — Assessment & Plan Note (Signed)
H/o migraines - has them infrequently  Had one last night that was L sided  Resolved with fioricet  Urged caution of sedation/falls with this medicine Nl exam today-no neuro findings or changes  inst to update [place] if headaches become worse or more frequent

## 2019-09-26 NOTE — Assessment & Plan Note (Signed)
Original abrasion of knee cap is nicely healed and may leave a scar  There is a superficial hematoma just below it which may take longer to resolve A small area of that is debrided (? Recent trauma) -for that recommend soap and water cleansing and abx ointment bid (cover if needed) Disc s/s of infection to watch for  Disc fall prevention

## 2019-09-26 NOTE — Progress Notes (Signed)
Subjective:    Patient ID: Sarah Harper, female    DOB: 1940-08-27, 79 y.o.   MRN: [id number]  This visit occurred during the SARS-CoV-2 public health emergency.  Safety protocols were in place, including screening questions prior to the visit, additional usage of staff PPE, and extensive cleaning of exam room while observing appropriate contact time as indicated for disinfecting solutions.    HPI Pt presents for leg pain   Wt Readings from Last 3 Encounters:  09/26/19 132 lb 7 oz (60.1 kg)  06/11/19 130 lb 4.8 oz (59.1 kg)  06/02/19 129 lb (58.5 kg)   22.04 kg/m   Had a fall on R knee in sept -hit hard  She saw Dr Lisette Grinder right after the fall - and it was xrayed-no fractures    She has scab on her knee  Has bruising below knee (superficial)  Woke up with a small area of bleeding next to it  Has not re traumatized it   Both knees have arthritis  Has had injections in both knees (for a while)  Never had lubricant   Usually voltaren gel helps uses qd-bid   Since the injury- symptoms have improved some  Originally a lot of blood- that stopped  Now healing  Not a lot of pain  No swelling in her knee  Does not feel unstable at all  She was most concerned about the dark area   No asa or blood thinners currently    A headache recently (last night) -- took 1/2 of a fioricet and it helped  Sees floating "designs" in her vision of R eye occ  Has not told her eye doctor about it  She thought it was aura to a migraine  She used to have migraines   No facial droop/ focal weakness  No h/o CVA  Cataract surgery in sept and 3 wk later - both eyes  Vision is better    Patient Active Problem List   Diagnosis Date Noted  . Right knee pain 09/26/2019  . Headache 09/26/2019  . Fall 06/02/2019  . Abrasion of right knee 06/02/2019  . Urinary retention 05/09/2019  . Syncope and collapse 12/12/2018  . Colon cancer screening 02/05/2018  . Degenerative joint disease  (DJD) of lumbar spine 05/02/2017  . Internal hemorrhoids 05/02/2017  . Generalized osteoarthritis of hand 12/11/2016  . Stress reaction 12/11/2016  . Estrogen deficiency 05/11/2016  . Encounter for screening mammogram for breast cancer 05/11/2016  . OSA (obstructive sleep apnea) 03/06/2016  . Fatigue 01/25/2016  . Right ankle swelling 10/18/2015  . Left knee pain 10/18/2015  . Snoring 10/18/2015  . Routine general medical examination at a health care facility 02/28/2015  . Encounter for Medicare annual wellness exam 02/06/2013  . Hyperlipidemia 08/27/2012  . Irregular heart beat 08/05/2012  . Gynecological examination 12/20/2010  . BARRETTS ESOPHAGUS 10/01/2010  . Osteopenia 12/17/2009  . BACK PAIN, LUMBAR 07/02/2009  . IRRITABLE BOWEL SYNDROME 04/02/2009  . POSTMENOPAUSAL STATUS 12/10/2008  . Hypothyroidism 12/05/2007  . Meniere's disease 12/05/2007  . GERD 12/05/2007   Past Medical History:  Diagnosis Date  . DDD (degenerative disc disease)    chronic back pain  . ETD (eustachian tube dysfunction)   . GERD (gastroesophageal reflux disease)   . IBS (irritable bowel syndrome)   . Meniere's disease   . Migraine    still gets visual aura from time to time  . Osteoporosis   . Sleep apnea    CPAP  .  Syncopal episodes    after work-up - possible seizures?  . Thyroid disease    hypothyroid   Past Surgical History:  Procedure Laterality Date  . APPENDECTOMY    . CATARACT EXTRACTION W/PHACO Right 05/21/2019   Procedure: CATARACT EXTRACTION PHACO AND INTRAOCULAR LENS PLACEMENT ([place]) RIGHT panoptix lens  00:35.0  21.7%  7.61;  Surgeon: Leandrew Koyanagi, MD;  Location: [facility];  Service: Ophthalmology;  Laterality: Right;  sleep apnea requests early  . CATARACT EXTRACTION W/PHACO Left 06/11/2019   Procedure: CATARACT EXTRACTION PHACO AND INTRAOCULAR LENS PLACEMENT ([facility]) LEFT PANOPTIX TORIC LENS  00:50.0  20.3%  10.21;  Surgeon: Leandrew Koyanagi, MD;   Location: [facility];  Service: Ophthalmology;  Laterality: Left;  sleep apnea requests early  . COLONOSCOPY     last 2012 - [facility]  . ESOPHAGOGASTRODUODENOSCOPY     Multiple, last 01/07/2017 with Savary dilation to 18 mm  . HEMORRHOID BANDING  2018   [facility]  . LUMBAR DISC SURGERY  1984   L5    Social History   Tobacco Use  . Smoking status: Never Smoker  . Smokeless tobacco: Never Used  Substance Use Topics  . Alcohol use: Yes    Alcohol/week: 1.0 standard drinks    Types: 1 Glasses of wine per week    Comment: wine occasional  . Drug use: No   Family History  Problem Relation Age of Onset  . Diabetes Paternal Aunt   . Hypertension Maternal Grandmother   . Hypertension Maternal Grandfather    Allergies  Allergen Reactions  . Nsaids     GI discomfort and gastritis  . Tolmetin Other (See Comments)    GI discomfort and gastritis   Current Outpatient Medications on File Prior to Visit  Medication Sig Dispense Refill  . ALPRAZolam (XANAX) 0.25 MG tablet Take 1 tablet (0.25 mg total) by mouth 2 (two) times daily as needed for anxiety (with caution of sedation). 30 tablet 0  . b complex vitamins tablet Take 1 tablet by mouth daily.    . butalbital-acetaminophen-caffeine (FIORICET) 50-325-40 MG tablet Take 1 tablet by mouth every 6 (six) hours as needed. 90 tablet 0  . Calcium Carb-Cholecalciferol (CALCIUM 600 + D PO) Take 1 tablet by mouth daily.    Marland Kitchen dexlansoprazole (DEXILANT) 60 MG capsule Take 1 capsule (60 mg total) by mouth daily. 90 capsule 3  . diclofenac sodium (VOLTAREN) 1 % GEL APPLY 2 GRAMS TOPICALLY FOUR TIMES A DAY TO AFFECTED AREAS 300 g 3  . estradiol (ESTRACE) 0.1 MG/GM vaginal cream Insert small amount (1 cm) in vagina every other day 42.5 g 3  . levothyroxine (SYNTHROID) 88 MCG tablet Take 1 tablet (88 mcg total) by mouth daily before breakfast. 90 tablet 3  . linaclotide (LINZESS) 145 MCG CAPS capsule Take 1 capsule (145 mcg total) by mouth daily. 90  capsule 3  . mometasone (NASONEX) 50 MCG/ACT nasal spray Place 2 sprays into the nose daily as needed.    . Multiple Vitamin (MULTI VITAMIN PO) Multi Vitamin with Biotin    . pseudoephedrine (SUDAFED) 60 MG tablet Take 60 mg by mouth every 4 (four) hours as needed for congestion.    . sucralfate (CARAFATE) 1 g tablet Take 1 tablet (1 g total) by mouth 4 (four) times daily -  with meals and at bedtime. 120 tablet 2   No current facility-administered medications on file prior to visit.    Review of Systems  Constitutional: Negative for activity change, appetite  change, fatigue, fever and unexpected weight change.  HENT: Negative for congestion, ear pain, rhinorrhea, sinus pressure and sore throat.   Eyes: Negative for pain, redness and visual disturbance.  Respiratory: Negative for cough, shortness of breath and wheezing.   Cardiovascular: Negative for chest pain and palpitations.  Gastrointestinal: Negative for abdominal pain, blood in stool, constipation and diarrhea.  Endocrine: Negative for polydipsia and polyuria.  Genitourinary: Negative for dysuria, frequency and urgency.  Musculoskeletal: Positive for arthralgias. Negative for back pain and myalgias.  Skin: Positive for wound. Negative for pallor and rash.  Allergic/Immunologic: Negative for environmental allergies.  Neurological: Positive for headaches. Negative for dizziness and syncope.       Headache is now resolved   Hematological: Negative for adenopathy. Does not bruise/bleed easily.  Psychiatric/Behavioral: Negative for decreased concentration and dysphoric mood. The patient is not nervous/anxious.        Objective:   Physical Exam Constitutional:      General: She is not in acute distress.    Appearance: Normal appearance. She is normal weight. She is not ill-appearing.  HENT:     Mouth/Throat:     Mouth: Mucous membranes are moist.  Eyes:     General: No scleral icterus.    Conjunctiva/sclera: Conjunctivae normal.      Pupils: Pupils are equal, round, and reactive to light.  Cardiovascular:     Rate and Rhythm: Normal rate and regular rhythm.     Pulses: Normal pulses.     Heart sounds: Normal heart sounds.  Pulmonary:     Effort: Pulmonary effort is normal. No respiratory distress.     Breath sounds: Normal breath sounds. No wheezing or rales.  Musculoskeletal:     Cervical back: Normal range of motion and neck supple.     Comments: R knee- healing abrasions and hematoma  No patella tenderness Some crepitus   Lymphadenopathy:     Cervical: No cervical adenopathy.  Skin:    General: Skin is warm and dry.     Coloration: Skin is not jaundiced or pale.     Findings: Bruising present. No rash.     Comments: Healed scab on R patella  2 cm area of superficial ecchymosis ( hematoma) near tibial tuberosity  Small recent abrasion lateral to this (less thatn 1 cm skin tear) -not actively bleeding     Neurological:     Mental Status: She is alert. Mental status is at baseline.     Cranial Nerves: No cranial nerve deficit, dysarthria or facial asymmetry.     Sensory: No sensory deficit.     Motor: No weakness, tremor or pronator drift.     Coordination: Romberg sign negative. Coordination normal.     Gait: Gait is intact. Gait and tandem walk normal.     Deep Tendon Reflexes: Reflexes normal.  Psychiatric:        Mood and Affect: Mood normal.           Assessment & Plan:   Problem List Items Addressed This Visit      Musculoskeletal and Integument   Abrasion of right knee - Primary    Original abrasion of knee cap is nicely healed and may leave a scar  There is a superficial hematoma just below it which may take longer to resolve A small area of that is debrided (? Recent trauma) -for that recommend soap and water cleansing and abx ointment bid (cover if needed) Disc s/s of infection to watch for  Disc  fall prevention         Other   Right knee pain    With OA No more than  baseline right now        Headache    H/o migraines - has them infrequently  Had one last night that was L sided  Resolved with fioricet  Urged caution of sedation/falls with this medicine Nl exam today-no neuro findings or changes  inst to update [place] if headaches become worse or more frequent

## 2019-09-26 NOTE — Assessment & Plan Note (Signed)
With OA No more than baseline right now

## 2019-09-26 NOTE — Patient Instructions (Addendum)
The scab on your knee looks ok  The bruise (hematoma) underneath it will take a long  You have a scab/scrape - you can use antibiotic ointment and loose covering if needed to protect it   Let us know if this does not heal or gets worse   If headache returns or becomes frequent please let us know  Make sure you drink enough fluids Also limit caffeine

## 2019-10-03 ENCOUNTER — Ambulatory Visit: Payer: Medicare HMO | Attending: Internal Medicine

## 2019-10-03 DIAGNOSIS — Z23 Encounter for immunization: Secondary | ICD-10-CM | POA: Insufficient documentation

## 2019-10-03 NOTE — Progress Notes (Signed)
Patient was checked out after observation for 15 mins  at the [facility]  vaccine clinic with no adverse reaction. Mrs. Backes approached staff RN and stated that as she was leaving she became nauseated, heart "felt funny" and felt that she should come back into observation area after using bathroom. She also reported feeling light headed. Everardo All, RN walked her back to observation area at approximately 0950. Per [place] RN, pt began complaining of  Some chest tightness bp was 173/109. Patient give water, feet elevated and EMS arrived transporting patient to EMS room for further evaluation. Husband with patient at time of events.

## 2019-10-03 NOTE — Progress Notes (Signed)
   Covid-19 Vaccination Clinic  Name:  Sarah Harper    MRN: [id number] DOB: 1941-08-05  10/03/2019  [name] was observed post Covid-19 immunization for 15 minutes without incidence. She was provided with Vaccine Information Sheet and instruction to access the V-Safe system.   Ms. Big was instructed to call 911 with any severe reactions post vaccine: Marland Kitchen Difficulty breathing  . Swelling of your face and throat  . A fast heartbeat  . A bad rash all over your body  . Dizziness and weakness    Immunizations Administered    Name Date Dose VIS Date Route   Pfizer COVID-19 Vaccine 10/03/2019  9:18 AM 0.3 mL 08/01/2019 Intramuscular   Manufacturer: [place]   Lot: [id number]   [place]: [id number]

## 2019-10-23 ENCOUNTER — Encounter: Payer: Self-pay | Admitting: Family Medicine

## 2020-01-02 ENCOUNTER — Ambulatory Visit: Payer: Medicare HMO | Admitting: Internal Medicine

## 2020-01-02 ENCOUNTER — Encounter: Payer: Self-pay | Admitting: Internal Medicine

## 2020-01-02 VITALS — BP 108/72 | HR 68 | Temp 98.3°F | Ht 65.0 in | Wt 137.8 lb

## 2020-01-02 DIAGNOSIS — R490 Dysphonia: Secondary | ICD-10-CM | POA: Diagnosis not present

## 2020-01-02 DIAGNOSIS — K219 Gastro-esophageal reflux disease without esophagitis: Secondary | ICD-10-CM | POA: Diagnosis not present

## 2020-01-02 DIAGNOSIS — R0982 Postnasal drip: Secondary | ICD-10-CM | POA: Diagnosis not present

## 2020-01-02 NOTE — Progress Notes (Signed)
OMNI STROHMAIER 79 y.o. 24-Dec-1940 [id number]  Assessment & Plan:   Encounter Diagnoses  Name Primary?  . Gastroesophageal reflux disease, unspecified whether esophagitis present Yes  . Hoarseness   . Post-nasal drip     Continue sucralfate. If she fails to get a response in the next couple weeks or so she will let me know and we will probably perform an upper endoscopy. Continue allergy treatments. I do wonder if the heavy pollen and postnasal drip she describes though they have been chronic and recurrent might be contributing.  CC: Tower, [name], MD    Subjective:   Chief Complaint: Throat burning reflux upper abdominal pain  HPI The patient is here with burning in her throat and chest area. It can occur without or with swallowing. It is bothersome, and is reminiscent of what I treated her for with sucralfate a year ago when we had a telehealth visit. I had her do a modified barium swallow which showed some minor abnormalities but nothing major and nothing significant. After using sucralfate for 2 or 3 months last year though symptoms improved but they have come back in the last month or so. She is about 2 or 3 weeks into another sucralfate 4 times a day prescription but has not noted benefit as of yet. There is no impact dysphagia. Her weight is up some. Sometimes she has bloating and discomfort after eating not consistently.  She has been followed over the years by an otolaryngologist named Dr. Richardson Landry in [place] and has had exams of her larynx and throat with fiberoptic instruments based upon the history I get. She is mainly followed for sleep apnea which she treats her for but she has had those exams and the hoarseness is sort of a chronic recurrent thing at times.  Wt Readings from Last 3 Encounters:  01/02/20 137 lb 12.8 oz (62.5 kg)  09/26/19 132 lb 7 oz (60.1 kg)  06/11/19 130 lb 4.8 oz (59.1 kg)   She does take 2 nasal sprays for allergies and has a postnasal drip  problem which seems to be flaring a little bit. History of seasonal allergies as noted. Allergies  Allergen Reactions  . Nsaids     GI discomfort and gastritis  . Tolmetin Other (See Comments)    GI discomfort and gastritis   Current Meds  Medication Sig  . ALPRAZolam (XANAX) 0.25 MG tablet Take 1 tablet (0.25 mg total) by mouth 2 (two) times daily as needed for anxiety (with caution of sedation).  Marland Kitchen b complex vitamins tablet Take 1 tablet by mouth daily.  . butalbital-acetaminophen-caffeine (FIORICET) 50-325-40 MG tablet Take 1 tablet by mouth every 6 (six) hours as needed.  . Calcium Carb-Cholecalciferol (CALCIUM 600 + D PO) Take 1 tablet by mouth daily.  Marland Kitchen dexlansoprazole (DEXILANT) 60 MG capsule Take 1 capsule (60 mg total) by mouth daily.  . diclofenac sodium (VOLTAREN) 1 % GEL APPLY 2 GRAMS TOPICALLY FOUR TIMES A DAY TO AFFECTED AREAS  . estradiol (ESTRACE) 0.1 MG/GM vaginal cream Insert small amount (1 cm) in vagina every other day  . levothyroxine (SYNTHROID) 88 MCG tablet Take 1 tablet (88 mcg total) by mouth daily before breakfast.  . linaclotide (LINZESS) 145 MCG CAPS capsule Take 1 capsule (145 mcg total) by mouth daily.  . mometasone (NASONEX) 50 MCG/ACT nasal spray Place 2 sprays into the nose daily as needed.  . Multiple Vitamin (MULTI VITAMIN PO) Multi Vitamin with Biotin  . pseudoephedrine (SUDAFED) 60 MG  tablet Take 60 mg by mouth every 4 (four) hours as needed for congestion.  . sucralfate (CARAFATE) 1 g tablet Take 1 tablet (1 g total) by mouth 4 (four) times daily -  with meals and at bedtime.   Past Medical History:  Diagnosis Date  . DDD (degenerative disc disease)    chronic back pain  . ETD (eustachian tube dysfunction)   . GERD (gastroesophageal reflux disease)   . IBS (irritable bowel syndrome)   . Meniere's disease   . Migraine    still gets visual aura from time to time  . Osteoporosis   . Sleep apnea    CPAP  . Syncopal episodes    after work-up -  possible seizures?  . Thyroid disease    hypothyroid   Past Surgical History:  Procedure Laterality Date  . APPENDECTOMY    . CATARACT EXTRACTION W/PHACO Right 05/21/2019   Procedure: CATARACT EXTRACTION PHACO AND INTRAOCULAR LENS PLACEMENT ([place]) RIGHT panoptix lens  00:35.0  21.7%  7.61;  Surgeon: Leandrew Koyanagi, MD;  Location: [facility];  Service: Ophthalmology;  Laterality: Right;  sleep apnea requests early  . CATARACT EXTRACTION W/PHACO Left 06/11/2019   Procedure: CATARACT EXTRACTION PHACO AND INTRAOCULAR LENS PLACEMENT ([facility]) LEFT PANOPTIX TORIC LENS  00:50.0  20.3%  10.21;  Surgeon: Leandrew Koyanagi, MD;  Location: [facility];  Service: Ophthalmology;  Laterality: Left;  sleep apnea requests early  . COLONOSCOPY     last 2012 - [facility]  . ESOPHAGOGASTRODUODENOSCOPY     Multiple, last 01/07/2017 with Savary dilation to 18 mm  . HEMORRHOID BANDING  2018   [facility]  . LUMBAR DISC SURGERY  1984   L5    Social History   Social History Narrative   Married, husband is a type I diabetic.  No children.   Elderly mother in her 47s lives in an assisted living facility.   Very occasional white wine with dinner, never smoker no drug use.  Limited caffeine.   family history includes Diabetes in her paternal aunt; Hypertension in her maternal grandfather and maternal grandmother.   Review of Systems As above  Objective:   Physical Exam BP 108/72   Pulse 68   Temp 98.3 F (36.8 C)   Ht 5\' 5"  (1.651 m)   Wt 137 lb 12.8 oz (62.5 kg)   SpO2 97%   BMI 22.93 kg/m  Well-developed well-nourished elderly woman Voice somewhat hoarse Neck without lymphadenopathy mass or tenderness Oral cavity and proximal posterior pharynx look normal to me

## 2020-01-02 NOTE — Patient Instructions (Addendum)
As we discussed - finish the sucralfate, taking it just before meals and at bedtime and let me know if no better and we can do a scope test.  I appreciate the opportunity to care for you. Gatha Mayer, MD, [facility]

## 2020-01-10 ENCOUNTER — Other Ambulatory Visit: Payer: Self-pay | Admitting: Internal Medicine

## 2020-01-12 ENCOUNTER — Ambulatory Visit (INDEPENDENT_AMBULATORY_CARE_PROVIDER_SITE_OTHER): Payer: Medicare HMO

## 2020-01-12 ENCOUNTER — Other Ambulatory Visit: Payer: Self-pay

## 2020-01-12 ENCOUNTER — Ambulatory Visit: Payer: Medicare HMO | Admitting: Podiatry

## 2020-01-12 ENCOUNTER — Encounter: Payer: Self-pay | Admitting: Podiatry

## 2020-01-12 DIAGNOSIS — M7751 Other enthesopathy of right foot: Secondary | ICD-10-CM

## 2020-01-12 DIAGNOSIS — R600 Localized edema: Secondary | ICD-10-CM | POA: Diagnosis not present

## 2020-01-12 MED ORDER — METHYLPREDNISOLONE 4 MG PO TBPK: ORAL_TABLET | ORAL | 0 refills | Status: DC

## 2020-01-12 NOTE — Progress Notes (Signed)
She presents today for swollen right ankle.  States is been going on for 2 to 3 weeks she states that is more irritation than pain she states that it seems to go down at nighttime but worse during the day and evening.  I have some numbness in my heel.  Objective: Vital signs are stable she is alert oriented x3 there is no erythema there is some mild edema to the right ankle some edema to the left ankle as well though not as grand.  Appears to be pitting.  Pulses are palpable.  Radiographs taken today demonstrate no osseous abnormalities in these areas.  Ankle mortise is normal  Assessment: Most likely this is venous insufficiency.  Plan: We will start her on a little Medrol Dosepak to see if we can suck some of this fluid out on her.  If this does not work.  We will simply make an appointment for vascular venous studies.

## 2020-01-26 ENCOUNTER — Encounter: Payer: Self-pay | Admitting: Podiatry

## 2020-02-04 ENCOUNTER — Telehealth: Payer: Self-pay

## 2020-02-04 DIAGNOSIS — R600 Localized edema: Secondary | ICD-10-CM

## 2020-02-04 DIAGNOSIS — I872 Venous insufficiency (chronic) (peripheral): Secondary | ICD-10-CM

## 2020-02-04 NOTE — Telephone Encounter (Signed)
Patient called stated that when she took the Medrol dose pack, it really helped the swelling in her ankles and feet, but several days after, her ankle started swelling again.  She says "its not as bad as before but it continues to be puffy and feels tight, like a rubber band is around my ankle"   Per Dr. Stephenie Acres last office note, if no better, sent her for Vascular studies.  Referral has been placed and patient as been informed of referral to vascular.  She will continue to wear compression stockings during day as directed.

## 2020-02-06 ENCOUNTER — Ambulatory Visit: Payer: Medicare HMO | Admitting: Cardiology

## 2020-02-06 ENCOUNTER — Encounter: Payer: Self-pay | Admitting: Podiatry

## 2020-02-06 ENCOUNTER — Encounter: Payer: Self-pay | Admitting: Cardiology

## 2020-02-06 ENCOUNTER — Other Ambulatory Visit: Payer: Self-pay

## 2020-02-06 VITALS — BP 140/64 | HR 64 | Ht 66.0 in | Wt 136.2 lb

## 2020-02-06 DIAGNOSIS — R600 Localized edema: Secondary | ICD-10-CM | POA: Diagnosis not present

## 2020-02-06 DIAGNOSIS — I8393 Asymptomatic varicose veins of bilateral lower extremities: Secondary | ICD-10-CM | POA: Diagnosis not present

## 2020-02-06 NOTE — Progress Notes (Signed)
Cardiology Office Note:    Date:  02/06/2020   ID:  Sarah Harper, DOB Jun 24, 1941, MRN [id number]  PCP:  Sarah Greenspan, MD  [facility] Cardiologist:  Sarah Sable, MD  [place] Electrophysiologist:  None   Referring MD: Sarah Greenspan, MD   Chief Complaint  Patient presents with  . OTHER    Edema/Venous insufficiency. Meds reviewed verbally with pt.    History of Present Illness:    Sarah Harper is a 79 y.o. female with a hx of degenerative joint disease, GERD who presents due to edema. Patient states having lower extremity swelling over the past month or so. She was given prednisone with taper which helped with her swelling in the past. Usually the swelling in her legs or call after she has been on her feet for too long or towards the end of the day. She denies any history of heart disease, heart attacks, heart failure. She tries to raise her leg when she is sitting but she cannot sit for long due to back problems.  She had an echocardiogram on 01/2015 due to syncope which showed normal systolic function, EF 60 to 42%, normal diastolic function.  Past Medical History:  Diagnosis Date  . DDD (degenerative disc disease)    chronic back pain  . ETD (eustachian tube dysfunction)   . GERD (gastroesophageal reflux disease)   . IBS (irritable bowel syndrome)   . Meniere's disease   . Migraine    still gets visual aura from time to time  . Osteoporosis   . Sleep apnea    CPAP  . Syncopal episodes    after work-up - possible seizures?  . Thyroid disease    hypothyroid    Past Surgical History:  Procedure Laterality Date  . APPENDECTOMY    . CATARACT EXTRACTION W/PHACO Right 05/21/2019   Procedure: CATARACT EXTRACTION PHACO AND INTRAOCULAR LENS PLACEMENT ([place]) RIGHT panoptix lens  00:35.0  21.7%  7.61;  Surgeon: Sarah Koyanagi, MD;  Location: [facility];  Service: Ophthalmology;  Laterality: Right;  sleep apnea requests early  . CATARACT  EXTRACTION W/PHACO Left 06/11/2019   Procedure: CATARACT EXTRACTION PHACO AND INTRAOCULAR LENS PLACEMENT ([facility]) LEFT PANOPTIX TORIC LENS  00:50.0  20.3%  10.21;  Surgeon: Sarah Koyanagi, MD;  Location: [place];  Service: Ophthalmology;  Laterality: Left;  sleep apnea requests early  . COLONOSCOPY     last 2012 - [facility]  . ESOPHAGOGASTRODUODENOSCOPY     Multiple, last 01/07/2017 with Savary dilation to 18 mm  . HEMORRHOID BANDING  2018   [facility]  . LUMBAR DISC SURGERY  1984   L5     Current Medications: Current Meds  Medication Sig  . ALPRAZolam (XANAX) 0.25 MG tablet Take 1 tablet (0.25 mg total) by mouth 2 (two) times daily as needed for anxiety (with caution of sedation).  Marland Kitchen b complex vitamins tablet Take 1 tablet by mouth daily.  . butalbital-acetaminophen-caffeine (FIORICET) 50-325-40 MG tablet Take 1 tablet by mouth every 6 (six) hours as needed.  . Calcium Carb-Cholecalciferol (CALCIUM 600 + D PO) Take 1 tablet by mouth daily.  Marland Kitchen dexlansoprazole (DEXILANT) 60 MG capsule Take 1 capsule (60 mg total) by mouth daily.  . diclofenac sodium (VOLTAREN) 1 % GEL APPLY 2 GRAMS TOPICALLY FOUR TIMES A DAY TO AFFECTED AREAS  . estradiol (ESTRACE) 0.1 MG/GM vaginal cream Insert small amount (1 cm) in vagina every other day  . levothyroxine (SYNTHROID) 88 MCG tablet Take 1  tablet (88 mcg total) by mouth daily before breakfast.  . linaclotide (LINZESS) 145 MCG CAPS capsule Take 1 capsule (145 mcg total) by mouth daily.  . mometasone (NASONEX) 50 MCG/ACT nasal spray Place 2 sprays into the nose daily as needed.  . pseudoephedrine (SUDAFED) 60 MG tablet Take 60 mg by mouth every 4 (four) hours as needed for congestion.     Allergies:   Nsaids and Tolmetin   Social History   Socioeconomic History  . Marital status: Married    Spouse name: Sarah Harper  . Number of children: 0  . Years of education: Not on file  . Highest education level: Not on file  Occupational History  .  Occupation: Retired  Tobacco Use  . Smoking status: Never Smoker  . Smokeless tobacco: Never Used  Vaping Use  . Vaping Use: Never used  Substance and Sexual Activity  . Alcohol use: Yes    Alcohol/week: 1.0 standard drink    Types: 1 Glasses of wine per week    Comment: wine occasional  . Drug use: No  . Sexual activity: Not on file  Other Topics Concern  . Not on file  Social History Narrative   Married, husband is a type I diabetic.  No children.   Elderly mother in her 45s lives in an assisted living facility.   Very occasional white wine with dinner, never smoker no drug use.  Limited caffeine.   Social Determinants of Health   Financial Resource Strain:   . Difficulty of Paying Living Expenses:   Food Insecurity:   . Worried About Charity fundraiser in the Last Year:   . Arboriculturist in the Last Year:   Transportation Needs:   . Film/video editor (Medical):   Marland Kitchen Lack of Transportation (Non-Medical):   Physical Activity:   . Days of Exercise per Week:   . Minutes of Exercise per Session:   Stress:   . Feeling of Stress :   Social Connections:   . Frequency of Communication with Friends and Family:   . Frequency of Social Gatherings with Friends and Family:   . Attends Religious Services:   . Active Member of Clubs or Organizations:   . Attends Archivist Meetings:   Marland Kitchen Marital Status:      Family History: The patient's family history includes Diabetes in her paternal aunt; Hypertension in her maternal grandfather and maternal grandmother.  ROS:   Please see the history of present illness.     All other systems reviewed and are negative.  EKGs/Labs/Other Studies Reviewed:    The following studies were reviewed today:   EKG:  EKG is  ordered today.  The ekg ordered today demonstrates normal sinus rhythm, normal ECG.  Recent Labs: 03/03/2019: ALT 16; BUN 10; Creatinine, Ser 0.67; Hemoglobin 13.7; Platelets 282.0; Potassium 4.1; Sodium 133;  TSH 3.46  Recent Lipid Panel    Component Value Date/Time   CHOL 199 03/03/2019 0741   TRIG 61.0 03/03/2019 0741   HDL 91.60 03/03/2019 0741   CHOLHDL 2 03/03/2019 0741   VLDL 12.2 03/03/2019 0741   LDLCALC 96 03/03/2019 0741   LDLDIRECT 115.5 02/12/2013 0755    Physical Exam:    VS:  BP 140/64 (BP Location: Right Arm, Patient Position: Sitting, Cuff Size: Normal)   Pulse 64   Ht 5\' 6"  (1.676 m)   Wt 136 lb 4 oz (61.8 kg)   SpO2 96%   BMI 21.99 kg/m  Wt Readings from Last 3 Encounters:  02/06/20 136 lb 4 oz (61.8 kg)  01/02/20 137 lb 12.8 oz (62.5 kg)  09/26/19 132 lb 7 oz (60.1 kg)     GEN:  Well nourished, well developed in no acute distress HEENT: Normal NECK: No JVD; No carotid bruits LYMPHATICS: No lymphadenopathy CARDIAC: RRR, 1/6 systolic murmur RESPIRATORY:  Clear to auscultation without rales, wheezing or rhonchi  ABDOMEN: Soft, non-tender, non-distended MUSCULOSKELETAL: Trace edema; bilateral varicose veins noted SKIN: Warm and dry NEUROLOGIC:  Alert and oriented x 3 PSYCHIATRIC:  Normal affect   ASSESSMENT:    1. Edema leg   2. Varicose veins of both lower extremities, unspecified whether complicated    PLAN:    In order of problems listed above:  1. Patient with edema associated with gravity which worsens towards the end of the day. Varicose veins also noted on my exam. Previous echocardiogram with normal systolic and diastolic function. Her symptoms are likely due to venous insufficiency. Conservative management with leg raising, compression stocking, Ace wraps advised. We will get a lower extremity ultrasound with reflux to evaluate for venous insufficiency. If this is positive, will refer patient to vein clinic for further management.  Follow-up after lower extremity ultrasound.   Medication Adjustments/Labs and Tests Ordered: Current medicines are reviewed at length with the patient today.  Concerns regarding medicines are outlined above.    Orders Placed This Encounter  Procedures  . EKG 12-Lead   No orders of the defined types were placed in this encounter.   Patient Instructions  Medication Instructions:  No Changes *If you need a refill on your cardiac medications before your next appointment, please call your pharmacy*   Lab Work: None Ordered If you have labs (blood work) drawn today and your tests are completely normal, you will receive your results only by: Marland Kitchen MyChart Message (if you have MyChart) OR . A paper copy in the mail If you have any lab test that is abnormal or we need to change your treatment, we will call you to review the results.   Testing/Procedures:  Lower extremity Venous Doppler with Reflux    Follow-Up: At [facility], you and your health needs are our priority.  As part of our continuing mission to provide you with exceptional heart care, we have created designated Provider Care Teams.  These Care Teams include your primary Cardiologist (physician) and Advanced Practice Providers (APPs -  Physician Assistants and Nurse Practitioners) who all work together to provide you with the care you need, when you need it.  We recommend signing up for the patient portal called "MyChart".  Sign up information is provided on this After Visit Summary.  MyChart is used to connect with patients for Virtual Visits (Telemedicine).  Patients are able to view lab/test results, encounter notes, upcoming appointments, etc.  Non-urgent messages can be sent to your provider as well.   To learn more about what you can do with MyChart, go to [email or web address].    Your next appointment:   Follow up after   Lower extremity Venous Doppler with Reflux The format for your next appointment:   In Person  Provider:   Kate Sable, MD   Other Instructions N/A     Signed, Sarah Sable, MD  02/06/2020 4:33 PM    [place]

## 2020-02-06 NOTE — Patient Instructions (Signed)
Medication Instructions:  No Changes *If you need a refill on your cardiac medications before your next appointment, please call your pharmacy*   Lab Work: None Ordered If you have labs (blood work) drawn today and your tests are completely normal, you will receive your results only by: Marland Kitchen MyChart Message (if you have MyChart) OR . A paper copy in the mail If you have any lab test that is abnormal or we need to change your treatment, we will call you to review the results.   Testing/Procedures:  Lower extremity Venous Doppler with Reflux    Follow-Up: At [facility], you and your health needs are our priority.  As part of our continuing mission to provide you with exceptional heart care, we have created designated Provider Care Teams.  These Care Teams include your primary Cardiologist (physician) and Advanced Practice Providers (APPs -  Physician Assistants and Nurse Practitioners) who all work together to provide you with the care you need, when you need it.  We recommend signing up for the patient portal called "MyChart".  Sign up information is provided on this After Visit Summary.  MyChart is used to connect with patients for Virtual Visits (Telemedicine).  Patients are able to view lab/test results, encounter notes, upcoming appointments, etc.  Non-urgent messages can be sent to your provider as well.   To learn more about what you can do with MyChart, go to [email or web address].    Your next appointment:   Follow up after   Lower extremity Venous Doppler with Reflux The format for your next appointment:   In Person  Provider:   Kate Sable, MD   Other Instructions N/A

## 2020-02-27 ENCOUNTER — Ambulatory Visit (HOSPITAL_COMMUNITY)
Admission: RE | Admit: 2020-02-27 | Payer: Medicare HMO | Source: Ambulatory Visit | Attending: Podiatry | Admitting: Podiatry

## 2020-03-04 ENCOUNTER — Ambulatory Visit: Payer: Medicare HMO | Admitting: Cardiology

## 2020-03-04 ENCOUNTER — Telehealth: Payer: Self-pay | Admitting: Family Medicine

## 2020-03-04 DIAGNOSIS — E7800 Pure hypercholesterolemia, unspecified: Secondary | ICD-10-CM

## 2020-03-04 DIAGNOSIS — E039 Hypothyroidism, unspecified: Secondary | ICD-10-CM

## 2020-03-04 DIAGNOSIS — Z0000 Encounter for general adult medical examination without abnormal findings: Secondary | ICD-10-CM

## 2020-03-04 NOTE — Telephone Encounter (Signed)
-----   Message from Ellamae Sia sent at 02/17/2020  3:32 PM EDT ----- Regarding: Lab orders for Friday, 7.16.21 Patient is scheduled for CPX labs, please order future labs, Thanks , Karna Christmas

## 2020-03-05 ENCOUNTER — Other Ambulatory Visit: Payer: Self-pay

## 2020-03-05 ENCOUNTER — Other Ambulatory Visit (INDEPENDENT_AMBULATORY_CARE_PROVIDER_SITE_OTHER): Payer: Medicare HMO

## 2020-03-05 DIAGNOSIS — E039 Hypothyroidism, unspecified: Secondary | ICD-10-CM

## 2020-03-05 DIAGNOSIS — E7800 Pure hypercholesterolemia, unspecified: Secondary | ICD-10-CM

## 2020-03-05 DIAGNOSIS — Z0000 Encounter for general adult medical examination without abnormal findings: Secondary | ICD-10-CM

## 2020-03-05 LAB — TSH: TSH: 1.58 u[IU]/mL (ref 0.35–4.50)

## 2020-03-05 LAB — COMPREHENSIVE METABOLIC PANEL
ALT: 17 U/L (ref 0–35)
AST: 20 U/L (ref 0–37)
Albumin: 4.5 g/dL (ref 3.5–5.2)
Alkaline Phosphatase: 83 U/L (ref 39–117)
BUN: 17 mg/dL (ref 6–23)
CO2: 32 mEq/L (ref 19–32)
Calcium: 9.7 mg/dL (ref 8.4–10.5)
Chloride: 98 mEq/L (ref 96–112)
Creatinine, Ser: 0.71 mg/dL (ref 0.40–1.20)
GFR: 79.46 mL/min (ref >60.00)
Glucose, Bld: 90 mg/dL (ref 70–99)
Potassium: 4.4 mEq/L (ref 3.5–5.1)
Sodium: 136 mEq/L (ref 135–145)
Total Bilirubin: 0.6 mg/dL (ref 0.2–1.2)
Total Protein: 6.5 g/dL (ref 6.0–8.3)

## 2020-03-05 LAB — CBC WITH DIFFERENTIAL/PLATELET
Basophils Absolute: 0.1 10*3/uL (ref 0.0–0.1)
Basophils Relative: 1.2 % (ref 0.0–3.0)
Eosinophils Absolute: 0.4 10*3/uL (ref 0.0–0.7)
Eosinophils Relative: 7.3 % — ABNORMAL HIGH (ref 0.0–5.0)
HCT: 40.6 % (ref 36.0–46.0)
Hemoglobin: 13.5 g/dL (ref 12.0–15.0)
Lymphocytes Relative: 28.6 % (ref 12.0–46.0)
Lymphs Abs: 1.5 10*3/uL (ref 0.7–4.0)
MCHC: 33.2 g/dL (ref 30.0–36.0)
MCV: 88.8 fl (ref 78.0–100.0)
Monocytes Absolute: 0.5 10*3/uL (ref 0.1–1.0)
Monocytes Relative: 10.1 % (ref 3.0–12.0)
Neutro Abs: 2.7 10*3/uL (ref 1.4–7.7)
Neutrophils Relative %: 52.8 % (ref 43.0–77.0)
Platelets: 251 10*3/uL (ref 150.0–400.0)
RBC: 4.57 Mil/uL (ref 3.87–5.11)
RDW: 13.7 % (ref 11.5–15.5)
WBC: 5.2 10*3/uL (ref 4.0–10.5)

## 2020-03-05 LAB — LIPID PANEL
Cholesterol: 197 mg/dL (ref 0–200)
HDL: 81.7 mg/dL (ref >39.00)
LDL Cholesterol: 101 mg/dL — ABNORMAL HIGH (ref 0–99)
NonHDL: 114.87
Total CHOL/HDL Ratio: 2
Triglycerides: 68 mg/dL (ref 0.0–149.0)
VLDL: 13.6 mg/dL (ref 0.0–40.0)

## 2020-03-09 ENCOUNTER — Ambulatory Visit (INDEPENDENT_AMBULATORY_CARE_PROVIDER_SITE_OTHER): Payer: Medicare HMO | Admitting: Family Medicine

## 2020-03-09 ENCOUNTER — Other Ambulatory Visit: Payer: Self-pay

## 2020-03-09 ENCOUNTER — Telehealth: Payer: Self-pay | Admitting: Cardiology

## 2020-03-09 ENCOUNTER — Encounter: Payer: Self-pay | Admitting: Family Medicine

## 2020-03-09 VITALS — BP 122/74 | HR 75 | Temp 97.4°F | Ht 64.75 in | Wt 133.4 lb

## 2020-03-09 DIAGNOSIS — Z1231 Encounter for screening mammogram for malignant neoplasm of breast: Secondary | ICD-10-CM

## 2020-03-09 DIAGNOSIS — M8589 Other specified disorders of bone density and structure, multiple sites: Secondary | ICD-10-CM

## 2020-03-09 DIAGNOSIS — H8109 Meniere's disease, unspecified ear: Secondary | ICD-10-CM

## 2020-03-09 DIAGNOSIS — Z0000 Encounter for general adult medical examination without abnormal findings: Secondary | ICD-10-CM

## 2020-03-09 DIAGNOSIS — E039 Hypothyroidism, unspecified: Secondary | ICD-10-CM | POA: Diagnosis not present

## 2020-03-09 DIAGNOSIS — E7800 Pure hypercholesterolemia, unspecified: Secondary | ICD-10-CM | POA: Diagnosis not present

## 2020-03-09 DIAGNOSIS — K22719 Barrett's esophagus with dysplasia, unspecified: Secondary | ICD-10-CM

## 2020-03-09 DIAGNOSIS — K219 Gastro-esophageal reflux disease without esophagitis: Secondary | ICD-10-CM

## 2020-03-09 MED ORDER — LINACLOTIDE 145 MCG PO CAPS: 145.0000 ug | ORAL_CAPSULE | Freq: Every day | ORAL | 3 refills | Status: DC

## 2020-03-09 MED ORDER — ALPRAZOLAM 0.25 MG PO TABS: 0.2500 mg | ORAL_TABLET | Freq: Two times a day (BID) | ORAL | 0 refills | Status: DC | PRN

## 2020-03-09 MED ORDER — LEVOTHYROXINE SODIUM 88 MCG PO TABS: 88.0000 ug | ORAL_TABLET | Freq: Every day | ORAL | 3 refills | Status: DC

## 2020-03-09 MED ORDER — ESTRADIOL 0.1 MG/GM VA CREA: TOPICAL_CREAM | VAGINAL | 3 refills | Status: DC

## 2020-03-09 MED ORDER — BUTALBITAL-APAP-CAFFEINE 50-325-40 MG PO TABS: 1.0000 | ORAL_TABLET | Freq: Four times a day (QID) | ORAL | 0 refills | Status: DC | PRN

## 2020-03-09 MED ORDER — DEXILANT 60 MG PO CPDR: 60.0000 mg | DELAYED_RELEASE_CAPSULE | Freq: Every day | ORAL | 3 refills | Status: DC

## 2020-03-09 NOTE — Assessment & Plan Note (Signed)
Declines another dexa No falls or fractures  Not much exercise due to chronic back pain  Takes vit D

## 2020-03-09 NOTE — Assessment & Plan Note (Signed)
Hypothyroidism  Pt has no clinical changes No change in energy level/ hair or skin/ edema and no tremor Lab Results  Component Value Date   TSH 1.58 03/05/2020

## 2020-03-09 NOTE — Telephone Encounter (Signed)
  Patient Consent for Virtual Visit         Sarah Harper has provided verbal consent on 03/09/2020 for a virtual visit (video or telephone).   CONSENT FOR VIRTUAL VISIT FOR:  Sarah Harper  By participating in this virtual visit I agree to the following:  I hereby voluntarily request, consent and authorize [facility] and its employed or contracted physicians, Engineer, materials, nurse practitioners or other licensed health care professionals (the Practitioner), to provide me with telemedicine health care services (the "Services") as deemed necessary by the treating Practitioner. I acknowledge and consent to receive the Services by the Practitioner via telemedicine. I understand that the telemedicine visit will involve communicating with the Practitioner through live audiovisual communication technology and the disclosure of certain medical information by electronic transmission. I acknowledge that I have been given the opportunity to request an in-person assessment or other available alternative prior to the telemedicine visit and am voluntarily participating in the telemedicine visit.  I understand that I have the right to withhold or withdraw my consent to the use of telemedicine in the course of my care at any time, without affecting my right to future care or treatment, and that the Practitioner or I may terminate the telemedicine visit at any time. I understand that I have the right to inspect all information obtained and/or recorded in the course of the telemedicine visit and may receive copies of available information for a reasonable fee.  I understand that some of the potential risks of receiving the Services via telemedicine include:  Marland Kitchen Delay or interruption in medical evaluation due to technological equipment failure or disruption; . Information transmitted may not be sufficient (e.g. poor resolution of images) to allow for appropriate medical decision making by the Practitioner;  and/or  . In rare instances, security protocols could fail, causing a breach of personal health information.  Furthermore, I acknowledge that it is my responsibility to provide information about my medical history, conditions and care that is complete and accurate to the best of my ability. I acknowledge that Practitioner's advice, recommendations, and/or decision may be based on factors not within their control, such as incomplete or inaccurate data provided by me or distortions of diagnostic images or specimens that may result from electronic transmissions. I understand that the practice of medicine is not an exact science and that Practitioner makes no warranties or guarantees regarding treatment outcomes. I acknowledge that a copy of this consent can be made available to me via my patient portal ([facility]), or I can request a printed copy by calling the office of [facility].    I understand that my insurance will be billed for this visit.   I have read or had this consent read to me. . I understand the contents of this consent, which adequately explains the benefits and risks of the Services being provided via telemedicine.  . I have been provided ample opportunity to ask questions regarding this consent and the Services and have had my questions answered to my satisfaction. . I give my informed consent for the services to be provided through the use of telemedicine in my medical care

## 2020-03-09 NOTE — Assessment & Plan Note (Signed)
Nl breast exam  Wants to start getting mammograms at [facility] so ref done Given number to call and schedule herself

## 2020-03-09 NOTE — Progress Notes (Signed)
Subjective:    Patient ID: Sarah Harper, female    DOB: 02/25/1941, 79 y.o.   MRN: [id number]  This visit occurred during the SARS-CoV-2 public health emergency.  Safety protocols were in place, including screening questions prior to the visit, additional usage of staff PPE, and extensive cleaning of exam room while observing appropriate contact time as indicated for disinfecting solutions.    HPI Here for health maintenance exam and to review chronic medical problems    Has amw upcoming   Wt Readings from Last 3 Encounters:  03/09/20 133 lb 7 oz (60.5 kg)  02/06/20 136 lb 4 oz (61.8 kg)  01/02/20 137 lb 12.8 oz (62.5 kg)   22.38 kg/m   Has been going to the doctor a lot lately   Sees GI- Dr Carlean Purl- for elevated esophagus  Last took sucralfate  Saw him for f/u - and now take this on and off  She was worried about eosinophil level   Hep C screen -declines/low risk  Mammogram 6/19  (neg after recall)  Will schedule schedule mammogram  Self breast exam -no lumps   Flu shot 9/20 Td 5/13 pna vaccines- complete covid-immunized  Zostavax 3/14- she had signed up for shingrix in the past and did not do it during pandemic    Colon cancer screening -colonoscopy 4/09  ifob neg 2/12 cologuard neg 6/19     dexa 12/17-osteopenia mixed trend- declines another dexa  Falls-one fall on knee- no fractures or injury  Fractures -none  Supplements- taking vit D Exercise -not a lot of exercise due to chronic back pain (walks on her back porch and does back exercises)  Doing balance exercises as well   Hypothyroidism  Pt has no clinical changes No change in energy level/ hair or skin/ edema and no tremor Lab Results  Component Value Date   TSH 1.58 03/05/2020      Hyperlipidemia  Lab Results  Component Value Date   CHOL 197 03/05/2020   CHOL 199 03/03/2019   CHOL 198 01/31/2018   Lab Results  Component Value Date   HDL 81.70 03/05/2020   HDL 91.60 03/03/2019    HDL 83.60 01/31/2018   Lab Results  Component Value Date   LDLCALC 101 (H) 03/05/2020   LDLCALC 96 03/03/2019   LDLCALC 98 01/31/2018   Lab Results  Component Value Date   TRIG 68.0 03/05/2020   TRIG 61.0 03/03/2019   TRIG 84.0 01/31/2018   Lab Results  Component Value Date   CHOLHDL 2 03/05/2020   CHOLHDL 2 03/03/2019   CHOLHDL 2 01/31/2018   Lab Results  Component Value Date   LDLDIRECT 115.5 02/12/2013   LDLDIRECT 109.9 12/19/2010   LDLDIRECT 117.7 12/17/2009   diet controlled  She eats healthy most of the time  Some ice cream   Lab Results  Component Value Date   WBC 5.2 03/05/2020   HGB 13.5 03/05/2020   HCT 40.6 03/05/2020   MCV 88.8 03/05/2020   PLT 251.0 03/05/2020   Lab Results  Component Value Date   CREATININE 0.71 03/05/2020   BUN 17 03/05/2020   NA 136 03/05/2020   K 4.4 03/05/2020   CL 98 03/05/2020   CO2 32 03/05/2020   Lab Results  Component Value Date   ALT 17 03/05/2020   AST 20 03/05/2020   ALKPHOS 83 03/05/2020   BILITOT 0.6 03/05/2020    menieres- bothers her about twice weekly   Takes fiorcet very rarely for her  back   Patient Active Problem List   Diagnosis Date Noted  . Right knee pain 09/26/2019  . Headache 09/26/2019  . Fall 06/02/2019  . Abrasion of right knee 06/02/2019  . Urinary retention 05/09/2019  . Syncope and collapse 12/12/2018  . Colon cancer screening 02/05/2018  . Degenerative joint disease (DJD) of lumbar spine 05/02/2017  . Internal hemorrhoids 05/02/2017  . Generalized osteoarthritis of hand 12/11/2016  . Stress reaction 12/11/2016  . Estrogen deficiency 05/11/2016  . Encounter for screening mammogram for breast cancer 05/11/2016  . OSA (obstructive sleep apnea) 03/06/2016  . Fatigue 01/25/2016  . Right ankle swelling 10/18/2015  . Left knee pain 10/18/2015  . Snoring 10/18/2015  . Routine general medical examination at a health care facility 02/28/2015  . Encounter for Medicare annual wellness  exam 02/06/2013  . Hyperlipidemia 08/27/2012  . Irregular heart beat 08/05/2012  . Gynecological examination 12/20/2010  . BARRETTS ESOPHAGUS 10/01/2010  . Osteopenia 12/17/2009  . BACK PAIN, LUMBAR 07/02/2009  . IRRITABLE BOWEL SYNDROME 04/02/2009  . POSTMENOPAUSAL STATUS 12/10/2008  . Hypothyroidism 12/05/2007  . Meniere's disease 12/05/2007  . GERD 12/05/2007   Past Medical History:  Diagnosis Date  . DDD (degenerative disc disease)    chronic back pain  . ETD (eustachian tube dysfunction)   . GERD (gastroesophageal reflux disease)   . IBS (irritable bowel syndrome)   . Meniere's disease   . Migraine    still gets visual aura from time to time  . Osteoporosis   . Sleep apnea    CPAP  . Syncopal episodes    after work-up - possible seizures?  . Thyroid disease    hypothyroid   Past Surgical History:  Procedure Laterality Date  . APPENDECTOMY    . CATARACT EXTRACTION W/PHACO Right 05/21/2019   Procedure: CATARACT EXTRACTION PHACO AND INTRAOCULAR LENS PLACEMENT ([place]) RIGHT panoptix lens  00:35.0  21.7%  7.61;  Surgeon: Leandrew Koyanagi, MD;  Location: [facility];  Service: Ophthalmology;  Laterality: Right;  sleep apnea requests early  . CATARACT EXTRACTION W/PHACO Left 06/11/2019   Procedure: CATARACT EXTRACTION PHACO AND INTRAOCULAR LENS PLACEMENT ([facility]) LEFT PANOPTIX TORIC LENS  00:50.0  20.3%  10.21;  Surgeon: Leandrew Koyanagi, MD;  Location: [facility];  Service: Ophthalmology;  Laterality: Left;  sleep apnea requests early  . COLONOSCOPY     last 2012 - [facility]  . ESOPHAGOGASTRODUODENOSCOPY     Multiple, last 01/07/2017 with Savary dilation to 18 mm  . HEMORRHOID BANDING  2018   [facility]  . LUMBAR DISC SURGERY  1984   L5    Social History   Tobacco Use  . Smoking status: Never Smoker  . Smokeless tobacco: Never Used  Vaping Use  . Vaping Use: Never used  Substance Use Topics  . Alcohol use: Yes    Alcohol/week: 1.0 standard drink     Types: 1 Glasses of wine per week    Comment: wine occasional  . Drug use: No   Family History  Problem Relation Age of Onset  . Diabetes Paternal Aunt   . Hypertension Maternal Grandmother   . Hypertension Maternal Grandfather    Allergies  Allergen Reactions  . Nsaids     GI discomfort and gastritis  . Tolmetin Other (See Comments)    GI discomfort and gastritis   Current Outpatient Medications on File Prior to Visit  Medication Sig Dispense Refill  . b complex vitamins tablet Take 1 tablet by mouth daily.    Marland Kitchen  Calcium Carb-Cholecalciferol (CALCIUM 600 + D PO) Take 1 tablet by mouth daily.    . diclofenac sodium (VOLTAREN) 1 % GEL APPLY 2 GRAMS TOPICALLY FOUR TIMES A DAY TO AFFECTED AREAS 300 g 3  . mometasone (NASONEX) 50 MCG/ACT nasal spray Place 2 sprays into the nose daily as needed.    . pseudoephedrine (SUDAFED) 60 MG tablet Take 60 mg by mouth every 4 (four) hours as needed for congestion.     No current facility-administered medications on file prior to visit.    Review of Systems  Constitutional: Negative for activity change, appetite change, fatigue, fever and unexpected weight change.  HENT: Negative for congestion, ear pain, rhinorrhea, sinus pressure and sore throat.   Eyes: Negative for pain, redness and visual disturbance.  Respiratory: Negative for cough, shortness of breath and wheezing.   Cardiovascular: Negative for chest pain and palpitations.  Gastrointestinal: Negative for abdominal distention, abdominal pain, anal bleeding, blood in stool, constipation, diarrhea, nausea, rectal pain and vomiting.       Upper gi symptoms come and go None today  Endocrine: Negative for polydipsia and polyuria.  Genitourinary: Negative for dysuria, frequency and urgency.  Musculoskeletal: Positive for back pain. Negative for arthralgias and myalgias.  Skin: Negative for pallor and rash.  Allergic/Immunologic: Negative for environmental allergies.  Neurological: Negative  for dizziness, syncope and headaches.  Hematological: Negative for adenopathy. Does not bruise/bleed easily.  Psychiatric/Behavioral: Negative for decreased concentration and dysphoric mood. The patient is not nervous/anxious.        Objective:   Physical Exam Constitutional:      General: She is not in acute distress.    Appearance: Normal appearance. She is well-developed and normal weight. She is not ill-appearing or diaphoretic.  HENT:     Head: Normocephalic and atraumatic.     Right Ear: Tympanic membrane, ear canal and external ear normal.     Left Ear: Tympanic membrane, ear canal and external ear normal.     Nose: Nose normal. No congestion.     Mouth/Throat:     Mouth: Mucous membranes are moist.     Pharynx: Oropharynx is clear. No posterior oropharyngeal erythema.  Eyes:     General: No scleral icterus.    Extraocular Movements: Extraocular movements intact.     Conjunctiva/sclera: Conjunctivae normal.     Pupils: Pupils are equal, round, and reactive to light.  Neck:     Thyroid: No thyromegaly.     Vascular: No carotid bruit or JVD.  Cardiovascular:     Rate and Rhythm: Normal rate and regular rhythm.     Pulses: Normal pulses.     Heart sounds: Normal heart sounds. No gallop.   Pulmonary:     Effort: Pulmonary effort is normal. No respiratory distress.     Breath sounds: Normal breath sounds. No wheezing.     Comments: Good air exch Chest:     Chest wall: No tenderness.  Abdominal:     General: Bowel sounds are normal. There is no distension or abdominal bruit.     Palpations: Abdomen is soft. There is no mass.     Tenderness: There is no abdominal tenderness.     Hernia: No hernia is present.  Genitourinary:    Comments: Breast exam: No mass, nodules, thickening, tenderness, bulging, retraction, inflamation, nipple discharge or skin changes noted.  No axillary or clavicular LA.     Musculoskeletal:        General: No tenderness. Normal range of motion.  Cervical back: Normal range of motion and neck supple. No rigidity. No muscular tenderness.     Right lower leg: No edema.     Left lower leg: No edema.     Comments: No kyphosis or acute joint changes  Lymphadenopathy:     Cervical: No cervical adenopathy.  Skin:    General: Skin is warm and dry.     Coloration: Skin is not pale.     Findings: No erythema or rash.     Comments: sks and lentigines diffusely   Neurological:     Mental Status: She is alert. Mental status is at baseline.     Cranial Nerves: No cranial nerve deficit.     Motor: No abnormal muscle tone.     Coordination: Coordination normal.     Gait: Gait normal.     Deep Tendon Reflexes: Reflexes are normal and symmetric. Reflexes normal.  Psychiatric:        Mood and Affect: Mood normal.        Cognition and Memory: Cognition and memory normal.           Assessment & Plan:   Problem List Items Addressed This Visit      Digestive   GERD    Pt continues f/u with dr Emelia Salisbury diet for acid Takes dexilant  Also prn sucralfate      Relevant Medications   dexlansoprazole (DEXILANT) 60 MG capsule   linaclotide (LINZESS) 145 MCG CAPS capsule   BARRETTS ESOPHAGUS    Continues f/u with Dr Carlean Purl        Endocrine   Hypothyroidism    Hypothyroidism  Pt has no clinical changes No change in energy level/ hair or skin/ edema and no tremor Lab Results  Component Value Date   TSH 1.58 03/05/2020          Relevant Medications   levothyroxine (SYNTHROID) 88 MCG tablet     Nervous and Auditory   Meniere's disease    Symptoms are mild nad intermittent She is very careful         Musculoskeletal and Integument   Osteopenia    Declines another dexa No falls or fractures  Not much exercise due to chronic back pain  Takes vit D        Other   Hyperlipidemia    Controlled with diet  Disc goals for lipids and reasons to control them Rev last labs with pt Rev low sat fat diet in detail  Good diet       Routine general medical examination at a health care facility - Primary    Reviewed health habits including diet and exercise and skin cancer prevention Reviewed appropriate screening tests for age  Also reviewed health mt list, fam hx and immunization status , as well as social and family history   See hPI Labs rev  amw upcoming  Pt will schedule mammogram- moving to [facility] for that Declines dexa  Exercise as tolerated Stable labs /cholesterol  utd cologuard for another year  covid immunized Considering shingrix       Encounter for screening mammogram for breast cancer    Nl breast exam  Wants to start getting mammograms at [facility] so ref done Given number to call and schedule herself      Relevant Orders   MM 3D SCREEN BREAST BILATERAL

## 2020-03-09 NOTE — Assessment & Plan Note (Signed)
Symptoms are mild nad intermittent She is very careful

## 2020-03-09 NOTE — Patient Instructions (Addendum)
Don't forget to schedule your mammogram -at [facility]   If you are interested in the new shingles vaccine (Shingrix) - call your local pharmacy to check on coverage and availability  If affordable, get on a wait list at your pharmacy to get the vaccine.  Take care of yourself  Stay as active as you can safely be

## 2020-03-09 NOTE — Assessment & Plan Note (Signed)
Continues f/u with Dr Carlean Purl

## 2020-03-09 NOTE — Assessment & Plan Note (Signed)
Reviewed health habits including diet and exercise and skin cancer prevention Reviewed appropriate screening tests for age  Also reviewed health mt list, fam hx and immunization status , as well as social and family history   See hPI Labs rev  amw upcoming  Pt will schedule mammogram- moving to [facility] for that Declines dexa  Exercise as tolerated Stable labs /cholesterol  utd cologuard for another year  covid immunized Considering shingrix

## 2020-03-09 NOTE — Assessment & Plan Note (Signed)
Controlled with diet  Disc goals for lipids and reasons to control them Rev last labs with pt Rev low sat fat diet in detail Good diet

## 2020-03-09 NOTE — Assessment & Plan Note (Signed)
Pt continues f/u with dr Emelia Salisbury diet for acid Takes dexilant  Also prn sucralfate

## 2020-03-10 ENCOUNTER — Other Ambulatory Visit: Payer: Self-pay | Admitting: Internal Medicine

## 2020-03-16 ENCOUNTER — Other Ambulatory Visit: Payer: Self-pay

## 2020-03-16 ENCOUNTER — Ambulatory Visit (HOSPITAL_COMMUNITY)
Admission: RE | Admit: 2020-03-16 | Discharge: 2020-03-16 | Disposition: A | Discharge status: Home / Self Care | Payer: Medicare HMO | Source: Ambulatory Visit | Attending: Cardiology | Admitting: Cardiology

## 2020-03-16 DIAGNOSIS — R600 Localized edema: Secondary | ICD-10-CM | POA: Insufficient documentation

## 2020-03-16 DIAGNOSIS — I872 Venous insufficiency (chronic) (peripheral): Secondary | ICD-10-CM

## 2020-03-19 ENCOUNTER — Ambulatory Visit (INDEPENDENT_AMBULATORY_CARE_PROVIDER_SITE_OTHER): Payer: Medicare HMO

## 2020-03-19 ENCOUNTER — Other Ambulatory Visit: Payer: Self-pay

## 2020-03-19 ENCOUNTER — Telehealth: Payer: Self-pay | Admitting: *Deleted

## 2020-03-19 DIAGNOSIS — Z0000 Encounter for general adult medical examination without abnormal findings: Secondary | ICD-10-CM | POA: Diagnosis not present

## 2020-03-19 NOTE — Progress Notes (Signed)
Subjective:   Sarah Harper is a 79 y.o. female who presents for Medicare Annual (Subsequent) preventive examination.  Review of Systems: N/A     I connected with the patient today by telephone and verified that I am speaking with the correct person using two identifiers. Location patient: home Location nurse: work Persons participating in the virtual visit: patient, Marine scientist.   I discussed the limitations, risks, security and privacy concerns of performing an evaluation and management service by telephone and the availability of in person appointments. I also discussed with the patient that there may be a patient responsible charge related to this service. The patient expressed understanding and verbally consented to this telephonic visit.    Interactive audio and video telecommunications were attempted between this nurse and patient, however failed, due to patient having technical difficulties OR patient did not have access to video capability.  We continued and completed visit with audio only.     Cardiac Risk Factors include: advanced age (>84men, >52 women);dyslipidemia     Objective:    Today's Vitals   03/19/20 1358  PainSc: 6    There is no height or weight on file to calculate BMI.  Advanced Directives 03/19/2020 06/11/2019 05/21/2019 12/11/2018 09/02/2015 06/28/2015  Does Patient Have a Medical Advance Directive? Yes Yes Yes No No;Yes Yes  Type of Paramedic of [place];Living will [place];Living will [place];Living will - [place];Living will;Advance instruction for mental health treatment;Mental [place];Living will;Advance instruction for mental health treatment;Mental Health Advance Directive  Does patient want to make changes to medical advance directive? - No - Patient declined No - Patient declined - - -  Copy of [place]  in Chart? No - copy requested No - copy requested Yes - validated most recent copy scanned in chart (See row information) - Yes Yes    Current Medications (verified) Outpatient Encounter Medications as of 03/19/2020  Medication Sig  . ALPRAZolam (XANAX) 0.25 MG tablet Take 1 tablet (0.25 mg total) by mouth 2 (two) times daily as needed for anxiety (with caution of sedation).  Marland Kitchen b complex vitamins tablet Take 1 tablet by mouth daily.  . butalbital-acetaminophen-caffeine (FIORICET) 50-325-40 MG tablet Take 1 tablet by mouth every 6 (six) hours as needed.  . Calcium Carb-Cholecalciferol (CALCIUM 600 + D PO) Take 1 tablet by mouth daily.  Marland Kitchen dexlansoprazole (DEXILANT) 60 MG capsule Take 1 capsule (60 mg total) by mouth daily.  . diclofenac sodium (VOLTAREN) 1 % GEL APPLY 2 GRAMS TOPICALLY FOUR TIMES A DAY TO AFFECTED AREAS  . estradiol (ESTRACE) 0.1 MG/GM vaginal cream Insert small amount (1 cm) in vagina every other day  . levothyroxine (SYNTHROID) 88 MCG tablet Take 1 tablet (88 mcg total) by mouth daily before breakfast.  . linaclotide (LINZESS) 145 MCG CAPS capsule Take 1 capsule (145 mcg total) by mouth daily.  . mometasone (NASONEX) 50 MCG/ACT nasal spray Place 2 sprays into the nose daily as needed.  . pseudoephedrine (SUDAFED) 60 MG tablet Take 60 mg by mouth every 4 (four) hours as needed for congestion.  . sucralfate (CARAFATE) 1 g tablet TAKE 1 TABLET (1 G TOTAL) BY MOUTH 4 (FOUR) TIMES DAILY - WITH MEALS AND AT BEDTIME.   No facility-administered encounter medications on file as of 03/19/2020.    Allergies (verified) Nsaids and Tolmetin   History: Past Medical History:  Diagnosis Date  . DDD (degenerative  disc disease)    chronic back pain  . ETD (eustachian tube dysfunction)   . GERD (gastroesophageal reflux disease)   . IBS (irritable bowel syndrome)   . Meniere's disease   . Migraine    still gets visual aura from time to time  . Osteoporosis   . Sleep apnea    CPAP  .  Syncopal episodes    after work-up - possible seizures?  . Thyroid disease    hypothyroid   Past Surgical History:  Procedure Laterality Date  . APPENDECTOMY    . CATARACT EXTRACTION W/PHACO Right 05/21/2019   Procedure: CATARACT EXTRACTION PHACO AND INTRAOCULAR LENS PLACEMENT ([place]) RIGHT panoptix lens  00:35.0  21.7%  7.61;  Surgeon: Leandrew Koyanagi, MD;  Location: [facility];  Service: Ophthalmology;  Laterality: Right;  sleep apnea requests early  . CATARACT EXTRACTION W/PHACO Left 06/11/2019   Procedure: CATARACT EXTRACTION PHACO AND INTRAOCULAR LENS PLACEMENT ([facility]) LEFT PANOPTIX TORIC LENS  00:50.0  20.3%  10.21;  Surgeon: Leandrew Koyanagi, MD;  Location: [facility];  Service: Ophthalmology;  Laterality: Left;  sleep apnea requests early  . COLONOSCOPY     last 2012 - [facility]  . ESOPHAGOGASTRODUODENOSCOPY     Multiple, last 01/07/2017 with Savary dilation to 18 mm  . HEMORRHOID BANDING  2018   [facility]  . LUMBAR DISC SURGERY  1984   L5    Family History  Problem Relation Age of Onset  . Diabetes Paternal Aunt   . Hypertension Maternal Grandmother   . Hypertension Maternal Grandfather    Social History   Socioeconomic History  . Marital status: Married    Spouse name: Erland  . Number of children: 0  . Years of education: Not on file  . Highest education level: Not on file  Occupational History  . Occupation: Retired  Tobacco Use  . Smoking status: Never Smoker  . Smokeless tobacco: Never Used  Vaping Use  . Vaping Use: Never used  Substance and Sexual Activity  . Alcohol use: Not Currently    Comment: wine occasional  . Drug use: No  . Sexual activity: Not on file  Other Topics Concern  . Not on file  Social History Narrative   Married, husband is a type I diabetic.  No children.   Elderly mother in her 75s lives in an assisted living facility.   Very occasional white wine with dinner, never smoker no drug use.  Limited caffeine.    Social Determinants of Health   Financial Resource Strain: Low Risk   . Difficulty of Paying Living Expenses: Not hard at all  Food Insecurity: No Food Insecurity  . Worried About Charity fundraiser in the Last Year: Never true  . Ran Out of Food in the Last Year: Never true  Transportation Needs: No Transportation Needs  . Lack of Transportation (Medical): No  . Lack of Transportation (Non-Medical): No  Physical Activity: Inactive  . Days of Exercise per Week: 0 days  . Minutes of Exercise per Session: 0 min  Stress: No Stress Concern Present  . Feeling of Stress : Not at all  Social Connections:   . Frequency of Communication with Friends and Family:   . Frequency of Social Gatherings with Friends and Family:   . Attends Religious Services:   . Active Member of Clubs or Organizations:   . Attends Archivist Meetings:   Marland Kitchen Marital Status:     Tobacco Counseling Counseling given: Not Answered  Clinical Intake:  Pre-visit preparation completed: Yes  Pain : 0-10 Pain Score: 6  Pain Type: Chronic pain Pain Location: Back Pain Orientation: Lower Pain Descriptors / Indicators: Aching Pain Onset: More than a month ago Pain Frequency: Intermittent     Nutritional Risks: None Diabetes: No  How often do you need to have someone help you when you read instructions, pamphlets, or other written materials from your doctor or pharmacy?: 1 - Never What is the last grade level you completed in school?: some college  Diabetic: No Nutrition Risk Assessment:  Has the patient had any N/V/D within the last 2 months?  No  Does the patient have any non-healing wounds?  No  Has the patient had any unintentional weight loss or weight gain?  No   Diabetes:  Is the patient diabetic?  No  If diabetic, was a CBG obtained today?  No  Did the patient bring in their glucometer from home?  No  How often do you monitor your CBG's? N/A.   Financial Strains and Diabetes  Management:  Are you having any financial strains with the device, your supplies or your medication? N/A.  Does the patient want to be seen by Chronic Care Management for management of their diabetes?  N/A Would the patient like to be referred to a Nutritionist or for Diabetic Management?  N/A     Interpreter Needed?: No  Information entered by :: CJohnson, LPN   Activities of Daily Living In your present state of health, do you have any difficulty performing the following activities: 03/19/2020 06/11/2019  Hearing? N N  Vision? N N  Difficulty concentrating or making decisions? Y N  Comment some trouble remembering things -  Walking or climbing stairs? N N  Dressing or bathing? N N  Doing errands, shopping? N -  Preparing Food and eating ? N -  Using the Toilet? N -  In the past six months, have you accidently leaked urine? N -  Do you have problems with loss of bowel control? N -  Managing your Medications? N -  Managing your Finances? N -  Housekeeping or managing your Housekeeping? N -  Some recent data might be hidden    Patient Care Team: [place], [name], MD as PCP - General Kate Sable, MD as PCP - Cardiology (Cardiology)  Indicate any recent Medical Services you may have received from other than [facility] providers in the past year (date may be approximate).     Assessment:   This is a routine wellness examination for Shatika.  Hearing/Vision screen  Hearing Screening   125Hz  250Hz  500Hz  1000Hz  2000Hz  3000Hz  4000Hz  6000Hz  8000Hz   Right ear:           Left ear:           Vision Screening Comments: Patient gets annual eye exams   Dietary issues and exercise activities discussed: Current Exercise Habits: Home exercise routine, Type of exercise: stretching, Time (Minutes): 15, Frequency (Times/Week): 7, Weekly Exercise (Minutes/Week): 105, Intensity: Mild, Exercise limited by: None identified  Goals    . Patient Stated     03/19/2020, I will continue to do  stretching exercises everyday for about 15 minutes.       Depression Screen PHQ 2/9 Scores 03/19/2020 03/07/2019 02/05/2018 03/06/2016 03/05/2015 02/24/2014 02/18/2013  PHQ - 2 Score 2 0 0 0 0 0 0  PHQ- 9 Score 2 - - - - - -    Fall Risk Fall Risk  03/19/2020 03/07/2019  02/05/2018 03/06/2016 03/05/2015  Falls in the past year? 1 0 No No No  Number falls in past yr: 0 0 - - -  Injury with Fall? 0 0 - - -  Risk for fall due to : Medication side effect - - - -  Follow up Falls evaluation completed;Falls prevention discussed - - - -    Any stairs in or around the home? Yes  If so, are there any without handrails? No  Home free of loose throw rugs in walkways, pet beds, electrical cords, etc? Yes  Adequate lighting in your home to reduce risk of falls? Yes   ASSISTIVE DEVICES UTILIZED TO PREVENT FALLS:  Life alert? No  Use of a cane, walker or w/c? No  Grab bars in the bathroom? No  Shower chair or bench in shower? No  Elevated toilet seat or a handicapped toilet? No   TIMED UP AND GO:  Was the test performed? N/A, telephonic visit .    Cognitive Function: MMSE - Mini Mental State Exam 03/19/2020  Orientation to time 5  Orientation to Place 5  Registration 3  Attention/ Calculation 5  Recall 3  Language- repeat 1       Mini Cog  Mini-Cog screen was completed. Maximum score is 22. A value of 0 denotes this part of the MMSE was not completed or the patient failed this part of the Mini-Cog screening.  Immunizations Immunization History  Administered Date(s) Administered  . Fluad Quad(high Dose 65+) 05/07/2019  . Influenza Whole 05/22/2007, 05/21/2009  . Influenza,inj,Quad PF,6+ Mos 06/11/2013, 05/28/2014, 05/14/2015, 05/12/2016, 05/10/2017, 05/16/2018  . PFIZER SARS-COV-2 Vaccination 09/12/2019, 10/03/2019  . Pneumococcal Conjugate-13 02/24/2014  . Pneumococcal Polysaccharide-23 12/22/2011  . Td 12/19/2001, 12/22/2011  . Zoster 11/12/2012    TDAP status: Up to date Flu  Vaccine status: Up to date Pneumococcal vaccine status: Up to date Covid-19 vaccine status: Completed vaccines  Qualifies for Shingles Vaccine? Yes   Zostavax completed Yes   Shingrix Completed?: No.    Education has been provided regarding the importance of this vaccine. Patient has been advised to call insurance company to determine out of pocket expense if they have not yet received this vaccine. Advised may also receive vaccine at local pharmacy or Health Dept. Verbalized acceptance and understanding.  Screening Tests Health Maintenance  Topic Date Due  . MAMMOGRAM  03/06/2029 (Originally 02/05/2019)  . INFLUENZA VACCINE  03/21/2020  . TETANUS/TDAP  12/21/2021  . DEXA SCAN  Completed  . COVID-19 Vaccine  Completed  . PNA vac Low Risk Adult  Completed  . Hepatitis C Screening  Discontinued    Health Maintenance  There are no preventive care reminders to display for this patient.  Colorectal cancer screening: No longer required.  Mammogram status: scheduled 03/26/2020 Bone Density status: declined  Lung Cancer Screening: (Low Dose CT Chest recommended if Age 3-80 years, 30 pack-year currently smoking OR have quit w/in 15years.) does not qualify.    Additional Screening:  Hepatitis C Screening: does not qualify; Completed N/A  Vision Screening: Recommended annual ophthalmology exams for early detection of glaucoma and other disorders of the eye. Is the patient up to date with their annual eye exam?  Yes  Who is the provider or what is the name of the office in which the patient attends annual eye exams? [facility] If pt is not established with a provider, would they like to be referred to a provider to establish care? No .   Dental  Screening: Recommended annual dental exams for proper oral hygiene  Community Resource Referral / Chronic Care Management: CRR required this visit?  No   CCM required this visit?  No      Plan:     I have personally reviewed and  noted the following in the patient's chart:   . Medical and social history . Use of alcohol, tobacco or illicit drugs  . Current medications and supplements . Functional ability and status . Nutritional status . Physical activity . Advanced directives . List of other physicians . Hospitalizations, surgeries, and ER visits in previous 12 months . Vitals . Screenings to include cognitive, depression, and falls . Referrals and appointments  In addition, I have reviewed and discussed with patient certain preventive protocols, quality metrics, and best practice recommendations. A written personalized care plan for preventive services as well as general preventive health recommendations were provided to patient.   Due to this being a telephonic visit, the after visit summary with patients personalized plan was offered to patient via mail or my-chart.  Patient preferred to pick up at office at next visit.   Andrez Grime, LPN   [id number]

## 2020-03-19 NOTE — Patient Instructions (Addendum)
Sarah Harper , Thank you for taking time to come for your Medicare Wellness Visit. I appreciate your ongoing commitment to your health goals. Please review the following plan we discussed and let me know if I can assist you in the future.   Screening recommendations/referrals: Colonoscopy: no longer required Mammogram: scheduled for 03/26/2020 Bone Density: declined Recommended yearly ophthalmology/optometry visit for glaucoma screening and checkup Recommended yearly dental visit for hygiene and checkup  Vaccinations: Influenza vaccine: Up to date, completed 05/07/2019, due 03/2020 Pneumococcal vaccine: Completed series Tdap vaccine: Up to date, completed 12/22/2011, due 12/2021 Shingles vaccine: due, check with your insurance regarding coverage   Covid-19: Completed series  Advanced directives: Please bring a copy of your POA (Power of Attorney) and/or Living Will to your next appointment.   Conditions/risks identified: hyperlipidemia  Next appointment: Follow up in one year for your annual wellness visit    Preventive Care 65 Years and Older, Female Preventive care refers to lifestyle choices and visits with your health care provider that can promote health and wellness. What does preventive care include?  A yearly physical exam. This is also called an annual well check.  Dental exams once or twice a year.  Routine eye exams. Ask your health care provider how often you should have your eyes checked.  Personal lifestyle choices, including:  Daily care of your teeth and gums.  Regular physical activity.  Eating a healthy diet.  Avoiding tobacco and drug use.  Limiting alcohol use.  Practicing safe sex.  Taking low-dose aspirin every day.  Taking vitamin and mineral supplements as recommended by your health care provider. What happens during an annual well check? The services and screenings done by your health care provider during your annual well check will depend on your  age, overall health, lifestyle risk factors, and family history of disease. Counseling  Your health care provider may ask you questions about your:  Alcohol use.  Tobacco use.  Drug use.  Emotional well-being.  Home and relationship well-being.  Sexual activity.  Eating habits.  History of falls.  Memory and ability to understand (cognition).  Work and work Statistician.  Reproductive health. Screening  You may have the following tests or measurements:  Height, weight, and BMI.  Blood pressure.  Lipid and cholesterol levels. These may be checked every 5 years, or more frequently if you are over 54 years old.  Skin check.  Lung cancer screening. You may have this screening every year starting at age 79 if you have a 30-pack-year history of smoking and currently smoke or have quit within the past 15 years.  Fecal occult blood test (FOBT) of the stool. You may have this test every year starting at age 79.  Flexible sigmoidoscopy or colonoscopy. You may have a sigmoidoscopy every 5 years or a colonoscopy every 10 years starting at age 79.  Hepatitis C blood test.  Hepatitis B blood test.  Sexually transmitted disease (STD) testing.  Diabetes screening. This is done by checking your blood sugar (glucose) after you have not eaten for a while (fasting). You may have this done every 1-3 years.  Bone density scan. This is done to screen for osteoporosis. You may have this done starting at age 79.  Mammogram. This may be done every 1-2 years. Talk to your health care provider about how often you should have regular mammograms. Talk with your health care provider about your test results, treatment options, and if necessary, the need for more tests. Vaccines  Your health care provider may recommend certain vaccines, such as:  Influenza vaccine. This is recommended every year.  Tetanus, diphtheria, and acellular pertussis (Tdap, Td) vaccine. You may need a Td booster  every 10 years.  Zoster vaccine. You may need this after age 31.  Pneumococcal 13-valent conjugate (PCV13) vaccine. One dose is recommended after age 47.  Pneumococcal polysaccharide (PPSV23) vaccine. One dose is recommended after age 60. Talk to your health care provider about which screenings and vaccines you need and how often you need them. This information is not intended to replace advice given to you by your health care provider. Make sure you discuss any questions you have with your health care provider. Document Released: 09/03/2015 Document Revised: 04/26/2016 Document Reviewed: 06/08/2015 Elsevier Interactive Patient Education  2017 [place] Prevention in the Home Falls can cause injuries. They can happen to people of all ages. There are many things you can do to make your home safe and to help prevent falls. What can I do on the outside of my home?  Regularly fix the edges of walkways and driveways and fix any cracks.  Remove anything that might make you trip as you walk through a door, such as a raised step or threshold.  Trim any bushes or trees on the path to your home.  Use bright outdoor lighting.  Clear any walking paths of anything that might make someone trip, such as rocks or tools.  Regularly check to see if handrails are loose or broken. Make sure that both sides of any steps have handrails.  Any raised decks and porches should have guardrails on the edges.  Have any leaves, snow, or ice cleared regularly.  Use sand or salt on walking paths during winter.  Clean up any spills in your garage right away. This includes oil or grease spills. What can I do in the bathroom?  Use night lights.  Install grab bars by the toilet and in the tub and shower. Do not use towel bars as grab bars.  Use non-skid mats or decals in the tub or shower.  If you need to sit down in the shower, use a plastic, non-slip stool.  Keep the floor dry. Clean up any  water that spills on the floor as soon as it happens.  Remove soap buildup in the tub or shower regularly.  Attach bath mats securely with double-sided non-slip rug tape.  Do not have throw rugs and other things on the floor that can make you trip. What can I do in the bedroom?  Use night lights.  Make sure that you have a light by your bed that is easy to reach.  Do not use any sheets or blankets that are too big for your bed. They should not hang down onto the floor.  Have a firm chair that has side arms. You can use this for support while you get dressed.  Do not have throw rugs and other things on the floor that can make you trip. What can I do in the kitchen?  Clean up any spills right away.  Avoid walking on wet floors.  Keep items that you use a lot in easy-to-reach places.  If you need to reach something above you, use a strong step stool that has a grab bar.  Keep electrical cords out of the way.  Do not use floor polish or wax that makes floors slippery. If you must use wax, use non-skid floor wax.  Do  not have throw rugs and other things on the floor that can make you trip. What can I do with my stairs?  Do not leave any items on the stairs.  Make sure that there are handrails on both sides of the stairs and use them. Fix handrails that are broken or loose. Make sure that handrails are as long as the stairways.  Check any carpeting to make sure that it is firmly attached to the stairs. Fix any carpet that is loose or worn.  Avoid having throw rugs at the top or bottom of the stairs. If you do have throw rugs, attach them to the floor with carpet tape.  Make sure that you have a light switch at the top of the stairs and the bottom of the stairs. If you do not have them, ask someone to add them for you. What else can I do to help prevent falls?  Wear shoes that:  Do not have high heels.  Have rubber bottoms.  Are comfortable and fit you well.  Are closed  at the toe. Do not wear sandals.  If you use a stepladder:  Make sure that it is fully opened. Do not climb a closed stepladder.  Make sure that both sides of the stepladder are locked into place.  Ask someone to hold it for you, if possible.  Clearly mark and make sure that you can see:  Any grab bars or handrails.  First and last steps.  Where the edge of each step is.  Use tools that help you move around (mobility aids) if they are needed. These include:  Canes.  Walkers.  Scooters.  Crutches.  Turn on the lights when you go into a dark area. Replace any light bulbs as soon as they burn out.  Set up your furniture so you have a clear path. Avoid moving your furniture around.  If any of your floors are uneven, fix them.  If there are any pets around you, be aware of where they are.  Review your medicines with your doctor. Some medicines can make you feel dizzy. This can increase your chance of falling. Ask your doctor what other things that you can do to help prevent falls. This information is not intended to replace advice given to you by your health care provider. Make sure you discuss any questions you have with your health care provider. Document Released: 06/03/2009 Document Revised: 01/13/2016 Document Reviewed: 09/11/2014 Elsevier Interactive Patient Education  2017 [employer].

## 2020-03-19 NOTE — Telephone Encounter (Signed)
-----   Message from Garrel Ridgel, [place] sent at 03/16/2020  5:16 PM EDT ----- Follow up with vascular for recommendations for the venous reflux

## 2020-03-19 NOTE — Progress Notes (Signed)
PCP notes:  Health Maintenance: Dexa- declined   Abnormal Screenings: none   Patient concerns: none   Nurse concerns: none   Next PCP appt: 03/11/2021 @ 9:30 am

## 2020-03-19 NOTE — Telephone Encounter (Signed)
Patient has a virtual visit follow up with Kate Sable on 03/22/20

## 2020-03-22 ENCOUNTER — Telehealth (INDEPENDENT_AMBULATORY_CARE_PROVIDER_SITE_OTHER): Payer: Medicare HMO | Admitting: Cardiology

## 2020-03-22 ENCOUNTER — Encounter: Payer: Self-pay | Admitting: Cardiology

## 2020-03-22 ENCOUNTER — Other Ambulatory Visit: Payer: Self-pay

## 2020-03-22 VITALS — Ht 65.0 in | Wt 145.0 lb

## 2020-03-22 DIAGNOSIS — R600 Localized edema: Secondary | ICD-10-CM | POA: Diagnosis not present

## 2020-03-22 DIAGNOSIS — I872 Venous insufficiency (chronic) (peripheral): Secondary | ICD-10-CM

## 2020-03-22 NOTE — Progress Notes (Signed)
Virtual Visit via Telephone Note   This visit type was conducted due to national recommendations for restrictions regarding the COVID-19 Pandemic (e.g. social distancing) in an effort to limit this patient's exposure and mitigate transmission in our community.  Due to her co-morbid illnesses, this patient is at least at moderate risk for complications without adequate follow up.  This format is felt to be most appropriate for this patient at this time.  The patient did not have access to video technology/had technical difficulties with video requiring transitioning to audio format only (telephone).  All issues noted in this document were discussed and addressed.  No physical exam could be performed with this format.  Please refer to the patient's chart for her  consent to telehealth for [facility].   Date:  03/22/2020   ID:  Sarah Harper, DOB Aug 12, 1941, MRN [id number]  Patient Location: Home Provider Location: Office/Clinic  PCP:  Abner Greenspan, MD  Cardiologist:  Kate Sable, MD  Electrophysiologist:  None   Evaluation Performed:  Follow-Up Visit  Chief Complaint:  Follow up for edema  History of Present Illness:    Sarah Harper is a 79 y.o. female with history of degenerative joint disease, GERD who is being evaluated for follow-up.  Patient last seen due to lower extremity edema that worsens as the day progresses.  Symptoms were consistent with venous insufficiency.  Lower extremity venous ultrasound with reflux was ordered to evaluate for venous insufficiency.  Patient states doing okay since last visit.  Recently saw her for doctor Who prescribed her prednisone to help with right ankle swelling.  Compression stockings also help with her symptoms.  Prior echocardiogram showed normal systolic and diastolic function.  The patient does not have symptoms concerning for COVID-19 infection (fever, chills, cough, or new shortness of breath).    Past Medical History:    Diagnosis Date  . DDD (degenerative disc disease)    chronic back pain  . ETD (eustachian tube dysfunction)   . GERD (gastroesophageal reflux disease)   . IBS (irritable bowel syndrome)   . Meniere's disease   . Migraine    still gets visual aura from time to time  . Osteoporosis   . Sleep apnea    CPAP  . Syncopal episodes    after work-up - possible seizures?  . Thyroid disease    hypothyroid   Past Surgical History:  Procedure Laterality Date  . APPENDECTOMY    . CATARACT EXTRACTION W/PHACO Right 05/21/2019   Procedure: CATARACT EXTRACTION PHACO AND INTRAOCULAR LENS PLACEMENT ([place]) RIGHT panoptix lens  00:35.0  21.7%  7.61;  Surgeon: Leandrew Koyanagi, MD;  Location: [place];  Service: Ophthalmology;  Laterality: Right;  sleep apnea requests early  . CATARACT EXTRACTION W/PHACO Left 06/11/2019   Procedure: CATARACT EXTRACTION PHACO AND INTRAOCULAR LENS PLACEMENT ([facility]) LEFT PANOPTIX TORIC LENS  00:50.0  20.3%  10.21;  Surgeon: Leandrew Koyanagi, MD;  Location: [place];  Service: Ophthalmology;  Laterality: Left;  sleep apnea requests early  . COLONOSCOPY     last 2012 - [facility]  . ESOPHAGOGASTRODUODENOSCOPY     Multiple, last 01/07/2017 with Savary dilation to 18 mm  . HEMORRHOID BANDING  2018   [facility]  . LUMBAR DISC SURGERY  1984   L5      Current Meds  Medication Sig  . ALPRAZolam (XANAX) 0.25 MG tablet Take 1 tablet (0.25 mg total) by mouth 2 (two) times daily as needed for anxiety (with caution  of sedation).  Marland Kitchen b complex vitamins tablet Take 1 tablet by mouth daily.  . butalbital-acetaminophen-caffeine (FIORICET) 50-325-40 MG tablet Take 1 tablet by mouth every 6 (six) hours as needed.  . Calcium Carb-Cholecalciferol (CALCIUM 600 + D PO) Take 1 tablet by mouth daily.  Marland Kitchen dexlansoprazole (DEXILANT) 60 MG capsule Take 1 capsule (60 mg total) by mouth daily.  . diclofenac sodium (VOLTAREN) 1 % GEL APPLY 2 GRAMS TOPICALLY FOUR TIMES A DAY TO  AFFECTED AREAS  . estradiol (ESTRACE) 0.1 MG/GM vaginal cream Insert small amount (1 cm) in vagina every other day  . levothyroxine (SYNTHROID) 88 MCG tablet Take 1 tablet (88 mcg total) by mouth daily before breakfast.  . linaclotide (LINZESS) 145 MCG CAPS capsule Take 1 capsule (145 mcg total) by mouth daily.  . mometasone (NASONEX) 50 MCG/ACT nasal spray Place 2 sprays into the nose daily as needed.  . pseudoephedrine (SUDAFED) 60 MG tablet Take 60 mg by mouth every 4 (four) hours as needed for congestion.  . sucralfate (CARAFATE) 1 g tablet TAKE 1 TABLET (1 G TOTAL) BY MOUTH 4 (FOUR) TIMES DAILY - WITH MEALS AND AT BEDTIME.     Allergies:   Nsaids and Tolmetin   Social History   Tobacco Use  . Smoking status: Never Smoker  . Smokeless tobacco: Never Used  Vaping Use  . Vaping Use: Never used  Substance Use Topics  . Alcohol use: Not Currently    Comment: wine occasional  . Drug use: No     Family Hx: The patient's family history includes Diabetes in her paternal aunt; Hypertension in her maternal grandfather and maternal grandmother.  ROS:   Please see the history of present illness.     All other systems reviewed and are negative.   Prior CV studies:   The following studies were reviewed today:  Ultrasound lower extremity venous 03/16/2020 Summary:  Right:  - No evidence of deep vein thrombosis seen in the right lower extremity,  from the common femoral through the popliteal veins.  - No evidence of superficial venous thrombosis in the right lower  extremity.  - Venous reflux is noted in the right femoral vein.    *See table(s) above for measurements and observations.   Labs/Other Tests and Data Reviewed:    EKG:  No ECG reviewed.  Recent Labs: 03/05/2020: ALT 17; BUN 17; Creatinine, Ser 0.71; Hemoglobin 13.5; Platelets 251.0; Potassium 4.4; Sodium 136; TSH 1.58   Recent Lipid Panel Lab Results  Component Value Date/Time   CHOL 197 03/05/2020 07:40 AM    TRIG 68.0 03/05/2020 07:40 AM   HDL 81.70 03/05/2020 07:40 AM   CHOLHDL 2 03/05/2020 07:40 AM   LDLCALC 101 (H) 03/05/2020 07:40 AM   LDLDIRECT 115.5 02/12/2013 07:55 AM    Wt Readings from Last 3 Encounters:  03/22/20 145 lb (65.8 kg)  03/09/20 133 lb 7 oz (60.5 kg)  02/06/20 136 lb 4 oz (61.8 kg)     Objective:    Vital Signs:  Ht 5\' 5"  (1.651 m)   Wt 145 lb (65.8 kg)   BMI 24.13 kg/m    VITAL SIGNS:  reviewed  ASSESSMENT & PLAN:    1. Venous insufficiency, extremity edema.  Patient with history of lower extremity edema, findings and history consistent with venous insufficiency.  Lower extremity venous ultrasound did reveal venous reflux in the right femoral vein.  Will refer patient to vein clinic for further management.  Conservative measures such as keeping legs raised  while in a seated position and also compression stockings advised.  COVID-19 Education: The signs and symptoms of COVID-19 were discussed with the patient and how to seek care for testing (follow up with PCP or arrange E-visit).  The importance of social distancing was discussed today.  Time:   Today, I have spent 35 minutes with the patient with telehealth technology discussing the above problems.     Medication Adjustments/Labs and Tests Ordered: Current medicines are reviewed at length with the patient today.  Concerns regarding medicines are outlined above.   Tests Ordered: Orders Placed This Encounter  Procedures  . Ambulatory referral to Vascular Surgery    Medication Changes: No orders of the defined types were placed in this encounter.   Follow Up:  In Person in 1 year(s)  Signed, Kate Sable, MD  03/22/2020 3:31 PM    [place]

## 2020-03-22 NOTE — Patient Instructions (Addendum)
Medication Instructions:  Your physician recommends that you continue on your current medications as directed. Please refer to the Current Medication list given to you today.  *If you need a refill on your cardiac medications before your next appointment, please call your pharmacy*   Lab Work: None Ordered If you have labs (blood work) drawn today and your tests are completely normal, you will receive your results only by: Marland Kitchen MyChart Message (if you have MyChart) OR . A paper copy in the mail If you have any lab test that is abnormal or we need to change your treatment, we will call you to review the results.   Testing/Procedures: None Ordered    Follow-Up: At [facility], you and your health needs are our priority.  As part of our continuing mission to provide you with exceptional heart care, we have created designated Provider Care Teams.  These Care Teams include your primary Cardiologist (physician) and Advanced Practice Providers (APPs -  Physician Assistants and Nurse Practitioners) who all work together to provide you with the care you need, when you need it.  We recommend signing up for the patient portal called "MyChart".  Sign up information is provided on this After Visit Summary.  MyChart is used to connect with patients for Virtual Visits (Telemedicine).  Patients are able to view lab/test results, encounter notes, upcoming appointments, etc.  Non-urgent messages can be sent to your provider as well.   To learn more about what you can do with MyChart, go to [email or web address].    Your next appointment:   Your physician wants you to follow-up in: 1 year. You will receive a reminder letter in the mail two months in advance. If you don't receive a letter, please call our office to schedule the follow-up appointment.   The format for your next appointment:   In Person  Provider:   Kate Sable, MD   Other Instructions  Referral to Vein and Vascular. They will  Call you to schedule.

## 2020-03-26 ENCOUNTER — Other Ambulatory Visit: Payer: Self-pay

## 2020-03-26 ENCOUNTER — Ambulatory Visit
Admission: RE | Admit: 2020-03-26 | Discharge: 2020-03-26 | Disposition: A | Discharge status: Home / Self Care | Payer: Medicare HMO | Source: Ambulatory Visit | Attending: Family Medicine | Admitting: Family Medicine

## 2020-03-26 DIAGNOSIS — Z1231 Encounter for screening mammogram for malignant neoplasm of breast: Secondary | ICD-10-CM

## 2020-03-30 ENCOUNTER — Ambulatory Visit (INDEPENDENT_AMBULATORY_CARE_PROVIDER_SITE_OTHER): Payer: Medicare HMO | Admitting: Nurse Practitioner

## 2020-03-30 ENCOUNTER — Other Ambulatory Visit: Payer: Self-pay

## 2020-03-30 ENCOUNTER — Encounter (INDEPENDENT_AMBULATORY_CARE_PROVIDER_SITE_OTHER): Payer: Self-pay | Admitting: Nurse Practitioner

## 2020-03-30 VITALS — BP 127/78 | HR 71 | Resp 16 | Ht 65.5 in | Wt 136.0 lb

## 2020-03-30 DIAGNOSIS — M47816 Spondylosis without myelopathy or radiculopathy, lumbar region: Secondary | ICD-10-CM

## 2020-03-30 DIAGNOSIS — I872 Venous insufficiency (chronic) (peripheral): Secondary | ICD-10-CM | POA: Diagnosis not present

## 2020-03-30 NOTE — Progress Notes (Signed)
Subjective:    Patient ID: Sarah Harper, female    DOB: September 13, 1940, 79 y.o.   MRN: [id number] Chief Complaint  Patient presents with  . New Patient (Initial Visit)    ref Abgor-etan venous insuffciency    The patient presents today as a referral from Dr. Garen Lah in regards to right lower extremity edema.  The patient notes that she has swelling of her right ankle area that progresses as the day goes on.  She denies any ulcerations.  This is a relatively new development.  The patient had a right lower extremity venous reflux study done which revealed evidence of reflux in the femoral vein of the right lower extremity.  There is no evidence of superficial venous reflux seen in the right lower extremity.  No DVT or superficial thrombophlebitis seen.  The patient does have a history of degenerative disc disease which makes it difficult for her to elevate her lower extremities.  She denies any ulceration or wound development.   Review of Systems  Cardiovascular: Positive for leg swelling.  All other systems reviewed and are negative.      Objective:   Physical Exam Vitals reviewed.  HENT:     Head: Normocephalic.  Cardiovascular:     Rate and Rhythm: Normal rate.     Pulses: Normal pulses.  Pulmonary:     Effort: Pulmonary effort is normal.  Musculoskeletal:     Right lower leg: Edema present.  Skin:    Capillary Refill: Capillary refill takes less than 2 seconds.  Neurological:     Mental Status: She is alert and oriented to person, place, and time.  Psychiatric:        Mood and Affect: Mood normal.        Behavior: Behavior normal.        Thought Content: Thought content normal.        Judgment: Judgment normal.     BP 127/78 (BP Location: Right Arm)   Pulse 71   Resp 16   Ht 5' 5.5" (1.664 m)   Wt 136 lb (61.7 kg)   BMI 22.29 kg/m   Past Medical History:  Diagnosis Date  . DDD (degenerative disc disease)    chronic back pain  . ETD (eustachian tube  dysfunction)   . GERD (gastroesophageal reflux disease)   . IBS (irritable bowel syndrome)   . Meniere's disease   . Migraine    still gets visual aura from time to time  . Osteoporosis   . Sleep apnea    CPAP  . Syncopal episodes    after work-up - possible seizures?  . Thyroid disease    hypothyroid    Social History   Socioeconomic History  . Marital status: Married    Spouse name: Erland  . Number of children: 0  . Years of education: Not on file  . Highest education level: Not on file  Occupational History  . Occupation: Retired  Tobacco Use  . Smoking status: Never Smoker  . Smokeless tobacco: Never Used  Vaping Use  . Vaping Use: Never used  Substance and Sexual Activity  . Alcohol use: Not Currently    Comment: wine occasional  . Drug use: No  . Sexual activity: Not on file  Other Topics Concern  . Not on file  Social History Narrative   Married, husband is a type I diabetic.  No children.   Elderly mother in her 31s lives in an assisted living facility.  Very occasional white wine with dinner, never smoker no drug use.  Limited caffeine.   Social Determinants of Health   Financial Resource Strain: Low Risk   . Difficulty of Paying Living Expenses: Not hard at all  Food Insecurity: No Food Insecurity  . Worried About Charity fundraiser in the Last Year: Never true  . Ran Out of Food in the Last Year: Never true  Transportation Needs: No Transportation Needs  . Lack of Transportation (Medical): No  . Lack of Transportation (Non-Medical): No  Physical Activity: Inactive  . Days of Exercise per Week: 0 days  . Minutes of Exercise per Session: 0 min  Stress: No Stress Concern Present  . Feeling of Stress : Not at all  Social Connections:   . Frequency of Communication with Friends and Family:   . Frequency of Social Gatherings with Friends and Family:   . Attends Religious Services:   . Active Member of Clubs or Organizations:   . Attends Theatre manager Meetings:   Marland Kitchen Marital Status:   Intimate Partner Violence: Not At Risk  . Fear of Current or Ex-Partner: No  . Emotionally Abused: No  . Physically Abused: No  . Sexually Abused: No    Past Surgical History:  Procedure Laterality Date  . APPENDECTOMY    . CATARACT EXTRACTION W/PHACO Right 05/21/2019   Procedure: CATARACT EXTRACTION PHACO AND INTRAOCULAR LENS PLACEMENT ([place]) RIGHT panoptix lens  00:35.0  21.7%  7.61;  Surgeon: Leandrew Koyanagi, MD;  Location: [place];  Service: Ophthalmology;  Laterality: Right;  sleep apnea requests early  . CATARACT EXTRACTION W/PHACO Left 06/11/2019   Procedure: CATARACT EXTRACTION PHACO AND INTRAOCULAR LENS PLACEMENT ([facility]) LEFT PANOPTIX TORIC LENS  00:50.0  20.3%  10.21;  Surgeon: Leandrew Koyanagi, MD;  Location: [place];  Service: Ophthalmology;  Laterality: Left;  sleep apnea requests early  . COLONOSCOPY     last 2012 - [facility]  . ESOPHAGOGASTRODUODENOSCOPY     Multiple, last 01/07/2017 with Savary dilation to 18 mm  . HEMORRHOID BANDING  2018   [facility]  . LUMBAR DISC SURGERY  1984   L5     Family History  Problem Relation Age of Onset  . Diabetes Paternal Aunt   . Breast cancer Paternal Aunt   . Hypertension Maternal Grandmother   . Hypertension Maternal Grandfather     Allergies  Allergen Reactions  . Nsaids     GI discomfort and gastritis  . Tolmetin Other (See Comments)    GI discomfort and gastritis       Assessment & Plan:   1. Chronic venous insufficiency No surgery or intervention at this point in time.    I have had a long discussion with the patient regarding venous insufficiency and why it  causes symptoms.  Patient will begin wearing graduated compression stockings class 1 (20-30 mmHg) or compression wraps on a daily basis a prescription was given. The patient will put the stockings on first thing in the morning and removing them in the evening. The patient is instructed  specifically not to sleep in the stockings.    In addition, behavioral modification including several periods of elevation of the lower extremities during the day will be continued. I have demonstrated that proper elevation is a position with the ankles at heart level.  The patient is instructed to begin routine exercise, especially walking on a daily basis  Following the review of the ultrasound the patient will follow up  in 6 months to reassess the degree of swelling and the control that graduated compression stockings or compression wraps  is offering.   The patient can be assessed for a Lymph Pump at that time  2. Osteoarthritis of lumbar spine, unspecified spinal osteoarthritis complication status Continue NSAID medications as already ordered, these medications have been reviewed and there are no changes at this time.  The patient may have some difficulty with elevation due to osteoarthritis.    Current Outpatient Medications on File Prior to Visit  Medication Sig Dispense Refill  . ALPRAZolam (XANAX) 0.25 MG tablet Take 1 tablet (0.25 mg total) by mouth 2 (two) times daily as needed for anxiety (with caution of sedation). 30 tablet 0  . b complex vitamins tablet Take 1 tablet by mouth daily.    . butalbital-acetaminophen-caffeine (FIORICET) 50-325-40 MG tablet Take 1 tablet by mouth every 6 (six) hours as needed. 60 tablet 0  . Calcium Carb-Cholecalciferol (CALCIUM 600 + D PO) Take 1 tablet by mouth daily.    Marland Kitchen dexlansoprazole (DEXILANT) 60 MG capsule Take 1 capsule (60 mg total) by mouth daily. 90 capsule 3  . diclofenac sodium (VOLTAREN) 1 % GEL APPLY 2 GRAMS TOPICALLY FOUR TIMES A DAY TO AFFECTED AREAS 300 g 3  . estradiol (ESTRACE) 0.1 MG/GM vaginal cream Insert small amount (1 cm) in vagina every other day 42.5 g 3  . levothyroxine (SYNTHROID) 88 MCG tablet Take 1 tablet (88 mcg total) by mouth daily before breakfast. 90 tablet 3  . linaclotide (LINZESS) 145 MCG CAPS capsule Take  1 capsule (145 mcg total) by mouth daily. 90 capsule 3  . mometasone (NASONEX) 50 MCG/ACT nasal spray Place 2 sprays into the nose daily as needed.    . pseudoephedrine (SUDAFED) 60 MG tablet Take 60 mg by mouth every 4 (four) hours as needed for congestion.    . sucralfate (CARAFATE) 1 g tablet TAKE 1 TABLET (1 G TOTAL) BY MOUTH 4 (FOUR) TIMES DAILY - WITH MEALS AND AT BEDTIME. 120 tablet 2   No current facility-administered medications on file prior to visit.    There are no Patient Instructions on file for this visit. No follow-ups on file.   Kris Hartmann, NP

## 2020-05-11 ENCOUNTER — Ambulatory Visit: Payer: Self-pay

## 2020-05-22 ENCOUNTER — Ambulatory Visit (INDEPENDENT_AMBULATORY_CARE_PROVIDER_SITE_OTHER): Payer: Medicare HMO

## 2020-05-22 ENCOUNTER — Other Ambulatory Visit: Payer: Self-pay

## 2020-05-22 DIAGNOSIS — Z23 Encounter for immunization: Secondary | ICD-10-CM | POA: Diagnosis not present

## 2020-08-09 ENCOUNTER — Encounter: Payer: Self-pay | Admitting: Podiatry

## 2020-08-09 ENCOUNTER — Ambulatory Visit: Payer: Medicare HMO | Admitting: Podiatry

## 2020-08-09 ENCOUNTER — Other Ambulatory Visit: Payer: Self-pay

## 2020-08-09 DIAGNOSIS — D2371 Other benign neoplasm of skin of right lower limb, including hip: Secondary | ICD-10-CM | POA: Diagnosis not present

## 2020-08-09 DIAGNOSIS — G5793 Unspecified mononeuropathy of bilateral lower limbs: Secondary | ICD-10-CM

## 2020-08-09 DIAGNOSIS — M7752 Other enthesopathy of left foot: Secondary | ICD-10-CM | POA: Diagnosis not present

## 2020-08-09 MED ORDER — GABAPENTIN 100 MG PO CAPS
100.0000 mg | ORAL_CAPSULE | Freq: Every day | ORAL | 3 refills | Status: DC
Start: 2020-08-09 — End: 2020-11-09

## 2020-08-09 NOTE — Progress Notes (Signed)
She presents today for a follow-up of her capsulitis and plantar fasciitis. States that the shots did not do anything last time. She states that about 3:00 in the morning my feet to start to burn like crazy. She refers primarily to the right foot over the left.  Objective: Vital signs are stable she is alert and oriented x3. Pulses are palpable. She has no reproducible pain on palpation though she does have reactive hyper keratomas or a benign skin lesion to the plantar aspect of the fifth metatarsals bilaterally.  Assessment: Idiopathic neuropathy right side with a history of back pain. Porokeratosis benign skin lesion.  Plan: Debrided benign skin lesion. Started her on 100 mg nightly gabapentin. Discussed possible side effects with her. I will follow-up with her in 1 month to discuss increasing if necessary.

## 2020-09-15 ENCOUNTER — Encounter: Payer: Self-pay | Admitting: Podiatry

## 2020-09-15 ENCOUNTER — Other Ambulatory Visit: Payer: Self-pay

## 2020-09-15 ENCOUNTER — Ambulatory Visit: Payer: Medicare HMO | Admitting: Podiatry

## 2020-09-15 DIAGNOSIS — M79676 Pain in unspecified toe(s): Secondary | ICD-10-CM | POA: Diagnosis not present

## 2020-09-15 DIAGNOSIS — B351 Tinea unguium: Secondary | ICD-10-CM

## 2020-09-15 NOTE — Progress Notes (Signed)
She presents today chief complaint of painfully elongated toenails 1 through 5 bilaterally sharp incurvated nail margins to the hallux and second digit of the left foot primarily.  Objective: Vital signs are stable alert oriented x3 there is no erythema edema cellulitis drainage odor toenails are sharp and incurvated along the tibiofibular border of the hallux left and tibial border of the second digit left foot.  Otherwise the remainder of the nails are mildly thickened and tender.  Assessment: Pain in secondary to onychomycosis and sharp innervated nail margins.  Plan: Debridement of toenails 1 through 5 bilateral.

## 2020-11-08 ENCOUNTER — Telehealth: Payer: Self-pay

## 2020-11-08 ENCOUNTER — Observation Stay
Admission: EM | Admit: 2020-11-08 | Discharge: 2020-11-09 | Disposition: A | Discharge status: Home / Self Care | Payer: Medicare HMO | Attending: Internal Medicine | Admitting: Internal Medicine

## 2020-11-08 ENCOUNTER — Emergency Department: Payer: Medicare HMO

## 2020-11-08 ENCOUNTER — Other Ambulatory Visit: Payer: Self-pay

## 2020-11-08 ENCOUNTER — Encounter: Payer: Self-pay | Admitting: Emergency Medicine

## 2020-11-08 ENCOUNTER — Inpatient Hospital Stay: Payer: Medicare HMO

## 2020-11-08 DIAGNOSIS — Z8673 Personal history of transient ischemic attack (TIA), and cerebral infarction without residual deficits: Secondary | ICD-10-CM | POA: Diagnosis present

## 2020-11-08 DIAGNOSIS — I639 Cerebral infarction, unspecified: Principal | ICD-10-CM | POA: Insufficient documentation

## 2020-11-08 DIAGNOSIS — G459 Transient cerebral ischemic attack, unspecified: Secondary | ICD-10-CM | POA: Diagnosis not present

## 2020-11-08 DIAGNOSIS — E039 Hypothyroidism, unspecified: Secondary | ICD-10-CM | POA: Insufficient documentation

## 2020-11-08 DIAGNOSIS — R2990 Unspecified symptoms and signs involving the nervous system: Secondary | ICD-10-CM

## 2020-11-08 DIAGNOSIS — R4182 Altered mental status, unspecified: Secondary | ICD-10-CM | POA: Diagnosis present

## 2020-11-08 DIAGNOSIS — Z79899 Other long term (current) drug therapy: Secondary | ICD-10-CM | POA: Insufficient documentation

## 2020-11-08 DIAGNOSIS — R002 Palpitations: Secondary | ICD-10-CM

## 2020-11-08 DIAGNOSIS — Z20822 Contact with and (suspected) exposure to covid-19: Secondary | ICD-10-CM | POA: Insufficient documentation

## 2020-11-08 LAB — COMPREHENSIVE METABOLIC PANEL
ALT: 17 U/L (ref 0–44)
AST: 22 U/L (ref 15–41)
Albumin: 4 g/dL (ref 3.5–5.0)
Alkaline Phosphatase: 73 U/L (ref 38–126)
Anion gap: 6 (ref 5–15)
BUN: 17 mg/dL (ref 8–23)
CO2: 27 mmol/L (ref 22–32)
Calcium: 8.8 mg/dL — ABNORMAL LOW (ref 8.9–10.3)
Chloride: 99 mmol/L (ref 98–111)
Creatinine, Ser: 0.65 mg/dL (ref 0.44–1.00)
GFR, Estimated: >60 mL/min (ref >60)
Glucose, Bld: 109 mg/dL — ABNORMAL HIGH (ref 70–99)
Potassium: 3.9 mmol/L (ref 3.5–5.1)
Sodium: 132 mmol/L — ABNORMAL LOW (ref 135–145)
Total Bilirubin: 0.6 mg/dL (ref 0.3–1.2)
Total Protein: 6.3 g/dL — ABNORMAL LOW (ref 6.5–8.1)

## 2020-11-08 LAB — URINALYSIS, COMPLETE (UACMP) WITH MICROSCOPIC
Bilirubin Urine: NEGATIVE
Glucose, UA: NEGATIVE mg/dL
Hgb urine dipstick: NEGATIVE
Ketones, ur: NEGATIVE mg/dL
Leukocytes,Ua: NEGATIVE
Nitrite: NEGATIVE
Protein, ur: NEGATIVE mg/dL
Specific Gravity, Urine: 1.016 (ref 1.005–1.030)
pH: 7 (ref 5.0–8.0)

## 2020-11-08 LAB — URINE DRUG SCREEN, QUALITATIVE (ARMC ONLY)
Amphetamines, Ur Screen: NOT DETECTED
Barbiturates, Ur Screen: POSITIVE — AB
Benzodiazepine, Ur Scrn: NOT DETECTED
Cannabinoid 50 Ng, Ur ~~LOC~~: NOT DETECTED
Cocaine Metabolite,Ur ~~LOC~~: NOT DETECTED
MDMA (Ecstasy)Ur Screen: NOT DETECTED
Methadone Scn, Ur: NOT DETECTED
Opiate, Ur Screen: NOT DETECTED
Phencyclidine (PCP) Ur S: NOT DETECTED
Tricyclic, Ur Screen: NOT DETECTED

## 2020-11-08 LAB — CBC WITH DIFFERENTIAL/PLATELET
Abs Immature Granulocytes: 0.01 10*3/uL (ref 0.00–0.07)
Basophils Absolute: 0.1 10*3/uL (ref 0.0–0.1)
Basophils Relative: 1 %
Eosinophils Absolute: 0.2 10*3/uL (ref 0.0–0.5)
Eosinophils Relative: 5 %
HCT: 37 % (ref 36.0–46.0)
Hemoglobin: 12.2 g/dL (ref 12.0–15.0)
Immature Granulocytes: 0 %
Lymphocytes Relative: 30 %
Lymphs Abs: 1.6 10*3/uL (ref 0.7–4.0)
MCH: 29.3 pg (ref 26.0–34.0)
MCHC: 33 g/dL (ref 30.0–36.0)
MCV: 88.7 fL (ref 80.0–100.0)
Monocytes Absolute: 0.6 10*3/uL (ref 0.1–1.0)
Monocytes Relative: 11 %
Neutro Abs: 2.9 10*3/uL (ref 1.7–7.7)
Neutrophils Relative %: 53 %
Platelets: 207 10*3/uL (ref 150–400)
RBC: 4.17 MIL/uL (ref 3.87–5.11)
RDW: 13.2 % (ref 11.5–15.5)
WBC: 5.4 10*3/uL (ref 4.0–10.5)
nRBC: 0 % (ref 0.0–0.2)

## 2020-11-08 LAB — PROTIME-INR
INR: 1 (ref 0.8–1.2)
Prothrombin Time: 12.5 seconds (ref 11.4–15.2)

## 2020-11-08 LAB — TROPONIN I (HIGH SENSITIVITY): Troponin I (High Sensitivity): 6 ng/L (ref <18)

## 2020-11-08 MED ORDER — GADOBUTROL 1 MMOL/ML IV SOLN
6.0000 mL | Freq: Once | INTRAVENOUS | Status: AC | PRN
  Administered 2020-11-08: 6 mL via INTRAVENOUS

## 2020-11-08 MED ORDER — SUCRALFATE 1 G PO TABS
1.0000 g | ORAL_TABLET | Freq: Three times a day (TID) | ORAL | Status: DC
  Administered 2020-11-09: 1 g via ORAL
  Filled 2020-11-08: qty 1

## 2020-11-08 MED ORDER — SODIUM CHLORIDE 0.9 % IV SOLN: INTRAVENOUS | Status: DC

## 2020-11-08 MED ORDER — ALPRAZOLAM 0.5 MG PO TABS: 0.2500 mg | ORAL_TABLET | Freq: Two times a day (BID) | ORAL | Status: DC | PRN

## 2020-11-08 MED ORDER — B COMPLEX-C PO TABS
1.0000 | ORAL_TABLET | Freq: Every day | ORAL | Status: DC
  Filled 2020-11-08: qty 1

## 2020-11-08 MED ORDER — LEVOTHYROXINE SODIUM 88 MCG PO TABS
88.0000 ug | ORAL_TABLET | Freq: Every day | ORAL | Status: DC
  Administered 2020-11-09: 88 ug via ORAL
  Filled 2020-11-08 (×2): qty 1

## 2020-11-08 MED ORDER — ASPIRIN EC 81 MG PO TBEC
81.0000 mg | DELAYED_RELEASE_TABLET | Freq: Every day | ORAL | Status: DC
  Administered 2020-11-09: 81 mg via ORAL
  Filled 2020-11-08: qty 1

## 2020-11-08 MED ORDER — LINACLOTIDE 145 MCG PO CAPS
145.0000 ug | ORAL_CAPSULE | Freq: Every day | ORAL | Status: DC
Start: 2020-11-08 — End: 2020-11-10
  Filled 2020-11-08: qty 1

## 2020-11-08 MED ORDER — PANTOPRAZOLE SODIUM 40 MG PO TBEC
40.0000 mg | DELAYED_RELEASE_TABLET | Freq: Every day | ORAL | Status: DC
  Filled 2020-11-08: qty 1

## 2020-11-08 MED ORDER — IOHEXOL 350 MG/ML SOLN
75.0000 mL | Freq: Once | INTRAVENOUS | Status: AC | PRN
  Administered 2020-11-08: 75 mL via INTRAVENOUS

## 2020-11-08 MED ORDER — ASPIRIN 325 MG PO TABS
325.0000 mg | ORAL_TABLET | Freq: Once | ORAL | Status: AC
  Administered 2020-11-08: 325 mg via ORAL
  Filled 2020-11-08 (×2): qty 1

## 2020-11-08 MED ORDER — GABAPENTIN 100 MG PO CAPS: 100.0000 mg | ORAL_CAPSULE | Freq: Every day | ORAL | Status: DC

## 2020-11-08 MED ORDER — ATORVASTATIN CALCIUM 20 MG PO TABS: 80.0000 mg | ORAL_TABLET | Freq: Every day | ORAL | Status: DC

## 2020-11-08 NOTE — Telephone Encounter (Signed)
Pt said last  10 days has happened x 2; yesterday last time happened and episode lasted 1 hr pt was reading a simple novel and could not comprehend the words that she was reading and then she got a bad H/A; pt has not had any other symptoms and this morning seems to be doing well when pt read the newspaper this morning she did not have problems understanding what she was reading. Pt said her husband will be home in 1 - 2 hrs and he will take pt to [facility] ED for eval and possible testing such as imaging of head if needed. If pt has symptoms prior to pts husband return home pt will call 911. Pt did not want to cancel appt already has with Dr Damita Dunnings on 11/09/20 at this time in case needs ED FU.pt will call and cancel appt if not needed. Sending note to DR Damita Dunnings and Dr Glori Bickers.

## 2020-11-08 NOTE — ED Notes (Signed)
Pt presented to the ED with confusion. Pt states the episodes started 3-4 days ago and had another one yesterday late afternoon. Pt states that she was reading and couldn't comprehend the words. Pt also states she called [facility] and they told her to come here. Denies numbness and weakness, pt is A&Ox4, NAD.

## 2020-11-08 NOTE — ED Notes (Signed)
Pt dropped ASA pill during administration. Will call pharmacy to send a new one

## 2020-11-08 NOTE — Telephone Encounter (Signed)
[place] Night - Client Nonclinical Telephone Record [facility] Client [place] Night - Client Client Site [place] Physician Loura Pardon - MD Contact Type Call Who Is Calling Patient / Member / Family / Caregiver Caller Name Adelaine Roppolo Caller Phone Number [phone] Patient Name Sarah Harper Patient DOB [birth year] Call Type Message Only Information Provided Reason for Call Request to Schedule Office Appointment Initial Comment Caller wants to schedule appointment Additional Comment Office hours provided. Triage refused. Disp. Time Disposition Final User 11/08/2020 7:50:29 AM General Information Provided Yes Marshell Garfinkel Call Closed By: Marshell Garfinkel Transaction Date/Time: 11/08/2020 7:48:33 AM (ET)

## 2020-11-08 NOTE — ED Provider Notes (Signed)
[facility] Emergency Department Provider Note  ____________________________________________   Event Date/Time   First MD Initiated Contact with Patient 11/08/20 1344     (approximate)  I have reviewed the triage vital signs and the nursing notes.   HISTORY  Chief Complaint Altered Mental Status    HPI Sarah Harper is a 80 y.o. female with past medical history of IBS, degenerative disease, remote history of migraines, here with 2 episodes of reading difficulty.  The patient states that on 2 separate occasions over the last week, she has been reading when she suddenly lost the ability to comprehend the words.  She states she was able to see them clearly, and make out the letters, but could not actually understand what the lurch were or meant.  She was not talking to anyone or hearing anything, so she is not sure if she had any aphasia.  She denies any numbness or weakness.  On both occasions, her symptoms lasted 15 to 30 minutes, then resolved.  She then developed a left aching, throbbing, temporal headache.  She does have a remote history of migraines but has been years since she had any headaches and she did not have any reading difficulties with those.  She does have a history of a single ocular migraine in the past as well.  No recent medication changes.        Past Medical History:  Diagnosis Date  . DDD (degenerative disc disease)    chronic back pain  . ETD (eustachian tube dysfunction)   . GERD (gastroesophageal reflux disease)   . IBS (irritable bowel syndrome)   . Meniere's disease   . Migraine    still gets visual aura from time to time  . Osteoporosis   . Sleep apnea    CPAP  . Syncopal episodes    after work-up - possible seizures?  . Thyroid disease    hypothyroid    Patient Active Problem List   Diagnosis Date Noted  . Acute CVA (cerebrovascular accident) ([place]) 11/08/2020  . Right knee pain 09/26/2019  . Headache 09/26/2019  .  Fall 06/02/2019  . Abrasion of right knee 06/02/2019  . Urinary retention 05/09/2019  . Syncope and collapse 12/12/2018  . Colon cancer screening 02/05/2018  . Degenerative joint disease (DJD) of lumbar spine 05/02/2017  . Internal hemorrhoids 05/02/2017  . Generalized osteoarthritis of hand 12/11/2016  . Stress reaction 12/11/2016  . Estrogen deficiency 05/11/2016  . Encounter for screening mammogram for breast cancer 05/11/2016  . OSA (obstructive sleep apnea) 03/06/2016  . Fatigue 01/25/2016  . Right ankle swelling 10/18/2015  . Left knee pain 10/18/2015  . Snoring 10/18/2015  . Routine general medical examination at a health care facility 02/28/2015  . Encounter for Medicare annual wellness exam 02/06/2013  . Hyperlipidemia 08/27/2012  . Irregular heart beat 08/05/2012  . Gynecological examination 12/20/2010  . BARRETTS ESOPHAGUS 10/01/2010  . Osteopenia 12/17/2009  . BACK PAIN, LUMBAR 07/02/2009  . IRRITABLE BOWEL SYNDROME 04/02/2009  . POSTMENOPAUSAL STATUS 12/10/2008  . Hypothyroidism 12/05/2007  . Meniere's disease 12/05/2007  . GERD 12/05/2007    Past Surgical History:  Procedure Laterality Date  . APPENDECTOMY    . CATARACT EXTRACTION W/PHACO Right 05/21/2019   Procedure: CATARACT EXTRACTION PHACO AND INTRAOCULAR LENS PLACEMENT ([place]) RIGHT panoptix lens  00:35.0  21.7%  7.61;  Surgeon: Leandrew Koyanagi, MD;  Location: [place];  Service: Ophthalmology;  Laterality: Right;  sleep apnea requests early  . CATARACT  EXTRACTION W/PHACO Left 06/11/2019   Procedure: CATARACT EXTRACTION PHACO AND INTRAOCULAR LENS PLACEMENT ([facility]) LEFT PANOPTIX TORIC LENS  00:50.0  20.3%  10.21;  Surgeon: Leandrew Koyanagi, MD;  Location: [place];  Service: Ophthalmology;  Laterality: Left;  sleep apnea requests early  . COLONOSCOPY     last 2012 - [facility]  . ESOPHAGOGASTRODUODENOSCOPY     Multiple, last 01/07/2017 with Savary dilation to 18 mm  . HEMORRHOID  BANDING  2018   [facility]  . LUMBAR DISC SURGERY  1984   L5     Prior to Admission medications   Medication Sig Start Date End Date Taking? Authorizing Provider  ALPRAZolam (XANAX) 0.25 MG tablet Take 1 tablet (0.25 mg total) by mouth 2 (two) times daily as needed for anxiety (with caution of sedation). 03/09/20   Tower, [name], MD  b complex vitamins tablet Take 1 tablet by mouth daily.    [provider]  butalbital-acetaminophen-caffeine (FIORICET) 50-325-40 MG tablet Take 1 tablet by mouth every 6 (six) hours as needed. 03/09/20   Tower, [name], MD  Calcium Carb-Cholecalciferol (CALCIUM 600 + D PO) Take 1 tablet by mouth daily.    [provider]  dexlansoprazole (DEXILANT) 60 MG capsule Take 1 capsule (60 mg total) by mouth daily. 03/09/20   Tower, [name], MD  diclofenac sodium (VOLTAREN) 1 % GEL APPLY 2 GRAMS TOPICALLY FOUR TIMES A DAY TO AFFECTED AREAS 05/24/18   Tower, [name], MD  estradiol (ESTRACE) 0.1 MG/GM vaginal cream Insert small amount (1 cm) in vagina every other day 03/09/20   Tower, [name], MD  gabapentin (NEURONTIN) 100 MG capsule Take 1 capsule (100 mg total) by mouth at bedtime. 08/09/20   Hyatt, Max T, DPM  levothyroxine (SYNTHROID) 88 MCG tablet Take 1 tablet (88 mcg total) by mouth daily before breakfast. 03/09/20   Tower, [name], MD  linaclotide [facility]) 145 MCG CAPS capsule Take 1 capsule (145 mcg total) by mouth daily. 03/09/20   Tower, [name], MD  mometasone (NASONEX) 50 MCG/ACT nasal spray Place 2 sprays into the nose daily as needed.    [provider]  pseudoephedrine (SUDAFED) 60 MG tablet Take 60 mg by mouth every 4 (four) hours as needed for congestion.    [provider]  sucralfate (CARAFATE) 1 g tablet TAKE 1 TABLET (1 G TOTAL) BY MOUTH 4 (FOUR) TIMES DAILY - WITH MEALS AND AT BEDTIME. 03/10/20   Gatha Mayer, MD    Allergies Nsaids and Tolmetin  Family History  Problem Relation Age of Onset  . Diabetes Paternal  Aunt   . Breast cancer Paternal Aunt   . Hypertension Maternal Grandmother   . Hypertension Maternal Grandfather     Social History Social History   Tobacco Use  . Smoking status: Never Smoker  . Smokeless tobacco: Never Used  Vaping Use  . Vaping Use: Never used  Substance Use Topics  . Alcohol use: Not Currently    Comment: wine occasional  . Drug use: No    Review of Systems  Review of Systems  Constitutional: Negative for chills and fever.  HENT: Negative for sore throat.   Respiratory: Negative for shortness of breath.   Cardiovascular: Negative for chest pain.  Gastrointestinal: Negative for abdominal pain.  Genitourinary: Negative for flank pain.  Musculoskeletal: Negative for neck pain.  Skin: Negative for rash and wound.  Allergic/Immunologic: Negative for immunocompromised state.  Neurological: Negative for weakness and numbness.  Reading comprehension as per HPI.  Hematological: Does not bruise/bleed easily.     ____________________________________________  PHYSICAL EXAM:      VITAL SIGNS: ED Triage Vitals  Enc Vitals Group     BP 11/08/20 1317 (!) 151/85     Pulse Rate 11/08/20 1317 75     Resp 11/08/20 1317 20     Temp 11/08/20 1317 98.4 F (36.9 C)     Temp Source 11/08/20 1317 Oral     SpO2 11/08/20 1317 97 %     Weight 11/08/20 1318 137 lb (62.1 kg)     Height 11/08/20 1318 5' 5.5" (1.664 m)     Head Circumference --      Peak Flow --      Pain Score 11/08/20 1318 2     Pain Loc --      Pain Edu? --      Excl. in [place]? --      Physical Exam Vitals and nursing note reviewed.  Constitutional:      General: She is not in acute distress.    Appearance: She is well-developed.  HENT:     Head: Normocephalic and atraumatic.  Eyes:     Conjunctiva/sclera: Conjunctivae normal.  Cardiovascular:     Rate and Rhythm: Normal rate and regular rhythm.     Heart sounds: Normal heart sounds.  Pulmonary:     Effort: Pulmonary effort is  normal. No respiratory distress.     Breath sounds: No wheezing.  Abdominal:     General: There is no distension.  Musculoskeletal:     Cervical back: Neck supple.  Skin:    General: Skin is warm.     Capillary Refill: Capillary refill takes less than 2 seconds.     Findings: No rash.  Neurological:     Mental Status: She is alert and oriented to person, place, and time.     Motor: No abnormal muscle tone.     Comments: Neurological Exam:  Mental Status: Alert and oriented to person, place, and time. Attention and concentration normal. Speech clear. Recent memory is intact. Cranial Nerves: Visual fields grossly intact. EOMI and PERRLA. No nystagmus noted. Facial sensation intact at forehead, maxillary cheek, and chin/mandible bilaterally. No facial asymmetry or weakness. Hearing grossly normal. Uvula is midline, and palate elevates symmetrically. Normal SCM and trapezius strength. Tongue midline without fasciculations. Motor: Muscle strength 5/5 in proximal and distal UE and LE bilaterally. No pronator drift. Muscle tone normal. Sensation: Intact to light touch in upper and lower extremities distally bilaterally.  Gait: Normal without ataxia. Coordination: Normal FTN bilaterally.          ____________________________________________   LABS (all labs ordered are listed, but only abnormal results are displayed)  Labs Reviewed  COMPREHENSIVE METABOLIC PANEL - Abnormal; Notable for the following components:      Result Value   Sodium 132 (*)    Glucose, Bld 109 (*)    Calcium 8.8 (*)    Total Protein 6.3 (*)    All other components within normal limits  CBC WITH DIFFERENTIAL/PLATELET  PROTIME-INR  URINALYSIS, COMPLETE (UACMP) WITH MICROSCOPIC  URINE DRUG SCREEN, QUALITATIVE ([facility] ONLY)  HEMOGLOBIN A1C  LIPID PANEL  TSH  TROPONIN I (HIGH SENSITIVITY)    ____________________________________________  EKG: Normal sinus rhythm, ventricular rate 67.  QRS 1 1, QTc 442.  No  acute ST elevations or depression.  Left axis deviation.  ________________________________________  RADIOLOGY All imaging, including plain films, CT scans, and  ultrasounds, independently reviewed by me, and interpretations confirmed via formal radiology reads.  ED MD interpretation:   CT Angio Head/Neck: NO significant vascular abnormality MR Brain: Punctate acute infarct in high left frontal lobe, ? Small extra-axial collection   Official radiology report(s): CT Angio Head W or Wo Contrast  Result Date: 11/08/2020 CLINICAL DATA:  Episodes of confusion EXAM: CT ANGIOGRAPHY HEAD AND NECK TECHNIQUE: Multidetector CT imaging of the head and neck was performed using the standard protocol during bolus administration of intravenous contrast. Multiplanar CT image reconstructions and MIPs were obtained to evaluate the vascular anatomy. Carotid stenosis measurements (when applicable) are obtained utilizing NASCET criteria, using the distal internal carotid diameter as the denominator. CONTRAST:  47mL OMNIPAQUE IOHEXOL 350 MG/ML SOLN COMPARISON:  April 2020 FINDINGS: CTA NECK Aortic arch: Great vessel origins are patent. There is aberrant origin of the right subclavian artery, an anatomic variant. Right carotid system: Patent. Calcified plaque at the ICA origin causes less than 50% stenosis. Left carotid system: Patent. Calcified plaque at the ICA origin without stenosis. Vertebral arteries: Patent and codominant. Skeleton: Degenerative changes of the cervical spine. Other neck: Negative. Upper chest: Included upper lungs are clear. Review of the MIP images confirms the above findings CTA HEAD Anterior circulation: Intracranial internal carotid arteries are patent with mild calcified plaque. Anterior cerebral arteries are patent. Left A1 ACA is dominant. Posterior circulation: Intracranial vertebral arteries and basilar artery are patent. Major cerebellar artery origins are patent. Posterior cerebral arteries are  patent. Venous sinuses: Patent as allowed by contrast bolus timing. Review of the MIP images confirms the above findings IMPRESSION: No occlusion in the neck. Similar plaque at the right greater than left ICA origins with less than 50% stenosis. No proximal intracranial vessel occlusion. Electronically Signed   By: Macy Mis M.D.   On: 11/08/2020 16:40   CT Head Wo Contrast  Result Date: 11/08/2020 CLINICAL DATA:  Patient to ER for c/o confusion. Patient states she has had two episodes (first episode five days ago, second episode was yesterday) that she was reading a book and was unable to comprehend the words. EXAM: CT HEAD WITHOUT CONTRAST TECHNIQUE: Contiguous axial images were obtained from the base of the skull through the vertex without intravenous contrast. COMPARISON:  None. FINDINGS: Brain: No evidence of acute infarction, hemorrhage, hydrocephalus, extra-axial collection or mass lesion/mass effect. Generalized cerebral atrophy. Periventricular white matter low attenuation likely secondary to microangiopathy. Vascular: No hyperdense vessel or unexpected calcification. Skull: No osseous abnormality. Sinuses/Orbits: Visualized paranasal sinuses are clear. Visualized mastoid sinuses are clear. Visualized orbits demonstrate no focal abnormality. Other: None IMPRESSION: 1. No acute intracranial pathology. Electronically Signed   By: Kathreen Devoid   On: 11/08/2020 14:39   CT Angio Neck W and/or Wo Contrast  Result Date: 11/08/2020 CLINICAL DATA:  Episodes of confusion EXAM: CT ANGIOGRAPHY HEAD AND NECK TECHNIQUE: Multidetector CT imaging of the head and neck was performed using the standard protocol during bolus administration of intravenous contrast. Multiplanar CT image reconstructions and MIPs were obtained to evaluate the vascular anatomy. Carotid stenosis measurements (when applicable) are obtained utilizing NASCET criteria, using the distal internal carotid diameter as the denominator. CONTRAST:   42mL OMNIPAQUE IOHEXOL 350 MG/ML SOLN COMPARISON:  April 2020 FINDINGS: CTA NECK Aortic arch: Great vessel origins are patent. There is aberrant origin of the right subclavian artery, an anatomic variant. Right carotid system: Patent. Calcified plaque at the ICA origin causes less than 50% stenosis. Left carotid system: Patent. Calcified plaque  at the ICA origin without stenosis. Vertebral arteries: Patent and codominant. Skeleton: Degenerative changes of the cervical spine. Other neck: Negative. Upper chest: Included upper lungs are clear. Review of the MIP images confirms the above findings CTA HEAD Anterior circulation: Intracranial internal carotid arteries are patent with mild calcified plaque. Anterior cerebral arteries are patent. Left A1 ACA is dominant. Posterior circulation: Intracranial vertebral arteries and basilar artery are patent. Major cerebellar artery origins are patent. Posterior cerebral arteries are patent. Venous sinuses: Patent as allowed by contrast bolus timing. Review of the MIP images confirms the above findings IMPRESSION: No occlusion in the neck. Similar plaque at the right greater than left ICA origins with less than 50% stenosis. No proximal intracranial vessel occlusion. Electronically Signed   By: Macy Mis M.D.   On: 11/08/2020 16:40   MR BRAIN WO CONTRAST  Result Date: 11/08/2020 CLINICAL DATA:  Neuro deficit, acute, stroke suspected. EXAM: MRI HEAD WITHOUT CONTRAST TECHNIQUE: Multiplanar, multiecho pulse sequences of the brain and surrounding structures were obtained without intravenous contrast. COMPARISON:  CT angiogram head/neck performed earlier today 11/08/2020. Contrast head CT performed earlier today 11/08/2020. Brain MRI 01/17/2019. FINDINGS: Brain: Mild cerebral atrophy. Punctate acute infarct within the high left frontal lobe (series 5, image 40). New from the prior brain MRI of 01/17/2019, there is a small focus of extra-axial T2/FLAIR hyperintensity with  corresponding restricted diffusion overlying the mid left frontal lobe (for instance as seen on series 15, image 49) (series 7, image 25). This is nonspecific but may reflect a trace extra-axial collection. Redemonstrated curvilinear SWI signal loss along the mid to posterior left frontal lobe compatible with chronic hemosiderin deposition from remote subarachnoid hemorrhage. Mild multifocal T2/FLAIR hyperintensity within the cerebral white matter is nonspecific, but compatible with chronic small vessel ischemic disease. Chronic small vessel ischemic changes are also present within the pons. No midline shift. Vascular: Expected proximal arterial flow voids.  No focal Skull and upper cervical spine: No focal marrow lesion. Sinuses/Orbits: Visualized orbits show no acute finding. Trace bilateral ethmoid sinus mucosal thickening. IMPRESSION: Punctate acute infarct within the high left frontal lobe. New from the prior brain MRI of 01/17/2019, there is a small focus of extra-axial T2/FLAIR hyperintensity with corresponding restricted diffusion overlying the mid left frontal lobe. This is nonspecific but may reflect a trace extra-axial collection. Consider post-contrast MR imaging of the brain for further characterization. Redemonstrated chronic hemosiderin deposition along the mid to posterior left frontal lobe compatible with sequela of remote subarachnoid hemorrhage. Mild chronic small vessel ischemic disease within the cerebral white matter and pons and mild cerebral atrophy, stable as compared to the brain MRI of 01/17/2019. Electronically Signed   By: Kellie Simmering DO   On: 11/08/2020 18:01   MR BRAIN W CONTRAST  Result Date: 11/08/2020 CLINICAL DATA:  Left frontal infarct. Possible small extra-axial collection. EXAM: MRI HEAD WITH CONTRAST TECHNIQUE: Multiplanar, multiecho pulse sequences of the brain and surrounding structures were obtained with intravenous contrast. CONTRAST:  52mL GADAVIST GADOBUTROL 1 MMOL/ML  IV SOLN COMPARISON:  Brain MRI without contrast 11/08/2020 FINDINGS: There is no abnormal contrast enhancement. No extra-axial collection. Otherwise, the examination is unchanged compared to the earlier brain MRI without contrast. IMPRESSION: No extra-axial collection or abnormal contrast enhancement. Electronically Signed   By: Ulyses Jarred M.D.   On: 11/08/2020 20:52    ____________________________________________  PROCEDURES   Procedure(s) performed (including Critical Care):  .1-3 Lead EKG Interpretation Performed by: Duffy Bruce, MD Authorized by: Duffy Bruce, MD  Interpretation: normal     ECG rate:  70-90   ECG rate assessment: normal     Rhythm: sinus rhythm     Ectopy: none     Conduction: normal   Comments:     Indication: stroke    ____________________________________________  INITIAL IMPRESSION / MDM / ASSESSMENT AND PLAN / ED COURSE  As part of my medical decision making, I reviewed the following data within the [place] notes reviewed and incorporated, Old chart reviewed, Notes from prior ED visits, and [employer] Controlled Substance Database       *Anetha L Pichon was evaluated in Emergency Department on 11/08/2020 for the symptoms described in the history of present illness. She was evaluated in the context of the global COVID-19 pandemic, which necessitated consideration that the patient might be at risk for infection with the SARS-CoV-2 virus that causes COVID-19. Institutional protocols and algorithms that pertain to the evaluation of patients at risk for COVID-19 are in a state of rapid change based on information released by regulatory bodies including the CDC and federal and state organizations. These policies and algorithms were followed during the patient's care in the ED.  Some ED evaluations and interventions may be delayed as a result of limited staffing during the pandemic.*     Medical Decision Making:  80 yo F here  with transient difficulty comprehending reading, now resolved. No focal neurological deficits on exam here. CT head shows NAICA on my review. Labs reviewed, show mild hyponatremia, otherwise normal WBC, Hgb. EKG non ischemic without signs of arrhythmia. Discussed case with Neurology, who recommends CT Angio, MR and admission for suspected TIA vs small CVA. DDx includes atypical migraine though would be hesitant to diagnose this given age/comorbidities.   ____________________________________________  FINAL CLINICAL IMPRESSION(S) / ED DIAGNOSES  Final diagnoses:  Stroke-like episode     MEDICATIONS GIVEN DURING THIS VISIT:  Medications  ALPRAZolam (XANAX) tablet 0.25 mg (has no administration in time range)  b complex vitamins tablet 1 tablet (has no administration in time range)  pantoprazole (PROTONIX) EC tablet 40 mg (has no administration in time range)  gabapentin (NEURONTIN) capsule 100 mg (has no administration in time range)  levothyroxine (SYNTHROID) tablet 88 mcg (has no administration in time range)  linaclotide (LINZESS) capsule 145 mcg (has no administration in time range)  sucralfate (CARAFATE) tablet 1 g (has no administration in time range)  aspirin tablet 325 mg (325 mg Oral Given 11/08/20 2226)    Followed by  aspirin EC tablet 81 mg (has no administration in time range)  atorvastatin (LIPITOR) tablet 80 mg (has no administration in time range)  iohexol (OMNIPAQUE) 350 MG/ML injection 75 mL (75 mLs Intravenous Contrast Given 11/08/20 1606)  gadobutrol (GADAVIST) 1 MMOL/ML injection 6 mL (6 mLs Intravenous Contrast Given 11/08/20 2041)     ED Discharge Orders    None       Note:  This document was prepared using Dragon voice recognition software and may include unintentional dictation errors.   Duffy Bruce, MD 11/08/20 2240

## 2020-11-08 NOTE — ED Notes (Signed)
Bedside nursing dysphagia screen performed, pt passed. Denies difficulty swallowing. No noted issues with patient swallowing water.

## 2020-11-08 NOTE — ED Notes (Signed)
Pt requesting water at this time. Pt given cup of water with Dr. Myrene Buddy, MD approval.

## 2020-11-08 NOTE — Consult Note (Signed)
Neurology Consultation Reason for Consult: Transient comprehension impariment Requesting Physician: Duffy Bruce   CC: Transient difficulty reading  History is obtained from: Patient and chart review   HPI: Sarah Harper is a 80 y.o. female with a past medical history significant for degenerative disc disease, irritable bowel syndrome, Mnire's disease, prior migraines, osteoporosis, sleep apnea on CPAP, hypothyroidism.  She reports she was in her usual state of health when she had an episode of difficulty reading last Thursday or Friday evening.  This lasted about 15 to 20 minutes.  She was alone and did not try to speak at the time.  She denied having difficulty seeing, but could not parse the words that she was trying to read.  Shortly after the episode she had a severe headache (6 out of 10 in intensity compared to 10/10 intensity migraines that she is to have prior to menopause).  This was a bifrontal headache and was relieved by going to sleep.  She had a recurrent episode on Sunday night that was very similar, but this time she took half a tablet of Fioricet for the headache, but had trouble sleeping due to ongoing head pain.  4 hours later she took another Fioricet and was able to sleep.  On waking she did not have a headache any longer.  This headache was not clearly positional and was not associated with nausea, vomiting or difficulty seeing.  In early 2020 she did have an episode of slumping over towards the right for which she was evaluated for concern for seizure by Dr. Manuella Ghazi at [facility].  MRI brain with and without contrast at the time as well as routine EEG were negative.  Additionally several years ago she additionally had 2 episodes of seeing colored lights for 2 to 3 hours which was evaluated by an ophthalmologist with a diagnosis of ocular migraine.  She denies any recent medication changes and reports that 30 tabs of Fioricet last for a year (she takes half a tab at a  time so roughly taking 1/week).  She also takes Xanax 0.25 mg approximately once per month at this time.  ROS: All other review of systems was negative except as noted in the HPI.   Past Medical History:  Diagnosis Date  . DDD (degenerative disc disease)    chronic back pain  . ETD (eustachian tube dysfunction)   . GERD (gastroesophageal reflux disease)   . IBS (irritable bowel syndrome)   . Meniere's disease   . Migraine    still gets visual aura from time to time  . Osteoporosis   . Sleep apnea    CPAP  . Syncopal episodes    after work-up - possible seizures?  . Thyroid disease    hypothyroid   Past Surgical History:  Procedure Laterality Date  . APPENDECTOMY    . CATARACT EXTRACTION W/PHACO Right 05/21/2019   Procedure: CATARACT EXTRACTION PHACO AND INTRAOCULAR LENS PLACEMENT ([facility]) RIGHT panoptix lens  00:35.0  21.7%  7.61;  Surgeon: Leandrew Koyanagi, MD;  Location: [facility];  Service: Ophthalmology;  Laterality: Right;  sleep apnea requests early  . CATARACT EXTRACTION W/PHACO Left 06/11/2019   Procedure: CATARACT EXTRACTION PHACO AND INTRAOCULAR LENS PLACEMENT ([facility]) LEFT PANOPTIX TORIC LENS  00:50.0  20.3%  10.21;  Surgeon: Leandrew Koyanagi, MD;  Location: [facility];  Service: Ophthalmology;  Laterality: Left;  sleep apnea requests early  . COLONOSCOPY     last 2012 - [facility]  . ESOPHAGOGASTRODUODENOSCOPY  Multiple, last 01/07/2017 with Savary dilation to 18 mm  . HEMORRHOID BANDING  2018   [facility]  . LUMBAR DISC SURGERY  1984   L5      Family History  Problem Relation Age of Onset  . Diabetes Paternal Aunt   . Breast cancer Paternal Aunt   . Hypertension Maternal Grandmother   . Hypertension Maternal Grandfather    Current Outpatient Medications  Medication Instructions  . ALPRAZolam (XANAX) 0.25 mg, Oral, 2 times daily PRN  . b complex vitamins tablet 1 tablet, Oral, Daily  . butalbital-acetaminophen-caffeine (FIORICET)  50-325-40 MG tablet 1 tablet, Oral, Every 6 hours PRN  . Calcium Carb-Cholecalciferol (CALCIUM 600 + D PO) 1 tablet, Oral, Daily  . Dexilant 60 mg, Oral, Daily  . diclofenac sodium (VOLTAREN) 1 % GEL APPLY 2 GRAMS TOPICALLY FOUR TIMES A DAY TO AFFECTED AREAS  . estradiol (ESTRACE) 0.1 MG/GM vaginal cream Insert small amount (1 cm) in vagina every other day  . gabapentin (NEURONTIN) 100 mg, Oral, Daily at bedtime  . levothyroxine (SYNTHROID) 88 mcg, Oral, Daily before breakfast  . linaclotide (LINZESS) 145 mcg, Oral, Daily  . mometasone (NASONEX) 50 MCG/ACT nasal spray 2 sprays, Nasal, Daily PRN  . pseudoephedrine (SUDAFED) 60 mg, Oral, Every 4 hours PRN  . sucralfate (CARAFATE) 1 g, Oral, 3 times daily with meals & bedtime   Pharmacist med review pending, patient tells me she is stopped taking the gabapentin and the Sudafed  Social History:  reports that she has never smoked. She has never used smokeless tobacco. She reports previous alcohol use. She reports that she does not use drugs.  Exam: Current vital signs: BP (!) 148/77   Pulse 60   Temp 98.4 F (36.9 C) (Oral)   Resp 15   Ht 5' 5.5" (1.664 m)   Wt 62.1 kg   SpO2 97%   BMI 22.45 kg/m  Vital signs in last 24 hours: Temp:  [98.4 F (36.9 C)] 98.4 F (36.9 C) (03/21 1317) Pulse Rate:  [60-75] 60 (03/21 1600) Resp:  [15-20] 15 (03/21 1600) BP: (132-151)/(62-85) 148/77 (03/21 1600) SpO2:  [97 %] 97 % (03/21 1600) Weight:  [62.1 kg] 62.1 kg (03/21 1318)   Physical Exam  Constitutional: Appears well-developed and well-nourished.  Psych: Affect appropriate to situation, calm and cooperative Eyes: No scleral injection HENT: No oropharyngeal obstruction.  MSK: no joint deformities.  Cardiovascular: Normal rate and regular rhythm.  Respiratory: Effort normal, non-labored breathing GI: Soft.  No distension. There is no tenderness.  Skin: Warm dry and intact visible skin  Neuro: Mental Status: Patient is awake, alert,  oriented to person, place, month, year, and situation. Patient is able to give a clear and coherent history. No signs of aphasia or neglect Cranial Nerves II: Visual Fields are full. Pupils are equal, round, and reactive to light.   III,IV, VI: EOMI without ptosis or diploplia.  V: Facial sensation is symmetric to temperature VII: Facial movement is symmetric.  VIII: hearing is intact to voice X: Uvula elevates symmetrically XI: Shoulder shrug is symmetric. XII: tongue is midline without atrophy or fasciculations.  Motor: Tone is normal. Bulk is normal. 5/5 strength was present in all four extremities.  Sensory: Sensation is symmetric to light touch and temperature in the arms and legs.  There is a slight length dependent temperature loss. Deep Tendon Reflexes: 2+ and symmetric in the biceps and patellae.  Cerebellar: FNF and HKS are intact bilaterally Gait: Able to rise on her heels  and toes.  Tandem is mildly unsteady which she reports is her baseline.   I have reviewed labs in epic and the results pertinent to this consultation are: Cr 0.65  I have reviewed the images obtained: Dry head CT without acute intracranial process CTA without flow-limiting stenosis or large vessel occlusion, though there is some mild plaque at the bilateral internal carotid origins and other minor scattered atherosclerosis  Impression: This is a 80 year old woman with a past medical history significant for prior migraine headaches, presenting with 2 episodes of reading impairment that were stereotyped in nature.  Differential includes TIA, seizure, or complex migraine.  The latter is a diagnosis of exclusion in a patient of this age despite her prior migraine history  Recommendations: -CTA head and neck completed on my recommendation -MRI brain completed on my recommendation -Echocardiogram -Hemoglobin A1c, lipid panel, TSH -Routine EEG -Pending these results I will discuss starting a baby aspirin  with the patient in the morning  Lesleigh Noe MD-PhD [facility] [phone] [facility] coverage for [facility] is from 8 AM to 4 AM in-house and 4 PM to 8 PM by telephone/video. 8 PM to 8 AM emergent questions or overnight urgent questions should be addressed to Teleneurology On-call or [facility] neurohospitalist; contact information can be found on AMION

## 2020-11-08 NOTE — ED Notes (Signed)
Attempted to get urine sample, pt states that she had recently peed and unable to go right now. Explained to pt to let us know next time she has to use the restroom. Call bell within reach.

## 2020-11-08 NOTE — Telephone Encounter (Signed)
I'll await the ER notes.  Thanks.

## 2020-11-08 NOTE — ED Triage Notes (Signed)
Patient to ER for c/o confusion. Patient states she has had two episodes (first episode five days ago, second episode was yesterday) that she was reading a book and was unable to comprehend the words. States today she has slight headache over left eye, but no neuro deficits noted. Also reports severe headaches at time of reading issues.

## 2020-11-08 NOTE — Telephone Encounter (Signed)
Unable to speak with pt at either contact Number. Sending note to Kaufman at front desk.

## 2020-11-08 NOTE — ED Notes (Signed)
Neurologist at bedside. 

## 2020-11-08 NOTE — H&P (Signed)
History and Physical  Sarah Harper XFG:[id number] DOB: [birth year] DOA: 11/08/2020  Referring physician: Dr. Ellender Hose PCP: Sarah Bickers [name], MD  Outpatient Specialists: Cardiology, GI, podiatry. Patient coming from: Home.  Chief Complaint: Transient difficulty comprehending words read.  HPI: Sarah Harper is a 80 y.o. female with medical history significant for Mnire's disease, IBS, osteoporosis, OSA on CPAP, hypothyroidism, GERD who presented from home to [facility] ED due to transient difficulty comprehending words when she was reading today.  Patient was not able to tell me when she was last known well.  Associated with a sudden onset headache.  This also happened 3 days ago lasted for 15 minutes then resolved spontaneously.  She denies any vision loss, motor or sensory deficits.  She presented to the ED for further evaluation.  MRI brain showed punctate acute infarct within the high left frontal lobe.  Seen by neurology/stroke team.  TRH asked to admit for stroke work-up.  ED Course:  Afebrile, BP 165/77, pulse 72, respiration rate 18, O2 saturation 99% on room air.  Lab studies remarkable for serum sodium 132, troponin negative.  Review of Systems: Review of systems as noted in the HPI. All other systems reviewed and are negative.   Past Medical History:  Diagnosis Date  . DDD (degenerative disc disease)    chronic back pain  . ETD (eustachian tube dysfunction)   . GERD (gastroesophageal reflux disease)   . IBS (irritable bowel syndrome)   . Meniere's disease   . Migraine    still gets visual aura from time to time  . Osteoporosis   . Sleep apnea    CPAP  . Syncopal episodes    after work-up - possible seizures?  . Thyroid disease    hypothyroid   Past Surgical History:  Procedure Laterality Date  . APPENDECTOMY    . CATARACT EXTRACTION W/PHACO Right 05/21/2019   Procedure: CATARACT EXTRACTION PHACO AND INTRAOCULAR LENS PLACEMENT ([place]) RIGHT panoptix lens  00:35.0  21.7%   7.61;  Surgeon: Leandrew Koyanagi, MD;  Location: [place];  Service: Ophthalmology;  Laterality: Right;  sleep apnea requests early  . CATARACT EXTRACTION W/PHACO Left 06/11/2019   Procedure: CATARACT EXTRACTION PHACO AND INTRAOCULAR LENS PLACEMENT ([facility]) LEFT PANOPTIX TORIC LENS  00:50.0  20.3%  10.21;  Surgeon: Leandrew Koyanagi, MD;  Location: [place];  Service: Ophthalmology;  Laterality: Left;  sleep apnea requests early  . COLONOSCOPY     last 2012 - [facility]  . ESOPHAGOGASTRODUODENOSCOPY     Multiple, last 01/07/2017 with Savary dilation to 18 mm  . HEMORRHOID BANDING  2018   [facility]  . LUMBAR DISC SURGERY  1984   L5     Social History:  reports that she has never smoked. She has never used smokeless tobacco. She reports previous alcohol use. She reports that she does not use drugs.   Allergies  Allergen Reactions  . Nsaids     GI discomfort and gastritis  . Tolmetin Other (See Comments)    GI discomfort and gastritis    Family History  Problem Relation Age of Onset  . Diabetes Paternal Aunt   . Breast cancer Paternal Aunt   . Hypertension Maternal Grandmother   . Hypertension Maternal Grandfather       Prior to Admission medications   Medication Sig Start Date End Date Taking? Authorizing Provider  ALPRAZolam (XANAX) 0.25 MG tablet Take 1 tablet (0.25 mg total) by mouth 2 (two) times daily as needed for anxiety (with  caution of sedation). 03/09/20   Tower, [name], MD  b complex vitamins tablet Take 1 tablet by mouth daily.    [provider]  butalbital-acetaminophen-caffeine (FIORICET) 50-325-40 MG tablet Take 1 tablet by mouth every 6 (six) hours as needed. 03/09/20   Tower, [name], MD  Calcium Carb-Cholecalciferol (CALCIUM 600 + D PO) Take 1 tablet by mouth daily.    [provider]  dexlansoprazole (DEXILANT) 60 MG capsule Take 1 capsule (60 mg total) by mouth daily. 03/09/20   Tower, [name], MD  diclofenac sodium  (VOLTAREN) 1 % GEL APPLY 2 GRAMS TOPICALLY FOUR TIMES A DAY TO AFFECTED AREAS 05/24/18   Tower, [name], MD  estradiol (ESTRACE) 0.1 MG/GM vaginal cream Insert small amount (1 cm) in vagina every other day 03/09/20   Tower, [name], MD  gabapentin (NEURONTIN) 100 MG capsule Take 1 capsule (100 mg total) by mouth at bedtime. 08/09/20   Hyatt, Max T, DPM  levothyroxine (SYNTHROID) 88 MCG tablet Take 1 tablet (88 mcg total) by mouth daily before breakfast. 03/09/20   Tower, [name], MD  linaclotide [facility]) 145 MCG CAPS capsule Take 1 capsule (145 mcg total) by mouth daily. 03/09/20   Tower, [name], MD  mometasone (NASONEX) 50 MCG/ACT nasal spray Place 2 sprays into the nose daily as needed.    [provider]  pseudoephedrine (SUDAFED) 60 MG tablet Take 60 mg by mouth every 4 (four) hours as needed for congestion.    [provider]  sucralfate (CARAFATE) 1 g tablet TAKE 1 TABLET (1 G TOTAL) BY MOUTH 4 (FOUR) TIMES DAILY - WITH MEALS AND AT BEDTIME. 03/10/20   Gatha Mayer, MD    Physical Exam: BP (!) 165/77 (BP Location: Right Arm)   Pulse 72   Temp 98.4 F (36.9 C) (Oral)   Resp 18   Ht 5' 5.5" (1.664 m)   Wt 62.1 kg   SpO2 99%   BMI 22.45 kg/m   . General: 80 y.o. year-old female well developed well nourished in no acute distress.  Alert and oriented x3. . Cardiovascular: Regular rate and rhythm with no rubs or gallops.  No thyromegaly or JVD noted.  No lower extremity edema. 2/4 pulses in all 4 extremities. Marland Kitchen Respiratory: Clear to auscultation with no wheezes or rales. Good inspiratory effort. . Abdomen: Soft nontender nondistended with normal bowel sounds x4 quadrants. . Muskuloskeletal: No cyanosis, clubbing or edema noted bilaterally . Neuro: CN II-XII intact, strength, sensation, reflexes . Skin: No ulcerative lesions noted or rashes . Psychiatry: Judgement and insight appear normal. Mood is appropriate for condition and setting          Labs on Admission:   Basic Metabolic Panel: Recent Labs  Lab 11/08/20 1427  NA 132*  K 3.9  CL 99  CO2 27  GLUCOSE 109*  BUN 17  CREATININE 0.65  CALCIUM 8.8*   Liver Function Tests: Recent Labs  Lab 11/08/20 1427  AST 22  ALT 17  ALKPHOS 73  BILITOT 0.6  PROT 6.3*  ALBUMIN 4.0   No results for input(s): LIPASE, AMYLASE in the last 168 hours. No results for input(s): AMMONIA in the last 168 hours. CBC: Recent Labs  Lab 11/08/20 1427  WBC 5.4  NEUTROABS 2.9  HGB 12.2  HCT 37.0  MCV 88.7  PLT 207   Cardiac Enzymes: No results for input(s): CKTOTAL, CKMB, CKMBINDEX, TROPONINI in the last 168 hours.  BNP (last 3 results) No results  for input(s): BNP in the last 8760 hours.  ProBNP (last 3 results) No results for input(s): PROBNP in the last 8760 hours.  CBG: No results for input(s): GLUCAP in the last 168 hours.  Radiological Exams on Admission: CT Angio Head W or Wo Contrast  Result Date: 11/08/2020 CLINICAL DATA:  Episodes of confusion EXAM: CT ANGIOGRAPHY HEAD AND NECK TECHNIQUE: Multidetector CT imaging of the head and neck was performed using the standard protocol during bolus administration of intravenous contrast. Multiplanar CT image reconstructions and MIPs were obtained to evaluate the vascular anatomy. Carotid stenosis measurements (when applicable) are obtained utilizing NASCET criteria, using the distal internal carotid diameter as the denominator. CONTRAST:  69mL OMNIPAQUE IOHEXOL 350 MG/ML SOLN COMPARISON:  April 2020 FINDINGS: CTA NECK Aortic arch: Great vessel origins are patent. There is aberrant origin of the right subclavian artery, an anatomic variant. Right carotid system: Patent. Calcified plaque at the ICA origin causes less than 50% stenosis. Left carotid system: Patent. Calcified plaque at the ICA origin without stenosis. Vertebral arteries: Patent and codominant. Skeleton: Degenerative changes of the cervical spine. Other neck: Negative. Upper chest: Included  upper lungs are clear. Review of the MIP images confirms the above findings CTA HEAD Anterior circulation: Intracranial internal carotid arteries are patent with mild calcified plaque. Anterior cerebral arteries are patent. Left A1 ACA is dominant. Posterior circulation: Intracranial vertebral arteries and basilar artery are patent. Major cerebellar artery origins are patent. Posterior cerebral arteries are patent. Venous sinuses: Patent as allowed by contrast bolus timing. Review of the MIP images confirms the above findings IMPRESSION: No occlusion in the neck. Similar plaque at the right greater than left ICA origins with less than 50% stenosis. No proximal intracranial vessel occlusion. Electronically Signed   By: Macy Mis M.D.   On: 11/08/2020 16:40   CT Head Wo Contrast  Result Date: 11/08/2020 CLINICAL DATA:  Patient to ER for c/o confusion. Patient states she has had two episodes (first episode five days ago, second episode was yesterday) that she was reading a book and was unable to comprehend the words. EXAM: CT HEAD WITHOUT CONTRAST TECHNIQUE: Contiguous axial images were obtained from the base of the skull through the vertex without intravenous contrast. COMPARISON:  None. FINDINGS: Brain: No evidence of acute infarction, hemorrhage, hydrocephalus, extra-axial collection or mass lesion/mass effect. Generalized cerebral atrophy. Periventricular white matter low attenuation likely secondary to microangiopathy. Vascular: No hyperdense vessel or unexpected calcification. Skull: No osseous abnormality. Sinuses/Orbits: Visualized paranasal sinuses are clear. Visualized mastoid sinuses are clear. Visualized orbits demonstrate no focal abnormality. Other: None IMPRESSION: 1. No acute intracranial pathology. Electronically Signed   By: Kathreen Devoid   On: 11/08/2020 14:39   CT Angio Neck W and/or Wo Contrast  Result Date: 11/08/2020 CLINICAL DATA:  Episodes of confusion EXAM: CT ANGIOGRAPHY HEAD AND  NECK TECHNIQUE: Multidetector CT imaging of the head and neck was performed using the standard protocol during bolus administration of intravenous contrast. Multiplanar CT image reconstructions and MIPs were obtained to evaluate the vascular anatomy. Carotid stenosis measurements (when applicable) are obtained utilizing NASCET criteria, using the distal internal carotid diameter as the denominator. CONTRAST:  20mL OMNIPAQUE IOHEXOL 350 MG/ML SOLN COMPARISON:  April 2020 FINDINGS: CTA NECK Aortic arch: Great vessel origins are patent. There is aberrant origin of the right subclavian artery, an anatomic variant. Right carotid system: Patent. Calcified plaque at the ICA origin causes less than 50% stenosis. Left carotid system: Patent. Calcified plaque at the ICA origin  without stenosis. Vertebral arteries: Patent and codominant. Skeleton: Degenerative changes of the cervical spine. Other neck: Negative. Upper chest: Included upper lungs are clear. Review of the MIP images confirms the above findings CTA HEAD Anterior circulation: Intracranial internal carotid arteries are patent with mild calcified plaque. Anterior cerebral arteries are patent. Left A1 ACA is dominant. Posterior circulation: Intracranial vertebral arteries and basilar artery are patent. Major cerebellar artery origins are patent. Posterior cerebral arteries are patent. Venous sinuses: Patent as allowed by contrast bolus timing. Review of the MIP images confirms the above findings IMPRESSION: No occlusion in the neck. Similar plaque at the right greater than left ICA origins with less than 50% stenosis. No proximal intracranial vessel occlusion. Electronically Signed   By: Macy Mis M.D.   On: 11/08/2020 16:40   MR BRAIN WO CONTRAST  Result Date: 11/08/2020 CLINICAL DATA:  Neuro deficit, acute, stroke suspected. EXAM: MRI HEAD WITHOUT CONTRAST TECHNIQUE: Multiplanar, multiecho pulse sequences of the brain and surrounding structures were  obtained without intravenous contrast. COMPARISON:  CT angiogram head/neck performed earlier today 11/08/2020. Contrast head CT performed earlier today 11/08/2020. Brain MRI 01/17/2019. FINDINGS: Brain: Mild cerebral atrophy. Punctate acute infarct within the high left frontal lobe (series 5, image 40). New from the prior brain MRI of 01/17/2019, there is a small focus of extra-axial T2/FLAIR hyperintensity with corresponding restricted diffusion overlying the mid left frontal lobe (for instance as seen on series 15, image 49) (series 7, image 25). This is nonspecific but may reflect a trace extra-axial collection. Redemonstrated curvilinear SWI signal loss along the mid to posterior left frontal lobe compatible with chronic hemosiderin deposition from remote subarachnoid hemorrhage. Mild multifocal T2/FLAIR hyperintensity within the cerebral white matter is nonspecific, but compatible with chronic small vessel ischemic disease. Chronic small vessel ischemic changes are also present within the pons. No midline shift. Vascular: Expected proximal arterial flow voids.  No focal Skull and upper cervical spine: No focal marrow lesion. Sinuses/Orbits: Visualized orbits show no acute finding. Trace bilateral ethmoid sinus mucosal thickening. IMPRESSION: Punctate acute infarct within the high left frontal lobe. New from the prior brain MRI of 01/17/2019, there is a small focus of extra-axial T2/FLAIR hyperintensity with corresponding restricted diffusion overlying the mid left frontal lobe. This is nonspecific but may reflect a trace extra-axial collection. Consider post-contrast MR imaging of the brain for further characterization. Redemonstrated chronic hemosiderin deposition along the mid to posterior left frontal lobe compatible with sequela of remote subarachnoid hemorrhage. Mild chronic small vessel ischemic disease within the cerebral white matter and pons and mild cerebral atrophy, stable as compared to the brain  MRI of 01/17/2019. Electronically Signed   By: Kellie Simmering DO   On: 11/08/2020 18:01   MR BRAIN W CONTRAST  Result Date: 11/08/2020 CLINICAL DATA:  Left frontal infarct. Possible small extra-axial collection. EXAM: MRI HEAD WITH CONTRAST TECHNIQUE: Multiplanar, multiecho pulse sequences of the brain and surrounding structures were obtained with intravenous contrast. CONTRAST:  71mL GADAVIST GADOBUTROL 1 MMOL/ML IV SOLN COMPARISON:  Brain MRI without contrast 11/08/2020 FINDINGS: There is no abnormal contrast enhancement. No extra-axial collection. Otherwise, the examination is unchanged compared to the earlier brain MRI without contrast. IMPRESSION: No extra-axial collection or abnormal contrast enhancement. Electronically Signed   By: Ulyses Jarred M.D.   On: 11/08/2020 20:52    EKG: I independently viewed the EKG done and my findings are as followed: Sinus rhythm rate of 67, nonspecific ST-T changes.  QTc 442.  Assessment/Plan Present on Admission: .  Acute CVA (cerebrovascular accident) [facility])  Active Problems:   Acute CVA (cerebrovascular accident) ([place])  Acute CVA Presented with transient difficulty comprehending words when she was reading today, now resolved. MRI brain revealed punctate acute infarct within the high left frontal lobe.  Also revealed: Redemonstrated chronic hemosiderin deposition along the mid to posterior left frontal lobe compatible with sequela of remote subarachnoid hemorrhage. Seen by neurology/stroke team.  Recommended daily aspirin 81 mg High intensity statin added. Ongoing stroke work-up Get fasting lipid panel/A1c 2D echo with bubble study PT/OT/speech evaluation Frequent neuro checks Telemetry monitoring  Mild hypovolemic hyponatremia Serum sodium 132 Gentle IV fluid hydration normal saline at 50 cc/h x 1 day. Repeat chemistry panel in the morning.  Hypothyroidism TSH Resume home levothyroxine.  Mnire's disease/GERD/IBS Stable Resume home  regimen.   DVT prophylaxis: SCDs.  Code Status: Full code.  Family Communication: None at bedside.  Disposition Plan: Admit to MedSurg unit with remote telemetry.  Consults called: Neurology/stroke team.  Admission status: Inpatient status.  Patient will require at least 2 midnights for further evaluation and treatment of present condition.   Status is: Inpatient    Dispo:  Patient From: Home  Planned Disposition: Home  Anticipated date of discharge 11/10/2020 or when neurology signs off.  Medically stable for discharge: No, ongoing management of acute stroke.         Kayleen Memos MD [facility] Pager [phone]  If 7PM-7AM, please contact night-coverage [email or web address] Password [facility]  11/08/2020, 10:19 PM

## 2020-11-08 NOTE — ED Notes (Signed)
Pt provided warm blanket. Pt reports she is going to bed. Bed in low, locked position. Call bell in reach. Denies other needs

## 2020-11-08 NOTE — Telephone Encounter (Signed)
Aware, I am out of the office and will watch for correspondence

## 2020-11-08 NOTE — ED Notes (Signed)
Pt transported to CT at this time.

## 2020-11-08 NOTE — ED Notes (Signed)
Pt back from MRI 

## 2020-11-08 NOTE — Telephone Encounter (Signed)
Patient has appointment with Dr.Duncan on 11/09/20.

## 2020-11-08 NOTE — ED Notes (Signed)
Report received from Ashley RN.

## 2020-11-08 NOTE — Telephone Encounter (Signed)
Per chart review tab pt is presently at [facility] ED. Thank you.

## 2020-11-08 NOTE — ED Notes (Signed)
Pharmacy sending another ASA tablet, also made aware that there are medications that have not been verified

## 2020-11-09 ENCOUNTER — Ambulatory Visit: Payer: Medicare HMO | Admitting: Family Medicine

## 2020-11-09 ENCOUNTER — Inpatient Hospital Stay
Admit: 2020-11-09 | Discharge: 2020-11-09 | Disposition: A | Discharge status: Home / Self Care | Payer: Medicare HMO | Attending: Internal Medicine | Admitting: Internal Medicine

## 2020-11-09 DIAGNOSIS — I639 Cerebral infarction, unspecified: Secondary | ICD-10-CM

## 2020-11-09 DIAGNOSIS — G459 Transient cerebral ischemic attack, unspecified: Secondary | ICD-10-CM

## 2020-11-09 HISTORY — DX: Transient cerebral ischemic attack, unspecified: G45.9

## 2020-11-09 LAB — HEMOGLOBIN A1C
Hgb A1c MFr Bld: 5.7 % — ABNORMAL HIGH (ref 4.8–5.6)
Mean Plasma Glucose: 116.89 mg/dL

## 2020-11-09 LAB — ECHOCARDIOGRAM COMPLETE BUBBLE STUDY
AR max vel: 2.67 cm2
AV Area VTI: 2.55 cm2
AV Area mean vel: 2.66 cm2
AV Mean grad: 4 mmHg
AV Peak grad: 7.3 mmHg
Ao pk vel: 1.35 m/s
Area-P 1/2: 7.22 cm2
MV VTI: 2.82 cm2
S' Lateral: 2.7 cm

## 2020-11-09 LAB — BASIC METABOLIC PANEL
Anion gap: 6 (ref 5–15)
BUN: 16 mg/dL (ref 8–23)
CO2: 30 mmol/L (ref 22–32)
Calcium: 9.6 mg/dL (ref 8.9–10.3)
Chloride: 103 mmol/L (ref 98–111)
Creatinine, Ser: 0.42 mg/dL — ABNORMAL LOW (ref 0.44–1.00)
GFR, Estimated: >60 mL/min (ref >60)
Glucose, Bld: 101 mg/dL — ABNORMAL HIGH (ref 70–99)
Potassium: 3.9 mmol/L (ref 3.5–5.1)
Sodium: 139 mmol/L (ref 135–145)

## 2020-11-09 LAB — CBC
HCT: 42.5 % (ref 36.0–46.0)
Hemoglobin: 14.1 g/dL (ref 12.0–15.0)
MCH: 29.4 pg (ref 26.0–34.0)
MCHC: 33.2 g/dL (ref 30.0–36.0)
MCV: 88.5 fL (ref 80.0–100.0)
Platelets: 244 10*3/uL (ref 150–400)
RBC: 4.8 MIL/uL (ref 3.87–5.11)
RDW: 13.3 % (ref 11.5–15.5)
WBC: 7.5 10*3/uL (ref 4.0–10.5)
nRBC: 0 % (ref 0.0–0.2)

## 2020-11-09 LAB — LIPID PANEL
Cholesterol: 219 mg/dL — ABNORMAL HIGH (ref 0–200)
HDL: 97 mg/dL (ref >40)
LDL Cholesterol: 109 mg/dL — ABNORMAL HIGH (ref 0–99)
Total CHOL/HDL Ratio: 2.3 RATIO
Triglycerides: 67 mg/dL (ref <150)
VLDL: 13 mg/dL (ref 0–40)

## 2020-11-09 LAB — SARS CORONAVIRUS 2 (TAT 6-24 HRS): SARS Coronavirus 2: NEGATIVE

## 2020-11-09 LAB — TSH: TSH: 2.921 u[IU]/mL (ref 0.350–4.500)

## 2020-11-09 MED ORDER — ACETAMINOPHEN 325 MG PO TABS
650.0000 mg | ORAL_TABLET | ORAL | Status: DC | PRN
  Filled 2020-11-09: qty 2

## 2020-11-09 MED ORDER — ATORVASTATIN CALCIUM 20 MG PO TABS: 40.0000 mg | ORAL_TABLET | Freq: Every day | ORAL | Status: DC

## 2020-11-09 MED ORDER — CLOPIDOGREL BISULFATE 75 MG PO TABS: 75.0000 mg | ORAL_TABLET | Freq: Every day | ORAL | 0 refills | Status: DC

## 2020-11-09 MED ORDER — HYDRALAZINE HCL 50 MG PO TABS
25.0000 mg | ORAL_TABLET | Freq: Once | ORAL | Status: AC
  Administered 2020-11-09: 25 mg via ORAL
  Filled 2020-11-09: qty 1

## 2020-11-09 MED ORDER — CLOPIDOGREL BISULFATE 75 MG PO TABS: 75.0000 mg | ORAL_TABLET | Freq: Every day | ORAL | Status: DC

## 2020-11-09 MED ORDER — ATORVASTATIN CALCIUM 40 MG PO TABS: 40.0000 mg | ORAL_TABLET | Freq: Every day | ORAL | 3 refills | Status: DC

## 2020-11-09 MED ORDER — ASPIRIN 81 MG PO TBEC: 81.0000 mg | DELAYED_RELEASE_TABLET | Freq: Every day | ORAL | 11 refills | Status: DC

## 2020-11-09 MED ORDER — CLOPIDOGREL BISULFATE 75 MG PO TABS
300.0000 mg | ORAL_TABLET | Freq: Once | ORAL | Status: AC
  Administered 2020-11-09: 300 mg via ORAL
  Filled 2020-11-09: qty 4

## 2020-11-09 NOTE — Care Management CC44 (Signed)
Condition Code 44 Documentation Completed  Patient Details  Name: Sarah Harper MRN: [id number] Date of Birth: [birth year]   Condition Code 44 given:  Yes Patient signature on Condition Code 44 notice:  Yes Documentation of 2 MD's agreement:  Yes Code 44 added to claim:  Yes    Erenest Rasher, RN 11/09/2020, 6:48 PM

## 2020-11-09 NOTE — Evaluation (Signed)
Physical Therapy Evaluation Patient Details Name: Sarah Harper MRN: [id number] DOB: 06-Dec-1940 Today's Date: 11/09/2020   History of Present Illness  Patient is a 80 year old femlae with  history of Mnire's disease, IBS, osteoporosis, OSA on CPAP, hypothyroidism, GERD. Presented with transient difficulty comprehending words when she was reading, now resolved. MRI brain revealed punctate acute infarct within the high left frontal lobe and redemonstrated chronic hemosiderin deposition along the mid to posterior left frontal lobe compatible with sequela of remote subarachnoid hemorrhage.    Clinical Impression  Patient standing up in the room on arrival, dressed with clothes and shoes on. Patient states she wants to be discharged this morning if possible. No difficulty with word finding or reading out loud during functional activity. Patient is independent with all activity. No focal weakness noted on exam in extremities. Patient ambulated around the unit without difficulty without assistive device and demonstrated no loss of balance with high level balance activity. No further acute PT needs at this time. Patient appears to be at baseline level of functional mobility.      Follow Up Recommendations No PT follow up    Equipment Recommendations  None recommended by PT    Recommendations for Other Services       Precautions / Restrictions Precautions Precautions: None Restrictions Weight Bearing Restrictions: No      Mobility  Bed Mobility               General bed mobility comments: not assessed as patient sitting up on arrival and post session. presumably patient is independent with bed mobility    Transfers Overall transfer level: Independent                  Ambulation/Gait Ambulation/Gait assistance: Independent Gait Distance (Feet): 200 Feet Assistive device: None Gait Pattern/deviations: WFL(Within Functional Limits) Gait velocity: normal   General  Gait Details: patient ambulated around the unit without difficulty. no loss of balance with head turns while ambulating or reaching outside base of support  Stairs            Wheelchair Mobility    Modified Rankin (Stroke Patients Only)       Balance Overall balance assessment: Independent                           High level balance activites: Turns;Head turns;Direction changes (tandem walking, heel lifting in standing, tow lifting in standing) High Level Balance Comments: no loss of balance when challenged with high level balance activities             Pertinent Vitals/Pain Pain Assessment: 0-10 Pain Score: 5  Pain Location: low back Pain Descriptors / Indicators: Discomfort ("chronic") Pain Intervention(s): Limited activity within patient's tolerance    Home Living Family/patient expects to be discharged to:: Private residence Living Arrangements: Spouse/significant other Available Help at Discharge: Family;Available 24 hours/day Type of Home: House                Prior Function Level of Independence: Independent               Hand Dominance   Dominant Hand: Right    Extremity/Trunk Assessment   Upper Extremity Assessment Upper Extremity Assessment: Overall WFL for tasks assessed    Lower Extremity Assessment Lower Extremity Assessment: Overall WFL for tasks assessed       Communication   Communication: No difficulties  Cognition Arousal/Alertness: Awake/alert Behavior During Therapy: WFL for  tasks assessed/performed Overall Cognitive Status: Within Functional Limits for tasks assessed                                        General Comments      Exercises     Assessment/Plan    PT Assessment Patent does not need any further PT services  PT Problem List         PT Treatment Interventions      PT Goals (Current goals can be found in the Care Plan section)       Frequency     Barriers to  discharge        Co-evaluation               AM-PAC PT "6 Clicks" Mobility  Outcome Measure Help needed turning from your back to your side while in a flat bed without using bedrails?: None Help needed moving from lying on your back to sitting on the side of a flat bed without using bedrails?: None Help needed moving to and from a bed to a chair (including a wheelchair)?: None Help needed standing up from a chair using your arms (e.g., wheelchair or bedside chair)?: None Help needed to walk in hospital room?: None Help needed climbing 3-5 steps with a railing? : None 6 Click Score: 24    End of Session   Activity Tolerance: Patient tolerated treatment well Patient left: with family/visitor present (sitting on edge of stretcher) Nurse Communication: Mobility status PT Visit Diagnosis: Other symptoms and signs involving the nervous system (R29.898)    Time: 1610-9604 PT Time Calculation (min) (ACUTE ONLY): 15 min   Charges:   PT Evaluation $PT Eval Low Complexity: 1 Low PT Treatments $Neuromuscular Re-education: 8-22 mins       Sarah Harper, PT, MPT   Sarah Harper 11/09/2020, 8:57 AM

## 2020-11-09 NOTE — Progress Notes (Signed)
Neurology Progress Note  Patient ID: Sarah Harper is a 80 y.o. with PMHx of  has a past medical history of DDD (degenerative disc disease), ETD (eustachian tube dysfunction), GERD (gastroesophageal reflux disease), IBS (irritable bowel syndrome), Meniere's disease, Migraine, Osteoporosis, Sleep apnea, Syncopal episodes, and Thyroid disease.  Initially consulted for: Transient reading incomprehension  Major interval events/Subjective: - Eager to go home; not sleeping well in ED bed  - No new neurological complaints   Exam: Vitals:   11/09/20 0423 11/09/20 0832  BP: (!) 141/100 (!) 164/87  Pulse: 66 89  Resp: 15 16  Temp:  98 F (36.7 C)  SpO2: 98% 100%   Gen: In bed, comfortable  Resp: non-labored breathing, no grossly audible wheezing Cardiac: Perfusing extremities well  Abd: soft, nt  Neuro: MS: Awake, alert, oriented to place/situation/time CN: Tracking examiner in all visual fields, EOMI, face symmetric, tongue midline Motor/gait: Using all 4 extremities equally in casual movements, ambulating around the room with eagerness to go home Sensory: No new sensory complaints, intact to light touch in all 4 extremities  Pertinent Labs: Hemoglobin A1c 5.7% Lab Results  Component Value Date   CHOL 219 (H) 11/09/2020   HDL 97 11/09/2020   LDLCALC 109 (H) 11/09/2020   LDLDIRECT 115.5 02/12/2013   TRIG 67 11/09/2020   CHOLHDL 2.3 11/09/2020    MRI brain personally reviewed, punctate left frontal infarct.  There was a question of extra-axial collection on initial MRI without contrast which was not demonstrated on MRI brain with and without contrast, suggestive of artifact.  Both MRIs were personally reviewed by me  Prior pertinent work-up: CTA without flow-limiting stenosis or large vessel occlusion, though there is some mild plaque at the bilateral internal carotid origins and other minor scattered atherosclerosis  Impression: This is a 80 year old woman with minimal vascular  risk factors presenting with 2 episodes of reading impairment with a punctate infarct on MRI brain in the left frontal cortex suggesting that these were indeed ischemic events of the left MCA territory.  We will start dual antiplatelet therapy for 21 days, followed by aspirin monotherapy lifelong as well as starting atorvastatin for her elevated LDL.  If there is evidence of structural cardiac abnormality or intracardiac thrombus echocardiogram, further medication changes may need to be made and an event monitor may be helpful, but if echocardiogram is reassuring, patient is stable for discharge.  Imaging was reviewed with the patient, importance of diet, exercise, and medication compliance were reviewed.  All of her questions were answered.  Recommendations: -Plavix 300 mg once followed by 75 mg daily for 21 days -Continue aspirin 81 mg lifelong -Atorvastatin 40 mg nightly -Patient ready for discharge if ECHO read reassuring -Outpatient neurology follow-up with Dr. Sula Soda MD-PhD [facility] [phone]   32 minutes were spent in the care of this patient today given review of the imaging, and discussion with the patient as above.  More than 50% of this time was in direct patient contact

## 2020-11-09 NOTE — Telephone Encounter (Signed)
Appt cancelled

## 2020-11-09 NOTE — Telephone Encounter (Signed)
According to EMR, currently inpatient so reasonable to cancel her appointment here today.

## 2020-11-09 NOTE — Progress Notes (Signed)
OT Cancellation Note  Patient Details Name: Sarah Harper MRN: [id number] DOB: 1940-08-31   Cancelled Treatment:    Reason Eval/Treat Not Completed: OT screened, no needs identified, will sign off. Order received and chart reviewed. Per conversation with RN, PT, patient, and family - pt back to baseline functional independence. All education completed including decreasing stress as a stroke risk factor. No skilled OT needs identified. Will sign off. Please re-consult if additional needs arise.   Dessie Coma, M.S. OTR/L  11/09/20, 9:50 AM  ascom [phone]

## 2020-11-09 NOTE — Care Management Obs Status (Signed)
[facility] NOTIFICATION   Patient Details  Name: Sarah Harper MRN: [id number] Date of Birth: [birth year]   Medicare Observation Status Notification Given:  Yes    Erenest Rasher, RN 11/09/2020, 6:48 PM

## 2020-11-09 NOTE — Evaluation (Addendum)
Clinical/Bedside Swallow Evaluation Patient Details  Name: Sarah Harper MRN: [id number] Date of Birth: March 22, 1941  Today's Date: 11/09/2020 Time: SLP Start Time (ACUTE ONLY): 1205 SLP Stop Time (ACUTE ONLY): 1255 SLP Time Calculation (min) (ACUTE ONLY): 50 min  Past Medical History:  Past Medical History:  Diagnosis Date  . DDD (degenerative disc disease)    chronic back pain  . ETD (eustachian tube dysfunction)   . GERD (gastroesophageal reflux disease)   . IBS (irritable bowel syndrome)   . Meniere's disease   . Migraine    still gets visual aura from time to time  . Osteoporosis   . Sleep apnea    CPAP  . Syncopal episodes    after work-up - possible seizures?  . Thyroid disease    hypothyroid   Past Surgical History:  Past Surgical History:  Procedure Laterality Date  . APPENDECTOMY    . CATARACT EXTRACTION W/PHACO Right 05/21/2019   Procedure: CATARACT EXTRACTION PHACO AND INTRAOCULAR LENS PLACEMENT ([place]) RIGHT panoptix lens  00:35.0  21.7%  7.61;  Surgeon: Leandrew Koyanagi, MD;  Location: [facility];  Service: Ophthalmology;  Laterality: Right;  sleep apnea requests early  . CATARACT EXTRACTION W/PHACO Left 06/11/2019   Procedure: CATARACT EXTRACTION PHACO AND INTRAOCULAR LENS PLACEMENT ([facility]) LEFT PANOPTIX TORIC LENS  00:50.0  20.3%  10.21;  Surgeon: Leandrew Koyanagi, MD;  [facility];  Service: Ophthalmology;  Laterality: Left;  sleep apnea requests early  . COLONOSCOPY     last 2012 - [facility]  . ESOPHAGOGASTRODUODENOSCOPY     Multiple, last 01/07/2017 with Savary dilation to 18 mm  . HEMORRHOID BANDING  2018   [facility]  . LUMBAR DISC SURGERY  1984   L5    HPI:  Pt is a 80 y.o. female with a past medical history significant for degenerative disc disease, irritable bowel syndrome, Mnire's disease, migraine headaches, osteoporosis, sleep apnea on CPAP, hypothyroidism.     She reports she was in her usual state of health when she  had an episode of difficulty reading last Thursday or Friday evening.  This lasted about 15 to 20 minutes.  She was alone and did not try to speak at the time.  She denied having difficulty seeing, but could not parse the words that she was trying to read.  Shortly after the episode she had a severe headache (6 out of 10 in intensity compared to 10/10 intensity migraines that she is to have prior to menopause).  This was a bifrontal headache and was relieved by going to sleep.  She had a recurrent episode on Sunday night that was very similar, but this time she took half a tablet of Fioricet for the headache.  MRI this admit revealed: "Punctate acute infarct within the high left frontal lobe; chronic hemosiderin deposition along the mid to posterior left frontal lobe compatible with sequela of remote subarachnoid hemorrhage; Mild chronic small vessel ischemic disease within the cerebral white matter and pons and mild cerebral atrophy". Neurology is following.  Assessment / Plan / Recommendation Clinical Impression  Pt appears to present w/ adequate oropharyngeal phase swallow function w/ No consistent oropharyngeal phase dysphagia noted; No neuromuscular deficits noted. Pt consumed po trials w/ no consistent, overt clinical s/s of aspiration during po trials. She appears at reduced risk for aspiration following general aspiration precautions including Small sips of thin liquids Via Cup(less straw use). Per chart notes in 2020, pt had a MBSS revealing: "small bilateral outpouching in lateral pharyngeal  walls where liquids and solids accumulate during the swallow (often known as pharyngeal ears) and spill and pool in pyriforms post swallow which pt senses and clears spontaneously w/ f/u swallow" but otherwise functional swallowing w/ recommendation for a Regular diet w/ thin liquids. Pt is aware of need to eat/drink carefully per her report. She stated that w/ "some" foods/solids, she experiences a piece of the  food "slips back" and chokes her. This is not consistent, and she is unsure of which foods cause this. Thoroughly discussed w/ her aspects of her swallowing including her previous MBSS -- how to better monitor any issues w/ certain foods as she said "sometimes they can slip back and choke me". She is going to keep a food diary re: these occurrences and what surrounds them, especially monitoring foods of mixed consistency.  During po trials, pt consumed all consistencies w/ no immediate coughing, decline in vocal quality, or change in respiratory presentation during/post trials. She exhibited an immediate throat clearing/cough x1 when drinking thin liquids which she stated were "too cold" -- ice in the drink. W/ further trials of juice, no further clinical s/s of aspiration noted. All thin liquids trials were Via CUP. Oral phase appeared [facility] w/ timely bolus management, mastication, and control of bolus propulsion for A-P transfer for swallowing. Oral clearing achieved w/ all trial consistencies. OM Exam appeared [facility] w/ no unilateral weakness noted. Speech Clear, mild+ Dysphonia. Pt fed self.   Recommend a Regular consistency diet w/ Cut meats, moistened foods; Thin liquids VIA CUP. Discussed less Straw use d/t inhalation when drinking and less bolus control. Recommend general aspiration precautions, Pills WHOLE in Puree for safer, easier swallowing if beneficial for pt. Education given on Pills in Puree; food consistencies and easy to eat options; general aspiration precautions. NSG to reconsult if any new needs arise. MD updated.   Of note, during session, a presentation of possible muscle tension dysphonia w/ reduced breath support during conversation was noted. She endorsed it had been ongoing for "years". Pt also c/o "not remembering some words" during conversation at times -- though this first occurred "early last week" though she conversed adequately during our session; but, pt was not overly challenged. She  stated "it's worse when I am nervous". Pt stated she would interested in having Speech Therapy f/u at [facility] for a formal Speech-language Evaluation w/ Voice Evaluation to further discuss issues and possibly learn strategies to maximize her communication w/ others. MD updated and agreed. SLP Visit Diagnosis: Dysphagia, unspecified (R13.10)    Aspiration Risk   (reduced following precautions)    Diet Recommendation  Regular diet w/ thin liquids VIA CUP; general aspiration precautions.   Medication Administration: Whole meds with puree (if beneficial for pt when swallowing Pills)    Other  Recommendations Oral Care Recommendations: Oral care BID;Patient independent with oral care Other Recommendations:  (n/a)   Follow up Recommendations None      Frequency and Duration  (n/a)   (n/a)       Prognosis Prognosis for Safe Diet Advancement: Good Barriers to Reach Goals: Time post onset (baseline per MBSS in 2020)      Swallow Study   General Date of Onset: 11/08/20 HPI: Pt is a 80 y.o. female with a past medical history significant for degenerative disc disease, irritable bowel syndrome, Mnire's disease, migraine headaches, osteoporosis, sleep apnea on CPAP, hypothyroidism.     She reports she was in her usual state of health when she had an episode of  difficulty reading last Thursday or Friday evening.  This lasted about 15 to 20 minutes.  She was alone and did not try to speak at the time.  She denied having difficulty seeing, but could not parse the words that she was trying to read.  Shortly after the episode she had a severe headache (6 out of 10 in intensity compared to 10/10 intensity migraines that she is to have prior to menopause).  This was a bifrontal headache and was relieved by going to sleep.  She had a recurrent episode on Sunday night that was very similar, but this time she took half a tablet of Fioricet for the headache. Type of Study: Bedside Swallow Evaluation Previous  Swallow Assessment: MBSS 2020 -- regular diet w/ thin liquids Diet Prior to this Study: Regular;Thin liquids Temperature Spikes Noted: No (wbc 7.5) Respiratory Status: Room air History of Recent Intubation: No Behavior/Cognition: Alert;Cooperative;Pleasant mood (mild+ Dysphonia (muscle tension?)) Oral Cavity Assessment: Within Functional Limits Oral Care Completed by SLP: Recent completion by staff Oral Cavity - Dentition: Adequate natural dentition Vision: Functional for self-feeding Self-Feeding Abilities: Able to feed self;Needs set up (given d/t being on stretcher) Patient Positioning: Upright in bed (positioned fully upright) Baseline Vocal Quality:  (possible mild+ muscle tension dysphonia; reduced support) Volitional Cough: Strong Volitional Swallow: Able to elicit    Oral/Motor/Sensory Function Overall Oral Motor/Sensory Function: Within functional limits   Ice Chips Ice chips: Not tested   Thin Liquid Thin Liquid: Within functional limits Presentation: Cup;Self Fed (8 trials)    Nectar Thick Nectar Thick Liquid: Not tested   Honey Thick Honey Thick Liquid: Not tested   Puree Puree: Within functional limits Presentation: Spoon;Self Fed (5 trials)   Solid     Solid: Within functional limits Presentation: Self Fed (4 trials)        Orinda Kenner, MS, [place] [facility] [phone] Dayzee Trower 11/09/2020,4:51 PM

## 2020-11-09 NOTE — ED Notes (Signed)
Discussed with Dr. Sabino Gasser, okay to discharge patient to home. Have her follow up with PCP closely. Discussed this with patient and she verbalized importance of close follow up.

## 2020-11-09 NOTE — ED Notes (Signed)
Reviewed code 1 documents with patient and husband, they verbalized understanding. Phone number for Edwin Cap provided for any further questions they may have.

## 2020-11-09 NOTE — Progress Notes (Signed)
*  PRELIMINARY RESULTS* Echocardiogram 2D Echocardiogram has been performed.  Sarah Harper 11/09/2020, 1:34 PM

## 2020-11-09 NOTE — Progress Notes (Signed)
Medicare Outpatient Observation Notice  Patient name:  Sarah Harper Patient number:  [id number]                                                                                                                                                                       You're a hospital outpatient receiving observation services. You are not an inpatient because:   you did not meet Medicare guidelines for Inpatient Status, you did not have 2 night qualifying  Patient status is changed from inpatient to observation (outpatient)status as indicated under the Medicare Condition Code-44 Regulations, Chapter 100-04 of the Medicare Claims Processing Manual 50.3. Date of Status Change 11/09/2020 6:41 PM                                                                                                                                                                       Being an outpatient may affect what you pay in a hospital:   When you're a hospital outpatient, your observation stay is covered under Medicare Part B.   For Part B services, you generally pay:  ? A copayment for each outpatient hospital service you get. Part B copayments may vary by type of service.  ? 20% of the Medicare-approved amount for most doctor services, after the Part B deductible.  Observation services may affect coverage and payment of your care after you leave the hospital:     If you need skilled nursing facility (SNF) care after you leave the hospital, Medicare Part A will only cover SNF care if you've had a 3-day minimum, medically necessary, inpatient hospital stay for a related illness or injury. An inpatient hospital stay begins the day the hospital admits you as an inpatient based on a doctor's order and doesn't include the day you're discharged.   If you have Medicaid, a Medicare Advantage plan or other health plan, Medicaid or the plan may have different rules for SNF coverage after you leave the  hospital. Check  with Medicaid or your plan.  NOTE: Medicare Part A generally doesn't cover outpatient hospital services, like an observation stay. However, Part A will generally cover medically necessary inpatient services if the hospital admits you as an inpatient based on a doctor's order. In most cases, you'll pay a one-time deductible for all of your inpatient hospital services for the first 60 days you're in a hospital.                                                                                                                                                                      If you have any questions about your observation services, ask the hospital staff member giving you this notice or the doctor providing your hospital care. You can also ask to speak with someone from the hospital's utilization or discharge planning department.  You can also call 1-800-MEDICARE (1-701-077-1162).  TT[employer] users should call 3438111596.  Form CMS [id number]  Expiration 08/20/2021 OMB APPROVAL [id number]       Your costs for medications:    Generally, prescription and over-the-counter drugs, including "self-administered drugs," you get in a hospital outpatient setting (like an emergency department) aren't covered by Part B. "Self- administered drugs" are drugs you'd normally take on your own. For safety reasons, many hospitals don't allow you to take medications brought from home. If you have a Medicare prescription drug plan (Part D), your plan may help you pay for these drugs. You'll likely need to pay out-of- pocket for these drugs and submit a claim to your drug plan for a refund. Contact your drug plan for more information.                                                                                                                                                                       If you're enrolled in a Medicare Advantage plan (like an HMO or PPO) or other Medicare health plan  (Part C), your costs and coverage may be different. Check  with your plan to find out about coverage for outpatient observation services.   If you're a Qualified Medicare Beneficiary through your state Medicaid program, you can't be billed for Part A or Part B deductibles, coinsurance, and copayments.                                                                                                                                                                      Additional Information (Optional):  TOC CM spoke to patient and gave Code 44, explained Code 44. Explained she will follow up with her insurance plan. Provided patient with copy of Code 44.                                                                                                                                                                          Please sign below to show you received and understand this notice.                                          Date: 11/09/20 / Time:6:39 PM  CMS does not discriminate in its programs and activities. To request this publication in alternative format, please call: 1-800-MEDICARE or email:[email or web address].  Form CMS [id number]  Expiration 08/20/2021 OMB APPROVAL [id number]      Patient                 Connected Tablet              Trace   Slow   Corrupt  Edit Data   Change Template

## 2020-11-09 NOTE — ED Notes (Signed)
Pt ambulated to restroom without assistance at this time ?

## 2020-11-09 NOTE — ED Notes (Addendum)
Pt ambulatory to bedside toilet at this time with steady gait

## 2020-11-10 ENCOUNTER — Emergency Department: Payer: Medicare HMO

## 2020-11-10 ENCOUNTER — Other Ambulatory Visit: Payer: Self-pay

## 2020-11-10 ENCOUNTER — Observation Stay
Admission: EM | Admit: 2020-11-10 | Discharge: 2020-11-11 | Disposition: A | Discharge status: Home / Self Care | Payer: Medicare HMO | Attending: Internal Medicine | Admitting: Internal Medicine

## 2020-11-10 DIAGNOSIS — R4182 Altered mental status, unspecified: Secondary | ICD-10-CM

## 2020-11-10 DIAGNOSIS — Z20822 Contact with and (suspected) exposure to covid-19: Secondary | ICD-10-CM | POA: Insufficient documentation

## 2020-11-10 DIAGNOSIS — R002 Palpitations: Secondary | ICD-10-CM | POA: Diagnosis present

## 2020-11-10 DIAGNOSIS — G4733 Obstructive sleep apnea (adult) (pediatric): Secondary | ICD-10-CM | POA: Insufficient documentation

## 2020-11-10 DIAGNOSIS — E876 Hypokalemia: Secondary | ICD-10-CM | POA: Diagnosis not present

## 2020-11-10 DIAGNOSIS — K219 Gastro-esophageal reflux disease without esophagitis: Secondary | ICD-10-CM | POA: Diagnosis not present

## 2020-11-10 DIAGNOSIS — R000 Tachycardia, unspecified: Secondary | ICD-10-CM | POA: Insufficient documentation

## 2020-11-10 DIAGNOSIS — I160 Hypertensive urgency: Secondary | ICD-10-CM | POA: Diagnosis present

## 2020-11-10 DIAGNOSIS — E785 Hyperlipidemia, unspecified: Secondary | ICD-10-CM | POA: Diagnosis present

## 2020-11-10 DIAGNOSIS — Z8673 Personal history of transient ischemic attack (TIA), and cerebral infarction without residual deficits: Secondary | ICD-10-CM | POA: Diagnosis present

## 2020-11-10 DIAGNOSIS — Z7982 Long term (current) use of aspirin: Secondary | ICD-10-CM | POA: Diagnosis not present

## 2020-11-10 DIAGNOSIS — G9341 Metabolic encephalopathy: Secondary | ICD-10-CM | POA: Diagnosis not present

## 2020-11-10 DIAGNOSIS — K589 Irritable bowel syndrome without diarrhea: Secondary | ICD-10-CM | POA: Insufficient documentation

## 2020-11-10 DIAGNOSIS — F419 Anxiety disorder, unspecified: Secondary | ICD-10-CM | POA: Diagnosis not present

## 2020-11-10 DIAGNOSIS — G43909 Migraine, unspecified, not intractable, without status migrainosus: Secondary | ICD-10-CM | POA: Diagnosis not present

## 2020-11-10 DIAGNOSIS — G9340 Encephalopathy, unspecified: Secondary | ICD-10-CM | POA: Diagnosis not present

## 2020-11-10 DIAGNOSIS — Z8249 Family history of ischemic heart disease and other diseases of the circulatory system: Secondary | ICD-10-CM | POA: Insufficient documentation

## 2020-11-10 DIAGNOSIS — R0789 Other chest pain: Secondary | ICD-10-CM | POA: Diagnosis present

## 2020-11-10 DIAGNOSIS — Z79899 Other long term (current) drug therapy: Secondary | ICD-10-CM | POA: Diagnosis not present

## 2020-11-10 DIAGNOSIS — E039 Hypothyroidism, unspecified: Secondary | ICD-10-CM | POA: Diagnosis present

## 2020-11-10 DIAGNOSIS — I10 Essential (primary) hypertension: Secondary | ICD-10-CM | POA: Insufficient documentation

## 2020-11-10 DIAGNOSIS — I639 Cerebral infarction, unspecified: Secondary | ICD-10-CM | POA: Diagnosis present

## 2020-11-10 LAB — CBC WITH DIFFERENTIAL/PLATELET
Abs Immature Granulocytes: 0.04 10*3/uL (ref 0.00–0.07)
Basophils Absolute: 0.1 10*3/uL (ref 0.0–0.1)
Basophils Relative: 1 %
Eosinophils Absolute: 0.2 10*3/uL (ref 0.0–0.5)
Eosinophils Relative: 3 %
HCT: 41.1 % (ref 36.0–46.0)
Hemoglobin: 13.8 g/dL (ref 12.0–15.0)
Immature Granulocytes: 1 %
Lymphocytes Relative: 20 %
Lymphs Abs: 1.5 10*3/uL (ref 0.7–4.0)
MCH: 29.7 pg (ref 26.0–34.0)
MCHC: 33.6 g/dL (ref 30.0–36.0)
MCV: 88.6 fL (ref 80.0–100.0)
Monocytes Absolute: 0.7 10*3/uL (ref 0.1–1.0)
Monocytes Relative: 9 %
Neutro Abs: 4.9 10*3/uL (ref 1.7–7.7)
Neutrophils Relative %: 66 %
Platelets: 232 10*3/uL (ref 150–400)
RBC: 4.64 MIL/uL (ref 3.87–5.11)
RDW: 13.6 % (ref 11.5–15.5)
WBC: 7.3 10*3/uL (ref 4.0–10.5)
nRBC: 0 % (ref 0.0–0.2)

## 2020-11-10 LAB — MAGNESIUM: Magnesium: 1.8 mg/dL (ref 1.7–2.4)

## 2020-11-10 LAB — URINALYSIS, COMPLETE (UACMP) WITH MICROSCOPIC
Bacteria, UA: NONE SEEN
Bilirubin Urine: NEGATIVE
Glucose, UA: NEGATIVE mg/dL
Hgb urine dipstick: NEGATIVE
Ketones, ur: NEGATIVE mg/dL
Leukocytes,Ua: NEGATIVE
Nitrite: NEGATIVE
Protein, ur: NEGATIVE mg/dL
Specific Gravity, Urine: 1.003 — ABNORMAL LOW (ref 1.005–1.030)
pH: 8 (ref 5.0–8.0)

## 2020-11-10 LAB — COMPREHENSIVE METABOLIC PANEL
ALT: 20 U/L (ref 0–44)
AST: 27 U/L (ref 15–41)
Albumin: 4.7 g/dL (ref 3.5–5.0)
Alkaline Phosphatase: 82 U/L (ref 38–126)
Anion gap: 9 (ref 5–15)
BUN: 15 mg/dL (ref 8–23)
CO2: 25 mmol/L (ref 22–32)
Calcium: 9.7 mg/dL (ref 8.9–10.3)
Chloride: 102 mmol/L (ref 98–111)
Creatinine, Ser: 0.59 mg/dL (ref 0.44–1.00)
GFR, Estimated: >60 mL/min (ref >60)
Glucose, Bld: 105 mg/dL — ABNORMAL HIGH (ref 70–99)
Potassium: 3.2 mmol/L — ABNORMAL LOW (ref 3.5–5.1)
Sodium: 136 mmol/L (ref 135–145)
Total Bilirubin: 1 mg/dL (ref 0.3–1.2)
Total Protein: 7.5 g/dL (ref 6.5–8.1)

## 2020-11-10 LAB — PROTIME-INR
INR: 0.9 (ref 0.8–1.2)
Prothrombin Time: 12 seconds (ref 11.4–15.2)

## 2020-11-10 LAB — TROPONIN I (HIGH SENSITIVITY)
Troponin I (High Sensitivity): 8 ng/L (ref <18)
Troponin I (High Sensitivity): 11 ng/L (ref <18)

## 2020-11-10 LAB — TSH: TSH: 3.695 u[IU]/mL (ref 0.350–4.500)

## 2020-11-10 LAB — T4, FREE: Free T4: 1.37 ng/dL — ABNORMAL HIGH (ref 0.61–1.12)

## 2020-11-10 MED ORDER — LINACLOTIDE 145 MCG PO CAPS
145.0000 ug | ORAL_CAPSULE | Freq: Every day | ORAL | Status: DC
Start: 2020-11-10 — End: 2020-11-11
  Administered 2020-11-11: 145 ug via ORAL
  Filled 2020-11-10 (×2): qty 1

## 2020-11-10 MED ORDER — PANTOPRAZOLE SODIUM 40 MG PO TBEC
40.0000 mg | DELAYED_RELEASE_TABLET | Freq: Every day | ORAL | Status: DC
  Administered 2020-11-11: 40 mg via ORAL
  Filled 2020-11-10 (×2): qty 1

## 2020-11-10 MED ORDER — B COMPLEX-C PO TABS
1.0000 | ORAL_TABLET | Freq: Every day | ORAL | Status: DC
  Administered 2020-11-10 – 2020-11-11 (×2): 1 via ORAL
  Filled 2020-11-10 (×2): qty 1

## 2020-11-10 MED ORDER — CALCIUM CARBONATE-VITAMIN D 500-200 MG-UNIT PO TABS
1.0000 | ORAL_TABLET | Freq: Every day | ORAL | Status: DC
  Administered 2020-11-11: 1 via ORAL
  Filled 2020-11-10 (×2): qty 1

## 2020-11-10 MED ORDER — ASPIRIN 81 MG PO CHEW
81.0000 mg | CHEWABLE_TABLET | Freq: Once | ORAL | Status: AC
  Administered 2020-11-10: 81 mg via ORAL
  Filled 2020-11-10: qty 1

## 2020-11-10 MED ORDER — ATORVASTATIN CALCIUM 20 MG PO TABS
40.0000 mg | ORAL_TABLET | Freq: Every day | ORAL | Status: DC
  Administered 2020-11-10: 40 mg via ORAL
  Filled 2020-11-10: qty 2

## 2020-11-10 MED ORDER — POTASSIUM CHLORIDE CRYS ER 20 MEQ PO TBCR
40.0000 meq | EXTENDED_RELEASE_TABLET | Freq: Once | ORAL | Status: AC
  Administered 2020-11-10: 40 meq via ORAL
  Filled 2020-11-10: qty 2

## 2020-11-10 MED ORDER — CALCIUM CARBONATE-VITAMIN D3 600-400 MG-UNIT PO TABS: 1.0000 | ORAL_TABLET | Freq: Every day | ORAL | Status: DC

## 2020-11-10 MED ORDER — HYDRALAZINE HCL 20 MG/ML IJ SOLN
5.0000 mg | INTRAMUSCULAR | Status: DC | PRN
  Administered 2020-11-10: 5 mg via INTRAVENOUS
  Filled 2020-11-10: qty 1

## 2020-11-10 MED ORDER — LEVOTHYROXINE SODIUM 88 MCG PO TABS
88.0000 ug | ORAL_TABLET | Freq: Every day | ORAL | Status: DC
  Administered 2020-11-11: 88 ug via ORAL
  Filled 2020-11-10 (×2): qty 1

## 2020-11-10 MED ORDER — ONDANSETRON HCL 4 MG/2ML IJ SOLN: 4.0000 mg | Freq: Three times a day (TID) | INTRAMUSCULAR | Status: DC | PRN

## 2020-11-10 MED ORDER — ASPIRIN EC 81 MG PO TBEC
81.0000 mg | DELAYED_RELEASE_TABLET | Freq: Every day | ORAL | Status: DC
  Administered 2020-11-11: 81 mg via ORAL
  Filled 2020-11-10: qty 1

## 2020-11-10 MED ORDER — DICLOFENAC SODIUM 1 % EX GEL
2.0000 g | Freq: Four times a day (QID) | CUTANEOUS | Status: DC
  Filled 2020-11-10: qty 100

## 2020-11-10 MED ORDER — SODIUM CHLORIDE 0.9 % IV SOLN: INTRAVENOUS | Status: DC

## 2020-11-10 MED ORDER — FLUTICASONE PROPIONATE 50 MCG/ACT NA SUSP
1.0000 | Freq: Every day | NASAL | Status: DC | PRN
  Filled 2020-11-10: qty 16

## 2020-11-10 MED ORDER — DICLOFENAC SODIUM 1 % TD GEL: 2.0000 g | Freq: Four times a day (QID) | TRANSDERMAL | Status: DC

## 2020-11-10 MED ORDER — B COMPLEX PO TABS: 1.0000 | ORAL_TABLET | Freq: Every day | ORAL | Status: DC

## 2020-11-10 MED ORDER — SUCRALFATE 1 G PO TABS
1.0000 g | ORAL_TABLET | Freq: Three times a day (TID) | ORAL | Status: DC
  Administered 2020-11-10 – 2020-11-11 (×4): 1 g via ORAL
  Filled 2020-11-10 (×4): qty 1

## 2020-11-10 MED ORDER — ACETAMINOPHEN 650 MG RE SUPP: 650.0000 mg | Freq: Four times a day (QID) | RECTAL | Status: DC | PRN

## 2020-11-10 MED ORDER — ALPRAZOLAM 0.25 MG PO TABS
0.2500 mg | ORAL_TABLET | Freq: Two times a day (BID) | ORAL | Status: DC | PRN
  Administered 2020-11-10: 0.25 mg via ORAL
  Filled 2020-11-10: qty 1

## 2020-11-10 MED ORDER — BUTALBITAL-APAP-CAFFEINE 50-325-40 MG PO TABS: 1.0000 | ORAL_TABLET | Freq: Four times a day (QID) | ORAL | Status: DC | PRN

## 2020-11-10 MED ORDER — CLOPIDOGREL BISULFATE 75 MG PO TABS
75.0000 mg | ORAL_TABLET | Freq: Every day | ORAL | Status: DC
  Administered 2020-11-11: 75 mg via ORAL
  Filled 2020-11-10: qty 1

## 2020-11-10 MED ORDER — AMLODIPINE BESYLATE 5 MG PO TABS
5.0000 mg | ORAL_TABLET | Freq: Every day | ORAL | Status: DC
  Administered 2020-11-10 – 2020-11-11 (×2): 5 mg via ORAL
  Filled 2020-11-10 (×2): qty 1

## 2020-11-10 MED ORDER — ACETAMINOPHEN 325 MG PO TABS: 650.0000 mg | ORAL_TABLET | Freq: Four times a day (QID) | ORAL | Status: DC | PRN

## 2020-11-10 MED ORDER — METOPROLOL TARTRATE 5 MG/5ML IV SOLN
2.5000 mg | Freq: Once | INTRAVENOUS | Status: DC
  Filled 2020-11-10: qty 5

## 2020-11-10 MED ORDER — CLOPIDOGREL BISULFATE 75 MG PO TABS
75.0000 mg | ORAL_TABLET | Freq: Once | ORAL | Status: AC
  Administered 2020-11-10: 75 mg via ORAL
  Filled 2020-11-10: qty 1

## 2020-11-10 MED ORDER — ENOXAPARIN SODIUM 40 MG/0.4ML ~~LOC~~ SOLN
40.0000 mg | SUBCUTANEOUS | Status: DC
  Administered 2020-11-10: 40 mg via SUBCUTANEOUS
  Filled 2020-11-10: qty 0.4

## 2020-11-10 NOTE — ED Notes (Signed)
Pt disconnected from the monitor so that she could walk to the bathroom, no distress noted cont to monitor

## 2020-11-10 NOTE — ED Triage Notes (Signed)
Pt presents to ER c/o new-onset confusion that started around 0400 this morning.  Pt also states she feels like her heart is racing and her BP is elevated.  Pt was DC'd at 2200 yesterday from the hospital.  Pt A&O x4.  Pt feels like she is having trouble getting words out at this time, and has been forgetting things she normally knows.  Pt does not have any motor neuro deficits.

## 2020-11-10 NOTE — ED Provider Notes (Signed)
[facility] Emergency Department Provider Note   ____________________________________________   Event Date/Time   First MD Initiated Contact with Patient 11/10/20 [phone]     (approximate)  I have reviewed the triage vital signs and the nursing notes.   HISTORY  Chief Complaint Altered Mental Status    HPI Sarah Harper is a 80 y.o. female who presents to the ED from home with a chief complaint of palpitations and confusion.  Patient was admitted 11/08/2020-11/10/2020 for CVA.  She presented with reading difficulty.  Work-up included CT head, CTA head and neck, MRI brain which revealed left frontal infarct.  Patient was started on dual antiplatelet therapy with aspirin and Plavix.  Patient states she was supposed to stay last night but convinced the doctor to discharge her because she thought she could get better rest at home.  At home she noted her blood pressure increased and experienced palpitations associated with chest discomfort.  Denies diaphoresis, shortness of breath, nausea/vomiting or dizziness.  On the drive to the ED she felt confused and felt like she could not get her words out and forgetting simple things.  Denies slurred speech, facial droop, extremity weakness/numbness/tingling.     Past Medical History:  Diagnosis Date  . DDD (degenerative disc disease)    chronic back pain  . ETD (eustachian tube dysfunction)   . GERD (gastroesophageal reflux disease)   . IBS (irritable bowel syndrome)   . Meniere's disease   . Migraine    still gets visual aura from time to time  . Osteoporosis   . Sleep apnea    CPAP  . Syncopal episodes    after work-up - possible seizures?  . Thyroid disease    hypothyroid    Patient Active Problem List   Diagnosis Date Noted  . Palpitations 11/10/2020  . Acute CVA (cerebrovascular accident) ([place]) 11/08/2020  . Right knee pain 09/26/2019  . Headache 09/26/2019  . Fall 06/02/2019  . Abrasion of right knee  06/02/2019  . Urinary retention 05/09/2019  . Syncope and collapse 12/12/2018  . Colon cancer screening 02/05/2018  . Degenerative joint disease (DJD) of lumbar spine 05/02/2017  . Internal hemorrhoids 05/02/2017  . Generalized osteoarthritis of hand 12/11/2016  . Stress reaction 12/11/2016  . Estrogen deficiency 05/11/2016  . Encounter for screening mammogram for breast cancer 05/11/2016  . OSA (obstructive sleep apnea) 03/06/2016  . Fatigue 01/25/2016  . Right ankle swelling 10/18/2015  . Left knee pain 10/18/2015  . Snoring 10/18/2015  . Routine general medical examination at a health care facility 02/28/2015  . Encounter for Medicare annual wellness exam 02/06/2013  . Hyperlipidemia 08/27/2012  . Irregular heart beat 08/05/2012  . Gynecological examination 12/20/2010  . BARRETTS ESOPHAGUS 10/01/2010  . Osteopenia 12/17/2009  . BACK PAIN, LUMBAR 07/02/2009  . IRRITABLE BOWEL SYNDROME 04/02/2009  . POSTMENOPAUSAL STATUS 12/10/2008  . Hypothyroidism 12/05/2007  . Meniere's disease 12/05/2007  . GERD 12/05/2007    Past Surgical History:  Procedure Laterality Date  . APPENDECTOMY    . CATARACT EXTRACTION W/PHACO Right 05/21/2019   Procedure: CATARACT EXTRACTION PHACO AND INTRAOCULAR LENS PLACEMENT ([place]) RIGHT panoptix lens  00:35.0  21.7%  7.61;  Surgeon: Leandrew Koyanagi, MD;  Location: [facility];  Service: Ophthalmology;  Laterality: Right;  sleep apnea requests early  . CATARACT EXTRACTION W/PHACO Left 06/11/2019   Procedure: CATARACT EXTRACTION PHACO AND INTRAOCULAR LENS PLACEMENT ([facility]) LEFT PANOPTIX TORIC LENS  00:50.0  20.3%  10.21;  Surgeon: Leandrew Koyanagi,  MD;  Location: [facility];  Service: Ophthalmology;  Laterality: Left;  sleep apnea requests early  . COLONOSCOPY     last 2012 - [facility]  . ESOPHAGOGASTRODUODENOSCOPY     Multiple, last 01/07/2017 with Savary dilation to 18 mm  . HEMORRHOID BANDING  2018   [facility]  . LUMBAR DISC SURGERY   1984   L5     Prior to Admission medications   Medication Sig Start Date End Date Taking? Authorizing Provider  ALPRAZolam (XANAX) 0.25 MG tablet Take 1 tablet (0.25 mg total) by mouth 2 (two) times daily as needed for anxiety (with caution of sedation). 03/09/20   Tower, [name], MD  aspirin EC 81 MG EC tablet Take 1 tablet (81 mg total) by mouth daily. Swallow whole. 11/10/20   Dwyane Dee, MD  atorvastatin (LIPITOR) 40 MG tablet Take 1 tablet (40 mg total) by mouth at bedtime. 11/09/20   Dwyane Dee, MD  b complex vitamins tablet Take 1 tablet by mouth daily.    [provider]  butalbital-acetaminophen-caffeine (FIORICET) 50-325-40 MG tablet Take 1 tablet by mouth every 6 (six) hours as needed. 03/09/20   Tower, [name], MD  Calcium Carb-Cholecalciferol (CALCIUM 600 + D PO) Take 1 tablet by mouth daily.    [provider]  clopidogrel (PLAVIX) 75 MG tablet Take 1 tablet (75 mg total) by mouth daily for 21 days. 11/10/20 12/01/20  Dwyane Dee, MD  dexlansoprazole (DEXILANT) 60 MG capsule Take 1 capsule (60 mg total) by mouth daily. 03/09/20   Tower, [name], MD  diclofenac sodium (VOLTAREN) 1 % GEL APPLY 2 GRAMS TOPICALLY FOUR TIMES A DAY TO AFFECTED AREAS Patient taking differently: Apply 2 g topically 4 (four) times daily. 05/24/18   Tower, [name], MD  estradiol (ESTRACE) 0.1 MG/GM vaginal cream Insert small amount (1 cm) in vagina every other day 03/09/20   Tower, [name], MD  levothyroxine (SYNTHROID) 88 MCG tablet Take 1 tablet (88 mcg total) by mouth daily before breakfast. 03/09/20   Tower, [name], MD  linaclotide [facility]) 145 MCG CAPS capsule Take 1 capsule (145 mcg total) by mouth daily. 03/09/20   Tower, [name], MD  mometasone (NASONEX) 50 MCG/ACT nasal spray Place 2 sprays into the nose daily as needed.    [provider]  sucralfate (CARAFATE) 1 g tablet TAKE 1 TABLET (1 G TOTAL) BY MOUTH 4 (FOUR) TIMES DAILY - WITH MEALS AND AT BEDTIME. 03/10/20    Gatha Mayer, MD    Allergies Nsaids and Tolmetin  Family History  Problem Relation Age of Onset  . Diabetes Paternal Aunt   . Breast cancer Paternal Aunt   . Hypertension Maternal Grandmother   . Hypertension Maternal Grandfather     Social History Social History   Tobacco Use  . Smoking status: Never Smoker  . Smokeless tobacco: Never Used  Vaping Use  . Vaping Use: Never used  Substance Use Topics  . Alcohol use: Not Currently    Comment: wine occasional  . Drug use: No    Review of Systems  Constitutional: No fever/chills Eyes: No visual changes. ENT: No sore throat. Cardiovascular: Positive for palpitations and chest discomfort. Respiratory: Denies shortness of breath. Gastrointestinal: No abdominal pain.  No nausea, no vomiting.  No diarrhea.  No constipation. Genitourinary: Negative for dysuria. Musculoskeletal: Negative for back pain. Skin: Negative for rash. Neurological: Positive for confusion.  Negative for headaches, focal weakness or numbness.   ____________________________________________   PHYSICAL  EXAM:  VITAL SIGNS: ED Triage Vitals  Enc Vitals Group     BP 11/10/20 0542 (!) 164/100     Pulse Rate 11/10/20 0542 (!) 104     Resp 11/10/20 0542 18     Temp 11/10/20 0542 98.5 F (36.9 C)     Temp Source 11/10/20 0542 Oral     SpO2 11/10/20 0542 99 %     Weight 11/10/20 0543 135 lb (61.2 kg)     Height 11/10/20 0543 5' 5.5" (1.664 m)     Head Circumference --      Peak Flow --      Pain Score 11/10/20 0542 0     Pain Loc --      Pain Edu? --      Excl. in [place]? --     Constitutional: Alert and oriented.  Elderly appearing and in mild acute distress. Eyes: Conjunctivae are normal. PERRL. EOMI. Head: Atraumatic. Nose: No congestion/rhinnorhea. Mouth/Throat: Mucous membranes are moist.   Neck: No stridor.  No carotid bruits. Cardiovascular: Tachycardic rate, regular rhythm. Grossly normal heart sounds.  Good peripheral  circulation. Respiratory: Normal respiratory effort.  No retractions. Lungs CTAB. Gastrointestinal: Soft and nontender. No distention. No abdominal bruits. No CVA tenderness. Musculoskeletal: No lower extremity tenderness nor edema.  No joint effusions. Neurologic: Alert and oriented x3.  CN II XII grossly intact.  Normal speech and language. No gross focal neurologic deficits are appreciated. MAEx4. Skin:  Skin is warm, dry and intact. No rash noted. Psychiatric: Mood and affect are normal. Speech and behavior are normal.  ____________________________________________   LABS (all labs ordered are listed, but only abnormal results are displayed)  Labs Reviewed  CBC WITH DIFFERENTIAL/PLATELET  PROTIME-INR  COMPREHENSIVE METABOLIC PANEL  TSH  T4, FREE  URINALYSIS, COMPLETE (UACMP) WITH MICROSCOPIC  TROPONIN I (HIGH SENSITIVITY)   ____________________________________________  EKG  ED ECG REPORT I, Mariamawit Depaoli J, the attending physician, personally viewed and interpreted this ECG.   Date: 11/10/2020  EKG Time: 0634  Rate: 82  Rhythm: normal EKG, normal sinus rhythm  Axis: LAD  Intervals:left anterior fascicular block  ST&T Change: Nonspecific  ____________________________________________  RADIOLOGY I, Laketia Vicknair J, personally viewed and evaluated these images (plain radiographs) as part of my medical decision making, as well as reviewing the written report by the radiologist.  ED MD interpretation: No ICH, no acute cardiopulmonary process  Official radiology report(s): CT Head Wo Contrast  Result Date: 11/10/2020 CLINICAL DATA:  Mental status change with unknown cause EXAM: CT HEAD WITHOUT CONTRAST TECHNIQUE: Contiguous axial images were obtained from the base of the skull through the vertex without intravenous contrast. COMPARISON:  CT an brain MRI from 2 days ago FINDINGS: Brain: No evidence of acute infarction, hemorrhage, hydrocephalus, extra-axial collection or mass  lesion/mass effect. Small vessel ischemic type changes by prior MRI. Age normal brain volume Vascular: No hyperdense vessel or unexpected calcification. Skull: Normal. Negative for fracture or focal lesion. Sinuses/Orbits: Bilateral cataract resection IMPRESSION: No acute or reversible finding.  Stable from head CT 2 days ago. Electronically Signed   By: Monte Fantasia M.D.   On: 11/10/2020 06:29   DG Chest Port 1 View  Result Date: 11/10/2020 CLINICAL DATA:  Pelvic patient's, altered mental status, discharged yesterday with diagnosis obstructing ext field radiograph 02/05/2012 EXAM: PORTABLE CHEST 1 VIEW COMPARISON:  Radiograph 02/05/2012 FINDINGS: Chronic hyperinflation with some coarsened interstitial changes which are likely chronic. No consolidative opacity or convincing features of edema. Vascularity remains well distributed. The  aorta is calcified. The remaining cardiomediastinal contours are unremarkable. No acute osseous or soft tissue abnormality. IMPRESSION: Chronic hyperinflation and interstitial changes. No acute cardiopulmonary process. Electronically Signed   By: Lovena Le M.D.   On: 11/10/2020 06:36   ECHOCARDIOGRAM COMPLETE BUBBLE STUDY  Result Date: 11/09/2020    ECHOCARDIOGRAM REPORT   Patient Name:   VIANNEY KOPECKY Date of Exam: 11/09/2020 Medical Rec #:  [id number]       Height:       65.5 in Accession #:    [id number]      Weight:       137.0 lb Date of Birth:  Jan 15, 1941      BSA:          1.694 m Patient Age:    54 years        BP:           164/87 mmHg Patient Gender: F               HR:           75 bpm. Exam Location:  [facility] Procedure: 2D Echo, Color Doppler, Cardiac Doppler and Saline Contrast Bubble            Study Indications:     I63.9 Stroke  History:         Patient has prior history of Echocardiogram examinations. Risk                  Factors:Sleep Apnea.  Sonographer:     Charmayne Sheer RDCS (AE) Referring Phys:  [id number] [place] Diagnosing Phys: Bartholome Bill MD  IMPRESSIONS  1. Left ventricular ejection fraction, by estimation, is 55 to 60%. The left ventricle has normal function. The left ventricle has no regional wall motion abnormalities. Left ventricular diastolic parameters were normal.  2. Right ventricular systolic function is normal. The right ventricular size is normal.  3. Left atrial size was mildly dilated.  4. The mitral valve is grossly normal. Mild mitral valve regurgitation.  5. The aortic valve is tricuspid. Aortic valve regurgitation is trivial. FINDINGS  Left Ventricle: Left ventricular ejection fraction, by estimation, is 55 to 60%. The left ventricle has normal function. The left ventricle has no regional wall motion abnormalities. The left ventricular internal cavity size was normal in size. There is  borderline left ventricular hypertrophy. Left ventricular diastolic parameters were normal. Right Ventricle: The right ventricular size is normal. No increase in right ventricular wall thickness. Right ventricular systolic function is normal. Left Atrium: Left atrial size was mildly dilated. Right Atrium: Right atrial size was normal in size. Pericardium: There is no evidence of pericardial effusion. Mitral Valve: The mitral valve is grossly normal. Mild mitral valve regurgitation. MV peak gradient, 5.2 mmHg. The mean mitral valve gradient is 3.0 mmHg. Tricuspid Valve: The tricuspid valve is not well visualized. Tricuspid valve regurgitation is trivial. Aortic Valve: The aortic valve is tricuspid. Aortic valve regurgitation is trivial. Aortic valve mean gradient measures 4.0 mmHg. Aortic valve peak gradient measures 7.3 mmHg. Aortic valve area, by VTI measures 2.55 cm. Pulmonic Valve: The pulmonic valve was not well visualized. Pulmonic valve regurgitation is mild. Aorta: The aortic arch was not well visualized. IAS/Shunts: The atrial septum is grossly normal. Agitated saline contrast was given intravenously to evaluate for intracardiac shunting.  LEFT  VENTRICLE PLAX 2D LVIDd:         3.90 cm  Diastology LVIDs:  2.70 cm  LV e' medial:    7.62 cm/s LV PW:         0.80 cm  LV E/e' medial:  14.7 LV IVS:        0.90 cm  LV e' lateral:   7.18 cm/s LVOT diam:     1.90 cm  LV E/e' lateral: 15.6 LV SV:         74 LV SV Index:   44 LVOT Area:     2.84 cm  RIGHT VENTRICLE RV Basal diam:  2.60 cm LEFT ATRIUM             Index LA diam:        4.10 cm 2.42 cm/m LA Vol (A2C):   36.0 ml 21.25 ml/m LA Vol (A4C):   30.2 ml 17.83 ml/m LA Biplane Vol: 34.7 ml 20.48 ml/m  AORTIC VALVE                   PULMONIC VALVE AV Area (Vmax):    2.67 cm    PV Vmax:       0.84 m/s AV Area (Vmean):   2.66 cm    PV Vmean:      60.100 cm/s AV Area (VTI):     2.55 cm    PV VTI:        0.184 m AV Vmax:           135.00 cm/s PV Peak grad:  2.8 mmHg AV Vmean:          92.500 cm/s PV Mean grad:  2.0 mmHg AV VTI:            0.290 m AV Peak Grad:      7.3 mmHg AV Mean Grad:      4.0 mmHg LVOT Vmax:         127.00 cm/s LVOT Vmean:        86.800 cm/s LVOT VTI:          0.261 m LVOT/AV VTI ratio: 0.90  AORTA Ao Root diam: 3.30 cm MITRAL VALVE MV Area (PHT): 7.22 cm     SHUNTS MV Area VTI:   2.82 cm     Systemic VTI:  0.26 m MV Peak grad:  5.2 mmHg     Systemic Diam: 1.90 cm MV Mean grad:  3.0 mmHg MV Vmax:       1.14 m/s MV Vmean:      84.8 cm/s MV Decel Time: 105 msec MV E velocity: 112.00 cm/s MV A velocity: 112.00 cm/s MV E/A ratio:  1.00 Bartholome Bill MD Electronically signed by Bartholome Bill MD Signature Date/Time: 11/09/2020/8:15:58 PM    Final     ____________________________________________   PROCEDURES  Procedure(s) performed (including Critical Care):  .1-3 Lead EKG Interpretation Performed by: Paulette Blanch, MD Authorized by: Paulette Blanch, MD     Interpretation: abnormal     ECG rate:  104   ECG rate assessment: tachycardic     Rhythm: sinus tachycardia     Ectopy: none     Conduction: normal   Comments:     Patient placed on cardiac monitor to evaluate for  arrhythmias    NIH Stroke Scale  Interval: Baseline Time: 6:51 AM Person Administering Scale: Ladislao Cohenour J  Administer stroke scale items in the order listed. Record performance in each category after each subscale exam. Do not go back and change scores. Follow directions provided for each exam technique. Scores should reflect what the  patient does, not what the clinician thinks the patient can do. The clinician should record answers while administering the exam and work quickly. Except where indicated, the patient should not be coached (i.e., repeated requests to patient to make a special effort).   1a  Level of consciousness: 0=alert; keenly responsive  1b. LOC questions:  0=Performs both tasks correctly  1c. LOC commands: 0=Performs both tasks correctly  2.  Best Gaze: 0=normal  3.  Visual: 0=No visual loss  4. Facial Palsy: 0=Normal symmetric movement  5a.  Motor left arm: 0=No drift, limb holds 90 (or 45) degrees for full 10 seconds  5b.  Motor right arm: 0=No drift, limb holds 90 (or 45) degrees for full 10 seconds  6a. motor left leg: 0=No drift, limb holds 90 (or 45) degrees for full 10 seconds  6b  Motor right leg:  0=No drift, limb holds 90 (or 45) degrees for full 10 seconds  7. Limb Ataxia: 0=Absent  8.  Sensory: 0=Normal; no sensory loss  9. Best Language:  0=No aphasia, normal  10. Dysarthria: 0=Normal  11. Extinction and Inattention: 0=No abnormality  12. Distal motor function: 0=Normal   Total:   0    ____________________________________________   INITIAL IMPRESSION / ASSESSMENT AND PLAN / ED COURSE  As part of my medical decision making, I reviewed the following data within the [place] History obtained from family, Nursing notes reviewed and incorporated, Labs reviewed, EKG interpreted, Old chart reviewed, Radiograph reviewed and Notes from prior ED visits     80 year old female just discharged from the hospital last evening for strokelike  symptoms returns with palpitations, chest discomfort and confusion. Differential diagnosis includes, but is not limited to, alcohol, illicit or prescription medications, or other toxic ingestion; intracranial pathology such as stroke or intracerebral hemorrhage; fever or infectious causes including sepsis; hypoxemia and/or hypercarbia; uremia; trauma; endocrine related disorders such as diabetes, hypoglycemia, and thyroid-related diseases; hypertensive encephalopathy; etc.  We will obtain cardiac work-up, CT head.  Sounds like stuttering strokelike symptoms in the setting of recent diagnosis and initiation of dual antiplatelet therapy.  Additionally patient with palpitations and chest discomfort.  On review of patient's chart I see that she had an echocardiogram yesterday EF 55 to 60%.  Clinical Course as of 11/10/20 0651  Wed Nov 10, 2020  0622 CT head negative for intracranial hemorrhage; will administer baby aspirin and Plavix.  Discussed with hospitalist services for admission. [JS]    Clinical Course User Index [JS] Paulette Blanch, MD     ____________________________________________   FINAL CLINICAL IMPRESSION(S) / ED DIAGNOSES  Final diagnoses:  Altered mental status, unspecified altered mental status type  Palpitations  Encephalopathy  Hypertension, unspecified type     ED Discharge Orders    None      *Please note:  Sarah Harper was evaluated in Emergency Department on 11/10/2020 for the symptoms described in the history of present illness. She was evaluated in the context of the global COVID-19 pandemic, which necessitated consideration that the patient might be at risk for infection with the SARS-CoV-2 virus that causes COVID-19. Institutional protocols and algorithms that pertain to the evaluation of patients at risk for COVID-19 are in a state of rapid change based on information released by regulatory bodies including the CDC and federal and state organizations. These  policies and algorithms were followed during the patient's care in the ED.  Some ED evaluations and interventions may be delayed as a result of  limited staffing during and the pandemic.*   Note:  This document was prepared using Dragon voice recognition software and may include unintentional dictation errors.   Paulette Blanch, MD 11/10/20 [id number]

## 2020-11-10 NOTE — H&P (Signed)
History and Physical    Sarah Harper WCB:[id number] DOB: Apr 02, 1941 DOA: 11/10/2020  Referring MD/NP/PA:   PCP: Abner Greenspan, MD   Patient coming from:  The patient is coming from home.  At baseline, pt is independent for most of ADL.        Chief Complaint: confusion, palpitation and chest discomfort  HPI: Sarah Harper is a 80 y.o. female with medical history significant hypertension, hyperlipidemia, GERD, hypothyroidism, anxiety, OSA, migraine headaches, IBS, newly diagnosed stroke, who presents with confusion, palpitation and chest discomfort.  Patient was recently hospitalized from 3/21-3/23 due to acute stroke. CTA head and neck was negative for LVO. MRI brain revealed left frontal infarct. Pt was discharged on dual antiplatelet therapy with aspirin and Plavix. Patient states she was supposed to stay last night but convinced the doctor to discharge her because she thought she could get better rest at home.   Per her husband, pt developed palpitation and heart racing with mild chest discomfort at home.  No cough, shortness of breath, fever or chills. She was mildly confused since afternoon. Her blood pressure was elevated with SBP 200s at home.  She seemed to have difficulty speaking out, but no unilateral numbness, weakness or tingling in extremities.  No facial droop.  She had nausea, no vomiting, diarrhea or abdominal pain. When I saw pt in ED, her mental status has improved.  She is oriented x3.  She answered all questions appropriately. She moves all extremities normally.  She states that her heart racing and chest discomfort have resolved.  Currently no chest pain or shortness breath.  No symptoms of UTI.   ED Course: pt was found to have WBC 7.3, negative Covid PCR, INR 0.9, troponin level 9 --> 11, potassium 3.2, renal function okay, temperature normal, blood pressure 188/86, 135/64, heart rate 104, 81, RR 20, oxygen sat 97% on room air.  Chest x-ray showed chronic  hyperinflation and interstitial changes. CT of head showed no acute or reversible finding and is stable from head CT 2 days ago. Pt is placed on progressive bed for observation.   Review of Systems:   General: no fevers, chills, no body weight gain, has fatigue HEENT: no blurry vision, hearing changes or sore throat Respiratory: no dyspnea, coughing, wheezing CV: has palpitations and chest discomfort GI: has nausea, no vomiting, abdominal pain, diarrhea, constipation GU: no dysuria, burning on urination, increased urinary frequency, hematuria  Ext: no leg edema Neuro: no unilateral weakness, numbness, or tingling, no vision change or hearing loss. Has confusion Skin: no rash, no skin tear. MSK: No muscle spasm, no deformity, no limitation of range of movement in spin Heme: No easy bruising.  Travel history: No recent long distant travel.  Allergy:  Allergies  Allergen Reactions  . Nsaids     GI discomfort and gastritis  . Tolmetin Other (See Comments)    GI discomfort and gastritis    Past Medical History:  Diagnosis Date  . DDD (degenerative disc disease)    chronic back pain  . ETD (eustachian tube dysfunction)   . GERD (gastroesophageal reflux disease)   . IBS (irritable bowel syndrome)   . Meniere's disease   . Migraine    still gets visual aura from time to time  . Osteoporosis   . Sleep apnea    CPAP  . Syncopal episodes    after work-up - possible seizures?  . Thyroid disease    hypothyroid    Past Surgical History:  Procedure Laterality Date  . APPENDECTOMY    . CATARACT EXTRACTION W/PHACO Right 05/21/2019   Procedure: CATARACT EXTRACTION PHACO AND INTRAOCULAR LENS PLACEMENT ([place]) RIGHT panoptix lens  00:35.0  21.7%  7.61;  Surgeon: Leandrew Koyanagi, MD;  Location: [place];  Service: Ophthalmology;  Laterality: Right;  sleep apnea requests early  . CATARACT EXTRACTION W/PHACO Left 06/11/2019   Procedure: CATARACT EXTRACTION PHACO AND  INTRAOCULAR LENS PLACEMENT ([facility]) LEFT PANOPTIX TORIC LENS  00:50.0  20.3%  10.21;  Surgeon: Leandrew Koyanagi, MD;  Location: [place];  Service: Ophthalmology;  Laterality: Left;  sleep apnea requests early  . COLONOSCOPY     last 2012 - [facility]  . ESOPHAGOGASTRODUODENOSCOPY     Multiple, last 01/07/2017 with Savary dilation to 18 mm  . HEMORRHOID BANDING  2018   [facility]  . LUMBAR DISC SURGERY  1984   L5     Social History:  reports that she has never smoked. She has never used smokeless tobacco. She reports previous alcohol use. She reports that she does not use drugs.  Family History:  Family History  Problem Relation Age of Onset  . Diabetes Paternal Aunt   . Breast cancer Paternal Aunt   . Hypertension Maternal Grandmother   . Hypertension Maternal Grandfather      Prior to Admission medications   Medication Sig Start Date End Date Taking? Authorizing Provider  estradiol (ESTRACE) 0.1 MG/GM vaginal cream Insert small amount (1 cm) in vagina every other day 03/09/20  Yes Tower, [name], MD  ALPRAZolam (XANAX) 0.25 MG tablet Take 1 tablet (0.25 mg total) by mouth 2 (two) times daily as needed for anxiety (with caution of sedation). 03/09/20   Tower, [name], MD  aspirin EC 81 MG EC tablet Take 1 tablet (81 mg total) by mouth daily. Swallow whole. 11/10/20   Dwyane Dee, MD  atorvastatin (LIPITOR) 40 MG tablet Take 1 tablet (40 mg total) by mouth at bedtime. 11/09/20   Dwyane Dee, MD  b complex vitamins tablet Take 1 tablet by mouth daily.    [provider]  butalbital-acetaminophen-caffeine (FIORICET) 50-325-40 MG tablet Take 1 tablet by mouth every 6 (six) hours as needed. 03/09/20   Tower, [name], MD  Calcium Carb-Cholecalciferol (CALCIUM 600 + D PO) Take 1 tablet by mouth daily.    [provider]  clopidogrel (PLAVIX) 75 MG tablet Take 1 tablet (75 mg total) by mouth daily for 21 days. 11/10/20 12/01/20  Dwyane Dee, MD  dexlansoprazole  (DEXILANT) 60 MG capsule Take 1 capsule (60 mg total) by mouth daily. 03/09/20   Tower, [name], MD  diclofenac sodium (VOLTAREN) 1 % GEL APPLY 2 GRAMS TOPICALLY FOUR TIMES A DAY TO AFFECTED AREAS Patient taking differently: Apply 2 g topically 4 (four) times daily. 05/24/18   Tower, [name], MD  levothyroxine (SYNTHROID) 88 MCG tablet Take 1 tablet (88 mcg total) by mouth daily before breakfast. 03/09/20   Tower, [name], MD  linaclotide [facility]) 145 MCG CAPS capsule Take 1 capsule (145 mcg total) by mouth daily. 03/09/20   Tower, [name], MD  mometasone (NASONEX) 50 MCG/ACT nasal spray Place 2 sprays into the nose daily as needed.    [provider]  sucralfate (CARAFATE) 1 g tablet TAKE 1 TABLET (1 G TOTAL) BY MOUTH 4 (FOUR) TIMES DAILY - WITH MEALS AND AT BEDTIME. 03/10/20   Gatha Mayer, MD    Physical Exam: Vitals:   11/10/20 0542 11/10/20 0543 11/10/20  3662  BP: (!) 164/100  (!) 148/87  Pulse: (!) 104  81  Resp: 18  20  Temp: 98.5 F (36.9 C)    TempSrc: Oral    SpO2: 99%  97%  Weight:  61.2 kg   Height:  5' 5.5" (1.664 m)    General: Not in acute distress HEENT:       Eyes: PERRL, EOMI, no scleral icterus.       ENT: No discharge from the ears and nose, no pharynx injection, no tonsillar enlargement.        Neck: No JVD, no bruit, no mass felt. Heme: No neck lymph node enlargement. Cardiac: S1/S2, RRR, No murmurs, No gallops or rubs. Respiratory: No rales, wheezing, rhonchi or rubs. GI: Soft, nondistended, nontender, no rebound pain, no organomegaly, BS present. GU: No hematuria Ext: No pitting leg edema bilaterally. 1+DP/PT pulse bilaterally. Musculoskeletal: No joint deformities, No joint redness or warmth, no limitation of ROM in spin. Skin: No rashes.  Neuro: Alert, oriented X3, cranial nerves II-XII grossly intact, moves all extremities normally. Muscle strength 5/5 in all extremities, sensation to light touch intact.  Psych: Patient is not psychotic, no  suicidal or hemocidal ideation.  Labs on Admission: I have personally reviewed following labs and imaging studies  CBC: Recent Labs  Lab 11/08/20 1427 11/09/20 0341 11/10/20 0600  WBC 5.4 7.5 7.3  NEUTROABS 2.9  --  4.9  HGB 12.2 14.1 13.8  HCT 37.0 42.5 41.1  MCV 88.7 88.5 88.6  PLT 207 244 947   Basic Metabolic Panel: Recent Labs  Lab 11/08/20 1427 11/09/20 0341 11/10/20 0600 11/10/20 0821  NA 132* 139 136  --   K 3.9 3.9 3.2*  --   CL 99 103 102  --   CO2 27 30 25   --   GLUCOSE 109* 101* 105*  --   BUN 17 16 15   --   CREATININE 0.65 0.42* 0.59  --   CALCIUM 8.8* 9.6 9.7  --   MG  --   --   --  1.8   GFR: Estimated Creatinine Clearance: 52.4 mL/min (by C-G formula based on SCr of 0.59 mg/dL). Liver Function Tests: Recent Labs  Lab 11/08/20 1427 11/10/20 0600  AST 22 27  ALT 17 20  ALKPHOS 73 82  BILITOT 0.6 1.0  PROT 6.3* 7.5  ALBUMIN 4.0 4.7   No results for input(s): LIPASE, AMYLASE in the last 168 hours. No results for input(s): AMMONIA in the last 168 hours. Coagulation Profile: Recent Labs  Lab 11/08/20 1427 11/10/20 0605  INR 1.0 0.9   Cardiac Enzymes: No results for input(s): CKTOTAL, CKMB, CKMBINDEX, TROPONINI in the last 168 hours. BNP (last 3 results) No results for input(s): PROBNP in the last 8760 hours. HbA1C: Recent Labs    11/09/20 0341  HGBA1C 5.7*   CBG: No results for input(s): GLUCAP in the last 168 hours. Lipid Profile: Recent Labs    11/09/20 0341  CHOL 219*  HDL 97  LDLCALC 109*  TRIG 67  CHOLHDL 2.3   Thyroid Function Tests: Recent Labs    11/10/20 0600  TSH 3.695  FREET4 1.37*   Anemia Panel: No results for input(s): VITAMINB12, FOLATE, FERRITIN, TIBC, IRON, RETICCTPCT in the last 72 hours. Urine analysis:    Component Value Date/Time   COLORURINE STRAW (A) 11/08/2020 2228   APPEARANCEUR CLEAR (A) 11/08/2020 2228   LABSPEC 1.016 11/08/2020 2228   PHURINE 7.0 11/08/2020 2228   GLUCOSEU NEGATIVE  11/08/2020 2228   HGBUR NEGATIVE 11/08/2020 2228   HGBUR negative 10/06/2008 1447   BILIRUBINUR NEGATIVE 11/08/2020 2228   BILIRUBINUR Negative 05/09/2019 1521   KETONESUR NEGATIVE 11/08/2020 2228   PROTEINUR NEGATIVE 11/08/2020 2228   UROBILINOGEN 0.2 05/09/2019 1521   UROBILINOGEN 0.2 10/06/2008 1447   NITRITE NEGATIVE 11/08/2020 2228   LEUKOCYTESUR NEGATIVE 11/08/2020 2228   Sepsis Labs: @LABRCNTIP (procalcitonin:4,lacticidven:4) ) Recent Results (from the past 240 hour(s))  SARS CORONAVIRUS 2 (TAT 6-24 HRS) Nasopharyngeal Nasopharyngeal Swab     Status: None   Collection Time: 11/09/20 11:54 AM   Specimen: Nasopharyngeal Swab  Result Value Ref Range Status   SARS Coronavirus 2 NEGATIVE NEGATIVE Final    Comment: (NOTE) SARS-CoV-2 target nucleic acids are NOT DETECTED.  The SARS-CoV-2 RNA is generally detectable in upper and lower respiratory specimens during the acute phase of infection. Negative results do not preclude SARS-CoV-2 infection, do not rule out co-infections with other pathogens, and should not be used as the sole basis for treatment or other patient management decisions. Negative results must be combined with clinical observations, patient history, and epidemiological information. The expected result is Negative.  Fact Sheet for Patients: SugarRoll.be  Fact Sheet for Healthcare Providers: https://www.woods-mathews.com/  This test is not yet approved or cleared by the [place] FDA and  has been authorized for detection and/or diagnosis of SARS-CoV-2 by FDA under an Emergency Use Authorization (EUA). This EUA will remain  in effect (meaning this test can be used) for the duration of the COVID-19 declaration under Se ction 564(b)(1) of the Act, 21 U.S.C. section 360bbb-3(b)(1), unless the authorization is terminated or revoked sooner.  Performed at [facility], [place] [address] [place],  [place] [address]      Radiological Exams on Admission: CT Angio Head W or Wo Contrast  Result Date: 11/08/2020 CLINICAL DATA:  Episodes of confusion EXAM: CT ANGIOGRAPHY HEAD AND NECK TECHNIQUE: Multidetector CT imaging of the head and neck was performed using the standard protocol during bolus administration of intravenous contrast. Multiplanar CT image reconstructions and MIPs were obtained to evaluate the vascular anatomy. Carotid stenosis measurements (when applicable) are obtained utilizing NASCET criteria, using the distal internal carotid diameter as the denominator. CONTRAST:  79mL OMNIPAQUE IOHEXOL 350 MG/ML SOLN COMPARISON:  April 2020 FINDINGS: CTA NECK Aortic arch: Great vessel origins are patent. There is aberrant origin of the right subclavian artery, an anatomic variant. Right carotid system: Patent. Calcified plaque at the ICA origin causes less than 50% stenosis. Left carotid system: Patent. Calcified plaque at the ICA origin without stenosis. Vertebral arteries: Patent and codominant. Skeleton: Degenerative changes of the cervical spine. Other neck: Negative. Upper chest: Included upper lungs are clear. Review of the MIP images confirms the above findings CTA HEAD Anterior circulation: Intracranial internal carotid arteries are patent with mild calcified plaque. Anterior cerebral arteries are patent. Left A1 ACA is dominant. Posterior circulation: Intracranial vertebral arteries and basilar artery are patent. Major cerebellar artery origins are patent. Posterior cerebral arteries are patent. Venous sinuses: Patent as allowed by contrast bolus timing. Review of the MIP images confirms the above findings IMPRESSION: No occlusion in the neck. Similar plaque at the right greater than left ICA origins with less than 50% stenosis. No proximal intracranial vessel occlusion. Electronically Signed   By: Macy Mis M.D.   On: 11/08/2020 16:40   CT Head Wo Contrast  Result Date: 11/10/2020 CLINICAL  DATA:  Mental status change with unknown cause EXAM: CT HEAD  WITHOUT CONTRAST TECHNIQUE: Contiguous axial images were obtained from the base of the skull through the vertex without intravenous contrast. COMPARISON:  CT an brain MRI from 2 days ago FINDINGS: Brain: No evidence of acute infarction, hemorrhage, hydrocephalus, extra-axial collection or mass lesion/mass effect. Small vessel ischemic type changes by prior MRI. Age normal brain volume Vascular: No hyperdense vessel or unexpected calcification. Skull: Normal. Negative for fracture or focal lesion. Sinuses/Orbits: Bilateral cataract resection IMPRESSION: No acute or reversible finding.  Stable from head CT 2 days ago. Electronically Signed   By: Monte Fantasia M.D.   On: 11/10/2020 06:29   CT Head Wo Contrast  Result Date: 11/08/2020 CLINICAL DATA:  Patient to ER for c/o confusion. Patient states she has had two episodes (first episode five days ago, second episode was yesterday) that she was reading a book and was unable to comprehend the words. EXAM: CT HEAD WITHOUT CONTRAST TECHNIQUE: Contiguous axial images were obtained from the base of the skull through the vertex without intravenous contrast. COMPARISON:  None. FINDINGS: Brain: No evidence of acute infarction, hemorrhage, hydrocephalus, extra-axial collection or mass lesion/mass effect. Generalized cerebral atrophy. Periventricular white matter low attenuation likely secondary to microangiopathy. Vascular: No hyperdense vessel or unexpected calcification. Skull: No osseous abnormality. Sinuses/Orbits: Visualized paranasal sinuses are clear. Visualized mastoid sinuses are clear. Visualized orbits demonstrate no focal abnormality. Other: None IMPRESSION: 1. No acute intracranial pathology. Electronically Signed   By: Kathreen Devoid   On: 11/08/2020 14:39   CT Angio Neck W and/or Wo Contrast  Result Date: 11/08/2020 CLINICAL DATA:  Episodes of confusion EXAM: CT ANGIOGRAPHY HEAD AND NECK  TECHNIQUE: Multidetector CT imaging of the head and neck was performed using the standard protocol during bolus administration of intravenous contrast. Multiplanar CT image reconstructions and MIPs were obtained to evaluate the vascular anatomy. Carotid stenosis measurements (when applicable) are obtained utilizing NASCET criteria, using the distal internal carotid diameter as the denominator. CONTRAST:  70mL OMNIPAQUE IOHEXOL 350 MG/ML SOLN COMPARISON:  April 2020 FINDINGS: CTA NECK Aortic arch: Great vessel origins are patent. There is aberrant origin of the right subclavian artery, an anatomic variant. Right carotid system: Patent. Calcified plaque at the ICA origin causes less than 50% stenosis. Left carotid system: Patent. Calcified plaque at the ICA origin without stenosis. Vertebral arteries: Patent and codominant. Skeleton: Degenerative changes of the cervical spine. Other neck: Negative. Upper chest: Included upper lungs are clear. Review of the MIP images confirms the above findings CTA HEAD Anterior circulation: Intracranial internal carotid arteries are patent with mild calcified plaque. Anterior cerebral arteries are patent. Left A1 ACA is dominant. Posterior circulation: Intracranial vertebral arteries and basilar artery are patent. Major cerebellar artery origins are patent. Posterior cerebral arteries are patent. Venous sinuses: Patent as allowed by contrast bolus timing. Review of the MIP images confirms the above findings IMPRESSION: No occlusion in the neck. Similar plaque at the right greater than left ICA origins with less than 50% stenosis. No proximal intracranial vessel occlusion. Electronically Signed   By: Macy Mis M.D.   On: 11/08/2020 16:40   MR BRAIN WO CONTRAST  Result Date: 11/08/2020 CLINICAL DATA:  Neuro deficit, acute, stroke suspected. EXAM: MRI HEAD WITHOUT CONTRAST TECHNIQUE: Multiplanar, multiecho pulse sequences of the brain and surrounding structures were obtained  without intravenous contrast. COMPARISON:  CT angiogram head/neck performed earlier today 11/08/2020. Contrast head CT performed earlier today 11/08/2020. Brain MRI 01/17/2019. FINDINGS: Brain: Mild cerebral atrophy. Punctate acute infarct within the high left frontal  lobe (series 5, image 40). New from the prior brain MRI of 01/17/2019, there is a small focus of extra-axial T2/FLAIR hyperintensity with corresponding restricted diffusion overlying the mid left frontal lobe (for instance as seen on series 15, image 49) (series 7, image 25). This is nonspecific but may reflect a trace extra-axial collection. Redemonstrated curvilinear SWI signal loss along the mid to posterior left frontal lobe compatible with chronic hemosiderin deposition from remote subarachnoid hemorrhage. Mild multifocal T2/FLAIR hyperintensity within the cerebral white matter is nonspecific, but compatible with chronic small vessel ischemic disease. Chronic small vessel ischemic changes are also present within the pons. No midline shift. Vascular: Expected proximal arterial flow voids.  No focal Skull and upper cervical spine: No focal marrow lesion. Sinuses/Orbits: Visualized orbits show no acute finding. Trace bilateral ethmoid sinus mucosal thickening. IMPRESSION: Punctate acute infarct within the high left frontal lobe. New from the prior brain MRI of 01/17/2019, there is a small focus of extra-axial T2/FLAIR hyperintensity with corresponding restricted diffusion overlying the mid left frontal lobe. This is nonspecific but may reflect a trace extra-axial collection. Consider post-contrast MR imaging of the brain for further characterization. Redemonstrated chronic hemosiderin deposition along the mid to posterior left frontal lobe compatible with sequela of remote subarachnoid hemorrhage. Mild chronic small vessel ischemic disease within the cerebral white matter and pons and mild cerebral atrophy, stable as compared to the brain MRI of  01/17/2019. Electronically Signed   By: Kellie Simmering DO   On: 11/08/2020 18:01   MR BRAIN W CONTRAST  Result Date: 11/08/2020 CLINICAL DATA:  Left frontal infarct. Possible small extra-axial collection. EXAM: MRI HEAD WITH CONTRAST TECHNIQUE: Multiplanar, multiecho pulse sequences of the brain and surrounding structures were obtained with intravenous contrast. CONTRAST:  75mL GADAVIST GADOBUTROL 1 MMOL/ML IV SOLN COMPARISON:  Brain MRI without contrast 11/08/2020 FINDINGS: There is no abnormal contrast enhancement. No extra-axial collection. Otherwise, the examination is unchanged compared to the earlier brain MRI without contrast. IMPRESSION: No extra-axial collection or abnormal contrast enhancement. Electronically Signed   By: Ulyses Jarred M.D.   On: 11/08/2020 20:52   DG Chest Port 1 View  Result Date: 11/10/2020 CLINICAL DATA:  Pelvic patient's, altered mental status, discharged yesterday with diagnosis obstructing ext field radiograph 02/05/2012 EXAM: PORTABLE CHEST 1 VIEW COMPARISON:  Radiograph 02/05/2012 FINDINGS: Chronic hyperinflation with some coarsened interstitial changes which are likely chronic. No consolidative opacity or convincing features of edema. Vascularity remains well distributed. The aorta is calcified. The remaining cardiomediastinal contours are unremarkable. No acute osseous or soft tissue abnormality. IMPRESSION: Chronic hyperinflation and interstitial changes. No acute cardiopulmonary process. Electronically Signed   By: Lovena Le M.D.   On: 11/10/2020 06:36   ECHOCARDIOGRAM COMPLETE BUBBLE STUDY  Result Date: 11/09/2020    ECHOCARDIOGRAM REPORT   Patient Name:   GERAL TUCH Date of Exam: 11/09/2020 Medical Rec #:  [id number]       Height:       65.5 in Accession #:    [id number]      Weight:       137.0 lb Date of Birth:  06/27/1941      BSA:          1.694 m Patient Age:    64 years        BP:           164/87 mmHg Patient Gender: F               HR:  75  bpm. Exam Location:  [facility] Procedure: 2D Echo, Color Doppler, Cardiac Doppler and Saline Contrast Bubble            Study Indications:     I63.9 Stroke  History:         Patient has prior history of Echocardiogram examinations. Risk                  Factors:Sleep Apnea.  Sonographer:     Charmayne Sheer RDCS (AE) Referring Phys:  [id number] [place] Diagnosing Phys: Bartholome Bill MD IMPRESSIONS  1. Left ventricular ejection fraction, by estimation, is 55 to 60%. The left ventricle has normal function. The left ventricle has no regional wall motion abnormalities. Left ventricular diastolic parameters were normal.  2. Right ventricular systolic function is normal. The right ventricular size is normal.  3. Left atrial size was mildly dilated.  4. The mitral valve is grossly normal. Mild mitral valve regurgitation.  5. The aortic valve is tricuspid. Aortic valve regurgitation is trivial. FINDINGS  Left Ventricle: Left ventricular ejection fraction, by estimation, is 55 to 60%. The left ventricle has normal function. The left ventricle has no regional wall motion abnormalities. The left ventricular internal cavity size was normal in size. There is  borderline left ventricular hypertrophy. Left ventricular diastolic parameters were normal. Right Ventricle: The right ventricular size is normal. No increase in right ventricular wall thickness. Right ventricular systolic function is normal. Left Atrium: Left atrial size was mildly dilated. Right Atrium: Right atrial size was normal in size. Pericardium: There is no evidence of pericardial effusion. Mitral Valve: The mitral valve is grossly normal. Mild mitral valve regurgitation. MV peak gradient, 5.2 mmHg. The mean mitral valve gradient is 3.0 mmHg. Tricuspid Valve: The tricuspid valve is not well visualized. Tricuspid valve regurgitation is trivial. Aortic Valve: The aortic valve is tricuspid. Aortic valve regurgitation is trivial. Aortic valve mean gradient measures 4.0 mmHg.  Aortic valve peak gradient measures 7.3 mmHg. Aortic valve area, by VTI measures 2.55 cm. Pulmonic Valve: The pulmonic valve was not well visualized. Pulmonic valve regurgitation is mild. Aorta: The aortic arch was not well visualized. IAS/Shunts: The atrial septum is grossly normal. Agitated saline contrast was given intravenously to evaluate for intracardiac shunting.  LEFT VENTRICLE PLAX 2D LVIDd:         3.90 cm  Diastology LVIDs:         2.70 cm  LV e' medial:    7.62 cm/s LV PW:         0.80 cm  LV E/e' medial:  14.7 LV IVS:        0.90 cm  LV e' lateral:   7.18 cm/s LVOT diam:     1.90 cm  LV E/e' lateral: 15.6 LV SV:         74 LV SV Index:   44 LVOT Area:     2.84 cm  RIGHT VENTRICLE RV Basal diam:  2.60 cm LEFT ATRIUM             Index LA diam:        4.10 cm 2.42 cm/m LA Vol (A2C):   36.0 ml 21.25 ml/m LA Vol (A4C):   30.2 ml 17.83 ml/m LA Biplane Vol: 34.7 ml 20.48 ml/m  AORTIC VALVE                   PULMONIC VALVE AV Area (Vmax):    2.67 cm    PV Vmax:  0.84 m/s AV Area (Vmean):   2.66 cm    PV Vmean:      60.100 cm/s AV Area (VTI):     2.55 cm    PV VTI:        0.184 m AV Vmax:           135.00 cm/s PV Peak grad:  2.8 mmHg AV Vmean:          92.500 cm/s PV Mean grad:  2.0 mmHg AV VTI:            0.290 m AV Peak Grad:      7.3 mmHg AV Mean Grad:      4.0 mmHg LVOT Vmax:         127.00 cm/s LVOT Vmean:        86.800 cm/s LVOT VTI:          0.261 m LVOT/AV VTI ratio: 0.90  AORTA Ao Root diam: 3.30 cm MITRAL VALVE MV Area (PHT): 7.22 cm     SHUNTS MV Area VTI:   2.82 cm     Systemic VTI:  0.26 m MV Peak grad:  5.2 mmHg     Systemic Diam: 1.90 cm MV Mean grad:  3.0 mmHg MV Vmax:       1.14 m/s MV Vmean:      84.8 cm/s MV Decel Time: 105 msec MV E velocity: 112.00 cm/s MV A velocity: 112.00 cm/s MV E/A ratio:  1.00 Bartholome Bill MD Electronically signed by Bartholome Bill MD Signature Date/Time: 11/09/2020/8:15:58 PM    Final      EKG: I have personally reviewed.  Sinus rhythm, QTC 452, low  voltage, poor R wave progression, LAD   Assessment/Plan Principal Problem:   Chest discomfort Active Problems:   Hypothyroidism   GERD   Acute CVA (cerebrovascular accident) ([place])   Palpitations   Migraine   Hypokalemia   Acute metabolic encephalopathy   HLD (hyperlipidemia)   Anxiety   Hypertensive urgency   Chest discomfort and palpitations: Symptoms as resolved.  Etiology is not clear, but possibly related to hypertensive urgency.  Patient had elevated blood pressure to SBP 200s at home. 188/86 in ED --> courrently 135/64. Trop 8 -->11.0  -will place in progressive bed for observation -will get troponin one more time -Bp controle. -on ASA and plavix  Hypertensive urgency:  -IV hydralazine as needed -start amlodipine 5 mg daily  Acute metabolic encephalopathy: Etiology is not clear.  CT head is negative for acute issues.  May be related to hypertensive urgency -Frequent neuro check -Blood pressure control as above  Hypothyroidism -Synthroid  GERD -Protonix  Recent acute CVA (cerebrovascular accident) ([place]) -Lipitor, aspirin, Plavix  Migraine -As needed Fioricet  Hypokalemia: Potassium 3.2 -Repleted potassium -Check magnesium level  HLD (hyperlipidemia) -Lipitor  Anxiety -As needed Xanax           DVT ppx: SQ Lovenox Code Status: Full code Family Communication:    Yes, patient's husband at bed side Disposition Plan:  Anticipate discharge back to previous environment Consults called:  none Admission status and Level of care: Progressive Cardiac:   for obs      Status is: Observation  The patient remains OBS appropriate and will d/c before 2 midnights.  Dispo: The patient is from: Home              Anticipated d/c is to: Home              Patient currently is not  medically stable to d/c.   Difficult to place patient No          Date of Service 11/10/2020    [facility]   If 7PM-7AM, please contact  night-coverage [email or web address] 11/10/2020, 11:10 AM

## 2020-11-10 NOTE — ED Notes (Signed)
Report messaged to christen rn floor nurse

## 2020-11-10 NOTE — Discharge Summary (Signed)
Physician Discharge Summary   Sarah Harper RJJ:[id number] DOB: [birth year] DOA: 11/08/2020  PCP: Abner Greenspan, MD  Admit date: 11/08/2020 Discharge date: 11/09/2020   Admitted From: home Disposition:  home Discharging physician: Dwyane Dee, MD  Recommendations for Outpatient Follow-up:  1. Follow up with neurology 2. Follow up BP and may need medication if remains elevated 3. Follow up aspirin tolerance given history of Barrett's   Patient discharged to home in Discharge Condition: stable Risk of unplanned readmission score: Unplanned Admission- Pilot do not use: 8.23  CODE STATUS: Full Diet recommendation:  Diet Orders (From admission, onward)    Start     Ordered   11/09/20 0000  Diet general        11/09/20 [place] [facility] Course: Sarah Harper is a 80 y.o. female with medical history significant for Mnire's disease, IBS, osteoporosis, OSA on CPAP, hypothyroidism, GERD who presented from home to [facility] ED due to transient difficulty comprehending words when she was reading at home.  Deficits had been present for approx 3 days PTA.  She underwent MRI brain which showed a punctate acute infarct involving the high left frontal lobe.  She was evaluated by neurology and underwent remaining stroke workup.  CTA head/neck showed no LVO or significant carotid stenosis.  Echo was negative for PFO or WMA. EF 55%. A1c 5.7%, LDL 109. TSH normal.   She has a known history of Barrett's esophagus and is followed by GI. She's had EGD and MBSS in the past. She was started on asa/plavix for 21 days followed by monotherapy aspirin per neurology. She will need to be followed up for tolerance of aspirin and if needed may need alternative therapy long term.   She was allowed permissive HTN on admission and her BP slowly improved on its own during observation. Prior to discharge it again become elevated some and she was treated with hydralazine x 1 with mild improvement. She is  not on BP medication at baseline and she was recommended to have close follow up with her PCP to re-evaluate her BP outpatient.  Of note, she was extremely anxious all day of discharge for leaving and this was also agitating her; to the point that she was considering leaving AMA b/c she did not want to continue remaining in the ER waiting on a room.  She was stable, with no further neuro deficits prior to discharge and her aphasia had resolved prior to discharge home as well.    The patient's chronic medical conditions were treated accordingly per the patient's home medication regimen except as noted.  On day of discharge, patient was felt deemed stable for discharge. Patient/family member advised to call PCP or come back to ER if needed.  Principal Diagnosis: Acute CVA (cerebrovascular accident) [facility])  Discharge Diagnoses: Active Hospital Problems   Diagnosis Date Noted  . Acute CVA (cerebrovascular accident) ([place]) 11/08/2020    Resolved Hospital Problems  No resolved problems to display.    Discharge Instructions    Diet general   Complete by: As directed    Increase activity slowly   Complete by: As directed      Allergies as of 11/09/2020      Reactions   Nsaids    GI discomfort and gastritis   Tolmetin Other (See Comments)   GI discomfort and gastritis      Medication List    TAKE these medications   ALPRAZolam 0.25 MG tablet  Commonly known as: XANAX Take 1 tablet (0.25 mg total) by mouth 2 (two) times daily as needed for anxiety (with caution of sedation).   aspirin 81 MG EC tablet Take 1 tablet (81 mg total) by mouth daily. Swallow whole.   atorvastatin 40 MG tablet Commonly known as: LIPITOR Take 1 tablet (40 mg total) by mouth at bedtime.   b complex vitamins tablet Take 1 tablet by mouth daily.   butalbital-acetaminophen-caffeine 50-325-40 MG tablet Commonly known as: Fioricet Take 1 tablet by mouth every 6 (six) hours as needed.   CALCIUM 600 + D  PO Take 1 tablet by mouth daily.   clopidogrel 75 MG tablet Commonly known as: PLAVIX Take 1 tablet (75 mg total) by mouth daily for 21 days.   Dexilant 60 MG capsule Generic drug: dexlansoprazole Take 1 capsule (60 mg total) by mouth daily.   diclofenac sodium 1 % Gel Commonly known as: VOLTAREN APPLY 2 GRAMS TOPICALLY FOUR TIMES A DAY TO AFFECTED AREAS What changed: See the new instructions.   estradiol 0.1 MG/GM vaginal cream Commonly known as: ESTRACE Insert small amount (1 cm) in vagina every other day   levothyroxine 88 MCG tablet Commonly known as: SYNTHROID Take 1 tablet (88 mcg total) by mouth daily before breakfast.   linaclotide 145 MCG Caps capsule Commonly known as: Linzess Take 1 capsule (145 mcg total) by mouth daily.   mometasone 50 MCG/ACT nasal spray Commonly known as: NASONEX Place 2 sprays into the nose daily as needed.   sucralfate 1 g tablet Commonly known as: CARAFATE TAKE 1 TABLET (1 G TOTAL) BY MOUTH 4 (FOUR) TIMES DAILY - WITH MEALS AND AT BEDTIME.       Follow-up Information    Tower, [name], MD. Schedule an appointment as soon as possible for a visit in 1 week(s).   Specialties: Family Medicine, Radiology Why: Follow up blood pressure elevation Contact information: [place] [address] [phone]        Kate Sable, MD .   Specialties: Cardiology, Radiology Contact information: [address] [place] [address] [phone]              Allergies  Allergen Reactions  . Nsaids     GI discomfort and gastritis  . Tolmetin Other (See Comments)    GI discomfort and gastritis    Consultations: Neurology  Discharge Exam: BP (!) 187/83 (BP Location: Right Arm)   Pulse 71   Temp 98 F (36.7 C) (Oral)   Resp 16   Ht 5' 5.5" (1.664 m)   Wt 62.1 kg   SpO2 100%   BMI 22.45 kg/m  General appearance: alert, cooperative and no distress Head: Normocephalic, without obvious  abnormality, atraumatic Eyes: EOMI, PERRLA Lungs: clear to auscultation bilaterally Heart: regular rate and rhythm and S1, S2 normal Abdomen: normal findings: bowel sounds normal and soft, non-tender Extremities: no edema Skin: mobility and turgor normal Neurologic: strength 5/5 and equal throughout; sensation equal and normal. No dysmetria, no dysdiadochokinesia, no receptive aphasia noted. Gait deferred.    The results of significant diagnostics from this hospitalization (including imaging, microbiology, ancillary and laboratory) are listed below for reference.   Microbiology: Recent Results (from the past 240 hour(s))  SARS CORONAVIRUS 2 (TAT 6-24 HRS) Nasopharyngeal Nasopharyngeal Swab     Status: None   Collection Time: 11/09/20 11:54 AM   Specimen: Nasopharyngeal Swab  Result Value Ref Range Status   SARS Coronavirus 2 NEGATIVE NEGATIVE Final  Comment: (NOTE) SARS-CoV-2 target nucleic acids are NOT DETECTED.  The SARS-CoV-2 RNA is generally detectable in upper and lower respiratory specimens during the acute phase of infection. Negative results do not preclude SARS-CoV-2 infection, do not rule out co-infections with other pathogens, and should not be used as the sole basis for treatment or other patient management decisions. Negative results must be combined with clinical observations, patient history, and epidemiological information. The expected result is Negative.  Fact Sheet for Patients: SugarRoll.be  Fact Sheet for Healthcare Providers: https://www.woods-mathews.com/  This test is not yet approved or cleared by the [place] FDA and  has been authorized for detection and/or diagnosis of SARS-CoV-2 by FDA under an Emergency Use Authorization (EUA). This EUA will remain  in effect (meaning this test can be used) for the duration of the COVID-19 declaration under Se ction 564(b)(1) of the Act, 21 U.S.C. section  360bbb-3(b)(1), unless the authorization is terminated or revoked sooner.  Performed at [facility], [place] [address] [place], [place] [address]      Labs: BNP (last 3 results) No results for input(s): BNP in the last 8760 hours. Basic Metabolic Panel: Recent Labs  Lab 11/08/20 1427 11/09/20 0341 11/10/20 0600  NA 132* 139 136  K 3.9 3.9 3.2*  CL 99 103 102  CO2 27 30 25   GLUCOSE 109* 101* 105*  BUN 17 16 15   CREATININE 0.65 0.42* 0.59  CALCIUM 8.8* 9.6 9.7   Liver Function Tests: Recent Labs  Lab 11/08/20 1427 11/10/20 0600  AST 22 27  ALT 17 20  ALKPHOS 73 82  BILITOT 0.6 1.0  PROT 6.3* 7.5  ALBUMIN 4.0 4.7   No results for input(s): LIPASE, AMYLASE in the last 168 hours. No results for input(s): AMMONIA in the last 168 hours. CBC: Recent Labs  Lab 11/08/20 1427 11/09/20 0341 11/10/20 0600  WBC 5.4 7.5 7.3  NEUTROABS 2.9  --  4.9  HGB 12.2 14.1 13.8  HCT 37.0 42.5 41.1  MCV 88.7 88.5 88.6  PLT 207 244 232   Cardiac Enzymes: No results for input(s): CKTOTAL, CKMB, CKMBINDEX, TROPONINI in the last 168 hours. BNP: Invalid input(s): POCBNP CBG: No results for input(s): GLUCAP in the last 168 hours. D-Dimer No results for input(s): DDIMER in the last 72 hours. Hgb A1c Recent Labs    11/09/20 0341  HGBA1C 5.7*   Lipid Profile Recent Labs    11/09/20 0341  CHOL 219*  HDL 97  LDLCALC 109*  TRIG 67  CHOLHDL 2.3   Thyroid function studies Recent Labs    11/10/20 0600  TSH 3.695   Anemia work up No results for input(s): VITAMINB12, FOLATE, FERRITIN, TIBC, IRON, RETICCTPCT in the last 72 hours. Urinalysis    Component Value Date/Time   COLORURINE STRAW (A) 11/08/2020 2228   APPEARANCEUR CLEAR (A) 11/08/2020 2228   LABSPEC 1.016 11/08/2020 2228   PHURINE 7.0 11/08/2020 2228   GLUCOSEU NEGATIVE 11/08/2020 2228   HGBUR NEGATIVE 11/08/2020 2228   HGBUR negative 10/06/2008 1447   BILIRUBINUR NEGATIVE 11/08/2020 2228   BILIRUBINUR  Negative 05/09/2019 1521   KETONESUR NEGATIVE 11/08/2020 2228   PROTEINUR NEGATIVE 11/08/2020 2228   UROBILINOGEN 0.2 05/09/2019 1521   UROBILINOGEN 0.2 10/06/2008 1447   NITRITE NEGATIVE 11/08/2020 2228   LEUKOCYTESUR NEGATIVE 11/08/2020 2228   Sepsis Labs Invalid input(s): PROCALCITONIN,  WBC,  LACTICIDVEN Microbiology Recent Results (from the past 240 hour(s))  SARS CORONAVIRUS 2 (TAT 6-24 HRS) Nasopharyngeal Nasopharyngeal Swab  Status: None   Collection Time: 11/09/20 11:54 AM   Specimen: Nasopharyngeal Swab  Result Value Ref Range Status   SARS Coronavirus 2 NEGATIVE NEGATIVE Final    Comment: (NOTE) SARS-CoV-2 target nucleic acids are NOT DETECTED.  The SARS-CoV-2 RNA is generally detectable in upper and lower respiratory specimens during the acute phase of infection. Negative results do not preclude SARS-CoV-2 infection, do not rule out co-infections with other pathogens, and should not be used as the sole basis for treatment or other patient management decisions. Negative results must be combined with clinical observations, patient history, and epidemiological information. The expected result is Negative.  Fact Sheet for Patients: SugarRoll.be  Fact Sheet for Healthcare Providers: https://www.woods-mathews.com/  This test is not yet approved or cleared by the [place] FDA and  has been authorized for detection and/or diagnosis of SARS-CoV-2 by FDA under an Emergency Use Authorization (EUA). This EUA will remain  in effect (meaning this test can be used) for the duration of the COVID-19 declaration under Se ction 564(b)(1) of the Act, 21 U.S.C. section 360bbb-3(b)(1), unless the authorization is terminated or revoked sooner.  Performed at [facility], [place] [address], [place], [place] [address]     Procedures/Studies: CT Angio Head W or Wo Contrast  Result Date: 11/08/2020 CLINICAL DATA:  Episodes of  confusion EXAM: CT ANGIOGRAPHY HEAD AND NECK TECHNIQUE: Multidetector CT imaging of the head and neck was performed using the standard protocol during bolus administration of intravenous contrast. Multiplanar CT image reconstructions and MIPs were obtained to evaluate the vascular anatomy. Carotid stenosis measurements (when applicable) are obtained utilizing NASCET criteria, using the distal internal carotid diameter as the denominator. CONTRAST:  18mL OMNIPAQUE IOHEXOL 350 MG/ML SOLN COMPARISON:  April 2020 FINDINGS: CTA NECK Aortic arch: Great vessel origins are patent. There is aberrant origin of the right subclavian artery, an anatomic variant. Right carotid system: Patent. Calcified plaque at the ICA origin causes less than 50% stenosis. Left carotid system: Patent. Calcified plaque at the ICA origin without stenosis. Vertebral arteries: Patent and codominant. Skeleton: Degenerative changes of the cervical spine. Other neck: Negative. Upper chest: Included upper lungs are clear. Review of the MIP images confirms the above findings CTA HEAD Anterior circulation: Intracranial internal carotid arteries are patent with mild calcified plaque. Anterior cerebral arteries are patent. Left A1 ACA is dominant. Posterior circulation: Intracranial vertebral arteries and basilar artery are patent. Major cerebellar artery origins are patent. Posterior cerebral arteries are patent. Venous sinuses: Patent as allowed by contrast bolus timing. Review of the MIP images confirms the above findings IMPRESSION: No occlusion in the neck. Similar plaque at the right greater than left ICA origins with less than 50% stenosis. No proximal intracranial vessel occlusion. Electronically Signed   By: Macy Mis M.D.   On: 11/08/2020 16:40   CT Head Wo Contrast  Result Date: 11/10/2020 CLINICAL DATA:  Mental status change with unknown cause EXAM: CT HEAD WITHOUT CONTRAST TECHNIQUE: Contiguous axial images were obtained from the base  of the skull through the vertex without intravenous contrast. COMPARISON:  CT an brain MRI from 2 days ago FINDINGS: Brain: No evidence of acute infarction, hemorrhage, hydrocephalus, extra-axial collection or mass lesion/mass effect. Small vessel ischemic type changes by prior MRI. Age normal brain volume Vascular: No hyperdense vessel or unexpected calcification. Skull: Normal. Negative for fracture or focal lesion. Sinuses/Orbits: Bilateral cataract resection IMPRESSION: No acute or reversible finding.  Stable from head CT 2 days ago. Electronically Signed  By: Monte Fantasia M.D.   On: 11/10/2020 06:29   CT Head Wo Contrast  Result Date: 11/08/2020 CLINICAL DATA:  Patient to ER for c/o confusion. Patient states she has had two episodes (first episode five days ago, second episode was yesterday) that she was reading a book and was unable to comprehend the words. EXAM: CT HEAD WITHOUT CONTRAST TECHNIQUE: Contiguous axial images were obtained from the base of the skull through the vertex without intravenous contrast. COMPARISON:  None. FINDINGS: Brain: No evidence of acute infarction, hemorrhage, hydrocephalus, extra-axial collection or mass lesion/mass effect. Generalized cerebral atrophy. Periventricular white matter low attenuation likely secondary to microangiopathy. Vascular: No hyperdense vessel or unexpected calcification. Skull: No osseous abnormality. Sinuses/Orbits: Visualized paranasal sinuses are clear. Visualized mastoid sinuses are clear. Visualized orbits demonstrate no focal abnormality. Other: None IMPRESSION: 1. No acute intracranial pathology. Electronically Signed   By: Kathreen Devoid   On: 11/08/2020 14:39   CT Angio Neck W and/or Wo Contrast  Result Date: 11/08/2020 CLINICAL DATA:  Episodes of confusion EXAM: CT ANGIOGRAPHY HEAD AND NECK TECHNIQUE: Multidetector CT imaging of the head and neck was performed using the standard protocol during bolus administration of intravenous  contrast. Multiplanar CT image reconstructions and MIPs were obtained to evaluate the vascular anatomy. Carotid stenosis measurements (when applicable) are obtained utilizing NASCET criteria, using the distal internal carotid diameter as the denominator. CONTRAST:  61mL OMNIPAQUE IOHEXOL 350 MG/ML SOLN COMPARISON:  April 2020 FINDINGS: CTA NECK Aortic arch: Great vessel origins are patent. There is aberrant origin of the right subclavian artery, an anatomic variant. Right carotid system: Patent. Calcified plaque at the ICA origin causes less than 50% stenosis. Left carotid system: Patent. Calcified plaque at the ICA origin without stenosis. Vertebral arteries: Patent and codominant. Skeleton: Degenerative changes of the cervical spine. Other neck: Negative. Upper chest: Included upper lungs are clear. Review of the MIP images confirms the above findings CTA HEAD Anterior circulation: Intracranial internal carotid arteries are patent with mild calcified plaque. Anterior cerebral arteries are patent. Left A1 ACA is dominant. Posterior circulation: Intracranial vertebral arteries and basilar artery are patent. Major cerebellar artery origins are patent. Posterior cerebral arteries are patent. Venous sinuses: Patent as allowed by contrast bolus timing. Review of the MIP images confirms the above findings IMPRESSION: No occlusion in the neck. Similar plaque at the right greater than left ICA origins with less than 50% stenosis. No proximal intracranial vessel occlusion. Electronically Signed   By: Macy Mis M.D.   On: 11/08/2020 16:40   MR BRAIN WO CONTRAST  Result Date: 11/08/2020 CLINICAL DATA:  Neuro deficit, acute, stroke suspected. EXAM: MRI HEAD WITHOUT CONTRAST TECHNIQUE: Multiplanar, multiecho pulse sequences of the brain and surrounding structures were obtained without intravenous contrast. COMPARISON:  CT angiogram head/neck performed earlier today 11/08/2020. Contrast head CT performed earlier today  11/08/2020. Brain MRI 01/17/2019. FINDINGS: Brain: Mild cerebral atrophy. Punctate acute infarct within the high left frontal lobe (series 5, image 40). New from the prior brain MRI of 01/17/2019, there is a small focus of extra-axial T2/FLAIR hyperintensity with corresponding restricted diffusion overlying the mid left frontal lobe (for instance as seen on series 15, image 49) (series 7, image 25). This is nonspecific but may reflect a trace extra-axial collection. Redemonstrated curvilinear SWI signal loss along the mid to posterior left frontal lobe compatible with chronic hemosiderin deposition from remote subarachnoid hemorrhage. Mild multifocal T2/FLAIR hyperintensity within the cerebral white matter is nonspecific, but compatible with chronic small vessel  ischemic disease. Chronic small vessel ischemic changes are also present within the pons. No midline shift. Vascular: Expected proximal arterial flow voids.  No focal Skull and upper cervical spine: No focal marrow lesion. Sinuses/Orbits: Visualized orbits show no acute finding. Trace bilateral ethmoid sinus mucosal thickening. IMPRESSION: Punctate acute infarct within the high left frontal lobe. New from the prior brain MRI of 01/17/2019, there is a small focus of extra-axial T2/FLAIR hyperintensity with corresponding restricted diffusion overlying the mid left frontal lobe. This is nonspecific but may reflect a trace extra-axial collection. Consider post-contrast MR imaging of the brain for further characterization. Redemonstrated chronic hemosiderin deposition along the mid to posterior left frontal lobe compatible with sequela of remote subarachnoid hemorrhage. Mild chronic small vessel ischemic disease within the cerebral white matter and pons and mild cerebral atrophy, stable as compared to the brain MRI of 01/17/2019. Electronically Signed   By: Kellie Simmering DO   On: 11/08/2020 18:01   MR BRAIN W CONTRAST  Result Date: 11/08/2020 CLINICAL DATA:   Left frontal infarct. Possible small extra-axial collection. EXAM: MRI HEAD WITH CONTRAST TECHNIQUE: Multiplanar, multiecho pulse sequences of the brain and surrounding structures were obtained with intravenous contrast. CONTRAST:  32mL GADAVIST GADOBUTROL 1 MMOL/ML IV SOLN COMPARISON:  Brain MRI without contrast 11/08/2020 FINDINGS: There is no abnormal contrast enhancement. No extra-axial collection. Otherwise, the examination is unchanged compared to the earlier brain MRI without contrast. IMPRESSION: No extra-axial collection or abnormal contrast enhancement. Electronically Signed   By: Ulyses Jarred M.D.   On: 11/08/2020 20:52   DG Chest Port 1 View  Result Date: 11/10/2020 CLINICAL DATA:  Pelvic patient's, altered mental status, discharged yesterday with diagnosis obstructing ext field radiograph 02/05/2012 EXAM: PORTABLE CHEST 1 VIEW COMPARISON:  Radiograph 02/05/2012 FINDINGS: Chronic hyperinflation with some coarsened interstitial changes which are likely chronic. No consolidative opacity or convincing features of edema. Vascularity remains well distributed. The aorta is calcified. The remaining cardiomediastinal contours are unremarkable. No acute osseous or soft tissue abnormality. IMPRESSION: Chronic hyperinflation and interstitial changes. No acute cardiopulmonary process. Electronically Signed   By: Lovena Le M.D.   On: 11/10/2020 06:36   ECHOCARDIOGRAM COMPLETE BUBBLE STUDY  Result Date: 11/09/2020    ECHOCARDIOGRAM REPORT   Patient Name:   Sarah Harper Date of Exam: 11/09/2020 Medical Rec #:  [id number]       Height:       65.5 in Accession #:    [id number]      Weight:       137.0 lb Date of Birth:  [birth year]      BSA:          1.694 m Patient Age:    54 years        BP:           164/87 mmHg Patient Gender: F               HR:           75 bpm. Exam Location:  [facility] Procedure: 2D Echo, Color Doppler, Cardiac Doppler and [place] Contrast Bubble            Study Indications:     I63.9  Stroke  History:         Patient has prior history of Echocardiogram examinations. Risk                  Factors:Sleep Apnea.  Sonographer:     Charmayne Sheer RDCS (AE) Referring Phys:  [id number] [place] Diagnosing Phys: Bartholome Bill MD IMPRESSIONS  1. Left ventricular ejection fraction, by estimation, is 55 to 60%. The left ventricle has normal function. The left ventricle has no regional wall motion abnormalities. Left ventricular diastolic parameters were normal.  2. Right ventricular systolic function is normal. The right ventricular size is normal.  3. Left atrial size was mildly dilated.  4. The mitral valve is grossly normal. Mild mitral valve regurgitation.  5. The aortic valve is tricuspid. Aortic valve regurgitation is trivial. FINDINGS  Left Ventricle: Left ventricular ejection fraction, by estimation, is 55 to 60%. The left ventricle has normal function. The left ventricle has no regional wall motion abnormalities. The left ventricular internal cavity size was normal in size. There is  borderline left ventricular hypertrophy. Left ventricular diastolic parameters were normal. Right Ventricle: The right ventricular size is normal. No increase in right ventricular wall thickness. Right ventricular systolic function is normal. Left Atrium: Left atrial size was mildly dilated. Right Atrium: Right atrial size was normal in size. Pericardium: There is no evidence of pericardial effusion. Mitral Valve: The mitral valve is grossly normal. Mild mitral valve regurgitation. MV peak gradient, 5.2 mmHg. The mean mitral valve gradient is 3.0 mmHg. Tricuspid Valve: The tricuspid valve is not well visualized. Tricuspid valve regurgitation is trivial. Aortic Valve: The aortic valve is tricuspid. Aortic valve regurgitation is trivial. Aortic valve mean gradient measures 4.0 mmHg. Aortic valve peak gradient measures 7.3 mmHg. Aortic valve area, by VTI measures 2.55 cm. Pulmonic Valve: The pulmonic valve was not well  visualized. Pulmonic valve regurgitation is mild. Aorta: The aortic arch was not well visualized. IAS/Shunts: The atrial septum is grossly normal. Agitated saline contrast was given intravenously to evaluate for intracardiac shunting.  LEFT VENTRICLE PLAX 2D LVIDd:         3.90 cm  Diastology LVIDs:         2.70 cm  LV e' medial:    7.62 cm/s LV PW:         0.80 cm  LV E/e' medial:  14.7 LV IVS:        0.90 cm  LV e' lateral:   7.18 cm/s LVOT diam:     1.90 cm  LV E/e' lateral: 15.6 LV SV:         74 LV SV Index:   44 LVOT Area:     2.84 cm  RIGHT VENTRICLE RV Basal diam:  2.60 cm LEFT ATRIUM             Index LA diam:        4.10 cm 2.42 cm/m LA Vol (A2C):   36.0 ml 21.25 ml/m LA Vol (A4C):   30.2 ml 17.83 ml/m LA Biplane Vol: 34.7 ml 20.48 ml/m  AORTIC VALVE                   PULMONIC VALVE AV Area (Vmax):    2.67 cm    PV Vmax:       0.84 m/s AV Area (Vmean):   2.66 cm    PV Vmean:      60.100 cm/s AV Area (VTI):     2.55 cm    PV VTI:        0.184 m AV Vmax:           135.00 cm/s PV Peak grad:  2.8 mmHg AV Vmean:          92.500 cm/s PV Mean grad:  2.0  mmHg AV VTI:            0.290 m AV Peak Grad:      7.3 mmHg AV Mean Grad:      4.0 mmHg LVOT Vmax:         127.00 cm/s LVOT Vmean:        86.800 cm/s LVOT VTI:          0.261 m LVOT/AV VTI ratio: 0.90  AORTA Ao Root diam: 3.30 cm MITRAL VALVE MV Area (PHT): 7.22 cm     SHUNTS MV Area VTI:   2.82 cm     Systemic VTI:  0.26 m MV Peak grad:  5.2 mmHg     Systemic Diam: 1.90 cm MV Mean grad:  3.0 mmHg MV Vmax:       1.14 m/s MV Vmean:      84.8 cm/s MV Decel Time: 105 msec MV E velocity: 112.00 cm/s MV A velocity: 112.00 cm/s MV E/A ratio:  1.00 Bartholome Bill MD Electronically signed by Bartholome Bill MD Signature Date/Time: 11/09/2020/8:15:58 PM    Final      Time coordinating discharge: Over 30 minutes    Dwyane Dee, MD  [facility] 11/10/2020, 8:43 AM  Late entry: patient seen and examined on 11/09/20.

## 2020-11-10 NOTE — ED Notes (Signed)
Pt disconnected from the monitor so that she could walk to the bathroom, pt is steady on her feet, husband asking about the alarm that they keep hearing and informed of bad weather notification not far away, husband given a remote to watch the weather if he wanted to

## 2020-11-10 NOTE — ED Notes (Signed)
Pt assisted to restroom.  

## 2020-11-10 NOTE — ED Notes (Signed)
Pt given her dinner tray and seated on the side of the bed eating without issue, husband at bedside with her

## 2020-11-10 NOTE — ED Notes (Signed)
Pt ambulatory to bathroom

## 2020-11-11 DIAGNOSIS — G9341 Metabolic encephalopathy: Secondary | ICD-10-CM | POA: Diagnosis not present

## 2020-11-11 DIAGNOSIS — I639 Cerebral infarction, unspecified: Secondary | ICD-10-CM

## 2020-11-11 DIAGNOSIS — R0789 Other chest pain: Secondary | ICD-10-CM | POA: Diagnosis not present

## 2020-11-11 LAB — CBC
HCT: 42.6 % (ref 36.0–46.0)
Hemoglobin: 14.4 g/dL (ref 12.0–15.0)
MCH: 29.5 pg (ref 26.0–34.0)
MCHC: 33.8 g/dL (ref 30.0–36.0)
MCV: 87.3 fL (ref 80.0–100.0)
Platelets: 259 10*3/uL (ref 150–400)
RBC: 4.88 MIL/uL (ref 3.87–5.11)
RDW: 13.5 % (ref 11.5–15.5)
WBC: 6.7 10*3/uL (ref 4.0–10.5)
nRBC: 0 % (ref 0.0–0.2)

## 2020-11-11 LAB — BASIC METABOLIC PANEL
Anion gap: 10 (ref 5–15)
BUN: 13 mg/dL (ref 8–23)
CO2: 22 mmol/L (ref 22–32)
Calcium: 9.8 mg/dL (ref 8.9–10.3)
Chloride: 102 mmol/L (ref 98–111)
Creatinine, Ser: 0.64 mg/dL (ref 0.44–1.00)
GFR, Estimated: >60 mL/min (ref >60)
Glucose, Bld: 136 mg/dL — ABNORMAL HIGH (ref 70–99)
Potassium: 3.6 mmol/L (ref 3.5–5.1)
Sodium: 134 mmol/L — ABNORMAL LOW (ref 135–145)

## 2020-11-11 MED ORDER — METOPROLOL TARTRATE 5 MG/5ML IV SOLN
INTRAVENOUS | Status: AC
  Administered 2020-11-11: 5 mg
  Filled 2020-11-11: qty 5

## 2020-11-11 MED ORDER — ALPRAZOLAM 0.25 MG PO TABS: 0.2500 mg | ORAL_TABLET | ORAL | Status: DC

## 2020-11-11 MED ORDER — ALPRAZOLAM 0.5 MG PO TABS: 0.5000 mg | ORAL_TABLET | ORAL | Status: AC

## 2020-11-11 MED ORDER — METOPROLOL TARTRATE 25 MG PO TABS: 25.0000 mg | ORAL_TABLET | Freq: Two times a day (BID) | ORAL | Status: DC

## 2020-11-11 MED ORDER — METOPROLOL TARTRATE 25 MG PO TABS: 25.0000 mg | ORAL_TABLET | Freq: Two times a day (BID) | ORAL | 0 refills | Status: DC

## 2020-11-11 MED ORDER — METOPROLOL TARTRATE 25 MG PO TABS
25.0000 mg | ORAL_TABLET | Freq: Two times a day (BID) | ORAL | Status: DC
  Administered 2020-11-11: 25 mg via ORAL
  Filled 2020-11-11: qty 1

## 2020-11-11 MED ORDER — METOPROLOL TARTRATE 5 MG/5ML IV SOLN: 5.0000 mg | Freq: Once | INTRAVENOUS | Status: AC

## 2020-11-11 MED ORDER — AMLODIPINE BESYLATE 5 MG PO TABS: 5.0000 mg | ORAL_TABLET | Freq: Every day | ORAL | 0 refills | Status: DC

## 2020-11-11 MED ORDER — ALPRAZOLAM 0.5 MG PO TABS
ORAL_TABLET | ORAL | Status: AC
  Administered 2020-11-11: 0.5 mg
  Filled 2020-11-11: qty 1

## 2020-11-11 NOTE — Discharge Summary (Signed)
Physician Discharge Summary  Patient ID: Sarah Harper MRN: [id number] DOB/AGE: Nov 03, 1940 80 y.o.  Admit date: 11/10/2020 Discharge date: 11/11/2020  Admission Diagnoses:  Discharge Diagnoses:  Principal Problem:   Chest discomfort Active Problems:   Hypothyroidism   GERD   Acute CVA (cerebrovascular accident) ([place])   Palpitations   Migraine   Hypokalemia   Acute metabolic encephalopathy   HLD (hyperlipidemia)   Anxiety   Hypertensive urgency   Discharged Condition: fair  Hospital Course:  DONNELL BEAUCHAMP is a 80 y.o. female with medical history significant hypertension, hyperlipidemia, GERD, hypothyroidism, anxiety, OSA, migraine headaches, IBS, newly diagnosed stroke, who presents with confusion, palpitation and chest discomfort.  Patient was recently hospitalized from 3/21-3/23 due to acute stroke. CTA head and neck was negative for LVO. MRI brain revealed left frontal infarct. Pt was discharged on dual antiplatelet therapy with aspirin and Plavix.  While in the hospital, patient developed significant anxiety overnight.  Her blood pressure went up with tachycardia, she received hydralazine, she states that everything went worse after hydralazine.  When I see the patient the morning, she is very anxious with a severe anxiety. Met patient and her husband in the bedside.  Patient is currently in a very difficult situation, she does not have any family members.  Husband is not healthy, she feels that she cannot get the help needed. However, patient is medically stable to be discharged.  Her high blood pressure is made worse by severe anxiety.  She is treated with Norvasc, I added metoprolol to try to reduce the heart rate. Will be followed by her family doctor, she probably needs placement in assisted living environment.  For now, I will send out home care with RN and nursing aides.  #1.  Chest discomfort with palpitations. Hypertension urgency Anxiety. Negative troponin.   This appears to be secondary to severe anxiety. Blood pressure is high, blood pressure medicines prescribed.  2.  Acute metabolic encephalopathy. Negative CT head scan. This appears to be secondary to poor sleep at home. She slept better last night even with episode of anxiety.  She is no longer confused today.  #3.  Hypokalemia. Potassium has normalized.    Consults: None  Significant Diagnostic Studies:   Treatments: Clinical monitoring, Bp meds  Discharge Exam: Blood pressure (!) 193/97, pulse (!) 110, temperature 98.6 F (37 C), temperature source Oral, resp. rate 20, height 5' 5.5" (1.664 m), weight 61.2 kg, SpO2 100 %. General appearance: alert and cooperative Resp: clear to auscultation bilaterally Cardio: Tachycardic, regular. GI: soft, non-tender; bowel sounds normal; no masses,  no organomegaly Extremities: extremities normal, atraumatic, no cyanosis or edema  Disposition: Discharge disposition: 01-Home or Self Care       Discharge Instructions    Diet - low sodium heart healthy   Complete by: As directed    Increase activity slowly   Complete by: As directed      Allergies as of 11/11/2020      Reactions   Nsaids    GI discomfort and gastritis   Tolmetin Other (See Comments)   GI discomfort and gastritis      Medication List    TAKE these medications   ALPRAZolam 0.25 MG tablet Commonly known as: XANAX Take 1 tablet (0.25 mg total) by mouth 2 (two) times daily as needed for anxiety (with caution of sedation).   amLODipine 5 MG tablet Commonly known as: NORVASC Take 1 tablet (5 mg total) by mouth daily.   aspirin 81 MG  EC tablet Take 1 tablet (81 mg total) by mouth daily. Swallow whole.   atorvastatin 40 MG tablet Commonly known as: LIPITOR Take 1 tablet (40 mg total) by mouth at bedtime.   b complex vitamins tablet Take 1 tablet by mouth daily.   butalbital-acetaminophen-caffeine 50-325-40 MG tablet Commonly known as: Fioricet Take 1  tablet by mouth every 6 (six) hours as needed.   CALCIUM 600 + D PO Take 1 tablet by mouth daily.   clopidogrel 75 MG tablet Commonly known as: PLAVIX Take 1 tablet (75 mg total) by mouth daily for 21 days.   Dexilant 60 MG capsule Generic drug: dexlansoprazole Take 1 capsule (60 mg total) by mouth daily.   diclofenac sodium 1 % Gel Commonly known as: VOLTAREN APPLY 2 GRAMS TOPICALLY FOUR TIMES A DAY TO AFFECTED AREAS What changed: See the new instructions.   estradiol 0.1 MG/GM vaginal cream Commonly known as: ESTRACE Insert small amount (1 cm) in vagina every other day   levothyroxine 88 MCG tablet Commonly known as: SYNTHROID Take 1 tablet (88 mcg total) by mouth daily before breakfast.   linaclotide 145 MCG Caps capsule Commonly known as: Linzess Take 1 capsule (145 mcg total) by mouth daily.   metoprolol tartrate 25 MG tablet Commonly known as: LOPRESSOR Take 1 tablet (25 mg total) by mouth 2 (two) times daily.   mometasone 50 MCG/ACT nasal spray Commonly known as: NASONEX Place 2 sprays into the nose daily as needed.   sucralfate 1 g tablet Commonly known as: CARAFATE TAKE 1 TABLET (1 G TOTAL) BY MOUTH 4 (FOUR) TIMES DAILY - WITH MEALS AND AT BEDTIME.       Follow-up Information    Tower, [name], MD Follow up in 1 week(s).   Specialties: Family Medicine, Radiology Contact information: [place] [address] [phone]        Kate Sable, MD .   Specialties: Cardiology, Radiology Contact information: [place] [address] [phone]               Signed: Sharen Hones 11/11/2020, 9:54 AM

## 2020-11-11 NOTE — Progress Notes (Signed)
Patient discharged per orders. PIV removed from patient. Patient off tele monitoring. VSS at time of discharge. AVS reviewed with patient/spouse; agreeable. Pt taken to medical mall via wheelchair by volunteer service.

## 2020-11-11 NOTE — Plan of Care (Signed)

## 2020-11-11 NOTE — Progress Notes (Signed)
  Cross Cover Patient post IV  hydralazine administration to treat hypertension had and acute anxiety episode with paranoia evidenced by her tachycardia, visible shaking and insistent that the hydralazine caused it and she felt like she was dying.   Took many explanations to educate patient on responses to meds, anxiety and delirium before she would agree to additional xanax to help with her anxiety and promotion of rest as well as IV metorprolol to treat her BP and heart rate.  Both improved with intervention  Review of chart indicates patient has been on xanax as needed for anxiety for quite ome time.  In visits this year that I was able to read there was not follow up on her generalized axiety.  Given her added stress with recent stroke diagnosis and hospitalizations, she may benefit on initiation of long acting antianxiety med such  as SSRI, SRNI

## 2020-11-12 NOTE — TOC Transition Note (Signed)
Transition of Care [facility]) - CM/SW Discharge Note   Patient Details  Name: Sarah Harper MRN: [id number] Date of Birth: 02-10-1941  Transition of Care [facility]) CM/SW Contact:  Kerin Salen, RN Phone Number: 11/12/2020, 1:10 PM   Clinical Narrative:   Discharge update, [facility] [place] called back to agree to provide [facility] for patient, PT/OT/RN/Aide. Spoke with [place], [phone].          Patient Goals and CMS Choice        Discharge Placement                       Discharge Plan and Services                                     Social Determinants of Health (SDOH) Interventions     Readmission Risk Interventions No flowsheet data found.

## 2020-11-15 ENCOUNTER — Telehealth: Payer: Self-pay | Admitting: Cardiology

## 2020-11-15 NOTE — Telephone Encounter (Signed)
Spoke with patient and she stated that she has been waking up every morning around 0400 with her heart pounding and it is causing her anxiety. When she took her BP this morning it was 160/90 and 152/90 at 0700. This was before she took her Metoprolol. I asked if she had taken yet and she stated that she had taken it about an hour ago. I had her retake her BP while on the phone with her and her BP was 126/83, P 65.    Patient was started on Amlodipine and Metoprolol last week after an ED visit. She stated that the ED doctor told her she had anxiety. She does have Alprazolam to use prn, and when I asked about using it, she stated that she has been nervous to use it with the new BP medications. I assured her that it would be safe to use when feeling the overwhelming anxiousness.   I recommended that she continue to monitor her BP daily at least one hour after taking her medications in the AM while sitting comfortably with both feet flat on the floor.  She stated that she will do this and write it down to bring with her to her follow up appointment on Friday this week 11/19/20.  Patient was grateful for the recommendations and stated that she felt better with the plan given to her.

## 2020-11-15 NOTE — Telephone Encounter (Signed)
Pt c/o BP issue: STAT if pt c/o blurred vision, one-sided weakness or slurred speech  1. What are your last 5 BP readings?     3/27  179/107   149/89  3/28  160/90  152/90   2. Are you having any other symptoms (ex. Dizziness, headache, blurred vision, passed out)? Slight headache left side forehead some dizziness this morning having trouble thinking of words getting worse.Patient recently admitted for stroke .  3. What is your BP issue? Patient concerned home bp's too high and she is worried she may have another stroke.  Patient states she went to the hospital for a second visit and was told she couldn't be admitted because there was nothing wrong with her heart and she could not be held for anxiety .

## 2020-11-17 ENCOUNTER — Other Ambulatory Visit: Payer: Self-pay

## 2020-11-17 ENCOUNTER — Ambulatory Visit (INDEPENDENT_AMBULATORY_CARE_PROVIDER_SITE_OTHER): Payer: Medicare HMO | Admitting: Family Medicine

## 2020-11-17 ENCOUNTER — Encounter: Payer: Self-pay | Admitting: Family Medicine

## 2020-11-17 VITALS — BP 130/80 | HR 76 | Temp 97.5°F | Ht 65.5 in | Wt 135.4 lb

## 2020-11-17 DIAGNOSIS — E2839 Other primary ovarian failure: Secondary | ICD-10-CM

## 2020-11-17 DIAGNOSIS — E039 Hypothyroidism, unspecified: Secondary | ICD-10-CM

## 2020-11-17 DIAGNOSIS — E7800 Pure hypercholesterolemia, unspecified: Secondary | ICD-10-CM

## 2020-11-17 DIAGNOSIS — K22719 Barrett's esophagus with dysplasia, unspecified: Secondary | ICD-10-CM | POA: Diagnosis not present

## 2020-11-17 DIAGNOSIS — G4733 Obstructive sleep apnea (adult) (pediatric): Secondary | ICD-10-CM

## 2020-11-17 DIAGNOSIS — K219 Gastro-esophageal reflux disease without esophagitis: Secondary | ICD-10-CM

## 2020-11-17 DIAGNOSIS — Z8673 Personal history of transient ischemic attack (TIA), and cerebral infarction without residual deficits: Secondary | ICD-10-CM | POA: Diagnosis not present

## 2020-11-17 DIAGNOSIS — I639 Cerebral infarction, unspecified: Secondary | ICD-10-CM

## 2020-11-17 MED ORDER — AMLODIPINE BESYLATE 5 MG PO TABS: 5.0000 mg | ORAL_TABLET | Freq: Every day | ORAL | 3 refills | Status: DC

## 2020-11-17 MED ORDER — METOPROLOL TARTRATE 25 MG PO TABS: 25.0000 mg | ORAL_TABLET | Freq: Two times a day (BID) | ORAL | 3 refills | Status: DC

## 2020-11-17 NOTE — Patient Instructions (Addendum)
Continue amlodipine, metoprolol, aspirin, plavix and lipitor (atorvastatin) at this time. Plan will be 3 weeks of aspirin + plavix then likely transition to just aspirin (around 12/02/2020).  We will refer you to Dr Manuella Ghazi neurology.  Keep monitoring blood pressures at home and let us know if any concerns.

## 2020-11-17 NOTE — Progress Notes (Signed)
Patient ID: Sarah Harper, female    DOB: Jul 31, 1941, 80 y.o.   MRN: [id number]  This visit was conducted in person.  BP 130/80   Pulse 76   Temp (!) 97.5 F (36.4 C) (Temporal)   Ht 5' 5.5" (1.664 m)   Wt 135 lb 7 oz (61.4 kg)   SpO2 100%   BMI 22.20 kg/m    CC: hosp f/u visit  Subjective:   HPI: Sarah Harper is a 80 y.o. female presenting on 11/17/2020 for Hospitalization Follow-up (Admitted on 11/10/20 to [facility], dx altered mental status.  Pt accompanied by husband, Erland- temp 97.8.)   Recent hospitalization for acute stroke 3/21. Records reviewed. Presented with transient difficulty with reading comprehension while at home (started 11/06/2020). She also had visual changes described as "artwork" with multiple shapes and colors throughout vision. MRI showed acute punctate L frontal infarct involving high L frontal lobe. Discharged on aspirin + plavix plan for 3 wks then monotherapy with aspirin.  Underwent stroke workup - unrevealing CTA head/neck, echocardiogram (EF 55%). LDL 109.   Returned to ER 3/23 with confusion, palpitations and chest discomfort thought to be anxiety related s/p overnight observation. Cardiac enzymes cycled and negative, BP meds adjusted (recently started on amlodipine and metoprolol).   Denies unilateral numbness/weakness or ongoing confusion since home.  Home BP readings overall stable at home. 130-150s/80-90s, HR 60s. This morning 126/81, HR 65  New Rx for amlodipine 5mg  and metoprolol 25mg  bid.  New Rx for atorvastatin 40mg  daily - this was continued.  New commencement of DAPT as well - she is worried about ability to tolerate aspirin in Barrett's history.   Has upcoming cardiology appt later this week with Dr Garen Lah.  Lives with husband.   H/o Barrett's on dexilant 60mg  daily. She had just started carafate Rx for GERD.  On estrace vaginally.  Hypothyroidism on levothyroxine 60mcg daily.  Has alprazolam to use PRN - last filled #30  02/2020 - sparing use.  Remote h/o migraines.    TCM hosp f/u phone call not completed Admit date: 11/08/2020 Discharge date: 11/09/2020   Admitted From: home Disposition:  home Discharging physician: Dwyane Dee, MD  Principal Diagnosis: Acute CVA (cerebrovascular accident) [facility])  Recommendations for Outpatient Follow-up:  1. Follow up with neurology 2. Follow up BP and may need medication if remains elevated 3. Follow up aspirin tolerance given history of Barrett's  Patient discharged to home in Discharge Condition: stable  CODE STATUS: Full Diet recommendation: general     Relevant past medical, surgical, family and social history reviewed and updated as indicated. Interim medical history since our last visit reviewed. Allergies and medications reviewed and updated. Outpatient Medications Prior to Visit  Medication Sig Dispense Refill  . ALPRAZolam (XANAX) 0.25 MG tablet Take 1 tablet (0.25 mg total) by mouth 2 (two) times daily as needed for anxiety (with caution of sedation). 30 tablet 0  . aspirin EC 81 MG EC tablet Take 1 tablet (81 mg total) by mouth daily. Swallow whole. 30 tablet 11  . atorvastatin (LIPITOR) 40 MG tablet Take 1 tablet (40 mg total) by mouth at bedtime. 90 tablet 3  . b complex vitamins tablet Take 1 tablet by mouth daily.    . butalbital-acetaminophen-caffeine (FIORICET) 50-325-40 MG tablet Take 1 tablet by mouth every 6 (six) hours as needed. 60 tablet 0  . Calcium Carb-Cholecalciferol (CALCIUM 600 + D PO) Take 1 tablet by mouth daily.    . clopidogrel (PLAVIX) 75  MG tablet Take 1 tablet (75 mg total) by mouth daily for 21 days. 21 tablet 0  . dexlansoprazole (DEXILANT) 60 MG capsule Take 1 capsule (60 mg total) by mouth daily. 90 capsule 3  . diclofenac sodium (VOLTAREN) 1 % GEL APPLY 2 GRAMS TOPICALLY FOUR TIMES A DAY TO AFFECTED AREAS (Patient taking differently: Apply 2 g topically 4 (four) times daily.) 300 g 3  . estradiol (ESTRACE) 0.1 MG/GM  vaginal cream Insert small amount (1 cm) in vagina every other day 42.5 g 3  . levothyroxine (SYNTHROID) 88 MCG tablet Take 1 tablet (88 mcg total) by mouth daily before breakfast. 90 tablet 3  . linaclotide (LINZESS) 145 MCG CAPS capsule Take 1 capsule (145 mcg total) by mouth daily. 90 capsule 3  . mometasone (NASONEX) 50 MCG/ACT nasal spray Place 2 sprays into the nose daily as needed.    . sucralfate (CARAFATE) 1 g tablet TAKE 1 TABLET (1 G TOTAL) BY MOUTH 4 (FOUR) TIMES DAILY - WITH MEALS AND AT BEDTIME. 120 tablet 2  . amLODipine (NORVASC) 5 MG tablet Take 1 tablet (5 mg total) by mouth daily. 30 tablet 0  . metoprolol tartrate (LOPRESSOR) 25 MG tablet Take 1 tablet (25 mg total) by mouth 2 (two) times daily. 30 tablet 0   No facility-administered medications prior to visit.     Per HPI unless specifically indicated in ROS section below Review of Systems Objective:  BP 130/80   Pulse 76   Temp (!) 97.5 F (36.4 C) (Temporal)   Ht 5' 5.5" (1.664 m)   Wt 135 lb 7 oz (61.4 kg)   SpO2 100%   BMI 22.20 kg/m   Wt Readings from Last 3 Encounters:  11/17/20 135 lb 7 oz (61.4 kg)  11/10/20 135 lb (61.2 kg)  11/08/20 137 lb (62.1 kg)      Physical Exam Vitals and nursing note reviewed.  Constitutional:      Appearance: Normal appearance. She is not ill-appearing.  Neck:     Vascular: No carotid bruit.  Cardiovascular:     Rate and Rhythm: Normal rate and regular rhythm.     Pulses: Normal pulses.     Heart sounds: Normal heart sounds. No murmur heard.   Pulmonary:     Effort: Pulmonary effort is normal. No respiratory distress.     Breath sounds: Normal breath sounds. No wheezing, rhonchi or rales.  Musculoskeletal:     Cervical back: Normal range of motion. No rigidity.     Right lower leg: No edema.     Left lower leg: No edema.  Lymphadenopathy:     Cervical: No cervical adenopathy.  Skin:    General: Skin is warm and dry.     Findings: No rash.  Neurological:      General: No focal deficit present.     Mental Status: She is alert.     Cranial Nerves: Cranial nerves are intact.     Sensory: Sensation is intact.     Motor: Motor function is intact.     Coordination: Coordination is intact. Romberg sign negative. Finger-Nose-Finger Test normal.     Gait: Gait is intact.     Comments:  CN 2-12 intact FTN intact EOMI No pronator drift  Psychiatric:        Mood and Affect: Mood normal.        Behavior: Behavior normal.       Lab Results  Component Value Date   TSH 3.695 11/10/2020  Lab Results  Component Value Date   HGBA1C 5.7 (H) 11/09/2020    Lab Results  Component Value Date   CHOL 219 (H) 11/09/2020   HDL 97 11/09/2020   LDLCALC 109 (H) 11/09/2020   LDLDIRECT 115.5 02/12/2013   TRIG 67 11/09/2020   CHOLHDL 2.3 11/09/2020    Lab Results  Component Value Date   CREATININE 0.64 11/11/2020   BUN 13 11/11/2020   NA 134 (L) 11/11/2020   K 3.6 11/11/2020   CL 102 11/11/2020   CO2 22 11/11/2020    Lab Results  Component Value Date   WBC 6.7 11/11/2020   HGB 14.4 11/11/2020   HCT 42.6 11/11/2020   MCV 87.3 11/11/2020   PLT 259 11/11/2020    Assessment & Plan:  This visit occurred during the SARS-CoV-2 public health emergency.  Safety protocols were in place, including screening questions prior to the visit, additional usage of staff PPE, and extensive cleaning of exam room while observing appropriate contact time as indicated for disinfecting solutions.   Problem List Items Addressed This Visit    Hypothyroidism    Continue levothyroxine 27mcg      Relevant Medications   metoprolol tartrate (LOPRESSOR) 25 MG tablet   GERD    Continue dexilant. H/o Barrett's followed by Carlean Purl.       BARRETTS ESOPHAGUS   Hyperlipidemia    Now on atorvastatin 40mg  daily.       Relevant Medications   amLODipine (NORVASC) 5 MG tablet   metoprolol tartrate (LOPRESSOR) 25 MG tablet   OSA (obstructive sleep apnea)   Estrogen  deficiency    On topical estrogen therapy.       Acute CVA (cerebrovascular accident) ([place]) - Primary    MRI 10/2020 showed acute punctate L high frontal infarct. Stroke workup unrevealing (echo, CTA head/neck, labwork).  Reviewed planned statin, DAPT x3wks then monotherapy with aspirin, as well as blood pressure control - continue amlodipine/metoprolol at this time.  She will continue to monitor BP closely at home like up to now, update [place] if trending up.  Recent readings seem to be stabilizing on current regimen of amlodipine 5mg  qd and metoprolol 25mg  bid.  LDL 109 pre-statin. New goal <70.  Will also refer to neurology. She has previously seen Dr Manuella Ghazi She has PCP f/u scheduled for this summer.      Relevant Medications   amLODipine (NORVASC) 5 MG tablet   metoprolol tartrate (LOPRESSOR) 25 MG tablet   Other Relevant Orders   Ambulatory referral to Neurology       Meds ordered this encounter  Medications  . amLODipine (NORVASC) 5 MG tablet    Sig: Take 1 tablet (5 mg total) by mouth daily.    Dispense:  30 tablet    Refill:  3  . metoprolol tartrate (LOPRESSOR) 25 MG tablet    Sig: Take 1 tablet (25 mg total) by mouth 2 (two) times daily.    Dispense:  60 tablet    Refill:  3   Orders Placed This Encounter  Procedures  . Ambulatory referral to Neurology    Referral Priority:   Routine    Referral Type:   Consultation    Referral Reason:   Specialty Services Required    Requested Specialty:   Neurology    Number of Visits Requested:   1    Patient Instructions  Continue amlodipine, metoprolol, aspirin, plavix and lipitor (atorvastatin) at this time. Plan will be 3 weeks of aspirin + plavix  then likely transition to just aspirin (around 12/02/2020).  We will refer you to Dr Manuella Ghazi neurology.  Keep monitoring blood pressures at home and let us know if any concerns.   Follow up plan: Return if symptoms worsen or fail to improve.  Ria Bush, MD

## 2020-11-18 NOTE — Assessment & Plan Note (Signed)
On topical estrogen therapy.

## 2020-11-18 NOTE — Assessment & Plan Note (Addendum)
MRI 10/2020 showed acute punctate L high frontal infarct. Stroke workup unrevealing (echo, CTA head/neck, labwork).  Reviewed planned statin, DAPT x3wks then monotherapy with aspirin, as well as blood pressure control - continue amlodipine/metoprolol at this time.  She will continue to monitor BP closely at home like up to now, update [place] if trending up.  Recent readings seem to be stabilizing on current regimen of amlodipine 5mg  qd and metoprolol 25mg  bid.  LDL 109 pre-statin. New goal <70.  Will also refer to neurology. She has previously seen Dr Manuella Ghazi She has PCP f/u scheduled for this summer.

## 2020-11-18 NOTE — Assessment & Plan Note (Signed)
Continue levothyroxine 88 mcg

## 2020-11-18 NOTE — Assessment & Plan Note (Signed)
Now on atorvastatin 40mg  daily.

## 2020-11-18 NOTE — Assessment & Plan Note (Signed)
Continue dexilant. H/o Barrett's followed by Carlean Purl.

## 2020-11-19 ENCOUNTER — Ambulatory Visit: Payer: Medicare HMO | Admitting: Physician Assistant

## 2020-11-19 ENCOUNTER — Telehealth: Payer: Self-pay | Admitting: Cardiology

## 2020-11-19 ENCOUNTER — Other Ambulatory Visit: Payer: Self-pay

## 2020-11-19 ENCOUNTER — Encounter: Payer: Self-pay | Admitting: Physician Assistant

## 2020-11-19 VITALS — BP 122/80 | HR 58 | Ht 65.5 in | Wt 135.0 lb

## 2020-11-19 DIAGNOSIS — I499 Cardiac arrhythmia, unspecified: Secondary | ICD-10-CM

## 2020-11-19 DIAGNOSIS — R600 Localized edema: Secondary | ICD-10-CM

## 2020-11-19 DIAGNOSIS — R002 Palpitations: Secondary | ICD-10-CM

## 2020-11-19 DIAGNOSIS — I8393 Asymptomatic varicose veins of bilateral lower extremities: Secondary | ICD-10-CM | POA: Diagnosis not present

## 2020-11-19 DIAGNOSIS — I872 Venous insufficiency (chronic) (peripheral): Secondary | ICD-10-CM | POA: Diagnosis not present

## 2020-11-19 MED ORDER — PANTOPRAZOLE SODIUM 40 MG PO TBEC: 40.0000 mg | DELAYED_RELEASE_TABLET | Freq: Every day | ORAL | 5 refills | Status: DC

## 2020-11-19 MED ORDER — METOPROLOL TARTRATE 25 MG PO TABS: ORAL_TABLET | ORAL | Status: DC

## 2020-11-19 MED ORDER — METOPROLOL SUCCINATE ER 50 MG PO TB24: 50.0000 mg | ORAL_TABLET | Freq: Every day | ORAL | 3 refills | Status: DC

## 2020-11-19 MED ORDER — METOPROLOL SUCCINATE ER 50 MG PO TB24: 50.0000 mg | ORAL_TABLET | Freq: Every day | ORAL | 5 refills | Status: DC

## 2020-11-19 MED ORDER — PANTOPRAZOLE SODIUM 40 MG PO TBEC: 40.0000 mg | DELAYED_RELEASE_TABLET | Freq: Every day | ORAL | 3 refills | Status: DC

## 2020-11-19 NOTE — Progress Notes (Signed)
Office Visit    Patient Name: Sarah Harper Date of Encounter: 11/19/2020  PCP:  Abner Greenspan, MD   [place]  Cardiologist:  Kate Sable, MD  Advanced Practice Provider:  No care team member to display Electrophysiologist:  None  :[id number]}   Chief Complaint    Chief Complaint  Patient presents with  . Hospitalization Follow-up    Hospital follow up for altered mental status and palpitations. C/o blood pressure and heart rate fluctuating. Medications verbally reviewed with patient.     80 year old female with history of hypertension, hyperlipidemia, hypothyroidism, OSA on CPAP, GERD, anxiety, migraine headaches, IBS, newly diagnosed stroke, tachypalpitations, and who is seen today for hospital follow-up and with report of tachypalpitations and labile BP.    Past Medical History    Past Medical History:  Diagnosis Date  . DDD (degenerative disc disease)    chronic back pain  . ETD (eustachian tube dysfunction)   . GERD (gastroesophageal reflux disease)   . IBS (irritable bowel syndrome)   . Meniere's disease   . Migraine    still gets visual aura from time to time  . Osteoporosis   . Sleep apnea    CPAP  . Syncopal episodes    after work-up - possible seizures?  . Thyroid disease    hypothyroid   Past Surgical History:  Procedure Laterality Date  . APPENDECTOMY    . CATARACT EXTRACTION W/PHACO Right 05/21/2019   Procedure: CATARACT EXTRACTION PHACO AND INTRAOCULAR LENS PLACEMENT ([place]) RIGHT panoptix lens  00:35.0  21.7%  7.61;  Surgeon: Leandrew Koyanagi, MD;  Location: [facility];  Service: Ophthalmology;  Laterality: Right;  sleep apnea requests early  . CATARACT EXTRACTION W/PHACO Left 06/11/2019   Procedure: CATARACT EXTRACTION PHACO AND INTRAOCULAR LENS PLACEMENT ([facility]) LEFT PANOPTIX TORIC LENS  00:50.0  20.3%  10.21;  Surgeon: Leandrew Koyanagi, MD;  Location: [facility];  Service: Ophthalmology;   Laterality: Left;  sleep apnea requests early  . COLONOSCOPY     last 2012 - [facility]  . ESOPHAGOGASTRODUODENOSCOPY     Multiple, last 01/07/2017 with Savary dilation to 18 mm  . HEMORRHOID BANDING  2018   [facility]  . LUMBAR DISC SURGERY  1984   L5     Allergies  Allergies  Allergen Reactions  . Nsaids     GI discomfort and gastritis  . Tolmetin Other (See Comments)    GI discomfort and gastritis    History of Present Illness    Sarah Harper is a 80 y.o. female with PMH as above.  She notes family history of a nephew that had his heart valve replaced in his 53s and deceased from a pulmonary embolism.  She has history of tachypalpitations on review of previous [facility] documentation.  She underwent 2014 monitor, which showed NSR with frequent PACs and occasional PVCs.  10/2020 echocardiogram with bubble study as below showed normal LVEF, read by an outside cardiologist.  Previous carotid study as below with less than 40% narrowing.  Most recent CTA of head and neck showed similar plaque findings at the right greater than left ICA origins with less than 50% stenosis.  No proximal intracranial vessel occlusion was noted.  She was last seen by her primary cardiologist 03/22/2020.  At that time, she reported lower extremity edema and symptoms consistent with venous insufficiency.  Lower extremity venous ultrasound showed reflux.  At RTC, she reported prednisone for right ankle swelling.  Compression stockings  were helping with her symptoms.  She was most recently seen in at [facility] hospital 3/21-3/23 and 3/23-3/24 or CVA and strokelike symptoms.  As above, 3/21 CTA of head and neck was performed and without any evidence of occlusion.  3/21 MRI brain revealed left frontal lobe punctate acute infarct and small vessel disease.  She was evaluated by neurology with recommendation for DAPT for 21 days, followed by ASA monotherapy and lifelong statin..  Event monitor was recommended.  Echo performed showed  normal LVEF and no intracardiac shunt.  She was discharged on DAPT with ASA and Plavix.  While in the hospital, she developed significant anxiety with BP elevated.  BP was also noted to be labile on review of hospital pressures.  She did not tolerate hydralazine.  She reported stress concerning her husband.  Metoprolol was added to her medication regimen.  At discharge, blood pressure 193/97 with HR 110 bpm.  Also noted was discharge potassium of 3.6.  Of note, cardiology was not consulted during either admission.  Today, 11/19/2020, she returns to clinic and notes she has not been feeling well.  No chest pain or shortness of breath/dyspnea.  No exertional symptoms.  She has noted reduced appetite and that her weight has been decreasing.  She wears a CPAP and has been compliant, though she frequently wakes up at 4 AM out of a dream with her heart racing.  She does not feel short of breath when she wakes from sleep.  She is also noted racing heart rate at other times, though unable to pinpoint a trigger.  She denies any edema, noting resolution of previously reported edema.  She reports staying well-hydrated with 5 glasses of water per day.  She denies any increase in salt with occasional low salt potato chips eaten as a treat.  She does not eat sugary foods, noting the occasional hot chocolate that is sugar-free.  No alcohol use (previously drinking wine).  She only drinks caffeine as a treat.   After taking her ASA and Plavix, she notes nausea and a burning that starts in her stomach and goes up her throat.  She does not feel this with eating.  She denies any presyncope or syncope.  No signs or symptoms of bleeding.  Addendum: After clinic, it has been clarified she is already on a PPI, dexlansoprazole  Home Medications    Current Outpatient Medications on File Prior to Visit  Medication Sig Dispense Refill  . ALPRAZolam (XANAX) 0.25 MG tablet Take 1 tablet (0.25 mg total) by mouth 2 (two) times daily as  needed for anxiety (with caution of sedation). 30 tablet 0  . amLODipine (NORVASC) 5 MG tablet Take 1 tablet (5 mg total) by mouth daily. 30 tablet 3  . aspirin EC 81 MG EC tablet Take 1 tablet (81 mg total) by mouth daily. Swallow whole. 30 tablet 11  . atorvastatin (LIPITOR) 40 MG tablet Take 1 tablet (40 mg total) by mouth at bedtime. 90 tablet 3  . b complex vitamins tablet Take 1 tablet by mouth daily.    . butalbital-acetaminophen-caffeine (FIORICET) 50-325-40 MG tablet Take 1 tablet by mouth every 6 (six) hours as needed. 60 tablet 0  . Calcium Carb-Cholecalciferol (CALCIUM 600 + D PO) Take 1 tablet by mouth daily.    . clopidogrel (PLAVIX) 75 MG tablet Take 1 tablet (75 mg total) by mouth daily for 21 days. 21 tablet 0  . dexlansoprazole (DEXILANT) 60 MG capsule Take 1 capsule (60 mg total) by  mouth daily. 90 capsule 3  . diclofenac sodium (VOLTAREN) 1 % GEL APPLY 2 GRAMS TOPICALLY FOUR TIMES A DAY TO AFFECTED AREAS 300 g 3  . estradiol (ESTRACE) 0.1 MG/GM vaginal cream Insert small amount (1 cm) in vagina every other day 42.5 g 3  . levothyroxine (SYNTHROID) 88 MCG tablet Take 1 tablet (88 mcg total) by mouth daily before breakfast. 90 tablet 3  . linaclotide (LINZESS) 145 MCG CAPS capsule Take 1 capsule (145 mcg total) by mouth daily. 90 capsule 3  . mometasone (NASONEX) 50 MCG/ACT nasal spray Place 2 sprays into the nose daily as needed.    . sucralfate (CARAFATE) 1 g tablet TAKE 1 TABLET (1 G TOTAL) BY MOUTH 4 (FOUR) TIMES DAILY - WITH MEALS AND AT BEDTIME. 120 tablet 2   No current facility-administered medications on file prior to visit.    Review of Systems    She denies chest pain, dyspnea, pnd, orthopnea, v, dizziness, syncope, edema, weight gain.  She reports resolution of previous edema.  She reports reduced appetite and wt loss, racing heart rate that wakes her from sleep at 4 AM, decreased weight, and reflux symptoms/pill esophagitis associated with ASA and Plavix.   All  other systems reviewed and are otherwise negative except as noted above.  Physical Exam    VS:  BP 122/80 (BP Location: Left Arm, Patient Position: Sitting, Cuff Size: Normal)   Pulse (!) 58   Ht 5' 5.5" (1.664 m)   Wt 135 lb (61.2 kg)   SpO2 97%   BMI 22.12 kg/m  , BMI Body mass index is 22.12 kg/m. GEN: Thin and elderly female, in no acute distress.  Mask in place.  Joined by her husband. HEENT: normal. Neck: Supple, no JVD, carotid bruits, or masses. Cardiac: Bradycardic but regular, 2/6 systolic murmur, rubs, or gallops. No clubbing, cyanosis, edema.  Radials/DP/PT 2+ and equal bilaterally.  Respiratory:  Respirations regular and unlabored, clear to auscultation bilaterally. GI: Soft, nontender, nondistended, BS + x 4. MS: no deformity or atrophy. Skin: warm and dry, no rash. Neuro:  Strength and sensation are intact. Psych: Normal affect.  Accessory Clinical Findings    ECG personally reviewed by me today - SB at 58bpm with 1st degree AVB and PRi 214, QRS 92 ms, T wave inversion noted in 1, aVL, V6, poor R wave progression - compare with 11/08/20 EKG- no acute changes.  VITALS Reviewed today   Temp Readings from Last 3 Encounters:  11/17/20 (!) 97.5 F (36.4 C) (Temporal)  11/11/20 98.6 F (37 C) (Oral)  11/09/20 98 F (36.7 C) (Oral)   BP Readings from Last 3 Encounters:  11/19/20 122/80  11/17/20 130/80  11/11/20 (!) 151/82   Pulse Readings from Last 3 Encounters:  11/19/20 (!) 58  11/17/20 76  11/11/20 70    Wt Readings from Last 3 Encounters:  11/19/20 135 lb (61.2 kg)  11/17/20 135 lb 7 oz (61.4 kg)  11/10/20 135 lb (61.2 kg)     LABS  reviewed today   Lab Results  Component Value Date   WBC 6.7 11/11/2020   HGB 14.4 11/11/2020   HCT 42.6 11/11/2020   MCV 87.3 11/11/2020   PLT 259 11/11/2020   Lab Results  Component Value Date   CREATININE 0.64 11/11/2020   BUN 13 11/11/2020   NA 134 (L) 11/11/2020   K 3.6 11/11/2020   CL 102 11/11/2020    CO2 22 11/11/2020   Lab Results  Component Value  Date   ALT 20 11/10/2020   AST 27 11/10/2020   ALKPHOS 82 11/10/2020   BILITOT 1.0 11/10/2020   Lab Results  Component Value Date   CHOL 219 (H) 11/09/2020   HDL 97 11/09/2020   LDLCALC 109 (H) 11/09/2020   LDLDIRECT 115.5 02/12/2013   TRIG 67 11/09/2020   CHOLHDL 2.3 11/09/2020    Lab Results  Component Value Date   HGBA1C 5.7 (H) 11/09/2020   Lab Results  Component Value Date   TSH 3.695 11/10/2020     STUDIES/PROCEDURES reviewed today   Echocardiogram 11/09/2020 1. Left ventricular ejection fraction, by estimation, is 55 to 60%. The  left ventricle has normal function. The left ventricle has no regional  wall motion abnormalities. Left ventricular diastolic parameters were  normal.  2. Right ventricular systolic function is normal. The right ventricular  size is normal.  3. Left atrial size was mildly dilated.  4. The mitral valve is grossly normal. Mild mitral valve regurgitation.  5. The aortic valve is tricuspid. Aortic valve regurgitation is trivial.   Carotid study 02/06/2015 Notes Recorded by Leonie Man, MD on 02/06/2015 at 8:05 PM Normal Carotid Dopplers - < 40% narrowing.  2014 Transcribed via dictation from uploaded scan: 48-hour cardiac monitoring in 2014 showed NSR with frequent PACs and rare PVCs.  On review of EMR scan, heart rate varied from 114 bpm to 62 bpm.  Pauses were noted at 17 the longest of which was 29.1 seconds and given no attached data, suspect this is possibly an error.  SVT runs equal 48 the longest of which is 5 beats and at a maximum rate of 192 bpm.  Ventricular couplets were present  Assessment & Plan    Racing heart rate --Reports waking up in the middle of the night with racing heart rate.  On further questioning, she does note racing heart rate periodically throughout the day without clear triggers.  Previous 2014 cardiac monitoring as above and without detailed strips,  though noted to be without significant arrhythmia.  Discussed recommendation for Zio versus loop recorder/EP referral at this time given her CVA.  Zio monitoring deferred at this time, given preference to avoid adhesive with thin skin, as well as deferral of loop given her report of cardiac monitoring during admissions. Will reassess at RTC, with preference for Loop and TEE ordered at RTC if pt agreeable. Barrett's esophagus will have to be considered with TEE. Transitioned to long-acting Toprol 50 mg daily, which may provide more even coverage during the evening hours.  For breakthrough tachypalpitations, if HR above 60 and SBP above 100, 1/2 tablet of lopressor 25mg  can be used twice daily (total 1 tablet Lopressor 25 mg qd) with close monitoring of first degree AVB and HR at RTC. Repeat potassium labs per PCP --would recommend BMET at RTC to [facility] if not yet done at RTC, will recheck.  Most recent TSH WNL. We discussed performing a vasovagal maneuver the next time she has tachypalpitations.    Labile blood pressure --BP today well controlled today with EMR showing labile pressure, as well as her home log. On review of EMR, low BP / orthostasis was an issue in the past. Diet reviewed, including recommendation for fluids under 2 L and salt under 2 g/day.  BP monitoring discussed in detail. As above, we transitioned her to Toprol 50 mg daily today with PRN lopressor and hold precautions.  Given her Barrett's esophagus / pill esophagitis issues as outlined above, if room  in BP at RTC, consider addition of Imdur.  Abnormal EKG --No chest pain or shortness of breath.  She does report racing heart rate and labile blood pressure.  T wave inversion noted in the lateral leads of 3/21 EKGs.    At RTC, recommend discussing coronary CT versus stress testing  And once recovered from her most recent CVA and elevated BP admission (preference for coronary CT at this time). Risk factors for coronary insufficiency include  age, hypertension, hyperlipidemia. Aggressive risk factor modification recommended. Continue ASA (DAPT until 4/14), BB, statin. Consider addition of Imdur at RTC.   History of CVA --On review of EMR, it appears as if her first 3/21 admission showed punctate acute infarct involving the high left frontal lobe.  She was evaluated by neurology during her admission but cardiology not consulted.  TEE ordered but then cancelled. CTA performed without LVO or significant carotid stenosis. As above, discussed loop and Zio cardiac monitoring with pt preference to defer today and reassess at RTC, as preference would be for EP referral /loop without clear etiology of CVA.  TEE should be discussed if esophagus allows.  Echo during her admission showed normal LVEF, no PFO, no WMA.  Agree with neurology follow-up at her earliest convenience (referral placed at discharge).  Agree with restart of statin with further recommendations directly below.  Recommend risk factor modification with BP, heart rate, and cholesterol control.  Continue ASA 81 mg indefinitely, Plavix through 4/13, and statin. Continue current beta-blocker with close monitoring of first-degree AV block and heart rate.  Hyperlipidemia, LDL goal <70 -11/09/2020 total cholesterol 219 with LDL 109 and not at goal of below 70.  Statin has been stopped and started - unclear if gaps in therapy.  Recommend repeat lipid and liver in 6 to 8 weeks.  If LDL still not at goal, increase of atorvastatin to 80 mg versus addition of Zetia recommended at that time.  Barrett's esophagus/GERD --Reports reflux symptoms associated with taking ASA 81 mg and Plavix 75 mg daily.  She denies any signs or symptoms of bleeding on ASA and Plavix.  She is scheduled to discontinue Plavix 75 mg daily after her fourth/13 dose and continue with monotherapy of ASA starting 4/14.  With continued symptoms, recommend following up with GI, as she may need repeat studies.  Continue current PPI and  GI medications as prescribed by PCP/GI.  It has been clarified she is already on a PPI, which she will continue instead of Protonix for now. At RTC, if room in BP, recommend addition of Imdur.  First-degree AV block/bradycardia -Asymptomatic with bradycardia.  First-degree AV block noted with addition of beta-blocker.  As above, recommend monitor EKG closely on current beta-blocker therapy.  Given her history of lower BP in the past, recommend close monitoring of BP, heart rate, and EKG.  Lower extremity edema, resolved --Reports resolution of previous edema that worsened throughout the day.  When last seen in office, varicose veins were noted on exams and symptoms attributed to venous insufficiency.  Continue to recommend elevation, compression stockings, and Ace wraps as needed.  Sleep apnea on CPAP --Continue CPAP.  Discussed the importance of ongoing CPAP use, as untreated sleep apnea is associated with cardiovascular findings including atrial fibrillation.  Elevated hemoglobin A1c --Hemoglobin A1c 5.7 on 3/22 labs.  Recommend diet lifestyle changes.  Recheck per PCP.  Glycemic control recommended from cardiovascular standpoint.  Vascular disease -Follow up with [facility] as per scheduled. Continue to monitor carotids / subclavian /  LE. Recommend ASA, Plavix until 4/14, statin.   Medication changes: Toprol 50mg  with lopressor now PRN 1/2 to max 1 tablet 25mg  lopressor per day for breakthrough racing HR. Continue her current PPI (clarified she is already on a PPI after her visit today). We will not start Protonix, given she is already on a PPI (dexlansoprazole).  Labs ordered: At RTC, CMET if not yet performed. LDL to confirm if statin effective. Studies / Imaging ordered: Deferred per pt preference and will be reassessed at RTC Future considerations: Stress testing versus cardiac CTA, loop / EP (Zio likely will tear skin), TEE, repeat EKG at RTC. Consider Imdur. BB changes if needed based on repeat  EKG. CMET and LDL a t6-8 weeks EKG at RTC: Yes Disposition: RTC 2 weeks    Arvil Chaco, PA-C 11/19/2020

## 2020-11-19 NOTE — Telephone Encounter (Signed)
She should not take two PPIs.  Please have her continue dexlansoprazole.  This does not interact with her Plavix.   Discontinue Protonix. I appreciate her reaching out to clarify this information, as her discharge summary says they sent her home with Protonix, but many medications were reported as stopped or started at that time.   Patient FYI: On review of her neurology notes from her latest admission, she should only remain on Plavix 75mg  for 21 days (last dose would be 12/01/20). Starting 4/14, she should discontinue Plavix and continue monotherapy with ASA 81mg . Please make sure she knows this information.  In the meantime, I recommend she touch base with GI regarding her symptoms to ensure they do not feel she needs a repeat study.

## 2020-11-19 NOTE — Telephone Encounter (Signed)
Patient saw Sarah Harper today  Needs a medication clarification  Please call to discuss

## 2020-11-19 NOTE — Telephone Encounter (Signed)
Spoke to pt. She was started on Protonix 40mg  today in office. Pt wanted to make [place] aware that she is also on another proton pump inhibitor, Dexilant 60mg  daily, and has been for years.  She wants clarification as to whether she is to take two PPI's.  Advised pt to hold off on starting Protonix that was new Rx today, and will clarify with Jacquelyn.  Pt verbalized understanding.

## 2020-11-19 NOTE — Patient Instructions (Addendum)
Medication Instructions:  Your physician has recommended you make the following change in your medication:   START Protonix 40mg  daily   START Metoprolol Succinate (long acting) 50mg  daily  CHANGE Metoprolol Tartrate (Short acting) to taking HALF tablet to ONE tablet daily AS NEEDED only for breakthrough palpitations or racing heart rate. Maximum of 1 tablet daily.  Do not take Metoprolol if top number of blood pressure is less than 100 and/or heart rate is less than 60.   *If you need a refill on your cardiac medications before your next appointment, please call your pharmacy*   Lab Work: None ordered   Testing/Procedures: None ordered   Follow-Up: At [facility], you and your health needs are our priority.  As part of our continuing mission to provide you with exceptional heart care, we have created designated Provider Care Teams.  These Care Teams include your primary Cardiologist (physician) and Advanced Practice Providers (APPs -  Physician Assistants and Nurse Practitioners) who all work together to provide you with the care you need, when you need it.  We recommend signing up for the patient portal called "MyChart".  Sign up information is provided on this After Visit Summary.  MyChart is used to connect with patients for Virtual Visits (Telemedicine).  Patients are able to view lab/test results, encounter notes, upcoming appointments, etc.  Non-urgent messages can be sent to your provider as well.   To learn more about what you can do with MyChart, go to [email or web address].    Your next appointment:   2 week(s)  The format for your next appointment:   In Person  Provider:   You may see Kate Sable, MD or one of the following Advanced Practice Providers on your designated Care Team:    Murray Hodgkins, NP  Christell Faith, PA-C  Marrianne Mood, PA-C  Cadence Kathlen Mody, [place]  Laurann Montana, NP    Other Instructions  Further Instructions: Let your GI  doctor know about starting Protonix. Switch to a calcium supplement. Make sure the calcium does not also have vitamin D in it (since you are taking vitamin D already), so that you are not taking too much vitamin D, as this can build up in the body.

## 2020-11-22 MED ORDER — CLOPIDOGREL BISULFATE 75 MG PO TABS: 75.0000 mg | ORAL_TABLET | Freq: Every day | ORAL | 0 refills | Status: AC

## 2020-11-22 NOTE — Telephone Encounter (Signed)
Spoke to pt. Notified of provider's recc below.  Pt verbalized understanding.  She will discontinue Protonix. She did not start med.  She will continue to take Dexilant (Dexlansoprazole) and is aware of benefit that it does not interact with Plavix.   Pt also confirmed that her Rx bottle for Plavix does note stop date of 12/01/20.  Pt will take last dose of Plavix 75mg  on 12/01/20 and voices that she will continue monotherapy ASA EC 81mg  daily thereafter.  Pt will reach out to GI as advised.  [facility] notified to cancel Rx for Protonix.   Pt has no further questions at this time.

## 2020-11-24 ENCOUNTER — Telehealth: Payer: Self-pay

## 2020-11-24 NOTE — Telephone Encounter (Signed)
Requesting verbal orders for PT one time a week for nine weeks.

## 2020-11-24 NOTE — Telephone Encounter (Signed)
Please ok that verbal order  

## 2020-11-25 NOTE — Telephone Encounter (Signed)
Verbal order give

## 2020-11-27 ENCOUNTER — Encounter: Payer: Self-pay | Admitting: Family Medicine

## 2020-11-29 ENCOUNTER — Telehealth: Payer: Self-pay

## 2020-11-29 MED ORDER — AMLODIPINE BESYLATE 5 MG PO TABS: 5.0000 mg | ORAL_TABLET | Freq: Every day | ORAL | 3 refills | Status: DC

## 2020-11-29 NOTE — Telephone Encounter (Signed)
Ms. Alroy Dust, [place] with [facility] is requesting verbal orders for patient to have ST once a week for six weeks for voice and cognitive deficit. Please advise.

## 2020-11-29 NOTE — Telephone Encounter (Signed)
Left VM giving verbal orders

## 2020-11-29 NOTE — Telephone Encounter (Signed)
Will route to PCP 

## 2020-11-29 NOTE — Telephone Encounter (Signed)
Please ok those verbal orders  

## 2020-12-04 ENCOUNTER — Emergency Department
Admission: EM | Admit: 2020-12-04 | Discharge: 2020-12-04 | Disposition: A | Discharge status: Home / Self Care | Payer: Medicare HMO | Attending: Emergency Medicine | Admitting: Emergency Medicine

## 2020-12-04 ENCOUNTER — Other Ambulatory Visit: Payer: Self-pay

## 2020-12-04 DIAGNOSIS — E039 Hypothyroidism, unspecified: Secondary | ICD-10-CM | POA: Insufficient documentation

## 2020-12-04 DIAGNOSIS — I10 Essential (primary) hypertension: Secondary | ICD-10-CM | POA: Diagnosis not present

## 2020-12-04 DIAGNOSIS — Z7982 Long term (current) use of aspirin: Secondary | ICD-10-CM | POA: Insufficient documentation

## 2020-12-04 DIAGNOSIS — Z79899 Other long term (current) drug therapy: Secondary | ICD-10-CM | POA: Diagnosis not present

## 2020-12-04 NOTE — ED Triage Notes (Signed)
Pt to ed pov for htn sen from PCP. States SBP has been 180's, taking prescribed meds. Denies cp, headache.  Ambulatory, alert and oriented

## 2020-12-04 NOTE — ED Notes (Signed)
See triage note. Denies any CP, SOB, Headache, etc.

## 2020-12-04 NOTE — ED Provider Notes (Signed)
[facility] Emergency Department Provider Note   ____________________________________________   Event Date/Time   First MD Initiated Contact with Patient 12/04/20 [phone]     (approximate)  I have reviewed the triage vital signs and the nursing notes.   HISTORY  Chief Complaint Hypertension    HPI Sarah Harper is a 80 y.o. female with a stated past medical history of CVA with persistent memory deficits, hypertension, and IBS who presents for elevated blood pressure.  Patient states that she recently had multiple medication changes and since that time has had nocturnal hypertension that wakes her from sleep with a severe headache.  Patient states that she is currently working with a cardiologist and neurologist to optimize her antihypertensive medication regimen.  Patient states that her current blood pressure is actually "fairly normal" for her however when she woke up this morning it was slightly higher and was told by her neurologist to present to the emergency department with any blood pressure readings of a systolic over 829.  Patient denies any complaints at this time.  Patient currently denies any vision changes, tinnitus, difficulty speaking, facial droop, sore throat, chest pain, shortness of breath, abdominal pain, nausea/vomiting/diarrhea, dysuria, or weakness/numbness/paresthesias in any extremity         Past Medical History:  Diagnosis Date  . DDD (degenerative disc disease)    chronic back pain  . ETD (eustachian tube dysfunction)   . GERD (gastroesophageal reflux disease)   . IBS (irritable bowel syndrome)   . Meniere's disease   . Migraine    still gets visual aura from time to time  . Osteoporosis   . Sleep apnea    CPAP  . Syncopal episodes    after work-up - possible seizures?  . Thyroid disease    hypothyroid    Patient Active Problem List   Diagnosis Date Noted  . Palpitations 11/10/2020  . Migraine   . Acute CVA  (cerebrovascular accident) ([place]) 11/08/2020  . Right knee pain 09/26/2019  . Headache 09/26/2019  . Fall 06/02/2019  . Abrasion of right knee 06/02/2019  . Urinary retention 05/09/2019  . Syncope and collapse 12/12/2018  . Colon cancer screening 02/05/2018  . Degenerative joint disease (DJD) of lumbar spine 05/02/2017  . Internal hemorrhoids 05/02/2017  . Generalized osteoarthritis of hand 12/11/2016  . Stress reaction 12/11/2016  . Estrogen deficiency 05/11/2016  . Encounter for screening mammogram for breast cancer 05/11/2016  . OSA (obstructive sleep apnea) 03/06/2016  . Fatigue 01/25/2016  . Right ankle swelling 10/18/2015  . Left knee pain 10/18/2015  . Snoring 10/18/2015  . Routine general medical examination at a health care facility 02/28/2015  . Encounter for Medicare annual wellness exam 02/06/2013  . Hyperlipidemia 08/27/2012  . Irregular heart beat 08/05/2012  . Gynecological examination 12/20/2010  . BARRETTS ESOPHAGUS 10/01/2010  . Osteopenia 12/17/2009  . BACK PAIN, LUMBAR 07/02/2009  . IRRITABLE BOWEL SYNDROME 04/02/2009  . POSTMENOPAUSAL STATUS 12/10/2008  . Hypothyroidism 12/05/2007  . Meniere's disease 12/05/2007  . GERD 12/05/2007    Past Surgical History:  Procedure Laterality Date  . APPENDECTOMY    . CATARACT EXTRACTION W/PHACO Right 05/21/2019   Procedure: CATARACT EXTRACTION PHACO AND INTRAOCULAR LENS PLACEMENT ([place]) RIGHT panoptix lens  00:35.0  21.7%  7.61;  Surgeon: Leandrew Koyanagi, MD;  Location: [place];  Service: Ophthalmology;  Laterality: Right;  sleep apnea requests early  . CATARACT EXTRACTION W/PHACO Left 06/11/2019   Procedure: CATARACT EXTRACTION PHACO AND INTRAOCULAR LENS  PLACEMENT ([facility]) LEFT PANOPTIX TORIC LENS  00:50.0  20.3%  10.21;  Surgeon: Leandrew Koyanagi, MD;  Location: [place];  Service: Ophthalmology;  Laterality: Left;  sleep apnea requests early  . COLONOSCOPY     last 2012 - [facility]  .  ESOPHAGOGASTRODUODENOSCOPY     Multiple, last 01/07/2017 with Savary dilation to 18 mm  . HEMORRHOID BANDING  2018   [facility]  . LUMBAR DISC SURGERY  1984   L5     Prior to Admission medications   Medication Sig Start Date End Date Taking? Authorizing Provider  ALPRAZolam (XANAX) 0.25 MG tablet Take 1 tablet (0.25 mg total) by mouth 2 (two) times daily as needed for anxiety (with caution of sedation). 03/09/20   Tower, [name], MD  amLODipine (NORVASC) 5 MG tablet Take 1 tablet (5 mg total) by mouth daily. 11/29/20   Tower, [name], MD  aspirin EC 81 MG EC tablet Take 1 tablet (81 mg total) by mouth daily. Swallow whole. 11/10/20   Dwyane Dee, MD  atorvastatin (LIPITOR) 40 MG tablet Take 1 tablet (40 mg total) by mouth at bedtime. 11/09/20   Dwyane Dee, MD  b complex vitamins tablet Take 1 tablet by mouth daily.    [provider]  butalbital-acetaminophen-caffeine (FIORICET) 50-325-40 MG tablet Take 1 tablet by mouth every 6 (six) hours as needed. 03/09/20   Tower, [name], MD  Calcium Carb-Cholecalciferol (CALCIUM 600 + D PO) Take 1 tablet by mouth daily.    [provider]  dexlansoprazole (DEXILANT) 60 MG capsule Take 1 capsule (60 mg total) by mouth daily. 03/09/20   Tower, [name], MD  diclofenac sodium (VOLTAREN) 1 % GEL APPLY 2 GRAMS TOPICALLY FOUR TIMES A DAY TO AFFECTED AREAS 05/24/18   Tower, [name], MD  estradiol (ESTRACE) 0.1 MG/GM vaginal cream Insert small amount (1 cm) in vagina every other day 03/09/20   Tower, [name], MD  levothyroxine (SYNTHROID) 88 MCG tablet Take 1 tablet (88 mcg total) by mouth daily before breakfast. 03/09/20   Tower, [name], MD  linaclotide [facility]) 145 MCG CAPS capsule Take 1 capsule (145 mcg total) by mouth daily. 03/09/20   Tower, [name], MD  metoprolol succinate (TOPROL-XL) 50 MG 24 hr tablet Take 1 tablet (50 mg total) by mouth daily. Take with or immediately following a meal. 11/19/20 05/18/21  Marrianne Mood D, PA-C  metoprolol  tartrate (LOPRESSOR) 25 MG tablet Take 1/2 to 1 tablet daily AS NEEDED for breakthrough palpitations or racing heart rate. Maximum of 1 tablet per day as needed. 11/19/20   Marrianne Mood D, PA-C  mometasone (NASONEX) 50 MCG/ACT nasal spray Place 2 sprays into the nose daily as needed.    [provider]  sucralfate (CARAFATE) 1 g tablet TAKE 1 TABLET (1 G TOTAL) BY MOUTH 4 (FOUR) TIMES DAILY - WITH MEALS AND AT BEDTIME. 03/10/20   Gatha Mayer, MD    Allergies Nsaids and Tolmetin  Family History  Problem Relation Age of Onset  . Diabetes Paternal Aunt   . Breast cancer Paternal Aunt   . Hypertension Maternal Grandmother   . Hypertension Maternal Grandfather     Social History Social History   Tobacco Use  . Smoking status: Never Smoker  . Smokeless tobacco: Never Used  Vaping Use  . Vaping Use: Never used  Substance Use Topics  . Alcohol use: Not Currently    Comment: wine occasional  . Drug use: No    Review  of Systems Constitutional: No fever/chills Eyes: No visual changes. ENT: No sore throat. Cardiovascular: Denies chest pain. Respiratory: Denies shortness of breath. Gastrointestinal: No abdominal pain.  No nausea, no vomiting.  No diarrhea. Genitourinary: Negative for dysuria. Musculoskeletal: Negative for acute arthralgias Skin: Negative for rash. Neurological: Negative for headaches, weakness/numbness/paresthesias in any extremity Psychiatric: Negative for suicidal ideation/homicidal ideation   ____________________________________________   PHYSICAL EXAM:  VITAL SIGNS: ED Triage Vitals  Enc Vitals Group     BP 12/04/20 0744 (!) 176/68     Pulse Rate 12/04/20 0744 63     Resp 12/04/20 0744 18     Temp 12/04/20 0744 98.4 F (36.9 C)     Temp Source 12/04/20 0744 Oral     SpO2 12/04/20 0744 100 %     Weight 12/04/20 0745 134 lb 7.7 oz (61 kg)     Height 12/04/20 0745 5\' 5"  (1.651 m)     Head Circumference --      Peak Flow --      Pain  Score 12/04/20 0745 0     Pain Loc --      Pain Edu? --      Excl. in [place]? --    Constitutional: Alert and oriented. Well appearing and in no acute distress. Eyes: Conjunctivae are normal. PERRL. Head: Atraumatic. Nose: No congestion/rhinnorhea. Mouth/Throat: Mucous membranes are moist. Neck: No stridor Cardiovascular: Grossly normal heart sounds.  Good peripheral circulation. Respiratory: Normal respiratory effort.  No retractions. Gastrointestinal: Soft and nontender. No distention. Musculoskeletal: No obvious deformities Neurologic:  Normal speech and language. No gross focal neurologic deficits are appreciated. Skin:  Skin is warm and dry. No rash noted. Psychiatric: Mood and affect are normal. Speech and behavior are normal.   PROCEDURES  Procedure(s) performed (including Critical Care):  .1-3 Lead EKG Interpretation Performed by: Naaman Plummer, MD Authorized by: Naaman Plummer, MD     Interpretation: normal     ECG rate:  62   ECG rate assessment: normal     Rhythm: sinus rhythm     Ectopy: none     Conduction: normal       ____________________________________________   INITIAL IMPRESSION / ASSESSMENT AND PLAN / ED COURSE  As part of my medical decision making, I reviewed the following data within the [facility] notes reviewed and incorporated, Old chart reviewed, and Notes from prior ED visits reviewed and incorporated        Presents to the emergency department complaining of high blood pressure. Patient is otherwise asymptomatic without confusion, chest pain, hematuria, or SOB.  DDx: CV, AMI, heart failure, renal infarction or failure or other end organ damage.  Disposition: Discussed with patient their elevated blood pressure and need for close outpatient management of their hypertension. Will arrange for the patient to follow up in a primary care clinic       ____________________________________________   FINAL  CLINICAL IMPRESSION(S) / ED DIAGNOSES  Final diagnoses:  Primary hypertension     ED Discharge Orders    None       Note:  This document was prepared using Dragon voice recognition software and may include unintentional dictation errors.   Naaman Plummer, MD 12/04/20 [phone]

## 2020-12-06 DIAGNOSIS — G4733 Obstructive sleep apnea (adult) (pediatric): Secondary | ICD-10-CM

## 2020-12-06 DIAGNOSIS — M545 Low back pain, unspecified: Secondary | ICD-10-CM

## 2020-12-06 DIAGNOSIS — M519 Unspecified thoracic, thoracolumbar and lumbosacral intervertebral disc disorder: Secondary | ICD-10-CM | POA: Diagnosis not present

## 2020-12-06 DIAGNOSIS — Z8673 Personal history of transient ischemic attack (TIA), and cerebral infarction without residual deficits: Secondary | ICD-10-CM

## 2020-12-06 DIAGNOSIS — G8929 Other chronic pain: Secondary | ICD-10-CM

## 2020-12-06 DIAGNOSIS — K219 Gastro-esophageal reflux disease without esophagitis: Secondary | ICD-10-CM | POA: Diagnosis not present

## 2020-12-06 DIAGNOSIS — M810 Age-related osteoporosis without current pathological fracture: Secondary | ICD-10-CM

## 2020-12-06 DIAGNOSIS — G43909 Migraine, unspecified, not intractable, without status migrainosus: Secondary | ICD-10-CM

## 2020-12-06 DIAGNOSIS — E039 Hypothyroidism, unspecified: Secondary | ICD-10-CM | POA: Diagnosis not present

## 2020-12-06 DIAGNOSIS — Z9181 History of falling: Secondary | ICD-10-CM

## 2020-12-06 DIAGNOSIS — Z7902 Long term (current) use of antithrombotics/antiplatelets: Secondary | ICD-10-CM

## 2020-12-06 DIAGNOSIS — K589 Irritable bowel syndrome without diarrhea: Secondary | ICD-10-CM

## 2020-12-06 DIAGNOSIS — I10 Essential (primary) hypertension: Secondary | ICD-10-CM | POA: Diagnosis not present

## 2020-12-06 DIAGNOSIS — H8109 Meniere's disease, unspecified ear: Secondary | ICD-10-CM

## 2020-12-06 DIAGNOSIS — F419 Anxiety disorder, unspecified: Secondary | ICD-10-CM

## 2020-12-08 ENCOUNTER — Other Ambulatory Visit: Payer: Self-pay

## 2020-12-08 ENCOUNTER — Encounter: Payer: Self-pay | Admitting: Physician Assistant

## 2020-12-08 ENCOUNTER — Ambulatory Visit: Payer: Medicare HMO | Admitting: Physician Assistant

## 2020-12-08 VITALS — BP 130/72 | HR 61 | Ht 65.0 in | Wt 134.0 lb

## 2020-12-08 DIAGNOSIS — I479 Paroxysmal tachycardia, unspecified: Secondary | ICD-10-CM

## 2020-12-08 DIAGNOSIS — Z8673 Personal history of transient ischemic attack (TIA), and cerebral infarction without residual deficits: Secondary | ICD-10-CM

## 2020-12-08 DIAGNOSIS — I491 Atrial premature depolarization: Secondary | ICD-10-CM

## 2020-12-08 DIAGNOSIS — R002 Palpitations: Secondary | ICD-10-CM | POA: Diagnosis not present

## 2020-12-08 DIAGNOSIS — I10 Essential (primary) hypertension: Secondary | ICD-10-CM | POA: Diagnosis not present

## 2020-12-08 DIAGNOSIS — E785 Hyperlipidemia, unspecified: Secondary | ICD-10-CM

## 2020-12-08 DIAGNOSIS — G4733 Obstructive sleep apnea (adult) (pediatric): Secondary | ICD-10-CM

## 2020-12-08 DIAGNOSIS — I639 Cerebral infarction, unspecified: Secondary | ICD-10-CM | POA: Diagnosis not present

## 2020-12-08 DIAGNOSIS — I493 Ventricular premature depolarization: Secondary | ICD-10-CM

## 2020-12-08 DIAGNOSIS — I471 Supraventricular tachycardia, unspecified: Secondary | ICD-10-CM

## 2020-12-08 DIAGNOSIS — R9431 Abnormal electrocardiogram [ECG] [EKG]: Secondary | ICD-10-CM | POA: Diagnosis not present

## 2020-12-08 DIAGNOSIS — Z01812 Encounter for preprocedural laboratory examination: Secondary | ICD-10-CM

## 2020-12-08 DIAGNOSIS — R5383 Other fatigue: Secondary | ICD-10-CM

## 2020-12-08 MED ORDER — METOPROLOL TARTRATE 50 MG PO TABS: 50.0000 mg | ORAL_TABLET | Freq: Once | ORAL | 0 refills | Status: DC

## 2020-12-08 NOTE — Progress Notes (Signed)
Office Visit    Patient Name: Sarah Harper Date of Encounter: 12/08/2020  PCP:  Abner Greenspan, MD   [place]  Cardiologist:  Kate Sable, MD  Advanced Practice Provider:  No care team member to display Electrophysiologist:  None  :[id number]}   Chief Complaint    Chief Complaint  Patient presents with  . Other    2 week follow up. Meds reviewed verbally with patient.     80 year old female with history of hypertension, hyperlipidemia, hypothyroidism, OSA on CPAP, GERD, anxiety, migraine headaches, IBS, history of stroke, tachypalpitations, and who is seen today for follow-up after recent ED trip for elevated BP.    Past Medical History    Past Medical History:  Diagnosis Date  . DDD (degenerative disc disease)    chronic back pain  . ETD (eustachian tube dysfunction)   . GERD (gastroesophageal reflux disease)   . IBS (irritable bowel syndrome)   . Meniere's disease   . Migraine    still gets visual aura from time to time  . Osteoporosis   . Sleep apnea    CPAP  . Syncopal episodes    after work-up - possible seizures?  . Thyroid disease    hypothyroid   Past Surgical History:  Procedure Laterality Date  . APPENDECTOMY    . CATARACT EXTRACTION W/PHACO Right 05/21/2019   Procedure: CATARACT EXTRACTION PHACO AND INTRAOCULAR LENS PLACEMENT ([place]) RIGHT panoptix lens  00:35.0  21.7%  7.61;  Surgeon: Leandrew Koyanagi, MD;  Location: [facility];  Service: Ophthalmology;  Laterality: Right;  sleep apnea requests early  . CATARACT EXTRACTION W/PHACO Left 06/11/2019   Procedure: CATARACT EXTRACTION PHACO AND INTRAOCULAR LENS PLACEMENT ([facility]) LEFT PANOPTIX TORIC LENS  00:50.0  20.3%  10.21;  Surgeon: Leandrew Koyanagi, MD;  Location: [place];  Service: Ophthalmology;  Laterality: Left;  sleep apnea requests early  . COLONOSCOPY     last 2012 - [facility]  . ESOPHAGOGASTRODUODENOSCOPY     Multiple, last 01/07/2017  with Savary dilation to 18 mm  . HEMORRHOID BANDING  2018   [facility]  . LUMBAR DISC SURGERY  1984   L5     Allergies  Allergies  Allergen Reactions  . Nsaids     GI discomfort and gastritis  . Tolmetin Other (See Comments)    GI discomfort and gastritis    History of Present Illness    Sarah Harper is a 80 y.o. female with PMH as above.  She notes family history of a nephew that had his heart valve replaced in his 4s and deceased from a pulmonary embolism.  She has history of tachypalpitations on review of previous [facility] documentation.  She underwent 2014 monitor, which showed NSR with frequent PACs and occasional PVCs.  10/2020 echocardiogram with bubble study as below showed normal LVEF, read by an outside cardiologist.  Previous carotid study as below with less than 40% narrowing.  Most recent CTA of head and neck showed similar plaque findings at the right greater than left ICA origins with less than 50% stenosis.  No proximal intracranial vessel occlusion was noted.  She was last seen by her primary cardiologist 03/22/2020.  At that time, she reported lower extremity edema and symptoms consistent with venous insufficiency.  Lower extremity venous ultrasound showed reflux.  At RTC, she reported prednisone for right ankle swelling.  Compression stockings were helping with her symptoms.  She was most recently seen in at [facility] hospital  3/21-3/23 and 3/23-3/24 or CVA and strokelike symptoms.  As above, 3/21 CTA of head and neck was performed and without any evidence of occlusion.  3/21 MRI brain revealed left frontal lobe punctate acute infarct and small vessel disease.  She was evaluated by neurology with recommendation for DAPT for 21 days, followed by ASA monotherapy and lifelong statin..  Event monitor was recommended.  Echo performed showed normal LVEF and no intracardiac shunt.  She was discharged on DAPT with ASA and Plavix.  While in the hospital, she developed significant anxiety  with BP elevated.  BP was also noted to be labile on review of hospital pressures.  She did not tolerate hydralazine.  She reported stress concerning her husband.  Metoprolol was added to her medication regimen.  At discharge, blood pressure 193/97 with HR 110 bpm.  Also noted was discharge potassium of 3.6.  Of note, cardiology was not consulted during either admission.  Seen 11/19/20 and not feeling well with reduced appetite and wt loss. She reported that, despite CPAP compliance, she frequently woke at 4AM with heart racing. She also noted racing HR at other times. She occasionally enjoyed potato chips. She reported nausea and burning up to her throat. She was taking dexlansoprazole. Recommendation was for Toprol 50mg  daily with 0.5 to 1 tablet lopressor 25mg  for racing HR. She was continued on her PPI. AT RTC, CMET and LDL recommended.   On 11/29/20, she sent a MyChart message of elevate BP while asleep. She reported SBP 160s and trouble thinking clearly.  On 12/04/20, she was seen in the ED for elevated BP. She was reportedly then told by her neurologist to present to the ED. BP in ED 176/68. She was discharged home with recommendation for PCP follow-up.  Today, 12/08/2020, she returns to clinic and notes a recent trip to the emergency department for elevated BP.  She reports Dr Brigitte Pulse gave her Lexapro and every night she woke with hot blood feeling in her face.  She called her neurologist with recommendation that she stop Lexapro.  She was advised to go to the emergency department if SBP above 160.  She then discontinued Lexapro last Thursday.  She reports that, on 4/16, she presented to the emergency department after she again awoke from sleep with elevated BP and SBP elevated above 160.  Today, she denies any chest pain or shortness of breath.  She denies any racing heart rate or palpitations during her waking hours today.  No presyncope or syncope.  No recent falls.  No signs or symptoms consistent with  bleeding.  No signs or symptoms of volume overload.  She reports that she continues to wake twice per night with elevated blood pressure.  When waking in the middle of the night, associated symptoms include facial flushing and tachypalpitations.  She often feels as if she is waking from dream, but is unable to remember the dream.  She reports daytime fatigue, attributed to waking at least twice per night.  She is using her CPAP consistently.  She is watching both salt and fluid intake.  She reports a total of 6 chips since her last visit.  She denies any alcohol use.  She reports caffeine is now only consumed as a rare treat.  She remains well-hydrated.   On further discussion, it is discovered that she has not started the Toprol 50 mg daily.  Instead, she has been taking Lopressor 25 mg twice daily with an additional extra 1 to 2 tablets of Lopressor  25 mg as needed for breakthrough tachypalpitations.  On review of her log, she is taking between 3-4 Lopressor 25 mg tablets per day.  She reports that she sometimes feels fatigued if taking Lopressor 25 mg back to back in close proximity to one another, and as seen on her BP/heart rate/medication log.  Recommendations regarding medications, based on this description, as outlined below.  Of note, she continues to defer loop/versus TEE today but agreeable to MPI versus CTA as outlined below.  She reports monitoring her BP with a left wrist cuff.  She also owns a brachial cuff; however, she does report it is difficult for her to get the Velcro and cuff aligned properly on her own.  She reports that, by the time she is able to get the cuff on, her blood pressure is elevated just from trying to get it set correctly.  As below, encouraged to use brachial cuff if possible, as this gives more accurate readings.  She reports an upcoming trip with her husband to the beach.  Home Medications    Current Outpatient Medications on File Prior to Visit  Medication Sig  Dispense Refill  . ALPRAZolam (XANAX) 0.25 MG tablet Take 1 tablet (0.25 mg total) by mouth 2 (two) times daily as needed for anxiety (with caution of sedation). 30 tablet 0  . amLODipine (NORVASC) 5 MG tablet Take 1 tablet (5 mg total) by mouth daily. 90 tablet 3  . aspirin EC 81 MG EC tablet Take 1 tablet (81 mg total) by mouth daily. Swallow whole. 30 tablet 11  . atorvastatin (LIPITOR) 40 MG tablet Take 1 tablet (40 mg total) by mouth at bedtime. 90 tablet 3  . b complex vitamins tablet Take 1 tablet by mouth daily.    . butalbital-acetaminophen-caffeine (FIORICET) 50-325-40 MG tablet Take 1 tablet by mouth every 6 (six) hours as needed. 60 tablet 0  . Calcium Carb-Cholecalciferol (CALCIUM 600 + D PO) Take 1 tablet by mouth daily.    Marland Kitchen dexlansoprazole (DEXILANT) 60 MG capsule Take 1 capsule (60 mg total) by mouth daily. 90 capsule 3  . diclofenac sodium (VOLTAREN) 1 % GEL APPLY 2 GRAMS TOPICALLY FOUR TIMES A DAY TO AFFECTED AREAS 300 g 3  . estradiol (ESTRACE) 0.1 MG/GM vaginal cream Insert small amount (1 cm) in vagina every other day 42.5 g 3  . levothyroxine (SYNTHROID) 88 MCG tablet Take 1 tablet (88 mcg total) by mouth daily before breakfast. 90 tablet 3  . linaclotide (LINZESS) 145 MCG CAPS capsule Take 1 capsule (145 mcg total) by mouth daily. 90 capsule 3  . metoprolol tartrate (LOPRESSOR) 25 MG tablet Patient takes 2 tablets (50MG ) daily and 1/2 to 1 tablet daily AS NEEDED for breakthrough palpitations or racing heart rate. Maximum of 1 tablet per day as needed.    . mometasone (NASONEX) 50 MCG/ACT nasal spray Place 2 sprays into the nose daily as needed.    . sucralfate (CARAFATE) 1 g tablet TAKE 1 TABLET (1 G TOTAL) BY MOUTH 4 (FOUR) TIMES DAILY - WITH MEALS AND AT BEDTIME. 120 tablet 2   No current facility-administered medications on file prior to visit.    Review of Systems    She denies chest pain, dyspnea, pnd, orthopnea, v, dizziness, syncope, edema, weight gain.  She  reports resolution of previous edema.  She reports reduced appetite and wt loss, racing heart rate that wakes her from sleep at 4 AM and now twice per night. She is fatigued, attributed to  not sleeping through the night. All other systems reviewed and are otherwise negative except as noted above.  Physical Exam   VS:  BP 130/72 (BP Location: Left Arm, Patient Position: Sitting, Cuff Size: Normal)   Pulse 61   Ht 5\' 5"  (1.651 m)   Wt 134 lb (60.8 kg)   BMI 22.30 kg/m  , BMI Body mass index is 22.3 kg/m. GEN: Thin and elderly female, in no acute distress.  Mask in place.  Joined by her husband. HEENT: normal. Neck: Supple, no JVD, carotid bruits, or masses. Cardiac:RRR, 2/6 systolic murmur, rubs, or gallops. No clubbing, cyanosis. Mild bilateral nonpitting edema.  Radials/DP/PT 2+ and equal bilaterally.  Respiratory:  Respirations regular and unlabored, clear to auscultation bilaterally. GI: Soft, nontender, nondistended, BS + x 4. MS: no deformity or atrophy. Skin: warm and dry, no rash. Neuro:  Strength and sensation are intact. Psych: Normal affect.  Accessory Clinical Findings    ECG personally reviewed by me today - NSR, 61bpm, prolonged PRi at 126ms, QRS 94 ms, previously noted T wave inversion resolved but still present in aVL, poor R wave in inferior leads, - no acute changes.  VITALS Reviewed today   Temp Readings from Last 3 Encounters:  12/04/20 98.4 F (36.9 C) (Oral)  11/17/20 (!) 97.5 F (36.4 C) (Temporal)  11/11/20 98.6 F (37 C) (Oral)   BP Readings from Last 3 Encounters:  12/08/20 130/72  12/04/20 (!) 176/68  11/19/20 122/80   Pulse Readings from Last 3 Encounters:  12/08/20 61  12/04/20 63  11/19/20 (!) 58    Wt Readings from Last 3 Encounters:  12/08/20 134 lb (60.8 kg)  12/04/20 134 lb 7.7 oz (61 kg)  11/19/20 135 lb (61.2 kg)     LABS  reviewed today   Lab Results  Component Value Date   WBC 6.7 11/11/2020   HGB 14.4 11/11/2020   HCT 42.6  11/11/2020   MCV 87.3 11/11/2020   PLT 259 11/11/2020   Lab Results  Component Value Date   CREATININE 0.64 11/11/2020   BUN 13 11/11/2020   NA 134 (L) 11/11/2020   K 3.6 11/11/2020   CL 102 11/11/2020   CO2 22 11/11/2020   Lab Results  Component Value Date   ALT 20 11/10/2020   AST 27 11/10/2020   ALKPHOS 82 11/10/2020   BILITOT 1.0 11/10/2020   Lab Results  Component Value Date   CHOL 219 (H) 11/09/2020   HDL 97 11/09/2020   LDLCALC 109 (H) 11/09/2020   LDLDIRECT 115.5 02/12/2013   TRIG 67 11/09/2020   CHOLHDL 2.3 11/09/2020    Lab Results  Component Value Date   HGBA1C 5.7 (H) 11/09/2020   Lab Results  Component Value Date   TSH 3.695 11/10/2020     STUDIES/PROCEDURES reviewed today   Echocardiogram 11/09/2020 1. Left ventricular ejection fraction, by estimation, is 55 to 60%. The  left ventricle has normal function. The left ventricle has no regional  wall motion abnormalities. Left ventricular diastolic parameters were  normal.  2. Right ventricular systolic function is normal. The right ventricular  size is normal.  3. Left atrial size was mildly dilated.  4. The mitral valve is grossly normal. Mild mitral valve regurgitation.  5. The aortic valve is tricuspid. Aortic valve regurgitation is trivial.   Carotid study 02/06/2015 Notes Recorded by Leonie Man, MD on 02/06/2015 at 8:05 PM Normal Carotid [place] - < 40% narrowing.  2014 Transcribed via dictation from uploaded  scan: 48-hour cardiac monitoring in 2014 showed NSR with frequent PACs and rare PVCs.  On review of EMR scan, heart rate varied from 114 bpm to 62 bpm.  Pauses were noted at 17 the longest of which was 29.1 seconds and given no attached data, suspect this is possibly an error (? 2.91 seconds likely).  SVT runs equal 48 the longest of which is 5 beats and at a maximum rate of 192 bpm.  Ventricular couplets were present  Assessment & Plan   Racing heart rate /  tachypalpitations --Reports waking up in the middle of the night with elevated BP and racing heart rate, twice nightly.   Previous 2014 cardiac monitoring as above. Given sinus pauses and pSVT with recent bradycardia on EKG and recent CVA, discussed recommendation for Zio versus loop, both deferred per pt at this time. Continue to reassess with preference for Loop over Zio as noted today. Given CVA, TEE also recommended though declined for now. As in HPI, she continued taking the short acting lopressor after our last visit. Again reviewed plan for Toprol 50mg  daily to give her more even coverage of HR/BP. To help induce fatigue, recommend additional lopressor 25mg  before bed.  Then, if waking in the middle of night, and if HR above 60 and SBP above 100, take additional 1 tablet Lopressor 25 mg with close monitoring of first degree AVB and HR at RTC.  She reports fatigue is often induced by Lopressor 25 mg tablets, especially if taken in close proximity to one another.  Encouraged pt to send a MyChart message tomorrow to update me on how this works for her before her upcoming beach vacation.  Ongoing monitoring of fluid and salt intake, as well as low caffeine intake recommended.  Labile blood pressure --BP today more controlled in clinic than on home log. On review of EMR, SBP remains elevated above 130s with rare low SBP 100s and patient asymptomatic.  She reports using her wrist cuff more often than that of her brachial as above with recommendation for brachial cuff over that of wrist cuff if possible, given this will give her more accurate readings.  Diet reviewed, including recommendation for fluids under 2 L and salt under 2 g/day.  Recommend Toprol 50 mg daily today with Lopressor 25 mg before bed and 1 PRN lopressor 25 mg if waking during the night with hold precautions as outlined above.  Given her Barrett's esophagus / pill esophagitis issues as outlined above, future considerations could include  addition of Imdur.  Abnormal EKG --No chest pain or shortness of breath.  She does report racing heart rate and labile blood pressure.  T wave inversion noted in the lateral leads of previous EKGs, resolved on today's tracings.  Discussed coronary CT versus stress testing with patient agreeable for coronary CTA at this time. Risk factors for coronary insufficiency include age, hypertension, hyperlipidemia. Aggressive risk factor modification recommended. Continue ASA (discontinued Plavix 4/14), BB, statin. Consider addition of Imdur in the future as above.   History of CVA --On review of EMR, it appears as if her first 3/21 admission showed punctate acute infarct involving the high left frontal lobe.  She was evaluated by neurology during her admission but cardiology not consulted.  TEE ordered but cancelled. CTA without LVO or significant carotid stenosis.  Loop and Zio cardiac monitoring discussed with pt preference to still defer for now and reassess at RTC. Also reassess at RTC- TEE if Barrett's esophagus allows.  Echo during her  admission showed normal LVEF, no PFO, no WMA.  Agree with ongoing neurology follow-up.  Continue ASA and statin.  (DAPT discontinued last week) Recommend risk factor modification with BP, heart rate, and cholesterol control.  Continue current beta-blocker with close monitoring of first-degree AV block and heart rate.  Hyperlipidemia, LDL goal <70 -11/09/2020 total cholesterol 219 with LDL 109 and not at goal of below 70. Repeat lipid and liver today with adjustment to statin if needed.  If LDL still not at goal, increase of atorvastatin to 80 mg versus addition of Zetia recommended at that time.  Barrett's esophagus/GERD --Reports previous reflux symptoms associated with taking ASA 81 mg and Plavix 75 mg daily.  Now only on ASA daily. She denies any signs or symptoms of bleeding. Continue current PPI and GI medications as prescribed by PCP/GI.  As previously noted, future  considerations could include addition of Imdur.  H/o first-degree AV block/bradycardia -Asymptomatic with previous bradycardia/first-degree AVB.  As above, recommend monitor EKG closely on current beta-blocker therapy.  Cose monitoring of BP, heart rate, and EKG.  Lower extremity edema -- History of LEE, worsened throughout the day.  When last seen in office, varicose veins were noted on exams and symptoms attributed to venous insufficiency.  Continue to recommend elevation, compression stockings, and Ace wraps as needed.  Sleep apnea on CPAP --Continue CPAP.  Discussed the importance of ongoing CPAP use, as untreated sleep apnea is associated with cardiovascular findings including atrial fibrillation.  History of previous elevated hemoglobin A1c --Recheck per PCP.  Glycemic control recommended from cardiovascular standpoint.  Vascular disease -Follow up with [facility] as per scheduled. Continue to monitor carotids / subclavian / LE.  Continue ASA and statin.   Medication changes: Toprol 50mg  with lopressor  25mg  before bed and as needed lopressor 25 mg x 1 if breakthrough tachypalpitations wake her from sleep.pending MyChart message with update on symptoms, will consolidate medications +/-add additional antihypertensives if needed Labs ordered: CMET, CBC, lipids Studies / Imaging ordered: cCTA Future considerations: Loop and EP referral /TEE. Consider Imdur. BB changes if needed based on repeat EKG. Statin / Zetia changes based on LDL/ALT. Losartan versus amlodipine if further BP support needed. EKG at RTC: Yes Disposition: RTC 1st week of May    Amsi Grimley D Talayla Doyel, Hershal Coria 12/08/2020

## 2020-12-08 NOTE — Patient Instructions (Addendum)
Medication Instructions:   Take Toprol (metoprolol succinate) 50mg  once daily     metoprolol tartrate 25 mg (right before bed)    each and every day for a total of 75mg  plus one optional 25mg     metoprolol tartrate if you have any breakthrough tachycardia that wakes you up from your sleep at night  Send a MyChart message with HR and blood pressure readings tomorrow.  *If you need a refill on your cardiac medications before your next appointment, please call your pharmacy*   Lab Work: CMET, CBC, Lipids  COVID PRE- TEST: You will need a COVID TEST prior to the procedure: 2 days beofre  LOCATION: [facility].  M-F 8am-2pm    Testing/Procedures: Cardiac CTA  [facility] [address] [place], [place] [address] [phone]  Please arrive 15 mins early for check-in and test prep.  Please follow these instructions carefully (unless otherwise directed):  On the Night Before the Test: . Be sure to Drink plenty of water. . Do not consume any caffeinated/decaffeinated beverages or chocolate 12 hours prior to your test. . Do not take any antihistamines 12 hours prior to your test. - Example: Benadryl   On the Day of the Test: . Drink plenty of water until 1 hour prior to the test. . Do not eat any food 4 hours prior to the test. . You may take your regular medications prior to the test.  . Take metoprolol (Lopressor) two hours prior to test. . Lopressor 50 mg . HOLD Furosemide/Hydrochlorothiazide morning of the test. . FEMALES- please wear underwire-free bra if available  After the Test: . Drink plenty of water. . After receiving IV contrast, you may experience a mild flushed feeling. This is normal. . On occasion, you may experience a mild rash up to 24 hours after the test. This is not dangerous. If this occurs, you can take Benadryl 25 mg and increase your fluid intake. . If you experience trouble  breathing, this can be serious. If it is severe call 911 IMMEDIATELY. If it is mild, please call our office. . If you take any of these medications: Glipizide/Metformin, Avandament, Glucavance, please do not take 48 hours after completing test unless otherwise instructed.   Once we have confirmed authorization from your insurance company, we will call you to set up a date and time for your test. Based on how quickly your insurance processes prior authorizations requests, please allow up to 4 weeks to be contacted for scheduling your Cardiac CT appointment. Be advised that routine Cardiac CT appointments could be scheduled as many as 8 weeks after your provider has ordered it.  For scheduling needs, including cancellations and rescheduling, please call [place], [phone].    Follow-Up: Your next appointment:   1st week of May  The format for your next appointment:   In Person  Provider:   Marrianne Mood, PA-C

## 2020-12-09 LAB — CBC
Hematocrit: 39.2 % (ref 34.0–46.6)
Hemoglobin: 13.2 g/dL (ref 11.1–15.9)
MCH: 29.5 pg (ref 26.6–33.0)
MCHC: 33.7 g/dL (ref 31.5–35.7)
MCV: 88 fL (ref 79–97)
Platelets: 269 10*3/uL (ref 150–450)
RBC: 4.47 x10E6/uL (ref 3.77–5.28)
RDW: 12.7 % (ref 11.7–15.4)
WBC: 7.1 10*3/uL (ref 3.4–10.8)

## 2020-12-09 LAB — COMPREHENSIVE METABOLIC PANEL
ALT: 31 IU/L (ref 0–32)
AST: 22 IU/L (ref 0–40)
Albumin/Globulin Ratio: 2.7 — ABNORMAL HIGH (ref 1.2–2.2)
Albumin: 4.6 g/dL (ref 3.7–4.7)
Alkaline Phosphatase: 92 IU/L (ref 44–121)
BUN/Creatinine Ratio: 16 (ref 12–28)
BUN: 12 mg/dL (ref 8–27)
Bilirubin Total: 0.4 mg/dL (ref 0.0–1.2)
CO2: 24 mmol/L (ref 20–29)
Calcium: 9.3 mg/dL (ref 8.7–10.3)
Chloride: 95 mmol/L — ABNORMAL LOW (ref 96–106)
Creatinine, Ser: 0.74 mg/dL (ref 0.57–1.00)
Globulin, Total: 1.7 g/dL (ref 1.5–4.5)
Glucose: 98 mg/dL (ref 65–99)
Potassium: 3.6 mmol/L (ref 3.5–5.2)
Sodium: 134 mmol/L (ref 134–144)
Total Protein: 6.3 g/dL (ref 6.0–8.5)
eGFR: 82 mL/min/{1.73_m2} (ref >59)

## 2020-12-09 LAB — LIPID PANEL
Chol/HDL Ratio: 1.6 ratio (ref 0.0–4.4)
Cholesterol, Total: 119 mg/dL (ref 100–199)
HDL: 74 mg/dL (ref >39)
LDL Chol Calc (NIH): 33 mg/dL (ref 0–99)
Triglycerides: 50 mg/dL (ref 0–149)
VLDL Cholesterol Cal: 12 mg/dL (ref 5–40)

## 2020-12-10 ENCOUNTER — Other Ambulatory Visit: Payer: Self-pay | Admitting: Physician Assistant

## 2020-12-10 DIAGNOSIS — I471 Supraventricular tachycardia: Secondary | ICD-10-CM

## 2020-12-10 DIAGNOSIS — I10 Essential (primary) hypertension: Secondary | ICD-10-CM

## 2020-12-10 MED ORDER — METOPROLOL SUCCINATE ER 50 MG PO TB24: 50.0000 mg | ORAL_TABLET | Freq: Every day | ORAL | 11 refills | Status: DC

## 2020-12-10 MED ORDER — AMLODIPINE BESYLATE 10 MG PO TABS: 10.0000 mg | ORAL_TABLET | Freq: Every day | ORAL | 2 refills | Status: DC

## 2020-12-10 MED ORDER — SPIRONOLACTONE 25 MG PO TABS: 25.0000 mg | ORAL_TABLET | Freq: Every day | ORAL | 2 refills | Status: DC

## 2020-12-10 NOTE — Telephone Encounter (Signed)
Called and spoke to pt this morning to review details of MyChart message from [place] and clarify medication regimen.  Pt verbalized understanding of plan: (1) TOPROL 50MG  x1 each morning (2) SPIRONOLACTONE 25MG  x1 each morning (3) AMLODIPINE 5MG  x2 ( 10MG  TOTAL ) = immediately before bed.  She understands to remove the Metoprolol Tartrate bottle so she does not accidentally mix with medications.  Pt does state she is going out of town to [place] this weekend, so I did recommend that she bring the short acting metoprolol with her in the event that she has to call in over the weekend and possibility of regimen changing. Pt confirms she will keep separate and not take short acting unless advised.  Advised pt continue to monitor BP/HR 1-2 hours after BP meds and to call us Monday with update on how she tolerated regimen with readings.  Also advised pt to contact our office w/ any s/s dizziness, lightheadedness, continued breakthrough tachycardia/palpitations or any changes or concerns over the weekend.  Medlist correct, only metoprolol tartrate on list is for cardiac CT one time dose.  Pt will pick up new Rx today and will begin new regimen today.  Pt has no further questions at this time and appreciative of our help.

## 2020-12-13 ENCOUNTER — Telehealth: Payer: Self-pay | Admitting: *Deleted

## 2020-12-13 DIAGNOSIS — I10 Essential (primary) hypertension: Secondary | ICD-10-CM

## 2020-12-13 DIAGNOSIS — Z79899 Other long term (current) drug therapy: Secondary | ICD-10-CM

## 2020-12-13 DIAGNOSIS — I471 Supraventricular tachycardia: Secondary | ICD-10-CM

## 2020-12-13 NOTE — Telephone Encounter (Signed)
-----   Message from Arvil Chaco, PA-C sent at 12/10/2020  3:06 PM EDT ----- Discussed with pt via MyChart --Kidney function stable. --Potassium low. --Start spironolactone 25mg  daily.  Repeat BMET in 1-2 weeks.

## 2020-12-13 NOTE — Telephone Encounter (Signed)
Spoke to pt. Notified of lab results and provider's recc.  Pt verbalized understanding.  Pt did start Spironolactone 25mg  daily on 12/10/20. (MyChart msg 4/20) Will repeat BMET next week at [facility] 12/23/20. Appt notes updated.   Pt does also give update that since changing to Toprol 50mg  daily, she has only had one breakthrough episode of palpitations. This was on 4/23 during the night. At time of episode, BP 167/74 HR 77.  Otherwise, pt feels her episodes have decr significantly and reports BP running 140s/60s and HR 60s-mid 70s.  Notified pt I will make Jacquelyn aware. Otherwise, advised she let us know of any recurrent breakthrough palpitations/tachycardia prior to ov.  Pt has no further questions at this time.

## 2020-12-13 NOTE — Telephone Encounter (Signed)
Pt called back this morning to add to readings given in MyChart.  States BP this morning after breakfast at 10AM 114/60. Rechecked at 128/64 HR 70.  Advised pt to continue current regimen, and to hold Toprol if SBP <100 and/or HR <60.  Also asked pt to let us know of any s/s dizziness, lightheadedness, etc.  Pt verbalized understanding.

## 2020-12-15 ENCOUNTER — Other Ambulatory Visit: Payer: Self-pay | Admitting: Internal Medicine

## 2020-12-21 ENCOUNTER — Telehealth: Payer: Self-pay

## 2020-12-21 NOTE — Telephone Encounter (Signed)
Left VM given the verbal order

## 2020-12-21 NOTE — Telephone Encounter (Signed)
Anna Genre, ST called requesting verbal orders to continue speech therapy for once a week for 3 weeks for vocal deficits. Ok to LVM. Please advise.

## 2020-12-21 NOTE — Telephone Encounter (Signed)
Please ok that verbal order  

## 2020-12-22 ENCOUNTER — Telehealth (HOSPITAL_COMMUNITY): Payer: Self-pay | Admitting: Emergency Medicine

## 2020-12-22 NOTE — Telephone Encounter (Signed)
Reaching out to patient to offer assistance regarding upcoming cardiac imaging study; pt verbalizes understanding of appt date/time, parking situation and where to check in, pre-test NPO status and medications ordered, and verified current allergies; name and call back number provided for further questions should they arise Marchia Bond RN Navigator Cardiac Imaging [facility] [phone] office [phone] cell   Holding daily metop suc and taking 50mg  metoprolol tartrate 2 hr prior to scan Clarise Cruz

## 2020-12-23 ENCOUNTER — Ambulatory Visit
Admission: RE | Admit: 2020-12-23 | Discharge: 2020-12-23 | Disposition: A | Discharge status: Home / Self Care | Payer: Medicare HMO | Source: Ambulatory Visit | Attending: Physician Assistant | Admitting: Physician Assistant

## 2020-12-23 ENCOUNTER — Encounter: Payer: Self-pay | Admitting: Physician Assistant

## 2020-12-23 ENCOUNTER — Ambulatory Visit: Payer: Medicare HMO | Admitting: Physician Assistant

## 2020-12-23 ENCOUNTER — Other Ambulatory Visit: Payer: Self-pay

## 2020-12-23 VITALS — BP 110/68 | HR 57 | Ht 65.0 in | Wt 135.0 lb

## 2020-12-23 DIAGNOSIS — R002 Palpitations: Secondary | ICD-10-CM | POA: Diagnosis not present

## 2020-12-23 DIAGNOSIS — K219 Gastro-esophageal reflux disease without esophagitis: Secondary | ICD-10-CM

## 2020-12-23 DIAGNOSIS — Z8673 Personal history of transient ischemic attack (TIA), and cerebral infarction without residual deficits: Secondary | ICD-10-CM | POA: Diagnosis not present

## 2020-12-23 DIAGNOSIS — I872 Venous insufficiency (chronic) (peripheral): Secondary | ICD-10-CM

## 2020-12-23 DIAGNOSIS — R001 Bradycardia, unspecified: Secondary | ICD-10-CM

## 2020-12-23 DIAGNOSIS — I10 Essential (primary) hypertension: Secondary | ICD-10-CM | POA: Insufficient documentation

## 2020-12-23 DIAGNOSIS — E785 Hyperlipidemia, unspecified: Secondary | ICD-10-CM | POA: Diagnosis present

## 2020-12-23 DIAGNOSIS — I471 Supraventricular tachycardia: Secondary | ICD-10-CM | POA: Diagnosis not present

## 2020-12-23 DIAGNOSIS — I440 Atrioventricular block, first degree: Secondary | ICD-10-CM

## 2020-12-23 DIAGNOSIS — I493 Ventricular premature depolarization: Secondary | ICD-10-CM | POA: Diagnosis not present

## 2020-12-23 DIAGNOSIS — R9431 Abnormal electrocardiogram [ECG] [EKG]: Secondary | ICD-10-CM | POA: Diagnosis not present

## 2020-12-23 DIAGNOSIS — G4733 Obstructive sleep apnea (adult) (pediatric): Secondary | ICD-10-CM

## 2020-12-23 DIAGNOSIS — R5383 Other fatigue: Secondary | ICD-10-CM

## 2020-12-23 DIAGNOSIS — K22719 Barrett's esophagus with dysplasia, unspecified: Secondary | ICD-10-CM

## 2020-12-23 MED ORDER — NITROGLYCERIN 0.4 MG SL SUBL
0.8000 mg | SUBLINGUAL_TABLET | Freq: Once | SUBLINGUAL | Status: AC
  Administered 2020-12-23: 0.8 mg via SUBLINGUAL

## 2020-12-23 MED ORDER — IOHEXOL 350 MG/ML SOLN
75.0000 mL | Freq: Once | INTRAVENOUS | Status: AC | PRN
  Administered 2020-12-23: 75 mL via INTRAVENOUS

## 2020-12-23 NOTE — Patient Instructions (Signed)
Medication Instructions:  - Your physician recommends that you continue on your current medications as directed. Please refer to the Current Medication list given to you today.  *If you need a refill on your cardiac medications before your next appointment, please call your pharmacy*   Lab Work: - none ordered If you have labs (blood work) drawn today and your tests are completely normal, you will receive your results only by: Marland Kitchen MyChart Message (if you have MyChart) OR . A paper copy in the mail If you have any lab test that is abnormal or we need to change your treatment, we will call you to review the results.   Testing/Procedures: - none ordered   Follow-Up: At [facility], you and your health needs are our priority.  As part of our continuing mission to provide you with exceptional heart care, we have created designated Provider Care Teams.  These Care Teams include your primary Cardiologist (physician) and Advanced Practice Providers (APPs -  Physician Assistants and Nurse Practitioners) who all work together to provide you with the care you need, when you need it.  We recommend signing up for the patient portal called "MyChart".  Sign up information is provided on this After Visit Summary.  MyChart is used to connect with patients for Virtual Visits (Telemedicine).  Patients are able to view lab/test results, encounter notes, upcoming appointments, etc.  Non-urgent messages can be sent to your provider as well.   To learn more about what you can do with MyChart, go to [email or web address].    Your next appointment:   Based on the results of your Cardiac CT   The format for your next appointment:   In Person  Provider:   You may see Kate Sable, MD or one of the following Advanced Practice Providers on your designated Care Team:    Murray Hodgkins, NP  Christell Faith, PA-C  Marrianne Mood, PA-C  Cadence Ojus, [place]  Laurann Montana, NP    Other  Instructions n/a

## 2020-12-23 NOTE — Progress Notes (Signed)
Office Visit    Patient Name: Sarah Harper Date of Encounter: 12/23/2020  PCP:  Abner Greenspan, MD   [place]  Cardiologist:  Kate Sable, MD  Advanced Practice Provider:  Arvil Chaco, PA-C Electrophysiologist:  None  [id number]   Chief Complaint    Chief Complaint  Patient presents with  . Follow-up    Follow up. Medications verbally reviewed with patient.     80 year old female with history of hypertension, hyperlipidemia, hypothyroidism, OSA on CPAP, GERD, anxiety, migraine headaches, IBS, history of stroke, tachypalpitations, and who is seen today for BP follow-up.    Past Medical History    Past Medical History:  Diagnosis Date  . DDD (degenerative disc disease)    chronic back pain  . ETD (eustachian tube dysfunction)   . GERD (gastroesophageal reflux disease)   . IBS (irritable bowel syndrome)   . Meniere's disease   . Migraine    still gets visual aura from time to time  . Osteoporosis   . Sleep apnea    CPAP  . Syncopal episodes    after work-up - possible seizures?  . Thyroid disease    hypothyroid   Past Surgical History:  Procedure Laterality Date  . APPENDECTOMY    . CATARACT EXTRACTION W/PHACO Right 05/21/2019   Procedure: CATARACT EXTRACTION PHACO AND INTRAOCULAR LENS PLACEMENT ([place]) RIGHT panoptix lens  00:35.0  21.7%  7.61;  Surgeon: Leandrew Koyanagi, MD;  Location: [place];  Service: Ophthalmology;  Laterality: Right;  sleep apnea requests early  . CATARACT EXTRACTION W/PHACO Left 06/11/2019   Procedure: CATARACT EXTRACTION PHACO AND INTRAOCULAR LENS PLACEMENT ([facility]) LEFT PANOPTIX TORIC LENS  00:50.0  20.3%  10.21;  Surgeon: Leandrew Koyanagi, MD;  Location: [place];  Service: Ophthalmology;  Laterality: Left;  sleep apnea requests early  . COLONOSCOPY     last 2012 - [facility]  . ESOPHAGOGASTRODUODENOSCOPY     Multiple, last 01/07/2017 with Savary dilation to 18 mm  .  HEMORRHOID BANDING  2018   [facility]  . LUMBAR DISC SURGERY  1984   L5     Allergies  Allergies  Allergen Reactions  . Nsaids     GI discomfort and gastritis  . Tolmetin Other (See Comments)    GI discomfort and gastritis    History of Present Illness    Sarah Harper is a 80 y.o. female with PMH as above.  She notes family history of a nephew that had his heart valve replaced in his 74s and deceased from a pulmonary embolism.  She has history of tachypalpitations on review of previous [facility] documentation.  She underwent 2014 monitor, which showed NSR with frequent PACs and occasional PVCs.  10/2020 echocardiogram with bubble study as below showed normal LVEF, read by an outside cardiologist.  Previous carotid study as below with less than 40% narrowing.  Most recent CTA of head and neck showed similar plaque findings at the right greater than left ICA origins with less than 50% stenosis.  No proximal intracranial vessel occlusion was noted.  She was last seen by her primary cardiologist 03/22/2020.  At that time, she reported lower extremity edema and symptoms consistent with venous insufficiency.  Lower extremity venous ultrasound showed reflux.  At RTC, she reported prednisone for right ankle swelling.  Compression stockings were helping with her symptoms.  She was most recently seen in at [facility] hospital 3/21-3/23 and 3/23-3/24 or CVA and strokelike symptoms.  As  above, 3/21 CTA of head and neck was performed and without any evidence of occlusion.  3/21 MRI brain revealed left frontal lobe punctate acute infarct and small vessel disease.  She was evaluated by neurology with recommendation for DAPT for 21 days, followed by ASA monotherapy and lifelong statin..  Event monitor was recommended.  Echo performed showed normal LVEF and no intracardiac shunt.  She was discharged on DAPT with ASA and Plavix.  While in the hospital, she developed significant anxiety with BP elevated.  BP was also noted  to be labile on review of hospital pressures.  She did not tolerate hydralazine.  She reported stress concerning her husband.  Metoprolol was added to her medication regimen.  At discharge, blood pressure 193/97 with HR 110 bpm.  Also noted was discharge potassium of 3.6.  Of note, cardiology was not consulted during either admission.  Seen 11/19/20 and not feeling well with reduced appetite and wt loss. She reported that, despite CPAP compliance, she frequently woke at 4AM with heart racing. She also noted racing HR at other times. She occasionally enjoyed potato chips. She reported nausea and burning up to her throat. She was taking dexlansoprazole. Recommendation was for Toprol 50mg  daily with 0.5 to 1 tablet lopressor 25mg  for racing HR. She was continued on her PPI. AT RTC, CMET and LDL recommended.   On 11/29/20, she sent a MyChart message of elevate BP while asleep. She reported SBP 160s and trouble thinking clearly.  On 12/04/20, she was seen in the ED for elevated BP. She was reportedly then told by her neurologist to present to the ED. BP in ED 176/68. She was discharged home with recommendation for PCP follow-up.  Seen 12/08/2020 after recent trip to the emergency department for elevated BP.  She reported frequently waking from sleep with elevated BP and SBP above 160.  She was waking approximately twice per night with elevated BP.  She often felt flushing of her face.  Sometimes, she would feel very poorly after waking and as if waking from a dream but unable to remember the dream.  She reported daytime fatigue.  She was using her CPAP consistently and watching her salt and fluid intake.  No alcohol use.  She rarely uses caffeine and remain well-hydrated.  With this appointment, as well as discussion between visits, recommendation was for Toprol 50 mg once each morning, along with spironolactone 25 mg each morning.  She would then take amlodipine 10 mg before bed.  She reported some improvement with  this new regimen via MyChart message between visits.  She was monitoring her blood pressure with both left wrist cuff and brachial cuff.  She had an upcoming visit to the beach with her husband.  Today, 12/23/2020, she returns to clinic and notes that she has been doing a lot better on her most recent medication changes as above.  She continues to note fatigue as her worst sx, and she is very hopeful that this will dissipate soon.  She denies any chest pain or shortness of breath.  Breathing is stable. No signs or symptoms of volume overload.  She does report frequent urination on the spironolactone. She reports ongoing brain fog, though her brain fog is improved from previous visits, possibly due to waking less often or BP more controlled.  She does note some dizziness with her lower BP but not significantly changed from previous visits if not improved. She reports feeling tired all of the time, even when she is well  slept and with good blood pressure, which is concerning to her. She has been waking up less often as previously noted, which she finds reassuring. Her nausea now only occurs only sometimes and is much improved with taking her medications earlier /  an hour before bed, as she figured out she would get reflux if she took her medications right before bed, which was causing her nausea.  Her appetite is improving. She notes some some ankle edema with increased dose amlodipine, resolving with compression stockings. She brings with her a blood pressure log that shows readings over the last few days between 107-138 systolic and 56-76 diastolic with heart rate 60s to 70s.  No recent falls. No LOC. No s/sx of bleeding. She enjoyed the beach with her husband. She has her coronary CT scan today.  Home Medications    Current Outpatient Medications on File Prior to Visit  Medication Sig Dispense Refill  . ALPRAZolam (XANAX) 0.25 MG tablet Take 1 tablet (0.25 mg total) by mouth 2 (two) times daily as needed for  anxiety (with caution of sedation). 30 tablet 0  . amLODipine (NORVASC) 10 MG tablet Take 1 tablet (10 mg total) by mouth daily. 30 tablet 2  . aspirin EC 81 MG EC tablet Take 1 tablet (81 mg total) by mouth daily. Swallow whole. 30 tablet 11  . atorvastatin (LIPITOR) 40 MG tablet Take 1 tablet (40 mg total) by mouth at bedtime. 90 tablet 3  . b complex vitamins tablet Take 1 tablet by mouth daily.    . butalbital-acetaminophen-caffeine (FIORICET) 50-325-40 MG tablet Take 1 tablet by mouth every 6 (six) hours as needed. 60 tablet 0  . Calcium Carb-Cholecalciferol (CALCIUM 600 + D PO) Take 1 tablet by mouth daily.    Marland Kitchen dexlansoprazole (DEXILANT) 60 MG capsule Take 1 capsule (60 mg total) by mouth daily. 90 capsule 3  . diclofenac sodium (VOLTAREN) 1 % GEL APPLY 2 GRAMS TOPICALLY FOUR TIMES A DAY TO AFFECTED AREAS 300 g 3  . estradiol (ESTRACE) 0.1 MG/GM vaginal cream Insert small amount (1 cm) in vagina every other day 42.5 g 3  . levothyroxine (SYNTHROID) 88 MCG tablet Take 1 tablet (88 mcg total) by mouth daily before breakfast. 90 tablet 3  . linaclotide (LINZESS) 145 MCG CAPS capsule Take 1 capsule (145 mcg total) by mouth daily. 90 capsule 3  . metoprolol succinate (TOPROL XL) 50 MG 24 hr tablet Take 1 tablet (50 mg total) by mouth daily. Take with or immediately following breakfast. 30 tablet 11  . mometasone (NASONEX) 50 MCG/ACT nasal spray Place 2 sprays into the nose daily as needed.    Marland Kitchen spironolactone (ALDACTONE) 25 MG tablet Take 1 tablet (25 mg total) by mouth daily. Take with or immediately following breakfast. 30 tablet 2  . sucralfate (CARAFATE) 1 g tablet TAKE 1 TABLET (1 G TOTAL) BY MOUTH 4 (FOUR) TIMES DAILY - WITH MEALS AND AT BEDTIME. 120 tablet 2  . metoprolol tartrate (LOPRESSOR) 50 MG tablet Take 1 tablet (50 mg total) by mouth once for 1 dose. Take 2 hours before your Cardiac CTA 1 tablet 0   No current facility-administered medications on file prior to visit.    Review  of Systems    She denies chest pain, dyspnea, pnd, orthopnea, v, syncope, weight gain.  She reports some ankle edema on increased dose amlodipine, resolving with compression stockings.  She reports improved appetite and less GERD sx since taking her medications 1 hour before bed.  She wakes less throughout the night with less racing HR at night / flushing at night. Brain fog is improving. Dizziness stable to improved. She reports ongoing fatigue. All other systems reviewed and are otherwise negative except as noted above.   Physical Exam   VS:  BP 110/68 (BP Location: Left Arm, Patient Position: Sitting, Cuff Size: Normal)   Pulse (!) 57   Ht 5\' 5"  (1.651 m)   Wt 135 lb (61.2 kg)   SpO2 96%   BMI 22.47 kg/m  , BMI Body mass index is 22.47 kg/m. GEN: Thin and elderly female, in no acute distress.  Mask in place.  Joined by her husband. HEENT: normal. Neck: Supple, no JVD, carotid bruits, or masses. Cardiac:bradycardic but regular, 1/6 systolic murmur, rubs, or gallops. No clubbing, cyanosis. No pitting edema.  Radials/DP/PT 2+ and equal bilaterally.  Respiratory:  Respirations regular and unlabored, clear to auscultation bilaterally. GI: Soft, nontender, nondistended, BS + x 4. MS: no deformity or atrophy. Skin: warm and dry, no rash. Neuro:  Strength and sensation are intact. Psych: Normal affect.  Accessory Clinical Findings    ECG personally reviewed by me today - SB with first degreee AVB, 57bpm, PRi 252ms and QRS 68ms and immediately before her CT scan  VITALS Reviewed today   Temp Readings from Last 3 Encounters:  12/04/20 98.4 F (36.9 C) (Oral)  11/17/20 (!) 97.5 F (36.4 C) (Temporal)  11/11/20 98.6 F (37 C) (Oral)   BP Readings from Last 3 Encounters:  12/23/20 110/68  12/08/20 130/72  12/04/20 (!) 176/68   Pulse Readings from Last 3 Encounters:  12/23/20 (!) 57  12/08/20 61  12/04/20 63    Wt Readings from Last 3 Encounters:  12/23/20 135 lb (61.2 kg)   12/08/20 134 lb (60.8 kg)  12/04/20 134 lb 7.7 oz (61 kg)     LABS  reviewed today   Lab Results  Component Value Date   WBC 7.1 12/08/2020   HGB 13.2 12/08/2020   HCT 39.2 12/08/2020   MCV 88 12/08/2020   PLT 269 12/08/2020   Lab Results  Component Value Date   CREATININE 0.74 12/08/2020   BUN 12 12/08/2020   NA 134 12/08/2020   K 3.6 12/08/2020   CL 95 (L) 12/08/2020   CO2 24 12/08/2020   Lab Results  Component Value Date   ALT 31 12/08/2020   AST 22 12/08/2020   ALKPHOS 92 12/08/2020   BILITOT 0.4 12/08/2020   Lab Results  Component Value Date   CHOL 119 12/08/2020   HDL 74 12/08/2020   LDLCALC 33 12/08/2020   LDLDIRECT 115.5 02/12/2013   TRIG 50 12/08/2020   CHOLHDL 1.6 12/08/2020    Lab Results  Component Value Date   HGBA1C 5.7 (H) 11/09/2020   Lab Results  Component Value Date   TSH 3.695 11/10/2020     STUDIES/PROCEDURES reviewed today   Echocardiogram 11/09/2020 1. Left ventricular ejection fraction, by estimation, is 55 to 60%. The  left ventricle has normal function. The left ventricle has no regional  wall motion abnormalities. Left ventricular diastolic parameters were  normal.  2. Right ventricular systolic function is normal. The right ventricular  size is normal.  3. Left atrial size was mildly dilated.  4. The mitral valve is grossly normal. Mild mitral valve regurgitation.  5. The aortic valve is tricuspid. Aortic valve regurgitation is trivial.   Carotid study 02/06/2015 Notes Recorded by Leonie Man, MD on 02/06/2015 at 8:05  PM Normal Carotid [place] - < 40% narrowing.  2014 Transcribed via dictation from uploaded scan: 48-hour cardiac monitoring in 2014 showed NSR with frequent PACs and rare PVCs.  On review of EMR scan, heart rate varied from 114 bpm to 62 bpm.  Pauses were noted at 17 the longest of which was 29.1 seconds and given no attached data, suspect this is possibly an error (? 2.91 seconds likely).  SVT runs  equal 48 the longest of which is 5 beats and at a maximum rate of 192 bpm.  Ventricular couplets were present  ---------------- 5/3 7:50 AM Toprol 50 mg, spironolactone 25 mg 117/64, 67 bpm, 9:30 AM 115/63, 69 bpm, 9:32 AM 134/69, 64 bpm, 5 PM-face felt flushed 128/62, 61 bpm, 5:03 PM  12/22/20 Bps 138/76, HR 75 bpm, 6 AM 121/69, HR 64 bpm, 6:03 AM 113/64, 64 bpm, 6:05 AM 120/60, 70 bpm, 7 AM, Toprol 50 mg, spironolactone 25 mg 107/59, 71 bpm, 70 2 AM 114/62, 72 bpm, 70 5 AM (compression stockings/swollen ankles) 112/63, 69 bpm, 8:20 AM, (brain fog, tired) 123/65, 62 bpm, 10 AM 117/60, 65 bpm, 8:50 PM amlodipine 10 mg statin 40 mg 111/56, 64 bpm, 8:52 PM 108/56, 64 bpm, 8:53 PM  12/23/2020 131/66, 67 bpm, 4:27 AM (woke up for bathroom) 116/64, 64 bpm, 4:30 AM 121/61, 65 bpm, 4:35 AM 139/69, 67 bpm, 6:56 AM 113/70, 65 bpm, 7 AM-no Toprol or spironolactone in preparation for scan 122/62, 67 bpm, 9:30 AM 136/69, 63 bpm, 12:35 PM 135/68, 63 bpm, 12:37 PM 12:45 PM took metoprolol tartrate 50 mg in preparation for scan with subsequent clinic BP 110/68  Assessment & Plan   Racing heart rate / tachypalpitations pSVT / Asx Bradycardia 1st degree AVB --Reports less frequent waking up in the middle of the night with elevated BP. Previously, she would wake with racing heart rate, twice nightly, which improved with medication changes as above. Previous 2014 cardiac monitoring with sinus pauses and pSVT. Bradycardic on EKG with 1st degree AVB, which we will monitor closely on BB and CCB. Asx bradycardia per pt. Given history of CVA, we have discussed recommendation for Zio versus loop. If coronary CT without evidence of significant CAD, recommend ZioXT for 2 weeks to rule out arrhythmia or significant pauses or HB. Pt preference is for Zio over Loop.Continue Toprol 50mg  in AM and amlodipine 10mg  at night as 1st degree AVB allows. Given her fatigue, future medication changes could include changing  Toprol to Coreg to see if improved energy. Will defer for now. Based on monitor results, EP referral may be needed.    Labile blood pressure / Essential Hypertension / Goal BP <130/80 --BP today more controlled on home log with pt now sleeping through the night. Please see transcribed Bps from pt log as above. Continue BB and CCB for now, as ultimately would like to consolidate these and avoid keeping her on both BB and CCB. In the future, we may find her energy improves on a different BB. Continue spironolactone, which has been helping her BP as well with increased urine output.  Continue to keep fluids under 2 L and salt under 2 g/day.  Continue current medications. Could consider Imdur in the future as below.    GERD/ Nausea / Barrett's esophagus --Given her Barrett's esophagus / pill esophagitis issues as outlined above, future considerations could include addition of Imdur. She reports already experiencing relief of nausea with taking her PM medications earlier before bed, which is reassuring.  Abnormal EKG --No chest pain or shortness of breath.  Risk factors for coronary insufficiency include age, hypertension, hyperlipidemia; therefore, she will obtain a coronary CT today. Further recommendations pending cCTA. Aggressive risk factor modification recommended. Continue ASA (discontinued Plavix 4/14), BB, statin. Consider addition of Imdur in the future as above.   History of CVA --3/21 admission showed punctate acute infarct involving the high left frontal lobe. TEE ordered but cancelled. CTA without LVO or significant carotid stenosis.  Loop and Zio cardiac monitoring discussed with pt preference for Zio over loop as above. Will plan for 2 week Zio XT. Echo with bubble study preferred over TEE, which will be better tolerated given Barrett's esophagus.  Echo during her admission showed normal LVEF, no PFO, no WMA.  Agree with ongoing neurology follow-up.  Continue ASA and statin. Optimize LDL  control. Recommend risk factor modification with BP, heart rate, and cholesterol control.  Continue current beta-blocker with close monitoring of first-degree AV block and heart rate.  Hyperlipidemia, LDL goal <70 -11/09/2020 total cholesterol 219 with LDL 109 and not at goal of below 70. Consider increasing atorvastatin to 80 mg versus addition of Zetia if significant CAD on CT.  Lower extremity edema, Venous insufficiency -- History of LEE, worsened throughout the day. Reports only ankle edema with increased dose amlodipine. Continue to recommend elevation, compression stockings, and Ace wraps as needed.  Sleep apnea on CPAP --Continue CPAP.  She is aware of the importance of ongoing CPAP use, as untreated sleep apnea is associated with cardiovascular findings including atrial fibrillation.  History of previous elevated hemoglobin A1c --Recheck per PCP.  Glycemic control recommended from cardiovascular standpoint.  Vascular disease -Follow up with [facility] as per scheduled. Continue to monitor carotids / subclavian / LE.  Continue ASA and statin.   Medication changes: None Labs ordered: None Studies / Imaging ordered: cCTA (already ordered and to be done today) Future considerations: Zio XT (preferred over loop per pt). Echo with bubble (preferred over TEE).  Consider Imdur. BB changes to see if improved energy. Statin / Zetia changes based on LDL/ALT and cCTA results. Losartan if further BP support needed. EKG at RTC: Yes Disposition: RTC after Zio XT monitoring    Arvil Chaco, PA-C 12/23/2020

## 2020-12-23 NOTE — Progress Notes (Signed)
Patient tolerated procedure well. Ambulate w/o difficulty. Sitting in chair drinking water provided. Encouraged to drink extra water today and reasoning explained. Verbalized understanding. All questions answered. ABC intact. No further needs. Discharge from procedure area w/o issues.  

## 2020-12-28 ENCOUNTER — Ambulatory Visit (INDEPENDENT_AMBULATORY_CARE_PROVIDER_SITE_OTHER): Payer: Medicare HMO

## 2020-12-28 ENCOUNTER — Telehealth: Payer: Self-pay | Admitting: *Deleted

## 2020-12-28 DIAGNOSIS — I639 Cerebral infarction, unspecified: Secondary | ICD-10-CM

## 2020-12-28 NOTE — Telephone Encounter (Signed)
-----   Message from Arvil Chaco, PA-C sent at 12/23/2020  6:48 PM EDT ----- Reviewed good news of low CAC score with patient same day over the phone. Spoke with her about a Zio XT x2 weeks, given her history of CVA (and to do before proceeding with a loop referral).   Patient is agreeable to this plan.  Please mail Ms. Eyerman a Zio XT x2 weeks. I told her we will call to confirm it is in the mail! Thank you!

## 2020-12-28 NOTE — Telephone Encounter (Signed)
Spoke to pt, notified zio monitor has been ordered to be mailed to her home.  Reviewed instructions provided for placement and advised to call number on box for any questions.  Also advised she may call office with any further questions.  Notified that she should receive within the next 48 hours but asked her to call me if has not received by end of this week.  Pt verbalized understanding and appreciative.

## 2020-12-30 ENCOUNTER — Ambulatory Visit: Admission: RE | Admit: 2020-12-30 | Payer: Medicare HMO | Source: Ambulatory Visit

## 2020-12-31 DIAGNOSIS — I639 Cerebral infarction, unspecified: Secondary | ICD-10-CM

## 2021-01-01 ENCOUNTER — Other Ambulatory Visit: Payer: Self-pay | Admitting: Physician Assistant

## 2021-01-01 DIAGNOSIS — K921 Melena: Secondary | ICD-10-CM

## 2021-01-01 NOTE — Progress Notes (Unsigned)
CBC for report of melena, fatigue, weakness, and brain fog. Medications updated to reflect changes --> patient is taking BB and CCB differently.

## 2021-01-02 ENCOUNTER — Other Ambulatory Visit
Admission: RE | Admit: 2021-01-02 | Discharge: 2021-01-02 | Disposition: A | Discharge status: Home / Self Care | Payer: Medicare HMO | Attending: Diagnostic Radiology | Admitting: Diagnostic Radiology

## 2021-01-02 ENCOUNTER — Telehealth: Payer: Self-pay | Admitting: Physician Assistant

## 2021-01-02 DIAGNOSIS — Z79899 Other long term (current) drug therapy: Secondary | ICD-10-CM | POA: Insufficient documentation

## 2021-01-02 DIAGNOSIS — I471 Supraventricular tachycardia: Secondary | ICD-10-CM | POA: Diagnosis present

## 2021-01-02 DIAGNOSIS — K921 Melena: Secondary | ICD-10-CM | POA: Diagnosis present

## 2021-01-02 DIAGNOSIS — I10 Essential (primary) hypertension: Secondary | ICD-10-CM

## 2021-01-02 LAB — BASIC METABOLIC PANEL
Anion gap: 10 (ref 5–15)
BUN: 13 mg/dL (ref 8–23)
CO2: 25 mmol/L (ref 22–32)
Calcium: 9.4 mg/dL (ref 8.9–10.3)
Chloride: 100 mmol/L (ref 98–111)
Creatinine, Ser: 0.65 mg/dL (ref 0.44–1.00)
GFR, Estimated: >60 mL/min (ref >60)
Glucose, Bld: 102 mg/dL — ABNORMAL HIGH (ref 70–99)
Potassium: 3.9 mmol/L (ref 3.5–5.1)
Sodium: 135 mmol/L (ref 135–145)

## 2021-01-02 LAB — CBC
HCT: 38.6 % (ref 36.0–46.0)
Hemoglobin: 12.8 g/dL (ref 12.0–15.0)
MCH: 29.4 pg (ref 26.0–34.0)
MCHC: 33.2 g/dL (ref 30.0–36.0)
MCV: 88.7 fL (ref 80.0–100.0)
Platelets: 245 10*3/uL (ref 150–400)
RBC: 4.35 MIL/uL (ref 3.87–5.11)
RDW: 13.5 % (ref 11.5–15.5)
WBC: 8.2 10*3/uL (ref 4.0–10.5)
nRBC: 0 % (ref 0.0–0.2)

## 2021-01-02 NOTE — Addendum Note (Signed)
Addended by: Sunday Spillers on: 01/02/2021 11:41 AM   Modules accepted: Orders

## 2021-01-05 ENCOUNTER — Ambulatory Visit: Payer: Medicare HMO | Admitting: Internal Medicine

## 2021-01-05 ENCOUNTER — Encounter: Payer: Self-pay | Admitting: Internal Medicine

## 2021-01-05 VITALS — BP 122/70 | HR 64 | Ht 65.0 in | Wt 132.0 lb

## 2021-01-05 DIAGNOSIS — K219 Gastro-esophageal reflux disease without esophagitis: Secondary | ICD-10-CM

## 2021-01-05 DIAGNOSIS — F411 Generalized anxiety disorder: Secondary | ICD-10-CM

## 2021-01-05 DIAGNOSIS — R12 Heartburn: Secondary | ICD-10-CM | POA: Diagnosis not present

## 2021-01-05 DIAGNOSIS — T887XXA Unspecified adverse effect of drug or medicament, initial encounter: Secondary | ICD-10-CM

## 2021-01-05 DIAGNOSIS — K5909 Other constipation: Secondary | ICD-10-CM

## 2021-01-05 NOTE — Progress Notes (Signed)
Sarah Harper 80 y.o. Aug 15, 1941 [id number]  Assessment & Plan:   Encounter Diagnoses  Name Primary?  . Heartburn Yes  . Medication side effects   . Gastroesophageal reflux disease, unspecified whether esophagitis present   . Chronic constipation   . Anxiety state     Statin was recommended because of the stroke.  At her age 70 does have the question the risk-benefit ratio of this considering the side effects.  At any rate there would be other options potentially as well.  I am going to have her hold her atorvastatin and she can retry sucralfate or antacids for the burning in the esophagus.  She was on sucralfate at one point when she messaged me the other month before coming for this appointment and I suggested she hold that.  I suspect that was not causing too much in the way of side effects.  She will continue to hold the Linzess we can use a lower dose of that if necessary.  She is to communicate with me by MyChart regarding the effects of statin with holding.  I think she should consider some counseling for the stressors and anxiety that might help.  I will see her back in the office in 2 months.  I appreciate the opportunity to care for this patient. CC: Tower, [name], MD Dr. Manuella Ghazi of [facility]    Subjective:   Chief Complaint: Medication side effects  HPI The patient is an elderly white woman here with GERD and IBS multiple questions about side effects from medications that have been perceived.  She has had a complicated set of situations, she has GERD and that was very well controlled until she had a stroke and had medication changes.  Admitted to the hospital in March with a stroke discovered an MRI after she had some speech difficulties and then also was readmitted had some chest pain and was discharged on 322 and 324.  A lot of medication changes that include amlodipine metoprolol atorvastatin and spironolactone.  She has had loose stools that been dark  she has reduced her Linzess that she was on for IBS constipation predominant.  She is on the 290 mcg dose.  She has had some brain fog swollen ankles fatigued and burning in her esophagus.  Amlodipine and metoprolol have been reduced.  She uses Linzess only intermittently in fact perhaps not much in the last several days if at all.  Stools have been dark brown but not melenic.  She feels like she is belching more and her quality of life is very reduced.  She is also under significant stress and the neurologist she is following up with through [facility] is trying to help her with that she has an EEG coming up because of some spells that could have been seizures.  Lexapro was started but she did not tolerate that.  She has a lot of stress and that she has 39 year old mother who is in a nursing home in [place] and she worries about her.  Her sister does help with that.  She does not undergo any counseling at this point.  She has not tried Xanax to see if that will help her GI symptoms that she is having she uses it sparingly she gets about 30 a year for many years and never takes all of them.  Additional stressors of the deaths of 2 nephews and 2 brothers in [employer].  Her mother really would rather move on and die because of her  quality of life being so poor and that is stressful to the patient. Allergies  Allergen Reactions  . Nsaids     GI discomfort and gastritis  . Tolmetin Other (See Comments)    GI discomfort and gastritis   Current Meds  Medication Sig  . ALPRAZolam (XANAX) 0.25 MG tablet Take 1 tablet (0.25 mg total) by mouth 2 (two) times daily as needed for anxiety (with caution of sedation).  Marland Kitchen amLODipine (NORVASC) 10 MG tablet Take 1 tablet (10 mg total) by mouth daily. (Patient taking differently: Take 5 mg by mouth daily.)  . aspirin EC 81 MG EC tablet Take 1 tablet (81 mg total) by mouth daily. Swallow whole.  Marland Kitchen atorvastatin (LIPITOR) 40 MG tablet Take 1 tablet (40 mg total) by mouth at  bedtime.  . cholecalciferol (VITAMIN D) 25 MCG (1000 UNIT) tablet Take 1,000 Units by mouth daily.  Marland Kitchen dexlansoprazole (DEXILANT) 60 MG capsule Take 1 capsule (60 mg total) by mouth daily.  . diclofenac sodium (VOLTAREN) 1 % GEL APPLY 2 GRAMS TOPICALLY FOUR TIMES A DAY TO AFFECTED AREAS  . estradiol (ESTRACE) 0.1 MG/GM vaginal cream Insert small amount (1 cm) in vagina every other day  . levothyroxine (SYNTHROID) 88 MCG tablet Take 1 tablet (88 mcg total) by mouth daily before breakfast.  . linaclotide (LINZESS) 145 MCG CAPS capsule Take 1 capsule (145 mcg total) by mouth daily.  . metoprolol tartrate (LOPRESSOR) 25 MG tablet Take 12.5 mg by mouth 2 (two) times daily.  . mometasone (NASONEX) 50 MCG/ACT nasal spray Place 2 sprays into the nose daily as needed.  Marland Kitchen spironolactone (ALDACTONE) 25 MG tablet Take 1 tablet (25 mg total) by mouth daily. Take with or immediately following breakfast.  . vitamin B-12 (CYANOCOBALAMIN) 100 MCG tablet Take 100 mcg by mouth daily.   Past Medical History:  Diagnosis Date  . DDD (degenerative disc disease)    chronic back pain  . ETD (eustachian tube dysfunction)   . GERD (gastroesophageal reflux disease)   . IBS (irritable bowel syndrome)   . Meniere's disease   . Migraine    still gets visual aura from time to time  . Osteoporosis   . Sleep apnea    CPAP  . Syncopal episodes    after work-up - possible seizures?  . Thyroid disease    hypothyroid  . TIA (transient ischemic attack) 11/09/2020   Past Surgical History:  Procedure Laterality Date  . APPENDECTOMY    . CATARACT EXTRACTION W/PHACO Right 05/21/2019   Procedure: CATARACT EXTRACTION PHACO AND INTRAOCULAR LENS PLACEMENT ([facility]) RIGHT panoptix lens  00:35.0  21.7%  7.61;  Surgeon: Leandrew Koyanagi, MD;  Location: [facility];  Service: Ophthalmology;  Laterality: Right;  sleep apnea requests early  . CATARACT EXTRACTION W/PHACO Left 06/11/2019   Procedure: CATARACT EXTRACTION PHACO  AND INTRAOCULAR LENS PLACEMENT ([facility]) LEFT PANOPTIX TORIC LENS  00:50.0  20.3%  10.21;  Surgeon: Leandrew Koyanagi, MD;  Location: [facility];  Service: Ophthalmology;  Laterality: Left;  sleep apnea requests early  . COLONOSCOPY     last 2012 - [facility]  . ESOPHAGOGASTRODUODENOSCOPY     Multiple, last 01/07/2017 with Savary dilation to 18 mm  . HEMORRHOID BANDING  2018   [facility]  . LUMBAR DISC SURGERY  1984   L5    Social History   Social History Narrative   Married, husband is a type I diabetic.  No children.   Elderly mother in her 13s lives in an  assisted living facility.   Very occasional white wine with dinner, never smoker no drug use.  Limited caffeine.   family history includes Breast cancer in her paternal aunt; Diabetes in her paternal aunt; Hypertension in her maternal grandfather and maternal grandmother.   Review of Systems As per HPI  Objective:   Physical Exam BP 122/70   Pulse 64   Ht 5\' 5"  (1.651 m)   Wt 132 lb (59.9 kg)   BMI 21.97 kg/m  Well-developed well-nourished white woman no acute distress Lungs clear Heart sounds are normal Abdomen benign   Data review was extensive including all labs in the EMR from this year, imaging studies from hospitalizations involving stroke, hospital discharge summaries and H&P's, neurology consult notes inpatient and outpatient

## 2021-01-05 NOTE — Patient Instructions (Signed)
Hold your atorvastatin per Dr Carlean Purl. Message [place] in a couple of weeks with an update on how your doing.  You may re-try the sucralfate.   Follow up with Dr Carlean Purl in 2 months.   I appreciate the opportunity to care for you. Silvano Rusk, MD, [facility]

## 2021-01-09 NOTE — Telephone Encounter (Signed)
err

## 2021-01-24 ENCOUNTER — Other Ambulatory Visit: Payer: Self-pay

## 2021-01-24 DIAGNOSIS — I10 Essential (primary) hypertension: Secondary | ICD-10-CM

## 2021-01-24 MED ORDER — SPIRONOLACTONE 25 MG PO TABS: 25.0000 mg | ORAL_TABLET | Freq: Every day | ORAL | 3 refills | Status: DC

## 2021-01-24 NOTE — Telephone Encounter (Signed)
Medication refilled and sent in to pharmacy per pt's request

## 2021-01-26 ENCOUNTER — Telehealth: Payer: Self-pay

## 2021-01-26 NOTE — Telephone Encounter (Signed)
Patient made zio monitor results. 3 mo f/u scheduled for 05/02/21 @ 9 am with JV. Discussed the reasoning for ref to EP for loop recorder consideration. Patient sts that is somethhing she will consider at a later date. She is "settling in" with her new medication changes and recent cva event. Patient adv that she is to contact our office sooner than Sept if needed or if she decides to proceed with the EP consult. Patient verbalized understanding and voiced appreciation for the call.

## 2021-02-11 ENCOUNTER — Encounter: Payer: Self-pay | Admitting: Family Medicine

## 2021-03-03 ENCOUNTER — Telehealth: Payer: Self-pay | Admitting: Family Medicine

## 2021-03-03 DIAGNOSIS — E7800 Pure hypercholesterolemia, unspecified: Secondary | ICD-10-CM

## 2021-03-03 DIAGNOSIS — E039 Hypothyroidism, unspecified: Secondary | ICD-10-CM

## 2021-03-03 DIAGNOSIS — Z0000 Encounter for general adult medical examination without abnormal findings: Secondary | ICD-10-CM

## 2021-03-03 NOTE — Telephone Encounter (Signed)
-----   Message from Cloyd Stagers, RT sent at 02/14/2021 10:59 AM EDT ----- Regarding: Lab Orders for Friday 7.15.2022 Please place lab orders for  Friday 7.15.2022, office visit for physical on Friday 7.22.2022 Thank you, Dyke Maes RT(R)

## 2021-03-04 ENCOUNTER — Other Ambulatory Visit (INDEPENDENT_AMBULATORY_CARE_PROVIDER_SITE_OTHER): Payer: Medicare HMO

## 2021-03-04 ENCOUNTER — Other Ambulatory Visit: Payer: Self-pay

## 2021-03-04 DIAGNOSIS — Z0000 Encounter for general adult medical examination without abnormal findings: Secondary | ICD-10-CM

## 2021-03-04 DIAGNOSIS — E039 Hypothyroidism, unspecified: Secondary | ICD-10-CM | POA: Diagnosis not present

## 2021-03-04 DIAGNOSIS — E7800 Pure hypercholesterolemia, unspecified: Secondary | ICD-10-CM

## 2021-03-04 LAB — COMPREHENSIVE METABOLIC PANEL
ALT: 16 U/L (ref 0–35)
AST: 19 U/L (ref 0–37)
Albumin: 4.5 g/dL (ref 3.5–5.2)
Alkaline Phosphatase: 135 U/L — ABNORMAL HIGH (ref 39–117)
BUN: 15 mg/dL (ref 6–23)
CO2: 30 mEq/L (ref 19–32)
Calcium: 9.9 mg/dL (ref 8.4–10.5)
Chloride: 98 mEq/L (ref 96–112)
Creatinine, Ser: 0.69 mg/dL (ref 0.40–1.20)
GFR: 82.36 mL/min (ref >60.00)
Glucose, Bld: 90 mg/dL (ref 70–99)
Potassium: 4.6 mEq/L (ref 3.5–5.1)
Sodium: 135 mEq/L (ref 135–145)
Total Bilirubin: 0.6 mg/dL (ref 0.2–1.2)
Total Protein: 6.8 g/dL (ref 6.0–8.3)

## 2021-03-04 LAB — CBC WITH DIFFERENTIAL/PLATELET
Basophils Absolute: 0.1 10*3/uL (ref 0.0–0.1)
Basophils Relative: 1 % (ref 0.0–3.0)
Eosinophils Absolute: 0.3 10*3/uL (ref 0.0–0.7)
Eosinophils Relative: 5.7 % — ABNORMAL HIGH (ref 0.0–5.0)
HCT: 38.2 % (ref 36.0–46.0)
Hemoglobin: 12.8 g/dL (ref 12.0–15.0)
Lymphocytes Relative: 27 % (ref 12.0–46.0)
Lymphs Abs: 1.5 10*3/uL (ref 0.7–4.0)
MCHC: 33.6 g/dL (ref 30.0–36.0)
MCV: 88.2 fl (ref 78.0–100.0)
Monocytes Absolute: 0.5 10*3/uL (ref 0.1–1.0)
Monocytes Relative: 9.1 % (ref 3.0–12.0)
Neutro Abs: 3.1 10*3/uL (ref 1.4–7.7)
Neutrophils Relative %: 57.2 % (ref 43.0–77.0)
Platelets: 275 10*3/uL (ref 150.0–400.0)
RBC: 4.33 Mil/uL (ref 3.87–5.11)
RDW: 14.5 % (ref 11.5–15.5)
WBC: 5.5 10*3/uL (ref 4.0–10.5)

## 2021-03-04 LAB — LIPID PANEL
Cholesterol: 189 mg/dL (ref 0–200)
HDL: 68.1 mg/dL (ref >39.00)
LDL Cholesterol: 109 mg/dL — ABNORMAL HIGH (ref 0–99)
NonHDL: 120.49
Total CHOL/HDL Ratio: 3
Triglycerides: 58 mg/dL (ref 0.0–149.0)
VLDL: 11.6 mg/dL (ref 0.0–40.0)

## 2021-03-04 LAB — TSH: TSH: 2.36 u[IU]/mL (ref 0.35–5.50)

## 2021-03-11 ENCOUNTER — Other Ambulatory Visit: Payer: Self-pay

## 2021-03-11 ENCOUNTER — Ambulatory Visit (INDEPENDENT_AMBULATORY_CARE_PROVIDER_SITE_OTHER): Payer: Medicare HMO | Admitting: Family Medicine

## 2021-03-11 ENCOUNTER — Encounter: Payer: Self-pay | Admitting: Family Medicine

## 2021-03-11 ENCOUNTER — Telehealth: Payer: Self-pay

## 2021-03-11 VITALS — BP 122/70 | HR 101 | Temp 97.8°F | Ht 64.25 in | Wt 130.1 lb

## 2021-03-11 DIAGNOSIS — Z0000 Encounter for general adult medical examination without abnormal findings: Secondary | ICD-10-CM

## 2021-03-11 DIAGNOSIS — E039 Hypothyroidism, unspecified: Secondary | ICD-10-CM

## 2021-03-11 DIAGNOSIS — I10 Essential (primary) hypertension: Secondary | ICD-10-CM | POA: Diagnosis not present

## 2021-03-11 DIAGNOSIS — M8589 Other specified disorders of bone density and structure, multiple sites: Secondary | ICD-10-CM

## 2021-03-11 DIAGNOSIS — Z1211 Encounter for screening for malignant neoplasm of colon: Secondary | ICD-10-CM

## 2021-03-11 DIAGNOSIS — E2839 Other primary ovarian failure: Secondary | ICD-10-CM

## 2021-03-11 DIAGNOSIS — Z1231 Encounter for screening mammogram for malignant neoplasm of breast: Secondary | ICD-10-CM

## 2021-03-11 DIAGNOSIS — R748 Abnormal levels of other serum enzymes: Secondary | ICD-10-CM | POA: Insufficient documentation

## 2021-03-11 DIAGNOSIS — F430 Acute stress reaction: Secondary | ICD-10-CM

## 2021-03-11 DIAGNOSIS — E7800 Pure hypercholesterolemia, unspecified: Secondary | ICD-10-CM

## 2021-03-11 MED ORDER — LEVOTHYROXINE SODIUM 88 MCG PO TABS: 88.0000 ug | ORAL_TABLET | Freq: Every day | ORAL | 3 refills | Status: DC

## 2021-03-11 MED ORDER — DEXLANSOPRAZOLE 60 MG PO CPDR: 60.0000 mg | DELAYED_RELEASE_CAPSULE | Freq: Every day | ORAL | 3 refills | Status: DC

## 2021-03-11 MED ORDER — LINACLOTIDE 145 MCG PO CAPS: 145.0000 ug | ORAL_CAPSULE | Freq: Every day | ORAL | 3 refills | Status: DC

## 2021-03-11 NOTE — Progress Notes (Signed)
Subjective:    Patient ID: Sarah Harper, female    DOB: 15-Jun-1941, 80 y.o.   MRN: [id number]  This visit occurred during the SARS-CoV-2 public health emergency.  Safety protocols were in place, including screening questions prior to the visit, additional usage of staff PPE, and extensive cleaning of exam room while observing appropriate contact time as indicated for disinfecting solutions.   HPI Here for health maintenance exam and to review chronic medical problems    Wt Readings from Last 3 Encounters:  03/11/21 130 lb 2 oz (59 kg)  01/05/21 132 lb (59.9 kg)  12/23/20 135 lb (61.2 kg)   22.16 kg/m  Struggling with brain fog  Gets stressed and overwhelmed easily (causes her pulse to be high)   Has appt upcoming with Dr Nicolasa Ducking for mental health    Zoster status -has not had shingrix yet due to multiple medical problems  Covid immunized  Flu shot-fall Td 5/13 Pna vac-utd  Mammogram 8/21, needs to schedule  Self breast exam-no lumps  Dexa 12/17-osteopenia Declines another dexa this year  Falls -had a fall with episode of sudden onset vertigo, hurt her back  Did some PT at home  Fractures-none  Supplements takes ca and D Exercise -physical therapy  Back problems/disc Going to chiropractor  Will be starting prednisone soon   Colon cancer screening -cologuard neg 6/19   Hypertension with h/o stroke  Amlodipine 5 mg daily  Aldactone 25 mg daily  Metoprolol 25 mg bid   BP Readings from Last 3 Encounters:  03/11/21 122/70  01/05/21 122/70  12/23/20 (!) 117/59    Pulse Readings from Last 3 Encounters:  03/11/21 (!) 101  01/05/21 64  12/23/20 60    Hypothyroidism  Pt has no clinical changes No change in energy level/ hair or skin/ edema and no tremor Lab Results  Component Value Date   TSH 2.36 03/04/2021    Levothyroxine 88 mcg daily   Hyperlipidemia Lab Results  Component Value Date   CHOL 189 03/04/2021   CHOL 119 12/08/2020   CHOL 219 (H)  11/09/2020   Lab Results  Component Value Date   HDL 68.10 03/04/2021   HDL 74 12/08/2020   HDL 97 11/09/2020   Lab Results  Component Value Date   LDLCALC 109 (H) 03/04/2021   LDLCALC 33 12/08/2020   LDLCALC 109 (H) 11/09/2020   Lab Results  Component Value Date   TRIG 58.0 03/04/2021   TRIG 50 12/08/2020   TRIG 67 11/09/2020   Lab Results  Component Value Date   CHOLHDL 3 03/04/2021   CHOLHDL 1.6 12/08/2020   CHOLHDL 2.3 11/09/2020   Lab Results  Component Value Date   LDLDIRECT 115.5 02/12/2013   LDLDIRECT 109.9 12/19/2010   LDLDIRECT 117.7 12/17/2009   Was prev on atorvastatin 40 mg daily and GI stopped it due to side effects  Decided it was not worth   Neurology is watching cognitive status -working on that  Some anxiety -worse   Sees GI for IBS and GERD Takes dexilant  Vit B12 level was high -she cut to three times per week  GI d/c her statin for side eff Stressors also contribute to GI symptoms   Lab Results  Component Value Date   CREATININE 0.69 03/04/2021   BUN 15 03/04/2021   NA 135 03/04/2021   K 4.6 03/04/2021   CL 98 03/04/2021   CO2 30 03/04/2021   Lab Results  Component Value Date   ALT  16 03/04/2021   AST 19 03/04/2021   ALKPHOS 135 (H) 03/04/2021   BILITOT 0.6 03/04/2021    Lab Results  Component Value Date   WBC 5.5 03/04/2021   HGB 12.8 03/04/2021   HCT 38.2 03/04/2021   MCV 88.2 03/04/2021   PLT 275.0 03/04/2021   Patient Active Problem List   Diagnosis Date Noted   Elevated alkaline phosphatase level 03/11/2021   Palpitations 11/10/2020   Migraine    Hypertension, essential    Acute CVA (cerebrovascular accident) ([facility]) 11/08/2020   Right knee pain 09/26/2019   Headache 09/26/2019   Fall 06/02/2019   Abrasion of right knee 06/02/2019   Urinary retention 05/09/2019   Syncope and collapse 12/12/2018   Colon cancer screening 02/05/2018   Degenerative joint disease (DJD) of lumbar spine 05/02/2017   Internal  hemorrhoids 05/02/2017   Generalized osteoarthritis of hand 12/11/2016   Stress reaction 12/11/2016   Estrogen deficiency 05/11/2016   Encounter for screening mammogram for breast cancer 05/11/2016   OSA (obstructive sleep apnea) 03/06/2016   Fatigue 01/25/2016   Right ankle swelling 10/18/2015   Left knee pain 10/18/2015   Snoring 10/18/2015   Routine general medical examination at a health care facility 02/28/2015   Encounter for Medicare annual wellness exam 02/06/2013   Hyperlipidemia 08/27/2012   Irregular heart beat 08/05/2012   Gynecological examination 12/20/2010   BARRETTS ESOPHAGUS 10/01/2010   Osteopenia 12/17/2009   BACK PAIN, LUMBAR 07/02/2009   IRRITABLE BOWEL SYNDROME 04/02/2009   POSTMENOPAUSAL STATUS 12/10/2008   Hypothyroidism 12/05/2007   Meniere's disease 12/05/2007   GERD 12/05/2007   Past Medical History:  Diagnosis Date   DDD (degenerative disc disease)    chronic back pain   ETD (eustachian tube dysfunction)    GERD (gastroesophageal reflux disease)    IBS (irritable bowel syndrome)    Meniere's disease    Migraine    still gets visual aura from time to time   Osteoporosis    Sleep apnea    CPAP   Syncopal episodes    after work-up - possible seizures?   Thyroid disease    hypothyroid   TIA (transient ischemic attack) 11/09/2020   Past Surgical History:  Procedure Laterality Date   APPENDECTOMY     CATARACT EXTRACTION W/PHACO Right 05/21/2019   Procedure: CATARACT EXTRACTION PHACO AND INTRAOCULAR LENS PLACEMENT ([facility]) RIGHT panoptix lens  00:35.0  21.7%  7.61;  Surgeon: Leandrew Koyanagi, MD;  Location: [facility];  Service: Ophthalmology;  Laterality: Right;  sleep apnea requests early   CATARACT EXTRACTION W/PHACO Left 06/11/2019   Procedure: CATARACT EXTRACTION PHACO AND INTRAOCULAR LENS PLACEMENT ([facility]) LEFT PANOPTIX TORIC LENS  00:50.0  20.3%  10.21;  Surgeon: Leandrew Koyanagi, MD;  Location: [facility];  Service:  Ophthalmology;  Laterality: Left;  sleep apnea requests early   COLONOSCOPY     last 2012 - [facility]   ESOPHAGOGASTRODUODENOSCOPY     Multiple, last 01/07/2017 with Savary dilation to 18 mm   HEMORRHOID BANDING  2018   [facility]   LUMBAR DISC SURGERY  1984   L5    Social History   Tobacco Use   Smoking status: Never   Smokeless tobacco: Never  Vaping Use   Vaping Use: Never used  Substance Use Topics   Alcohol use: Not Currently    Comment: wine occasional   Drug use: No   Family History  Problem Relation Age of Onset   Diabetes Paternal Aunt    Breast cancer  Paternal Aunt    Hypertension Maternal Grandmother    Hypertension Maternal Grandfather    Allergies  Allergen Reactions   Nsaids     GI discomfort and gastritis   Tolmetin Other (See Comments)    GI discomfort and gastritis   Current Outpatient Medications on File Prior to Visit  Medication Sig Dispense Refill   ALPRAZolam (XANAX) 0.25 MG tablet Take 1 tablet (0.25 mg total) by mouth 2 (two) times daily as needed for anxiety (with caution of sedation). 30 tablet 0   amLODipine (NORVASC) 5 MG tablet Take 5 mg by mouth daily.     aspirin EC 81 MG EC tablet Take 1 tablet (81 mg total) by mouth daily. Swallow whole. 30 tablet 11   butalbital-acetaminophen-caffeine (FIORICET) 50-325-40 MG tablet Take 1 tablet by mouth every 6 (six) hours as needed. 60 tablet 0   Calcium Carb-Cholecalciferol (CALCIUM 600 + D PO) Take 1 tablet by mouth daily.     cholecalciferol (VITAMIN D) 25 MCG (1000 UNIT) tablet Take 1,000 Units by mouth daily.     cyanocobalamin 1000 MCG tablet Take 1,000 mcg by mouth 3 (three) times a week.     diclofenac sodium (VOLTAREN) 1 % GEL APPLY 2 GRAMS TOPICALLY FOUR TIMES A DAY TO AFFECTED AREAS 300 g 3   estradiol (ESTRACE) 0.1 MG/GM vaginal cream Insert small amount (1 cm) in vagina every other day 42.5 g 3   metoprolol tartrate (LOPRESSOR) 25 MG tablet Take 25 mg by mouth 2 (two) times daily.      mometasone (NASONEX) 50 MCG/ACT nasal spray Place 2 sprays into the nose daily as needed.     spironolactone (ALDACTONE) 25 MG tablet Take 1 tablet (25 mg total) by mouth daily. Take with or immediately following breakfast. 90 tablet 3   sucralfate (CARAFATE) 1 g tablet TAKE 1 TABLET (1 G TOTAL) BY MOUTH 4 (FOUR) TIMES DAILY - WITH MEALS AND AT BEDTIME. 120 tablet 2   No current facility-administered medications on file prior to visit.    Review of Systems  Constitutional:  Positive for fatigue. Negative for activity change, appetite change, fever and unexpected weight change.  HENT:  Negative for congestion, ear pain, rhinorrhea, sinus pressure and sore throat.   Eyes:  Negative for pain, redness and visual disturbance.  Respiratory:  Negative for cough, shortness of breath and wheezing.   Cardiovascular:  Negative for chest pain and palpitations.  Gastrointestinal:  Negative for abdominal pain, blood in stool, constipation and diarrhea.  Endocrine: Negative for polydipsia and polyuria.  Genitourinary:  Negative for dysuria, frequency and urgency.  Musculoskeletal:  Negative for arthralgias, back pain and myalgias.  Skin:  Negative for pallor and rash.  Allergic/Immunologic: Negative for environmental allergies.  Neurological:  Negative for dizziness, syncope and headaches.  Hematological:  Negative for adenopathy. Does not bruise/bleed easily.  Psychiatric/Behavioral:  Positive for decreased concentration. Negative for dysphoric mood. The patient is not nervous/anxious.        Stressors       Objective:   Physical Exam Constitutional:      General: She is not in acute distress.    Appearance: Normal appearance. She is well-developed and normal weight. She is not ill-appearing or diaphoretic.  HENT:     Head: Normocephalic and atraumatic.     Right Ear: Tympanic membrane, ear canal and external ear normal.     Left Ear: Tympanic membrane, ear canal and external ear normal.     Nose:  Nose normal.  No congestion.     Mouth/Throat:     Mouth: Mucous membranes are moist.     Pharynx: Oropharynx is clear. No posterior oropharyngeal erythema.  Eyes:     General: No scleral icterus.    Extraocular Movements: Extraocular movements intact.     Conjunctiva/sclera: Conjunctivae normal.     Pupils: Pupils are equal, round, and reactive to light.  Neck:     Thyroid: No thyromegaly.     Vascular: No carotid bruit or JVD.  Cardiovascular:     Rate and Rhythm: Normal rate and regular rhythm.     Pulses: Normal pulses.     Heart sounds: Normal heart sounds.    No gallop.  Pulmonary:     Effort: Pulmonary effort is normal. No respiratory distress.     Breath sounds: Normal breath sounds. No wheezing.     Comments: Good air exch Chest:     Chest wall: No tenderness.  Abdominal:     General: Bowel sounds are normal. There is no distension or abdominal bruit.     Palpations: Abdomen is soft. There is no mass.     Tenderness: There is no abdominal tenderness.     Hernia: No hernia is present.  Genitourinary:    Comments: Breast exam: No mass, nodules, thickening, tenderness, bulging, retraction, inflamation, nipple discharge or skin changes noted.  No axillary or clavicular LA.     Musculoskeletal:        General: No tenderness. Normal range of motion.     Cervical back: Normal range of motion and neck supple. No rigidity. No muscular tenderness.     Right lower leg: No edema.     Left lower leg: No edema.     Comments: No kyphosis   Lymphadenopathy:     Cervical: No cervical adenopathy.  Skin:    General: Skin is warm and dry.     Coloration: Skin is not pale.     Findings: No erythema or rash.     Comments: Solar lentigines diffusely Some sks  Neurological:     Mental Status: She is alert. Mental status is at baseline.     Cranial Nerves: No cranial nerve deficit.     Motor: No abnormal muscle tone.     Coordination: Coordination normal.     Gait: Gait normal.      Deep Tendon Reflexes: Reflexes are normal and symmetric. Reflexes normal.  Psychiatric:        Attention and Perception: Attention normal.        Mood and Affect: Mood normal.        Cognition and Memory: Cognition and memory normal.     Comments: Pleasant  attentive          Assessment & Plan:   Problem List Items Addressed This Visit       Cardiovascular and Mediastinum   Hypertension, essential    bp in fair control at this time  BP Readings from Last 1 Encounters:  03/11/21 122/70  No changes needed Most recent labs reviewed  Disc lifstyle change with low sodium diet and exercise  Plan to continue  Hypertension with h/o stroke  Amlodipine 5 mg daily  Aldactone 25 mg daily  Metoprolol 25 mg bid       Relevant Medications   amLODipine (NORVASC) 5 MG tablet     Endocrine   Hypothyroidism    Hypothyroidism  Pt has no clinical changes No change in energy level/ hair or skin/ edema  and no tremor Lab Results  Component Value Date   TSH 2.36 03/04/2021     Plan to continue levothyroxine 88 mcg daily         Musculoskeletal and Integument   Osteopenia    dexa 2017 Declines another one at this time One fall/ with vertigo No fractures Doing PT for balance  Taking ca and D  Disc need for calcium/ vitamin D/ wt bearing exercise and bone density test every 2 y to monitor Disc safety/ fracture risk in detail           Other   Hyperlipidemia    Disc goals for lipids and reasons to control them Rev last labs with pt Rev low sat fat diet in detail TI had pt stop statin due to side effects  LDL of 109        Relevant Medications   amLODipine (NORVASC) 5 MG tablet   Routine general medical examination at a health care facility - Primary    Reviewed health habits including diet and exercise and skin cancer prevention Reviewed appropriate screening tests for age  Also reviewed health mt list, fam hx and immunization status , as well as social and family  history   See HPI Labs reviewed  Considering shingrix when able  covid immunized  Mammogram order done cologuard order done  Declines dexa / no fractures and one fall        Estrogen deficiency   Encounter for screening mammogram for breast cancer    Order done for mammogram- pt will schedule  Enc self breast exams  No lumps on exam today        Relevant Orders   MM 3D SCREEN BREAST BILATERAL   Stress reaction    Struggling with brain fog- gets overwhelmed easily  This tends to raise bp and pulse  Takes xanax prn  decl counseling       Colon cancer screening    cologuard ordered       Relevant Orders   Cologuard   Elevated alkaline phosphatase level    Will plan to re check this with isoenzymes  No sympotms

## 2021-03-11 NOTE — Telephone Encounter (Signed)
Pt was in for CPE this am and did not mention refills. She needs 90 day supply from [facility]: Levothyroxine, Linzess, and Dexilant.  Rxs sent electronically.

## 2021-03-11 NOTE — Patient Instructions (Addendum)
If you are interested in the new shingles vaccine (Shingrix) - call your local pharmacy to check on coverage and availability  If affordable, get on a wait list at your pharmacy to get the vaccine.  It's ok to get the 2nd covid booster   Don't forget to schedule your mammogram   Let's check alk phos with isoenzymes in a month

## 2021-03-13 NOTE — Assessment & Plan Note (Signed)
cologuard ordered.

## 2021-03-13 NOTE — Assessment & Plan Note (Signed)
Order done for mammogram- pt will schedule  Enc self breast exams  No lumps on exam today

## 2021-03-13 NOTE — Assessment & Plan Note (Signed)
dexa 2017 Declines another one at this time One fall/ with vertigo No fractures Doing PT for balance  Taking ca and D  Disc need for calcium/ vitamin D/ wt bearing exercise and bone density test every 2 y to monitor Disc safety/ fracture risk in detail

## 2021-03-13 NOTE — Assessment & Plan Note (Signed)
Will plan to re check this with isoenzymes  No sympotms

## 2021-03-13 NOTE — Assessment & Plan Note (Signed)
Reviewed health habits including diet and exercise and skin cancer prevention Reviewed appropriate screening tests for age  Also reviewed health mt list, fam hx and immunization status , as well as social and family history   See HPI Labs reviewed  Considering shingrix when able  covid immunized  Mammogram order done cologuard order done  Declines dexa / no fractures and one fall

## 2021-03-13 NOTE — Assessment & Plan Note (Signed)
bp in fair control at this time  BP Readings from Last 1 Encounters:  03/11/21 122/70   No changes needed Most recent labs reviewed  Disc lifstyle change with low sodium diet and exercise  Plan to continue  Hypertension with h/o stroke  Amlodipine 5 mg daily  Aldactone 25 mg daily  Metoprolol 25 mg bid

## 2021-03-13 NOTE — Assessment & Plan Note (Signed)
Hypothyroidism  Pt has no clinical changes No change in energy level/ hair or skin/ edema and no tremor Lab Results  Component Value Date   TSH 2.36 03/04/2021     Plan to continue levothyroxine 88 mcg daily

## 2021-03-13 NOTE — Assessment & Plan Note (Signed)
Struggling with brain fog- gets overwhelmed easily  This tends to raise bp and pulse  Takes xanax prn  decl counseling

## 2021-03-13 NOTE — Assessment & Plan Note (Signed)
Disc goals for lipids and reasons to control them Rev last labs with pt Rev low sat fat diet in detail TI had pt stop statin due to side effects  LDL of 109

## 2021-03-21 ENCOUNTER — Ambulatory Visit: Payer: Medicare HMO | Admitting: Internal Medicine

## 2021-03-21 ENCOUNTER — Telehealth: Payer: Self-pay

## 2021-03-21 ENCOUNTER — Telehealth: Payer: Self-pay | Admitting: Student in an Organized Health Care Education/Training Program

## 2021-03-21 ENCOUNTER — Ambulatory Visit: Payer: Medicare HMO

## 2021-03-21 ENCOUNTER — Ambulatory Visit (INDEPENDENT_AMBULATORY_CARE_PROVIDER_SITE_OTHER): Payer: Medicare HMO | Admitting: Cardiology

## 2021-03-21 ENCOUNTER — Encounter: Payer: Self-pay | Admitting: Cardiology

## 2021-03-21 ENCOUNTER — Other Ambulatory Visit: Payer: Self-pay

## 2021-03-21 VITALS — BP 146/70 | HR 58 | Ht 65.0 in | Wt 129.0 lb

## 2021-03-21 DIAGNOSIS — I10 Essential (primary) hypertension: Secondary | ICD-10-CM

## 2021-03-21 DIAGNOSIS — I8393 Asymptomatic varicose veins of bilateral lower extremities: Secondary | ICD-10-CM

## 2021-03-21 MED ORDER — IRBESARTAN 150 MG PO TABS: 150.0000 mg | ORAL_TABLET | Freq: Every day | ORAL | 6 refills | Status: DC

## 2021-03-21 NOTE — Telephone Encounter (Signed)
[facility] - Client TELEPHONE ADVICE RECORD [facility] Patient Name: Sarah Harper [id number] Gender: Female DOB: Jul 24, 1941 Age: 80 Y 9 M 11 D Return Phone Number: [phone] (Primary), [phone] (Secondary) Address: City/ State/ Zip: [place] [address] Client [facility] - Client Client Site [facility], Roque Lias - MD Contact Type Call Who Is Calling Patient / Member / Family / Caregiver Call Type Triage / Clinical Relationship To Patient Self Return Phone Number [phone] (Primary) Chief Complaint BLOOD PRESSURE HIGH - Systolic (top number) [id number] or greater Reason for Call Symptomatic / Request for Health Information Initial Comment She has a high blood pressure today. Currently on blood pressure medication due to a stroke. She has a reading of 200/97 Translation No Nurse Assessment Nurse: Laurance Flatten, RN, Geni Bers Date/Time (Eastern Time): 03/19/2021 8:13:15 PM Confirm and document reason for call. If symptomatic, describe symptoms. ---Caller stated her BP is 196/97 and HR 73. Caller stated she just now took her bedtime dosage of her BP medication. Does the patient have any new or worsening symptoms? ---Yes Will a triage be completed? ---Yes Related visit to physician within the last 2 weeks? ---No Does the PT have any chronic conditions? (i.e. diabetes, asthma, this includes High risk factors for pregnancy, etc.) ---Yes List chronic conditions. ---HTN, Hx of stroke, Thyroid disorder, GERD Is this a behavioral health or substance abuse call? ---No Guidelines Guideline Title Affirmed Question Affirmed Notes Nurse Date/Time (Eastern Time) Blood Pressure - High [id number] Systolic BP >= [id number] OR Diastolic >= 123456 AND [id number] having NO cardiac or neurologic symptoms [name] 03/19/2021 8:18:24 PM Disp. Time Eilene Ghazi Time) Disposition Final User 03/19/2021  8:11:39 PM Send to Urgent Queue Wisdom, Zach PLEASE NOTE: All timestamps contained within this report are represented as [place] Standard Time. CONFIDENTIALTY NOTICE: This fax transmission is intended only for the addressee. It contains information that is legally privileged, confidential or otherwise protected from use or disclosure. If you are not the intended recipient, you are strictly prohibited from reviewing, disclosing, copying using or disseminating any of this information or taking any action in reliance on or regarding this information. If you have received this fax in error, please notify us immediately by telephone so that we can arrange for its return to [place]. Phone: [phone], Toll-Free: [phone], Fax: [phone] Page: 2 of 2 Call Id: [id number] 03/19/2021 8:30:32 PM See HCP within 4 Hours (or PCP triage) Yes Laurance Flatten, RN, Althea Grimmer Disagree/Comply Comply Caller Understands Yes PreDisposition InappropriateToAsk Care Advice Given Per Guideline SEE HCP (OR PCP TRIAGE) WITHIN 4 HOURS: * IF OFFICE WILL BE OPEN: You need to be seen within the next 3 or 4 hours. Call your doctor (or NP/PA) now or as soon as the office opens. CALL BACK IF: * Weakness or numbness of the face, arm or leg on one side of the body occurs * Difficulty walking, difficulty talking, or severe headache occurs * Chest pain or difficulty breathing occurs * You become worse Referrals [facility] - ED

## 2021-03-21 NOTE — Patient Instructions (Addendum)
Medication Instructions:  - Your physician has recommended you make the following change in your medication:   1) STOP metoprolol tartrate  2) STOP spironolactone  3) START Irbesartan 150 mg- take 1 tablet by mouth once daily  *If you need a refill on your cardiac medications before your next appointment, please call your pharmacy*   Lab Work: - none ordered  If you have labs (blood work) drawn today and your tests are completely normal, you will receive your results only by: [place] (if you have MyChart) OR A paper copy in the mail If you have any lab test that is abnormal or we need to change your treatment, we will call you to review the results.   Testing/Procedures: - none ordered   Follow-Up: At [facility], you and your health needs are our priority.  As part of our continuing mission to provide you with exceptional heart care, we have created designated Provider Care Teams.  These Care Teams include your primary Cardiologist (physician) and Advanced Practice Providers (APPs -  Physician Assistants and Nurse Practitioners) who all work together to provide you with the care you need, when you need it.  We recommend signing up for the patient portal called "MyChart".  Sign up information is provided on this After Visit Summary.  MyChart is used to connect with patients for Virtual Visits (Telemedicine).  Patients are able to view lab/test results, encounter notes, upcoming appointments, etc.  Non-urgent messages can be sent to your provider as well.   To learn more about what you can do with MyChart, go to [email or web address].    Your next appointment:   4-6 week(s)  The format for your next appointment:   In Person  Provider:   You may see Kate Sable, MD or one of the following Advanced Practice Providers on your designated Care Team:   Murray Hodgkins, NP Christell Faith, PA-C Marrianne Mood, PA-C Cadence Kathlen Mody, [place]   Other  Instructions  Irbesartan Tablets What is this medication? IRBESARTAN (ir be SAR tan) is an angiotensin II receptor blocker, also known as an ARB. It treats high blood pressure. It can also slow kidney damage in somepatients. This medicine may be used for other purposes; ask your health care provider orpharmacist if you have questions. COMMON BRAND NAME(S): Avapro What should I tell my care team before I take this medication? They need to know if you have any of these conditions: heart failure kidney or liver disease an unusual or allergic reaction to irbesartan, other medicines, foods, dyes, or preservatives pregnant or trying to get pregnant breast-feeding How should I use this medication? Take this drug by mouth. Take it as directed on the prescription label at the same time every day. You can take it with or without food. If it upsets your stomach, take it with food. Keep taking it unless your health care providertells you to stop. Talk to your health care provider about the use of this drug in children.Special care may be needed. Overdosage: If you think you have taken too much of this medicine contact apoison control center or emergency room at once. NOTE: This medicine is only for you. Do not share this medicine with others. What if I miss a dose? If you miss a dose, take it as soon as you can. If it is almost time for yournext dose, take only that dose. Do not take double or extra doses. What may interact with this medication? diuretics, especially triamterene, spironolactone, or amiloride potassium  salts or potassium supplements This list may not describe all possible interactions. Give your health care provider a list of all the medicines, herbs, non-prescription drugs, or dietary supplements you use. Also tell them if you smoke, drink alcohol, or use illegaldrugs. Some items may interact with your medicine. What should I watch for while using this medication? Visit your doctor or  health care professional for regular checks on your progress. Check your blood pressure as directed. Ask your doctor or health care professional what your blood pressure should be and when you should contact him or her. Call your doctor or health care professional if you notice an irregularor fast heart beat. Women should inform their doctor if they wish to become pregnant or think they might be pregnant. There is a potential for serious side effects to an unborn child, particularly in the second or third trimester. Talk to your health careprofessional or pharmacist for more information. You may get drowsy or dizzy. Do not drive, use machinery, or do anything that needs mental alertness until you know how this drug affects you. Do not stand or sit up quickly, especially if you are an older patient. This reduces the risk of dizzy or fainting spells. Alcohol can make you more drowsy and dizzy.Avoid alcoholic drinks. Avoid salt substitutes unless you are told otherwise by your doctor or healthcare professional. Do not treat yourself for coughs, colds, or pain while you are taking this medicine without asking your doctor or health care professional for advice.Some ingredients may increase your blood pressure. What side effects may I notice from receiving this medication? Side effects that you should report to your doctor or health care professionalas soon as possible: confusion, dizziness, light headedness or fainting spells decreased amount of urine passed difficulty breathing or swallowing, hoarseness, or tightening of the throat fast or irregular heart beat or palpitations swelling of your face, lips, tongue, hands, or feet unusual rash or hives Side effects that usually do not require medical attention (report to yourdoctor or health care professional if they continue or are bothersome): decreased sexual function diarrhea fatigue or tiredness heartburn This list may not describe all possible side  effects. Call your doctor for medical advice about side effects. You may report side effects to FDA at1-800-FDA-1088. Where should I keep my medication? Keep out of the reach of children and pets. Store at room temperature between 15 and 30 degrees C (59 and 86 degrees F).Throw away any unused drug after the expiration date. NOTE: This sheet is a summary. It may not cover all possible information. If you have questions about this medicine, talk to your doctor, pharmacist, orhealth care provider.  2022 [employer] Standard (2019-03-11 19:05:16)

## 2021-03-21 NOTE — Telephone Encounter (Signed)
Pt has contacted cardiology and has an apt this afternoon with them.

## 2021-03-21 NOTE — Telephone Encounter (Signed)
I received a page from Sarah Harper at ~8:54 pm. She called because her BP was elevated with SBPs in the 150s. She denies having any chest pain, SOB, headache or vision changes. She was seen in clinic today Dr. Garen Lah and started on irbesartan 150 mg once daily and her metoprolol tartrate and spironolactone were discontinued.  She inquired about what blood pressure would be concerning for her to seek medical attention.  I counseled her that if she develops concerning symptoms such as chest pain, shortness of breath, vision changes, severe headache, then she should seek emergency medical attention in the ED.  I further instructed her that if her systolic blood pressure is sustained above 200 then to notify me overnight.  She verbalized an understanding.

## 2021-03-21 NOTE — Progress Notes (Signed)
Cardiology Office Note:    Date:  03/21/2021   ID:  Sarah Harper, DOB [birth year], MRN [id number]  PCP:  Abner Greenspan, MD  [facility] Cardiologist:  Kate Sable, MD  [place] Electrophysiologist:  None   Referring MD: Abner Greenspan, MD   Chief Complaint  Patient presents with   Other    Patient c.o BP being high. Meds reviewed verbally with patient.     History of Present Illness:    Sarah Harper is a 80 y.o. female with a hx of hypertension, degenerative joint disease, GERD, CVA, venous insufficiency who presents due to elevated blood pressure.  Patient has noticed elevated blood pressures over the past several weeks.  Systolics to 0000000 noted at home.  Recently started on Cymbalta due to generalized degenerative joint disease.  Currently on Lopressor, Norvasc, Aldactone for BP control.  She states taking too many medications and would like to cut back on salt.  Denies edema.   Prior notes echocardiogram on 01/2015 due to syncope which showed normal systolic function, EF 60 to 123456, normal diastolic function.  Past Medical History:  Diagnosis Date   DDD (degenerative disc disease)    chronic back pain   ETD (eustachian tube dysfunction)    GERD (gastroesophageal reflux disease)    IBS (irritable bowel syndrome)    Meniere's disease    Migraine    still gets visual aura from time to time   Osteoporosis    Sleep apnea    CPAP   Syncopal episodes    after work-up - possible seizures?   Thyroid disease    hypothyroid   TIA (transient ischemic attack) 11/09/2020    Past Surgical History:  Procedure Laterality Date   APPENDECTOMY     CATARACT EXTRACTION W/PHACO Right 05/21/2019   Procedure: CATARACT EXTRACTION PHACO AND INTRAOCULAR LENS PLACEMENT ([place]) RIGHT panoptix lens  00:35.0  21.7%  7.61;  Surgeon: Leandrew Koyanagi, MD;  Location: [facility];  Service: Ophthalmology;  Laterality: Right;  sleep apnea requests early   CATARACT  EXTRACTION W/PHACO Left 06/11/2019   Procedure: CATARACT EXTRACTION PHACO AND INTRAOCULAR LENS PLACEMENT ([facility]) LEFT PANOPTIX TORIC LENS  00:50.0  20.3%  10.21;  Surgeon: Leandrew Koyanagi, MD;  Location: [place];  Service: Ophthalmology;  Laterality: Left;  sleep apnea requests early   COLONOSCOPY     last 2012 - [facility]   ESOPHAGOGASTRODUODENOSCOPY     Multiple, last 01/07/2017 with Savary dilation to 18 mm   HEMORRHOID BANDING  2018   [facility]   LUMBAR DISC SURGERY  1984   L5     Current Medications: Current Meds  Medication Sig   ALPRAZolam (XANAX) 0.25 MG tablet Take 1 tablet (0.25 mg total) by mouth 2 (two) times daily as needed for anxiety (with caution of sedation).   amLODipine (NORVASC) 5 MG tablet Take 5 mg by mouth daily.   butalbital-acetaminophen-caffeine (FIORICET) 50-325-40 MG tablet Take 1 tablet by mouth every 6 (six) hours as needed.   Calcium Carb-Cholecalciferol (CALCIUM 600 + D PO) Take 1 tablet by mouth daily.   cholecalciferol (VITAMIN D) 25 MCG (1000 UNIT) tablet Take 1,000 Units by mouth daily.   cyanocobalamin 1000 MCG tablet Take 1,000 mcg by mouth 3 (three) times a week.   dexlansoprazole (DEXILANT) 60 MG capsule Take 1 capsule (60 mg total) by mouth daily.   diclofenac sodium (VOLTAREN) 1 % GEL APPLY 2 GRAMS TOPICALLY FOUR TIMES A DAY TO AFFECTED AREAS  estradiol (ESTRACE) 0.1 MG/GM vaginal cream Insert small amount (1 cm) in vagina every other day   irbesartan (AVAPRO) 150 MG tablet Take 1 tablet (150 mg total) by mouth daily.   levothyroxine (SYNTHROID) 88 MCG tablet Take 1 tablet (88 mcg total) by mouth daily before breakfast.   linaclotide (LINZESS) 145 MCG CAPS capsule Take 1 capsule (145 mcg total) by mouth daily.   mometasone (NASONEX) 50 MCG/ACT nasal spray Place 2 sprays into the nose daily as needed.   sucralfate (CARAFATE) 1 g tablet TAKE 1 TABLET (1 G TOTAL) BY MOUTH 4 (FOUR) TIMES DAILY - WITH MEALS AND AT BEDTIME.   [DISCONTINUED]  metoprolol tartrate (LOPRESSOR) 25 MG tablet Take 25 mg by mouth 2 (two) times daily.   [DISCONTINUED] spironolactone (ALDACTONE) 25 MG tablet Take 1 tablet (25 mg total) by mouth daily. Take with or immediately following breakfast.     Allergies:   Nsaids and Tolmetin   Social History   Socioeconomic History   Marital status: Married    Spouse name: Erland   Number of children: 0   Years of education: Not on file   Highest education level: Not on file  Occupational History   Occupation: Retired  Tobacco Use   Smoking status: Never   Smokeless tobacco: Never  Vaping Use   Vaping Use: Never used  Substance and Sexual Activity   Alcohol use: Not Currently    Comment: wine occasional   Drug use: No   Sexual activity: Not on file  Other Topics Concern   Not on file  Social History Narrative   Married, husband is a type I diabetic.  No children.   Elderly mother in her 46s lives in an assisted living facility.   Very occasional white wine with dinner, never smoker no drug use.  Limited caffeine.   Social Determinants of Health   Financial Resource Strain: Not on file  Food Insecurity: Not on file  Transportation Needs: Not on file  Physical Activity: Not on file  Stress: Not on file  Social Connections: Not on file     Family History: The patient's family history includes Breast cancer in her paternal aunt; Diabetes in her paternal aunt; Hypertension in her maternal grandfather and maternal grandmother.  ROS:   Please see the history of present illness.     All other systems reviewed and are negative.  EKGs/Labs/Other Studies Reviewed:    The following studies were reviewed today:   EKG:  EKG is  ordered today.  The ekg ordered today demonstrates sinus bradycardia.  Recent Labs: 11/10/2020: Magnesium 1.8 03/04/2021: ALT 16; BUN 15; Creatinine, Ser 0.69; Hemoglobin 12.8; Platelets 275.0; Potassium 4.6; Sodium 135; TSH 2.36  Recent Lipid Panel    Component Value  Date/Time   CHOL 189 03/04/2021 0746   CHOL 119 12/08/2020 1542   TRIG 58.0 03/04/2021 0746   HDL 68.10 03/04/2021 0746   HDL 74 12/08/2020 1542   CHOLHDL 3 03/04/2021 0746   VLDL 11.6 03/04/2021 0746   LDLCALC 109 (H) 03/04/2021 0746   LDLCALC 33 12/08/2020 1542   LDLDIRECT 115.5 02/12/2013 0755    Physical Exam:    VS:  BP (!) 146/70 (BP Location: Left Arm, Patient Position: Sitting, Cuff Size: Normal)   Pulse (!) 58   Ht '5\' 5"'$  (1.651 m)   Wt 129 lb (58.5 kg)   SpO2 99%   BMI 21.47 kg/m     Wt Readings from Last 3 Encounters:  03/21/21 129 lb (  58.5 kg)  03/11/21 130 lb 2 oz (59 kg)  01/05/21 132 lb (59.9 kg)     GEN:  Well nourished, well developed in no acute distress HEENT: Normal NECK: No JVD; No carotid bruits LYMPHATICS: No lymphadenopathy CARDIAC: RRR, no murmur RESPIRATORY:  Clear to auscultation without rales, wheezing or rhonchi  ABDOMEN: Soft, non-tender, non-distended MUSCULOSKELETAL: no edema; bilateral varicose veins noted SKIN: Warm and dry NEUROLOGIC:  Alert and oriented x 3 PSYCHIATRIC:  Normal affect   ASSESSMENT:    1. Primary hypertension   2. Varicose veins of both lower extremities, unspecified whether complicated     PLAN:    In order of problems listed above:  Hypertension, BP elevated, complains of being on too many meds.  Stop Lopressor, stop Aldactone.  Start irbesartan 150 mg daily, continue Norvasc. Varicose veins, leg raising, compression stocking advised  Follow-up in 6 weeks   Medication Adjustments/Labs and Tests Ordered: Current medicines are reviewed at length with the patient today.  Concerns regarding medicines are outlined above.  Orders Placed This Encounter  Procedures   EKG 12-Lead    Meds ordered this encounter  Medications   irbesartan (AVAPRO) 150 MG tablet    Sig: Take 1 tablet (150 mg total) by mouth daily.    Dispense:  30 tablet    Refill:  6    Stopping metoprolol & spironolactone     Patient  Instructions  Medication Instructions:  - Your physician has recommended you make the following change in your medication:   1) STOP metoprolol tartrate  2) STOP spironolactone  3) START Irbesartan 150 mg- take 1 tablet by mouth once daily  *If you need a refill on your cardiac medications before your next appointment, please call your pharmacy*   Lab Work: - none ordered  If you have labs (blood work) drawn today and your tests are completely normal, you will receive your results only by: [facility] (if you have MyChart) OR A paper copy in the mail If you have any lab test that is abnormal or we need to change your treatment, we will call you to review the results.   Testing/Procedures: - none ordered   Follow-Up: At [facility], you and your health needs are our priority.  As part of our continuing mission to provide you with exceptional Harper care, we have created designated Provider Care Teams.  These Care Teams include your primary Cardiologist (physician) and Advanced Practice Providers (APPs -  Physician Assistants and Nurse Practitioners) who all work together to provide you with the care you need, when you need it.  We recommend signing up for the patient portal called "MyChart".  Sign up information is provided on this After Visit Summary.  MyChart is used to connect with patients for Virtual Visits (Telemedicine).  Patients are able to view lab/test results, encounter notes, upcoming appointments, etc.  Non-urgent messages can be sent to your provider as well.   To learn more about what you can do with MyChart, go to [email or web address].    Your next appointment:   4-6 week(s)  The format for your next appointment:   In Person  Provider:   You may see Kate Sable, MD or one of the following Advanced Practice Providers on your designated Care Team:   Murray Hodgkins, NP Christell Faith, PA-C Marrianne Mood, PA-C Cadence Kathlen Mody, [place]   Other  Instructions  Irbesartan Tablets What is this medication? IRBESARTAN (ir be SAR tan) is an angiotensin II receptor blocker,  also known as an ARB. It treats high blood pressure. It can also slow kidney damage in somepatients. This medicine may be used for other purposes; ask your health care provider orpharmacist if you have questions. COMMON BRAND NAME(S): Avapro What should I tell my care team before I take this medication? They need to know if you have any of these conditions: Harper failure kidney or liver disease an unusual or allergic reaction to irbesartan, other medicines, foods, dyes, or preservatives pregnant or trying to get pregnant breast-feeding How should I use this medication? Take this drug by mouth. Take it as directed on the prescription label at the same time every day. You can take it with or without food. If it upsets your stomach, take it with food. Keep taking it unless your health care providertells you to stop. Talk to your health care provider about the use of this drug in children.Special care may be needed. Overdosage: If you think you have taken too much of this medicine contact apoison control center or emergency room at once. NOTE: This medicine is only for you. Do not share this medicine with others. What if I miss a dose? If you miss a dose, take it as soon as you can. If it is almost time for yournext dose, take only that dose. Do not take double or extra doses. What may interact with this medication? diuretics, especially triamterene, spironolactone, or amiloride potassium salts or potassium supplements This list may not describe all possible interactions. Give your health care provider a list of all the medicines, herbs, non-prescription drugs, or dietary supplements you use. Also tell them if you smoke, drink alcohol, or use illegaldrugs. Some items may interact with your medicine. What should I watch for while using this medication? Visit your doctor or  health care professional for regular checks on your progress. Check your blood pressure as directed. Ask your doctor or health care professional what your blood pressure should be and when you should contact him or her. Call your doctor or health care professional if you notice an irregularor fast Harper beat. Women should inform their doctor if they wish to become pregnant or think they might be pregnant. There is a potential for serious side effects to an unborn child, particularly in the second or third trimester. Talk to your health careprofessional or pharmacist for more information. You may get drowsy or dizzy. Do not drive, use machinery, or do anything that needs mental alertness until you know how this drug affects you. Do not stand or sit up quickly, especially if you are an older patient. This reduces the risk of dizzy or fainting spells. Alcohol can make you more drowsy and dizzy.Avoid alcoholic drinks. Avoid salt substitutes unless you are told otherwise by your doctor or healthcare professional. Do not treat yourself for coughs, colds, or pain while you are taking this medicine without asking your doctor or health care professional for advice.Some ingredients may increase your blood pressure. What side effects may I notice from receiving this medication? Side effects that you should report to your doctor or health care professionalas soon as possible: confusion, dizziness, light headedness or fainting spells decreased amount of urine passed difficulty breathing or swallowing, hoarseness, or tightening of the throat fast or irregular Harper beat or palpitations swelling of your face, lips, tongue, hands, or feet unusual rash or hives Side effects that usually do not require medical attention (report to yourdoctor or health care professional if they continue or are bothersome): decreased  sexual function diarrhea fatigue or tiredness heartburn This list may not describe all possible side  effects. Call your doctor for medical advice about side effects. You may report side effects to FDA at1-800-FDA-1088. Where should I keep my medication? Keep out of the reach of children and pets. Store at room temperature between 15 and 30 degrees C (59 and 86 degrees F).Throw away any unused drug after the expiration date. NOTE: This sheet is a summary. It may not cover all possible information. If you have questions about this medicine, talk to your doctor, pharmacist, orhealth care provider.  2022 Elsevier/Gold Standard (2019-03-11 19:05:16)    Signed, Kate Sable, MD  03/21/2021 5:03 PM     [facility]

## 2021-03-22 MED ORDER — CARVEDILOL 6.25 MG PO TABS: 6.2500 mg | ORAL_TABLET | Freq: Two times a day (BID) | ORAL | 3 refills | Status: DC

## 2021-03-25 ENCOUNTER — Telehealth: Payer: Self-pay

## 2021-03-25 MED ORDER — SPIRONOLACTONE 25 MG PO TABS: 25.0000 mg | ORAL_TABLET | Freq: Every day | ORAL | 3 refills | Status: DC

## 2021-03-25 MED ORDER — METOPROLOL TARTRATE 25 MG PO TABS: 25.0000 mg | ORAL_TABLET | Freq: Two times a day (BID) | ORAL | 3 refills | Status: DC

## 2021-03-25 NOTE — Telephone Encounter (Signed)
Called patient and relayed the below recommendations from Dr. Garen Lah. Patient was very hesitant and worried about switching back. She stated she did not understand why she was switched in the first place. I informed her per the last OV note, she had wanted to take less medications, so trying the Irbesartan and stopping Lopressor and Aldactone made it that she took 1 pill instead of 3 in a one day. Patient stated that she was worried that her BP would run high as her systolic has been 123456 at times. She was worried that starting this over the weekend she wouldn't know what to do if that happened. I informed her that we have an on call provider avail when the office is closed. I also informed her that she could take one extra dose of her Lopressor 25 MG if her SBP was over 170. I also advised that she take her BP at least 1-2 hours after taking her morning medications. Patient agreed to try the recommended plan. She stated that she still had a bunch of each medication, and would let us know when she needs a refill. Patient has a follow up on 05/06/21. Sent instructions to patients MyChart.  Dr. Thereasa Solo Recommendations:  Restart previous BP medications as prescribed.  Stop irbesartan and Carvedilol.  Start Lopressor 25 mg BID , start Aldactone 25 MG once a day.  Continue amlodipine.    Patients MyChart message:  Yesterday evening at 7 pm I started 150 MG of Irbesartan. My BP  was 131/61, HR 69. At 8:30 BP was 152/76, HR 71. At 2:40 this morning I awakened with my heart racing and my face and neck burning. They were 173/88 and 93. That is a record high HR for me. It is usually below 70. I took another 150 MG of Irbesartan. What is the maximum Irbesartan I can take in 24 hours? What else should I be doing if they stay high?

## 2021-03-25 NOTE — Telephone Encounter (Signed)
Patient called wanting a follow up on message sent on her mychart sent. Please assist

## 2021-03-26 ENCOUNTER — Encounter: Payer: Self-pay | Admitting: Family Medicine

## 2021-03-28 ENCOUNTER — Encounter: Payer: Self-pay | Admitting: Cardiology

## 2021-03-28 ENCOUNTER — Other Ambulatory Visit: Payer: Self-pay

## 2021-03-28 ENCOUNTER — Ambulatory Visit (INDEPENDENT_AMBULATORY_CARE_PROVIDER_SITE_OTHER): Payer: Medicare HMO | Admitting: Cardiology

## 2021-03-28 VITALS — BP 120/70 | HR 57 | Ht 65.0 in | Wt 129.2 lb

## 2021-03-28 DIAGNOSIS — I8393 Asymptomatic varicose veins of bilateral lower extremities: Secondary | ICD-10-CM | POA: Diagnosis not present

## 2021-03-28 DIAGNOSIS — I10 Essential (primary) hypertension: Secondary | ICD-10-CM

## 2021-03-28 MED ORDER — METOPROLOL TARTRATE 25 MG PO TABS: 25.0000 mg | ORAL_TABLET | Freq: Two times a day (BID) | ORAL | 3 refills | Status: DC

## 2021-03-28 NOTE — Progress Notes (Signed)
Cardiology Office Note:    Date:  03/28/2021   ID:  ELWANDA MONCION, DOB 08-13-1941, MRN [id number]  PCP:  Abner Greenspan, MD  [facility] Cardiologist:  Kate Sable, MD  [place] Electrophysiologist:  None   Referring MD: Abner Greenspan, MD   Chief Complaint  Patient presents with   Other    6 wk f/u c/o elevated BP. Meds reviewed verbally with pt.    History of Present Illness:    Sarah Harper is a 80 y.o. female with a hx of hypertension, degenerative joint disease, GERD, CVA, venous insufficiency who presents for follow-up, previously seen due to elevated blood pressure.    Medication adjustments were made after last visit.  She wanted to take less medications, as such hyper [place] was started, Lopressor and Aldactone was stopped.  She complains of having elevated blood pressure with medication changes.  Her previous medications including Aldactone and Lopressor were restarted.  Norvasc was continued.  Checks of blood pressures at home, readings around 160s.  Sometimes checks her blood pressure when she has neck stiffness and feeling flushed.  Also has occasional fast heartbeats when she wakes up from a dream.   Prior notes echocardiogram on 01/2015 due to syncope which showed normal systolic function, EF 60 to 123456, normal diastolic function.  Past Medical History:  Diagnosis Date   DDD (degenerative disc disease)    chronic back pain   ETD (eustachian tube dysfunction)    GERD (gastroesophageal reflux disease)    IBS (irritable bowel syndrome)    Meniere's disease    Migraine    still gets visual aura from time to time   Osteoporosis    Sleep apnea    CPAP   Syncopal episodes    after work-up - possible seizures?   Thyroid disease    hypothyroid   TIA (transient ischemic attack) 11/09/2020    Past Surgical History:  Procedure Laterality Date   APPENDECTOMY     CATARACT EXTRACTION W/PHACO Right 05/21/2019   Procedure: CATARACT EXTRACTION PHACO  AND INTRAOCULAR LENS PLACEMENT ([place]) RIGHT panoptix lens  00:35.0  21.7%  7.61;  Surgeon: Leandrew Koyanagi, MD;  Location: [place];  Service: Ophthalmology;  Laterality: Right;  sleep apnea requests early   CATARACT EXTRACTION W/PHACO Left 06/11/2019   Procedure: CATARACT EXTRACTION PHACO AND INTRAOCULAR LENS PLACEMENT ([facility]) LEFT PANOPTIX TORIC LENS  00:50.0  20.3%  10.21;  Surgeon: Leandrew Koyanagi, MD;  Location: [place];  Service: Ophthalmology;  Laterality: Left;  sleep apnea requests early   COLONOSCOPY     last 2012 - [facility]   ESOPHAGOGASTRODUODENOSCOPY     Multiple, last 01/07/2017 with Savary dilation to 18 mm   HEMORRHOID BANDING  2018   [facility]   LUMBAR DISC SURGERY  1984   L5     Current Medications: Current Meds  Medication Sig   ALPRAZolam (XANAX) 0.25 MG tablet Take 1 tablet (0.25 mg total) by mouth 2 (two) times daily as needed for anxiety (with caution of sedation).   amLODipine (NORVASC) 5 MG tablet Take 5 mg by mouth daily.   aspirin EC 81 MG EC tablet Take 1 tablet (81 mg total) by mouth daily. Swallow whole.   butalbital-acetaminophen-caffeine (FIORICET) 50-325-40 MG tablet Take 1 tablet by mouth every 6 (six) hours as needed.   Calcium Carb-Cholecalciferol (CALCIUM 600 + D PO) Take 1 tablet by mouth daily.   cholecalciferol (VITAMIN D) 25 MCG (1000 UNIT) tablet Take 1,000 Units  by mouth daily.   cyanocobalamin 1000 MCG tablet Take 1,000 mcg by mouth 3 (three) times a week.   dexlansoprazole (DEXILANT) 60 MG capsule Take 1 capsule (60 mg total) by mouth daily.   diclofenac sodium (VOLTAREN) 1 % GEL APPLY 2 GRAMS TOPICALLY FOUR TIMES A DAY TO AFFECTED AREAS   estradiol (ESTRACE) 0.1 MG/GM vaginal cream Insert small amount (1 cm) in vagina every other day   levothyroxine (SYNTHROID) 88 MCG tablet Take 1 tablet (88 mcg total) by mouth daily before breakfast.   linaclotide (LINZESS) 145 MCG CAPS capsule Take 1 capsule (145 mcg total) by mouth  daily.   metoprolol tartrate (LOPRESSOR) 25 MG tablet Take 1 tablet (25 mg total) by mouth 2 (two) times daily.   mometasone (NASONEX) 50 MCG/ACT nasal spray Place 2 sprays into the nose daily as needed.   spironolactone (ALDACTONE) 25 MG tablet Take 1 tablet (25 mg total) by mouth daily.   sucralfate (CARAFATE) 1 g tablet TAKE 1 TABLET (1 G TOTAL) BY MOUTH 4 (FOUR) TIMES DAILY - WITH MEALS AND AT BEDTIME.     Allergies:   Nsaids and Tolmetin   Social History   Socioeconomic History   Marital status: Married    Spouse name: Erland   Number of children: 0   Years of education: Not on file   Highest education level: Not on file  Occupational History   Occupation: Retired  Tobacco Use   Smoking status: Never   Smokeless tobacco: Never  Vaping Use   Vaping Use: Never used  Substance and Sexual Activity   Alcohol use: Not Currently    Comment: wine occasional   Drug use: No   Sexual activity: Not on file  Other Topics Concern   Not on file  Social History Narrative   Married, husband is a type I diabetic.  No children.   Elderly mother in her 50s lives in an assisted living facility.   Very occasional white wine with dinner, never smoker no drug use.  Limited caffeine.   Social Determinants of Health   Financial Resource Strain: Not on file  Food Insecurity: Not on file  Transportation Needs: Not on file  Physical Activity: Not on file  Stress: Not on file  Social Connections: Not on file     Family History: The patient's family history includes Breast cancer in her paternal aunt; Diabetes in her paternal aunt; Hypertension in her maternal grandfather and maternal grandmother.  ROS:   Please see the history of present illness.     All other systems reviewed and are negative.  EKGs/Labs/Other Studies Reviewed:    The following studies were reviewed today:   EKG:  EKG is  ordered today.  The ekg ordered today demonstrates sinus bradycardia.  Recent  Labs: 11/10/2020: Magnesium 1.8 03/04/2021: ALT 16; BUN 15; Creatinine, Ser 0.69; Hemoglobin 12.8; Platelets 275.0; Potassium 4.6; Sodium 135; TSH 2.36  Recent Lipid Panel    Component Value Date/Time   CHOL 189 03/04/2021 0746   CHOL 119 12/08/2020 1542   TRIG 58.0 03/04/2021 0746   HDL 68.10 03/04/2021 0746   HDL 74 12/08/2020 1542   CHOLHDL 3 03/04/2021 0746   VLDL 11.6 03/04/2021 0746   LDLCALC 109 (H) 03/04/2021 0746   LDLCALC 33 12/08/2020 1542   LDLDIRECT 115.5 02/12/2013 0755    Physical Exam:    VS:  BP 120/70 (BP Location: Left Arm, Patient Position: Sitting, Cuff Size: Normal)   Pulse (!) 57   Ht  $'5\' 5"'V$  (1.651 m)   Wt 129 lb 4 oz (58.6 kg)   SpO2 99%   BMI 21.51 kg/m     Wt Readings from Last 3 Encounters:  03/28/21 129 lb 4 oz (58.6 kg)  03/21/21 129 lb (58.5 kg)  03/11/21 130 lb 2 oz (59 kg)     GEN:  Well nourished, well developed in no acute distress HEENT: Normal NECK: No JVD; No carotid bruits LYMPHATICS: No lymphadenopathy CARDIAC: RRR, no murmur RESPIRATORY:  Clear to auscultation without rales, wheezing or rhonchi  ABDOMEN: Soft, non-tender, non-distended MUSCULOSKELETAL: no edema; bilateral varicose veins noted SKIN: Warm and dry NEUROLOGIC:  Alert and oriented x 3 PSYCHIATRIC:  Normal affect   ASSESSMENT:    1. Primary hypertension   2. Varicose veins of both lower extremities, unspecified whether complicated     PLAN:    In order of problems listed above:  Hypertension, BP controlled.  Continue Lopressor, Aldactone, Norvasc.  BP difference likely due to blood pressure cuff at home, or patient checking BP when agitated.  Blood pressures controlled today. Varicose veins, leg raising, compression stocking advised  Follow-up in 6 months   Medication Adjustments/Labs and Tests Ordered: Current medicines are reviewed at length with the patient today.  Concerns regarding medicines are outlined above.  Orders Placed This Encounter   Procedures   EKG 12-Lead     No orders of the defined types were placed in this encounter.    Patient Instructions  Medication Instructions:  - Your physician recommends that you continue on your current medications as directed. Please refer to the Current Medication list given to you today.  *If you need a refill on your cardiac medications before your next appointment, please call your pharmacy*   Lab Work: - none ordered  If you have labs (blood work) drawn today and your tests are completely normal, you will receive your results only by: [place] (if you have MyChart) OR A paper copy in the mail If you have any lab test that is abnormal or we need to change your treatment, we will call you to review the results.   Testing/Procedures: - none ordered   Follow-Up: At [facility], you and your health needs are our priority.  As part of our continuing mission to provide you with exceptional heart care, we have created designated Provider Care Teams.  These Care Teams include your primary Cardiologist (physician) and Advanced Practice Providers (APPs -  Physician Assistants and Nurse Practitioners) who all work together to provide you with the care you need, when you need it.  We recommend signing up for the patient portal called "MyChart".  Sign up information is provided on this After Visit Summary.  MyChart is used to connect with patients for Virtual Visits (Telemedicine).  Patients are able to view lab/test results, encounter notes, upcoming appointments, etc.  Non-urgent messages can be sent to your provider as well.   To learn more about what you can do with MyChart, go to [email or web address].    Your next appointment:   6 month(s)  The format for your next appointment:   In Person  Provider:   You may see Kate Sable, MD or one of the following Advanced Practice Providers on your designated Care Team:   Murray Hodgkins, NP Christell Faith,  PA-C Marrianne Mood, PA-C Cadence [place], [place]   Other Instructions N/a   Signed, Kate Sable, MD  03/28/2021 4:40 PM    [place]

## 2021-03-28 NOTE — Telephone Encounter (Signed)
Will route to PCP for review since cardiology filled med last not PCP, CPE was on 03/11/21

## 2021-03-28 NOTE — Patient Instructions (Addendum)
Medication Instructions:  - Your physician recommends that you continue on your current medications as directed. Please refer to the Current Medication list given to you today.  *If you need a refill on your cardiac medications before your next appointment, please call your pharmacy*   Lab Work: - none ordered  If you have labs (blood work) drawn today and your tests are completely normal, you will receive your results only by: [place] (if you have MyChart) OR A paper copy in the mail If you have any lab test that is abnormal or we need to change your treatment, we will call you to review the results.   Testing/Procedures: - none ordered   Follow-Up: At [facility], you and your health needs are our priority.  As part of our continuing mission to provide you with exceptional heart care, we have created designated Provider Care Teams.  These Care Teams include your primary Cardiologist (physician) and Advanced Practice Providers (APPs -  Physician Assistants and Nurse Practitioners) who all work together to provide you with the care you need, when you need it.  We recommend signing up for the patient portal called "MyChart".  Sign up information is provided on this After Visit Summary.  MyChart is used to connect with patients for Virtual Visits (Telemedicine).  Patients are able to view lab/test results, encounter notes, upcoming appointments, etc.  Non-urgent messages can be sent to your provider as well.   To learn more about what you can do with MyChart, go to [email or web address].    Your next appointment:   6 month(s)  The format for your next appointment:   In Person  Provider:   You may see Kate Sable, MD or one of the following Advanced Practice Providers on your designated Care Team:   Murray Hodgkins, NP Christell Faith, PA-C Marrianne Mood, PA-C Cadence Kathlen [place], [place]   Other Instructions N/a

## 2021-04-02 ENCOUNTER — Encounter: Payer: Self-pay | Admitting: Family Medicine

## 2021-04-04 ENCOUNTER — Encounter: Payer: Self-pay | Admitting: Family Medicine

## 2021-04-04 MED ORDER — METOPROLOL TARTRATE 25 MG PO TABS: 25.0000 mg | ORAL_TABLET | Freq: Two times a day (BID) | ORAL | 3 refills | Status: DC

## 2021-04-04 NOTE — Telephone Encounter (Signed)
Form was placed on your desk, ?? If pt is suppose to be on this med because it looks like last cardiology note she was suppose to stop med, please review

## 2021-04-05 NOTE — Telephone Encounter (Signed)
Form placed back in your inbox, PCP has to sign form and also check if pt needs to be on both listed meds or just one, please review

## 2021-04-10 LAB — COLOGUARD: Cologuard: NEGATIVE

## 2021-04-11 ENCOUNTER — Other Ambulatory Visit: Payer: Self-pay | Admitting: Internal Medicine

## 2021-04-12 ENCOUNTER — Other Ambulatory Visit: Payer: Self-pay

## 2021-04-12 ENCOUNTER — Other Ambulatory Visit (INDEPENDENT_AMBULATORY_CARE_PROVIDER_SITE_OTHER): Payer: Medicare HMO

## 2021-04-12 DIAGNOSIS — R748 Abnormal levels of other serum enzymes: Secondary | ICD-10-CM | POA: Diagnosis not present

## 2021-04-15 LAB — ALKALINE PHOSPHATASE, ISOENZYMES
Alkaline Phosphatase: 101 IU/L (ref 44–121)
BONE FRACTION: 35 % (ref 14–68)
INTESTINAL FRAC.: 4 % (ref 0–18)
LIVER FRACTION: 61 % (ref 18–85)

## 2021-05-02 ENCOUNTER — Ambulatory Visit: Payer: Medicare HMO | Admitting: Physician Assistant

## 2021-05-06 ENCOUNTER — Ambulatory Visit (INDEPENDENT_AMBULATORY_CARE_PROVIDER_SITE_OTHER): Payer: Medicare HMO | Admitting: Cardiology

## 2021-05-06 ENCOUNTER — Other Ambulatory Visit: Payer: Self-pay

## 2021-05-06 ENCOUNTER — Encounter: Payer: Self-pay | Admitting: Cardiology

## 2021-05-06 ENCOUNTER — Ambulatory Visit: Payer: Medicare HMO | Admitting: Cardiology

## 2021-05-06 VITALS — BP 122/74 | HR 69 | Ht 65.0 in | Wt 128.0 lb

## 2021-05-06 DIAGNOSIS — I10 Essential (primary) hypertension: Secondary | ICD-10-CM

## 2021-05-06 DIAGNOSIS — I8393 Asymptomatic varicose veins of bilateral lower extremities: Secondary | ICD-10-CM

## 2021-05-06 NOTE — Patient Instructions (Signed)

## 2021-05-06 NOTE — Progress Notes (Signed)
Cardiology Office Note:    Date:  05/06/2021   ID:  Sarah Harper, DOB October 16, 1940, MRN [id number]  PCP:  Abner Greenspan, MD  [facility] Cardiologist:  Kate Sable, MD  [place] Electrophysiologist:  None   Referring MD: Abner Greenspan, MD   Chief Complaint  Patient presents with   Other    Follow up to discuss medications. Meds reviewed verbally with patient.     History of Present Illness:    Sarah Harper is a 80 y.o. female with a hx of hypertension, degenerative joint disease, GERD, CVA, venous insufficiency who presents for follow-up and concerns regarding blood pressure.   She checks her blood pressures at home which are being elevated in 150s to 160s.  She was advised to bring her blood pressure cuff to the office.  Takes medications as prescribed.  Has chronic back pain, and anxiety.  Saw psychiatrist, started on Cymbalta which increase her blood pressure.  Has been taking CBD or to help.   Prior notes echocardiogram on 01/2015 due to syncope which showed normal systolic function, EF 60 to 123456, normal diastolic function. Medication adjustments were previously made.  She wanted to take less medications, as such irbesartan was started, Lopressor and Aldactone was stopped.  She complains of having elevated blood pressure with medication changes.  Her previous medications including Aldactone and Lopressor were restarted.  Norvasc was continued.  Past Medical History:  Diagnosis Date   DDD (degenerative disc disease)    chronic back pain   ETD (eustachian tube dysfunction)    GERD (gastroesophageal reflux disease)    IBS (irritable bowel syndrome)    Meniere's disease    Migraine    still gets visual aura from time to time   Osteoporosis    Sleep apnea    CPAP   Syncopal episodes    after work-up - possible seizures?   Thyroid disease    hypothyroid   TIA (transient ischemic attack) 11/09/2020    Past Surgical History:  Procedure Laterality Date    APPENDECTOMY     CATARACT EXTRACTION W/PHACO Right 05/21/2019   Procedure: CATARACT EXTRACTION PHACO AND INTRAOCULAR LENS PLACEMENT ([place]) RIGHT panoptix lens  00:35.0  21.7%  7.61;  Surgeon: Leandrew Koyanagi, MD;  Location: [place];  Service: Ophthalmology;  Laterality: Right;  sleep apnea requests early   CATARACT EXTRACTION W/PHACO Left 06/11/2019   Procedure: CATARACT EXTRACTION PHACO AND INTRAOCULAR LENS PLACEMENT ([facility]) LEFT PANOPTIX TORIC LENS  00:50.0  20.3%  10.21;  Surgeon: Leandrew Koyanagi, MD;  Location: [place];  Service: Ophthalmology;  Laterality: Left;  sleep apnea requests early   COLONOSCOPY     last 2012 - [facility]   ESOPHAGOGASTRODUODENOSCOPY     Multiple, last 01/07/2017 with Savary dilation to 18 mm   HEMORRHOID BANDING  2018   [facility]   LUMBAR DISC SURGERY  1984   L5     Current Medications: Current Meds  Medication Sig   ALPRAZolam (XANAX) 0.25 MG tablet Take 1 tablet (0.25 mg total) by mouth 2 (two) times daily as needed for anxiety (with caution of sedation).   amLODipine (NORVASC) 5 MG tablet Take 5 mg by mouth daily.   aspirin EC 81 MG EC tablet Take 1 tablet (81 mg total) by mouth daily. Swallow whole.   butalbital-acetaminophen-caffeine (FIORICET) 50-325-40 MG tablet Take 1 tablet by mouth every 6 (six) hours as needed.   Calcium Carb-Cholecalciferol (CALCIUM 600 + D PO) Take 1 tablet  by mouth daily.   cholecalciferol (VITAMIN D) 25 MCG (1000 UNIT) tablet Take 1,000 Units by mouth daily.   cyanocobalamin 1000 MCG tablet Take 1,000 mcg by mouth 3 (three) times a week.   dexlansoprazole (DEXILANT) 60 MG capsule Take 1 capsule (60 mg total) by mouth daily.   diclofenac sodium (VOLTAREN) 1 % GEL APPLY 2 GRAMS TOPICALLY FOUR TIMES A DAY TO AFFECTED AREAS   estradiol (ESTRACE) 0.1 MG/GM vaginal cream Insert small amount (1 cm) in vagina every other day   levothyroxine (SYNTHROID) 88 MCG tablet Take 1 tablet (88 mcg total) by mouth  daily before breakfast.   linaclotide (LINZESS) 145 MCG CAPS capsule Take 1 capsule (145 mcg total) by mouth daily.   metoprolol tartrate (LOPRESSOR) 25 MG tablet Take 12.5 mg by mouth 2 (two) times daily.   mometasone (NASONEX) 50 MCG/ACT nasal spray Place 2 sprays into the nose daily as needed.   spironolactone (ALDACTONE) 25 MG tablet Take 1 tablet (25 mg total) by mouth daily.   sucralfate (CARAFATE) 1 g tablet TAKE 1 TABLET (1 G TOTAL) BY MOUTH 4 (FOUR) TIMES DAILY - WITH MEALS AND AT BEDTIME.     Allergies:   Nsaids and Tolmetin   Social History   Socioeconomic History   Marital status: Married    Spouse name: Erland   Number of children: 0   Years of education: Not on file   Highest education level: Not on file  Occupational History   Occupation: Retired  Tobacco Use   Smoking status: Never   Smokeless tobacco: Never  Vaping Use   Vaping Use: Never used  Substance and Sexual Activity   Alcohol use: Not Currently    Comment: wine occasional   Drug use: No   Sexual activity: Not on file  Other Topics Concern   Not on file  Social History Narrative   Married, husband is a type I diabetic.  No children.   Elderly mother in her 47s lives in an assisted living facility.   Very occasional white wine with dinner, never smoker no drug use.  Limited caffeine.   Social Determinants of Health   Financial Resource Strain: Not on file  Food Insecurity: Not on file  Transportation Needs: Not on file  Physical Activity: Not on file  Stress: Not on file  Social Connections: Not on file     Family History: The patient's family history includes Breast cancer in her paternal aunt; Diabetes in her paternal aunt; Hypertension in her maternal grandfather and maternal grandmother.  ROS:   Please see the history of present illness.     All other systems reviewed and are negative.  EKGs/Labs/Other Studies Reviewed:    The following studies were reviewed today:   EKG:  EKG is   ordered today.  The ekg ordered today demonstrates normal sinus rhythm.  Recent Labs: 11/10/2020: Magnesium 1.8 03/04/2021: ALT 16; BUN 15; Creatinine, Ser 0.69; Hemoglobin 12.8; Platelets 275.0; Potassium 4.6; Sodium 135; TSH 2.36  Recent Lipid Panel    Component Value Date/Time   CHOL 189 03/04/2021 0746   CHOL 119 12/08/2020 1542   TRIG 58.0 03/04/2021 0746   HDL 68.10 03/04/2021 0746   HDL 74 12/08/2020 1542   CHOLHDL 3 03/04/2021 0746   VLDL 11.6 03/04/2021 0746   LDLCALC 109 (H) 03/04/2021 0746   LDLCALC 33 12/08/2020 1542   LDLDIRECT 115.5 02/12/2013 0755    Physical Exam:    VS:  BP 122/74 (BP Location: Left Arm,  Patient Position: Sitting, Cuff Size: Normal)   Pulse 69   Ht '5\' 5"'$  (1.651 m)   Wt 128 lb (58.1 kg)   SpO2 97%   BMI 21.30 kg/m     Wt Readings from Last 3 Encounters:  05/06/21 128 lb (58.1 kg)  03/28/21 129 lb 4 oz (58.6 kg)  03/21/21 129 lb (58.5 kg)     GEN:  Well nourished, well developed in no acute distress HEENT: Normal NECK: No JVD; No carotid bruits LYMPHATICS: No lymphadenopathy CARDIAC: RRR, no murmur RESPIRATORY:  Clear to auscultation without rales, wheezing or rhonchi  ABDOMEN: Soft, non-tender, non-distended MUSCULOSKELETAL: no edema; bilateral varicose veins noted SKIN: Warm and dry NEUROLOGIC:  Alert and oriented x 3 PSYCHIATRIC:  Normal affect   ASSESSMENT:    1. Primary hypertension   2. Varicose veins of both lower extremities, unspecified whether complicated      PLAN:    In order of problems listed above:  Hypertension, BP controlled.  Continue Lopressor, Aldactone, Norvasc.  BP difference is due to blood pressure cuff at home showing higher readings.  Home BP cuff used to check blood pressure with systolics of Q000111Q.  Manual pressure was normal at 122.  Patient was given a new blood pressure cuff in the office, blood pressure checked with a new BP machine showing readings of Q000111Q systolic.  She was advised to use new  blood pressure machine.  Anxiety possibly also contributing to patient's elevated BP at home. Varicose veins, leg raising, compression stocking advised  Follow-up in 12 months  Total encounter time 40 minutes  Greater than 50% was spent in counseling and coordination of care with the patient  Medication Adjustments/Labs and Tests Ordered: Current medicines are reviewed at length with the patient today.  Concerns regarding medicines are outlined above.  Orders Placed This Encounter  Procedures   EKG 12-Lead      No orders of the defined types were placed in this encounter.    Patient Instructions  Medication Instructions:  Your physician recommends that you continue on your current medications as directed. Please refer to the Current Medication list given to you today.  *If you need a refill on your cardiac medications before your next appointment, please call your pharmacy*   Lab Work: None ordered If you have labs (blood work) drawn today and your tests are completely normal, you will receive your results only by: [place] (if you have MyChart) OR A paper copy in the mail If you have any lab test that is abnormal or we need to change your treatment, we will call you to review the results.   Testing/Procedures: None ordered   Follow-Up: At [facility], you and your health needs are our priority.  As part of our continuing mission to provide you with exceptional heart care, we have created designated Provider Care Teams.  These Care Teams include your primary Cardiologist (physician) and Advanced Practice Providers (APPs -  Physician Assistants and Nurse Practitioners) who all work together to provide you with the care you need, when you need it.  We recommend signing up for the patient portal called "MyChart".  Sign up information is provided on this After Visit Summary.  MyChart is used to connect with patients for Virtual Visits (Telemedicine).  Patients are  able to view lab/test results, encounter notes, upcoming appointments, etc.  Non-urgent messages can be sent to your provider as well.   To learn more about what you can do with MyChart,  go to [email or web address].    Your next appointment:   1 year(s)  The format for your next appointment:   In Person  Provider:   Kate Sable, MD   Other Instructions    Signed, Kate Sable, MD  05/06/2021 9:02 AM    [place]

## 2021-05-15 ENCOUNTER — Other Ambulatory Visit: Payer: Self-pay | Admitting: Family Medicine

## 2021-05-18 NOTE — Telephone Encounter (Signed)
Pt requiring about her prescription.

## 2021-05-18 NOTE — Telephone Encounter (Signed)
Name of Medication: xanax Name of Pharmacy: [facility] or Written Date and Quantity: 03/09/20 #30 tabs/0 refills Last Office Visit and Type: CPE 03/11/21 Next Office Visit and Type: none scheduled

## 2021-05-24 ENCOUNTER — Ambulatory Visit (INDEPENDENT_AMBULATORY_CARE_PROVIDER_SITE_OTHER): Payer: Medicare HMO | Admitting: Internal Medicine

## 2021-05-24 ENCOUNTER — Encounter: Payer: Self-pay | Admitting: Internal Medicine

## 2021-05-24 VITALS — BP 130/70 | HR 64 | Ht 64.25 in | Wt 128.0 lb

## 2021-05-24 DIAGNOSIS — K5909 Other constipation: Secondary | ICD-10-CM

## 2021-05-24 DIAGNOSIS — R12 Heartburn: Secondary | ICD-10-CM

## 2021-05-24 NOTE — Progress Notes (Signed)
Sarah Harper 80 y.o. 1940/10/20 [id number]  Assessment & Plan:   Encounter Diagnoses  Name Primary?   Chronic constipation Yes   Heartburn     Continue current regimen.  As far as interactions between sucralfate and CBD oil I looked that up with her.  I think this is highly speculative, 3 to 5% of sucralfate is absorbed and I suspect there was an association with a case of pancreatitis and CBD oil and I do not think it is likely there is a significant interaction or great risk here.  She will continue to use these as she is improving on all fronts.  Return to see me every other year at a minimum sooner as needed.   I appreciate the opportunity to care for this patient. CC: Tower, [name], MD  Subjective:   Chief Complaint: Heartburn and constipation  HPI 57, almost 80 year old woman here for follow-up of heartburn symptoms and chronic constipation.  Last seen in May and also a June MyChart interaction.  She has been using Linzess over the years for constipation and that has generally been successful she went through a period where she did not need it so much so she backed off but is getting back to using it more often.  Sucralfate is used for heartburn symptoms with success probably up to twice a day she takes it.  If she takes it more than that it aggravates her constipation.  She has been through a lot with a stroke which has attributed to some mild cognitive decline per her neurologist and she is working on computations (fifth grade math) listening to music and using the calm app for that and anxiety issues.  She saw a psychiatrist, Dr. Jake Michaelis, they tried 3 different anxiety medications but they all raised her blood pressure so now she is taking 3 drops of CBD oil a day.  That seems to be working well i.e. the nonpharmacologic approaches and the CBD oil.  She read online that CBD oil and sucralfate may cause pancreatitis when taken together so she is concerned about that.  Mom  ws 23 recently and they celebrated.  Still living in assisted living facility in [place].   Allergies  Allergen Reactions   Nsaids     GI discomfort and gastritis   Tolmetin Other (See Comments)    GI discomfort and gastritis   Current Meds  Medication Sig   ALPRAZolam (XANAX) 0.25 MG tablet TAKE 1 TABLET BY MOUTH TWICE A DAY AS NEEDED FOR ANXIETY (CAUSES SEDATION)   AMBULATORY NON FORMULARY MEDICATION CBD drops 25 mg oral three times daily   amLODipine (NORVASC) 5 MG tablet Take 5 mg by mouth daily.   aspirin EC 81 MG EC tablet Take 1 tablet (81 mg total) by mouth daily. Swallow whole.   B Complex-C (SUPER B COMPLEX PO) Take 1 tablet by mouth daily.   butalbital-acetaminophen-caffeine (FIORICET) 50-325-40 MG tablet Take 1 tablet by mouth every 6 (six) hours as needed.   Calcium Carb-Cholecalciferol (CALCIUM 600 + D PO) Take 1 tablet by mouth daily.   calcium elemental as carbonate (BARIATRIC TUMS ULTRA) 400 MG chewable tablet Chew 1,000 mg by mouth daily.   cholecalciferol (VITAMIN D) 25 MCG (1000 UNIT) tablet Take 1,000 Units by mouth daily.   dexlansoprazole (DEXILANT) 60 MG capsule Take 1 capsule (60 mg total) by mouth daily.   diclofenac sodium (VOLTAREN) 1 % GEL APPLY 2 GRAMS TOPICALLY FOUR TIMES A DAY TO AFFECTED AREAS  estradiol (ESTRACE) 0.1 MG/GM vaginal cream Insert small amount (1 cm) in vagina every other day   levothyroxine (SYNTHROID) 88 MCG tablet Take 1 tablet (88 mcg total) by mouth daily before breakfast.   linaclotide (LINZESS) 145 MCG CAPS capsule Take 1 capsule (145 mcg total) by mouth daily.   metoprolol tartrate (LOPRESSOR) 25 MG tablet Take 12.5 mg by mouth 2 (two) times daily.   mometasone (NASONEX) 50 MCG/ACT nasal spray Place 2 sprays into the nose daily as needed.   spironolactone (ALDACTONE) 25 MG tablet Take 1 tablet (25 mg total) by mouth daily.   sucralfate (CARAFATE) 1 g tablet TAKE 1 TABLET (1 G TOTAL) BY MOUTH 4 (FOUR) TIMES DAILY - WITH MEALS AND  AT BEDTIME. (Patient taking differently: Take 1 g by mouth as needed.)   Past Medical History:  Diagnosis Date   DDD (degenerative disc disease)    chronic back pain   ETD (eustachian tube dysfunction)    GERD (gastroesophageal reflux disease)    IBS (irritable bowel syndrome)    Meniere's disease    Migraine    still gets visual aura from time to time   Osteoporosis    Sleep apnea    CPAP   Syncopal episodes    after work-up - possible seizures?   Thyroid disease    hypothyroid   TIA (transient ischemic attack) 11/09/2020   Past Surgical History:  Procedure Laterality Date   APPENDECTOMY     CATARACT EXTRACTION W/PHACO Right 05/21/2019   Procedure: CATARACT EXTRACTION PHACO AND INTRAOCULAR LENS PLACEMENT ([place]) RIGHT panoptix lens  00:35.0  21.7%  7.61;  Surgeon: Leandrew Koyanagi, MD;  Location: [facility];  Service: Ophthalmology;  Laterality: Right;  sleep apnea requests early   CATARACT EXTRACTION W/PHACO Left 06/11/2019   Procedure: CATARACT EXTRACTION PHACO AND INTRAOCULAR LENS PLACEMENT ([facility]) LEFT PANOPTIX TORIC LENS  00:50.0  20.3%  10.21;  Surgeon: Leandrew Koyanagi, MD;  Location: [facility];  Service: Ophthalmology;  Laterality: Left;  sleep apnea requests early   COLONOSCOPY     last 2012 - [facility]   ESOPHAGOGASTRODUODENOSCOPY     Multiple, last 01/07/2017 with Savary dilation to 18 mm   HEMORRHOID BANDING  2018   [facility]   LUMBAR DISC SURGERY  1984   L5    Social History   Social History Narrative   Married, husband is a type I diabetic.  No children.   Elderly mother in her 65s lives in an assisted living facility.   Very occasional white wine with dinner, never smoker no drug use.  Limited caffeine.   family history includes Breast cancer in her paternal aunt; Diabetes in her paternal aunt; Hypertension in her maternal grandfather and maternal grandmother.   Review of Systems As above  Objective:   Physical Exam BP 130/70 (BP  Location: Left Arm, Patient Position: Sitting, Cuff Size: Normal)   Pulse 64   Ht 5' 4.25" (1.632 m) Comment: height measured without shoes  Wt 128 lb (58.1 kg)   BMI 21.80 kg/m  Thin well-developed well-nourished elderly white woman no acute distress   Data reviewed includes previous GI notes, neurology visits since I last saw her, cardiology visit of August 2022  Neuro visit

## 2021-05-24 NOTE — Patient Instructions (Signed)
If you are age 80 or older, your body mass index should be between 23-30. Your Body mass index is 21.8 kg/m. If this is out of the aforementioned range listed, please consider follow up with your Primary Care Provider.  If you are age 54 or younger, your body mass index should be between 19-25. Your Body mass index is 21.8 kg/m. If this is out of the aformentioned range listed, please consider follow up with your Primary Care Provider.   __________________________________________________________  The [facility] providers would like to encourage you to use [facility] to communicate with providers for non-urgent requests or questions.  Due to long hold times on the telephone, sending your provider a message by [facility] may be a faster and more efficient way to get a response.  Please allow 48 business hours for a response.  Please remember that this is for non-urgent requests.   I appreciate the opportunity to care for you. Silvano Rusk, MD, [facility]

## 2021-05-27 ENCOUNTER — Ambulatory Visit (INDEPENDENT_AMBULATORY_CARE_PROVIDER_SITE_OTHER): Payer: Medicare HMO

## 2021-05-27 ENCOUNTER — Other Ambulatory Visit: Payer: Self-pay

## 2021-05-27 DIAGNOSIS — Z23 Encounter for immunization: Secondary | ICD-10-CM | POA: Diagnosis not present

## 2021-06-08 ENCOUNTER — Telehealth: Payer: Self-pay | Admitting: Family Medicine

## 2021-06-08 NOTE — Telephone Encounter (Signed)
Left VM letting pt know Dr. Tower's comments  

## 2021-06-08 NOTE — Telephone Encounter (Signed)
Pt called in wanting to know if the provider recommends her to take the second covid booster

## 2021-06-08 NOTE — Telephone Encounter (Signed)
I do recommend the new bivalent booster.  It needs to be at least 2 months after last booster.

## 2021-07-29 ENCOUNTER — Encounter: Payer: Self-pay | Admitting: Family Medicine

## 2021-07-29 ENCOUNTER — Ambulatory Visit (INDEPENDENT_AMBULATORY_CARE_PROVIDER_SITE_OTHER): Payer: Medicare HMO | Admitting: Family Medicine

## 2021-07-29 VITALS — BP 135/72 | HR 70

## 2021-07-29 DIAGNOSIS — F411 Generalized anxiety disorder: Secondary | ICD-10-CM | POA: Diagnosis not present

## 2021-07-29 DIAGNOSIS — E039 Hypothyroidism, unspecified: Secondary | ICD-10-CM

## 2021-07-29 NOTE — Progress Notes (Signed)
Virtual Visit via Telephone Note  I connected with Sarah Harper on 07/29/21 at  9:30 AM EST by telephone and verified that I am speaking with the correct person using two identifiers.  Location: Patient: home Provider: office    I discussed the limitations, risks, security and privacy concerns of performing an evaluation and management service by telephone and the availability of in person appointments. I also discussed with the patient that there may be a patient responsible charge related to this service. The patient expressed understanding and agreed to proceed.  Parties involved in encounter  Patient: Sarah Harper  Provider:  Loura Pardon MD   History of Present Illness: Pt presents to review/discuss medications   Wt Readings from Last 3 Encounters:  05/24/21 128 lb (58.1 kg)  05/06/21 128 lb (58.1 kg)  03/28/21 129 lb 4 oz (58.6 kg)     Had appt scheduled for early jan- cancelled due to our location   End of last march- had CVA Sees Dr Manuella Ghazi   Was on/off meds for anxiety  She took xanax for anxiety as needed (30 pills per year)  take 1/2 pill for severe anxiety  Last refill 05/18/21 for 30 pills Saw Dr Nicolasa Ducking - psychiatry  Had bad reaction to 3 different anxiety meds No longer needs to go Uses the calm app - uses this regularly  Has option to do counseling but never pursued it   Is able to work through anxiety fairly well  Uses cbd oil also (it has THC) from a reliable source and she has done research Was told it can poss interfere with thyroid   Self care is pretty good  Working hard at it     HTN bp is stable today  No cp or palpitations or headaches or edema  No side effects to medicines  BP Readings from Last 3 Encounters:  07/29/21 135/72  05/24/21 130/70  05/06/21 122/74    Metoprolol 12.5 mg bid Aldactone 12.5 mg daily (cut that from whole pill due to frequent urination) Amlodipine 5 mg daily  Pulse Readings from Last 3 Encounters:  07/29/21  70  05/24/21 64  05/06/21 69      Hypothyroidism No clinical changes at all  Lab Results  Component Value Date   TSH 2.36 03/04/2021   Free T4 was 1.37 in march  (takes biotin for her hair)  Levothyroxine 88 mcg daily   GERD Dexilant 60 mg daily  Carafate 1 g tid prn   Chronic constipation Linzess 145 mcg daily   Vaginal dryness/urinary issues  Estrace cream     Patient Active Problem List   Diagnosis Date Noted   Elevated alkaline phosphatase level 03/11/2021   Palpitations 11/10/2020   Migraine    Hypertension, essential    Acute CVA (cerebrovascular accident) ([place]) 11/08/2020   Right knee pain 09/26/2019   Headache 09/26/2019   Fall 06/02/2019   Urinary retention 05/09/2019   Syncope and collapse 12/12/2018   Colon cancer screening 02/05/2018   Degenerative joint disease (DJD) of lumbar spine 05/02/2017   Internal hemorrhoids 05/02/2017   Generalized osteoarthritis of hand 12/11/2016   GAD (generalized anxiety disorder) 12/11/2016   Estrogen deficiency 05/11/2016   Encounter for screening mammogram for breast cancer 05/11/2016   OSA (obstructive sleep apnea) 03/06/2016   Fatigue 01/25/2016   Right ankle swelling 10/18/2015   Left knee pain 10/18/2015   Snoring 10/18/2015   Routine general medical examination at a health care facility 02/28/2015  Encounter for Medicare annual wellness exam 02/06/2013   Hyperlipidemia 08/27/2012   Irregular heart beat 08/05/2012   Gynecological examination 12/20/2010   BARRETTS ESOPHAGUS 10/01/2010   Osteopenia 12/17/2009   BACK PAIN, LUMBAR 07/02/2009   IRRITABLE BOWEL SYNDROME 04/02/2009   POSTMENOPAUSAL STATUS 12/10/2008   Hypothyroidism 12/05/2007   Meniere's disease 12/05/2007   GERD 12/05/2007   Past Medical History:  Diagnosis Date   Anxiety    DDD (degenerative disc disease)    chronic back pain   ETD (eustachian tube dysfunction)    GERD (gastroesophageal reflux disease)    IBS (irritable bowel  syndrome)    Meniere's disease    Migraine    still gets visual aura from time to time   Mild cognitive impairment    Osteoporosis    Sleep apnea    CPAP   Syncopal episodes    after work-up - possible seizures?   Thyroid disease    hypothyroid   TIA (transient ischemic attack) 11/09/2020   Past Surgical History:  Procedure Laterality Date   APPENDECTOMY     CATARACT EXTRACTION W/PHACO Right 05/21/2019   Procedure: CATARACT EXTRACTION PHACO AND INTRAOCULAR LENS PLACEMENT ([place]) RIGHT panoptix lens  00:35.0  21.7%  7.61;  Surgeon: Leandrew Koyanagi, MD;  Location: [place];  Service: Ophthalmology;  Laterality: Right;  sleep apnea requests early   CATARACT EXTRACTION W/PHACO Left 06/11/2019   Procedure: CATARACT EXTRACTION PHACO AND INTRAOCULAR LENS PLACEMENT ([facility]) LEFT PANOPTIX TORIC LENS  00:50.0  20.3%  10.21;  Surgeon: Leandrew Koyanagi, MD;  Location: [facility];  Service: Ophthalmology;  Laterality: Left;  sleep apnea requests early   COLONOSCOPY     last 2012 - [facility]   ESOPHAGOGASTRODUODENOSCOPY     Multiple, last 01/07/2017 with Savary dilation to 18 mm   HEMORRHOID BANDING  2018   [facility]   LUMBAR DISC SURGERY  1984   L5    Social History   Tobacco Use   Smoking status: Never   Smokeless tobacco: Never  Vaping Use   Vaping Use: Never used  Substance Use Topics   Alcohol use: Not Currently    Comment: wine occasional   Drug use: No   Family History  Problem Relation Age of Onset   Diabetes Paternal Aunt    Breast cancer Paternal Aunt    Hypertension Maternal Grandmother    Hypertension Maternal Grandfather    Allergies  Allergen Reactions   Nsaids     GI discomfort and gastritis   Tolmetin Other (See Comments)    GI discomfort and gastritis   Current Outpatient Medications on File Prior to Visit  Medication Sig Dispense Refill   ALPRAZolam (XANAX) 0.25 MG tablet TAKE 1 TABLET BY MOUTH TWICE A DAY AS NEEDED FOR ANXIETY (CAUSES  SEDATION) 30 tablet 0   AMBULATORY NON FORMULARY MEDICATION CBD drops 25 mg oral three times daily     amLODipine (NORVASC) 5 MG tablet Take 5 mg by mouth daily.     aspirin EC 81 MG EC tablet Take 1 tablet (81 mg total) by mouth daily. Swallow whole. 30 tablet 11   B Complex-C (SUPER B COMPLEX PO) Take 1 tablet by mouth daily.     Biotin 1 MG CAPS Take by mouth.     butalbital-acetaminophen-caffeine (FIORICET) 50-325-40 MG tablet Take 1 tablet by mouth every 6 (six) hours as needed. 60 tablet 0   Calcium Carb-Cholecalciferol (CALCIUM 600 + D PO) Take 1 tablet by mouth daily.  calcium elemental as carbonate (BARIATRIC TUMS ULTRA) 400 MG chewable tablet Chew 1,000 mg by mouth daily.     cholecalciferol (VITAMIN D) 25 MCG (1000 UNIT) tablet Take 1,000 Units by mouth daily.     dexlansoprazole (DEXILANT) 60 MG capsule Take 1 capsule (60 mg total) by mouth daily. 90 capsule 3   diclofenac sodium (VOLTAREN) 1 % GEL APPLY 2 GRAMS TOPICALLY FOUR TIMES A DAY TO AFFECTED AREAS 300 g 3   estradiol (ESTRACE) 0.1 MG/GM vaginal cream Insert small amount (1 cm) in vagina every other day 42.5 g 3   levothyroxine (SYNTHROID) 88 MCG tablet Take 1 tablet (88 mcg total) by mouth daily before breakfast. 90 tablet 3   linaclotide (LINZESS) 145 MCG CAPS capsule Take 1 capsule (145 mcg total) by mouth daily. 90 capsule 3   metoprolol tartrate (LOPRESSOR) 25 MG tablet Take 12.5 mg by mouth 2 (two) times daily.     mometasone (NASONEX) 50 MCG/ACT nasal spray Place 2 sprays into the nose daily as needed.     spironolactone (ALDACTONE) 25 MG tablet Take 12.5 mg by mouth daily.     sucralfate (CARAFATE) 1 g tablet TAKE 1 TABLET (1 G TOTAL) BY MOUTH 4 (FOUR) TIMES DAILY - WITH MEALS AND AT BEDTIME. (Patient taking differently: Take 1 g by mouth as needed.) 120 tablet 1   No current facility-administered medications on file prior to visit.   Review of Systems  Constitutional:  Negative for chills, fever and  malaise/fatigue.  HENT:  Negative for congestion, ear pain, sinus pain and sore throat.   Eyes:  Negative for blurred vision, discharge and redness.  Respiratory:  Negative for cough, shortness of breath and stridor.   Cardiovascular:  Negative for chest pain, palpitations and leg swelling.  Gastrointestinal:  Negative for abdominal pain, diarrhea, nausea and vomiting.  Musculoskeletal:  Negative for myalgias.  Skin:  Negative for rash.  Neurological:  Negative for dizziness and headaches.  Psychiatric/Behavioral:  Negative for depression and suicidal ideas. The patient is nervous/anxious.    Observations/Objective: Pt sounds well In no distress  Normal voice Nl cognition /good historian (mentally sharp today) Mood is fairly good (mildly anxious)  Not tearful or depressed sounding Pt talks openly about stressors and symptoms     Assessment and Plan: Problem List Items Addressed This Visit       Endocrine   Hypothyroidism - Primary    Plan to check TSH  She takes biotin (not planning to check FT4 however)  No clinical changes Anxiety is better (she uses CBD oil)      Relevant Orders   TSH     Other   GAD (generalized anxiety disorder)    Pt has failed several medications/intolerant or inc in bp  Now using xanax for emergencies (30 or less pills a year) also cbd oil (with THC) and uses the calm app for mindfullness  Reviewed stressors/ coping techniques/symptoms/ support sources/ tx options and side effects in detail today Enc her to keep up the good self care Good self care and exercise  Saw Dr Lajoyce Corners who then signed off         Follow Up Instructions: Continue your current medicines Take xanax only when absolutely necessary with with caution of sedation and falls  If anxiety worsens let us know   I put a lab order in for the [facility] office for TSH You can call and schedule that lab visit when convenient   Take care of yourself  I discussed the  assessment and treatment plan with the patient. The patient was provided an opportunity to ask questions and all were answered. The patient agreed with the plan and demonstrated an understanding of the instructions.   The patient was advised to call back or seek an in-person evaluation if the symptoms worsen or if the condition fails to improve as anticipated.  I provided 17 minutes of non-face-to-face time during this encounter.   Loura Pardon, MD

## 2021-07-29 NOTE — Assessment & Plan Note (Signed)
Plan to check TSH  She takes biotin (not planning to check FT4 however)  No clinical changes Anxiety is better (she uses CBD oil)

## 2021-07-29 NOTE — Patient Instructions (Signed)
Continue your current medicines Take xanax only when absolutely necessary with with caution of sedation and falls  If anxiety worsens let us know   I put a lab order in for the [facility] office for TSH You can call and schedule that lab visit when convenient   Take care of yourself

## 2021-07-29 NOTE — Assessment & Plan Note (Signed)
Pt has failed several medications/intolerant or inc in bp  Now using xanax for emergencies (30 or less pills a year) also cbd oil (with THC) and uses the calm app for mindfullness  Reviewed stressors/ coping techniques/symptoms/ support sources/ tx options and side effects in detail today Enc her to keep up the good self care Good self care and exercise  Saw Dr Lajoyce Corners who then signed off

## 2021-08-03 ENCOUNTER — Other Ambulatory Visit (INDEPENDENT_AMBULATORY_CARE_PROVIDER_SITE_OTHER): Payer: Medicare HMO

## 2021-08-03 ENCOUNTER — Other Ambulatory Visit: Payer: Self-pay

## 2021-08-03 DIAGNOSIS — E039 Hypothyroidism, unspecified: Secondary | ICD-10-CM

## 2021-08-04 LAB — TSH: TSH: 0.22 u[IU]/mL — ABNORMAL LOW (ref 0.35–5.50)

## 2021-08-05 ENCOUNTER — Encounter: Payer: Self-pay | Admitting: Family Medicine

## 2021-08-05 ENCOUNTER — Telehealth: Payer: Self-pay | Admitting: Family Medicine

## 2021-08-05 NOTE — Telephone Encounter (Signed)
Relayed pharmacy info to Dr. Glori Bickers.

## 2021-08-05 NOTE — Telephone Encounter (Signed)
Pt called stating that you can send medication Synthroid RX to [facility] in [place].

## 2021-08-07 MED ORDER — LEVOTHYROXINE SODIUM 75 MCG PO TABS: 75.0000 ug | ORAL_TABLET | Freq: Every day | ORAL | 3 refills | Status: DC

## 2021-08-10 ENCOUNTER — Other Ambulatory Visit: Payer: Medicare HMO

## 2021-08-16 ENCOUNTER — Other Ambulatory Visit: Payer: Self-pay | Admitting: *Deleted

## 2021-08-16 ENCOUNTER — Telehealth: Payer: Self-pay | Admitting: Family Medicine

## 2021-08-16 DIAGNOSIS — R300 Dysuria: Secondary | ICD-10-CM

## 2021-08-16 NOTE — Telephone Encounter (Signed)
Pt called stating that she would like Dr Glori Bickers to order her lab work for a UTI. Pt states that she is at the Urgent Care in [place] but is afraid that she will get sick because people are coughing. Please advise.

## 2021-08-16 NOTE — Telephone Encounter (Signed)
I had already spoke to Amy regarding pt. She will need an OV and a urine sample. We had discussed pt dropping off urine sample at [facility] and scheduling a Virtual visit if she can't come to [place]. Will route to [place] and also Amy to f/u regarding pt but she still needs OV and urine drop off as me and Amy discussed

## 2021-08-16 NOTE — Telephone Encounter (Signed)
Dr. Glori Bickers will see the patient as a video visit on 12.28.22. Patient will come to [place] location to provide urine speciman and then will speak with Dr. Glori Bickers at 12:30  Spoke to patient and she understood.

## 2021-08-17 ENCOUNTER — Other Ambulatory Visit: Payer: Self-pay

## 2021-08-17 ENCOUNTER — Other Ambulatory Visit: Payer: Medicare HMO

## 2021-08-17 ENCOUNTER — Telehealth (INDEPENDENT_AMBULATORY_CARE_PROVIDER_SITE_OTHER): Payer: Medicare HMO | Admitting: Family Medicine

## 2021-08-17 ENCOUNTER — Encounter: Payer: Self-pay | Admitting: Family Medicine

## 2021-08-17 DIAGNOSIS — N3000 Acute cystitis without hematuria: Secondary | ICD-10-CM

## 2021-08-17 DIAGNOSIS — R300 Dysuria: Secondary | ICD-10-CM

## 2021-08-17 DIAGNOSIS — N390 Urinary tract infection, site not specified: Secondary | ICD-10-CM | POA: Insufficient documentation

## 2021-08-17 LAB — POC URINALSYSI DIPSTICK (AUTOMATED)
Bilirubin, UA: NEGATIVE
Blood, UA: POSITIVE
Glucose, UA: NEGATIVE
Ketones, UA: NEGATIVE
Nitrite, UA: NEGATIVE
Protein, UA: POSITIVE — AB
Spec Grav, UA: 1.02 (ref 1.010–1.025)
Urobilinogen, UA: NEGATIVE E.U./dL — AB
pH, UA: 5.5 (ref 5.0–8.0)

## 2021-08-17 MED ORDER — CEPHALEXIN 500 MG PO CAPS: 500.0000 mg | ORAL_CAPSULE | Freq: Two times a day (BID) | ORAL | 0 refills | Status: DC

## 2021-08-17 NOTE — Assessment & Plan Note (Signed)
Urgency and dysuria and malaise Encouraged fluids  Keflex px  Pending urine cx, will change tx if needed when that returns Discussed ER precautions  inst to hydrate and update if symptoms worsen Handout given

## 2021-08-17 NOTE — Progress Notes (Signed)
Virtual Visit via Video Note  I connected with Sarah Harper on 08/17/21 at 12:30 PM EST by a video enabled telemedicine application and verified that I am speaking with the correct person using two identifiers.  Location: Patient: home Provider: office   I discussed the limitations of evaluation and management by telemedicine and the availability of in person appointments. The patient expressed understanding and agreed to proceed.  Parties involved in encounter  Patient: Sarah Harper  Provider:  Loura Pardon MD   Video failed today so visit was done with audio only   History of Present Illness: Pt presents for urinary symptoms   It started 3-4 days ago  At end of urination felt burning (urethra and bladder) Some frequency /not severe  Urgent urination/ is sudden   No blood in urine  Not dark in color   She is drinking a lot of water   No new back or flank pain  No fever (nl temp yesterday)  No nausea   Her thinking is fuzzy also    Results for orders placed or performed in visit on 08/17/21  POCT Urinalysis Dipstick (Automated)  Result Value Ref Range   Color, UA dark yellow    Clarity, UA cloudy    Glucose, UA Negative Negative   Bilirubin, UA negative    Ketones, UA negative    Spec Grav, UA 1.020 1.010 - 1.025   Blood, UA positive    pH, UA 5.5 5.0 - 8.0   Protein, UA Positive (A) Negative   Urobilinogen, UA negative (A) 0.2 or 1.0 E.U./dL   Nitrite, UA negative    Leukocytes, UA Small (1+) (A) Negative   Small leuk and protein  Pos for blood   Lab Results  Component Value Date   CREATININE 0.69 03/04/2021   BUN 15 03/04/2021   NA 135 03/04/2021   K 4.6 03/04/2021   CL 98 03/04/2021   CO2 30 03/04/2021   Patient Active Problem List   Diagnosis Date Noted   UTI (urinary tract infection) 08/17/2021   Elevated alkaline phosphatase level 03/11/2021   Palpitations 11/10/2020   Migraine    Hypertension, essential    Acute CVA (cerebrovascular  accident) ([place]) 11/08/2020   Right knee pain 09/26/2019   Headache 09/26/2019   Fall 06/02/2019   Urinary retention 05/09/2019   Syncope and collapse 12/12/2018   Colon cancer screening 02/05/2018   Degenerative joint disease (DJD) of lumbar spine 05/02/2017   Internal hemorrhoids 05/02/2017   Generalized osteoarthritis of hand 12/11/2016   GAD (generalized anxiety disorder) 12/11/2016   Estrogen deficiency 05/11/2016   Encounter for screening mammogram for breast cancer 05/11/2016   OSA (obstructive sleep apnea) 03/06/2016   Fatigue 01/25/2016   Right ankle swelling 10/18/2015   Left knee pain 10/18/2015   Snoring 10/18/2015   Routine general medical examination at a health care facility 02/28/2015   Encounter for Medicare annual wellness exam 02/06/2013   Hyperlipidemia 08/27/2012   Irregular heart beat 08/05/2012   Gynecological examination 12/20/2010   BARRETTS ESOPHAGUS 10/01/2010   Osteopenia 12/17/2009   BACK PAIN, LUMBAR 07/02/2009   IRRITABLE BOWEL SYNDROME 04/02/2009   POSTMENOPAUSAL STATUS 12/10/2008   Hypothyroidism 12/05/2007   Meniere's disease 12/05/2007   GERD 12/05/2007   Past Medical History:  Diagnosis Date   Anxiety    DDD (degenerative disc disease)    chronic back pain   ETD (eustachian tube dysfunction)    GERD (gastroesophageal reflux disease)    IBS (irritable  bowel syndrome)    Meniere's disease    Migraine    still gets visual aura from time to time   Mild cognitive impairment    Osteoporosis    Sleep apnea    CPAP   Syncopal episodes    after work-up - possible seizures?   Thyroid disease    hypothyroid   TIA (transient ischemic attack) 11/09/2020   Past Surgical History:  Procedure Laterality Date   APPENDECTOMY     CATARACT EXTRACTION W/PHACO Right 05/21/2019   Procedure: CATARACT EXTRACTION PHACO AND INTRAOCULAR LENS PLACEMENT ([place]) RIGHT panoptix lens  00:35.0  21.7%  7.61;  Surgeon: Leandrew Koyanagi, MD;  Location: [place];  Service: Ophthalmology;  Laterality: Right;  sleep apnea requests early   CATARACT EXTRACTION W/PHACO Left 06/11/2019   Procedure: CATARACT EXTRACTION PHACO AND INTRAOCULAR LENS PLACEMENT ([facility]) LEFT PANOPTIX TORIC LENS  00:50.0  20.3%  10.21;  Surgeon: Leandrew Koyanagi, MD;  Location: [place];  Service: Ophthalmology;  Laterality: Left;  sleep apnea requests early   COLONOSCOPY     last 2012 - [facility]   ESOPHAGOGASTRODUODENOSCOPY     Multiple, last 01/07/2017 with Savary dilation to 18 mm   HEMORRHOID BANDING  2018   [facility]   LUMBAR DISC SURGERY  1984   L5    Social History   Tobacco Use   Smoking status: Never   Smokeless tobacco: Never  Vaping Use   Vaping Use: Never used  Substance Use Topics   Alcohol use: Not Currently    Comment: wine occasional   Drug use: No   Family History  Problem Relation Age of Onset   Diabetes Paternal Aunt    Breast cancer Paternal Aunt    Hypertension Maternal Grandmother    Hypertension Maternal Grandfather    Allergies  Allergen Reactions   Nsaids     GI discomfort and gastritis   Tolmetin Other (See Comments)    GI discomfort and gastritis   Current Outpatient Medications on File Prior to Visit  Medication Sig Dispense Refill   ALPRAZolam (XANAX) 0.25 MG tablet TAKE 1 TABLET BY MOUTH TWICE A DAY AS NEEDED FOR ANXIETY (CAUSES SEDATION) 30 tablet 0   AMBULATORY NON FORMULARY MEDICATION CBD drops 25 mg oral three times daily     amLODipine (NORVASC) 5 MG tablet Take 5 mg by mouth daily.     aspirin EC 81 MG EC tablet Take 1 tablet (81 mg total) by mouth daily. Swallow whole. 30 tablet 11   B Complex-C (SUPER B COMPLEX PO) Take 1 tablet by mouth daily.     Biotin 1 MG CAPS Take by mouth.     butalbital-acetaminophen-caffeine (FIORICET) 50-325-40 MG tablet Take 1 tablet by mouth every 6 (six) hours as needed. 60 tablet 0   calcium elemental as carbonate (BARIATRIC TUMS ULTRA) 400 MG chewable tablet Chew  1,000 mg by mouth daily.     cholecalciferol (VITAMIN D) 25 MCG (1000 UNIT) tablet Take 1,000 Units by mouth daily.     dexlansoprazole (DEXILANT) 60 MG capsule Take 1 capsule (60 mg total) by mouth daily. 90 capsule 3   diclofenac sodium (VOLTAREN) 1 % GEL APPLY 2 GRAMS TOPICALLY FOUR TIMES A DAY TO AFFECTED AREAS 300 g 3   estradiol (ESTRACE) 0.1 MG/GM vaginal cream Insert small amount (1 cm) in vagina every other day 42.5 g 3   levothyroxine (SYNTHROID) 75 MCG tablet Take 1 tablet (75 mcg total) by mouth daily. 90 tablet 3  linaclotide (LINZESS) 145 MCG CAPS capsule Take 1 capsule (145 mcg total) by mouth daily. (Patient taking differently: Take 145 mcg by mouth 3 (three) times a week.) 90 capsule 3   metoprolol tartrate (LOPRESSOR) 25 MG tablet Take 12.5 mg by mouth 2 (two) times daily.     mometasone (NASONEX) 50 MCG/ACT nasal spray Place 2 sprays into the nose daily as needed.     spironolactone (ALDACTONE) 25 MG tablet Take 12.5 mg by mouth daily.     sucralfate (CARAFATE) 1 g tablet TAKE 1 TABLET (1 G TOTAL) BY MOUTH 4 (FOUR) TIMES DAILY - WITH MEALS AND AT BEDTIME. (Patient taking differently: Take 1 g by mouth as needed.) 120 tablet 1   No current facility-administered medications on file prior to visit.   Review of Systems  Constitutional:  Positive for malaise/fatigue. Negative for chills and fever.  HENT:  Negative for congestion, ear pain, sinus pain and sore throat.   Eyes:  Negative for blurred vision, discharge and redness.  Respiratory:  Negative for cough, shortness of breath and stridor.   Cardiovascular:  Negative for chest pain, palpitations and leg swelling.  Gastrointestinal:  Negative for abdominal pain, diarrhea, nausea and vomiting.  Genitourinary:  Positive for dysuria, frequency and urgency. Negative for flank pain and hematuria.  Musculoskeletal:  Negative for myalgias.  Skin:  Negative for rash.  Neurological:  Negative for dizziness and headaches.      Observations/Objective: Pt sounds well , like her normal self  Not distressed  Not hoarse Nl cognition/good historian Nl mood Talkative and cheerful    Assessment and Plan: Problem List Items Addressed This Visit       Genitourinary   UTI (urinary tract infection)    Urgency and dysuria and malaise Encouraged fluids  Keflex px  Pending urine cx, will change tx if needed when that returns Discussed ER precautions  inst to hydrate and update if symptoms worsen Handout given      Relevant Medications   cephALEXin (KEFLEX) 500 MG capsule   Other Visit Diagnoses     Dysuria            Follow Up Instructions:   Drink fluids and rest  Take keflex as directed If symptoms suddenly worsen let us know (go to ER if emergent)  We will contact you with culture result when it returns and advise further at that time  Take care of yourself  I discussed the assessment and treatment plan with the patient. The patient was provided an opportunity to ask questions and all were answered. The patient agreed with the plan and demonstrated an understanding of the instructions.   The patient was advised to call back or seek an in-person evaluation if the symptoms worsen or if the condition fails to improve as anticipated.  I provided 16 minutes of non-face-to-face time during this encounter.   Loura Pardon, MD

## 2021-08-17 NOTE — Patient Instructions (Signed)
Drink fluids and rest  Take keflex as directed If symptoms suddenly worsen let us know (go to ER if emergent)  We will contact you with culture result when it returns and advise further at that time  Take care of yourself

## 2021-08-17 NOTE — Addendum Note (Signed)
Addended by: Leeanne Rio on: 08/17/2021 09:11 AM   Modules accepted: Orders

## 2021-08-18 ENCOUNTER — Telehealth: Payer: Medicare HMO | Admitting: Family Medicine

## 2021-08-19 LAB — URINE CULTURE
MICRO NUMBER:: 12804261
SPECIMEN QUALITY:: ADEQUATE

## 2021-08-23 ENCOUNTER — Ambulatory Visit: Payer: Medicare HMO | Admitting: Family Medicine

## 2021-08-24 ENCOUNTER — Encounter: Payer: Self-pay | Admitting: Family Medicine

## 2021-09-19 ENCOUNTER — Encounter: Payer: Self-pay | Admitting: Family Medicine

## 2021-09-19 MED ORDER — ESTRADIOL 0.1 MG/GM VA CREA: TOPICAL_CREAM | VAGINAL | 0 refills | Status: DC

## 2021-09-19 NOTE — Telephone Encounter (Signed)
Last filled on 03/09/20 #42.5 g with 3 refills, last OV was 08/17/20 for UTI

## 2021-09-22 ENCOUNTER — Other Ambulatory Visit (INDEPENDENT_AMBULATORY_CARE_PROVIDER_SITE_OTHER): Payer: Medicare HMO

## 2021-09-22 ENCOUNTER — Other Ambulatory Visit: Payer: Self-pay

## 2021-09-22 ENCOUNTER — Telehealth: Payer: Self-pay | Admitting: Family Medicine

## 2021-09-22 DIAGNOSIS — E039 Hypothyroidism, unspecified: Secondary | ICD-10-CM

## 2021-09-22 NOTE — Telephone Encounter (Signed)
Lab orders for thyroid

## 2021-09-23 ENCOUNTER — Encounter: Payer: Self-pay | Admitting: Family Medicine

## 2021-09-23 LAB — TSH: TSH: 8.07 u[IU]/mL — ABNORMAL HIGH (ref 0.35–5.50)

## 2021-09-26 ENCOUNTER — Other Ambulatory Visit: Payer: Self-pay

## 2021-09-26 ENCOUNTER — Ambulatory Visit: Payer: Medicare HMO | Admitting: Cardiology

## 2021-09-26 ENCOUNTER — Encounter: Payer: Self-pay | Admitting: Cardiology

## 2021-09-26 VITALS — BP 140/80 | HR 58 | Ht 67.0 in | Wt 128.0 lb

## 2021-09-26 DIAGNOSIS — I10 Essential (primary) hypertension: Secondary | ICD-10-CM

## 2021-09-26 DIAGNOSIS — I8393 Asymptomatic varicose veins of bilateral lower extremities: Secondary | ICD-10-CM | POA: Diagnosis not present

## 2021-09-26 NOTE — Patient Instructions (Signed)
Medication Instructions:  °Your physician recommends that you continue on your current medications as directed. Please refer to the Current Medication list given to you today. ° °*If you need a refill on your cardiac medications before your next appointment, please call your pharmacy* ° ° °Lab Work: °None ordered °If you have labs (blood work) drawn today and your tests are completely normal, you will receive your results only by: °MyChart Message (if you have MyChart) OR °A paper copy in the mail °If you have any lab test that is abnormal or we need to change your treatment, we will call you to review the results. ° ° °Testing/Procedures: °None ordered ° ° °Follow-Up: °At [facility], you and your health needs are our priority.  As part of our continuing mission to provide you with exceptional heart care, we have created designated Provider Care Teams.  These Care Teams include your primary Cardiologist (physician) and Advanced Practice Providers (APPs -  Physician Assistants and Nurse Practitioners) who all work together to provide you with the care you need, when you need it. ° °We recommend signing up for the patient portal called "MyChart".  Sign up information is provided on this After Visit Summary.  MyChart is used to connect with patients for Virtual Visits (Telemedicine).  Patients are able to view lab/test results, encounter notes, upcoming appointments, etc.  Non-urgent messages can be sent to your provider as well.   °To learn more about what you can do with MyChart, go to [email or web address].   ° °Your next appointment:   °1 year(s) ° °The format for your next appointment:   °In Person ° °Provider:   °You may see Brian Agbor-Etang, MD or one of the following Advanced Practice Providers on your designated Care Team:   °Christopher Berge, NP °Ryan Dunn, PA-C °Cadence Furth, PA-C  ° ° °Other Instructions ° ° °

## 2021-09-26 NOTE — Progress Notes (Signed)
Cardiology Office Note:    Date:  09/26/2021   ID:  RMONI KEPLINGER, DOB 24-Feb-1941, MRN [id number]  PCP:  Abner Greenspan, MD  [facility] Cardiologist:  Kate Sable, MD  [place] Electrophysiologist:  None   Referring MD: Abner Greenspan, MD   Chief Complaint  Patient presents with   Other    6 month follow up -- Patient c.o within the last week BP has started going up. Meds reviewed verbally with patient.     History of Present Illness:    Sarah Harper is a 81 y.o. female with a hx of hypertension, degenerative joint disease, GERD, CVA, venous insufficiency who presents for follow-up and concerns regarding blood pressure.   Patient being seen for elevated blood pressure.  States her blood pressures have been elevated over the past month or so.  Readings of blood pressures ranged anywhere from low 100s to 150.  Denies chest pain or shortness of breath, takes medications as prescribed.   Prior notes echocardiogram on 01/2015 due to syncope which showed normal systolic function, EF 60 to 93%, normal diastolic function. Medication adjustments were previously made.  She wanted to take less medications, as such irbesartan was started, Lopressor and Aldactone was stopped.  She complains of having elevated blood pressure with medication changes.  Her previous medications including Aldactone and Lopressor were restarted.  Norvasc was continued.  Past Medical History:  Diagnosis Date   Anxiety    DDD (degenerative disc disease)    chronic back pain   ETD (eustachian tube dysfunction)    GERD (gastroesophageal reflux disease)    IBS (irritable bowel syndrome)    Meniere's disease    Migraine    still gets visual aura from time to time   Mild cognitive impairment    Osteoporosis    Sleep apnea    CPAP   Syncopal episodes    after work-up - possible seizures?   Thyroid disease    hypothyroid   TIA (transient ischemic attack) 11/09/2020    Past Surgical History:   Procedure Laterality Date   APPENDECTOMY     CATARACT EXTRACTION W/PHACO Right 05/21/2019   Procedure: CATARACT EXTRACTION PHACO AND INTRAOCULAR LENS PLACEMENT ([place]) RIGHT panoptix lens  00:35.0  21.7%  7.61;  Surgeon: Leandrew Koyanagi, MD;  Location: [place];  Service: Ophthalmology;  Laterality: Right;  sleep apnea requests early   CATARACT EXTRACTION W/PHACO Left 06/11/2019   Procedure: CATARACT EXTRACTION PHACO AND INTRAOCULAR LENS PLACEMENT ([facility]) LEFT PANOPTIX TORIC LENS  00:50.0  20.3%  10.21;  Surgeon: Leandrew Koyanagi, MD;  Location: [place];  Service: Ophthalmology;  Laterality: Left;  sleep apnea requests early   COLONOSCOPY     last 2012 - [facility]   ESOPHAGOGASTRODUODENOSCOPY     Multiple, last 01/07/2017 with Savary dilation to 18 mm   HEMORRHOID BANDING  2018   [facility]   LUMBAR DISC SURGERY  1984   L5     Current Medications: Current Meds  Medication Sig   ALPRAZolam (XANAX) 0.25 MG tablet TAKE 1 TABLET BY MOUTH TWICE A DAY AS NEEDED FOR ANXIETY (CAUSES SEDATION)   AMBULATORY NON FORMULARY MEDICATION CBD drops 25 mg oral three times daily   amLODipine (NORVASC) 5 MG tablet Take 5 mg by mouth daily.   aspirin EC 81 MG EC tablet Take 1 tablet (81 mg total) by mouth daily. Swallow whole.   B Complex-C (SUPER B COMPLEX PO) Take 1 tablet by mouth daily.  Biotin 1 MG CAPS Take by mouth.   butalbital-acetaminophen-caffeine (FIORICET) 50-325-40 MG tablet Take 1 tablet by mouth every 6 (six) hours as needed.   calcium elemental as carbonate (BARIATRIC TUMS ULTRA) 400 MG chewable tablet Chew 1,000 mg by mouth daily.   cholecalciferol (VITAMIN D) 25 MCG (1000 UNIT) tablet Take 1,000 Units by mouth daily.   dexlansoprazole (DEXILANT) 60 MG capsule Take 1 capsule (60 mg total) by mouth daily.   diclofenac sodium (VOLTAREN) 1 % GEL APPLY 2 GRAMS TOPICALLY FOUR TIMES A DAY TO AFFECTED AREAS   estradiol (ESTRACE) 0.1 MG/GM vaginal cream Insert small amount  (1 cm) in vagina every other day   levothyroxine (SYNTHROID) 75 MCG tablet Take 1 tablet (75 mcg total) by mouth daily.   linaclotide (LINZESS) 145 MCG CAPS capsule Take 1 capsule (145 mcg total) by mouth daily.   metoprolol tartrate (LOPRESSOR) 25 MG tablet Take 12.5 mg by mouth 2 (two) times daily.   mometasone (NASONEX) 50 MCG/ACT nasal spray Place 2 sprays into the nose daily as needed.   spironolactone (ALDACTONE) 25 MG tablet Take 12.5 mg by mouth daily.   sucralfate (CARAFATE) 1 g tablet TAKE 1 TABLET (1 G TOTAL) BY MOUTH 4 (FOUR) TIMES DAILY - WITH MEALS AND AT BEDTIME.     Allergies:   Nsaids and Tolmetin   Social History   Socioeconomic History   Marital status: Married    Spouse name: Erland   Number of children: 0   Years of education: Not on file   Highest education level: Not on file  Occupational History   Occupation: Retired  Tobacco Use   Smoking status: Never   Smokeless tobacco: Never  Vaping Use   Vaping Use: Never used  Substance and Sexual Activity   Alcohol use: Not Currently    Comment: wine occasional   Drug use: No   Sexual activity: Not on file  Other Topics Concern   Not on file  Social History Narrative   Married, husband is a type I diabetic.  No children.   Elderly mother in her 39s lives in an assisted living facility.   Very occasional white wine with dinner, never smoker no drug use.  Limited caffeine.   Social Determinants of Health   Financial Resource Strain: Not on file  Food Insecurity: Not on file  Transportation Needs: Not on file  Physical Activity: Not on file  Stress: Not on file  Social Connections: Not on file     Family History: The patient's family history includes Breast cancer in her paternal aunt; Diabetes in her paternal aunt; Hypertension in her maternal grandfather and maternal grandmother.  ROS:   Please see the history of present illness.     All other systems reviewed and are negative.  EKGs/Labs/Other  Studies Reviewed:    The following studies were reviewed today:   EKG:  EKG is  ordered today.  The ekg ordered today demonstrates normal sinus rhythm.  Recent Labs: 11/10/2020: Magnesium 1.8 03/04/2021: ALT 16; BUN 15; Creatinine, Ser 0.69; Hemoglobin 12.8; Platelets 275.0; Potassium 4.6; Sodium 135 09/22/2021: TSH 8.07  Recent Lipid Panel    Component Value Date/Time   CHOL 189 03/04/2021 0746   CHOL 119 12/08/2020 1542   TRIG 58.0 03/04/2021 0746   HDL 68.10 03/04/2021 0746   HDL 74 12/08/2020 1542   CHOLHDL 3 03/04/2021 0746   VLDL 11.6 03/04/2021 0746   LDLCALC 109 (H) 03/04/2021 0746   LDLCALC 33 12/08/2020 1542  LDLDIRECT 115.5 02/12/2013 0755    Physical Exam:    VS:  BP 140/80 (BP Location: Left Arm, Patient Position: Sitting, Cuff Size: Normal)    Pulse (!) 58    Ht 5\' 7"  (1.702 m)    Wt 128 lb (58.1 kg)    SpO2 97%    BMI 20.05 kg/m     Wt Readings from Last 3 Encounters:  09/26/21 128 lb (58.1 kg)  05/24/21 128 lb (58.1 kg)  05/06/21 128 lb (58.1 kg)     GEN:  Well nourished, well developed in no acute distress HEENT: Normal NECK: No JVD; No carotid bruits LYMPHATICS: No lymphadenopathy CARDIAC: RRR, no murmur RESPIRATORY:  Clear to auscultation without rales, wheezing or rhonchi  ABDOMEN: Soft, non-tender, non-distended MUSCULOSKELETAL: no edema; bilateral varicose veins noted SKIN: Warm and dry NEUROLOGIC:  Alert and oriented x 3 PSYCHIATRIC:  Normal affect   ASSESSMENT:    1. Primary hypertension   2. Varicose veins of both lower extremities, unspecified whether complicated     PLAN:    In order of problems listed above:  Hypertension, slightly elevated today, usually controlled.  Home blood pressure log evaluated, average blood pressures in the 120s to 130s which I think is reasonable for patient's age.  No need to increase patient's medication doses.  Component of anxiety likely contributing to elevated BPs.  Continue Lopressor, Aldactone,  Norvasc.   Varicose veins, leg raising, compression stocking advised  Follow-up yearly or as needed  Total encounter time 30 minutes  Greater than 50% was spent in counseling and coordination of care with the patient  Medication Adjustments/Labs and Tests Ordered: Current medicines are reviewed at length with the patient today.  Concerns regarding medicines are outlined above.  Orders Placed This Encounter  Procedures   EKG 12-Lead      No orders of the defined types were placed in this encounter.     Patient Instructions  Medication Instructions:   Your physician recommends that you continue on your current medications as directed. Please refer to the Current Medication list given to you today.   *If you need a refill on your cardiac medications before your next appointment, please call your pharmacy*   Lab Work: None ordered If you have labs (blood work) drawn today and your tests are completely normal, you will receive your results only by: [place] (if you have MyChart) OR A paper copy in the mail If you have any lab test that is abnormal or we need to change your treatment, we will call you to review the results.   Testing/Procedures: None ordered   Follow-Up: At [facility], you and your health needs are our priority.  As part of our continuing mission to provide you with exceptional heart care, we have created designated Provider Care Teams.  These Care Teams include your primary Cardiologist (physician) and Advanced Practice Providers (APPs -  Physician Assistants and Nurse Practitioners) who all work together to provide you with the care you need, when you need it.  We recommend signing up for the patient portal called "MyChart".  Sign up information is provided on this After Visit Summary.  MyChart is used to connect with patients for Virtual Visits (Telemedicine).  Patients are able to view lab/test results, encounter notes, upcoming appointments,  etc.  Non-urgent messages can be sent to your provider as well.   To learn more about what you can do with MyChart, go to [email or web address].    Your next appointment:  1 year(s)  The format for your next appointment:   In Person  Provider:   You may see Kate Sable, MD or one of the following Advanced Practice Providers on your designated Care Team:   Murray Hodgkins, NP Christell Faith, PA-C Cadence Kathlen Mody, [place]    Other Instructions   \ Signed, Kate Sable, MD  09/26/2021 11:52 AM    [place]

## 2021-09-30 ENCOUNTER — Ambulatory Visit (INDEPENDENT_AMBULATORY_CARE_PROVIDER_SITE_OTHER): Payer: Medicare HMO | Admitting: Family Medicine

## 2021-09-30 ENCOUNTER — Encounter: Payer: Self-pay | Admitting: Family Medicine

## 2021-09-30 ENCOUNTER — Other Ambulatory Visit: Payer: Self-pay

## 2021-09-30 VITALS — BP 126/78 | HR 61 | Temp 98.0°F | Ht 64.25 in | Wt 129.4 lb

## 2021-09-30 DIAGNOSIS — E039 Hypothyroidism, unspecified: Secondary | ICD-10-CM | POA: Diagnosis not present

## 2021-09-30 DIAGNOSIS — I10 Essential (primary) hypertension: Secondary | ICD-10-CM

## 2021-09-30 NOTE — Progress Notes (Signed)
Subjective:    Patient ID: [name], female    DOB: December 18, 1940, 81 y.o.   MRN: [id number]  This visit occurred during the SARS-CoV-2 public health emergency.  Safety protocols were in place, including screening questions prior to the visit, additional usage of staff PPE, and extensive cleaning of exam room while observing appropriate contact time as indicated for disinfecting solutions.   HPI Pt presents for hypothyroidism, HTN   Wt Readings from Last 3 Encounters:  09/30/21 129 lb 6 oz (58.7 kg)  09/26/21 128 lb (58.1 kg)  05/24/21 128 lb (58.1 kg)   22.03 kg/m  Lab Results  Component Value Date   TSH 8.07 (H) 09/22/2021   Pt realized after that she cut her dose from 75 to 44 mcg of levothyroxine   Now she is back 75 mcg   Feels jumpy  Feels tired all the time   She takes it first thing in am before breakfast with dexilant and linzess   Usually gets up to urinate at 4 am (now takes the levothyroxine then all by itself)    Uses CBD oil also   35 mg at 7am and 7 pm  These really help   No caffeine  Drinks herbal tea  Occ decaf coffee  Drinks lots of water   Stress level is much better   Uses biotin   Had carafate for prn   From cardiology on 2/6 Hypertension, slightly elevated today, usually controlled.  Home blood pressure log evaluated, average blood pressures in the 120s to 130s which I think is reasonable for patient's age.  No need to increase patient's medication doses.  Component of anxiety likely contributing to elevated BPs.  Continue Lopressor, Aldactone, Norvasc.    BP Readings from Last 3 Encounters:  09/30/21 126/78  09/26/21 140/80  08/17/21 116/71   Pulse Readings from Last 3 Encounters:  09/30/21 61  09/26/21 (!) 58  08/17/21 65   Feels good about her BP  One teens at home when relaxed   Takes 1/2 xanax at most 1 time monthly   Patient Active Problem List   Diagnosis Date Noted   UTI (urinary tract infection) 08/17/2021    Elevated alkaline phosphatase level 03/11/2021   Palpitations 11/10/2020   Migraine    Hypertension, essential    Acute CVA (cerebrovascular accident) ([place]) 11/08/2020   Right knee pain 09/26/2019   Headache 09/26/2019   Fall 06/02/2019   Urinary retention 05/09/2019   Syncope and collapse 12/12/2018   Colon cancer screening 02/05/2018   Degenerative joint disease (DJD) of lumbar spine 05/02/2017   Internal hemorrhoids 05/02/2017   Generalized osteoarthritis of hand 12/11/2016   GAD (generalized anxiety disorder) 12/11/2016   Estrogen deficiency 05/11/2016   Encounter for screening mammogram for breast cancer 05/11/2016   OSA (obstructive sleep apnea) 03/06/2016   Fatigue 01/25/2016   Right ankle swelling 10/18/2015   Left knee pain 10/18/2015   Snoring 10/18/2015   Routine general medical examination at a health care facility 02/28/2015   Encounter for Medicare annual wellness exam 02/06/2013   Hyperlipidemia 08/27/2012   Irregular heart beat 08/05/2012   Gynecological examination 12/20/2010   BARRETTS ESOPHAGUS 10/01/2010   Osteopenia 12/17/2009   BACK PAIN, LUMBAR 07/02/2009   IRRITABLE BOWEL SYNDROME 04/02/2009   POSTMENOPAUSAL STATUS 12/10/2008   Hypothyroidism 12/05/2007   Meniere's disease 12/05/2007   GERD 12/05/2007   Past Medical History:  Diagnosis Date   Anxiety    DDD (degenerative disc disease)  chronic back pain   ETD (eustachian tube dysfunction)    GERD (gastroesophageal reflux disease)    IBS (irritable bowel syndrome)    Meniere's disease    Migraine    still gets visual aura from time to time   Mild cognitive impairment    Osteoporosis    Sleep apnea    CPAP   Syncopal episodes    after work-up - possible seizures?   Thyroid disease    hypothyroid   TIA (transient ischemic attack) 11/09/2020   Past Surgical History:  Procedure Laterality Date   APPENDECTOMY     CATARACT EXTRACTION W/PHACO Right 05/21/2019   Procedure: CATARACT  EXTRACTION PHACO AND INTRAOCULAR LENS PLACEMENT ([place]) RIGHT panoptix lens  00:35.0  21.7%  7.61;  Surgeon: Leandrew Koyanagi, MD;  Location: [place];  Service: Ophthalmology;  Laterality: Right;  sleep apnea requests early   CATARACT EXTRACTION W/PHACO Left 06/11/2019   Procedure: CATARACT EXTRACTION PHACO AND INTRAOCULAR LENS PLACEMENT ([facility]) LEFT PANOPTIX TORIC LENS  00:50.0  20.3%  10.21;  Surgeon: Leandrew Koyanagi, MD;  Location: [facility];  Service: Ophthalmology;  Laterality: Left;  sleep apnea requests early   COLONOSCOPY     last 2012 - [facility]   ESOPHAGOGASTRODUODENOSCOPY     Multiple, last 01/07/2017 with Savary dilation to 18 mm   HEMORRHOID BANDING  2018   [facility]   LUMBAR DISC SURGERY  1984   L5    Social History   Tobacco Use   Smoking status: Never   Smokeless tobacco: Never  Vaping Use   Vaping Use: Never used  Substance Use Topics   Alcohol use: Not Currently    Comment: wine occasional   Drug use: No   Family History  Problem Relation Age of Onset   Diabetes Paternal Aunt    Breast cancer Paternal Aunt    Hypertension Maternal Grandmother    Hypertension Maternal Grandfather    Allergies  Allergen Reactions   Nsaids     GI discomfort and gastritis   Tolmetin Other (See Comments)    GI discomfort and gastritis   Current Outpatient Medications on File Prior to Visit  Medication Sig Dispense Refill   ALPRAZolam (XANAX) 0.25 MG tablet TAKE 1 TABLET BY MOUTH TWICE A DAY AS NEEDED FOR ANXIETY (CAUSES SEDATION) 30 tablet 0   AMBULATORY NON FORMULARY MEDICATION CBD drops 35 mg oral twice a day     amLODipine (NORVASC) 5 MG tablet Take 5 mg by mouth daily.     aspirin EC 81 MG EC tablet Take 1 tablet (81 mg total) by mouth daily. Swallow whole. 30 tablet 11   B Complex-C (SUPER B COMPLEX PO) Take 1 tablet by mouth daily.     butalbital-acetaminophen-caffeine (FIORICET) 50-325-40 MG tablet Take 1 tablet by mouth every 6 (six) hours as  needed. 60 tablet 0   calcium elemental as carbonate (BARIATRIC TUMS ULTRA) 400 MG chewable tablet Chew 1,000 mg by mouth daily.     cholecalciferol (VITAMIN D) 25 MCG (1000 UNIT) tablet Take 1,000 Units by mouth daily.     dexlansoprazole (DEXILANT) 60 MG capsule Take 1 capsule (60 mg total) by mouth daily. 90 capsule 3   diclofenac sodium (VOLTAREN) 1 % GEL APPLY 2 GRAMS TOPICALLY FOUR TIMES A DAY TO AFFECTED AREAS 300 g 3   estradiol (ESTRACE) 0.1 MG/GM vaginal cream Insert small amount (1 cm) in vagina every other day 42.5 g 0   levothyroxine (SYNTHROID) 75 MCG tablet Take 1  tablet (75 mcg total) by mouth daily. 90 tablet 3   linaclotide (LINZESS) 145 MCG CAPS capsule Take 1 capsule (145 mcg total) by mouth daily. 90 capsule 3   metoprolol tartrate (LOPRESSOR) 25 MG tablet Take 12.5 mg by mouth 2 (two) times daily.     mometasone (NASONEX) 50 MCG/ACT nasal spray Place 2 sprays into the nose daily as needed.     spironolactone (ALDACTONE) 25 MG tablet Take 12.5 mg by mouth daily.     sucralfate (CARAFATE) 1 g tablet TAKE 1 TABLET (1 G TOTAL) BY MOUTH 4 (FOUR) TIMES DAILY - WITH MEALS AND AT BEDTIME. 120 tablet 1   No current facility-administered medications on file prior to visit.    Review of Systems  Constitutional:  Positive for fatigue. Negative for activity change, appetite change, fever and unexpected weight change.  HENT:  Negative for congestion, ear pain, rhinorrhea, sinus pressure and sore throat.   Eyes:  Negative for pain, redness and visual disturbance.  Respiratory:  Negative for cough, shortness of breath and wheezing.   Cardiovascular:  Negative for chest pain and palpitations.  Gastrointestinal:  Negative for abdominal pain, blood in stool, constipation and diarrhea.  Endocrine: Negative for polydipsia and polyuria.  Genitourinary:  Negative for dysuria, frequency and urgency.  Musculoskeletal:  Negative for arthralgias, back pain and myalgias.  Skin:  Negative for  pallor and rash.  Allergic/Immunologic: Negative for environmental allergies.  Neurological:  Negative for dizziness, syncope and headaches.  Hematological:  Negative for adenopathy. Does not bruise/bleed easily.  Psychiatric/Behavioral:  Negative for decreased concentration and dysphoric mood. The patient is nervous/anxious.        Has felt jumpy/anxious Cbd oil helps      Objective:   Physical Exam Constitutional:      General: She is not in acute distress.    Appearance: Normal appearance. She is normal weight. She is not ill-appearing or diaphoretic.  Eyes:     General:        Right eye: No discharge.        Left eye: No discharge.     Conjunctiva/sclera: Conjunctivae normal.     Pupils: Pupils are equal, round, and reactive to light.  Neck:     Vascular: No carotid bruit.     Comments: No thyroid changes  Cardiovascular:     Rate and Rhythm: Normal rate and regular rhythm.     Heart sounds: Normal heart sounds.  Pulmonary:     Effort: Pulmonary effort is normal. No respiratory distress.     Breath sounds: Normal breath sounds. No wheezing.  Musculoskeletal:     Cervical back: Normal range of motion and neck supple. No tenderness.     Right lower leg: No edema.     Left lower leg: No edema.  Lymphadenopathy:     Cervical: No cervical adenopathy.  Skin:    Coloration: Skin is not pale.     Findings: No erythema or rash.  Neurological:     Mental Status: She is alert.     Deep Tendon Reflexes: Reflexes normal.     Comments: No tremor  Psychiatric:        Mood and Affect: Mood normal.        Cognition and Memory: Cognition and memory normal.     Comments: Mood is good today          Assessment & Plan:   Problem List Items Addressed This Visit       Cardiovascular  and Mediastinum   Hypertension, essential    bp in fair control at this time  BP Readings from Last 1 Encounters:  09/30/21 126/78  No changes needed Most recent labs reviewed  Disc lifstyle  change with low sodium diet and exercise  Plan to continue  Metoprolol 12.5 mg bid  Spironolactone 12.5 mg daily  Amlodipine 5 mg daily  Pulse of 61 today  Under care of cardiology         Endocrine   Hypothyroidism - Primary    Lab Results  Component Value Date   TSH 8.07 (H) 09/22/2021  Pt took 44 mcg levothyroxine for a while (was dosed at 75) She felt jumpy but also tired Unsure if related to thyroid at all  inst to get back on 75 mcg daily  Plan to take this at 4 am when she gets up to use the bathroom and go back to sleep  Also takes biotin-this may affect thyroid numbers, adv to stop it unless she notes great benefit  Plan labs in approx 4 weeks

## 2021-09-30 NOTE — Patient Instructions (Addendum)
Take the levothyroxine at 4 am   Then when you get up take the dexilant and then wait about 30 minutes to take the linzess and then eat  Then the rest of your medicine as you usually do   Let's re check thyroid in 4 weeks   Stop biotin-this can affect thyroid   Take care of yourself and keep me posted

## 2021-10-02 NOTE — Assessment & Plan Note (Signed)
Lab Results  Component Value Date   TSH 8.07 (H) 09/22/2021   Pt took 44 mcg levothyroxine for a while (was dosed at 75) She felt jumpy but also tired Unsure if related to thyroid at all  inst to get back on 75 mcg daily  Plan to take this at 4 am when she gets up to use the bathroom and go back to sleep  Also takes biotin-this may affect thyroid numbers, adv to stop it unless she notes great benefit  Plan labs in approx 4 weeks

## 2021-10-02 NOTE — Assessment & Plan Note (Signed)
bp in fair control at this time  BP Readings from Last 1 Encounters:  09/30/21 126/78   No changes needed Most recent labs reviewed  Disc lifstyle change with low sodium diet and exercise  Plan to continue  Metoprolol 12.5 mg bid  Spironolactone 12.5 mg daily  Amlodipine 5 mg daily  Pulse of 61 today  Under care of cardiology

## 2021-10-21 ENCOUNTER — Other Ambulatory Visit: Payer: Self-pay

## 2021-10-21 ENCOUNTER — Encounter: Payer: Self-pay | Admitting: Family

## 2021-10-21 ENCOUNTER — Ambulatory Visit (INDEPENDENT_AMBULATORY_CARE_PROVIDER_SITE_OTHER): Payer: Medicare HMO | Admitting: Family

## 2021-10-21 VITALS — BP 142/80 | HR 62 | Temp 97.7°F | Wt 130.0 lb

## 2021-10-21 DIAGNOSIS — N3000 Acute cystitis without hematuria: Secondary | ICD-10-CM | POA: Diagnosis not present

## 2021-10-21 DIAGNOSIS — R300 Dysuria: Secondary | ICD-10-CM | POA: Diagnosis not present

## 2021-10-21 LAB — POC URINALSYSI DIPSTICK (AUTOMATED)
Bilirubin, UA: NEGATIVE
Blood, UA: NEGATIVE
Glucose, UA: NEGATIVE
Ketones, UA: NEGATIVE
Nitrite, UA: NEGATIVE
Protein, UA: POSITIVE — AB
Spec Grav, UA: 1.02 (ref 1.010–1.025)
Urobilinogen, UA: 0.2 E.U./dL
pH, UA: 5.5 (ref 5.0–8.0)

## 2021-10-21 MED ORDER — SULFAMETHOXAZOLE-TRIMETHOPRIM 800-160 MG PO TABS: 1.0000 | ORAL_TABLET | Freq: Two times a day (BID) | ORAL | 0 refills | Status: DC

## 2021-10-21 NOTE — Assessment & Plan Note (Signed)
Urine culture and poct dipstick in office  ?

## 2021-10-21 NOTE — Progress Notes (Signed)
Established Patient Office Visit  Subjective:  Patient ID: Sarah Harper, female    DOB: Jul 10, 1941  Age: 81 y.o. MRN: [id number]  CC:  Chief Complaint  Patient presents with   Urinary Tract Infection    Pt stated--urgency urinating, burning/discomfort--started this morning    HPI Sarah Harper is here today with concerns.   This am started with urinary frequency, urgency and dysuria.  No visualized blood in urine.  No fever no chills No chills.   Did have a UTI back in December.  Doesn't drink caffeine hardly at all. Drinks good intake of water throughout the day.   Past Medical History:  Diagnosis Date   Anxiety    DDD (degenerative disc disease)    chronic back pain   ETD (eustachian tube dysfunction)    GERD (gastroesophageal reflux disease)    IBS (irritable bowel syndrome)    Meniere's disease    Migraine    still gets visual aura from time to time   Mild cognitive impairment    Osteoporosis    Sleep apnea    CPAP   Syncopal episodes    after work-up - possible seizures?   Thyroid disease    hypothyroid   TIA (transient ischemic attack) 11/09/2020    Past Surgical History:  Procedure Laterality Date   APPENDECTOMY     CATARACT EXTRACTION W/PHACO Right 05/21/2019   Procedure: CATARACT EXTRACTION PHACO AND INTRAOCULAR LENS PLACEMENT ([place]) RIGHT panoptix lens  00:35.0  21.7%  7.61;  Surgeon: Leandrew Koyanagi, MD;  Location: [facility];  Service: Ophthalmology;  Laterality: Right;  sleep apnea requests early   CATARACT EXTRACTION W/PHACO Left 06/11/2019   Procedure: CATARACT EXTRACTION PHACO AND INTRAOCULAR LENS PLACEMENT ([facility]) LEFT PANOPTIX TORIC LENS  00:50.0  20.3%  10.21;  Surgeon: Leandrew Koyanagi, MD;  Location: [facility];  Service: Ophthalmology;  Laterality: Left;  sleep apnea requests early   COLONOSCOPY     last 2012 - [facility]   ESOPHAGOGASTRODUODENOSCOPY     Multiple, last 01/07/2017 with Savary dilation to 18 mm    HEMORRHOID BANDING  2018   [facility]   LUMBAR DISC SURGERY  1984   L5     Family History  Problem Relation Age of Onset   Diabetes Paternal Aunt    Breast cancer Paternal Aunt    Hypertension Maternal Grandmother    Hypertension Maternal Grandfather     Social History   Socioeconomic History   Marital status: Married    Spouse name: Erland   Number of children: 0   Years of education: Not on file   Highest education level: Not on file  Occupational History   Occupation: Retired  Tobacco Use   Smoking status: Never   Smokeless tobacco: Never  Vaping Use   Vaping Use: Never used  Substance and Sexual Activity   Alcohol use: Not Currently    Comment: wine occasional   Drug use: No   Sexual activity: Not on file  Other Topics Concern   Not on file  Social History Narrative   Married, husband is a type I diabetic.  No children.   Elderly mother in her 2s lives in an assisted living facility.   Very occasional white wine with dinner, never smoker no drug use.  Limited caffeine.   Social Determinants of Health   Financial Resource Strain: Not on file  Food Insecurity: Not on file  Transportation Needs: Not on file  Physical Activity: Not on file  Stress: Not on file  Social Connections: Not on file  Intimate Partner Violence: Not on file    Outpatient Medications Prior to Visit  Medication Sig Dispense Refill   ALPRAZolam (XANAX) 0.25 MG tablet TAKE 1 TABLET BY MOUTH TWICE A DAY AS NEEDED FOR ANXIETY (CAUSES SEDATION) 30 tablet 0   AMBULATORY NON FORMULARY MEDICATION CBD drops 35 mg oral twice a day     amLODipine (NORVASC) 5 MG tablet Take 5 mg by mouth daily.     aspirin EC 81 MG EC tablet Take 1 tablet (81 mg total) by mouth daily. Swallow whole. 30 tablet 11   B Complex-C (SUPER B COMPLEX PO) Take 1 tablet by mouth daily.     butalbital-acetaminophen-caffeine (FIORICET) 50-325-40 MG tablet Take 1 tablet by mouth every 6 (six) hours as needed. 60 tablet 0    calcium elemental as carbonate (BARIATRIC TUMS ULTRA) 400 MG chewable tablet Chew 1,000 mg by mouth daily.     cholecalciferol (VITAMIN D) 25 MCG (1000 UNIT) tablet Take 1,000 Units by mouth daily.     dexlansoprazole (DEXILANT) 60 MG capsule Take 1 capsule (60 mg total) by mouth daily. 90 capsule 3   diclofenac sodium (VOLTAREN) 1 % GEL APPLY 2 GRAMS TOPICALLY FOUR TIMES A DAY TO AFFECTED AREAS 300 g 3   estradiol (ESTRACE) 0.1 MG/GM vaginal cream Insert small amount (1 cm) in vagina every other day 42.5 g 0   levothyroxine (SYNTHROID) 75 MCG tablet Take 1 tablet (75 mcg total) by mouth daily. 90 tablet 3   linaclotide (LINZESS) 145 MCG CAPS capsule Take 1 capsule (145 mcg total) by mouth daily. 90 capsule 3   metoprolol tartrate (LOPRESSOR) 25 MG tablet Take 12.5 mg by mouth 2 (two) times daily.     mometasone (NASONEX) 50 MCG/ACT nasal spray Place 2 sprays into the nose daily as needed.     spironolactone (ALDACTONE) 25 MG tablet Take 12.5 mg by mouth daily.     sucralfate (CARAFATE) 1 g tablet TAKE 1 TABLET (1 G TOTAL) BY MOUTH 4 (FOUR) TIMES DAILY - WITH MEALS AND AT BEDTIME. 120 tablet 1   No facility-administered medications prior to visit.    Allergies  Allergen Reactions   Nsaids     GI discomfort and gastritis   Tolmetin Other (See Comments)    GI discomfort and gastritis    ROS Review of Systems  Constitutional:  Negative for chills and fever.  Gastrointestinal:  Negative for abdominal pain.  Genitourinary:  Positive for dysuria, frequency and urgency. Negative for difficulty urinating, flank pain, hematuria, pelvic pain and vaginal discharge.  Musculoskeletal:  Negative for arthralgias.     Objective:    Physical Exam Constitutional:      General: She is not in acute distress.    Appearance: Normal appearance. She is well-developed, well-groomed and normal weight. She is not ill-appearing.  HENT:     Nose: No congestion or rhinorrhea.     Mouth/Throat:     Pharynx:  No pharyngeal swelling, oropharyngeal exudate or posterior oropharyngeal erythema.     Tonsils: No tonsillar exudate.  Neck:     Thyroid: No thyroid mass.  Abdominal:     Tenderness: There is no abdominal tenderness.  Musculoskeletal:     Lumbar back: Normal. No tenderness.  Lymphadenopathy:     Cervical:     Right cervical: No superficial cervical adenopathy.    Left cervical: No superficial cervical adenopathy.  Neurological:     General: No  focal deficit present.     Mental Status: She is alert and oriented to person, place, and time.  Psychiatric:        Mood and Affect: Mood normal.        Behavior: Behavior normal.        Thought Content: Thought content normal.        Judgment: Judgment normal.    BP (!) 142/80    Pulse 62    Temp 97.7 F (36.5 C)    Wt 130 lb (59 kg)    SpO2 97%    BMI 22.14 kg/m  Wt Readings from Last 3 Encounters:  10/21/21 130 lb (59 kg)  09/30/21 129 lb 6 oz (58.7 kg)  09/26/21 128 lb (58.1 kg)     Health Maintenance Due  Topic Date Due   MAMMOGRAM  03/26/2021   Zoster Vaccines- Shingrix (2 of 2) 10/15/2021    There are no preventive care reminders to display for this patient.  Lab Results  Component Value Date   TSH 8.07 (H) 09/22/2021   Lab Results  Component Value Date   WBC 5.5 03/04/2021   HGB 12.8 03/04/2021   HCT 38.2 03/04/2021   MCV 88.2 03/04/2021   PLT 275.0 03/04/2021   Lab Results  Component Value Date   NA 135 03/04/2021   K 4.6 03/04/2021   CO2 30 03/04/2021   GLUCOSE 90 03/04/2021   BUN 15 03/04/2021   CREATININE 0.69 03/04/2021   BILITOT 0.6 03/04/2021   ALKPHOS 101 04/12/2021   AST 19 03/04/2021   ALT 16 03/04/2021   PROT 6.8 03/04/2021   ALBUMIN 4.5 03/04/2021   CALCIUM 9.9 03/04/2021   ANIONGAP 10 01/02/2021   EGFR 82 12/08/2020   GFR 82.36 03/04/2021   Lab Results  Component Value Date   HGBA1C 5.7 (H) 11/09/2020      Assessment & Plan:   Problem List Items Addressed This Visit        Genitourinary   UTI (urinary tract infection)    Sent antbx to pharmacy, pt to take as directed. Encouraged increased water intake throughout the day. Urine culture/reflex pending results. Choosing to treat due to being symptomatic. If no improvement in the next 2 days pt advised to let me know.       Relevant Medications   sulfamethoxazole-trimethoprim (BACTRIM DS) 800-160 MG tablet     Other   Dysuria - Primary    Urine culture and poct dipstick in office       Relevant Medications   sulfamethoxazole-trimethoprim (BACTRIM DS) 800-160 MG tablet   Other Relevant Orders   POCT Urinalysis Dipstick (Automated) (Completed)   Urine Culture    Meds ordered this encounter  Medications   sulfamethoxazole-trimethoprim (BACTRIM DS) 800-160 MG tablet    Sig: Take 1 tablet by mouth 2 (two) times daily for 3 days.    Dispense:  6 tablet    Refill:  0    Order Specific Question:   Supervising Provider    Answer:   BEDSOLE, AMY E [[id number]    Follow-up: Return if symptoms worsen or fail to improve.    Eugenia Pancoast, FNP

## 2021-10-21 NOTE — Assessment & Plan Note (Signed)
Sent antbx to pharmacy, pt to take as directed. Encouraged increased water intake throughout the day. Urine culture/reflex pending results. Choosing to treat due to being symptomatic. If no improvement in the next 2 days pt advised to let me know. ? ?

## 2021-10-21 NOTE — Patient Instructions (Signed)
It was a pleasure seeing you today.   You were found to have a urinary tract infection, you have been prescribed an antibiotic to your preferred pharmacy. Please start antibiotic today as directed.   We are sending your urine for a culture to make sure you do not have a resistant bacteria. We will call you if we need to change your medications.   Please make sure you are drinking plenty of fluids over the next few days.  If your symptoms do not improve over the next 5-7 days, or if they worsen, please let us know. Please also let us know if you have worsening back pain, fevers, chills, or body aches.   Regards,   Reford Olliff  

## 2021-10-24 ENCOUNTER — Encounter: Payer: Self-pay | Admitting: Family

## 2021-10-24 LAB — URINE CULTURE
MICRO NUMBER:: 13085375
SPECIMEN QUALITY:: ADEQUATE

## 2021-10-24 MED ORDER — SULFAMETHOXAZOLE-TRIMETHOPRIM 800-160 MG PO TABS: 1.0000 | ORAL_TABLET | Freq: Two times a day (BID) | ORAL | 0 refills | Status: AC

## 2021-10-31 ENCOUNTER — Telehealth: Payer: Self-pay | Admitting: Family Medicine

## 2021-10-31 DIAGNOSIS — E039 Hypothyroidism, unspecified: Secondary | ICD-10-CM

## 2021-10-31 NOTE — Telephone Encounter (Signed)
-----   Message from Ellamae Sia sent at 10/19/2021  3:55 PM EST ----- ?Regarding: lab orders for Tuesday, 3.14.23 ?Lab orders, tsh? ? ?

## 2021-11-01 ENCOUNTER — Other Ambulatory Visit: Payer: Medicare HMO

## 2021-11-01 ENCOUNTER — Other Ambulatory Visit (INDEPENDENT_AMBULATORY_CARE_PROVIDER_SITE_OTHER): Payer: Medicare HMO

## 2021-11-01 ENCOUNTER — Other Ambulatory Visit: Payer: Self-pay

## 2021-11-01 DIAGNOSIS — E039 Hypothyroidism, unspecified: Secondary | ICD-10-CM

## 2021-11-01 LAB — TSH: TSH: 0.91 u[IU]/mL (ref 0.35–5.50)

## 2021-11-02 ENCOUNTER — Encounter: Payer: Self-pay | Admitting: Family Medicine

## 2021-11-02 MED ORDER — LEVOTHYROXINE SODIUM 75 MCG PO TABS: 75.0000 ug | ORAL_TABLET | Freq: Every day | ORAL | 3 refills | Status: DC

## 2021-11-02 NOTE — Telephone Encounter (Signed)
Message below relayed to patient as written ? ?LMTCB  ? Abner Greenspan, MD  ?11/01/2021  7:33 PM EDT   ?  ?Your TSH is in the normal range ?Continue your current thyroid dose   ? ?

## 2021-11-09 ENCOUNTER — Ambulatory Visit
Admission: RE | Admit: 2021-11-09 | Discharge: 2021-11-09 | Disposition: A | Discharge status: Home / Self Care | Payer: Medicare HMO | Source: Ambulatory Visit | Attending: Family Medicine | Admitting: Family Medicine

## 2021-11-09 ENCOUNTER — Other Ambulatory Visit: Payer: Self-pay

## 2021-11-09 DIAGNOSIS — Z1231 Encounter for screening mammogram for malignant neoplasm of breast: Secondary | ICD-10-CM | POA: Insufficient documentation

## 2021-12-11 ENCOUNTER — Encounter: Payer: Self-pay | Admitting: Family Medicine

## 2021-12-13 MED ORDER — AMLODIPINE BESYLATE 5 MG PO TABS: 5.0000 mg | ORAL_TABLET | Freq: Every day | ORAL | 1 refills | Status: DC

## 2021-12-13 NOTE — Telephone Encounter (Signed)
Med refilled.

## 2021-12-16 ENCOUNTER — Encounter: Payer: Self-pay | Admitting: Family Medicine

## 2021-12-19 ENCOUNTER — Telehealth: Payer: Self-pay | Admitting: Family Medicine

## 2021-12-19 MED ORDER — OMEPRAZOLE 40 MG PO CPDR: 40.0000 mg | DELAYED_RELEASE_CAPSULE | Freq: Every day | ORAL | 3 refills | Status: DC

## 2021-12-19 NOTE — Telephone Encounter (Signed)
Lvm for pt to rtn my call to schedule AWV with [facility]. Please schedule if pt calls the office.  ?

## 2021-12-21 ENCOUNTER — Ambulatory Visit (INDEPENDENT_AMBULATORY_CARE_PROVIDER_SITE_OTHER): Payer: Medicare HMO

## 2021-12-21 VITALS — Wt 130.0 lb

## 2021-12-21 DIAGNOSIS — Z0000 Encounter for general adult medical examination without abnormal findings: Secondary | ICD-10-CM | POA: Diagnosis not present

## 2021-12-21 NOTE — Patient Instructions (Signed)
Ms. Duet , ?Thank you for taking time to come for your Medicare Wellness Visit. I appreciate your ongoing commitment to your health goals. Please review the following plan we discussed and let me know if I can assist you in the future.  ? ?Screening recommendations/referrals: ?Colonoscopy: No longer required ?Mammogram: Done 11/09/2021 - Repeat annually ?Bone Density: Done 07/27/2016 - Repeat every 2 years **due ?Recommended yearly ophthalmology/optometry visit for glaucoma screening and checkup ?Recommended yearly dental visit for hygiene and checkup ? ?Vaccinations: ?Influenza vaccine: Done 05/27/2021 - Repeat annually ?Pneumococcal vaccine: Done 12/22/2011 & 02/24/2014 ?Tdap vaccine: Done 12/22/2011 - Repeat in 10 years *due now ?Shingles vaccine: Done 08/20/2021 & 12/14/2021        ?Covid-19: Done  09/12/2019, 10/03/2019, 04/24/2020, & 06/17/2021 ? ?Advanced directives: Please bring a copy of your health care power of attorney and living will to the office to be added to your chart at your convenience.  ? ?Conditions/risks identified: Aim for 30 minutes of exercise or brisk walking, 6-8 glasses of water, and 5 servings of fruits and vegetables each day.  ? ?Next appointment: Follow up in one year for your annual wellness visit  ? ? ?Preventive Care 19 Years and Older, Female ?Preventive care refers to lifestyle choices and visits with your health care provider that can promote health and wellness. ?What does preventive care include? ?A yearly physical exam. This is also called an annual well check. ?Dental exams once or twice a year. ?Routine eye exams. Ask your health care provider how often you should have your eyes checked. ?Personal lifestyle choices, including: ?Daily care of your teeth and gums. ?Regular physical activity. ?Eating a healthy diet. ?Avoiding tobacco and drug use. ?Limiting alcohol use. ?Practicing safe sex. ?Taking low-dose aspirin every day. ?Taking vitamin and mineral supplements as recommended by  your health care provider. ?What happens during an annual well check? ?The services and screenings done by your health care provider during your annual well check will depend on your age, overall health, lifestyle risk factors, and family history of disease. ?Counseling  ?Your health care provider may ask you questions about your: ?Alcohol use. ?Tobacco use. ?Drug use. ?Emotional well-being. ?Home and relationship well-being. ?Sexual activity. ?Eating habits. ?History of falls. ?Memory and ability to understand (cognition). ?Work and work Statistician. ?Reproductive health. ?Screening  ?You may have the following tests or measurements: ?Height, weight, and BMI. ?Blood pressure. ?Lipid and cholesterol levels. These may be checked every 5 years, or more frequently if you are over 56 years old. ?Skin check. ?Lung cancer screening. You may have this screening every year starting at age 23 if you have a 30-pack-year history of smoking and currently smoke or have quit within the past 15 years. ?Fecal occult blood test (FOBT) of the stool. You may have this test every year starting at age 55. ?Flexible sigmoidoscopy or colonoscopy. You may have a sigmoidoscopy every 5 years or a colonoscopy every 10 years starting at age 56. ?Hepatitis C blood test. ?Hepatitis B blood test. ?Sexually transmitted disease (STD) testing. ?Diabetes screening. This is done by checking your blood sugar (glucose) after you have not eaten for a while (fasting). You may have this done every 1-3 years. ?Bone density scan. This is done to screen for osteoporosis. You may have this done starting at age 84. ?Mammogram. This may be done every 1-2 years. Talk to your health care provider about how often you should have regular mammograms. ?Talk with your health care provider about your test  results, treatment options, and if necessary, the need for more tests. ?Vaccines  ?Your health care provider may recommend certain vaccines, such as: ?Influenza  vaccine. This is recommended every year. ?Tetanus, diphtheria, and acellular pertussis (Tdap, Td) vaccine. You may need a Td booster every 10 years. ?Zoster vaccine. You may need this after age 20. ?Pneumococcal 13-valent conjugate (PCV13) vaccine. One dose is recommended after age 92. ?Pneumococcal polysaccharide (PPSV23) vaccine. One dose is recommended after age 54. ?Talk to your health care provider about which screenings and vaccines you need and how often you need them. ?This information is not intended to replace advice given to you by your health care provider. Make sure you discuss any questions you have with your health care provider. ?Document Released: 09/03/2015 Document Revised: 04/26/2016 Document Reviewed: 06/08/2015 ?Elsevier Interactive Patient Education ? 2017 [place]. ? ?Fall Prevention in the Home ?Falls can cause injuries. They can happen to people of all ages. There are many things you can do to make your home safe and to help prevent falls. ?What can I do on the outside of my home? ?Regularly fix the edges of walkways and driveways and fix any cracks. ?Remove anything that might make you trip as you walk through a door, such as a raised step or threshold. ?Trim any bushes or trees on the path to your home. ?Use bright outdoor lighting. ?Clear any walking paths of anything that might make someone trip, such as rocks or tools. ?Regularly check to see if handrails are loose or broken. Make sure that both sides of any steps have handrails. ?Any raised decks and porches should have guardrails on the edges. ?Have any leaves, snow, or ice cleared regularly. ?Use sand or salt on walking paths during winter. ?Clean up any spills in your garage right away. This includes oil or grease spills. ?What can I do in the bathroom? ?Use night lights. ?Install grab bars by the toilet and in the tub and shower. Do not use towel bars as grab bars. ?Use non-skid mats or decals in the tub or shower. ?If you  need to sit down in the shower, use a plastic, non-slip stool. ?Keep the floor dry. Clean up any water that spills on the floor as soon as it happens. ?Remove soap buildup in the tub or shower regularly. ?Attach bath mats securely with double-sided non-slip rug tape. ?Do not have throw rugs and other things on the floor that can make you trip. ?What can I do in the bedroom? ?Use night lights. ?Make sure that you have a light by your bed that is easy to reach. ?Do not use any sheets or blankets that are too big for your bed. They should not hang down onto the floor. ?Have a firm chair that has side arms. You can use this for support while you get dressed. ?Do not have throw rugs and other things on the floor that can make you trip. ?What can I do in the kitchen? ?Clean up any spills right away. ?Avoid walking on wet floors. ?Keep items that you use a lot in easy-to-reach places. ?If you need to reach something above you, use a strong step stool that has a grab bar. ?Keep electrical cords out of the way. ?Do not use floor polish or wax that makes floors slippery. If you must use wax, use non-skid floor wax. ?Do not have throw rugs and other things on the floor that can make you trip. ?What can I do with my stairs? ?  Do not leave any items on the stairs. ?Make sure that there are handrails on both sides of the stairs and use them. Fix handrails that are broken or loose. Make sure that handrails are as long as the stairways. ?Check any carpeting to make sure that it is firmly attached to the stairs. Fix any carpet that is loose or worn. ?Avoid having throw rugs at the top or bottom of the stairs. If you do have throw rugs, attach them to the floor with carpet tape. ?Make sure that you have a light switch at the top of the stairs and the bottom of the stairs. If you do not have them, ask someone to add them for you. ?What else can I do to help prevent falls? ?Wear shoes that: ?Do not have high heels. ?Have rubber  bottoms. ?Are comfortable and fit you well. ?Are closed at the toe. Do not wear sandals. ?If you use a stepladder: ?Make sure that it is fully opened. Do not climb a closed stepladder. ?Make sure that both sides of the

## 2021-12-21 NOTE — Progress Notes (Signed)
? ?Subjective:  ? Sarah Harper is a 81 y.o. female who presents for Medicare Annual (Subsequent) preventive examination. ? ?Virtual Visit via Telephone Note ? ?I connected with  Sarah Harper on 12/21/21 at 12:00 PM EDT by telephone and verified that I am speaking with the correct person using two identifiers. ? ?Location: ?Patient: Home ?Provider: [facility] [place] ?Persons participating in the virtual visit: patient/Nurse Health Advisor ?  ?I discussed the limitations, risks, security and privacy concerns of performing an evaluation and management service by telephone and the availability of in person appointments. The patient expressed understanding and agreed to proceed. ? ?Interactive audio and video telecommunications were attempted between this nurse and patient, however failed, due to patient having technical difficulties OR patient did not have access to video capability.  We continued and completed visit with audio only. ? ?Some vital signs may be absent or patient reported.  ? ?[name], LPN i ? ?Review of Systems    ? ?Cardiac Risk Factors include: advanced age (>75mn, >>58women);dyslipidemia;Other (see comment);sedentary lifestyle, Risk factor comments: OSA on CPAP, hx of stroke ? ?   ?Objective:  ?  ?Today's Vitals  ? 12/21/21 1203  ?Weight: 130 lb (59 kg)  ? ?Body mass index is 22.14 kg/m?. ? ? ?  12/21/2021  ? 12:10 PM 12/04/2020  ?  7:46 AM 11/10/2020  ?  2:54 PM 11/08/2020  ?  1:20 PM 03/19/2020  ?  2:04 PM 06/11/2019  ? 11:51 AM 05/21/2019  ?  7:32 AM  ?Advanced Directives  ?Does Patient Have a Medical Advance Directive? Yes Yes Yes Yes Yes Yes Yes  ?Type of AParamedicof [place]Living will Healthcare Power of [place]gro[place]Living will [place]Old Mill CreekLiving will HH[place]Living will HFairleaLiving will H[place]Living will  ?Does patient want to make changes to  medical advance directive?   No - Patient declined No - Patient declined  No - Patient declined No - Patient declined  ?Copy of [place] Chart? No - copy requested  No - copy requested No - copy requested No - copy requested No - copy requested Yes - validated most recent copy scanned in chart (See row information)  ?Would patient like information on creating a medical advance directive?   No - Patient declined No - Patient declined     ? ? ?Current Medications (verified) ?Outpatient Encounter Medications as of 12/21/2021  ?Medication Sig  ? ALPRAZolam (XANAX) 0.25 MG tablet TAKE 1 TABLET BY MOUTH TWICE A DAY AS NEEDED FOR ANXIETY (CAUSES SEDATION)  ? AMBULATORY NON FORMULARY MEDICATION CBD drops ?35 mg oral twice a day  ? amLODipine (NORVASC) 5 MG tablet Take 1 tablet (5 mg total) by mouth daily.  ? aspirin EC 81 MG EC tablet Take 1 tablet (81 mg total) by mouth daily. Swallow whole.  ? B Complex-C (SUPER B COMPLEX PO) Take 1 tablet by mouth daily.  ? butalbital-acetaminophen-caffeine (FIORICET) 50-325-40 MG tablet Take 1 tablet by mouth every 6 (six) hours as needed.  ? calcium elemental as carbonate (BARIATRIC TUMS ULTRA) 400 MG chewable tablet Chew 1,000 mg by mouth daily.  ? cholecalciferol (VITAMIN D) 25 MCG (1000 UNIT) tablet Take 1,000 Units by mouth daily.  ? diclofenac sodium (VOLTAREN) 1 % GEL APPLY 2 GRAMS TOPICALLY FOUR TIMES A DAY TO AFFECTED AREAS  ? estradiol (ESTRACE) 0.1 MG/GM vaginal cream Insert small amount (  1 cm) in vagina every other day  ? levothyroxine (SYNTHROID) 75 MCG tablet Take 1 tablet (75 mcg total) by mouth daily.  ? linaclotide (LINZESS) 145 MCG CAPS capsule Take 1 capsule (145 mcg total) by mouth daily.  ? metoprolol tartrate (LOPRESSOR) 25 MG tablet Take 12.5 mg by mouth 2 (two) times daily.  ? mometasone (NASONEX) 50 MCG/ACT nasal spray Place 2 sprays into the nose daily as needed.  ? omeprazole (PRILOSEC) 40 MG capsule Take 1 capsule (40 mg total) by mouth  daily.  ? spironolactone (ALDACTONE) 25 MG tablet Take 12.5 mg by mouth daily.  ? sucralfate (CARAFATE) 1 g tablet TAKE 1 TABLET (1 G TOTAL) BY MOUTH 4 (FOUR) TIMES DAILY - WITH MEALS AND AT BEDTIME.  ? ?No facility-administered encounter medications on file as of 12/21/2021.  ? ? ?Allergies (verified) ?Nsaids and Tolmetin  ? ?History: ?Past Medical History:  ?Diagnosis Date  ? Anxiety   ? Cataract 1997  ? DDD (degenerative disc disease)   ? chronic back pain  ? ETD (eustachian tube dysfunction)   ? GERD (gastroesophageal reflux disease)   ? Hypertension 2022  ? IBS (irritable bowel syndrome)   ? Meniere's disease   ? Migraine   ? still gets visual aura from time to time  ? Mild cognitive impairment   ? Osteoporosis   ? Sleep apnea   ? CPAP  ? Stroke [facility]) 2022  ? Syncopal episodes   ? after work-up - possible seizures?  ? Thyroid disease   ? hypothyroid  ? TIA (transient ischemic attack) 11/09/2020  ? ?Past Surgical History:  ?Procedure Laterality Date  ? APPENDECTOMY    ? CATARACT EXTRACTION W/PHACO Right 05/21/2019  ? Procedure: CATARACT EXTRACTION PHACO AND INTRAOCULAR LENS PLACEMENT ([facility]) RIGHT panoptix lens  00:35.0  21.7%  7.61;  Surgeon: Leandrew Koyanagi, MD;  Location: [place];  Service: Ophthalmology;  Laterality: Right;  sleep apnea ?requests early  ? CATARACT EXTRACTION W/PHACO Left 06/11/2019  ? Procedure: CATARACT EXTRACTION PHACO AND INTRAOCULAR LENS PLACEMENT ([facility]) LEFT PANOPTIX TORIC LENS  00:50.0  20.3%  10.21;  Surgeon: Leandrew Koyanagi, MD;  Location: [place];  Service: Ophthalmology;  Laterality: Left;  sleep apnea ?requests early  ? COLONOSCOPY    ? last 2012 - [facility]  ? ESOPHAGOGASTRODUODENOSCOPY    ? Multiple, last 01/07/2017 with Savary dilation to 18 mm  ? HEMORRHOID BANDING  2018  ? [facility]  ? [place] SURGERY  1984  ? L5   ? ?Family History  ?Problem Relation Age of Onset  ? Diabetes Paternal Aunt   ? Breast cancer Paternal Aunt   ? Hypertension Maternal  Grandmother   ? Hypertension Maternal Grandfather   ? ?Social History  ? ?Socioeconomic History  ? Marital status: Married  ?  Spouse name: Erland  ? Number of children: 0  ? Years of education: Not on file  ? Highest education level: Not on file  ?Occupational History  ? Occupation: Retired  ?Tobacco Use  ? Smoking status: Never  ? Smokeless tobacco: Never  ?Vaping Use  ? Vaping Use: Never used  ?Substance and Sexual Activity  ? Alcohol use: Yes  ?  Comment: Average 1 glass of wine/week  ? Drug use: No  ? Sexual activity: Yes  ?  Birth control/protection: Post-menopausal  ?Other Topics Concern  ? Not on file  ?Social History Narrative  ? Married, husband is a type I diabetic.  No children.  ? Elderly mother in  her 52s lives in an assisted living facility.  ? Very occasional white wine with dinner, never smoker no drug use.  Limited caffeine.  ? ?Social Determinants of Health  ? ?Financial Resource Strain: Low Risk   ? Difficulty of Paying Living Expenses: Not hard at all  ?Food Insecurity: No Food Insecurity  ? Worried About Charity fundraiser in the Last Year: Never true  ? Ran Out of Food in the Last Year: Never true  ?Transportation Needs: No Transportation Needs  ? Lack of Transportation (Medical): No  ? Lack of Transportation (Non-Medical): No  ?Physical Activity: Insufficiently Active  ? Days of Exercise per Week: 7 days  ? Minutes of Exercise per Session: 10 min  ?Stress: No Stress Concern Present  ? Feeling of Stress : Only a little  ?Social Connections: Moderately Integrated  ? Frequency of Communication with Friends and Family: Twice a week  ? Frequency of Social Gatherings with Friends and Family: Twice a week  ? Attends Religious Services: Never  ? Active Member of Clubs or Organizations: Yes  ? Attends Archivist Meetings: More than 4 times per year  ? Marital Status: Married  ? ? ?Tobacco Counseling ?Counseling given: Not Answered ? ? ?Clinical Intake: ? ?Pre-visit preparation completed:  Yes ? ?Pain : No/denies pain ? ?  ? ?BMI - recorded: 22.14 ?Nutritional Status: BMI of 19-24  Normal ?Nutritional Risks: None ?Diabetes: No ? ?How often do you need to have someone help you when you read instruc

## 2021-12-26 ENCOUNTER — Encounter: Payer: Self-pay | Admitting: Family Medicine

## 2021-12-26 DIAGNOSIS — E039 Hypothyroidism, unspecified: Secondary | ICD-10-CM

## 2021-12-27 NOTE — Telephone Encounter (Signed)
Lab appt scheduled on 12/29/21, please put lab orders in  ?

## 2021-12-27 NOTE — Addendum Note (Signed)
Addended by: Loura Pardon A on: 12/27/2021 08:37 PM ? ? Modules accepted: Orders ? ?

## 2021-12-29 ENCOUNTER — Other Ambulatory Visit (INDEPENDENT_AMBULATORY_CARE_PROVIDER_SITE_OTHER): Payer: Medicare HMO

## 2021-12-29 DIAGNOSIS — E039 Hypothyroidism, unspecified: Secondary | ICD-10-CM

## 2021-12-29 LAB — TSH: TSH: 0.79 u[IU]/mL (ref 0.35–5.50)

## 2022-01-12 ENCOUNTER — Other Ambulatory Visit: Payer: Medicare HMO

## 2022-01-19 ENCOUNTER — Encounter: Payer: Medicare HMO | Admitting: Family Medicine

## 2022-02-01 ENCOUNTER — Encounter: Payer: Self-pay | Admitting: Family Medicine

## 2022-02-01 ENCOUNTER — Ambulatory Visit (INDEPENDENT_AMBULATORY_CARE_PROVIDER_SITE_OTHER): Payer: Medicare HMO | Admitting: Family Medicine

## 2022-02-01 ENCOUNTER — Other Ambulatory Visit: Payer: Self-pay | Admitting: Unknown Physician Specialty

## 2022-02-01 VITALS — BP 138/80 | HR 69 | Temp 98.0°F | Resp 16 | Ht 64.25 in | Wt 129.1 lb

## 2022-02-01 DIAGNOSIS — I10 Essential (primary) hypertension: Secondary | ICD-10-CM

## 2022-02-01 DIAGNOSIS — R42 Dizziness and giddiness: Secondary | ICD-10-CM

## 2022-02-01 NOTE — Patient Instructions (Addendum)
Go forward with the carotid doppler with Dr Richardson Landry   If this is normal I think we should have you see cardiology for a follow up  Don't change position slowly and be very careful   Take care of yourself  Drink your fluids   If symptoms worsen let us know  If severe go to the ER

## 2022-02-01 NOTE — Progress Notes (Signed)
Subjective:    Patient ID: Sarah Harper, female    DOB: Apr 28, 1941, 81 y.o.   MRN: [id number]  HPI Pt presents for light headedness  Wt Readings from Last 3 Encounters:  02/01/22 129 lb 2 oz (58.6 kg)  12/21/21 130 lb (59 kg)  10/21/21 130 lb (59 kg)   21.99 kg/m  Episodes of light headedness on/off since feburary  If she does not sit down then she feels like she may pass out  Gets warm feeling in chest (no pain or palpitations)  Has to sit down  Happens when her head is bent forward   No spinning feeling   PA at neurologist talked about mega doses of vit E for vertigo Saw ENT/ [facility]- tested for vertigo/did not have it (? No crystal problems) Told it could be from carotid artery ?    She had carotid dopplers in 2016 with less than 40% narrowing  Zio moniotir 01/2021 nl  CT coronary score was in 30%ile in may of 2022 Nl echo with nl EF at that time also    No h/o lipid problems  Lab Results  Component Value Date   CHOL 189 03/04/2021   HDL 68.10 03/04/2021   LDLCALC 109 (H) 03/04/2021   LDLDIRECT 115.5 02/12/2013   TRIG 58.0 03/04/2021   CHOLHDL 3 03/04/2021    Past h/o CVA Sees Dr Manuella Ghazi for neuro Past h/o meniere's dz   Last MRI brain 10/2020   IMPRESSION: Punctate acute infarct within the high left frontal lobe.   New from the prior brain MRI of 01/17/2019, there is a small focus of extra-axial T2/FLAIR hyperintensity with corresponding restricted diffusion overlying the mid left frontal lobe. This is nonspecific but may reflect a trace extra-axial collection. Consider post-contrast MR imaging of the brain for further characterization.   Redemonstrated chronic hemosiderin deposition along the mid to posterior left frontal lobe compatible with sequela of remote subarachnoid hemorrhage.   Mild chronic small vessel ischemic disease within the cerebral white matter and pons and mild cerebral atrophy, stable as compared to the brain MRI of  01/17/2019.     Electronically Signed  HTN bp is stable today  No cp or palpitations or headaches or edema  No side effects to medicines  BP Readings from Last 3 Encounters:  02/01/22 138/80  10/21/21 (!) 142/80  09/30/21 126/78    Patient Active Problem List   Diagnosis Date Noted   Dizziness 02/02/2022   Dysuria 10/21/2021   UTI (urinary tract infection) 08/17/2021   Elevated alkaline phosphatase level 03/11/2021   Palpitations 11/10/2020   Migraine    Hypertension, essential    Acute CVA (cerebrovascular accident) ([place]) 11/08/2020   Right knee pain 09/26/2019   Headache 09/26/2019   Fall 06/02/2019   Urinary retention 05/09/2019   Syncope and collapse 12/12/2018   Colon cancer screening 02/05/2018   Degenerative joint disease (DJD) of lumbar spine 05/02/2017   Internal hemorrhoids 05/02/2017   Generalized osteoarthritis of hand 12/11/2016   GAD (generalized anxiety disorder) 12/11/2016   Estrogen deficiency 05/11/2016   Encounter for screening mammogram for breast cancer 05/11/2016   OSA (obstructive sleep apnea) 03/06/2016   Fatigue 01/25/2016   Right ankle swelling 10/18/2015   Left knee pain 10/18/2015   Snoring 10/18/2015   Routine general medical examination at a health care facility 02/28/2015   Encounter for Medicare annual wellness exam 02/06/2013   Hyperlipidemia 08/27/2012   Irregular heart beat 08/05/2012   Gynecological examination 12/20/2010  BARRETTS ESOPHAGUS 10/01/2010   Osteopenia 12/17/2009   BACK PAIN, LUMBAR 07/02/2009   IRRITABLE BOWEL SYNDROME 04/02/2009   POSTMENOPAUSAL STATUS 12/10/2008   Hypothyroidism 12/05/2007   Meniere's disease 12/05/2007   GERD 12/05/2007   Past Medical History:  Diagnosis Date   Anxiety    Cataract 1997   DDD (degenerative disc disease)    chronic back pain   ETD (eustachian tube dysfunction)    GERD (gastroesophageal reflux disease)    Hypertension 2022   IBS (irritable bowel syndrome)    Meniere's  disease    Migraine    still gets visual aura from time to time   Mild cognitive impairment    Osteoporosis    Sleep apnea    CPAP   Stroke ([place]) 2022   Syncopal episodes    after work-up - possible seizures?   Thyroid disease    hypothyroid   TIA (transient ischemic attack) 11/09/2020   Past Surgical History:  Procedure Laterality Date   APPENDECTOMY     CATARACT EXTRACTION W/PHACO Right 05/21/2019   Procedure: CATARACT EXTRACTION PHACO AND INTRAOCULAR LENS PLACEMENT ([place]) RIGHT panoptix lens  00:35.0  21.7%  7.61;  Surgeon: Leandrew Koyanagi, MD;  Location: [facility];  Service: Ophthalmology;  Laterality: Right;  sleep apnea requests early   CATARACT EXTRACTION W/PHACO Left 06/11/2019   Procedure: CATARACT EXTRACTION PHACO AND INTRAOCULAR LENS PLACEMENT ([facility]) LEFT PANOPTIX TORIC LENS  00:50.0  20.3%  10.21;  Surgeon: Leandrew Koyanagi, MD;  Location: [facility];  Service: Ophthalmology;  Laterality: Left;  sleep apnea requests early   COLONOSCOPY     last 2012 - [facility]   ESOPHAGOGASTRODUODENOSCOPY     Multiple, last 01/07/2017 with Savary dilation to 18 mm   HEMORRHOID BANDING  2018   [facility]   LUMBAR DISC SURGERY  1984   L5    Social History   Tobacco Use   Smoking status: Never   Smokeless tobacco: Never  Vaping Use   Vaping Use: Never used  Substance Use Topics   Alcohol use: Yes    Comment: Average 1 glass of wine/week   Drug use: No   Family History  Problem Relation Age of Onset   Diabetes Paternal Aunt    Breast cancer Paternal Aunt    Hypertension Maternal Grandmother    Hypertension Maternal Grandfather    Allergies  Allergen Reactions   Nsaids     GI discomfort and gastritis   Tolmetin Other (See Comments)    GI discomfort and gastritis   Current Outpatient Medications on File Prior to Visit  Medication Sig Dispense Refill   ALPRAZolam (XANAX) 0.25 MG tablet TAKE 1 TABLET BY MOUTH TWICE A DAY AS NEEDED FOR ANXIETY (CAUSES  SEDATION) 30 tablet 0   AMBULATORY NON FORMULARY MEDICATION CBD drops 35 mg oral twice a day     amLODipine (NORVASC) 5 MG tablet Take 1 tablet (5 mg total) by mouth daily. (Patient taking differently: Take 5 mg by mouth as needed.) 90 tablet 1   aspirin EC 81 MG EC tablet Take 1 tablet (81 mg total) by mouth daily. Swallow whole. 30 tablet 11   B Complex-C (SUPER B COMPLEX PO) Take 1 tablet by mouth daily.     butalbital-acetaminophen-caffeine (FIORICET) 50-325-40 MG tablet Take 1 tablet by mouth every 6 (six) hours as needed. 60 tablet 0   calcium elemental as carbonate (BARIATRIC TUMS ULTRA) 400 MG chewable tablet Chew 1,000 mg by mouth daily.  cholecalciferol (VITAMIN D) 25 MCG (1000 UNIT) tablet Take 1,000 Units by mouth daily.     diclofenac sodium (VOLTAREN) 1 % GEL APPLY 2 GRAMS TOPICALLY FOUR TIMES A DAY TO AFFECTED AREAS 300 g 3   estradiol (ESTRACE) 0.1 MG/GM vaginal cream Insert small amount (1 cm) in vagina every other day 42.5 g 0   levothyroxine (SYNTHROID) 75 MCG tablet Take 1 tablet (75 mcg total) by mouth daily. 90 tablet 3   linaclotide (LINZESS) 145 MCG CAPS capsule Take 1 capsule (145 mcg total) by mouth daily. (Patient taking differently: Take 145 mcg by mouth as needed.) 90 capsule 3   metoprolol tartrate (LOPRESSOR) 25 MG tablet Take 12.5 mg by mouth as needed.     mometasone (NASONEX) 50 MCG/ACT nasal spray Place 2 sprays into the nose daily as needed.     omeprazole (PRILOSEC) 40 MG capsule Take 1 capsule (40 mg total) by mouth daily. 90 capsule 3   spironolactone (ALDACTONE) 25 MG tablet Take 12.5 mg by mouth as needed.     sucralfate (CARAFATE) 1 g tablet TAKE 1 TABLET (1 G TOTAL) BY MOUTH 4 (FOUR) TIMES DAILY - WITH MEALS AND AT BEDTIME. 120 tablet 1   No current facility-administered medications on file prior to visit.    Review of Systems  Constitutional:  Negative for activity change, appetite change, fatigue, fever and unexpected weight change.  HENT:   Negative for congestion, ear pain, rhinorrhea, sinus pressure and sore throat.   Eyes:  Negative for pain, redness and visual disturbance.  Respiratory:  Negative for cough, shortness of breath and wheezing.   Cardiovascular:  Negative for chest pain and palpitations.  Gastrointestinal:  Negative for abdominal pain, blood in stool, constipation and diarrhea.  Endocrine: Negative for polydipsia and polyuria.  Genitourinary:  Negative for dysuria, frequency and urgency.  Musculoskeletal:  Negative for arthralgias, back pain and myalgias.  Skin:  Negative for pallor and rash.  Allergic/Immunologic: Negative for environmental allergies.  Neurological:  Positive for dizziness and light-headedness. Negative for tremors, seizures, syncope, facial asymmetry, speech difficulty, weakness, numbness and headaches.  Hematological:  Negative for adenopathy. Does not bruise/bleed easily.  Psychiatric/Behavioral:  Negative for decreased concentration and dysphoric mood. The patient is not nervous/anxious.        Objective:   Physical Exam Constitutional:      General: She is not in acute distress.    Appearance: Normal appearance. She is well-developed and normal weight. She is not ill-appearing or diaphoretic.  HENT:     Head: Normocephalic and atraumatic.     Right Ear: External ear normal.     Left Ear: External ear normal.     Nose: Nose normal.     Mouth/Throat:     Mouth: Mucous membranes are moist.     Pharynx: No oropharyngeal exudate.  Eyes:     General: No scleral icterus.       Right eye: No discharge.        Left eye: No discharge.     Extraocular Movements: Extraocular movements intact.     Conjunctiva/sclera: Conjunctivae normal.     Pupils: Pupils are equal, round, and reactive to light.     Comments: No nystagmus  Neck:     Thyroid: No thyromegaly.     Vascular: No carotid bruit or JVD.     Trachea: No tracheal deviation.  Cardiovascular:     Rate and Rhythm: Normal rate and  regular rhythm.     Heart sounds:  Normal heart sounds. No murmur heard.    No gallop.  Pulmonary:     Effort: Pulmonary effort is normal. No respiratory distress.     Breath sounds: Normal breath sounds. No wheezing or rales.  Abdominal:     General: Bowel sounds are normal. There is no distension or abdominal bruit.     Palpations: Abdomen is soft. There is no mass.     Tenderness: There is no abdominal tenderness.  Musculoskeletal:        General: No tenderness.     Cervical back: Full passive range of motion without pain, normal range of motion and neck supple.     Right lower leg: No edema.     Left lower leg: No edema.  Lymphadenopathy:     Cervical: No cervical adenopathy.  Skin:    General: Skin is warm and dry.     Coloration: Skin is not pale.     Findings: No rash.  Neurological:     Mental Status: She is alert and oriented to person, place, and time.     Cranial Nerves: Cranial nerves 2-12 are intact. No cranial nerve deficit, dysarthria or facial asymmetry.     Sensory: Sensation is intact. No sensory deficit.     Motor: Motor function is intact. No weakness, tremor, atrophy, abnormal muscle tone or pronator drift.     Coordination: Coordination is intact. Romberg sign negative. Coordination normal. Finger-Nose-Finger Test normal.     Gait: Gait is intact. Gait normal.     Deep Tendon Reflexes: Reflexes are normal and symmetric. Reflexes normal.     Comments: No focal cerebellar signs   Psychiatric:        Mood and Affect: Mood normal.        Behavior: Behavior normal.        Thought Content: Thought content normal.           Assessment & Plan:   Problem List Items Addressed This Visit       Cardiovascular and Mediastinum   Hypertension, essential    Stable in setting of some positional light headedness  No orthostasis  Nl exam        Other   Dizziness - Primary    Episodic, occurring usually with flexion of neck, presenting as light headed feeling  but not spinning  She sees ENT and neurology and vestibular issues have been ruled out (has had meniere's in past) Carotid doppler is planned from ENT- agree this is a good idea  Pt has past h/o CVA  Reviewed last specialist notes and carotid dopplers from 2016, coronary ca score, zio monitor report and last echo report  Reassuring exam today  Rev ER precautions Will stay hydrated  If nl carotid study - would recommend f/u with cardiology

## 2022-02-02 DIAGNOSIS — R42 Dizziness and giddiness: Secondary | ICD-10-CM | POA: Insufficient documentation

## 2022-02-02 NOTE — Assessment & Plan Note (Signed)
Stable in setting of some positional light headedness  No orthostasis  Nl exam

## 2022-02-02 NOTE — Assessment & Plan Note (Signed)
Episodic, occurring usually with flexion of neck, presenting as light headed feeling but not spinning  She sees ENT and neurology and vestibular issues have been ruled out (has had meniere's in past) Carotid doppler is planned from ENT- agree this is a good idea  Pt has past h/o CVA  Reviewed last specialist notes and carotid dopplers from 2016, coronary ca score, zio monitor report and last echo report  Reassuring exam today  Rev ER precautions Will stay hydrated  If nl carotid study - would recommend f/u with cardiology

## 2022-02-07 ENCOUNTER — Ambulatory Visit
Admission: RE | Admit: 2022-02-07 | Discharge: 2022-02-07 | Disposition: A | Discharge status: Home / Self Care | Payer: Medicare HMO | Source: Ambulatory Visit | Attending: Unknown Physician Specialty | Admitting: Unknown Physician Specialty

## 2022-02-07 DIAGNOSIS — R42 Dizziness and giddiness: Secondary | ICD-10-CM

## 2022-02-14 ENCOUNTER — Encounter: Payer: Self-pay | Admitting: Family Medicine

## 2022-02-15 MED ORDER — SPIRONOLACTONE 25 MG PO TABS: 12.5000 mg | ORAL_TABLET | ORAL | 0 refills | Status: DC | PRN

## 2022-02-15 NOTE — Telephone Encounter (Signed)
Pt is requesting you to take over writing this medication for her , she is said that she as an appt coming up with him in fall. However , she would like for you write since this is going be long term. I advised pt that  normally  if she is still under  the care of that provide the he is the one that [facility] write that rx  ,so if any changes needed to be made they can make them . Pt still would like for you write this for her.I told her I would put a note up to ask if this is okay

## 2022-02-15 NOTE — Telephone Encounter (Signed)
I'm ok with that - it has been the same for a while  I refilled 90 day since she will need f/u with cardiology in the fall

## 2022-02-15 NOTE — Telephone Encounter (Signed)
Okay thank you

## 2022-03-08 ENCOUNTER — Other Ambulatory Visit (INDEPENDENT_AMBULATORY_CARE_PROVIDER_SITE_OTHER): Payer: Medicare HMO

## 2022-03-08 ENCOUNTER — Telehealth (INDEPENDENT_AMBULATORY_CARE_PROVIDER_SITE_OTHER): Payer: Medicare HMO | Admitting: Family Medicine

## 2022-03-08 DIAGNOSIS — E039 Hypothyroidism, unspecified: Secondary | ICD-10-CM

## 2022-03-08 DIAGNOSIS — E7800 Pure hypercholesterolemia, unspecified: Secondary | ICD-10-CM

## 2022-03-08 DIAGNOSIS — I10 Essential (primary) hypertension: Secondary | ICD-10-CM

## 2022-03-08 LAB — CBC WITH DIFFERENTIAL/PLATELET
Basophils Absolute: 0.1 10*3/uL (ref 0.0–0.1)
Basophils Relative: 1.3 % (ref 0.0–3.0)
Eosinophils Absolute: 0.2 10*3/uL (ref 0.0–0.7)
Eosinophils Relative: 3.9 % (ref 0.0–5.0)
HCT: 38.1 % (ref 36.0–46.0)
Hemoglobin: 12.7 g/dL (ref 12.0–15.0)
Lymphocytes Relative: 32.1 % (ref 12.0–46.0)
Lymphs Abs: 2 10*3/uL (ref 0.7–4.0)
MCHC: 33.3 g/dL (ref 30.0–36.0)
MCV: 88.5 fl (ref 78.0–100.0)
Monocytes Absolute: 0.6 10*3/uL (ref 0.1–1.0)
Monocytes Relative: 9.5 % (ref 3.0–12.0)
Neutro Abs: 3.3 10*3/uL (ref 1.4–7.7)
Neutrophils Relative %: 53.2 % (ref 43.0–77.0)
Platelets: 248 10*3/uL (ref 150.0–400.0)
RBC: 4.31 Mil/uL (ref 3.87–5.11)
RDW: 13.4 % (ref 11.5–15.5)
WBC: 6.3 10*3/uL (ref 4.0–10.5)

## 2022-03-08 LAB — COMPREHENSIVE METABOLIC PANEL
ALT: 18 U/L (ref 0–35)
AST: 21 U/L (ref 0–37)
Albumin: 4.2 g/dL (ref 3.5–5.2)
Alkaline Phosphatase: 88 U/L (ref 39–117)
BUN: 11 mg/dL (ref 6–23)
CO2: 30 mEq/L (ref 19–32)
Calcium: 9.1 mg/dL (ref 8.4–10.5)
Chloride: 100 mEq/L (ref 96–112)
Creatinine, Ser: 0.69 mg/dL (ref 0.40–1.20)
GFR: 81.78 mL/min (ref >60.00)
Glucose, Bld: 88 mg/dL (ref 70–99)
Potassium: 3.9 mEq/L (ref 3.5–5.1)
Sodium: 137 mEq/L (ref 135–145)
Total Bilirubin: 0.5 mg/dL (ref 0.2–1.2)
Total Protein: 6.2 g/dL (ref 6.0–8.3)

## 2022-03-08 LAB — LIPID PANEL
Cholesterol: 181 mg/dL (ref 0–200)
HDL: 67.5 mg/dL (ref >39.00)
LDL Cholesterol: 101 mg/dL — ABNORMAL HIGH (ref 0–99)
NonHDL: 113.23
Total CHOL/HDL Ratio: 3
Triglycerides: 63 mg/dL (ref 0.0–149.0)
VLDL: 12.6 mg/dL (ref 0.0–40.0)

## 2022-03-08 LAB — TSH: TSH: 1.53 u[IU]/mL (ref 0.35–5.50)

## 2022-03-08 NOTE — Telephone Encounter (Signed)
Lab orders

## 2022-03-13 ENCOUNTER — Ambulatory Visit (INDEPENDENT_AMBULATORY_CARE_PROVIDER_SITE_OTHER): Payer: Medicare HMO | Admitting: Family Medicine

## 2022-03-13 ENCOUNTER — Encounter: Payer: Self-pay | Admitting: Family Medicine

## 2022-03-13 VITALS — BP 130/71 | HR 76 | Temp 98.0°F | Ht 64.25 in | Wt 130.5 lb

## 2022-03-13 DIAGNOSIS — Z1211 Encounter for screening for malignant neoplasm of colon: Secondary | ICD-10-CM

## 2022-03-13 DIAGNOSIS — K219 Gastro-esophageal reflux disease without esophagitis: Secondary | ICD-10-CM | POA: Diagnosis not present

## 2022-03-13 DIAGNOSIS — E039 Hypothyroidism, unspecified: Secondary | ICD-10-CM | POA: Diagnosis not present

## 2022-03-13 DIAGNOSIS — G43109 Migraine with aura, not intractable, without status migrainosus: Secondary | ICD-10-CM

## 2022-03-13 DIAGNOSIS — E7800 Pure hypercholesterolemia, unspecified: Secondary | ICD-10-CM

## 2022-03-13 DIAGNOSIS — Z0000 Encounter for general adult medical examination without abnormal findings: Secondary | ICD-10-CM | POA: Diagnosis not present

## 2022-03-13 DIAGNOSIS — I10 Essential (primary) hypertension: Secondary | ICD-10-CM | POA: Diagnosis not present

## 2022-03-13 DIAGNOSIS — M8589 Other specified disorders of bone density and structure, multiple sites: Secondary | ICD-10-CM

## 2022-03-13 DIAGNOSIS — E2839 Other primary ovarian failure: Secondary | ICD-10-CM

## 2022-03-13 MED ORDER — OMEPRAZOLE 40 MG PO CPDR: 40.0000 mg | DELAYED_RELEASE_CAPSULE | Freq: Every day | ORAL | 3 refills | Status: DC

## 2022-03-13 MED ORDER — AMLODIPINE BESYLATE 5 MG PO TABS: 5.0000 mg | ORAL_TABLET | Freq: Every day | ORAL | 3 refills | Status: DC

## 2022-03-13 MED ORDER — LINACLOTIDE 145 MCG PO CAPS
145.0000 ug | ORAL_CAPSULE | ORAL | 3 refills | Status: DC | PRN
Start: 2022-03-13 — End: 2024-01-29

## 2022-03-13 MED ORDER — SPIRONOLACTONE 25 MG PO TABS: 12.5000 mg | ORAL_TABLET | ORAL | 3 refills | Status: DC | PRN

## 2022-03-13 MED ORDER — LEVOTHYROXINE SODIUM 75 MCG PO TABS: 75.0000 ug | ORAL_TABLET | Freq: Every day | ORAL | 3 refills | Status: DC

## 2022-03-13 NOTE — Progress Notes (Signed)
Subjective:    Patient ID: Sarah Harper, female    DOB: [birth year], 81 y.o.   MRN: [id number]  HPI Here for health maintenance exam and to review chronic medical problems    Wt Readings from Last 3 Encounters:  03/13/22 130 lb 8 oz (59.2 kg)  02/01/22 129 lb 2 oz (58.6 kg)  12/21/21 130 lb (59 kg)   22.23 kg/m  Trying to reduce her CBD drops (for pain and anxiety)  Think they affect the thyroid  She uses 30 mg at bedtime   (used to use 75)  Felt better with 75 mcg   Caring for mother at 88 years old   Immunization History  Administered Date(s) Administered   Fluad Quad(high Dose 65+) 05/07/2019, 05/22/2020, 05/27/2021   Influenza Whole 05/22/2007, 05/21/2009   Influenza,inj,Quad PF,6+ Mos 06/11/2013, 05/28/2014, 05/14/2015, 05/12/2016, 05/10/2017, 05/16/2018   PFIZER(Purple Top)SARS-COV-2 Vaccination 09/12/2019, 10/03/2019, 04/24/2020   Pfizer Covid-19 Vaccine Bivalent Booster 95yr & up 06/17/2021   Pneumococcal Conjugate-13 02/24/2014   Pneumococcal Polysaccharide-23 12/22/2011   Td 12/19/2001, 12/22/2011   Zoster Recombinat (Shingrix) 08/20/2021, 12/14/2021   Zoster, Live 11/12/2012    Health Maintenance Due  Topic Date Due   DEXA SCAN  07/28/2019   TETANUS/TDAP  12/21/2021   Dexa  last 07/2016 with osteopenia   Falls: none Fractures:none  Supplements vitamin D and some tums  Exercise : not a lot lately due to her arthritis pain (knees and hands and back)   Goes to chiropractor for adjustments and uses a soft wave therapy   Mammogram 10/2021 Self breast exam: no lumps   Colon screen  Colonoscopy 09  Cologuard neg 03/2021  Mood    12/21/2021   12:08 PM 03/19/2020    2:05 PM 03/07/2019   10:21 AM 02/05/2018    3:22 PM 03/06/2016    8:10 AM  Depression screen PHQ 2/9  Decreased Interest 0 1 0 0 0  Down, Depressed, Hopeless 0 1 0 0 0  PHQ - 2 Score 0 2 0 0 0  Altered sleeping  0     Tired, decreased energy  0     Change in appetite  0     Feeling  bad or failure about yourself   0     Trouble concentrating  0     Moving slowly or fidgety/restless  0     Suicidal thoughts  0     PHQ-9 Score  2     Difficult doing work/chores  Not difficult at all         HTN bp is stable today  No cp or palpitations or headaches or edema  No side effects to medicines  BP Readings from Last 3 Encounters:  03/13/22 130/71  02/01/22 138/80  10/21/21 (!) 142/80    Bp is usually one teens/ 70 at home -very good /Cherre Hugerhigher here  Metoprolol 25 mg prn Amlodipine 2.5 mg daily  Aldactone 12.5 mg daily as needed   GERD-acting up lately  Used to take dexilant brand and then generic did not help much  Omeprazole 40 mg daily -helped for a while  Had a round of sucralfate  Barrett's esophagus followed gy GI CBD    Hypothyroidism  When she took the cbd the thyroid was off more  Pt has no clinical changes No change in energy level/ hair or skin/ edema and no tremor Lab Results  Component Value Date   TSH 1.53 03/08/2022    Levothyroxine 75  mcg daily     Hyperlipidemia Lab Results  Component Value Date   CHOL 181 03/08/2022   CHOL 189 03/04/2021   CHOL 119 12/08/2020   Lab Results  Component Value Date   HDL 67.50 03/08/2022   HDL 68.10 03/04/2021   HDL 74 12/08/2020   Lab Results  Component Value Date   LDLCALC 101 (H) 03/08/2022   LDLCALC 109 (H) 03/04/2021   LDLCALC 33 12/08/2020   Lab Results  Component Value Date   TRIG 63.0 03/08/2022   TRIG 58.0 03/04/2021   TRIG 50 12/08/2020   Lab Results  Component Value Date   CHOLHDL 3 03/08/2022   CHOLHDL 3 03/04/2021   CHOLHDL 1.6 12/08/2020   Lab Results  Component Value Date   LDLDIRECT 115.5 02/12/2013   LDLDIRECT 109.9 12/19/2010   LDLDIRECT 117.7 12/17/2009   Statin intolerant  Diet controlled  Eats heart healthy diet  Does have some ice cream    Migraine Occ fioricet   Lab Results  Component Value Date   CREATININE 0.69 03/08/2022   BUN 11  03/08/2022   NA 137 03/08/2022   K 3.9 03/08/2022   CL 100 03/08/2022   CO2 30 03/08/2022   Lab Results  Component Value Date   ALT 18 03/08/2022   AST 21 03/08/2022   ALKPHOS 88 03/08/2022   BILITOT 0.5 03/08/2022    Glucose 88 Lab Results  Component Value Date   WBC 6.3 03/08/2022   HGB 12.7 03/08/2022   HCT 38.1 03/08/2022   MCV 88.5 03/08/2022   PLT 248.0 03/08/2022   Patient Active Problem List   Diagnosis Date Noted   Elevated alkaline phosphatase level 03/11/2021   Palpitations 11/10/2020   Migraine    Hypertension, essential    H/O: CVA (cerebrovascular accident) 11/08/2020   Headache 09/26/2019   Colon cancer screening 02/05/2018   Degenerative joint disease (DJD) of lumbar spine 05/02/2017   Internal hemorrhoids 05/02/2017   Generalized osteoarthritis of hand 12/11/2016   GAD (generalized anxiety disorder) 12/11/2016   Estrogen deficiency 05/11/2016   Encounter for screening mammogram for breast cancer 05/11/2016   OSA (obstructive sleep apnea) 03/06/2016   Fatigue 01/25/2016   Left knee pain 10/18/2015   Snoring 10/18/2015   Routine general medical examination at a health care facility 02/28/2015   Encounter for Medicare annual wellness exam 02/06/2013   Hyperlipidemia 08/27/2012   Irregular heart beat 08/05/2012   Gynecological examination 12/20/2010   BARRETTS ESOPHAGUS 10/01/2010   Osteopenia 12/17/2009   BACK PAIN, LUMBAR 07/02/2009   IRRITABLE BOWEL SYNDROME 04/02/2009   POSTMENOPAUSAL STATUS 12/10/2008   Hypothyroidism 12/05/2007   Meniere's disease 12/05/2007   GERD 12/05/2007   Past Medical History:  Diagnosis Date   Anxiety    Cataract 1997   DDD (degenerative disc disease)    chronic back pain   ETD (eustachian tube dysfunction)    GERD (gastroesophageal reflux disease)    Hypertension 2022   IBS (irritable bowel syndrome)    Meniere's disease    Migraine    still gets visual aura from time to time   Mild cognitive impairment     Osteoporosis    Sleep apnea    CPAP   Stroke ([place]) 2022   Syncopal episodes    after work-up - possible seizures?   Thyroid disease    hypothyroid   TIA (transient ischemic attack) 11/09/2020   Past Surgical History:  Procedure Laterality Date   APPENDECTOMY     CATARACT  EXTRACTION W/PHACO Right 05/21/2019   Procedure: CATARACT EXTRACTION PHACO AND INTRAOCULAR LENS PLACEMENT (IOC) RIGHT panoptix lens  00:35.0  21.7%  7.61;  Surgeon: Leandrew Koyanagi, MD;  Location: [facility];  Service: Ophthalmology;  Laterality: Right;  sleep apnea requests early   CATARACT EXTRACTION W/PHACO Left 06/11/2019   Procedure: CATARACT EXTRACTION PHACO AND INTRAOCULAR LENS PLACEMENT (IOC) LEFT PANOPTIX TORIC LENS  00:50.0  20.3%  10.21;  Surgeon: Leandrew Koyanagi, MD;  Location: [facility];  Service: Ophthalmology;  Laterality: Left;  sleep apnea requests early   COLONOSCOPY     last 2012 - [facility]   ESOPHAGOGASTRODUODENOSCOPY     Multiple, last 01/07/2017 with Savary dilation to 18 mm   HEMORRHOID BANDING  2018   [facility]   LUMBAR DISC SURGERY  1984   L5    Social History   Tobacco Use   Smoking status: Never   Smokeless tobacco: Never  Vaping Use   Vaping Use: Never used  Substance Use Topics   Alcohol use: Yes    Comment: Average 1 glass of wine/week   Drug use: No   Family History  Problem Relation Age of Onset   Diabetes Paternal Aunt    Breast cancer Paternal Aunt    Hypertension Maternal Grandmother    Hypertension Maternal Grandfather    Allergies  Allergen Reactions   Nsaids     GI discomfort and gastritis   Tolmetin Other (See Comments)    GI discomfort and gastritis   Current Outpatient Medications on File Prior to Visit  Medication Sig Dispense Refill   ALPRAZolam (XANAX) 0.25 MG tablet TAKE 1 TABLET BY MOUTH TWICE A DAY AS NEEDED FOR ANXIETY (CAUSES SEDATION) 30 tablet 0   AMBULATORY NON FORMULARY MEDICATION CBD drops 30 mg oral at bedtime      aspirin EC 81 MG EC tablet Take 1 tablet (81 mg total) by mouth daily. Swallow whole. 30 tablet 11   B Complex-C (SUPER B COMPLEX PO) Take 1 tablet by mouth daily.     butalbital-acetaminophen-caffeine (FIORICET) 50-325-40 MG tablet Take 1 tablet by mouth every 6 (six) hours as needed. 60 tablet 0   calcium elemental as carbonate (BARIATRIC TUMS ULTRA) 400 MG chewable tablet Chew 1,000 mg by mouth daily.     cholecalciferol (VITAMIN D) 25 MCG (1000 UNIT) tablet Take 1,000 Units by mouth daily.     diclofenac sodium (VOLTAREN) 1 % GEL APPLY 2 GRAMS TOPICALLY FOUR TIMES A DAY TO AFFECTED AREAS 300 g 3   estradiol (ESTRACE) 0.1 MG/GM vaginal cream Insert small amount (1 cm) in vagina every other day 42.5 g 0   metoprolol tartrate (LOPRESSOR) 25 MG tablet Take 12.5 mg by mouth as needed.     mometasone (NASONEX) 50 MCG/ACT nasal spray Place 2 sprays into the nose daily as needed.     sucralfate (CARAFATE) 1 g tablet TAKE 1 TABLET (1 G TOTAL) BY MOUTH 4 (FOUR) TIMES DAILY - WITH MEALS AND AT BEDTIME. (Patient taking differently: Take 1 g by mouth 4 (four) times daily as needed.) 120 tablet 1   No current facility-administered medications on file prior to visit.     Review of Systems  Constitutional:  Negative for activity change, appetite change, fatigue, fever and unexpected weight change.  HENT:  Negative for congestion, ear pain, rhinorrhea, sinus pressure and sore throat.   Eyes:  Negative for pain, redness and visual disturbance.  Respiratory:  Negative for cough, shortness of breath and wheezing.  Cardiovascular:  Negative for chest pain and palpitations.  Gastrointestinal:  Negative for abdominal pain, blood in stool, constipation and diarrhea.  Endocrine: Negative for polydipsia and polyuria.  Genitourinary:  Negative for dysuria, frequency and urgency.  Musculoskeletal:  Positive for arthralgias. Negative for back pain and myalgias.  Skin:  Negative for pallor and rash.   Allergic/Immunologic: Negative for environmental allergies.  Neurological:  Negative for dizziness, syncope and headaches.  Hematological:  Negative for adenopathy. Does not bruise/bleed easily.  Psychiatric/Behavioral:  Negative for decreased concentration and dysphoric mood. The patient is not nervous/anxious.        Objective:   Physical Exam Constitutional:      General: She is not in acute distress.    Appearance: Normal appearance. She is well-developed and normal weight. She is not ill-appearing or diaphoretic.  HENT:     Head: Normocephalic and atraumatic.     Right Ear: Tympanic membrane, ear canal and external ear normal.     Left Ear: Tympanic membrane, ear canal and external ear normal.     Nose: Nose normal. No congestion.     Mouth/Throat:     Mouth: Mucous membranes are moist.     Pharynx: Oropharynx is clear. No posterior oropharyngeal erythema.  Eyes:     General: No scleral icterus.    Extraocular Movements: Extraocular movements intact.     Conjunctiva/sclera: Conjunctivae normal.     Pupils: Pupils are equal, round, and reactive to light.  Neck:     Thyroid: No thyromegaly.     Vascular: No carotid bruit or JVD.     Comments: Thyroid is not enlarged  Cardiovascular:     Rate and Rhythm: Normal rate and regular rhythm.     Pulses: Normal pulses.     Heart sounds: Normal heart sounds.     No gallop.  Pulmonary:     Effort: Pulmonary effort is normal. No respiratory distress.     Breath sounds: Normal breath sounds. No wheezing.     Comments: Good air exch Chest:     Chest wall: No tenderness.  Abdominal:     General: Bowel sounds are normal. There is no distension or abdominal bruit.     Palpations: Abdomen is soft. There is no mass.     Tenderness: There is no abdominal tenderness.     Hernia: No hernia is present.  Genitourinary:    Comments: Breast exam: No mass, nodules, thickening, tenderness, bulging, retraction, inflamation, nipple discharge or  skin changes noted.  No axillary or clavicular LA.     Musculoskeletal:        General: No tenderness. Normal range of motion.     Cervical back: Normal range of motion and neck supple. No rigidity. No muscular tenderness.     Right lower leg: No edema.     Left lower leg: No edema.     Comments: Borderline to mild  kyphosis   Lymphadenopathy:     Cervical: No cervical adenopathy.  Skin:    General: Skin is warm and dry.     Coloration: Skin is not pale.     Findings: No erythema or rash.     Comments: Solar lentigines diffusely   Neurological:     Mental Status: She is alert. Mental status is at baseline.     Cranial Nerves: No cranial nerve deficit.     Motor: No abnormal muscle tone.     Coordination: Coordination normal.     Gait: Gait normal.  Deep Tendon Reflexes: Reflexes are normal and symmetric. Reflexes normal.  Psychiatric:        Mood and Affect: Mood normal.        Cognition and Memory: Cognition and memory normal.           Assessment & Plan:   Problem List Items Addressed This Visit       Cardiovascular and Mediastinum   Hypertension, essential    bp in fair control at this time  BP Readings from Last 1 Encounters:  03/13/22 130/71  No changes needed Most recent labs reviewed  Disc lifstyle change with low sodium diet and exercise  Plan to continue  Amlodipine 2.5 mg daily  Aldactone 12.5 mg daily prn  Metoprolol 25 mg prn  bp at home is always lower than here       Relevant Medications   amLODipine (NORVASC) 5 MG tablet   spironolactone (ALDACTONE) 25 MG tablet   Migraine    Seldom /occasional  Has fioricet prn  With caution  Remote h/o CVA      Relevant Medications   amLODipine (NORVASC) 5 MG tablet   spironolactone (ALDACTONE) 25 MG tablet     Digestive   GERD   Relevant Medications   linaclotide (LINZESS) 145 MCG CAPS capsule   omeprazole (PRILOSEC) 40 MG capsule     Endocrine   Hypothyroidism    Hypothyroidism  Pt has  no clinical changes No change in energy level/ hair or skin/ edema and no tremor Lab Results  Component Value Date   TSH 1.53 03/08/2022    Taking levothyroxine 75 mcg daily  Pt suspects that her CBD product affects the TSH, but she needs it for pain and anxiety  Plan to go up to past dose of CBD then check TSH again in a month or so If helpful, I am willing to adjust the levothyroxine accordingly        Relevant Medications   levothyroxine (SYNTHROID) 75 MCG tablet     Musculoskeletal and Integument   Osteopenia    dexa 2017  Ordered one-pt wil call to schedule No new falls or fractures Limited exercise due to knee pain - discussed other options   Taking D and ca in form of tums         Other   Colon cancer screening    cologuard neg 03/2021 This is good for 3 y but current guidelines       Estrogen deficiency   Relevant Orders   DG Bone Density   Hyperlipidemia    Disc goals for lipids and reasons to control them Rev last labs with pt Rev low sat fat diet in detail  She is statin intolerant but eats heart healthy diet  LDL stable at 101       Relevant Medications   amLODipine (NORVASC) 5 MG tablet   spironolactone (ALDACTONE) 25 MG tablet   Routine general medical examination at a health care facility - Primary    Reviewed health habits including diet and exercise and skin cancer prevention Reviewed appropriate screening tests for age  Also reviewed health mt list, fam hx and immunization status , as well as social and family history   See HPI Labs reviewed  dexa ordered (no falls or fractures) Mammogram utd 10/2021 cologuard utd from 03/2021, good for 3 y  PHQ score of 0 Encouraged her to stay active

## 2022-03-13 NOTE — Assessment & Plan Note (Signed)
bp in fair control at this time  BP Readings from Last 1 Encounters:  03/13/22 130/71   No changes needed Most recent labs reviewed  Disc lifstyle change with low sodium diet and exercise  Plan to continue  Amlodipine 2.5 mg daily  Aldactone 12.5 mg daily prn  Metoprolol 25 mg prn  bp at home is always lower than here

## 2022-03-13 NOTE — Assessment & Plan Note (Signed)
Reviewed health habits including diet and exercise and skin cancer prevention Reviewed appropriate screening tests for age  Also reviewed health mt list, fam hx and immunization status , as well as social and family history   See HPI Labs reviewed  dexa ordered (no falls or fractures) Mammogram utd 10/2021 cologuard utd from 03/2021, good for 3 y  PHQ score of 0 Encouraged her to stay active

## 2022-03-13 NOTE — Patient Instructions (Addendum)
Go back up on the CBD for the pain /anxiety and GERD  Once you get to the dose that helps -let's check labs 2 weeks later  We can adjust the thyroid dose accordingly   Call us when you are ready to check the thyroid   If you get a cut/scrape or injury- remember you are due for a tetanus shot   Water exercise classes may be good for you   Call the [facility] to schedule your dexa (bone density test)   Please call the location of your choice from the menu below to schedule your Mammogram and/or Bone Density appointment.    [facility]                      Phone:  [phone] [address], [place] [address]                                                             Services: Traditional and 3D Mammogram, [place] [facility]                 Phone: [phone] [address], [place] [address]    Service: Bone Density ONLY   *this site does NOT perform mammograms  [facility]                        Phone:  [phone] [address], [place] [address]                                            Services:  3D Mammogram and [place] at [facility]   Phone:  [phone]   [place],  [address]  Services: 3D Mammogram and Bone Density  [place] [facility])  Phone:  [phone]   [address] [place], [place] [address]                                              Services:  3D Mammogram and Bone Density

## 2022-03-13 NOTE — Assessment & Plan Note (Signed)
Seldom /occasional  Has fioricet prn  With caution  Remote h/o CVA

## 2022-03-13 NOTE — Assessment & Plan Note (Signed)
Hypothyroidism  Pt has no clinical changes No change in energy level/ hair or skin/ edema and no tremor Lab Results  Component Value Date   TSH 1.53 03/08/2022    Taking levothyroxine 75 mcg daily  Pt suspects that her CBD product affects the TSH, but she needs it for pain and anxiety  Plan to go up to past dose of CBD then check TSH again in a month or so If helpful, I am willing to adjust the levothyroxine accordingly

## 2022-03-13 NOTE — Assessment & Plan Note (Signed)
Disc goals for lipids and reasons to control them Rev last labs with pt Rev low sat fat diet in detail  She is statin intolerant but eats heart healthy diet  LDL stable at 101

## 2022-03-13 NOTE — Assessment & Plan Note (Signed)
dexa 2017  Ordered one-pt wil call to schedule No new falls or fractures Limited exercise due to knee pain - discussed other options   Taking D and ca in form of tums

## 2022-03-13 NOTE — Assessment & Plan Note (Signed)
cologuard neg 03/2021 This is good for 3 y but current guidelines

## 2022-03-18 IMAGING — CT CT HEART MORP W/ CTA COR W/ SCORE W/ CA W/CM &/OR W/O CM
1 of 14 series · 3 of 20 positions shown, 4 images · non-contrast
Comparison: None.

Addendum:
CLINICAL DATA: Abnormal ekg, palpitations

EXAM:
Cardiac/Coronary  CTA
TECHNIQUE: The patient was scanned on a Siemens Somatoform go.Top scanner.

[Series 39: ms multiphase cta coronary 0.60 · axial · 0.34mm/px · z∈[-1098,-1038]mm · 3 of 2673 slices shown, 4 images]
[im 669/2673  vessel]
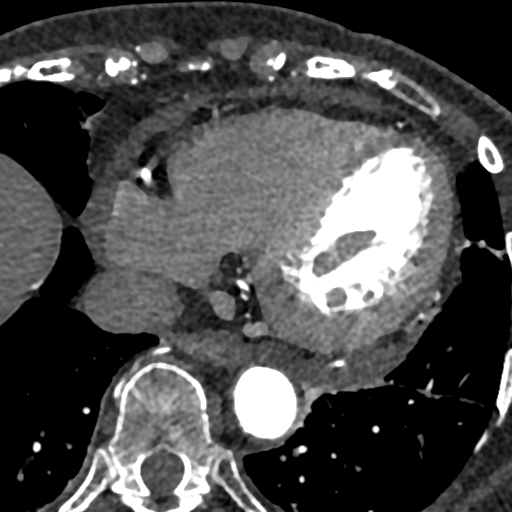
[im 669/2673  lung]
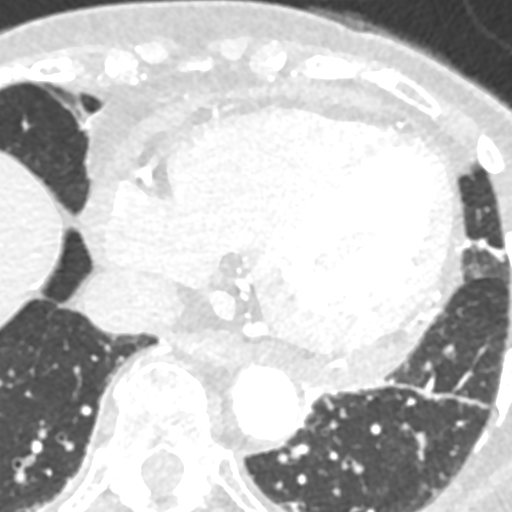
[im 1337/2673  vessel]
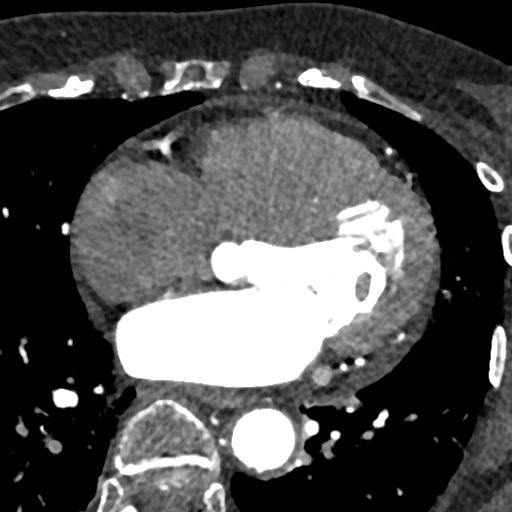
[im 2005/2673  vessel]
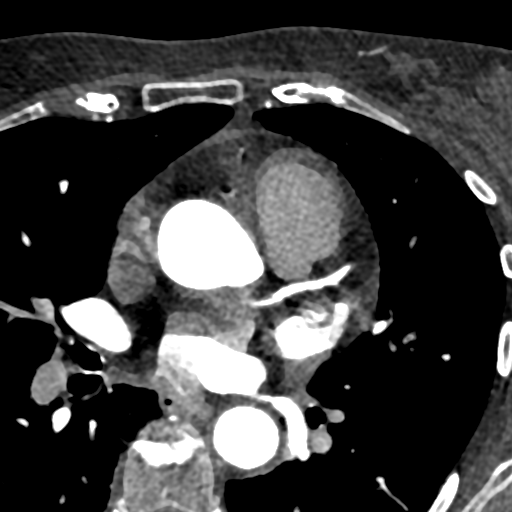

[3 of 20 positions shown; findings below may reference images not displayed]

FINDINGS: A retrospective scan was triggered in the descending thoracic aorta.
Axial non-contrast 3 mm slices were carried out through the heart.
The data set was analyzed on a dedicated work station and scored
using the Agatson method. Gantry rotation speed was 330 msecs and
collimation was .6 mm. 50mg of metoprolol and 0.8 mg of sl NTG was
given. The 3D data set was reconstructed in 5% intervals of the
60-95 % of the R-R cycle. Diastolic phases were analyzed on a
dedicated work station using MPR, MIP and VRT modes. The patient
received 75 cc of contrast.

Aorta: Normal size. Mild root and descending aorta calcifications.
No dissection.

Aortic Valve:  Trileaflet.  mild calcifications.

Coronary Arteries:  Normal coronary origin.  Right dominance.

RCA is a dominant artery that gives rise to PDA and PLA. There is no
plaque.

Left main gives rise to LAD and LCX arteries. There is no LM
disease.

LAD has minimal calcified plaque in the proximal and mid segments
causing minimal stenosis (<25%).

LCX is a non-dominant artery that gives rise to three obtuse
marginal branches. There is no plaque.

Other findings:

Normal pulmonary vein drainage into the left atrium.

Normal left atrial appendage without a thrombus.

Normal size of the pulmonary artery.
IMPRESSION: 1. Coronary calcium score of 24.2. This was 30th percentile for age
and sex matched control.

2. Normal coronary origin with right dominance.

3. Minimal LAD calcifications causing minimal stenosis (<25%).

4. CAD-RADS 1. Minimal non-obstructive CAD (0-24%). Consider
non-atherosclerotic causes of chest pain. Consider preventive
therapy and risk factor modification.

EXAM:
OVER-READ INTERPRETATION  CT CHEST

The following report is an over-read performed by radiologist Dr.
does not include interpretation of cardiac or coronary anatomy or
pathology. The coronary CTA interpretation by the cardiologist is
attached.
FINDINGS: Mediastinum/nodes: No mass or adenopathy identified.

Lungs/pleura: No pleural effusion, airspace consolidation, or
pneumothorax. Scar noted within the lingula and left lower lobe.

Upper abdomen: No acute abnormality within the imaged portions of
the upper abdomen.

Musculoskeletal: No acute or suspicious abnormality.
IMPRESSION: Negative over-read.

*** End of Addendum ***
FINDINGS: A retrospective scan was triggered in the descending thoracic aorta.
Axial non-contrast 3 mm slices were carried out through the heart.
The data set was analyzed on a dedicated work station and scored
using the Agatson method. Gantry rotation speed was 330 msecs and
collimation was .6 mm. 50mg of metoprolol and 0.8 mg of sl NTG was
given. The 3D data set was reconstructed in 5% intervals of the
60-95 % of the R-R cycle. Diastolic phases were analyzed on a
dedicated work station using MPR, MIP and VRT modes. The patient
received 75 cc of contrast.

Aorta: Normal size. Mild root and descending aorta calcifications.
No dissection.

Aortic Valve:  Trileaflet.  mild calcifications.

Coronary Arteries:  Normal coronary origin.  Right dominance.

RCA is a dominant artery that gives rise to PDA and PLA. There is no
plaque.

Left main gives rise to LAD and LCX arteries. There is no LM
disease.

LAD has minimal calcified plaque in the proximal and mid segments
causing minimal stenosis (<25%).

LCX is a non-dominant artery that gives rise to three obtuse
marginal branches. There is no plaque.

Other findings:

Normal pulmonary vein drainage into the left atrium.

Normal left atrial appendage without a thrombus.

Normal size of the pulmonary artery.
IMPRESSION: 1. Coronary calcium score of 24.2. This was 30th percentile for age
and sex matched control.

2. Normal coronary origin with right dominance.

3. Minimal LAD calcifications causing minimal stenosis (<25%).

4. CAD-RADS 1. Minimal non-obstructive CAD (0-24%). Consider
non-atherosclerotic causes of chest pain. Consider preventive
therapy and risk factor modification.

## 2022-03-27 ENCOUNTER — Ambulatory Visit: Payer: Medicare HMO | Admitting: Cardiology

## 2022-03-27 ENCOUNTER — Encounter: Payer: Self-pay | Admitting: Cardiology

## 2022-03-27 VITALS — BP 120/70 | HR 64 | Ht 66.0 in | Wt 129.0 lb

## 2022-03-27 DIAGNOSIS — I8393 Asymptomatic varicose veins of bilateral lower extremities: Secondary | ICD-10-CM

## 2022-03-27 DIAGNOSIS — I10 Essential (primary) hypertension: Secondary | ICD-10-CM | POA: Diagnosis not present

## 2022-03-27 NOTE — Progress Notes (Signed)
Cardiology Office Note:    Date:  03/27/2022   ID:  Sarah Harper, DOB 09-Jul-1941, MRN [id number]  PCP:  Abner Greenspan, MD  [facility] Cardiologist:  Kate Sable, MD  [place] Electrophysiologist:  None   Referring MD: Abner Greenspan, MD   Chief Complaint  Patient presents with   Follow-up    6 month F/U-Patient reports getting labile BP readings.    History of Present Illness:    Sarah Harper is a 81 y.o. female with a hx of hypertension, degenerative joint disease, GERD, CVA, venous insufficiency who presents for follow-up and concerns regarding blood pressure.   States blood pressure was running low couple of weeks ago, she reduce her Lopressor to 12.5 mg twice daily and later discovered her blood pressures were running high in the 140s.  Increase dose of Lopressor to 25 mg twice daily and blood pressures have been adequately controlled with systolics in the 211H.  She states feeling well on current medication doses, denies any adverse effects.  No concerns at this time.  Prior notes echocardiogram on 01/2015 due to syncope which showed normal systolic function, EF 60 to 41%, normal diastolic function. Medication adjustments were previously made.  She wanted to take less medications, as such irbesartan was started, Lopressor and Aldactone was stopped.  She complains of having elevated blood pressure with medication changes.  Her previous medications including Aldactone and Lopressor were restarted.  Norvasc was continued.  Past Medical History:  Diagnosis Date   Anxiety    Cataract 1997   DDD (degenerative disc disease)    chronic back pain   ETD (eustachian tube dysfunction)    GERD (gastroesophageal reflux disease)    Hypertension 2022   IBS (irritable bowel syndrome)    Meniere's disease    Migraine    still gets visual aura from time to time   Mild cognitive impairment    Osteoporosis    Sleep apnea    CPAP   Stroke ([place]) 2022   Syncopal  episodes    after work-up - possible seizures?   Thyroid disease    hypothyroid   TIA (transient ischemic attack) 11/09/2020    Past Surgical History:  Procedure Laterality Date   APPENDECTOMY     CATARACT EXTRACTION W/PHACO Right 05/21/2019   Procedure: CATARACT EXTRACTION PHACO AND INTRAOCULAR LENS PLACEMENT ([place]) RIGHT panoptix lens  00:35.0  21.7%  7.61;  Surgeon: Leandrew Koyanagi, MD;  Location: [facility];  Service: Ophthalmology;  Laterality: Right;  sleep apnea requests early   CATARACT EXTRACTION W/PHACO Left 06/11/2019   Procedure: CATARACT EXTRACTION PHACO AND INTRAOCULAR LENS PLACEMENT ([facility]) LEFT PANOPTIX TORIC LENS  00:50.0  20.3%  10.21;  Surgeon: Leandrew Koyanagi, MD;  Location: [facility];  Service: Ophthalmology;  Laterality: Left;  sleep apnea requests early   COLONOSCOPY     last 2012 - [facility]   ESOPHAGOGASTRODUODENOSCOPY     Multiple, last 01/07/2017 with Savary dilation to 18 mm   HEMORRHOID BANDING  2018   [facility]   LUMBAR DISC SURGERY  1984   L5     Current Medications: Current Meds  Medication Sig   ALPRAZolam (XANAX) 0.25 MG tablet TAKE 1 TABLET BY MOUTH TWICE A DAY AS NEEDED FOR ANXIETY (CAUSES SEDATION)   AMBULATORY NON FORMULARY MEDICATION CBD drops 30 mg oral at bedtime   amLODipine (NORVASC) 5 MG tablet Take 1 tablet (5 mg total) by mouth daily. Take 1/2 daily   aspirin EC  81 MG EC tablet Take 1 tablet (81 mg total) by mouth daily. Swallow whole.   B Complex-C (SUPER B COMPLEX PO) Take 1 tablet by mouth daily.   butalbital-acetaminophen-caffeine (FIORICET) 50-325-40 MG tablet Take 1 tablet by mouth every 6 (six) hours as needed.   calcium elemental as carbonate (BARIATRIC TUMS ULTRA) 400 MG chewable tablet Chew 1,000 mg by mouth daily.   cholecalciferol (VITAMIN D) 25 MCG (1000 UNIT) tablet Take 1,000 Units by mouth daily.   diclofenac sodium (VOLTAREN) 1 % GEL APPLY 2 GRAMS TOPICALLY FOUR TIMES A DAY TO AFFECTED AREAS    estradiol (ESTRACE) 0.1 MG/GM vaginal cream Insert small amount (1 cm) in vagina every other day   levothyroxine (SYNTHROID) 75 MCG tablet Take 1 tablet (75 mcg total) by mouth daily.   linaclotide (LINZESS) 145 MCG CAPS capsule Take 1 capsule (145 mcg total) by mouth as needed.   metoprolol tartrate (LOPRESSOR) 25 MG tablet Take 25 mg by mouth 2 (two) times daily.   mometasone (NASONEX) 50 MCG/ACT nasal spray Place 2 sprays into the nose daily as needed.   omeprazole (PRILOSEC) 40 MG capsule Take 1 capsule (40 mg total) by mouth daily.   spironolactone (ALDACTONE) 25 MG tablet Take 0.5 tablets (12.5 mg total) by mouth as needed.   sucralfate (CARAFATE) 1 g tablet Take 1 g by mouth 4 (four) times daily as needed.     Allergies:   Nsaids and Tolmetin   Social History   Socioeconomic History   Marital status: Married    Spouse name: Erland   Number of children: 0   Years of education: Not on file   Highest education level: Not on file  Occupational History   Occupation: Retired  Tobacco Use   Smoking status: Never   Smokeless tobacco: Never  Vaping Use   Vaping Use: Never used  Substance and Sexual Activity   Alcohol use: Yes    Comment: Average 1 glass of wine/week   Drug use: No   Sexual activity: Yes    Birth control/protection: Post-menopausal  Other Topics Concern   Not on file  Social History Narrative   Married, husband is a type I diabetic.  No children.   Elderly mother in her 71s lives in an assisted living facility.   Very occasional white wine with dinner, never smoker no drug use.  Limited caffeine.   Social Determinants of Health   Financial Resource Strain: Low Risk  (12/21/2021)   Overall Financial Resource Strain (CARDIA)    Difficulty of Paying Living Expenses: Not hard at all  Food Insecurity: No Food Insecurity (12/21/2021)   Hunger Vital Sign    Worried About Running Out of Food in the Last Year: Never true    Ran Out of Food in the Last Year: Never true   Transportation Needs: No Transportation Needs (12/21/2021)   PRAPARE - Hydrologist (Medical): No    Lack of Transportation (Non-Medical): No  Physical Activity: Insufficiently Active (12/21/2021)   Exercise Vital Sign    Days of Exercise per Week: 7 days    Minutes of Exercise per Session: 10 min  Stress: No Stress Concern Present (12/21/2021)   [place]    Feeling of Stress : Only a little  Social Connections: Moderately Integrated (12/21/2021)   Social Connection and Isolation Panel [NHANES]    Frequency of Communication with Friends and Family: Twice a week  Frequency of Social Gatherings with Friends and Family: Twice a week    Attends Religious Services: Never    Marine scientist or Organizations: Yes    Attends Music therapist: More than 4 times per year    Marital Status: Married     Family History: The patient's family history includes Breast cancer in her paternal aunt; Diabetes in her paternal aunt; Hypertension in her maternal grandfather and maternal grandmother.  ROS:   Please see the history of present illness.     All other systems reviewed and are negative.  EKGs/Labs/Other Studies Reviewed:    The following studies were reviewed today:   EKG:  EKG is  ordered today.  The ekg ordered today demonstrates normal sinus rhythm.  Recent Labs: 03/08/2022: ALT 18; BUN 11; Creatinine, Ser 0.69; Hemoglobin 12.7; Platelets 248.0; Potassium 3.9; Sodium 137; TSH 1.53  Recent Lipid Panel    Component Value Date/Time   CHOL 181 03/08/2022 0819   CHOL 119 12/08/2020 1542   TRIG 63.0 03/08/2022 0819   HDL 67.50 03/08/2022 0819   HDL 74 12/08/2020 1542   CHOLHDL 3 03/08/2022 0819   VLDL 12.6 03/08/2022 0819   LDLCALC 101 (H) 03/08/2022 0819   LDLCALC 33 12/08/2020 1542   LDLDIRECT 115.5 02/12/2013 0755    Physical Exam:    VS:  BP 120/70 (BP Location:  Left Arm, Patient Position: Sitting, Cuff Size: Normal)   Pulse 64   Ht '5\' 6"'$  (1.676 m)   Wt 129 lb (58.5 kg)   SpO2 97%   BMI 20.82 kg/m     Wt Readings from Last 3 Encounters:  03/27/22 129 lb (58.5 kg)  03/13/22 130 lb 8 oz (59.2 kg)  02/01/22 129 lb 2 oz (58.6 kg)     GEN:  Well nourished, well developed in no acute distress HEENT: Normal NECK: No JVD; No carotid bruits CARDIAC: RRR, no murmur RESPIRATORY:  Clear to auscultation without rales, wheezing or rhonchi  ABDOMEN: Soft, non-tender, non-distended MUSCULOSKELETAL: no edema; bilateral varicose veins noted SKIN: Warm and dry NEUROLOGIC:  Alert and oriented x 3 PSYCHIATRIC:  Normal affect   ASSESSMENT:    1. Primary hypertension   2. Varicose veins of both lower extremities, unspecified whether complicated     PLAN:    In order of problems listed above:  Hypertension,  BP appears adequately controlled on current meds.  No need to increase patient's medication doses.  Continue Lopressor, Aldactone, Norvasc.   Varicose veins, no edema.  Leg raising, compression stocking advised  Follow-up  as needed  Total encounter time 30 minutes  Greater than 50% was spent in counseling and coordination of care with the patient  Medication Adjustments/Labs and Tests Ordered: Current medicines are reviewed at length with the patient today.  Concerns regarding medicines are outlined above.  Orders Placed This Encounter  Procedures   EKG 12-Lead      No orders of the defined types were placed in this encounter.     Patient Instructions  Medication Instructions:  Your physician recommends that you continue on your current medications as directed. Please refer to the Current Medication list given to you today.  *If you need a refill on your cardiac medications before your next appointment, please call your pharmacy*   Lab Work: None ordered If you have labs (blood work) drawn today and your tests are completely  normal, you will receive your results only by: [place] (if you have MyChart) OR  A paper copy in the mail If you have any lab test that is abnormal or we need to change your treatment, we will call you to review the results.   Testing/Procedures: None ordered   Follow-Up: At [facility], you and your health needs are our priority.  As part of our continuing mission to provide you with exceptional heart care, we have created designated Provider Care Teams.  These Care Teams include your primary Cardiologist (physician) and Advanced Practice Providers (APPs -  Physician Assistants and Nurse Practitioners) who all work together to provide you with the care you need, when you need it.  We recommend signing up for the patient portal called "MyChart".  Sign up information is provided on this After Visit Summary.  MyChart is used to connect with patients for Virtual Visits (Telemedicine).  Patients are able to view lab/test results, encounter notes, upcoming appointments, etc.  Non-urgent messages can be sent to your provider as well.   To learn more about what you can do with MyChart, go to [email or web address].    Your next appointment:   As needed  The format for your next appointment:   In Person  Provider:   You may see Kate Sable, MD or one of the following Advanced Practice Providers on your designated Care Team:   Murray Hodgkins, NP Christell Faith, PA-C Cadence Kathlen Mody, [place]   Other Instructions N/A  Important Information About Sugar       \ Signed, Kate Sable, MD  03/27/2022 10:31 AM    [place]

## 2022-03-27 NOTE — Patient Instructions (Signed)
Medication Instructions:  Your physician recommends that you continue on your current medications as directed. Please refer to the Current Medication list given to you today.  *If you need a refill on your cardiac medications before your next appointment, please call your pharmacy*   Lab Work: None ordered If you have labs (blood work) drawn today and your tests are completely normal, you will receive your results only by: [place] (if you have MyChart) OR A paper copy in the mail If you have any lab test that is abnormal or we need to change your treatment, we will call you to review the results.   Testing/Procedures: None ordered   Follow-Up: At [facility], you and your health needs are our priority.  As part of our continuing mission to provide you with exceptional heart care, we have created designated Provider Care Teams.  These Care Teams include your primary Cardiologist (physician) and Advanced Practice Providers (APPs -  Physician Assistants and Nurse Practitioners) who all work together to provide you with the care you need, when you need it.  We recommend signing up for the patient portal called "MyChart".  Sign up information is provided on this After Visit Summary.  MyChart is used to connect with patients for Virtual Visits (Telemedicine).  Patients are able to view lab/test results, encounter notes, upcoming appointments, etc.  Non-urgent messages can be sent to your provider as well.   To learn more about what you can do with MyChart, go to [email or web address].    Your next appointment:   As needed  The format for your next appointment:   In Person  Provider:   You may see Kate Sable, MD or one of the following Advanced Practice Providers on your designated Care Team:   Murray Hodgkins, NP Christell Faith, PA-C Cadence Kathlen Mody, [place]   Other Instructions N/A  Important Information About Sugar

## 2022-04-18 NOTE — Telephone Encounter (Signed)
Patient called and said that Dr Glori Bickers wanted her to come back and get her thyroid checked again 2 weeks after she stabilized  her CBD dose, She said its been 3 weeks. Is this okay to schedule and does it just need to be lab work? Call back [phone]

## 2022-04-18 NOTE — Telephone Encounter (Signed)
Just labs are fine -TSH Thanks

## 2022-04-19 ENCOUNTER — Telehealth: Payer: Self-pay | Admitting: Family Medicine

## 2022-04-19 ENCOUNTER — Other Ambulatory Visit (INDEPENDENT_AMBULATORY_CARE_PROVIDER_SITE_OTHER): Payer: Medicare HMO

## 2022-04-19 DIAGNOSIS — E039 Hypothyroidism, unspecified: Secondary | ICD-10-CM

## 2022-04-19 NOTE — Telephone Encounter (Signed)
-----   Message from Ellamae Sia sent at 04/19/2022 10:33 AM EDT ----- Regarding: Lab orders for today Lab orders for today, late add on

## 2022-04-19 NOTE — Addendum Note (Signed)
Addended by: Ellamae Sia on: 04/19/2022 12:05 PM   Modules accepted: Orders

## 2022-04-20 LAB — TSH: TSH: 0.93 u[IU]/mL (ref 0.35–5.50)

## 2022-04-20 NOTE — Telephone Encounter (Signed)
Patient had lab appt

## 2022-04-28 ENCOUNTER — Ambulatory Visit
Admission: RE | Admit: 2022-04-28 | Discharge: 2022-04-28 | Disposition: A | Discharge status: Home / Self Care | Payer: Medicare HMO | Source: Ambulatory Visit | Attending: Family Medicine | Admitting: Family Medicine

## 2022-04-28 DIAGNOSIS — E2839 Other primary ovarian failure: Secondary | ICD-10-CM | POA: Diagnosis present

## 2022-05-06 ENCOUNTER — Other Ambulatory Visit: Payer: Self-pay | Admitting: Family Medicine

## 2022-05-08 NOTE — Telephone Encounter (Signed)
On list as an historical entry, ? If cardiology usually fills med or PCP, please advise

## 2022-05-17 ENCOUNTER — Ambulatory Visit (INDEPENDENT_AMBULATORY_CARE_PROVIDER_SITE_OTHER): Payer: Medicare HMO

## 2022-05-17 ENCOUNTER — Ambulatory Visit: Payer: Medicare HMO

## 2022-05-17 DIAGNOSIS — Z23 Encounter for immunization: Secondary | ICD-10-CM | POA: Diagnosis not present

## 2022-05-28 ENCOUNTER — Encounter: Payer: Self-pay | Admitting: Family Medicine

## 2022-06-05 ENCOUNTER — Encounter: Payer: Self-pay | Admitting: Family Medicine

## 2022-06-05 ENCOUNTER — Ambulatory Visit (INDEPENDENT_AMBULATORY_CARE_PROVIDER_SITE_OTHER): Payer: Medicare HMO | Admitting: Family Medicine

## 2022-06-05 ENCOUNTER — Telehealth: Payer: Self-pay

## 2022-06-05 VITALS — BP 130/70 | HR 66 | Temp 97.8°F | Ht 64.25 in | Wt 129.0 lb

## 2022-06-05 DIAGNOSIS — R350 Frequency of micturition: Secondary | ICD-10-CM

## 2022-06-05 DIAGNOSIS — N390 Urinary tract infection, site not specified: Secondary | ICD-10-CM | POA: Diagnosis not present

## 2022-06-05 LAB — POC URINALSYSI DIPSTICK (AUTOMATED)
Bilirubin, UA: NEGATIVE
Glucose, UA: NEGATIVE
Ketones, UA: NEGATIVE
Nitrite, UA: NEGATIVE
Protein, UA: NEGATIVE
Spec Grav, UA: <=1.005 — AB (ref 1.010–1.025)
Urobilinogen, UA: 0.2 E.U./dL
pH, UA: 6 (ref 5.0–8.0)

## 2022-06-05 MED ORDER — SULFAMETHOXAZOLE-TRIMETHOPRIM 800-160 MG PO TABS: 1.0000 | ORAL_TABLET | Freq: Two times a day (BID) | ORAL | 0 refills | Status: DC

## 2022-06-05 NOTE — Telephone Encounter (Signed)
[place] Night - Client TELEPHONE ADVICE RECORD AccessNurse Patient Name: Sarah Harper ZGYF Gender: Female DOB: 02-Jul-1941 Age: 81 Y 11 M 26 D Return Phone Number: [phone] (Primary), [phone] (Secondary) Address: City/ State/ Zip: [place] [address] Client [facility] Night - Client Client Site [place] Provider Glori Bickers, Roque Lias - MD Contact Type Call Who Is Calling Patient / Member / Family / Caregiver Call Type Triage / Clinical Relationship To Patient Self Return Phone Number [phone] (Secondary) Chief Complaint Urination Frequency Reason for Call Symptomatic / Request for [place] states this is her 3rd day of UTI. Sx are burning and frequency of urinating Translation No Nurse Assessment Nurse: [place], RN, Estill Bamberg Date/Time (Eastern Time): 06/05/2022 8:02:24 AM Confirm and document reason for call. If symptomatic, describe symptoms. ---caller states she has burning and frequency. UTI 2x recently. started Friday evening Does the patient have any new or worsening symptoms? ---Yes Will a triage be completed? ---Yes Related visit to physician within the last 2 weeks? ---No Does the PT have any chronic conditions? (i.e. diabetes, asthma, this includes High risk factors for pregnancy, etc.) ---Yes List chronic conditions. ---HTN, thyroid Is this a behavioral health or substance abuse call? ---No Guidelines Guideline Title Affirmed Question Affirmed Notes Nurse Date/Time Eilene Ghazi Time) Urination Pain - Female Age > 69 years Humfleet, RN, Estill Bamberg 06/05/2022 8:04:30 AM Disp. Time Eilene Ghazi Time) Disposition Final User 06/05/2022 8:06:49 AM See PCP within 24 Hours Yes [place], RN, Estill Bamberg Final Disposition 06/05/2022 8:06:49 AM See PCP within 24 Hours Yes [place], RN, Estill Bamberg PLEASE NOTE: All timestamps contained within this report are  represented as [place] Standard Time. CONFIDENTIALTY NOTICE: This fax transmission is intended only for the addressee. It contains information that is legally privileged, confidential or otherwise protected from use or disclosure. If you are not the intended recipient, you are strictly prohibited from reviewing, disclosing, copying using or disseminating any of this information or taking any action in reliance on or regarding this information. If you have received this fax in error, please notify us immediately by telephone so that we can arrange for its return to [place]. Phone: [phone], Toll-Free: [phone], Fax: [phone] Page: 2 of 2 Call Id: [id number] Caller Disagree/Comply Comply Caller Understands Yes PreDisposition InappropriateToAsk Care Advice Given Per Guideline SEE PCP WITHIN 24 HOURS: * IF OFFICE WILL BE OPEN: You need to be examined within the next 24 hours. Call your doctor (or NP/PA) when the office opens and make an appointment. * This could be an urinary tract infection. REASSURANCE AND EDUCATION - POSSIBLE URINE INFECTION: CARE ADVICE given per Urination Pain - Female (Adult) guideline. * You become worse * Fever or back pain occurs CALL BACK IF: Referrals REFERRED TO PCP OFFIC

## 2022-06-05 NOTE — Progress Notes (Signed)
Brylan Dec T. Aryahi Denzler, MD, [place] at [facility] [place], [id number]  Phone: [phone]  FAX: [phone]  URIJAH RAYNOR - 81 y.o. female  MRN [id number]  Date of Birth: 1940/09/19  Date: 06/05/2022  PCP: Abner Greenspan, MD  Referral: Abner Greenspan, MD  Chief Complaint  Patient presents with   Urinary Frequency   Burning with Urination   Subjective:   This 81 y.o. female patient presents with burning, urgency. No vaginal discharge or external irritation.  No STD exposure. no flank pain.  Friday evening felt some urgency and frequency.  Has gotten really bad this AM Had not had any for years, now 3 times in a short time period.   Some hypogastric pain.   Review of Systems is noted in the HPI, as appropriate  Objective:   Blood pressure 130/70, pulse 66, temperature 97.8 F (36.6 C), temperature source Oral, height 5' 4.25" (1.632 m), weight 129 lb (58.5 kg), SpO2 96 %.   GEN: WDWN HEENT: Atraumatc, normocephalic. CV: RRR, No M/G/R PULM: CTA B, No wheezes, crackles, or rhonchi ABD: S, NT, ND, +BS, no rebound. No CVAT. + suprapubic tenderness.  Objective Data: Results for orders placed or performed in visit on 06/05/22  POCT Urinalysis Dipstick (Automated)  Result Value Ref Range   Color, UA Yellow    Clarity, UA Hazy    Glucose, UA Negative Negative   Bilirubin, UA Negative    Ketones, UA Negative    Spec Grav, UA <=1.005 (A) 1.010 - 1.025   Blood, UA Large (3+)    pH, UA 6.0 5.0 - 8.0   Protein, UA Negative Negative   Urobilinogen, UA 0.2 0.2 or 1.0 E.U./dL   Nitrite, UA Negative    Leukocytes, UA Small (1+) (A) Negative    Assessment and Plan:     ICD-10-CM   1. Acute lower UTI  N39.0     2. Urinary frequency  R35.0 POCT Urinalysis Dipstick (Automated)    Urine Culture      Rx with ABX as below. Drink plenty of fluids and supportive care.  Follow-up: No follow-ups on  file.  Meds ordered this encounter  Medications   sulfamethoxazole-trimethoprim (BACTRIM DS) 800-160 MG tablet    Sig: Take 1 tablet by mouth 2 (two) times daily.    Dispense:  14 tablet    Refill:  0   Orders Placed This Encounter  Procedures   Urine Culture   POCT Urinalysis Dipstick (Automated)    Signed,  Tomeshia Pizzi T. Anastashia Westerfeld, MD   Patient's Medications  New Prescriptions   SULFAMETHOXAZOLE-TRIMETHOPRIM (BACTRIM DS) 800-160 MG TABLET    Take 1 tablet by mouth 2 (two) times daily.  Previous Medications   ALPRAZOLAM (XANAX) 0.25 MG TABLET    TAKE 1 TABLET BY MOUTH TWICE A DAY AS NEEDED FOR ANXIETY (CAUSES SEDATION)   AMBULATORY NON FORMULARY MEDICATION    CBD drops 35 mg oral at breakfast and 25 mg oral at bedtime   AMLODIPINE (NORVASC) 5 MG TABLET    Take 1 tablet (5 mg total) by mouth daily. Take 1/2 daily   ASPIRIN EC 81 MG EC TABLET    Take 1 tablet (81 mg total) by mouth daily. Swallow whole.   B COMPLEX-C (SUPER B COMPLEX PO)    Take 1 tablet by mouth daily.   BUTALBITAL-ACETAMINOPHEN-CAFFEINE (FIORICET) 50-325-40 MG TABLET    Take 1 tablet by mouth every 6 (six)  hours as needed.   CALCIUM ELEMENTAL AS CARBONATE (BARIATRIC TUMS ULTRA) 400 MG CHEWABLE TABLET    Chew 1,000 mg by mouth daily.   CHOLECALCIFEROL (VITAMIN D) 25 MCG (1000 UNIT) TABLET    Take 1,000 Units by mouth daily.   DICLOFENAC SODIUM (VOLTAREN) 1 % GEL    APPLY 2 GRAMS TOPICALLY FOUR TIMES A DAY TO AFFECTED AREAS   ESTRADIOL (ESTRACE) 0.1 MG/GM VAGINAL CREAM    Insert small amount (1 cm) in vagina every other day   LEVOTHYROXINE (SYNTHROID) 75 MCG TABLET    Take 1 tablet (75 mcg total) by mouth daily.   LINACLOTIDE (LINZESS) 145 MCG CAPS CAPSULE    Take 1 capsule (145 mcg total) by mouth as needed.   METOPROLOL TARTRATE (LOPRESSOR) 25 MG TABLET    Take 12.5 mg by mouth 2 (two) times daily.   MOMETASONE (NASONEX) 50 MCG/ACT NASAL SPRAY    Place 2 sprays into the nose daily as needed.   OMEPRAZOLE (PRILOSEC)  40 MG CAPSULE    Take 1 capsule (40 mg total) by mouth daily.   SPIRONOLACTONE (ALDACTONE) 25 MG TABLET    Take 0.5 tablets (12.5 mg total) by mouth as needed.   SUCRALFATE (CARAFATE) 1 G TABLET    Take 1 g by mouth 4 (four) times daily as needed.  Modified Medications   No medications on file  Discontinued Medications   METOPROLOL TARTRATE (LOPRESSOR) 25 MG TABLET    TAKE 1 TABLET TWICE A DAY

## 2022-06-05 NOTE — Telephone Encounter (Signed)
Per appt notes pt already has appt with Dr Lorelei Pont on 06/05/22 at 11:40. Sending note to Dr Lorelei Pont.

## 2022-06-08 ENCOUNTER — Telehealth: Payer: Self-pay | Admitting: *Deleted

## 2022-06-08 DIAGNOSIS — E039 Hypothyroidism, unspecified: Secondary | ICD-10-CM

## 2022-06-08 LAB — URINE CULTURE
MICRO NUMBER:: 14055527
SPECIMEN QUALITY:: ADEQUATE

## 2022-06-08 NOTE — Telephone Encounter (Signed)
Dr. Glori Bickers sent me a message saying:  Tower, [name], MD  Hickman, Rexene Agent M, CMA  Please let pt know that the bactrim can interact with her spironolactone. If possible hold the spironolactone until she is done with the bactrim for her uti.  Thanks

## 2022-06-08 NOTE — Telephone Encounter (Signed)
Patient returned your call,would like a call back on cell [phone].

## 2022-06-08 NOTE — Telephone Encounter (Signed)
Left VM requesting pt to call the office back 

## 2022-06-08 NOTE — Telephone Encounter (Signed)
Pt notified of Dr. Marliss Coots comments, she will hold aldactone until finished with abx

## 2022-06-13 NOTE — Telephone Encounter (Signed)
Pt called stated PCP was her to get lab done to recheck her thyroids . Please advise . Dose pt need a lab appointment

## 2022-06-13 NOTE — Telephone Encounter (Signed)
I ordered TSH Please schedule non fasting labs  Thanks

## 2022-06-13 NOTE — Addendum Note (Signed)
Addended by: Loura Pardon A on: 06/13/2022 04:42 PM   Modules accepted: Orders

## 2022-06-15 ENCOUNTER — Other Ambulatory Visit (INDEPENDENT_AMBULATORY_CARE_PROVIDER_SITE_OTHER): Payer: Medicare HMO

## 2022-06-15 DIAGNOSIS — E039 Hypothyroidism, unspecified: Secondary | ICD-10-CM | POA: Diagnosis not present

## 2022-06-15 LAB — TSH: TSH: 0.63 u[IU]/mL (ref 0.35–5.50)

## 2022-06-19 ENCOUNTER — Encounter (INDEPENDENT_AMBULATORY_CARE_PROVIDER_SITE_OTHER): Payer: Self-pay

## 2022-07-03 ENCOUNTER — Other Ambulatory Visit: Payer: Self-pay | Admitting: Family Medicine

## 2022-07-04 NOTE — Telephone Encounter (Signed)
CPE was on 03/13/22, last filled on 09/19/21 42.5g with 0 refills

## 2022-09-21 ENCOUNTER — Other Ambulatory Visit: Payer: Self-pay | Admitting: Student

## 2022-09-21 DIAGNOSIS — R42 Dizziness and giddiness: Secondary | ICD-10-CM

## 2022-10-05 ENCOUNTER — Ambulatory Visit
Admission: RE | Admit: 2022-10-05 | Discharge: 2022-10-05 | Disposition: A | Discharge status: Home / Self Care | Payer: Medicare HMO | Source: Ambulatory Visit | Attending: Student | Admitting: Student

## 2022-10-05 DIAGNOSIS — R42 Dizziness and giddiness: Secondary | ICD-10-CM | POA: Diagnosis present

## 2022-10-23 ENCOUNTER — Ambulatory Visit (INDEPENDENT_AMBULATORY_CARE_PROVIDER_SITE_OTHER): Payer: Medicare HMO

## 2022-10-23 ENCOUNTER — Ambulatory Visit: Payer: Medicare HMO | Attending: Cardiology | Admitting: Cardiology

## 2022-10-23 ENCOUNTER — Encounter: Payer: Self-pay | Admitting: Cardiology

## 2022-10-23 VITALS — BP 137/84 | HR 55 | Ht 65.0 in | Wt 131.4 lb

## 2022-10-23 DIAGNOSIS — R55 Syncope and collapse: Secondary | ICD-10-CM

## 2022-10-23 DIAGNOSIS — I10 Essential (primary) hypertension: Secondary | ICD-10-CM

## 2022-10-23 NOTE — Patient Instructions (Signed)
Medication Instructions:   Your physician recommends that you continue on your current medications as directed. Please refer to the Current Medication list given to you today.  *If you need a refill on your cardiac medications before your next appointment, please call your pharmacy*   Lab Work:  None Ordered  If you have labs (blood work) drawn today and your tests are completely normal, you will receive your results only by: [place] (if you have MyChart) OR A paper copy in the mail If you have any lab test that is abnormal or we need to change your treatment, we will call you to review the results.   Testing/Procedures: . Your physician has recommended that you wear a Zio monitor.   This monitor is a medical device that records the heart's electrical activity. Doctors most often use these monitors to diagnose arrhythmias. Arrhythmias are problems with the speed or rhythm of the heartbeat. The monitor is a small device applied to your chest. You can wear one while you do your normal daily activities. While wearing this monitor if you have any symptoms to push the button and record what you felt. Once you have worn this monitor for the period of time provider prescribed (Usually 14 days), you will return the monitor device in the postage paid box. Once it is returned they will download the data collected and provide [place] with a report which the provider will then review and we will call you with those results. Important tips:  Avoid showering during the first 24 hours of wearing the monitor. Avoid excessive sweating to help maximize wear time. Do not submerge the device, no hot tubs, and no swimming pools. Keep any lotions or oils away from the patch. After 24 hours you may shower with the patch on. Take brief showers with your back facing the shower head.  Do not remove patch once it has been placed because that will interrupt data and decrease adhesive wear time. Push the button  when you have any symptoms and write down what you were feeling. Once you have completed wearing your monitor, remove and place into box which has postage paid and place in your outgoing mailbox.  If for some reason you have misplaced your box then call our office and we can provide another box and/or mail it off for you.      Follow-Up: At [facility], you and your health needs are our priority.  As part of our continuing mission to provide you with exceptional heart care, we have created designated Provider Care Teams.  These Care Teams include your primary Cardiologist (physician) and Advanced Practice Providers (APPs -  Physician Assistants and Nurse Practitioners) who all work together to provide you with the care you need, when you need it.  We recommend signing up for the patient portal called "MyChart".  Sign up information is provided on this After Visit Summary.  MyChart is used to connect with patients for Virtual Visits (Telemedicine).  Patients are able to view lab/test results, encounter notes, upcoming appointments, etc.  Non-urgent messages can be sent to your provider as well.   To learn more about what you can do with MyChart, go to [email or web address].    Your next appointment:   3 month(s)  Provider:   You may see Kate Sable, MD or one of the following Advanced Practice Providers on your designated Care Team:   Murray Hodgkins, NP Christell Faith, PA-C Cadence Kathlen Mody, PA-C Gerrie Nordmann, NP

## 2022-10-23 NOTE — Progress Notes (Signed)
Cardiology Office Note:    Date:  10/23/2022   ID:  Sarah Harper, DOB 1941/06/21, MRN [id number]  PCP:  Abner Greenspan, MD  [facility] Cardiologist:  Kate Sable, MD  [facility] Electrophysiologist:  None   Referring MD: Abner Greenspan, MD   Chief Complaint  Patient presents with   Follow-up    6 month f/u, black out spells if looking down for extended periods not when changing positions    History of Present Illness:    Sarah Harper is a 82 y.o. female with a hx of hypertension, degenerative joint disease, GERD, CVA, venous insufficiency who presents due to presyncope.  She feels like she is going to pass out whenever she bends her head for extended periods of time.  States feeling the room becoming dark.  She usually sits down with resolution of symptoms.  Blood pressures are adequately controlled.  Checked her blood pressure during 1 instance, systolic was in the [id number].  Saw PCP and ENT, workup unrevealing.  Denies palpitations or chest pain.  Takes Lopressor once daily last evening dose usually makes her blood pressure drop to the low 100s.   Prior notes echocardiogram on 01/2015 due to syncope which showed normal systolic function, EF 60 to [id number], normal diastolic function. Medication adjustments were previously made.  She wanted to take less medications, as such irbesartan was started, Lopressor and Aldactone was stopped.  She complains of having elevated blood pressure with medication changes.  Her previous medications including Aldactone and Lopressor were restarted.  Norvasc was continued.  Past Medical History:  Diagnosis Date   Anxiety    Cataract 1997   Colon cancer screening 02/05/2018   DDD (degenerative disc disease)    chronic back pain   ETD (eustachian tube dysfunction)    GERD (gastroesophageal reflux disease)    Hypertension 2022   IBS (irritable bowel syndrome)    Meniere's disease    Migraine    still gets visual aura from time to time    Mild cognitive impairment    Osteoporosis    Sleep apnea    CPAP   Stroke ([facility]) 2022   Syncopal episodes    after work-up - possible seizures?   Thyroid disease    hypothyroid   TIA (transient ischemic attack) 11/09/2020    Past Surgical History:  Procedure Laterality Date   APPENDECTOMY     CATARACT EXTRACTION W/PHACO Right 05/21/2019   Procedure: CATARACT EXTRACTION PHACO AND INTRAOCULAR LENS PLACEMENT ([place]) RIGHT panoptix lens  00:35.0  21.7%  7.61;  Surgeon: Leandrew Koyanagi, MD;  Location: [place];  Service: Ophthalmology;  Laterality: Right;  sleep apnea requests early   CATARACT EXTRACTION W/PHACO Left 06/11/2019   Procedure: CATARACT EXTRACTION PHACO AND INTRAOCULAR LENS PLACEMENT ([facility]) LEFT PANOPTIX TORIC LENS  00:50.0  20.3%  10.21;  Surgeon: Leandrew Koyanagi, MD;  Location: [place];  Service: Ophthalmology;  Laterality: Left;  sleep apnea requests early   COLONOSCOPY     last 2012 - [facility]   ESOPHAGOGASTRODUODENOSCOPY     Multiple, last 01/07/2017 with Savary dilation to 18 mm   HEMORRHOID BANDING  2018   [facility]   LUMBAR DISC SURGERY  1984   L5     Current Medications: Current Meds  Medication Sig   ALPRAZolam (XANAX) 0.25 MG tablet TAKE 1 TABLET BY MOUTH TWICE A DAY AS NEEDED FOR ANXIETY (CAUSES SEDATION)   AMBULATORY NON FORMULARY MEDICATION Take 35 mg by mouth in the  morning and at bedtime. CBD drops 30 mg oral at breakfast and 25 mg oral at bedtime   amLODipine (NORVASC) 5 MG tablet Take 1 tablet (5 mg total) by mouth daily. Take 1/2 daily   aspirin EC 81 MG EC tablet Take 1 tablet (81 mg total) by mouth daily. Swallow whole.   B Complex-C (SUPER B COMPLEX PO) Take 1 tablet by mouth daily.   butalbital-acetaminophen-caffeine (FIORICET) 50-325-40 MG tablet Take 1 tablet by mouth every 6 (six) hours as needed.   cholecalciferol (VITAMIN D) 25 MCG (1000 UNIT) tablet Take 1,000 Units by mouth daily.   diclofenac sodium (VOLTAREN) 1 %  GEL APPLY 2 GRAMS TOPICALLY FOUR TIMES A DAY TO AFFECTED AREAS   estradiol (ESTRACE) 0.1 MG/GM vaginal cream INSERT SMALL AMOUNT (1 CM) VAGINALLY EVERY OTHER DAY   levothyroxine (SYNTHROID) 75 MCG tablet Take 1 tablet (75 mcg total) by mouth daily.   linaclotide (LINZESS) 145 MCG CAPS capsule Take 1 capsule (145 mcg total) by mouth as needed.   metoprolol tartrate (LOPRESSOR) 25 MG tablet Take 12.5 mg by mouth daily.   mometasone (NASONEX) 50 MCG/ACT nasal spray Place 2 sprays into the nose daily as needed.   omeprazole (PRILOSEC) 40 MG capsule Take 1 capsule (40 mg total) by mouth daily.   spironolactone (ALDACTONE) 25 MG tablet Take 0.5 tablets (12.5 mg total) by mouth as needed. (Patient taking differently: Take 12.5 mg by mouth daily.)   sucralfate (CARAFATE) 1 g tablet Take 1 g by mouth 4 (four) times daily as needed.     Allergies:   Nsaids and Tolmetin   Social History   Socioeconomic History   Marital status: Married    Spouse name: Sarah Harper   Number of children: 0   Years of education: Not on file   Highest education level: Not on file  Occupational History   Occupation: Retired  Tobacco Use   Smoking status: Never    Passive exposure: Never   Smokeless tobacco: Never  Vaping Use   Vaping Use: Never used  Substance and Sexual Activity   Alcohol use: Yes    Comment: Average 1 glass of wine/week   Drug use: Yes    Types: Marijuana    Comment: CBD drops   Sexual activity: Yes    Birth control/protection: Post-menopausal  Other Topics Concern   Not on file  Social History Narrative   Married, husband is a type I diabetic.  No children.   Elderly mother in her 23s lives in an assisted living facility.   Very occasional white wine with dinner, never smoker no drug use.  Limited caffeine.   Social Determinants of Health   Financial Resource Strain: Low Risk  (12/21/2021)   Overall Financial Resource Strain (CARDIA)    Difficulty of Paying Living Expenses: Not hard at all   Food Insecurity: No Food Insecurity (12/21/2021)   Hunger Vital Sign    Worried About Running Out of Food in the Last Year: Never true    Ran Out of Food in the Last Year: Never true  Transportation Needs: No Transportation Needs (12/21/2021)   PRAPARE - Hydrologist (Medical): No    Lack of Transportation (Non-Medical): No  Physical Activity: Insufficiently Active (12/21/2021)   Exercise Vital Sign    Days of Exercise per Week: 7 days    Minutes of Exercise per Session: 10 min  Stress: No Stress Concern Present (12/21/2021)   [employer] -  Occupational Stress Questionnaire    Feeling of Stress : Only a little  Social Connections: Moderately Integrated (12/21/2021)   Social Connection and Isolation Panel [NHANES]    Frequency of Communication with Friends and Family: Twice a week    Frequency of Social Gatherings with Friends and Family: Twice a week    Attends Religious Services: Never    Marine scientist or Organizations: Yes    Attends Music therapist: More than 4 times per year    Marital Status: Married     Family History: The patient's family history includes Breast cancer in her paternal aunt; Diabetes in her paternal aunt; Hypertension in her maternal grandfather and maternal grandmother.  ROS:   Please see the history of present illness.     All other systems reviewed and are negative.  EKGs/Labs/Other Studies Reviewed:    The following studies were reviewed today:   EKG:  EKG is  ordered today.  The ekg ordered today demonstrates sinus bradycardia with first-degree AV block, heart rate 55.  Recent Labs: 03/08/2022: ALT 18; BUN 11; Creatinine, Ser 0.69; Hemoglobin 12.7; Platelets 248.0; Potassium 3.9; Sodium 137 06/15/2022: TSH 0.63  Recent Lipid Panel    Component Value Date/Time   CHOL 181 03/08/2022 0819   CHOL 119 12/08/2020 1542   TRIG 63.0 03/08/2022 0819   HDL 67.50 03/08/2022 0819    HDL 74 12/08/2020 1542   CHOLHDL 3 03/08/2022 0819   VLDL 12.6 03/08/2022 0819   LDLCALC 101 (H) 03/08/2022 0819   LDLCALC 33 12/08/2020 1542   LDLDIRECT 115.5 02/12/2013 0755    Physical Exam:    VS:  BP 137/84 (BP Location: Left Arm, Patient Position: Sitting, Cuff Size: Normal)   Pulse (!) 55   Ht '5\' 5"'$  (1.651 m)   Wt 131 lb 6.4 oz (59.6 kg)   SpO2 100%   BMI 21.87 kg/m     Wt Readings from Last 3 Encounters:  10/23/22 131 lb 6.4 oz (59.6 kg)  06/05/22 129 lb (58.5 kg)  03/27/22 129 lb (58.5 kg)     GEN:  Well nourished, well developed in no acute distress HEENT: Normal NECK: No JVD; No carotid bruits CARDIAC: RRR, no murmur RESPIRATORY:  Clear to auscultation without rales, wheezing or rhonchi  ABDOMEN: Soft, non-tender, non-distended MUSCULOSKELETAL: no edema; bilateral varicose veins noted SKIN: Warm and dry NEUROLOGIC:  Alert and oriented x 3 PSYCHIATRIC:  Normal affect   ASSESSMENT:    1. Primary hypertension   2. Pre-syncope    PLAN:    In order of problems listed above:  Hypertension,  BP is controlled.  Continue Lopressor, Aldactone, Norvasc.   Presyncope, appears vasovagal, or current when patient bends head for extended periods of time.  Place cardiac monitor to rule out any significant arrhythmias.  Extended head bending discouraged.  Safety precautions/sitting when prodromal symptoms arise advised.  Follow-up after cardiac monitor.   Medication Adjustments/Labs and Tests Ordered: Current medicines are reviewed at length with the patient today.  Concerns regarding medicines are outlined above.  Orders Placed This Encounter  Procedures   LONG TERM MONITOR (3-14 DAYS)   EKG 12-Lead      No orders of the defined types were placed in this encounter.     Patient Instructions  Medication Instructions:   Your physician recommends that you continue on your current medications as directed. Please refer to the Current Medication list given to  you today.  *If you need a refill on  your cardiac medications before your next appointment, please call your pharmacy*   Lab Work:  None Ordered  If you have labs (blood work) drawn today and your tests are completely normal, you will receive your results only by: [place] (if you have MyChart) OR A paper copy in the mail If you have any lab test that is abnormal or we need to change your treatment, we will call you to review the results.   Testing/Procedures: . Your physician has recommended that you wear a Zio monitor.   This monitor is a medical device that records the heart's electrical activity. Doctors most often use these monitors to diagnose arrhythmias. Arrhythmias are problems with the speed or rhythm of the heartbeat. The monitor is a small device applied to your chest. You can wear one while you do your normal daily activities. While wearing this monitor if you have any symptoms to push the button and record what you felt. Once you have worn this monitor for the period of time provider prescribed (Usually 14 days), you will return the monitor device in the postage paid box. Once it is returned they will download the data collected and provide [place] with a report which the provider will then review and we will call you with those results. Important tips:  Avoid showering during the first 24 hours of wearing the monitor. Avoid excessive sweating to help maximize wear time. Do not submerge the device, no hot tubs, and no swimming pools. Keep any lotions or oils away from the patch. After 24 hours you may shower with the patch on. Take brief showers with your back facing the shower head.  Do not remove patch once it has been placed because that will interrupt data and decrease adhesive wear time. Push the button when you have any symptoms and write down what you were feeling. Once you have completed wearing your monitor, remove and place into box which has postage paid and  place in your outgoing mailbox.  If for some reason you have misplaced your box then call our office and we can provide another box and/or mail it off for you.      Follow-Up: At [facility], you and your health needs are our priority.  As part of our continuing mission to provide you with exceptional heart care, we have created designated Provider Care Teams.  These Care Teams include your primary Cardiologist (physician) and Advanced Practice Providers (APPs -  Physician Assistants and Nurse Practitioners) who all work together to provide you with the care you need, when you need it.  We recommend signing up for the patient portal called "MyChart".  Sign up information is provided on this After Visit Summary.  MyChart is used to connect with patients for Virtual Visits (Telemedicine).  Patients are able to view lab/test results, encounter notes, upcoming appointments, etc.  Non-urgent messages can be sent to your provider as well.   To learn more about what you can do with MyChart, go to [email or web address].    Your next appointment:   3 month(s)  Provider:   You may see Kate Sable, MD or one of the following Advanced Practice Providers on your designated Care Team:   Murray Hodgkins, NP Christell Faith, PA-C Cadence Kathlen Mody, PA-C Gerrie Nordmann, NP \ Signed, Kate Sable, MD  10/23/2022 12:52 PM    [place]

## 2022-10-26 DIAGNOSIS — R55 Syncope and collapse: Secondary | ICD-10-CM | POA: Diagnosis not present

## 2022-12-25 ENCOUNTER — Ambulatory Visit (INDEPENDENT_AMBULATORY_CARE_PROVIDER_SITE_OTHER): Payer: Medicare HMO

## 2022-12-25 VITALS — Ht 66.0 in | Wt 130.0 lb

## 2022-12-25 DIAGNOSIS — Z0000 Encounter for general adult medical examination without abnormal findings: Secondary | ICD-10-CM | POA: Diagnosis not present

## 2022-12-25 NOTE — Patient Instructions (Signed)
Sarah Harper , Thank you for taking time to come for your Medicare Wellness Visit. I appreciate your ongoing commitment to your health goals. Please review the following plan we discussed and let me know if I can assist you in the future.   These are the goals we discussed:  Goals      Patient Stated     12/21/2021 - I will continue to do stretching exercises everyday for about 15 minutes.      Patient Stated     No new goals        This is a list of the screening recommended for you and due dates:  Health Maintenance  Topic Date Due   DTaP/Tdap/Td vaccine (3 - Tdap) 12/21/2021   COVID-19 Vaccine (5 - 2023-24 season) 04/21/2022   Mammogram  11/10/2022   Flu Shot  03/22/2023   Medicare Annual Wellness Visit  12/25/2023   DEXA scan (bone density measurement)  04/28/2025   Pneumonia Vaccine  Completed   Zoster (Shingles) Vaccine  Completed   HPV Vaccine  Aged Out    Advanced directives: Please bring a copy of your health care power of attorney and living will to the office to be added to your chart at your convenience.   Conditions/risks identified: Aim for 30 minutes of exercise or brisk walking, 6-8 glasses of water, and 5 servings of fruits and vegetables each day.   Next appointment: Follow up in one year for your annual wellness visit 12/26/23 @ 1:30 televisit   Preventive Care 65 Years and Older, Female Preventive care refers to lifestyle choices and visits with your health care provider that can promote health and wellness. What does preventive care include? A yearly physical exam. This is also called an annual well check. Dental exams once or twice a year. Routine eye exams. Ask your health care provider how often you should have your eyes checked. Personal lifestyle choices, including: Daily care of your teeth and gums. Regular physical activity. Eating a healthy diet. Avoiding tobacco and drug use. Limiting alcohol use. Practicing safe sex. Taking low-dose aspirin  every day. Taking vitamin and mineral supplements as recommended by your health care provider. What happens during an annual well check? The services and screenings done by your health care provider during your annual well check will depend on your age, overall health, lifestyle risk factors, and family history of disease. Counseling  Your health care provider may ask you questions about your: Alcohol use. Tobacco use. Drug use. Emotional well-being. Home and relationship well-being. Sexual activity. Eating habits. History of falls. Memory and ability to understand (cognition). Work and work Astronomer. Reproductive health. Screening  You may have the following tests or measurements: Height, weight, and BMI. Blood pressure. Lipid and cholesterol levels. These may be checked every 5 years, or more frequently if you are over 79 years old. Skin check. Lung cancer screening. You may have this screening every year starting at age 9 if you have a 30-pack-year history of smoking and currently smoke or have quit within the past 15 years. Fecal occult blood test (FOBT) of the stool. You may have this test every year starting at age 85. Flexible sigmoidoscopy or colonoscopy. You may have a sigmoidoscopy every 5 years or a colonoscopy every 10 years starting at age 75. Hepatitis C blood test. Hepatitis B blood test. Sexually transmitted disease (STD) testing. Diabetes screening. This is done by checking your blood sugar (glucose) after you have not eaten for a while (fasting).  You may have this done every 1-3 years. Bone density scan. This is done to screen for osteoporosis. You may have this done starting at age 29. Mammogram. This may be done every 1-2 years. Talk to your health care provider about how often you should have regular mammograms. Talk with your health care provider about your test results, treatment options, and if necessary, the need for more tests. Vaccines  Your health  care provider may recommend certain vaccines, such as: Influenza vaccine. This is recommended every year. Tetanus, diphtheria, and acellular pertussis (Tdap, Td) vaccine. You may need a Td booster every 10 years. Zoster vaccine. You may need this after age 87. Pneumococcal 13-valent conjugate (PCV13) vaccine. One dose is recommended after age 43. Pneumococcal polysaccharide (PPSV23) vaccine. One dose is recommended after age 14. Talk to your health care provider about which screenings and vaccines you need and how often you need them. This information is not intended to replace advice given to you by your health care provider. Make sure you discuss any questions you have with your health care provider. Document Released: 09/03/2015 Document Revised: 04/26/2016 Document Reviewed: 06/08/2015 Elsevier Interactive Patient Education  2017 [employer] Prevention in the Home Falls can cause injuries. They can happen to people of all ages. There are many things you can do to make your home safe and to help prevent falls. What can I do on the outside of my home? Regularly fix the edges of walkways and driveways and fix any cracks. Remove anything that might make you trip as you walk through a door, such as a raised step or threshold. Trim any bushes or trees on the path to your home. Use bright outdoor lighting. Clear any walking paths of anything that might make someone trip, such as rocks or tools. Regularly check to see if handrails are loose or broken. Make sure that both sides of any steps have handrails. Any raised decks and porches should have guardrails on the edges. Have any leaves, snow, or ice cleared regularly. Use sand or salt on walking paths during winter. Clean up any spills in your garage right away. This includes oil or grease spills. What can I do in the bathroom? Use night lights. Install grab bars by the toilet and in the tub and shower. Do not use towel bars as grab  bars. Use non-skid mats or decals in the tub or shower. If you need to sit down in the shower, use a plastic, non-slip stool. Keep the floor dry. Clean up any water that spills on the floor as soon as it happens. Remove soap buildup in the tub or shower regularly. Attach bath mats securely with double-sided non-slip rug tape. Do not have throw rugs and other things on the floor that can make you trip. What can I do in the bedroom? Use night lights. Make sure that you have a light by your bed that is easy to reach. Do not use any sheets or blankets that are too big for your bed. They should not hang down onto the floor. Have a firm chair that has side arms. You can use this for support while you get dressed. Do not have throw rugs and other things on the floor that can make you trip. What can I do in the kitchen? Clean up any spills right away. Avoid walking on wet floors. Keep items that you use a lot in easy-to-reach places. If you need to reach something above you, use a  strong step stool that has a grab bar. Keep electrical cords out of the way. Do not use floor polish or wax that makes floors slippery. If you must use wax, use non-skid floor wax. Do not have throw rugs and other things on the floor that can make you trip. What can I do with my stairs? Do not leave any items on the stairs. Make sure that there are handrails on both sides of the stairs and use them. Fix handrails that are broken or loose. Make sure that handrails are as long as the stairways. Check any carpeting to make sure that it is firmly attached to the stairs. Fix any carpet that is loose or worn. Avoid having throw rugs at the top or bottom of the stairs. If you do have throw rugs, attach them to the floor with carpet tape. Make sure that you have a light switch at the top of the stairs and the bottom of the stairs. If you do not have them, ask someone to add them for you. What else can I do to help prevent  falls? Wear shoes that: Do not have high heels. Have rubber bottoms. Are comfortable and fit you well. Are closed at the toe. Do not wear sandals. If you use a stepladder: Make sure that it is fully opened. Do not climb a closed stepladder. Make sure that both sides of the stepladder are locked into place. Ask someone to hold it for you, if possible. Clearly mark and make sure that you can see: Any grab bars or handrails. First and last steps. Where the edge of each step is. Use tools that help you move around (mobility aids) if they are needed. These include: Canes. Walkers. Scooters. Crutches. Turn on the lights when you go into a dark area. Replace any light bulbs as soon as they burn out. Set up your furniture so you have a clear path. Avoid moving your furniture around. If any of your floors are uneven, fix them. If there are any pets around you, be aware of where they are. Review your medicines with your doctor. Some medicines can make you feel dizzy. This can increase your chance of falling. Ask your doctor what other things that you can do to help prevent falls. This information is not intended to replace advice given to you by your health care provider. Make sure you discuss any questions you have with your health care provider. Document Released: 06/03/2009 Document Revised: 01/13/2016 Document Reviewed: 09/11/2014 Elsevier Interactive Patient Education  2017 [employer].

## 2022-12-25 NOTE — Progress Notes (Signed)
I connected with  Hooria L Arganbright on 12/25/22 by a audio enabled telemedicine application and verified that I am speaking with the correct person using two identifiers.  Patient Location: Home  Provider Location: Home Office  I discussed the limitations of evaluation and management by telemedicine. The patient expressed understanding and agreed to proceed.  Subjective:   UMAIMA GEHMAN is a 82 y.o. female who presents for Medicare Annual (Subsequent) preventive examination.  Review of Systems      Cardiac Risk Factors include: advanced age (>78men, >23 women);hypertension;sedentary lifestyle     Objective:    Today's Vitals   12/25/22 1250 12/25/22 1251  Weight: 130 lb (59 kg)   Height: 5\' 6"  (1.676 m)   PainSc:  5    Body mass index is 20.98 kg/m.     12/25/2022    1:05 PM 12/21/2021   12:10 PM 12/04/2020    7:46 AM 11/10/2020    2:54 PM 11/08/2020    1:20 PM 03/19/2020    2:04 PM 06/11/2019   11:51 AM  Advanced Directives  Does Patient Have a Medical Advance Directive? Yes Yes Yes Yes Yes Yes Yes  Type of Estate agent of [place];Living will Healthcare Power of [place];Living will Healthcare Power of [employer] of [place];Living will Healthcare Power of [place];Living will Healthcare Power of [place];Living will Healthcare Power of [place];Living will  Does patient want to make changes to medical advance directive?    No - Patient declined No - Patient declined  No - Patient declined  Copy of Healthcare Power of Attorney in Chart? No - copy requested No - copy requested  No - copy requested No - copy requested No - copy requested No - copy requested  Would patient like information on creating a medical advance directive?    No - Patient declined No - Patient declined      Current Medications (verified) Outpatient Encounter Medications as of 12/25/2022  Medication Sig   ALPRAZolam (XANAX) 0.25 MG tablet TAKE 1 TABLET BY MOUTH TWICE A  DAY AS NEEDED FOR ANXIETY (CAUSES SEDATION)   AMBULATORY NON FORMULARY MEDICATION Take 35 mg by mouth in the morning and at bedtime. CBD drops 30 mg oral at breakfast and 25 mg oral at bedtime   amLODipine (NORVASC) 5 MG tablet Take 1 tablet (5 mg total) by mouth daily. Take 1/2 daily   aspirin EC 81 MG EC tablet Take 1 tablet (81 mg total) by mouth daily. Swallow whole.   B Complex-C (SUPER B COMPLEX PO) Take 1 tablet by mouth daily.   butalbital-acetaminophen-caffeine (FIORICET) 50-325-40 MG tablet Take 1 tablet by mouth every 6 (six) hours as needed.   cholecalciferol (VITAMIN D) 25 MCG (1000 UNIT) tablet Take 1,000 Units by mouth daily.   diclofenac sodium (VOLTAREN) 1 % GEL APPLY 2 GRAMS TOPICALLY FOUR TIMES A DAY TO AFFECTED AREAS   estradiol (ESTRACE) 0.1 MG/GM vaginal cream INSERT SMALL AMOUNT (1 CM) VAGINALLY EVERY OTHER DAY   levothyroxine (SYNTHROID) 75 MCG tablet Take 1 tablet (75 mcg total) by mouth daily.   linaclotide (LINZESS) 145 MCG CAPS capsule Take 1 capsule (145 mcg total) by mouth as needed.   metoprolol tartrate (LOPRESSOR) 25 MG tablet Take 12.5 mg by mouth daily.   mometasone (NASONEX) 50 MCG/ACT nasal spray Place 2 sprays into the nose daily as needed.   omeprazole (PRILOSEC) 40 MG capsule Take 1 capsule (40 mg total) by mouth daily.   spironolactone (ALDACTONE) 25 MG  tablet Take 0.5 tablets (12.5 mg total) by mouth as needed. (Patient taking differently: Take 12.5 mg by mouth daily.)   sucralfate (CARAFATE) 1 g tablet Take 1 g by mouth 4 (four) times daily as needed.   calcium elemental as carbonate (BARIATRIC TUMS ULTRA) 400 MG chewable tablet Chew 1,000 mg by mouth daily. (Patient not taking: Reported on 12/25/2022)   sulfamethoxazole-trimethoprim (BACTRIM DS) 800-160 MG tablet Take 1 tablet by mouth 2 (two) times daily. (Patient not taking: Reported on 10/23/2022)   No facility-administered encounter medications on file as of 12/25/2022.    Allergies (verified) Nsaids  and Tolmetin   History: Past Medical History:  Diagnosis Date   Anxiety    Cataract 1997   Colon cancer screening 02/05/2018   DDD (degenerative disc disease)    chronic back pain   ETD (eustachian tube dysfunction)    GERD (gastroesophageal reflux disease)    Hypertension 2022   IBS (irritable bowel syndrome)    Meniere's disease    Migraine    still gets visual aura from time to time   Mild cognitive impairment    Osteoporosis    Sleep apnea    CPAP   Stroke (HCC) 2022   Syncopal episodes    after work-up - possible seizures?   Thyroid disease    hypothyroid   TIA (transient ischemic attack) 11/09/2020   Past Surgical History:  Procedure Laterality Date   APPENDECTOMY     CATARACT EXTRACTION W/PHACO Right 05/21/2019   Procedure: CATARACT EXTRACTION PHACO AND INTRAOCULAR LENS PLACEMENT (IOC) RIGHT panoptix lens  00:35.0  21.7%  7.61;  Surgeon: Lockie Mola, MD;  Location[facility];  Service: Ophthalmology;  Laterality: Right;  sleep apnea requests early   CATARACT EXTRACTION W/PHACO Left 06/11/2019   Procedure: CATARACT EXTRACTION PHACO AND INTRAOCULAR LENS PLACEMENT (IOC) LEFT PANOPTIX TORIC LENS  00:50.0  20.3%  10.21;  Surgeon: Lockie Mola, MD;  Location: [facility];  Service: Ophthalmology;  Laterality: Left;  sleep apnea requests early   COLONOSCOPY     last 2012 - [facility]   ESOPHAGOGASTRODUODENOSCOPY     Multiple, last 01/07/2017 with Savary dilation to 18 mm   HEMORRHOID BANDING  2018   [facility]   LUMBAR DISC SURGERY  1984   L5    Family History  Problem Relation Age of Onset   Diabetes Paternal Aunt    Breast cancer Paternal Aunt    Hypertension Maternal Grandmother    Hypertension Maternal Grandfather    Social History   Socioeconomic History   Marital status: Married    Spouse name: Erland   Number of children: 0   Years of education: Not on file   Highest education level: Not on file  Occupational History    Occupation: Retired  Tobacco Use   Smoking status: Never    Passive exposure: Never   Smokeless tobacco: Never  Vaping Use   Vaping Use: Never used  Substance and Sexual Activity   Alcohol use: Yes    Comment: Average 1 glass of wine/week   Drug use: Yes    Types: Marijuana    Comment: CBD drops   Sexual activity: Yes    Birth control/protection: Post-menopausal  Other Topics Concern   Not on file  Social History Narrative   Married, husband is a type I diabetic.  No children.   Elderly mother in her 90s lives in an assisted living facility.   Very occasional white wine with dinner, never smoker no  drug use.  Limited caffeine.   Social Determinants of Health   Financial Resource Strain: Low Risk  (12/25/2022)   Overall Financial Resource Strain (CARDIA)    Difficulty of Paying Living Expenses: Not hard at all  Food Insecurity: No Food Insecurity (12/25/2022)   Hunger Vital Sign    Worried About Running Out of Food in the Last Year: Never true    Ran Out of Food in the Last Year: Never true  Transportation Needs: No Transportation Needs (12/25/2022)   PRAPARE - Administrator, Civil Service (Medical): No    Lack of Transportation (Non-Medical): No  Physical Activity: Insufficiently Active (12/25/2022)   Exercise Vital Sign    Days of Exercise per Week: 7 days    Minutes of Exercise per Session: 20 min  Stress: No Stress Concern Present (12/25/2022)   [employer] of Occupational Health - Occupational Stress Questionnaire    Feeling of Stress : Not at all  Social Connections: Moderately Isolated (12/25/2022)   Social Connection and Isolation Panel [NHANES]    Frequency of Communication with Friends and Family: More than three times a week    Frequency of Social Gatherings with Friends and Family: More than three times a week    Attends Religious Services: Never    Database administrator or Organizations: No    Attends Engineer, structural: Never    Marital  Status: Married    Tobacco Counseling Counseling given: Not Answered   Clinical Intake:  Pre-visit preparation completed: Yes  Pain : 0-10 Pain Score: 5  Pain Type: Chronic pain Pain Location: Generalized Pain Descriptors / Indicators: Aching, Sharp Pain Onset: Other (comment) (Years) Pain Frequency: Intermittent     Nutritional Risks: None Diabetes: No  How often do you need to have someone help you when you read instructions, pamphlets, or other written materials from your doctor or pharmacy?: 1 - Never  Diabetic? no  Interpreter Needed?: No  Information entered by :: C.Lagena Strand LPN   Activities of Daily Living    12/25/2022    1:07 PM  In your present state of health, do you have any difficulty performing the following activities:  Hearing? 0  Vision? 0  Difficulty concentrating or making decisions? 1  Comment Occasionally  Walking or climbing stairs? 0  Dressing or bathing? 0  Doing errands, shopping? 0  Preparing Food and eating ? N  Using the Toilet? N  In the past six months, have you accidently leaked urine? N  Do you have problems with loss of bowel control? N  Managing your Medications? N  Managing your Finances? N  Housekeeping or managing your Housekeeping? N    Patient Care Team: Tower, Audrie Gallus, MD as PCP - General Debbe Odea, MD as PCP - Cardiology (Cardiology) Lennon Alstrom, PA-C as Physician Assistant (Cardiology)  Indicate any recent Medical Services you may have received from other than [facility] providers in the past year (date may be approximate).     Assessment:   This is a routine wellness examination for Cammy.  Hearing/Vision screen Hearing Screening - Comments:: No Aids Vision Screening - Comments:: None - [facility]  Dietary issues and exercise activities discussed: Current Exercise Habits: Home exercise routine, Type of exercise: stretching, Time (Minutes): 20, Frequency (Times/Week): 7, Weekly Exercise  (Minutes/Week): 140, Intensity: Mild, Exercise limited by: Other - see comments (Back pain)   Goals Addressed             This  Visit's Progress    Patient Stated       No new goals       Depression Screen    12/25/2022    1:03 PM 12/21/2021   12:08 PM 03/19/2020    2:05 PM 03/07/2019   10:21 AM 02/05/2018    3:22 PM 03/06/2016    8:10 AM 03/05/2015   10:10 AM  PHQ 2/9 Scores  PHQ - 2 Score 0 0 2 0 0 0 0  PHQ- 9 Score   2        Fall Risk    12/25/2022    1:07 PM 12/21/2021   12:06 PM 03/19/2020    2:05 PM 03/07/2019   10:20 AM 02/05/2018    3:22 PM  Fall Risk   Falls in the past year? 0 0 1 0 No  Number falls in past yr: 0 0 0 0   Injury with Fall? 0 0 0 0   Risk for fall due to : No Fall Risks Orthopedic patient Medication side effect    Follow up Falls prevention discussed;Falls evaluation completed;Education provided Falls prevention discussed Falls evaluation completed;Falls prevention discussed      FALL RISK PREVENTION PERTAINING TO THE HOME:  Any stairs in or around the home? Yes  If so, are there any without handrails? No  Home free of loose throw rugs in walkways, pet beds, electrical cords, etc? Yes  Adequate lighting in your home to reduce risk of falls? Yes   ASSISTIVE DEVICES UTILIZED TO PREVENT FALLS:  Life alert? No  Use of a cane, walker or w/c? No  Grab bars in the bathroom? Yes  Shower chair or bench in shower? No  Elevated toilet seat or a handicapped toilet? Yes    Cognitive Function:    03/19/2020    2:08 PM  MMSE - Mini Mental State Exam  Orientation to time 5  Orientation to Place 5  Registration 3  Attention/ Calculation 5  Recall 3  Language- repeat 1        12/25/2022    1:08 PM 12/21/2021   12:11 PM  6CIT Screen  What Year? 0 points 0 points  What month? 0 points 0 points  What time? 0 points 0 points  Count back from 20 0 points 0 points  Months in reverse 0 points 0 points  Repeat phrase 2 points 2 points  Total Score 2  points 2 points    Immunizations Immunization History  Administered Date(s) Administered   Fluad Quad(high Dose 65+) 05/07/2019, 05/22/2020, 05/27/2021, 05/17/2022   Influenza Whole 05/22/2007, 05/21/2009   Influenza,inj,Quad PF,6+ Mos 06/11/2013, 05/28/2014, 05/14/2015, 05/12/2016, 05/10/2017, 05/16/2018   PFIZER(Purple Top)SARS-COV-2 Vaccination 09/12/2019, 10/03/2019, 04/24/2020   Pfizer Covid-19 Vaccine Bivalent Booster 66yrs & up 06/17/2021   Pneumococcal Conjugate-13 02/24/2014   Pneumococcal Polysaccharide-23 12/22/2011   Td 12/19/2001, 12/22/2011   Zoster Recombinat (Shingrix) 08/20/2021, 12/14/2021   Zoster, Live 11/12/2012    TDAP status: Due, Education has been provided regarding the importance of this vaccine. Advised may receive this vaccine at local pharmacy or Health Dept. Aware to provide a copy of the vaccination record if obtained from local pharmacy or Health Dept. Verbalized acceptance and understanding.  Flu Vaccine status: Up to date  Pneumococcal vaccine status: Up to date  Covid-19 vaccine status: Information provided on how to obtain vaccines.   Qualifies for Shingles Vaccine? Yes   Zostavax completed Yes   Shingrix Completed?: Yes  Screening Tests Health Maintenance  Topic Date  Due   DTaP/Tdap/Td (3 - Tdap) 12/21/2021   COVID-19 Vaccine (5 - 2023-24 season) 04/21/2022   MAMMOGRAM  11/10/2022   INFLUENZA VACCINE  03/22/2023   Medicare Annual Wellness (AWV)  12/25/2023   DEXA SCAN  04/28/2025   Pneumonia Vaccine 73+ Years old  Completed   Zoster Vaccines- Shingrix  Completed   HPV VACCINES  Aged Out    Health Maintenance  Health Maintenance Due  Topic Date Due   DTaP/Tdap/Td (3 - Tdap) 12/21/2021   COVID-19 Vaccine (5 - 2023-24 season) 04/21/2022   MAMMOGRAM  11/10/2022    Colorectal cancer screening: No longer required.   Mammogram - Completed 11/09/21 pt will call to schedule.  Bone Density status: Completed 04/28/2022. Results  reflect: Bone density results: OSTEOPENIA. Repeat every 2 years.  Lung Cancer Screening: (Low Dose CT Chest recommended if Age 60-80 years, 30 pack-year currently smoking OR have quit w/in 15years.) does not qualify.   Lung Cancer Screening Referral: no  Additional Screening:  Hepatitis C Screening: does not qualify; Completed no  Vision Screening: Recommended annual ophthalmology exams for early detection of glaucoma and other disorders of the eye. Is the patient up to date with their annual eye exam?  Yes  Who is the provider or what is the name of the office in which the patient attends annual eye exams? [facility] If pt is not established with a provider, would they like to be referred to a provider to establish care? Yes .   Dental Screening: Recommended annual dental exams for proper oral hygiene  Community Resource Referral / Chronic Care Management: CRR required this visit?  No   CCM required this visit?  No      Plan:     I have personally reviewed and noted the following in the patient's chart:   Medical and social history Use of alcohol, tobacco or illicit drugs  Current medications and supplements including opioid prescriptions. Patient is not currently taking opioid prescriptions. Functional ability and status Nutritional status Physical activity Advanced directives List of other physicians Hospitalizations, surgeries, and ER visits in previous 12 months Vitals Screenings to include cognitive, depression, and falls Referrals and appointments  In addition, I have reviewed and discussed with patient certain preventive protocols, quality metrics, and best practice recommendations. A written personalized care plan for preventive services as well as general preventive health recommendations were provided to patient.     Maryan Puls, LPN   4/0/9811   Nurse Notes: none

## 2022-12-26 ENCOUNTER — Ambulatory Visit (INDEPENDENT_AMBULATORY_CARE_PROVIDER_SITE_OTHER): Payer: Medicare HMO | Admitting: Family Medicine

## 2022-12-26 ENCOUNTER — Encounter: Payer: Self-pay | Admitting: Family Medicine

## 2022-12-26 VITALS — BP 122/68 | HR 72 | Temp 97.8°F | Ht 66.0 in | Wt 129.2 lb

## 2022-12-26 DIAGNOSIS — S81812A Laceration without foreign body, left lower leg, initial encounter: Secondary | ICD-10-CM | POA: Diagnosis not present

## 2022-12-26 DIAGNOSIS — D692 Other nonthrombocytopenic purpura: Secondary | ICD-10-CM | POA: Insufficient documentation

## 2022-12-26 DIAGNOSIS — S81811A Laceration without foreign body, right lower leg, initial encounter: Secondary | ICD-10-CM | POA: Insufficient documentation

## 2022-12-26 DIAGNOSIS — S81819A Laceration without foreign body, unspecified lower leg, initial encounter: Secondary | ICD-10-CM | POA: Insufficient documentation

## 2022-12-26 NOTE — Assessment & Plan Note (Addendum)
Due to think skin and exacerbated with low dose asa daily  Discussed use of care with skin/not to traumatize  Moisturize and protect from sun and friction when able  Continue regular dermatology care

## 2022-12-26 NOTE — Assessment & Plan Note (Signed)
Overlying area of purpura  No signs of infection Recommended soap and water cleanse gently  Abx oint otc prn   Update if not starting to improve in a week or if worsening

## 2022-12-26 NOTE — Patient Instructions (Addendum)
Keep the area on your leg clean with soap and water (gently)  You can use any over the counter antibiotic ointments to prevent infection  Let us know if it does not heal   Be gentle with your skin  Use moisturizer and also continue sun protection   Take care of yourself!

## 2022-12-26 NOTE — Progress Notes (Signed)
Subjective:    Patient ID: Sarah Harper, female    DOB: [birth year], 82 y.o.   MRN: [id number]  HPI Pt presents for skin spots, worse on L leg  Wt Readings from Last 3 Encounters:  12/26/22 129 lb 4 oz (58.6 kg)  12/25/22 130 lb (59 kg)  10/23/22 131 lb 6.4 oz (59.6 kg)   20.86 kg/m  Vitals:   12/26/22 1351  BP: 122/68  Pulse: 72  Temp: 97.8 F (36.6 C)  SpO2: 96%    Often gets red spots on skin  One area on L lower leg started to bleed   No injury that she know of   Bruises easily  81 mg asa daily   Sees dermatology yearly  Last visit was 2 mo ago  Recommended cetaphil lotion for dryness   Patient Active Problem List   Diagnosis Date Noted   Senile purpura (HCC) 12/26/2022   Elevated alkaline phosphatase level 03/11/2021   Palpitations 11/10/2020   Migraine    Hypertension, essential    H/O: CVA (cerebrovascular accident) 11/08/2020   Headache 09/26/2019   Colon cancer screening 02/05/2018   Degenerative joint disease (DJD) of lumbar spine 05/02/2017   Internal hemorrhoids 05/02/2017   Generalized osteoarthritis of hand 12/11/2016   GAD (generalized anxiety disorder) 12/11/2016   Estrogen deficiency 05/11/2016   Encounter for screening mammogram for breast cancer 05/11/2016   OSA (obstructive sleep apnea) 03/06/2016   Fatigue 01/25/2016   Left knee pain 10/18/2015   Snoring 10/18/2015   Routine general medical examination at a health care facility 02/28/2015   Encounter for Medicare annual wellness exam 02/06/2013   Hyperlipidemia 08/27/2012   Irregular heart beat 08/05/2012   Gynecological examination 12/20/2010   BARRETTS ESOPHAGUS 10/01/2010   Osteopenia 12/17/2009   BACK PAIN, LUMBAR 07/02/2009   IRRITABLE BOWEL SYNDROME 04/02/2009   POSTMENOPAUSAL STATUS 12/10/2008   Hypothyroidism 12/05/2007   Meniere's disease 12/05/2007   GERD 12/05/2007   Past Medical History:  Diagnosis Date   Anxiety    Cataract 1997   Colon cancer screening  02/05/2018   DDD (degenerative disc disease)    chronic back pain   ETD (eustachian tube dysfunction)    GERD (gastroesophageal reflux disease)    Hypertension 2022   IBS (irritable bowel syndrome)    Meniere's disease    Migraine    still gets visual aura from time to time   Mild cognitive impairment    Osteoporosis    Sleep apnea    CPAP   Stroke (HCC) 2022   Syncopal episodes    after work-up - possible seizures?   Thyroid disease    hypothyroid   TIA (transient ischemic attack) 11/09/2020   Past Surgical History:  Procedure Laterality Date   APPENDECTOMY     CATARACT EXTRACTION W/PHACO Right 05/21/2019   Procedure: CATARACT EXTRACTION PHACO AND INTRAOCULAR LENS PLACEMENT (IOC) RIGHT panoptix lens  00:35.0  21.7%  7.61;  Surgeon: Lockie Mola, MD;  Location: [facility];  Service: Ophthalmology;  Laterality: Right;  sleep apnea requests early   CATARACT EXTRACTION W/PHACO Left 06/11/2019   Procedure: CATARACT EXTRACTION PHACO AND INTRAOCULAR LENS PLACEMENT (IOC) LEFT PANOPTIX TORIC LENS  00:50.0  20.3%  10.21;  Surgeon: Lockie Mola, MD;  Location: [facility];  Service: Ophthalmology;  Laterality: Left;  sleep apnea requests early   COLONOSCOPY     last 2012 - [facility]   ESOPHAGOGASTRODUODENOSCOPY     Multiple, last 01/07/2017 with Savary dilation to  18 mm   HEMORRHOID BANDING  2018   [facility]   LUMBAR DISC SURGERY  1984   L5    Social History   Tobacco Use   Smoking status: Never    Passive exposure: Never   Smokeless tobacco: Never  Vaping Use   Vaping Use: Never used  Substance Use Topics   Alcohol use: Yes    Comment: Average 1 glass of wine/week   Drug use: Yes    Types: Marijuana    Comment: CBD drops   Family History  Problem Relation Age of Onset   Diabetes Paternal Aunt    Breast cancer Paternal Aunt    Hypertension Maternal Grandmother    Hypertension Maternal Grandfather    Allergies  Allergen Reactions   Aspirin  Other (See Comments)    GI intolerance    Nsaids     GI discomfort and gastritis   Tolmetin Other (See Comments)    GI discomfort and gastritis   Current Outpatient Medications on File Prior to Visit  Medication Sig Dispense Refill   ALPRAZolam (XANAX) 0.25 MG tablet TAKE 1 TABLET BY MOUTH TWICE A DAY AS NEEDED FOR ANXIETY (CAUSES SEDATION) 30 tablet 0   AMBULATORY NON FORMULARY MEDICATION Take 35 mg by mouth in the morning and at bedtime. CBD drops 30 mg oral at breakfast and 25 mg oral at bedtime     amLODipine (NORVASC) 5 MG tablet Take 1 tablet (5 mg total) by mouth daily. Take 1/2 daily 45 tablet 3   aspirin EC 81 MG EC tablet Take 1 tablet (81 mg total) by mouth daily. Swallow whole. 30 tablet 11   B Complex-C (SUPER B COMPLEX PO) Take 1 tablet by mouth daily.     butalbital-acetaminophen-caffeine (FIORICET) 50-325-40 MG tablet Take 1 tablet by mouth every 6 (six) hours as needed. 60 tablet 0   calcium elemental as carbonate (BARIATRIC TUMS ULTRA) 400 MG chewable tablet Chew 1,000 mg by mouth daily.     cholecalciferol (VITAMIN D) 25 MCG (1000 UNIT) tablet Take 1,000 Units by mouth daily.     diclofenac sodium (VOLTAREN) 1 % GEL APPLY 2 GRAMS TOPICALLY FOUR TIMES A DAY TO AFFECTED AREAS 300 g 3   estradiol (ESTRACE) 0.1 MG/GM vaginal cream INSERT SMALL AMOUNT (1 CM) VAGINALLY EVERY OTHER DAY 42.5 g 1   levothyroxine (SYNTHROID) 75 MCG tablet Take 1 tablet (75 mcg total) by mouth daily. 90 tablet 3   linaclotide (LINZESS) 145 MCG CAPS capsule Take 1 capsule (145 mcg total) by mouth as needed. 90 capsule 3   metoprolol tartrate (LOPRESSOR) 25 MG tablet Take 12.5 mg by mouth daily.     mometasone (NASONEX) 50 MCG/ACT nasal spray Place 2 sprays into the nose daily as needed.     omeprazole (PRILOSEC) 40 MG capsule Take 1 capsule (40 mg total) by mouth daily. 90 capsule 3   spironolactone (ALDACTONE) 25 MG tablet Take 0.5 tablets (12.5 mg total) by mouth as needed. (Patient taking  differently: Take 12.5 mg by mouth daily.) 45 tablet 3   sucralfate (CARAFATE) 1 g tablet Take 1 g by mouth 4 (four) times daily as needed.     No current facility-administered medications on file prior to visit.     Review of Systems  Constitutional:  Negative for activity change, appetite change, fatigue, fever and unexpected weight change.  HENT:  Negative for congestion, ear pain, rhinorrhea, sinus pressure and sore throat.   Eyes:  Negative for pain, redness and  visual disturbance.  Respiratory:  Negative for cough, shortness of breath and wheezing.   Cardiovascular:  Negative for chest pain and palpitations.  Gastrointestinal:  Negative for abdominal pain, blood in stool, constipation and diarrhea.  Endocrine: Negative for polydipsia and polyuria.  Genitourinary:  Negative for dysuria, frequency and urgency.  Musculoskeletal:  Negative for arthralgias, back pain and myalgias.  Skin:  Positive for wound. Negative for pallor and rash.  Allergic/Immunologic: Negative for environmental allergies.  Neurological:  Negative for dizziness, syncope and headaches.  Hematological:  Negative for adenopathy. Does not bruise/bleed easily.  Psychiatric/Behavioral:  Negative for decreased concentration and dysphoric mood. The patient is not nervous/anxious.        Objective:   Physical Exam Constitutional:      General: She is not in acute distress.    Appearance: Normal appearance. She is normal weight. She is not ill-appearing.  Eyes:     Conjunctiva/sclera: Conjunctivae normal.     Pupils: Pupils are equal, round, and reactive to light.  Cardiovascular:     Rate and Rhythm: Normal rate and regular rhythm.  Pulmonary:     Effort: Pulmonary effort is normal. No respiratory distress.  Musculoskeletal:     Cervical back: Neck supple.  Lymphadenopathy:     Cervical: No cervical adenopathy.  Skin:    Findings: No rash.     Comments: Skin is thin  Dry on legs   Some purpura on arms  (largest 2 by 1 cm L wrist area) Scattered on legs  L lower leg 1 cm area of purpura with small skin tear  No signs of infection No active bleeding and no drainage or tenderness  Solar lentigines diffusely Solar aging  Some sks Some scattered angiomas   Neurological:     Mental Status: She is alert.     Sensory: No sensory deficit.  Psychiatric:        Mood and Affect: Mood normal.           Assessment & Plan:   Problem List Items Addressed This Visit       Cardiovascular and Mediastinum   Senile purpura (HCC) - Primary    Due to think skin and exacerbated with low dose asa daily  Discussed use of care with skin/not to traumatize  Moisturize and protect from sun and friction when able  Continue regular dermatology care           Musculoskeletal and Integument   Noninfected skin tear of lower extremity    Overlying area of purpura  No signs of infection Recommended soap and water cleanse gently  Abx oint otc prn   Update if not starting to improve in a week or if worsening

## 2022-12-28 ENCOUNTER — Other Ambulatory Visit: Payer: Self-pay | Admitting: Family Medicine

## 2022-12-28 DIAGNOSIS — Z1231 Encounter for screening mammogram for malignant neoplasm of breast: Secondary | ICD-10-CM

## 2023-01-24 ENCOUNTER — Ambulatory Visit: Payer: Medicare HMO | Admitting: Cardiology

## 2023-01-29 ENCOUNTER — Encounter: Payer: Self-pay | Admitting: Cardiology

## 2023-01-29 ENCOUNTER — Ambulatory Visit: Payer: Medicare HMO | Attending: Cardiology | Admitting: Cardiology

## 2023-01-29 VITALS — BP 122/66 | HR 59 | Ht 65.0 in | Wt 129.6 lb

## 2023-01-29 DIAGNOSIS — I10 Essential (primary) hypertension: Secondary | ICD-10-CM | POA: Diagnosis not present

## 2023-01-29 DIAGNOSIS — R55 Syncope and collapse: Secondary | ICD-10-CM

## 2023-01-29 NOTE — Patient Instructions (Signed)
Medication Instructions:   Your physician recommends that you continue on your current medications as directed. Please refer to the Current Medication list given to you today.  *If you need a refill on your cardiac medications before your next appointment, please call your pharmacy*   Lab Work:  None Ordered  If you have labs (blood work) drawn today and your tests are completely normal, you will receive your results only by: MyChart Message (if you have MyChart) OR A paper copy in the mail If you have any lab test that is abnormal or we need to change your treatment, we will call you to review the results.   Testing/Procedures:  None Ordered   Follow-Up: At [facility], you and your health needs are our priority.  As part of our continuing mission to provide you with exceptional heart care, we have created designated Provider Care Teams.  These Care Teams include your primary Cardiologist (physician) and Advanced Practice Providers (APPs -  Physician Assistants and Nurse Practitioners) who all work together to provide you with the care you need, when you need it.  We recommend signing up for the patient portal called "MyChart".  Sign up information is provided on this After Visit Summary.  MyChart is used to connect with patients for Virtual Visits (Telemedicine).  Patients are able to view lab/test results, encounter notes, upcoming appointments, etc.  Non-urgent messages can be sent to your provider as well.   To learn more about what you can do with MyChart, go to [email or web address].    Your next appointment:  AS NEEDED 

## 2023-01-29 NOTE — Progress Notes (Signed)
Cardiology Office Note:    Date:  01/29/2023   ID:  Sarah Harper, DOB October 16, 1940, MRN [id number]  PCP:  Judy Pimple, MD  [facility] Cardiologist:  Debbe Odea, MD  [facility] Electrophysiologist:  None   Referring MD: Judy Pimple, MD   Chief Complaint  Patient presents with   Follow-up    Patient denies new or acute cardiac problems/concerns today.      History of Present Illness:    Sarah Harper is a 82 y.o. female with a hx of hypertension, degenerative joint disease, GERD, CVA, venous insufficiency who presents for follow-up.  Previously seen due to presyncope.  Etiology for syncope appears to be vasovagal.  Cardiac monitor placed to evaluate any significant arrhythmias.  Symptoms of presyncope occurred with bending head over extended periods.  Has not had any further episodes, blood pressure is adequately controlled, patient states feeling well, no concerns at this time.  Prior notes echocardiogram on 01/2015 due to syncope which showed normal systolic function, EF 60 to 65%, normal diastolic function. Medication adjustments were previously made.  She wanted to take less medications, as such irbesartan was started, Lopressor and Aldactone was stopped.  She complains of having elevated blood pressure with medication changes.  Her previous medications including Aldactone and Lopressor were restarted.  Norvasc was continued.  Past Medical History:  Diagnosis Date   Anxiety    Cataract 1997   Colon cancer screening 02/05/2018   DDD (degenerative disc disease)    chronic back pain   ETD (eustachian tube dysfunction)    GERD (gastroesophageal reflux disease)    Hypertension 2022   IBS (irritable bowel syndrome)    Meniere's disease    Migraine    still gets visual aura from time to time   Mild cognitive impairment    Osteoporosis    Sleep apnea    CPAP   Stroke (HCC) 2022   Syncopal episodes    after work-up - possible seizures?   Thyroid  disease    hypothyroid   TIA (transient ischemic attack) 11/09/2020    Past Surgical History:  Procedure Laterality Date   APPENDECTOMY     CATARACT EXTRACTION W/PHACO Right 05/21/2019   Procedure: CATARACT EXTRACTION PHACO AND INTRAOCULAR LENS PLACEMENT (IOC) RIGHT panoptix lens  00:35.0  21.7%  7.61;  Surgeon: Lockie Mola, MD;  Location: [facility];  Service: Ophthalmology;  Laterality: Right;  sleep apnea requests early   CATARACT EXTRACTION W/PHACO Left 06/11/2019   Procedure: CATARACT EXTRACTION PHACO AND INTRAOCULAR LENS PLACEMENT (IOC) LEFT PANOPTIX TORIC LENS  00:50.0  20.3%  10.21;  Surgeon: Lockie Mola, MD;  Location: [facility];  Service: Ophthalmology;  Laterality: Left;  sleep apnea requests early   COLONOSCOPY     last 2012 - [facility]   ESOPHAGOGASTRODUODENOSCOPY     Multiple, last 01/07/2017 with Savary dilation to 18 mm   HEMORRHOID BANDING  2018   [facility]   LUMBAR DISC SURGERY  1984   L5     Current Medications: Current Meds  Medication Sig   ALPRAZolam (XANAX) 0.25 MG tablet TAKE 1 TABLET BY MOUTH TWICE A DAY AS NEEDED FOR ANXIETY (CAUSES SEDATION)   AMBULATORY NON FORMULARY MEDICATION Take 35 mg by mouth in the morning and at bedtime. CBD drops 30 mg oral at breakfast and 25 mg oral at bedtime   amLODipine (NORVASC) 5 MG tablet Take 1 tablet (5 mg total) by mouth daily. Take 1/2 daily   aspirin  EC 81 MG EC tablet Take 1 tablet (81 mg total) by mouth daily. Swallow whole.   B Complex-C (SUPER B COMPLEX PO) Take 1 tablet by mouth daily.   butalbital-acetaminophen-caffeine (FIORICET) 50-325-40 MG tablet Take 1 tablet by mouth every 6 (six) hours as needed.   calcium elemental as carbonate (BARIATRIC TUMS ULTRA) 400 MG chewable tablet Chew 1,000 mg by mouth daily.   cholecalciferol (VITAMIN D) 25 MCG (1000 UNIT) tablet Take 1,000 Units by mouth daily.   diclofenac sodium (VOLTAREN) 1 % GEL APPLY 2 GRAMS TOPICALLY FOUR TIMES A DAY TO  AFFECTED AREAS   estradiol (ESTRACE) 0.1 MG/GM vaginal cream INSERT SMALL AMOUNT (1 CM) VAGINALLY EVERY OTHER DAY   levothyroxine (SYNTHROID) 75 MCG tablet Take 1 tablet (75 mcg total) by mouth daily.   linaclotide (LINZESS) 145 MCG CAPS capsule Take 1 capsule (145 mcg total) by mouth as needed.   metoprolol tartrate (LOPRESSOR) 25 MG tablet Take 25 mg by mouth daily.   mometasone (NASONEX) 50 MCG/ACT nasal spray Place 2 sprays into the nose daily as needed.   omeprazole (PRILOSEC) 40 MG capsule Take 1 capsule (40 mg total) by mouth daily.   spironolactone (ALDACTONE) 25 MG tablet Take 12.5 mg by mouth in the morning.   sucralfate (CARAFATE) 1 g tablet Take 1 g by mouth 4 (four) times daily as needed.   [DISCONTINUED] spironolactone (ALDACTONE) 25 MG tablet Take 0.5 tablets (12.5 mg total) by mouth as needed. (Patient taking differently: Take 12.5 mg by mouth in the morning.)     Allergies:   Aspirin, Nsaids, and Tolmetin   Social History   Socioeconomic History   Marital status: Married    Spouse name: Erland   Number of children: 0   Years of education: Not on file   Highest education level: Not on file  Occupational History   Occupation: Retired  Tobacco Use   Smoking status: Never    Passive exposure: Never   Smokeless tobacco: Never  Vaping Use   Vaping Use: Never used  Substance and Sexual Activity   Alcohol use: Yes    Comment: Average 1 glass of wine/week   Drug use: Yes    Types: Marijuana    Comment: CBD drops   Sexual activity: Yes    Birth control/protection: Post-menopausal  Other Topics Concern   Not on file  Social History Narrative   Married, husband is a type I diabetic.  No children.   Elderly mother in her 90s lives in an assisted living facility.   Very occasional white wine with dinner, never smoker no drug use.  Limited caffeine.   Social Determinants of Health   Financial Resource Strain: Low Risk  (12/25/2022)   Overall Financial Resource Strain  (CARDIA)    Difficulty of Paying Living Expenses: Not hard at all  Food Insecurity: No Food Insecurity (12/25/2022)   Hunger Vital Sign    Worried About Running Out of Food in the Last Year: Never true    Ran Out of Food in the Last Year: Never true  Transportation Needs: No Transportation Needs (12/25/2022)   PRAPARE - Administrator, Civil Service (Medical): No    Lack of Transportation (Non-Medical): No  Physical Activity: Insufficiently Active (12/25/2022)   Exercise Vital Sign    Days of Exercise per Week: 7 days    Minutes of Exercise per Session: 20 min  Stress: No Stress Concern Present (12/25/2022)   [employer] of Occupational Health - Occupational  Stress Questionnaire    Feeling of Stress : Not at all  Social Connections: Moderately Isolated (12/25/2022)   Social Connection and Isolation Panel [NHANES]    Frequency of Communication with Friends and Family: More than three times a week    Frequency of Social Gatherings with Friends and Family: More than three times a week    Attends Religious Services: Never    Database administrator or Organizations: No    Attends Engineer, structural: Never    Marital Status: Married     Family History: The patient's family history includes Breast cancer in her paternal aunt; Diabetes in her paternal aunt; Hypertension in her maternal grandfather and maternal grandmother.  ROS:   Please see the history of present illness.     All other systems reviewed and are negative.  EKGs/Labs/Other Studies Reviewed:    The following studies were reviewed today:   EKG:  EKG not ordered today.   Recent Labs: 03/08/2022: ALT 18; BUN 11; Creatinine, Ser 0.69; Hemoglobin 12.7; Platelets 248.0; Potassium 3.9; Sodium 137 06/15/2022: TSH 0.63  Recent Lipid Panel    Component Value Date/Time   CHOL 181 03/08/2022 0819   CHOL 119 12/08/2020 1542   TRIG 63.0 03/08/2022 0819   HDL 67.50 03/08/2022 0819   HDL 74 12/08/2020 1542    CHOLHDL 3 03/08/2022 0819   VLDL 12.6 03/08/2022 0819   LDLCALC 101 (H) 03/08/2022 0819   LDLCALC 33 12/08/2020 1542   LDLDIRECT 115.5 02/12/2013 0755    Physical Exam:    VS:  BP 122/66 (BP Location: Left Arm, Patient Position: Sitting, Cuff Size: Normal)   Pulse (!) 59   Ht 5\' 5"  (1.651 m)   Wt 129 lb 9.6 oz (58.8 kg)   SpO2 98%   BMI 21.57 kg/m     Wt Readings from Last 3 Encounters:  01/29/23 129 lb 9.6 oz (58.8 kg)  12/26/22 129 lb 4 oz (58.6 kg)  12/25/22 130 lb (59 kg)     GEN:  Well nourished, well developed in no acute distress HEENT: Normal NECK: No JVD; No carotid bruits CARDIAC: RRR, no murmur RESPIRATORY:  Clear to auscultation without rales, wheezing or rhonchi  ABDOMEN: Soft, non-tender, non-distended MUSCULOSKELETAL: no edema; bilateral varicose veins noted SKIN: Warm and dry NEUROLOGIC:  Alert and oriented x 3 PSYCHIATRIC:  Normal affect   ASSESSMENT:    1. Primary hypertension   2. Pre-syncope    PLAN:    In order of problems listed above:  Hypertension,  BP is controlled.  Continue Lopressor, Aldactone, Norvasc.   Presyncope, cardiac monitor with no significant sustained arrhythmias.  Symptoms appears vasovagal, sometimes associated with bending. Safety precautions/sitting when prodromal symptoms arise advised.  Follow-up as needed.   Medication Adjustments/Labs and Tests Ordered: Current medicines are reviewed at length with the patient today.  Concerns regarding medicines are outlined above.  No orders of the defined types were placed in this encounter.     No orders of the defined types were placed in this encounter.     Patient Instructions  Medication Instructions:   Your physician recommends that you continue on your current medications as directed. Please refer to the Current Medication list given to you today.  *If you need a refill on your cardiac medications before your next appointment, please call your pharmacy*   Lab  Work:  None Ordered  If you have labs (blood work) drawn today and your tests are completely normal, you will  receive your results only by: MyChart Message (if you have MyChart) OR A paper copy in the mail If you have any lab test that is abnormal or we need to change your treatment, we will call you to review the results.   Testing/Procedures:  None Ordered    Follow-Up: At [facility], you and your health needs are our priority.  As part of our continuing mission to provide you with exceptional heart care, we have created designated Provider Care Teams.  These Care Teams include your primary Cardiologist (physician) and Advanced Practice Providers (APPs -  Physician Assistants and Nurse Practitioners) who all work together to provide you with the care you need, when you need it.  We recommend signing up for the patient portal called "MyChart".  Sign up information is provided on this After Visit Summary.  MyChart is used to connect with patients for Virtual Visits (Telemedicine).  Patients are able to view lab/test results, encounter notes, upcoming appointments, etc.  Non-urgent messages can be sent to your provider as well.   To learn more about what you can do with MyChart, go to [email or web address].    Your next appointment:    AS NEEDED \ Signed, Debbe Odea, MD  01/29/2023 11:09 AM    [facility]

## 2023-01-30 ENCOUNTER — Ambulatory Visit (INDEPENDENT_AMBULATORY_CARE_PROVIDER_SITE_OTHER): Payer: Medicare HMO

## 2023-01-30 ENCOUNTER — Ambulatory Visit
Admission: EM | Admit: 2023-01-30 | Discharge: 2023-01-30 | Disposition: A | Discharge status: Home / Self Care | Payer: Medicare HMO | Attending: Emergency Medicine | Admitting: Emergency Medicine

## 2023-01-30 DIAGNOSIS — M25569 Pain in unspecified knee: Secondary | ICD-10-CM | POA: Insufficient documentation

## 2023-01-30 DIAGNOSIS — M25561 Pain in right knee: Secondary | ICD-10-CM | POA: Diagnosis not present

## 2023-01-30 DIAGNOSIS — S0083XA Contusion of other part of head, initial encounter: Secondary | ICD-10-CM | POA: Diagnosis not present

## 2023-01-30 DIAGNOSIS — M79661 Pain in right lower leg: Secondary | ICD-10-CM

## 2023-01-30 DIAGNOSIS — S81811A Laceration without foreign body, right lower leg, initial encounter: Secondary | ICD-10-CM | POA: Diagnosis not present

## 2023-01-30 DIAGNOSIS — Z23 Encounter for immunization: Secondary | ICD-10-CM | POA: Diagnosis not present

## 2023-01-30 DIAGNOSIS — W19XXXA Unspecified fall, initial encounter: Secondary | ICD-10-CM | POA: Diagnosis not present

## 2023-01-30 MED ORDER — TETANUS-DIPHTH-ACELL PERTUSSIS 5-2.5-18.5 LF-MCG/0.5 IM SUSY
0.5000 mL | PREFILLED_SYRINGE | Freq: Once | INTRAMUSCULAR | Status: AC
  Administered 2023-01-30: 0.5 mL via INTRAMUSCULAR

## 2023-01-30 NOTE — ED Provider Notes (Addendum)
Sarah Harper    CSN: [id number] Arrival date & time: 01/30/23  1047      History   Chief Complaint Chief Complaint  Patient presents with   Leg Injury    HPI Sarah Harper is a 82 y.o. female.  Accompanied by her husband, patient presents with right lower leg pain and skin tears, right thumb bruise, right facial bruise after she accidentally fell in her attic today.  She hit her leg and face on the wood joists.  She denies loss of consciousness.  Bleeding controlled with direct pressure.  No dizziness, numbness, weakness, chest pain, shortness of breath, or other symptoms.  She has minimal discomfort of her face and thumb.  Last tetanus 2013.  The history is provided by the patient and medical records.    Past Medical History:  Diagnosis Date   Anxiety    Cataract 1997   Colon cancer screening 02/05/2018   DDD (degenerative disc disease)    chronic back pain   ETD (eustachian tube dysfunction)    GERD (gastroesophageal reflux disease)    Hypertension 2022   IBS (irritable bowel syndrome)    Meniere's disease    Migraine    still gets visual aura from time to time   Mild cognitive impairment    Osteoporosis    Sleep apnea    CPAP   Stroke (HCC) 2022   Syncopal episodes    after work-up - possible seizures?   Thyroid disease    hypothyroid   TIA (transient ischemic attack) 11/09/2020    Patient Active Problem List   Diagnosis Date Noted   Pain in joint, lower leg 01/30/2023   Senile purpura (HCC) 12/26/2022   Noninfected skin tear of lower extremity 12/26/2022   Elevated alkaline phosphatase level 03/11/2021   Palpitations 11/10/2020   Migraine    Hypertension, essential    H/O: CVA (cerebrovascular accident) 11/08/2020   Headache 09/26/2019   Colon cancer screening 02/05/2018   Degenerative joint disease (DJD) of lumbar spine 05/02/2017   Internal hemorrhoids 05/02/2017   Generalized osteoarthritis of hand 12/11/2016   GAD (generalized anxiety  disorder) 12/11/2016   Estrogen deficiency 05/11/2016   Encounter for screening mammogram for breast cancer 05/11/2016   OSA (obstructive sleep apnea) 03/06/2016   Fatigue 01/25/2016   Left knee pain 10/18/2015   Snoring 10/18/2015   Routine general medical examination at a health care facility 02/28/2015   Encounter for Medicare annual wellness exam 02/06/2013   Hyperlipidemia 08/27/2012   Irregular heart beat 08/05/2012   Gynecological examination 12/20/2010   BARRETTS ESOPHAGUS 10/01/2010   Osteopenia 12/17/2009   BACK PAIN, LUMBAR 07/02/2009   IRRITABLE BOWEL SYNDROME 04/02/2009   POSTMENOPAUSAL STATUS 12/10/2008   Hypothyroidism 12/05/2007   Meniere's disease 12/05/2007   GERD 12/05/2007    Past Surgical History:  Procedure Laterality Date   APPENDECTOMY     CATARACT EXTRACTION W/PHACO Right 05/21/2019   Procedure: CATARACT EXTRACTION PHACO AND INTRAOCULAR LENS PLACEMENT (IOC) RIGHT panoptix lens  00:35.0  21.7%  7.61;  Surgeon: Lockie Mola, MD;  Location: [facility];  Service: Ophthalmology;  Laterality: Right;  sleep apnea requests early   CATARACT EXTRACTION W/PHACO Left 06/11/2019   Procedure: CATARACT EXTRACTION PHACO AND INTRAOCULAR LENS PLACEMENT (IOC) LEFT PANOPTIX TORIC LENS  00:50.0  20.3%  10.21;  Surgeon: Lockie Mola, MD;  Location: [facility];  Service: Ophthalmology;  Laterality: Left;  sleep apnea requests early   COLONOSCOPY     last 2012 -  [facility]   ESOPHAGOGASTRODUODENOSCOPY     Multiple, last 01/07/2017 with Savary dilation to 18 mm   HEMORRHOID BANDING  2018   [facility]   LUMBAR DISC SURGERY  1984   L5     OB History   No obstetric history on file.      Home Medications    Prior to Admission medications   Medication Sig Start Date End Date Taking? Authorizing Provider  ALPRAZolam (XANAX) 0.25 MG tablet TAKE 1 TABLET BY MOUTH TWICE A DAY AS NEEDED FOR ANXIETY (CAUSES SEDATION) 05/18/21   Tower, Audrie Gallus, MD   AMBULATORY NON FORMULARY MEDICATION Take 35 mg by mouth in the morning and at bedtime. CBD drops 30 mg oral at breakfast and 25 mg oral at bedtime    [provider]  amLODipine (NORVASC) 5 MG tablet Take 1 tablet (5 mg total) by mouth daily. Take 1/2 daily 03/13/22   Tower, Audrie Gallus, MD  aspirin EC 81 MG EC tablet Take 1 tablet (81 mg total) by mouth daily. Swallow whole. 11/10/20   Lewie Chamber, MD  B Complex-C (SUPER B COMPLEX PO) Take 1 tablet by mouth daily.    [provider]  butalbital-acetaminophen-caffeine (FIORICET) 50-325-40 MG tablet Take 1 tablet by mouth every 6 (six) hours as needed. 03/09/20   Tower, Audrie Gallus, MD  calcium elemental as carbonate (BARIATRIC TUMS ULTRA) 400 MG chewable tablet Chew 1,000 mg by mouth daily.    [provider]  cholecalciferol (VITAMIN D) 25 MCG (1000 UNIT) tablet Take 1,000 Units by mouth daily.    [provider]  diclofenac sodium (VOLTAREN) 1 % GEL APPLY 2 GRAMS TOPICALLY FOUR TIMES A DAY TO AFFECTED AREAS 05/24/18   Tower, Audrie Gallus, MD  estradiol (ESTRACE) 0.1 MG/GM vaginal cream INSERT SMALL AMOUNT (1 CM) VAGINALLY EVERY OTHER DAY 07/04/22   Tower, Audrie Gallus, MD  levothyroxine (SYNTHROID) 75 MCG tablet Take 1 tablet (75 mcg total) by mouth daily. 03/13/22   Tower, Audrie Gallus, MD  linaclotide [facility]) 145 MCG CAPS capsule Take 1 capsule (145 mcg total) by mouth as needed. 03/13/22   Tower, Audrie Gallus, MD  metoprolol tartrate (LOPRESSOR) 25 MG tablet Take 25 mg by mouth daily.    [provider]  mometasone (NASONEX) 50 MCG/ACT nasal spray Place 2 sprays into the nose daily as needed.    [provider]  omeprazole (PRILOSEC) 40 MG capsule Take 1 capsule (40 mg total) by mouth daily. 03/13/22   Tower, Audrie Gallus, MD  spironolactone (ALDACTONE) 25 MG tablet Take 12.5 mg by mouth in the morning.    [provider]  sucralfate (CARAFATE) 1 g tablet Take 1 g by mouth 4 (four) times daily as needed.     [provider]    Family History Family History  Problem Relation Age of Onset   Diabetes Paternal Aunt    Breast cancer Paternal Aunt    Hypertension Maternal Grandmother    Hypertension Maternal Grandfather     Social History Social History   Tobacco Use   Smoking status: Never    Passive exposure: Never   Smokeless tobacco: Never  Vaping Use   Vaping Use: Never used  Substance Use Topics   Alcohol use: Yes    Comment: Average 1 glass of wine/week   Drug use: Yes    Types: Marijuana    Comment: CBD drops     Allergies   Aspirin, Nsaids, and Tolmetin   Review of Systems Review  of Systems  Constitutional:  Negative for chills and fever.  Respiratory:  Negative for cough and shortness of breath.   Cardiovascular:  Negative for chest pain and palpitations.  Musculoskeletal:  Negative for gait problem and joint swelling.       Pain and bruising of right lower leg.    Skin:  Positive for color change and wound.       Skin tears on right lower leg.  Bruising of right cheek.  Bruise on right thumb.  Neurological:  Negative for dizziness, syncope, weakness and numbness.     Physical Exam Triage Vital Signs ED Triage Vitals  Enc Vitals Group     BP      Pulse      Resp      Temp      Temp src      SpO2      Weight      Height      Head Circumference      Peak Flow      Pain Score      Pain Loc      Pain Edu?      Excl. in GC?    No data found.  Updated Vital Signs BP (!) 144/86   Pulse 70   Temp 97.8 F (36.6 C)   Resp 18   SpO2 95%   Visual Acuity Right Eye Distance:   Left Eye Distance:   Bilateral Distance:    Right Eye Near:   Left Eye Near:    Bilateral Near:     Physical Exam Vitals and nursing note reviewed.  Constitutional:      General: She is not in acute distress.    Appearance: She is well-developed.  HENT:     Right Ear: Tympanic membrane normal.     Left Ear: Tympanic membrane normal.     Nose: Nose normal.      Mouth/Throat:     Mouth: Mucous membranes are moist.     Pharynx: Oropharynx is clear.  Eyes:     Extraocular Movements: Extraocular movements intact.     Pupils: Pupils are equal, round, and reactive to light.  Cardiovascular:     Rate and Rhythm: Normal rate and regular rhythm.     Heart sounds: Normal heart sounds.  Pulmonary:     Effort: Pulmonary effort is normal. No respiratory distress.     Breath sounds: Normal breath sounds.  Abdominal:     General: Bowel sounds are normal.     Palpations: Abdomen is soft.     Tenderness: There is no abdominal tenderness. There is no guarding or rebound.  Musculoskeletal:        General: Tenderness present. No swelling or deformity. Normal range of motion.     Cervical back: Neck supple.  Skin:    General: Skin is warm and dry.     Capillary Refill: Capillary refill takes less than 2 seconds.     Findings: Bruising and lesion present.     Comments: Ecchymosis and edema of right cheek below right eye.  See picture.  Blood blister of right thumb.  See picture. Three skin tears and ecchymosis of right lower leg.  See picture.  Skin tears cleaned and closed with Dermabond.    Neurological:     General: No focal deficit present.     Mental Status: She is alert and oriented to person, place, and time.     Sensory: No sensory deficit.  Motor: No weakness.     Gait: Gait normal.  Psychiatric:        Mood and Affect: Mood normal.        Behavior: Behavior normal.           UC Treatments / Results  Labs (all labs ordered are listed, but only abnormal results are displayed) Labs Reviewed - No data to display  EKG   Radiology DG Tibia/Fibula Right  Result Date: 01/30/2023 CLINICAL DATA:  Right leg pain. Stepped through the floor of attic. Multiple skin tears. EXAM: RIGHT TIBIA AND FIBULA - 2 VIEW COMPARISON:  None Available. FINDINGS: The knee and ankle joints are maintained. Mild degenerative changes and chondrocalcinosis  noted. No acute fracture of the tibia or fibula is identified. No radiopaque foreign bodies. IMPRESSION: 1. No acute bony findings. 2. No radiopaque foreign body. Electronically Signed   By: Rudie Meyer M.D.   On: 01/30/2023 12:17    Procedures Laceration Repair  Date/Time: 01/30/2023 1:11 PM  Performed by: Mickie Bail, NP Authorized by: Mickie Bail, NP   Consent:    Consent obtained:  Verbal   Consent given by:  Patient   Risks discussed:  Infection, pain, poor cosmetic result and poor wound healing Universal protocol:    Procedure explained and questions answered to patient or proxy's satisfaction: yes   Laceration details:    Location:  Leg   Leg location:  R lower leg Pre-procedure details:    Preparation:  Patient was prepped and draped in usual sterile fashion Exploration:    Hemostasis achieved with:  Direct pressure   Wound exploration: wound explored through full range of motion and entire depth of wound visualized   Treatment:    Area cleansed with:  Shur-Clens   Amount of cleaning:  Standard   Irrigation solution:  Sterile water   Irrigation method:  Syringe Skin repair:    Repair method:  Tissue adhesive Approximation:    Approximation:  Close Repair type:    Repair type:  Simple Post-procedure details:    Dressing:  Open (no dressing)   Procedure completion:  Tolerated well, no immediate complications  (including critical care time)  Medications Ordered in UC Medications  Tdap (BOOSTRIX) injection 0.5 mL (0.5 mLs Intramuscular Given 01/30/23 1131)    Initial Impression / Assessment and Plan / UC Course  I have reviewed the triage vital signs and the nursing notes.  Pertinent labs & imaging results that were available during my care of the patient were reviewed by me and considered in my medical decision making (see chart for details).    Pain of right lower leg, skin tears of right lower leg, facial contusion, fall.  Discussed limitations of  evaluation in an urgent care setting.  Patient declines transfer to the ED at this time.  Tetanus updated.  Wounds closed with Dermabond.  Xray negative.  ED precautions discussed.  Education provided on facial contusion, laceration care, fall prevention.  Instructed patient to follow-up with her PCP.  She agrees to plan of care.  Final Clinical Impressions(s) / UC Diagnoses   Final diagnoses:  Pain of right lower leg  Skin tear of right lower leg without complication, initial encounter  Contusion of face, initial encounter  Fall, initial encounter     Discharge Instructions      I will call you with the xray result.    Go to the emergency department if you have worsening symptoms.    See the  attached information.    Follow up with your primary care provider.  Your tetanus was updated today.            ED Prescriptions   None    PDMP not reviewed this encounter.   Mickie Bail, NP 01/30/23 1221    Mickie Bail, NP 01/30/23 1311

## 2023-01-30 NOTE — Discharge Instructions (Addendum)
I will call you with the xray result.    Go to the emergency department if you have worsening symptoms.    See the attached information.    Follow up with your primary care provider.  Your tetanus was updated today.

## 2023-01-30 NOTE — ED Triage Notes (Addendum)
Patient to Urgent Care with complaints of right sided leg pain following an injury this morning. Reports stepping through the floor/ beams of her attic. Believes she has some insulation stuck in the wounds in her leg. Three skin tears present to her shin.  Also w/ complaints of right thumb pain/ right sided eye pain. No LOC/ blood thinners.   TD in 2013.

## 2023-02-02 ENCOUNTER — Ambulatory Visit
Admission: EM | Admit: 2023-02-02 | Discharge: 2023-02-02 | Disposition: A | Discharge status: Home / Self Care | Payer: Medicare HMO | Attending: Emergency Medicine | Admitting: Emergency Medicine

## 2023-02-02 DIAGNOSIS — L03115 Cellulitis of right lower limb: Secondary | ICD-10-CM | POA: Diagnosis not present

## 2023-02-02 DIAGNOSIS — T148XXA Other injury of unspecified body region, initial encounter: Secondary | ICD-10-CM | POA: Diagnosis not present

## 2023-02-02 MED ORDER — CEPHALEXIN 500 MG PO CAPS: 500.0000 mg | ORAL_CAPSULE | Freq: Three times a day (TID) | ORAL | 0 refills | Status: DC

## 2023-02-02 NOTE — Discharge Instructions (Addendum)
Take the Keflex as directed.  Follow up with your primary care provider.

## 2023-02-02 NOTE — ED Triage Notes (Signed)
Patient to Urgent Care for wound recheck. Concerned about some of the redness she is seeing to her wounds on her right shins. Reports she has also noticed some more bleeding under the derma-bond.  No drainage/ pain.

## 2023-02-02 NOTE — ED Provider Notes (Signed)
[name]    CSN: [id number] Arrival date & time: 02/02/23  0815      History   Chief Complaint Chief Complaint  Patient presents with   Wound Check    HPI Sarah Harper is a 82 y.o. female.  Patient presents with concern for redness around her skin tear on her right lower leg.  She also has some leaking from the skin tears around the Dermabond.  She denies fever, chills, or other symptoms.  The history is provided by the patient and medical records.    Past Medical History:  Diagnosis Date   Anxiety    Cataract 1997   Colon cancer screening 02/05/2018   DDD (degenerative disc disease)    chronic back pain   ETD (eustachian tube dysfunction)    GERD (gastroesophageal reflux disease)    Hypertension 2022   IBS (irritable bowel syndrome)    Meniere's disease    Migraine    still gets visual aura from time to time   Mild cognitive impairment    Osteoporosis    Sleep apnea    CPAP   Stroke (HCC) 2022   Syncopal episodes    after work-up - possible seizures?   Thyroid disease    hypothyroid   TIA (transient ischemic attack) 11/09/2020    Patient Active Problem List   Diagnosis Date Noted   Pain in joint, lower leg 01/30/2023   Senile purpura (HCC) 12/26/2022   Noninfected skin tear of lower extremity 12/26/2022   Elevated alkaline phosphatase level 03/11/2021   Palpitations 11/10/2020   Migraine    Hypertension, essential    H/O: CVA (cerebrovascular accident) 11/08/2020   Headache 09/26/2019   Colon cancer screening 02/05/2018   Degenerative joint disease (DJD) of lumbar spine 05/02/2017   Internal hemorrhoids 05/02/2017   Generalized osteoarthritis of hand 12/11/2016   GAD (generalized anxiety disorder) 12/11/2016   Estrogen deficiency 05/11/2016   Encounter for screening mammogram for breast cancer 05/11/2016   OSA (obstructive sleep apnea) 03/06/2016   Fatigue 01/25/2016   Left knee pain 10/18/2015   Snoring 10/18/2015   Routine  general medical examination at a health care facility 02/28/2015   Encounter for Medicare annual wellness exam 02/06/2013   Hyperlipidemia 08/27/2012   Irregular heart beat 08/05/2012   Gynecological examination 12/20/2010   BARRETTS ESOPHAGUS 10/01/2010   Osteopenia 12/17/2009   BACK PAIN, LUMBAR 07/02/2009   IRRITABLE BOWEL SYNDROME 04/02/2009   POSTMENOPAUSAL STATUS 12/10/2008   Hypothyroidism 12/05/2007   Meniere's disease 12/05/2007   GERD 12/05/2007    Past Surgical History:  Procedure Laterality Date   APPENDECTOMY     CATARACT EXTRACTION W/PHACO Right 05/21/2019   Procedure: CATARACT EXTRACTION PHACO AND INTRAOCULAR LENS PLACEMENT (IOC) RIGHT panoptix lens  00:35.0  21.7%  7.61;  Surgeon: Lockie Mola, MD;  Location: [facility];  Service: Ophthalmology;  Laterality: Right;  sleep apnea requests early   CATARACT EXTRACTION W/PHACO Left 06/11/2019   Procedure: CATARACT EXTRACTION PHACO AND INTRAOCULAR LENS PLACEMENT (IOC) LEFT PANOPTIX TORIC LENS  00:50.0  20.3%  10.21;  Surgeon: Lockie Mola, MD;  Location: [facility];  Service: Ophthalmology;  Laterality: Left;  sleep apnea requests early   COLONOSCOPY     last 2012 - [facility]   ESOPHAGOGASTRODUODENOSCOPY     Multiple, last 01/07/2017 with Savary dilation to 18 mm   HEMORRHOID BANDING  2018   [facility]   LUMBAR DISC SURGERY  1984   L5     OB  History   No obstetric history on file.      Home Medications    Prior to Admission medications   Medication Sig Start Date End Date Taking? Authorizing Provider  cephALEXin (KEFLEX) 500 MG capsule Take 1 capsule (500 mg total) by mouth 3 (three) times daily. 02/02/23  Yes Mickie Bail, NP  ALPRAZolam (XANAX) 0.25 MG tablet TAKE 1 TABLET BY MOUTH TWICE A DAY AS NEEDED FOR ANXIETY (CAUSES SEDATION) 05/18/21   Tower, Audrie Gallus, MD  AMBULATORY NON FORMULARY MEDICATION Take 35 mg by mouth in the morning and at bedtime. CBD drops 30 mg oral at breakfast  and 25 mg oral at bedtime    [provider]  amLODipine (NORVASC) 5 MG tablet Take 1 tablet (5 mg total) by mouth daily. Take 1/2 daily 03/13/22   Tower, Audrie Gallus, MD  aspirin EC 81 MG EC tablet Take 1 tablet (81 mg total) by mouth daily. Swallow whole. 11/10/20   Lewie Chamber, MD  B Complex-C (SUPER B COMPLEX PO) Take 1 tablet by mouth daily.    [provider]  butalbital-acetaminophen-caffeine (FIORICET) 50-325-40 MG tablet Take 1 tablet by mouth every 6 (six) hours as needed. 03/09/20   Tower, Audrie Gallus, MD  calcium elemental as carbonate (BARIATRIC TUMS ULTRA) 400 MG chewable tablet Chew 1,000 mg by mouth daily.    [provider]  cholecalciferol (VITAMIN D) 25 MCG (1000 UNIT) tablet Take 1,000 Units by mouth daily.    [provider]  diclofenac sodium (VOLTAREN) 1 % GEL APPLY 2 GRAMS TOPICALLY FOUR TIMES A DAY TO AFFECTED AREAS 05/24/18   Tower, Audrie Gallus, MD  estradiol (ESTRACE) 0.1 MG/GM vaginal cream INSERT SMALL AMOUNT (1 CM) VAGINALLY EVERY OTHER DAY 07/04/22   Tower, Audrie Gallus, MD  levothyroxine (SYNTHROID) 75 MCG tablet Take 1 tablet (75 mcg total) by mouth daily. 03/13/22   Tower, Audrie Gallus, MD  linaclotide [facility]) 145 MCG CAPS capsule Take 1 capsule (145 mcg total) by mouth as needed. 03/13/22   Tower, Audrie Gallus, MD  metoprolol tartrate (LOPRESSOR) 25 MG tablet Take 25 mg by mouth daily.    [provider]  mometasone (NASONEX) 50 MCG/ACT nasal spray Place 2 sprays into the nose daily as needed.    [provider]  omeprazole (PRILOSEC) 40 MG capsule Take 1 capsule (40 mg total) by mouth daily. 03/13/22   Tower, Audrie Gallus, MD  spironolactone (ALDACTONE) 25 MG tablet Take 12.5 mg by mouth in the morning.    [provider]  sucralfate (CARAFATE) 1 g tablet Take 1 g by mouth 4 (four) times daily as needed.    [provider]    Family History Family History  Problem Relation Age of Onset   Diabetes Paternal Aunt    Breast  cancer Paternal Aunt    Hypertension Maternal Grandmother    Hypertension Maternal Grandfather     Social History Social History   Tobacco Use   Smoking status: Never    Passive exposure: Never   Smokeless tobacco: Never  Vaping Use   Vaping Use: Never used  Substance Use Topics   Alcohol use: Yes    Comment: Average 1 glass of wine/week   Drug use: Yes    Types: Marijuana    Comment: CBD drops     Allergies   Aspirin, Nsaids, and Tolmetin   Review of Systems Review of Systems  Constitutional:  Negative for chills and fever.  Musculoskeletal:  Negative for gait  problem and joint swelling.  Skin:  Positive for color change and wound.  Neurological:  Negative for weakness and numbness.     Physical Exam Triage Vital Signs ED Triage Vitals  Enc Vitals Group     BP --      Pulse Rate 02/02/23 0827 63     Resp 02/02/23 0827 18     Temp 02/02/23 0827 98 F (36.7 C)     Temp src --      SpO2 02/02/23 0827 96 %     Weight --      Height --      Head Circumference --      Peak Flow --      Pain Score 02/02/23 0835 4     Pain Loc --      Pain Edu? --      Excl. in GC? --    No data found.  Updated Vital Signs BP 125/75   Pulse 63   Temp 98 F (36.7 C)   Resp 18   SpO2 96%   Visual Acuity Right Eye Distance:   Left Eye Distance:   Bilateral Distance:    Right Eye Near:   Left Eye Near:    Bilateral Near:     Physical Exam Constitutional:      General: She is not in acute distress. Cardiovascular:     Rate and Rhythm: Normal rate and regular rhythm.  Pulmonary:     Effort: Pulmonary effort is normal. No respiratory distress.  Musculoskeletal:        General: No swelling or deformity. Normal range of motion.  Skin:    General: Skin is warm and dry.     Findings: Erythema and lesion present.     Comments: Mild localized erythema around skin tear on RLE.  See picture.    Neurological:     General: No focal deficit present.     Mental Status:  She is alert and oriented to person, place, and time.     Sensory: No sensory deficit.     Motor: No weakness.     Gait: Gait normal.  Psychiatric:        Mood and Affect: Mood normal.        Behavior: Behavior normal.         UC Treatments / Results  Labs (all labs ordered are listed, but only abnormal results are displayed) Labs Reviewed - No data to display  EKG   Radiology No results found.  Procedures Procedures (including critical care time)  Medications Ordered in UC Medications - No data to display  Initial Impression / Assessment and Plan / UC Course  I have reviewed the triage vital signs and the nursing notes.  Pertinent labs & imaging results that were available during my care of the patient were reviewed by me and considered in my medical decision making (see chart for details).   Cellulitis of right lower leg, multiple skin tears.  Afebrile and vital signs are stable.  Patient has mild localized erythema around one of the skin tears on her right lower leg.  Treating with cephalexin.  Reinforced Dermabond on all skin tears as patient reports some leaking.  Patient has an appointment to follow-up with her PCP on 02/07/2023.  Instructed her to return here or follow-up with her PCP if she notes concerning symptoms.  Education provided on cellulitis.  She agrees to plan of care.   Final Clinical Impressions(s) / UC Diagnoses  Final diagnoses:  Cellulitis of right lower leg  Multiple skin tears     Discharge Instructions      Take the Keflex as directed.  Follow up with your primary care provider.        ED Prescriptions     Medication Sig Dispense Auth. Provider   cephALEXin (KEFLEX) 500 MG capsule Take 1 capsule (500 mg total) by mouth 3 (three) times daily. 21 capsule Mickie Bail, NP      PDMP not reviewed this encounter.   Mickie Bail, NP 02/02/23 [phone]

## 2023-02-07 ENCOUNTER — Ambulatory Visit (INDEPENDENT_AMBULATORY_CARE_PROVIDER_SITE_OTHER): Payer: Medicare HMO | Admitting: Family Medicine

## 2023-02-07 ENCOUNTER — Encounter: Payer: Self-pay | Admitting: Family Medicine

## 2023-02-07 VITALS — BP 130/68 | HR 68 | Temp 97.8°F | Ht 65.0 in | Wt 128.5 lb

## 2023-02-07 DIAGNOSIS — Z8673 Personal history of transient ischemic attack (TIA), and cerebral infarction without residual deficits: Secondary | ICD-10-CM

## 2023-02-07 DIAGNOSIS — W19XXXA Unspecified fall, initial encounter: Secondary | ICD-10-CM | POA: Diagnosis not present

## 2023-02-07 DIAGNOSIS — S81811A Laceration without foreign body, right lower leg, initial encounter: Secondary | ICD-10-CM | POA: Diagnosis not present

## 2023-02-07 DIAGNOSIS — Y92009 Unspecified place in unspecified non-institutional (private) residence as the place of occurrence of the external cause: Secondary | ICD-10-CM

## 2023-02-07 DIAGNOSIS — I10 Essential (primary) hypertension: Secondary | ICD-10-CM

## 2023-02-07 NOTE — Progress Notes (Signed)
Subjective:    Patient ID: Sarah Harper, female    DOB: 1941/02/13, 82 y.o.   MRN: [id number]  HPI  Wt Readings from Last 3 Encounters:  02/07/23 128 lb 8 oz (58.3 kg)  01/29/23 129 lb 9.6 oz (58.8 kg)  12/26/22 129 lb 4 oz (58.6 kg)   21.38 kg/m  Vitals:   02/07/23 1113 02/07/23 1135  BP: (!) 146/76 130/68  Pulse: 68   Temp: 97.8 F (36.6 C)   SpO2: 98%     Pt presents for follow up of fall at home, contusions, skin tears and HTN Following up from 2 UC visits for fall   bp is stable today  No cp or palpitations or headaches or edema  No side effects to medicines  BP Readings from Last 3 Encounters:  02/07/23 130/68  02/02/23 125/75  01/30/23 (!) 144/86     Amlodipine 5 mg at night  Metoprolol 25 mg - is going to try 12.5 mg twice daily  Spironolactone in am 12.5 mg daily    Pt presents for follow up of skin tear/infection on right lower leg   (this occurred after a fall in her attic)  Hit leg and face on wood joists and had bruises also -no LOC  Tdap was updated on 6/11 Dx with cellulitis of RLL with multiple skin tears  Had UC visits on 6/11 and 6/14  Treating with cephalexin Reinforced dermabondond skin tears for leaking   The wounds are feeling tight /scabbing   Better overall  Not throbbing  Not keeping up at night No increase in redness or swelling   Face feels ok    She has h/o senile purpura and asa use 81 mg daily  On for a past stroke episode   Lab Results  Component Value Date   CHOL 181 03/08/2022   HDL 67.50 03/08/2022   LDLCALC 101 (H) 03/08/2022   LDLDIRECT 115.5 02/12/2013   TRIG 63.0 03/08/2022   CHOLHDL 3 03/08/2022     Patient Active Problem List   Diagnosis Date Noted   Pain in joint, lower leg 01/30/2023   Senile purpura (HCC) 12/26/2022   Skin tear of right lower leg without complication 12/26/2022   Elevated alkaline phosphatase level 03/11/2021   Palpitations 11/10/2020   Migraine    Hypertension, essential     H/O: CVA (cerebrovascular accident) 11/08/2020   Headache 09/26/2019   Fall at home 06/02/2019   Colon cancer screening 02/05/2018   Degenerative joint disease (DJD) of lumbar spine 05/02/2017   Internal hemorrhoids 05/02/2017   Generalized osteoarthritis of hand 12/11/2016   GAD (generalized anxiety disorder) 12/11/2016   Estrogen deficiency 05/11/2016   Encounter for screening mammogram for breast cancer 05/11/2016   OSA (obstructive sleep apnea) 03/06/2016   Fatigue 01/25/2016   Left knee pain 10/18/2015   Snoring 10/18/2015   Routine general medical examination at a health care facility 02/28/2015   Encounter for Medicare annual wellness exam 02/06/2013   Hyperlipidemia 08/27/2012   Irregular heart beat 08/05/2012   Gynecological examination 12/20/2010   BARRETTS ESOPHAGUS 10/01/2010   Osteopenia 12/17/2009   BACK PAIN, LUMBAR 07/02/2009   IRRITABLE BOWEL SYNDROME 04/02/2009   POSTMENOPAUSAL STATUS 12/10/2008   Hypothyroidism 12/05/2007   Meniere's disease 12/05/2007   GERD 12/05/2007   Past Medical History:  Diagnosis Date   Anxiety    Cataract 1997   Colon cancer screening 02/05/2018   DDD (degenerative disc disease)    chronic back pain  ETD (eustachian tube dysfunction)    GERD (gastroesophageal reflux disease)    Hypertension 2022   IBS (irritable bowel syndrome)    Meniere's disease    Migraine    still gets visual aura from time to time   Mild cognitive impairment    Osteoporosis    Sleep apnea    CPAP   Stroke (HCC) 2022   Syncopal episodes    after work-up - possible seizures?   Thyroid disease    hypothyroid   TIA (transient ischemic attack) 11/09/2020   Past Surgical History:  Procedure Laterality Date   APPENDECTOMY     CATARACT EXTRACTION W/PHACO Right 05/21/2019   Procedure: CATARACT EXTRACTION PHACO AND INTRAOCULAR LENS PLACEMENT (IOC) RIGHT panoptix lens  00:35.0  21.7%  7.61;  Surgeon: Lockie Mola, MD;  Location: [facility];  Service: Ophthalmology;  Laterality: Right;  sleep apnea requests early   CATARACT EXTRACTION W/PHACO Left 06/11/2019   Procedure: CATARACT EXTRACTION PHACO AND INTRAOCULAR LENS PLACEMENT (IOC) LEFT PANOPTIX TORIC LENS  00:50.0  20.3%  10.21;  Surgeon: Lockie Mola, MD;  Location: [facility];  Service: Ophthalmology;  Laterality: Left;  sleep apnea requests early   COLONOSCOPY     last 2012 - [facility]   ESOPHAGOGASTRODUODENOSCOPY     Multiple, last 01/07/2017 with Savary dilation to 18 mm   EYE SURGERY  August 2022   Cataracs removed   HEMORRHOID BANDING  2018   [facility]   LUMBAR DISC SURGERY  1984   L5    Social History   Tobacco Use   Smoking status: Never    Passive exposure: Never   Smokeless tobacco: Never  Vaping Use   Vaping Use: Never used  Substance Use Topics   Alcohol use: Yes    Comment: Average 1 glass of wine/week   Drug use: Yes    Types: Marijuana    Comment: CBD drops   Family History  Problem Relation Age of Onset   Diabetes Paternal Aunt    Breast cancer Paternal Aunt    Hypertension Maternal Grandmother    Hypertension Maternal Grandfather    Hypertension Sister    Allergies  Allergen Reactions   Aspirin Other (See Comments)    GI intolerance    Nsaids     GI discomfort and gastritis   Tolmetin Other (See Comments)    GI discomfort and gastritis   Current Outpatient Medications on File Prior to Visit  Medication Sig Dispense Refill   ALPRAZolam (XANAX) 0.25 MG tablet TAKE 1 TABLET BY MOUTH TWICE A DAY AS NEEDED FOR ANXIETY (CAUSES SEDATION) 30 tablet 0   AMBULATORY NON FORMULARY MEDICATION Take 35 mg by mouth in the morning and at bedtime. CBD drops 30 mg oral at breakfast and 25 mg oral at bedtime     amLODipine (NORVASC) 5 MG tablet Take 1 tablet (5 mg total) by mouth daily. Take 1/2 daily 45 tablet 3   aspirin EC 81 MG EC tablet Take 1 tablet (81 mg total) by mouth daily. Swallow whole. 30 tablet 11   B Complex-C  (SUPER B COMPLEX PO) Take 1 tablet by mouth daily.     butalbital-acetaminophen-caffeine (FIORICET) 50-325-40 MG tablet Take 1 tablet by mouth every 6 (six) hours as needed. 60 tablet 0   calcium elemental as carbonate (BARIATRIC TUMS ULTRA) 400 MG chewable tablet Chew 1,000 mg by mouth daily.     cephALEXin (KEFLEX) 500 MG capsule Take 1 capsule (500 mg total) by mouth  3 (three) times daily. 21 capsule 0   cholecalciferol (VITAMIN D) 25 MCG (1000 UNIT) tablet Take 1,000 Units by mouth daily.     diclofenac sodium (VOLTAREN) 1 % GEL APPLY 2 GRAMS TOPICALLY FOUR TIMES A DAY TO AFFECTED AREAS 300 g 3   estradiol (ESTRACE) 0.1 MG/GM vaginal cream INSERT SMALL AMOUNT (1 CM) VAGINALLY EVERY OTHER DAY 42.5 g 1   levothyroxine (SYNTHROID) 75 MCG tablet Take 1 tablet (75 mcg total) by mouth daily. 90 tablet 3   linaclotide (LINZESS) 145 MCG CAPS capsule Take 1 capsule (145 mcg total) by mouth as needed. 90 capsule 3   metoprolol tartrate (LOPRESSOR) 25 MG tablet Take 25 mg by mouth daily.     mometasone (NASONEX) 50 MCG/ACT nasal spray Place 2 sprays into the nose daily as needed.     omeprazole (PRILOSEC) 40 MG capsule Take 1 capsule (40 mg total) by mouth daily. 90 capsule 3   spironolactone (ALDACTONE) 25 MG tablet Take 12.5 mg by mouth in the morning.     sucralfate (CARAFATE) 1 g tablet Take 1 g by mouth 4 (four) times daily as needed.     No current facility-administered medications on file prior to visit.    Review of Systems  Constitutional:  Negative for activity change, appetite change, fatigue, fever and unexpected weight change.  HENT:  Negative for congestion, ear pain, rhinorrhea, sinus pressure and sore throat.   Eyes:  Negative for pain, redness and visual disturbance.  Respiratory:  Negative for cough, shortness of breath and wheezing.   Cardiovascular:  Negative for chest pain and palpitations.  Gastrointestinal:  Negative for abdominal pain, blood in stool, constipation, diarrhea  and nausea.  Endocrine: Negative for polydipsia and polyuria.  Genitourinary:  Negative for dysuria, frequency and urgency.  Musculoskeletal:  Negative for arthralgias, back pain and myalgias.  Skin:  Positive for wound. Negative for pallor and rash.  Allergic/Immunologic: Negative for environmental allergies.  Neurological:  Negative for dizziness, syncope, facial asymmetry, light-headedness and headaches.  Hematological:  Negative for adenopathy. Does not bruise/bleed easily.  Psychiatric/Behavioral:  Negative for decreased concentration and dysphoric mood. The patient is not nervous/anxious.        Objective:   Physical Exam Constitutional:      General: She is not in acute distress.    Appearance: Normal appearance. She is well-developed and normal weight. She is not ill-appearing or diaphoretic.  HENT:     Head: Normocephalic and atraumatic.     Comments: Bruising around right eye   No facial tenderness or crepitus      Right Ear: External ear normal.     Left Ear: External ear normal.     Mouth/Throat:     Mouth: Mucous membranes are moist.     Pharynx: Oropharynx is clear.  Eyes:     General: No scleral icterus.       Right eye: No discharge.        Left eye: No discharge.     Conjunctiva/sclera: Conjunctivae normal.     Pupils: Pupils are equal, round, and reactive to light.  Neck:     Thyroid: No thyromegaly.     Vascular: No carotid bruit or JVD.  Cardiovascular:     Rate and Rhythm: Normal rate and regular rhythm.     Heart sounds: Normal heart sounds.     No gallop.  Pulmonary:     Effort: Pulmonary effort is normal. No respiratory distress.     Breath sounds:  Normal breath sounds. No wheezing or rales.  Abdominal:     General: There is no distension or abdominal bruit.     Palpations: Abdomen is soft.  Musculoskeletal:     Cervical back: Normal range of motion and neck supple. No tenderness.     Right lower leg: No edema.     Left lower leg: No edema.   Lymphadenopathy:     Cervical: No cervical adenopathy.  Skin:    General: Skin is warm and dry.     Coloration: Skin is not pale.     Findings: No rash.     Comments: 2 skin tears on RLE-see picture  Decreased redness and swelling Surrounding yellow bruise (improved) Mild to no tenderness No active drainage- scabs are intact   Small ecchymosis areas around/under right eye - look to be old and yellowing also   Generally thin skin with some senile purpura  Neurological:     Mental Status: She is alert.     Coordination: Coordination normal.     Deep Tendon Reflexes: Reflexes are normal and symmetric. Reflexes normal.  Psychiatric:        Mood and Affect: Mood normal.        Cognition and Memory: Cognition and memory normal.           Assessment & Plan:   Problem List Items Addressed This Visit       Cardiovascular and Mediastinum   Hypertension, essential    Bp is better on 2nd check today  BP: 130/68  Per pt always lower at home and sometimes too low (90s/50s) Has d/w her cardiologist   Taking Amlodipine 5 mg at night  Metoprolol- 25 daily- is changing to 12.5 mg bid to even out control (not the XL) Spironolactone 12.5 mg in am  Watching for signs and symptoms of hypotension  Continues cardiology follow up         Musculoskeletal and Integument   Skin tear of right lower leg without complication - Primary    2 skin tears on RLE Reviewed UC notes from 6/11 and 6/14  Dermabond was used and areas clean and Tdap updated then  Keflex px for infection / prevention  Per pictures much improved  Symptomatically improved as well  Photo taken Instructed to continue cleaning with soap and water / do not submerge  Use pressure if any bleeding (takes asa) See AVS- discussed call back and ER precautions          Other   H/O: CVA (cerebrovascular accident)    Continues mono therapy with asa 81 mg daily (which does cause senile purpura and easier bleeding)  Bp  controlled  Last LDL of 101        Fall at home    Fall in attic Per pt tripped on wood joist on floor  Sustained skin tears on RLE and facial injury (mild) No signs and symptoms concussion- discussed what to watch for   Wounds on leg are healing   Discussed prevention of falls Handout given Would rather she stay out of attic in light of uneven floor surface

## 2023-02-07 NOTE — Assessment & Plan Note (Signed)
Continues mono therapy with asa 81 mg daily (which does cause senile purpura and easier bleeding)  Bp controlled  Last LDL of 101

## 2023-02-07 NOTE — Assessment & Plan Note (Signed)
Bp is better on 2nd check today  BP: 130/68  Per pt always lower at home and sometimes too low (90s/50s) Has d/w her cardiologist   Taking Amlodipine 5 mg at night  Metoprolol- 25 daily- is changing to 12.5 mg bid to even out control (not the XL) Spironolactone 12.5 mg in am  Watching for signs and symptoms of hypotension  Continues cardiology follow up

## 2023-02-07 NOTE — Assessment & Plan Note (Signed)
Fall in attic Per pt tripped on wood joist on floor  Sustained skin tears on RLE and facial injury (mild) No signs and symptoms concussion- discussed what to watch for   Wounds on leg are healing   Discussed prevention of falls Handout given Would rather she stay out of attic in light of uneven floor surface

## 2023-02-07 NOTE — Assessment & Plan Note (Signed)
2 skin tears on RLE Reviewed UC notes from 6/11 and 6/14  Dermabond was used and areas clean and Tdap updated then  Keflex px for infection / prevention  Per pictures much improved  Symptomatically improved as well  Photo taken Instructed to continue cleaning with soap and water / do not submerge  Use pressure if any bleeding (takes asa) See AVS- discussed call back and ER precautions

## 2023-02-07 NOTE — Patient Instructions (Addendum)
Your wounds are looking better  They do not look infected  Keep everything clean with soap and water  Elevate leg when you can  Do not submerge but a gentle shower is ok    Watch for increase in redness/pain or swelling   Watch for headache or nausea or dizziness

## 2023-02-13 ENCOUNTER — Ambulatory Visit (INDEPENDENT_AMBULATORY_CARE_PROVIDER_SITE_OTHER)
Admission: RE | Admit: 2023-02-13 | Discharge: 2023-02-13 | Disposition: A | Discharge status: Home / Self Care | Payer: Medicare HMO | Source: Ambulatory Visit | Attending: Family Medicine | Admitting: Family Medicine

## 2023-02-13 ENCOUNTER — Encounter: Payer: Self-pay | Admitting: Family Medicine

## 2023-02-13 ENCOUNTER — Ambulatory Visit (INDEPENDENT_AMBULATORY_CARE_PROVIDER_SITE_OTHER): Payer: Medicare HMO | Admitting: Family Medicine

## 2023-02-13 VITALS — BP 140/80 | HR 62 | Temp 98.5°F | Ht 65.0 in | Wt 130.0 lb

## 2023-02-13 DIAGNOSIS — S9031XA Contusion of right foot, initial encounter: Secondary | ICD-10-CM

## 2023-02-13 DIAGNOSIS — S81811A Laceration without foreign body, right lower leg, initial encounter: Secondary | ICD-10-CM

## 2023-02-13 NOTE — Patient Instructions (Addendum)
Elevate feet when you can  Use your compression hose once wounds are healed  You can use a gentle ace bandage  Let's check a foot xray today  We will contact you with the xray report   The swelling and bruising may be due to healing leg wounds as well   A cool compress is ok if needed

## 2023-02-13 NOTE — Assessment & Plan Note (Signed)
Wounds look to be healing well  Clean with no signs of infection  Encouraged her to continue wound care  No supp hose yet but hopefully will be able to wear them soon

## 2023-02-13 NOTE — Progress Notes (Signed)
Subjective:    Patient ID: Sarah Harper, female    DOB: 1940-10-07, 82 y.o.   MRN: [id number]  HPI  Wt Readings from Last 3 Encounters:  02/13/23 130 lb (59 kg)  02/07/23 128 lb 8 oz (58.3 kg)  01/29/23 129 lb 9.6 oz (58.8 kg)   21.63 kg/m  Vitals:   02/13/23 1155  BP: (!) 140/80  Pulse: 62  Temp: 98.5 F (36.9 C)  SpO2: 97%    Pt presents for a bruise on her ankle Also swelling in right ankle   Wounds on leg are scabs and healing   Swelling since the fall  Puts feet up when she can   More bruising also  Some itching but not painful     Normally wears comp stockings twice weekly - not recently due to scabs   XR DG Foot Complete Right  Result Date: 02/13/2023 CLINICAL DATA:  Lateral foot pain and swelling EXAM: RIGHT FOOT COMPLETE - 3+ VIEW COMPARISON:  01/12/2020 FINDINGS: There is no evidence of fracture or dislocation. No erosion or periosteal elevation. Similar mild degenerative changes of the foot, most pronounced at the first MTP joint. Soft tissues are unremarkable. IMPRESSION: No acute findings. Electronically Signed   By: Duanne Guess D.O.   On: 02/13/2023 12:43   DG Tibia/Fibula Right  Result Date: 01/30/2023 CLINICAL DATA:  Right leg pain. Stepped through the floor of attic. Multiple skin tears. EXAM: RIGHT TIBIA AND FIBULA - 2 VIEW COMPARISON:  None Available. FINDINGS: The knee and ankle joints are maintained. Mild degenerative changes and chondrocalcinosis noted. No acute fracture of the tibia or fibula is identified. No radiopaque foreign bodies. IMPRESSION: 1. No acute bony findings. 2. No radiopaque foreign body. Electronically Signed   By: Rudie Meyer M.D.   On: 01/30/2023 12:17       Patient Active Problem List   Diagnosis Date Noted   Traumatic ecchymosis of right foot 02/13/2023   Pain in joint, lower leg 01/30/2023   Senile purpura (HCC) 12/26/2022   Skin tear of right lower leg without complication 12/26/2022   Elevated  alkaline phosphatase level 03/11/2021   Palpitations 11/10/2020   Migraine    Hypertension, essential    H/O: CVA (cerebrovascular accident) 11/08/2020   Headache 09/26/2019   Fall at home 06/02/2019   Colon cancer screening 02/05/2018   Degenerative joint disease (DJD) of lumbar spine 05/02/2017   Internal hemorrhoids 05/02/2017   Generalized osteoarthritis of hand 12/11/2016   GAD (generalized anxiety disorder) 12/11/2016   Estrogen deficiency 05/11/2016   Encounter for screening mammogram for breast cancer 05/11/2016   OSA (obstructive sleep apnea) 03/06/2016   Fatigue 01/25/2016   Left knee pain 10/18/2015   Snoring 10/18/2015   Routine general medical examination at a health care facility 02/28/2015   Encounter for Medicare annual wellness exam 02/06/2013   Hyperlipidemia 08/27/2012   Irregular heart beat 08/05/2012   Gynecological examination 12/20/2010   BARRETTS ESOPHAGUS 10/01/2010   Osteopenia 12/17/2009   BACK PAIN, LUMBAR 07/02/2009   IRRITABLE BOWEL SYNDROME 04/02/2009   POSTMENOPAUSAL STATUS 12/10/2008   Hypothyroidism 12/05/2007   Meniere's disease 12/05/2007   GERD 12/05/2007   Past Medical History:  Diagnosis Date   Anxiety    Cataract 1997   Colon cancer screening 02/05/2018   DDD (degenerative disc disease)    chronic back pain   ETD (eustachian tube dysfunction)    GERD (gastroesophageal reflux disease)    Hypertension 2022   IBS (irritable  bowel syndrome)    Meniere's disease    Migraine    still gets visual aura from time to time   Mild cognitive impairment    Osteoporosis    Sleep apnea    CPAP   Stroke [facility]) 2022   Syncopal episodes    after work-up - possible seizures?   Thyroid disease    hypothyroid   TIA (transient ischemic attack) 11/09/2020   Past Surgical History:  Procedure Laterality Date   APPENDECTOMY     CATARACT EXTRACTION W/PHACO Right 05/21/2019   Procedure: CATARACT EXTRACTION PHACO AND INTRAOCULAR LENS PLACEMENT  (IOC) RIGHT panoptix lens  00:35.0  21.7%  7.61;  Surgeon: Lockie Mola, MD;  Location: [facility];  Service: Ophthalmology;  Laterality: Right;  sleep apnea requests early   CATARACT EXTRACTION W/PHACO Left 06/11/2019   Procedure: CATARACT EXTRACTION PHACO AND INTRAOCULAR LENS PLACEMENT (IOC) LEFT PANOPTIX TORIC LENS  00:50.0  20.3%  10.21;  Surgeon: Lockie Mola, MD;  Location: [facility];  Service: Ophthalmology;  Laterality: Left;  sleep apnea requests early   COLONOSCOPY     last 2012 - [facility]   ESOPHAGOGASTRODUODENOSCOPY     Multiple, last 01/07/2017 with Savary dilation to 18 mm   EYE SURGERY  August 2022   Cataracs removed   HEMORRHOID BANDING  2018   [facility]   LUMBAR DISC SURGERY  1984   L5    Social History   Tobacco Use   Smoking status: Never    Passive exposure: Never   Smokeless tobacco: Never  Vaping Use   Vaping Use: Never used  Substance Use Topics   Alcohol use: Yes    Comment: Average 1 glass of wine/week   Drug use: Yes    Types: Marijuana    Comment: CBD drops   Family History  Problem Relation Age of Onset   Diabetes Paternal Aunt    Breast cancer Paternal Aunt    Hypertension Maternal Grandmother    Hypertension Maternal Grandfather    Hypertension Sister    Allergies  Allergen Reactions   Aspirin Other (See Comments)    GI intolerance    Nsaids     GI discomfort and gastritis   Tolmetin Other (See Comments)    GI discomfort and gastritis   Current Outpatient Medications on File Prior to Visit  Medication Sig Dispense Refill   ALPRAZolam (XANAX) 0.25 MG tablet TAKE 1 TABLET BY MOUTH TWICE A DAY AS NEEDED FOR ANXIETY (CAUSES SEDATION) 30 tablet 0   AMBULATORY NON FORMULARY MEDICATION Take 35 mg by mouth in the morning and at bedtime. CBD drops 30 mg oral at breakfast and 25 mg oral at bedtime     amLODipine (NORVASC) 5 MG tablet Take 1 tablet (5 mg total) by mouth daily. Take 1/2 daily 45 tablet 3   aspirin EC  81 MG EC tablet Take 1 tablet (81 mg total) by mouth daily. Swallow whole. 30 tablet 11   B Complex-C (SUPER B COMPLEX PO) Take 1 tablet by mouth daily.     butalbital-acetaminophen-caffeine (FIORICET) 50-325-40 MG tablet Take 1 tablet by mouth every 6 (six) hours as needed. 60 tablet 0   calcium elemental as carbonate (BARIATRIC TUMS ULTRA) 400 MG chewable tablet Chew 1,000 mg by mouth daily.     cholecalciferol (VITAMIN D) 25 MCG (1000 UNIT) tablet Take 1,000 Units by mouth daily.     diclofenac sodium (VOLTAREN) 1 % GEL APPLY 2 GRAMS TOPICALLY FOUR TIMES A DAY TO  AFFECTED AREAS 300 g 3   estradiol (ESTRACE) 0.1 MG/GM vaginal cream INSERT SMALL AMOUNT (1 CM) VAGINALLY EVERY OTHER DAY 42.5 g 1   levothyroxine (SYNTHROID) 75 MCG tablet Take 1 tablet (75 mcg total) by mouth daily. 90 tablet 3   linaclotide (LINZESS) 145 MCG CAPS capsule Take 1 capsule (145 mcg total) by mouth as needed. 90 capsule 3   metoprolol tartrate (LOPRESSOR) 25 MG tablet Take 25 mg by mouth daily.     mometasone (NASONEX) 50 MCG/ACT nasal spray Place 2 sprays into the nose daily as needed.     omeprazole (PRILOSEC) 40 MG capsule Take 1 capsule (40 mg total) by mouth daily. 90 capsule 3   spironolactone (ALDACTONE) 25 MG tablet Take 12.5 mg by mouth in the morning.     sucralfate (CARAFATE) 1 g tablet Take 1 g by mouth 4 (four) times daily as needed.     No current facility-administered medications on file prior to visit.    Review of Systems  Constitutional:  Negative for fatigue.  Cardiovascular:  Positive for leg swelling. Negative for chest pain.  Musculoskeletal:        Right foot swelling with some bruising   Skin:  Positive for wound.  Neurological:  Negative for dizziness.       Objective:   Physical Exam Constitutional:      Appearance: Normal appearance. She is normal weight.  Cardiovascular:     Rate and Rhythm: Normal rate and regular rhythm.     Pulses: Normal pulses.  Musculoskeletal:     Right  lower leg: No edema.     Left lower leg: No edema.     Comments: Right foot  Some medial ecchymosis and swelling  Tender in proximal lateral MTPs   Normal rom  Normal gait  Can bear weight w/o problem  Varicosities noted   Skin:    Findings: Bruising present.     Comments: Leg wounds are starting to dry up/ scab  No redness No drainage  Improved overall     Neurological:     Mental Status: She is alert.           Assessment & Plan:   Problem List Items Addressed This Visit       Musculoskeletal and Integument   Skin tear of right lower leg without complication    Wounds look to be healing well  Clean with no signs of infection  Encouraged her to continue wound care  No supp hose yet but hopefully will be able to wear them soon        Other   Traumatic ecchymosis of right foot - Primary    Some extra swelling and ecchymosis in right foot -suspect from the upper leg injury and healing There was a small amt of tenderness in lateral proximal MTPs today  Foot xray ordered : no acute bony findings -reassuring   Unable to wear her supp hose until scabs (healing well ) are healed more on leg  Could try gentle ace bandage  Cool compress prn  Encouraged to continue elevating        Relevant Orders   DG Foot Complete Right (Completed)

## 2023-02-13 NOTE — Assessment & Plan Note (Addendum)
Some extra swelling and ecchymosis in right foot -suspect from the upper leg injury and healing There was a small amt of tenderness in lateral proximal MTPs today  Foot xray ordered : no acute bony findings -reassuring   Unable to wear her supp hose until scabs (healing well ) are healed more on leg  Could try gentle ace bandage  Cool compress prn  Encouraged to continue elevating

## 2023-03-05 ENCOUNTER — Ambulatory Visit
Admission: RE | Admit: 2023-03-05 | Discharge: 2023-03-05 | Disposition: A | Discharge status: Home / Self Care | Payer: Medicare HMO | Source: Ambulatory Visit | Attending: Family Medicine | Admitting: Family Medicine

## 2023-03-05 DIAGNOSIS — Z1231 Encounter for screening mammogram for malignant neoplasm of breast: Secondary | ICD-10-CM | POA: Insufficient documentation

## 2023-03-13 ENCOUNTER — Telehealth: Payer: Self-pay | Admitting: Family Medicine

## 2023-03-13 ENCOUNTER — Encounter: Payer: Medicare HMO | Admitting: Family Medicine

## 2023-03-13 ENCOUNTER — Other Ambulatory Visit: Payer: Medicare HMO

## 2023-03-13 DIAGNOSIS — E7800 Pure hypercholesterolemia, unspecified: Secondary | ICD-10-CM | POA: Diagnosis not present

## 2023-03-13 DIAGNOSIS — E039 Hypothyroidism, unspecified: Secondary | ICD-10-CM

## 2023-03-13 DIAGNOSIS — I10 Essential (primary) hypertension: Secondary | ICD-10-CM | POA: Diagnosis not present

## 2023-03-13 DIAGNOSIS — Z79899 Other long term (current) drug therapy: Secondary | ICD-10-CM

## 2023-03-13 LAB — TSH: TSH: 0.92 u[IU]/mL (ref 0.35–5.50)

## 2023-03-13 LAB — CBC WITH DIFFERENTIAL/PLATELET
Basophils Absolute: 0.1 10*3/uL (ref 0.0–0.1)
Basophils Relative: 1 % (ref 0.0–3.0)
Eosinophils Absolute: 0.2 10*3/uL (ref 0.0–0.7)
Eosinophils Relative: 2.9 % (ref 0.0–5.0)
HCT: 38.9 % (ref 36.0–46.0)
Hemoglobin: 12.6 g/dL (ref 12.0–15.0)
Lymphocytes Relative: 29.4 % (ref 12.0–46.0)
Lymphs Abs: 1.8 10*3/uL (ref 0.7–4.0)
MCHC: 32.5 g/dL (ref 30.0–36.0)
MCV: 88.3 fl (ref 78.0–100.0)
Monocytes Absolute: 0.6 10*3/uL (ref 0.1–1.0)
Monocytes Relative: 9.8 % (ref 3.0–12.0)
Neutro Abs: 3.5 10*3/uL (ref 1.4–7.7)
Neutrophils Relative %: 56.9 % (ref 43.0–77.0)
Platelets: 291 10*3/uL (ref 150.0–400.0)
RBC: 4.41 Mil/uL (ref 3.87–5.11)
RDW: 14 % (ref 11.5–15.5)
WBC: 6.2 10*3/uL (ref 4.0–10.5)

## 2023-03-13 LAB — COMPREHENSIVE METABOLIC PANEL
ALT: 14 U/L (ref 0–35)
AST: 19 U/L (ref 0–37)
Albumin: 4.1 g/dL (ref 3.5–5.2)
Alkaline Phosphatase: 104 U/L (ref 39–117)
BUN: 15 mg/dL (ref 6–23)
CO2: 32 mEq/L (ref 19–32)
Calcium: 9.8 mg/dL (ref 8.4–10.5)
Chloride: 96 mEq/L (ref 96–112)
Creatinine, Ser: 0.7 mg/dL (ref 0.40–1.20)
GFR: 80.92 mL/min (ref >60.00)
Glucose, Bld: 92 mg/dL (ref 70–99)
Potassium: 4.4 mEq/L (ref 3.5–5.1)
Sodium: 133 mEq/L — ABNORMAL LOW (ref 135–145)
Total Bilirubin: 0.6 mg/dL (ref 0.2–1.2)
Total Protein: 6.6 g/dL (ref 6.0–8.3)

## 2023-03-13 LAB — LIPID PANEL
Cholesterol: 184 mg/dL (ref 0–200)
HDL: 72.9 mg/dL (ref >39.00)
LDL Cholesterol: 99 mg/dL (ref 0–99)
NonHDL: 111.05
Total CHOL/HDL Ratio: 3
Triglycerides: 62 mg/dL (ref 0.0–149.0)
VLDL: 12.4 mg/dL (ref 0.0–40.0)

## 2023-03-13 LAB — VITAMIN B12: Vitamin B-12: 535 pg/mL (ref 211–911)

## 2023-03-13 LAB — VITAMIN D 25 HYDROXY (VIT D DEFICIENCY, FRACTURES): VITD: 33.72 ng/mL (ref 30.00–100.00)

## 2023-03-13 NOTE — Telephone Encounter (Signed)
Lab orders for late add on

## 2023-03-15 ENCOUNTER — Encounter: Payer: Self-pay | Admitting: Internal Medicine

## 2023-03-15 ENCOUNTER — Ambulatory Visit: Payer: Medicare HMO | Admitting: Internal Medicine

## 2023-03-15 VITALS — BP 122/70 | HR 71 | Ht 65.0 in | Wt 129.0 lb

## 2023-03-15 DIAGNOSIS — K5909 Other constipation: Secondary | ICD-10-CM

## 2023-03-15 DIAGNOSIS — K219 Gastro-esophageal reflux disease without esophagitis: Secondary | ICD-10-CM | POA: Diagnosis not present

## 2023-03-15 MED ORDER — FAMOTIDINE 40 MG PO TABS: 40.0000 mg | ORAL_TABLET | Freq: Every day | ORAL | 3 refills | Status: DC

## 2023-03-15 NOTE — Progress Notes (Signed)
Sarah Harper 81 y.o. 1941/08/04 [id number]  Assessment & Plan:   Encounter Diagnoses  Name Primary?   Gastroesophageal reflux disease, unspecified whether esophagitis present Yes   Chronic constipation     Meds ordered this encounter  Medications   famotidine (PEPCID) 40 MG tablet    Sig: Take 1 tablet (40 mg total) by mouth at bedtime.    Dispense:  90 tablet    Refill:  3    Continue as needed Linzess.  She is to message me if this treatment plan is not helpful.  Would consider repeating an EGD if still symptomatic despite nocturnal Pepcid.  Note that she stopped taking her coated aspirin at bedtime but that has not made a difference.  We talked about that she may be on that from a prior stroke or TIA.  If it is just for primary prevention we reviewed how that has fallen out of favor but she should check with Dr. Milinda Antis about this before stopping it. Subjective:   Chief Complaint: Heartburn  HPI 82 year old white woman with a history of GERD and IBS, on omeprazole and Linzess who has been having early morning heartburn issues.  Drinking water will alleviated somewhat.  She waits 4 hours between last meal and going to bed and head of bed is elevated.  She drinks a little bit of caffeine in the form of green tea in the morning and an occasional diet Dr. Reino Kent at lunch.  She does not smoke.  Previous patient of Dr. Kinnie Scales with last EGD and 18 mm Savary dilation in 2018, small to medium hiatal hernia as well.  In the past she has been on Dexilant at 1 point.  She was last seen here in October 2022 for constipation issues but does not report issues with those today.  There is no dysphagia at this time.  Carafate as needed has been used as well during the daytime.  She really does not seem to have daytime symptoms. Wt Readings from Last 3 Encounters:  03/15/23 129 lb (58.5 kg)  02/13/23 130 lb (59 kg)  02/07/23 128 lb 8 oz (58.3 kg)    Allergies  Allergen Reactions    Aspirin Other (See Comments)    GI intolerance    Nsaids     GI discomfort and gastritis   Tolmetin Other (See Comments)    GI discomfort and gastritis   Current Meds  Medication Sig   ALPRAZolam (XANAX) 0.25 MG tablet TAKE 1 TABLET BY MOUTH TWICE A DAY AS NEEDED FOR ANXIETY (CAUSES SEDATION)   AMBULATORY NON FORMULARY MEDICATION Take 35 mg by mouth in the morning and at bedtime. CBD drops 30 mg oral at breakfast and 25 mg oral at bedtime   amLODipine (NORVASC) 5 MG tablet Take 1 tablet (5 mg total) by mouth daily. Take 1/2 daily   B Complex-C (SUPER B COMPLEX PO) Take 1 tablet by mouth daily.   butalbital-acetaminophen-caffeine (FIORICET) 50-325-40 MG tablet Take 1 tablet by mouth every 6 (six) hours as needed.   calcium elemental as carbonate (BARIATRIC TUMS ULTRA) 400 MG chewable tablet Chew 1,000 mg by mouth daily.   cholecalciferol (VITAMIN D) 25 MCG (1000 UNIT) tablet Take 1,000 Units by mouth daily.   diclofenac sodium (VOLTAREN) 1 % GEL APPLY 2 GRAMS TOPICALLY FOUR TIMES A DAY TO AFFECTED AREAS   estradiol (ESTRACE) 0.1 MG/GM vaginal cream INSERT SMALL AMOUNT (1 CM) VAGINALLY EVERY OTHER DAY   famotidine (PEPCID) 40 MG tablet Take 1  tablet (40 mg total) by mouth at bedtime.   levothyroxine (SYNTHROID) 75 MCG tablet Take 1 tablet (75 mcg total) by mouth daily.   linaclotide (LINZESS) 145 MCG CAPS capsule Take 1 capsule (145 mcg total) by mouth as needed.   mometasone (NASONEX) 50 MCG/ACT nasal spray Place 2 sprays into the nose daily as needed.   omeprazole (PRILOSEC) 40 MG capsule Take 1 capsule (40 mg total) by mouth daily.   spironolactone (ALDACTONE) 25 MG tablet Take 12.5 mg by mouth in the morning.   sucralfate (CARAFATE) 1 g tablet Take 1 g by mouth 4 (four) times daily as needed.   Past Medical History:  Diagnosis Date   Anxiety    Cataract 1997   Colon cancer screening 02/05/2018   DDD (degenerative disc disease)    chronic back pain   ETD (eustachian tube  dysfunction)    GERD (gastroesophageal reflux disease)    Hypertension 2022   IBS (irritable bowel syndrome)    Meniere's disease    Migraine    still gets visual aura from time to time   Mild cognitive impairment    Osteoporosis    Sleep apnea    CPAP   Stroke (HCC) 2022   Syncopal episodes    after work-up - possible seizures?   Thyroid disease    hypothyroid   TIA (transient ischemic attack) 11/09/2020   Past Surgical History:  Procedure Laterality Date   APPENDECTOMY     CATARACT EXTRACTION W/PHACO Right 05/21/2019   Procedure: CATARACT EXTRACTION PHACO AND INTRAOCULAR LENS PLACEMENT (IOC) RIGHT panoptix lens  00:35.0  21.7%  7.61;  Surgeon: Lockie Mola, MD;  Location: [facility];  Service: Ophthalmology;  Laterality: Right;  sleep apnea requests early   CATARACT EXTRACTION W/PHACO Left 06/11/2019   Procedure: CATARACT EXTRACTION PHACO AND INTRAOCULAR LENS PLACEMENT (IOC) LEFT PANOPTIX TORIC LENS  00:50.0  20.3%  10.21;  Surgeon: Lockie Mola, MD;  Location: [facility];  Service: Ophthalmology;  Laterality: Left;  sleep apnea requests early   COLONOSCOPY     last 2012 - [facility]   ESOPHAGOGASTRODUODENOSCOPY     Multiple, last 01/07/2017 with Savary dilation to 18 mm   EYE SURGERY  August 2022   Cataracs removed   HEMORRHOID BANDING  2018   [facility]   LUMBAR DISC SURGERY  1984   L5    Social History   Social History Narrative   Married, husband is a type I diabetic.  No children.   Elderly mother in her 90s lives in an assisted living facility.   Very occasional white wine with dinner, never smoker no drug use.  Limited caffeine.   family history includes Breast cancer in her paternal aunt; Diabetes in her paternal aunt; Hypertension in her maternal grandfather, maternal grandmother, and sister.   Review of Systems As above  Objective:   Physical Exam @BP  122/70   Pulse 71   Ht 5\' 5"  (1.651 m)   Wt 129 lb (58.5 kg)   BMI 21.47  kg/m @  General:  NAD Eyes:   anicteric Lungs:  clear Heart::  S1S2 no rubs, murmurs or gallops Abdomen:  soft and nontender, BS+ Ext:   no edema, cyanosis or clubbing    Data Reviewed:  As above

## 2023-03-15 NOTE — Patient Instructions (Signed)
We have sent the following medications to your pharmacy for you to pick up at your convenience: Famotidine  Please message [place] in a month if not doing better.   I appreciate the opportunity to care for you. Stan Head, MD, [facility]

## 2023-03-20 ENCOUNTER — Encounter: Payer: Self-pay | Admitting: Family Medicine

## 2023-03-20 ENCOUNTER — Ambulatory Visit (INDEPENDENT_AMBULATORY_CARE_PROVIDER_SITE_OTHER): Payer: Medicare HMO | Admitting: Family Medicine

## 2023-03-20 VITALS — BP 122/70 | HR 96 | Temp 97.6°F | Ht 64.0 in | Wt 127.0 lb

## 2023-03-20 DIAGNOSIS — K219 Gastro-esophageal reflux disease without esophagitis: Secondary | ICD-10-CM | POA: Diagnosis not present

## 2023-03-20 DIAGNOSIS — Z0000 Encounter for general adult medical examination without abnormal findings: Secondary | ICD-10-CM

## 2023-03-20 DIAGNOSIS — Z79899 Other long term (current) drug therapy: Secondary | ICD-10-CM

## 2023-03-20 DIAGNOSIS — I10 Essential (primary) hypertension: Secondary | ICD-10-CM

## 2023-03-20 DIAGNOSIS — Z1211 Encounter for screening for malignant neoplasm of colon: Secondary | ICD-10-CM

## 2023-03-20 DIAGNOSIS — G43109 Migraine with aura, not intractable, without status migrainosus: Secondary | ICD-10-CM

## 2023-03-20 DIAGNOSIS — E039 Hypothyroidism, unspecified: Secondary | ICD-10-CM

## 2023-03-20 DIAGNOSIS — M8589 Other specified disorders of bone density and structure, multiple sites: Secondary | ICD-10-CM

## 2023-03-20 DIAGNOSIS — E7800 Pure hypercholesterolemia, unspecified: Secondary | ICD-10-CM

## 2023-03-20 DIAGNOSIS — F411 Generalized anxiety disorder: Secondary | ICD-10-CM

## 2023-03-20 MED ORDER — AMLODIPINE BESYLATE 5 MG PO TABS: 5.0000 mg | ORAL_TABLET | Freq: Every day | ORAL | 3 refills | Status: DC

## 2023-03-20 MED ORDER — BUTALBITAL-APAP-CAFFEINE 50-325-40 MG PO TABS: 1.0000 | ORAL_TABLET | Freq: Four times a day (QID) | ORAL | 0 refills | Status: DC | PRN

## 2023-03-20 MED ORDER — ALPRAZOLAM 0.25 MG PO TABS: 0.2500 mg | ORAL_TABLET | Freq: Every day | ORAL | 0 refills | Status: DC | PRN

## 2023-03-20 MED ORDER — OMEPRAZOLE 40 MG PO CPDR: 40.0000 mg | DELAYED_RELEASE_CAPSULE | Freq: Every day | ORAL | 3 refills | Status: DC

## 2023-03-20 MED ORDER — LEVOTHYROXINE SODIUM 75 MCG PO TABS: 75.0000 ug | ORAL_TABLET | Freq: Every day | ORAL | 3 refills | Status: DC

## 2023-03-20 NOTE — Patient Instructions (Addendum)
Keep walking   Add some strength training to your routine, this is important for bone and brain health and can reduce your risk of falls and help your body use insulin properly and regulate weight  Light weights, exercise bands , and internet videos are a good way to start  Yoga (chair or regular), machines , floor exercises or a gym with machines are also good options     Start back on metoprolol 12.5 mg twice daily but hold the spironolactone  Continue the amlodipiine 1/2 pill daily  Let us know how that goes    If headaches do not improve let us know   Take care of yourself

## 2023-03-20 NOTE — Assessment & Plan Note (Signed)
With history of barrett's esophagus  On omeprazole 40 mg daily  Pepcid 40 mg at bedtime-newer and helping Carafate prn  Under GI care  B12 and D levels in normal range

## 2023-03-20 NOTE — Assessment & Plan Note (Signed)
This may be worse due to holding of metoprolol (takes for a fib) Pulse also elevated   Will re start this and hold spironolactone instead  Instructed to call and update -hope for relief soon  No change in neuro exam or status today  Call back and Er precautions noted in detail today

## 2023-03-20 NOTE — Assessment & Plan Note (Signed)
BP: 122/70   Holding metoprolol for low blood pressure (but headache returned) Asked her to re start this at 12.5 mg bid Hold spironolactone (on 12.5 mg daily) Continue amlodipine 2.5 mg daily   Update re: headache and blood pressure control

## 2023-03-20 NOTE — Assessment & Plan Note (Signed)
Cologuard utd for another year

## 2023-03-20 NOTE — Progress Notes (Signed)
Subjective:    Patient ID: Sarah Harper, female    DOB: 07-06-1941, 82 y.o.   MRN: [id number]  HPI  Here for health maintenance exam and to review chronic medical problems   Wt Readings from Last 3 Encounters:  03/20/23 127 lb (57.6 kg)  03/15/23 129 lb (58.5 kg)  02/13/23 130 lb (59 kg)   21.80 kg/m  Vitals:   03/20/23 0853  BP: 122/70  Pulse: 96  Temp: 97.6 F (36.4 C)  SpO2: 97%    Immunization History  Administered Date(s) Administered   Fluad Quad(high Dose 65+) 05/07/2019, 05/22/2020, 05/27/2021, 05/17/2022   Influenza Whole 05/22/2007, 05/21/2009   Influenza,inj,Quad PF,6+ Mos 06/11/2013, 05/28/2014, 05/14/2015, 05/12/2016, 05/10/2017, 05/16/2018   PFIZER(Purple Top)SARS-COV-2 Vaccination 09/12/2019, 10/03/2019, 04/24/2020   Pfizer Covid-19 Vaccine Bivalent Booster 67yrs & up 06/17/2021   Pneumococcal Conjugate-13 02/24/2014   Pneumococcal Polysaccharide-23 12/22/2011   Td 12/19/2001, 12/22/2011   Tdap 01/30/2023   Zoster Recombinant(Shingrix) 08/20/2021, 12/14/2021   Zoster, Live 11/12/2012    There are no preventive care reminders to display for this patient.  Leg wounds are healing   More headaches for the past week (after none for months)  Has oph migraines with squiggles and bright colorts (this happens after the headache starts)  Worsens with stress (stress of recent health problems and wounds)  No other symptoms  Takes tylenol and it helped  Usually once per day - daily or every other day  No stroke symptoms  ? Tylenol gives rebound  Took 1/2 fiorcet one day  Gets for 2-3 days then goes away in the past   Has eye exam planned this month  Using screens more    Is well hydrated Very little caffeine    Mammogram 02/2023  Self breast exam- no lumps   Colon cancer screening - neg cologuard 03/2021    Dexa  04/2022  osteopenia  Falls- one (in attic)  Fractures- none  Supplements vit D and tums  Last vitamin D Lab Results  Component  Value Date   VD25OH 33.72 03/13/2023    Exercise : walking and stretching  In past strength training hurt her back    Mood    03/20/2023    9:01 AM 12/26/2022    2:05 PM 12/25/2022    1:03 PM 12/21/2021   12:08 PM 03/19/2020    2:05 PM  Depression screen PHQ 2/9  Decreased Interest 0 0 0 0 1  Down, Depressed, Hopeless 0 0 0 0 1  PHQ - 2 Score 0 0 0 0 2  Altered sleeping 0 0   0  Tired, decreased energy 1 2   0  Change in appetite 0 0   0  Feeling bad or failure about yourself  0 0   0  Trouble concentrating 0 0   0  Moving slowly or fidgety/restless 0 0   0  Suicidal thoughts 0 0   0  PHQ-9 Score 1 2   2   Difficult doing work/chores Not difficult at all Somewhat difficult   Not difficult at all   HTN bp is stable today  No cp or palpitations or headaches or edema  No side effects to medicines  BP Readings from Last 3 Encounters:  03/20/23 122/70  03/15/23 122/70  02/13/23 (!) 140/80    Her blood pressure went low so she held metoprolol    Metoprolol 12.5 mg bid -stopped  Spironolactone 12.5 mg daily  Amlodipine 5 mg 1/2 pill daily  Pulse Readings from Last 3 Encounters:  03/20/23 96  03/15/23 71  02/13/23 62    Takes asa for history of cva   Continues cardiology follow up-not as often     GERD with barrett's  Sees GI / Dr Leone Payor (also for IBS and takex linzess)  Pepcid 40 mg at bedtime -for symptoms , has started to help  Omeprazole 40 mg daily  Carafate 1 g qid prn  Lab Results  Component Value Date   VITAMINB12 535 03/13/2023   Last vitamin D Lab Results  Component Value Date   VD25OH 33.72 03/13/2023     Hypothyroidism  Pt has no clinical changes No change in energy level/ hair or skin/ edema and no tremor Lab Results  Component Value Date   TSH 0.92 03/13/2023    Levothyroxine 75 mcg daily    Hyperlipidemia Lab Results  Component Value Date   CHOL 184 03/13/2023   CHOL 181 03/08/2022   CHOL 189 03/04/2021   Lab Results   Component Value Date   HDL 72.90 03/13/2023   HDL 67.50 03/08/2022   HDL 68.10 03/04/2021   Lab Results  Component Value Date   LDLCALC 99 03/13/2023   LDLCALC 101 (H) 03/08/2022   LDLCALC 109 (H) 03/04/2021   Lab Results  Component Value Date   TRIG 62.0 03/13/2023   TRIG 63.0 03/08/2022   TRIG 58.0 03/04/2021   Lab Results  Component Value Date   CHOLHDL 3 03/13/2023   CHOLHDL 3 03/08/2022   CHOLHDL 3 03/04/2021   Lab Results  Component Value Date   LDLDIRECT 115.5 02/12/2013   LDLDIRECT 109.9 12/19/2010   LDLDIRECT 117.7 12/17/2009   Statin intolerant  Eats heart healthy diet  She still ate ice cream    Patient Active Problem List   Diagnosis Date Noted   Current use of proton pump inhibitor 03/13/2023   Traumatic ecchymosis of right foot 02/13/2023   Pain in joint, lower leg 01/30/2023   Senile purpura (HCC) 12/26/2022   Skin tear of right lower leg without complication 12/26/2022   Elevated alkaline phosphatase level 03/11/2021   Palpitations 11/10/2020   Migraine    Hypertension, essential    H/O: CVA (cerebrovascular accident) 11/08/2020   Headache 09/26/2019   Fall at home 06/02/2019   Colon cancer screening 02/05/2018   Degenerative joint disease (DJD) of lumbar spine 05/02/2017   Internal hemorrhoids 05/02/2017   Generalized osteoarthritis of hand 12/11/2016   GAD (generalized anxiety disorder) 12/11/2016   Estrogen deficiency 05/11/2016   Encounter for screening mammogram for breast cancer 05/11/2016   OSA (obstructive sleep apnea) 03/06/2016   Fatigue 01/25/2016   Left knee pain 10/18/2015   Snoring 10/18/2015   Routine general medical examination at a health care facility 02/28/2015   Encounter for Medicare annual wellness exam 02/06/2013   Hyperlipidemia 08/27/2012   Irregular heart beat 08/05/2012   Gynecological examination 12/20/2010   Osteopenia 12/17/2009   BACK PAIN, LUMBAR 07/02/2009   IRRITABLE BOWEL SYNDROME 04/02/2009    POSTMENOPAUSAL STATUS 12/10/2008   Hypothyroidism 12/05/2007   Meniere's disease 12/05/2007   GERD 12/05/2007   Past Medical History:  Diagnosis Date   Anxiety    Cataract 1997   Colon cancer screening 02/05/2018   DDD (degenerative disc disease)    chronic back pain   ETD (eustachian tube dysfunction)    GERD (gastroesophageal reflux disease)    Hypertension 2022   IBS (irritable bowel syndrome)    Meniere's disease  Migraine    still gets visual aura from time to time   Mild cognitive impairment    Osteoporosis    Sleep apnea    CPAP   Stroke [facility]) 2022   Syncopal episodes    after work-up - possible seizures?   Thyroid disease    hypothyroid   TIA (transient ischemic attack) 11/09/2020   Past Surgical History:  Procedure Laterality Date   APPENDECTOMY     CATARACT EXTRACTION W/PHACO Right 05/21/2019   Procedure: CATARACT EXTRACTION PHACO AND INTRAOCULAR LENS PLACEMENT (IOC) RIGHT panoptix lens  00:35.0  21.7%  7.61;  Surgeon: Lockie Mola, MD;  Location: [facility];  Service: Ophthalmology;  Laterality: Right;  sleep apnea requests early   CATARACT EXTRACTION W/PHACO Left 06/11/2019   Procedure: CATARACT EXTRACTION PHACO AND INTRAOCULAR LENS PLACEMENT (IOC) LEFT PANOPTIX TORIC LENS  00:50.0  20.3%  10.21;  Surgeon: Lockie Mola, MD;  Location: [facility];  Service: Ophthalmology;  Laterality: Left;  sleep apnea requests early   COLONOSCOPY     last 2012 - [facility]   ESOPHAGOGASTRODUODENOSCOPY     Multiple, last 01/07/2017 with Savary dilation to 18 mm   EYE SURGERY  August 2022   Cataracs removed   HEMORRHOID BANDING  2018   [facility]   LUMBAR DISC SURGERY  1984   L5    Social History   Tobacco Use   Smoking status: Never    Passive exposure: Never   Smokeless tobacco: Never  Vaping Use   Vaping status: Never Used  Substance Use Topics   Alcohol use: Yes    Comment: Average 1 glass of wine/week   Drug use: Yes    Types:  Marijuana    Comment: CBD drops   Family History  Problem Relation Age of Onset   Diabetes Paternal Aunt    Breast cancer Paternal Aunt    Hypertension Maternal Grandmother    Hypertension Maternal Grandfather    Hypertension Sister    Allergies  Allergen Reactions   Aspirin Other (See Comments)    GI intolerance    Nsaids     GI discomfort and gastritis   Tolmetin Other (See Comments)    GI discomfort and gastritis   Current Outpatient Medications on File Prior to Visit  Medication Sig Dispense Refill   AMBULATORY NON FORMULARY MEDICATION Take 35 mg by mouth in the morning and at bedtime. CBD drops 30 mg oral at breakfast and 25 mg oral at bedtime     aspirin EC 81 MG EC tablet Take 1 tablet (81 mg total) by mouth daily. Swallow whole. 30 tablet 11   B Complex-C (SUPER B COMPLEX PO) Take 1 tablet by mouth daily.     calcium elemental as carbonate (BARIATRIC TUMS ULTRA) 400 MG chewable tablet Chew 1,000 mg by mouth daily.     cholecalciferol (VITAMIN D) 25 MCG (1000 UNIT) tablet Take 1,000 Units by mouth daily.     diclofenac sodium (VOLTAREN) 1 % GEL APPLY 2 GRAMS TOPICALLY FOUR TIMES A DAY TO AFFECTED AREAS 300 g 3   estradiol (ESTRACE) 0.1 MG/GM vaginal cream INSERT SMALL AMOUNT (1 CM) VAGINALLY EVERY OTHER DAY 42.5 g 1   famotidine (PEPCID) 40 MG tablet Take 1 tablet (40 mg total) by mouth at bedtime. 90 tablet 3   linaclotide (LINZESS) 145 MCG CAPS capsule Take 1 capsule (145 mcg total) by mouth as needed. 90 capsule 3   mometasone (NASONEX) 50 MCG/ACT nasal spray Place 2 sprays into  the nose daily as needed.     sucralfate (CARAFATE) 1 g tablet Take 1 g by mouth 4 (four) times daily as needed.     No current facility-administered medications on file prior to visit.    Review of Systems  Constitutional:  Positive for fatigue. Negative for activity change, appetite change, fever and unexpected weight change.  HENT:  Negative for congestion, ear pain, rhinorrhea, sinus  pressure and sore throat.   Eyes:  Negative for pain, redness and visual disturbance.  Respiratory:  Negative for cough, shortness of breath and wheezing.   Cardiovascular:  Negative for chest pain and palpitations.  Gastrointestinal:  Negative for abdominal pain, blood in stool, constipation and diarrhea.  Endocrine: Negative for polydipsia and polyuria.  Genitourinary:  Negative for dysuria, frequency and urgency.  Musculoskeletal:  Negative for arthralgias, back pain and myalgias.  Skin:  Negative for pallor and rash.  Allergic/Immunologic: Negative for environmental allergies.  Neurological:  Positive for headaches. Negative for dizziness and syncope.  Hematological:  Negative for adenopathy. Does not bruise/bleed easily.  Psychiatric/Behavioral:  Negative for decreased concentration and dysphoric mood. The patient is not nervous/anxious.        Objective:   Physical Exam Constitutional:      General: She is not in acute distress.    Appearance: Normal appearance. She is well-developed and normal weight. She is not ill-appearing or diaphoretic.  HENT:     Head: Normocephalic and atraumatic.     Right Ear: Tympanic membrane, ear canal and external ear normal.     Left Ear: Tympanic membrane, ear canal and external ear normal.     Nose: Nose normal. No congestion.     Mouth/Throat:     Mouth: Mucous membranes are moist.     Pharynx: Oropharynx is clear. No posterior oropharyngeal erythema.  Eyes:     General: No scleral icterus.    Extraocular Movements: Extraocular movements intact.     Conjunctiva/sclera: Conjunctivae normal.     Pupils: Pupils are equal, round, and reactive to light.  Neck:     Thyroid: No thyromegaly.     Vascular: No carotid bruit or JVD.  Cardiovascular:     Rate and Rhythm: Normal rate and regular rhythm.     Pulses: Normal pulses.     Heart sounds: Normal heart sounds.     No gallop.  Pulmonary:     Effort: Pulmonary effort is normal. No  respiratory distress.     Breath sounds: Normal breath sounds. No wheezing.     Comments: Good air exch Chest:     Chest wall: No tenderness.  Abdominal:     General: Bowel sounds are normal. There is no distension or abdominal bruit.     Palpations: Abdomen is soft. There is no mass.     Tenderness: There is no abdominal tenderness.     Hernia: No hernia is present.  Genitourinary:    Comments: Breast exam: No mass, nodules, thickening, tenderness, bulging, retraction, inflamation, nipple discharge or skin changes noted.  No axillary or clavicular LA.     Musculoskeletal:        General: No tenderness. Normal range of motion.     Cervical back: Normal range of motion and neck supple. No rigidity. No muscular tenderness.     Right lower leg: No edema.     Left lower leg: No edema.     Comments: No kyphosis   Lymphadenopathy:     Cervical: No cervical adenopathy.  Skin:    General: Skin is warm and dry.     Coloration: Skin is not pale.     Findings: No erythema or rash.     Comments: Scabs on skin tear wounds right leg - appear to be healing and not infected  Solar lentigines diffusely Scattered SKs  Neurological:     Mental Status: She is alert. Mental status is at baseline.     Cranial Nerves: No cranial nerve deficit.     Motor: No abnormal muscle tone.     Coordination: Coordination normal.     Gait: Gait normal.     Deep Tendon Reflexes: Reflexes are normal and symmetric. Reflexes normal.  Psychiatric:        Mood and Affect: Mood normal.        Cognition and Memory: Cognition and memory normal.           Assessment & Plan:   Problem List Items Addressed This Visit       Cardiovascular and Mediastinum   Migraine    This may be worse due to holding of metoprolol (takes for a fib) Pulse also elevated   Will re start this and hold spironolactone instead  Instructed to call and update -hope for relief soon  No change in neuro exam or status today  Call back  and Er precautions noted in detail today        Relevant Medications   butalbital-acetaminophen-caffeine (FIORICET) 50-325-40 MG tablet   amLODipine (NORVASC) 5 MG tablet   Hypertension, essential    BP: 122/70   Holding metoprolol for low blood pressure (but headache returned) Asked her to re start this at 12.5 mg bid Hold spironolactone (on 12.5 mg daily) Continue amlodipine 2.5 mg daily   Update re: headache and blood pressure control       Relevant Medications   amLODipine (NORVASC) 5 MG tablet     Digestive   GERD    With history of barrett's esophagus  On omeprazole 40 mg daily  Pepcid 40 mg at bedtime-newer and helping Carafate prn  Under GI care  B12 and D levels in normal range       Relevant Medications   omeprazole (PRILOSEC) 40 MG capsule     Endocrine   Hypothyroidism    Hypothyroidism  Pt has no clinical changes No change in energy level/ hair or skin/ edema and no tremor Lab Results  Component Value Date   TSH 0.92 03/13/2023    Taking levothyroxine 75 mcg daily  If helpful, I am willing to adjust the levothyroxine accordingly        Relevant Medications   levothyroxine (SYNTHROID) 75 MCG tablet     Musculoskeletal and Integument   Osteopenia    Dexa 04/2022  No fractures One fall  Discussed fall prevsntion  D level is in therap range  Encouraged to add more strength building exercise          Other   Routine general medical examination at a health care facility - Primary    Reviewed health habits including diet and exercise and skin cancer prevention Reviewed appropriate screening tests for age  Also reviewed health mt list, fam hx and immunization status , as well as social and family history   See HPI Labs reviewed and ordered Eye exam planned thi smonth  Mammogram utd 02/2023 Neg cologuard 03/2021 Dexa utd 04/2022 , one fall/ no fractures Discussed fall prevention  Discussed strength building exercise  PHQ: 1  Hyperlipidemia    Disc goals for lipids and reasons to control them Rev last labs with pt Rev low sat fat diet in detail  She is statin intolerant but eats heart healthy diet  LDL stable at 99 HDL improved        Relevant Medications   amLODipine (NORVASC) 5 MG tablet   GAD (generalized anxiety disorder)    Uses xanax very infrequently  Saw psychiatry in past       Relevant Medications   ALPRAZolam (XANAX) 0.25 MG tablet   Current use of proton pump inhibitor    Lab Results  Component Value Date   VITAMINB12 535 03/13/2023   Last vitamin D Lab Results  Component Value Date   VD25OH 33.72 03/13/2023          Colon cancer screening    Cologuard utd for another year

## 2023-03-20 NOTE — Assessment & Plan Note (Signed)
Reviewed health habits including diet and exercise and skin cancer prevention Reviewed appropriate screening tests for age  Also reviewed health mt list, fam hx and immunization status , as well as social and family history   See HPI Labs reviewed and ordered Eye exam planned thi smonth  Mammogram utd 02/2023 Neg cologuard 03/2021 Dexa utd 04/2022 , one fall/ no fractures Discussed fall prevention  Discussed strength building exercise  PHQ: 1

## 2023-03-20 NOTE — Assessment & Plan Note (Signed)
Disc goals for lipids and reasons to control them Rev last labs with pt Rev low sat fat diet in detail  She is statin intolerant but eats heart healthy diet  LDL stable at 99 HDL improved

## 2023-03-20 NOTE — Assessment & Plan Note (Signed)
Hypothyroidism  Pt has no clinical changes No change in energy level/ hair or skin/ edema and no tremor Lab Results  Component Value Date   TSH 0.92 03/13/2023    Taking levothyroxine 75 mcg daily  If helpful, I am willing to adjust the levothyroxine accordingly

## 2023-03-20 NOTE — Assessment & Plan Note (Signed)
Lab Results  Component Value Date   VITAMINB12 535 03/13/2023   Last vitamin D Lab Results  Component Value Date   VD25OH 33.72 03/13/2023

## 2023-03-20 NOTE — Assessment & Plan Note (Signed)
Uses xanax very infrequently  Saw psychiatry in past

## 2023-03-20 NOTE — Assessment & Plan Note (Signed)
Dexa 04/2022  No fractures One fall  Discussed fall prevsntion  D level is in therap range  Encouraged to add more strength building exercise

## 2023-03-23 ENCOUNTER — Encounter: Payer: Self-pay | Admitting: Family Medicine

## 2023-03-28 MED ORDER — AMLODIPINE BESYLATE 5 MG PO TABS: ORAL_TABLET | ORAL | 3 refills | Status: DC

## 2023-03-28 NOTE — Addendum Note (Signed)
Addended by: Shon Millet on: 03/28/2023 12:09 PM   Modules accepted: Orders

## 2023-03-31 ENCOUNTER — Encounter: Payer: Self-pay | Admitting: Family Medicine

## 2023-04-02 MED ORDER — METOPROLOL TARTRATE 25 MG PO TABS: 12.5000 mg | ORAL_TABLET | Freq: Two times a day (BID) | ORAL | 1 refills | Status: DC

## 2023-04-24 ENCOUNTER — Encounter: Payer: Self-pay | Admitting: Internal Medicine

## 2023-06-20 ENCOUNTER — Ambulatory Visit (INDEPENDENT_AMBULATORY_CARE_PROVIDER_SITE_OTHER): Payer: Medicare HMO

## 2023-06-20 DIAGNOSIS — Z23 Encounter for immunization: Secondary | ICD-10-CM | POA: Diagnosis not present

## 2023-08-02 ENCOUNTER — Encounter: Payer: Self-pay | Admitting: Family Medicine

## 2023-08-02 NOTE — Telephone Encounter (Signed)
Please schedule visit with me , we will do labs that day

## 2023-08-07 ENCOUNTER — Ambulatory Visit: Payer: Medicare HMO | Admitting: Family Medicine

## 2023-08-08 ENCOUNTER — Encounter: Payer: Self-pay | Admitting: Family Medicine

## 2023-08-08 ENCOUNTER — Ambulatory Visit: Payer: Medicare HMO | Admitting: Family Medicine

## 2023-08-08 VITALS — BP 112/60 | HR 63 | Temp 98.1°F | Ht 64.0 in | Wt 130.4 lb

## 2023-08-08 DIAGNOSIS — E039 Hypothyroidism, unspecified: Secondary | ICD-10-CM

## 2023-08-08 DIAGNOSIS — R5383 Other fatigue: Secondary | ICD-10-CM

## 2023-08-08 DIAGNOSIS — E7800 Pure hypercholesterolemia, unspecified: Secondary | ICD-10-CM

## 2023-08-08 DIAGNOSIS — I10 Essential (primary) hypertension: Secondary | ICD-10-CM

## 2023-08-08 DIAGNOSIS — F411 Generalized anxiety disorder: Secondary | ICD-10-CM | POA: Diagnosis not present

## 2023-08-08 DIAGNOSIS — K219 Gastro-esophageal reflux disease without esophagitis: Secondary | ICD-10-CM

## 2023-08-08 LAB — CBC WITH DIFFERENTIAL/PLATELET
Basophils Absolute: 0.1 10*3/uL (ref 0.0–0.1)
Basophils Relative: 1 % (ref 0.0–3.0)
Eosinophils Absolute: 0.2 10*3/uL (ref 0.0–0.7)
Eosinophils Relative: 3.1 % (ref 0.0–5.0)
HCT: 39.1 % (ref 36.0–46.0)
Hemoglobin: 12.9 g/dL (ref 12.0–15.0)
Lymphocytes Relative: 22.2 % (ref 12.0–46.0)
Lymphs Abs: 1.5 10*3/uL (ref 0.7–4.0)
MCHC: 33.1 g/dL (ref 30.0–36.0)
MCV: 89.5 fL (ref 78.0–100.0)
Monocytes Absolute: 0.7 10*3/uL (ref 0.1–1.0)
Monocytes Relative: 10.7 % (ref 3.0–12.0)
Neutro Abs: 4.2 10*3/uL (ref 1.4–7.7)
Neutrophils Relative %: 63 % (ref 43.0–77.0)
Platelets: 278 10*3/uL (ref 150.0–400.0)
RBC: 4.37 Mil/uL (ref 3.87–5.11)
RDW: 14.1 % (ref 11.5–15.5)
WBC: 6.7 10*3/uL (ref 4.0–10.5)

## 2023-08-08 LAB — BASIC METABOLIC PANEL
BUN: 15 mg/dL (ref 6–23)
CO2: 30 meq/L (ref 19–32)
Calcium: 9.3 mg/dL (ref 8.4–10.5)
Chloride: 98 meq/L (ref 96–112)
Creatinine, Ser: 0.69 mg/dL (ref 0.40–1.20)
GFR: 80.97 mL/min (ref >60.00)
Glucose, Bld: 75 mg/dL (ref 70–99)
Potassium: 4 meq/L (ref 3.5–5.1)
Sodium: 136 meq/L (ref 135–145)

## 2023-08-08 LAB — TSH: TSH: 1.68 u[IU]/mL (ref 0.35–5.50)

## 2023-08-08 NOTE — Assessment & Plan Note (Signed)
BP: 112/60  Overall stable Continues 2.5 mg amlodipine bid  Also metoprolol 12.5 mg bid (prevents headache as well)  Pt had some concerns about low blood pressure when she took higher cbd dose so decreased it Now more fatigued and irritable   Future consideration - may hold amlodipine and see how she does Would like to be able to take more CBD for mood  Will continue to monitor (does not want to make any changes until after holidays)   Lab for thyroid and fatigue as well today

## 2023-08-08 NOTE — Assessment & Plan Note (Addendum)
Worse since cutting cbd Reassuring exam  Mood/ anxiety could add   Labs today incl tsh

## 2023-08-08 NOTE — Patient Instructions (Signed)
Continue current medicines   Labs for thyroid and fatigue today   When holidays are over - can consider holding amlodipine if blood pressure is too low and then go back up on CBD since you felt better on that dose  Blood pressure is ok today    Take care of yourself

## 2023-08-08 NOTE — Progress Notes (Signed)
Subjective:    Patient ID: Sarah Harper, female    DOB: 1941/04/25, 82 y.o.   MRN: [id number]  HPI  Wt Readings from Last 3 Encounters:  08/08/23 130 lb 6 oz (59.1 kg)  03/20/23 127 lb (57.6 kg)  03/15/23 129 lb (58.5 kg)   22.38 kg/m  Vitals:   08/08/23 0914  BP: 112/60  Pulse: 63  Temp: 98.1 F (36.7 C)  SpO2: 98%    Pt presents for c/o fatigue and also HTN  Pt has changed her CBD intake  In August when blood pressure was low she dec CBD dose to 40 mg each am   A little more irritable at the lower dose   More tired Wondered if thyroid was off    bp is stable today  No cp or palpitations or headaches or edema  No side effects to medicines  BP Readings from Last 3 Encounters:  08/08/23 112/60  03/20/23 122/70  03/15/23 122/70    Metoprolol 12.5 mg bid  Amlodipine 5 mg 1/2 pill bid   Pulse Readings from Last 3 Encounters:  08/08/23 63  03/20/23 96  03/15/23 71   Hypothyroid Lab Results  Component Value Date   TSH 1.68 08/08/2023   Levothyroxine 75 mcg daily   Fatigue -worse for a month  Some sleepiness  Weary also / dragging recently   Some stress but not sever     Lab Results  Component Value Date   NA 136 08/08/2023   K 4.0 08/08/2023   CO2 30 08/08/2023   GLUCOSE 75 08/08/2023   BUN 15 08/08/2023   CREATININE 0.69 08/08/2023   CALCIUM 9.3 08/08/2023   GFR 80.97 08/08/2023   EGFR 82 12/08/2020   GFRNONAA >60 01/02/2021    Lab Results  Component Value Date   WBC 6.7 08/08/2023   HGB 12.9 08/08/2023   HCT 39.1 08/08/2023   MCV 89.5 08/08/2023   PLT 278.0 08/08/2023    Lab Results  Component Value Date   VITAMINB12 535 03/13/2023        08/08/2023    9:52 AM 03/20/2023    9:01 AM 12/26/2022    2:05 PM 12/25/2022    1:03 PM 12/21/2021   12:08 PM  Depression screen PHQ 2/9  Decreased Interest 0 0 0 0 0  Down, Depressed, Hopeless 0 0 0 0 0  PHQ - 2 Score 0 0 0 0 0  Altered sleeping 0 0 0    Tired, decreased energy 3 1  2     Change in appetite 0 0 0    Feeling bad or failure about yourself  0 0 0    Trouble concentrating 0 0 0    Moving slowly or fidgety/restless 0 0 0    Suicidal thoughts 0 0 0    PHQ-9 Score 3 1 2     Difficult doing work/chores Not difficult at all Not difficult at all Somewhat difficult        08/08/2023    9:53 AM 12/26/2022    2:05 PM  GAD 7 : Generalized Anxiety Score  Nervous, Anxious, on Edge 1 1  Control/stop worrying 0 0  Worry too much - different things 1 1  Trouble relaxing 0 0  Restless 0 0  Easily annoyed or irritable 1 0  Afraid - awful might happen 0 0  Total GAD 7 Score 3 2  Anxiety Difficulty Not difficult at all Not difficult at all  Patient Active Problem List   Diagnosis Date Noted   Current use of proton pump inhibitor 03/13/2023   Traumatic ecchymosis of right foot 02/13/2023   Pain in joint, lower leg 01/30/2023   Senile purpura (HCC) 12/26/2022   Skin tear of right lower leg without complication 12/26/2022   Elevated alkaline phosphatase level 03/11/2021   Palpitations 11/10/2020   Migraine    Hypertension, essential    H/O: CVA (cerebrovascular accident) 11/08/2020   Headache 09/26/2019   Fall at home 06/02/2019   Colon cancer screening 02/05/2018   Degenerative joint disease (DJD) of lumbar spine 05/02/2017   Internal hemorrhoids 05/02/2017   Generalized osteoarthritis of hand 12/11/2016   GAD (generalized anxiety disorder) 12/11/2016   Estrogen deficiency 05/11/2016   Encounter for screening mammogram for breast cancer 05/11/2016   OSA (obstructive sleep apnea) 03/06/2016   Fatigue 01/25/2016   Left knee pain 10/18/2015   Snoring 10/18/2015   Routine general medical examination at a health care facility 02/28/2015   Encounter for Medicare annual wellness exam 02/06/2013   Hyperlipidemia 08/27/2012   Irregular heart beat 08/05/2012   Encounter for gynecological examination 12/20/2010   Osteopenia 12/17/2009   BACK PAIN, LUMBAR  07/02/2009   IRRITABLE BOWEL SYNDROME 04/02/2009   Asymptomatic postmenopausal status 12/10/2008   Hypothyroidism 12/05/2007   Meniere's disease 12/05/2007   GERD 12/05/2007   Past Medical History:  Diagnosis Date   Anxiety    Cataract 1997   Colon cancer screening 02/05/2018   DDD (degenerative disc disease)    chronic back pain   ETD (eustachian tube dysfunction)    GERD (gastroesophageal reflux disease)    Hypertension 2022   IBS (irritable bowel syndrome)    Meniere's disease    Migraine    still gets visual aura from time to time   Mild cognitive impairment    Osteoporosis    Sleep apnea    CPAP   Stroke (HCC) 2022   Syncopal episodes    after work-up - possible seizures?   Thyroid disease    hypothyroid   TIA (transient ischemic attack) 11/09/2020   Past Surgical History:  Procedure Laterality Date   APPENDECTOMY     CATARACT EXTRACTION W/PHACO Right 05/21/2019   Procedure: CATARACT EXTRACTION PHACO AND INTRAOCULAR LENS PLACEMENT (IOC) RIGHT panoptix lens  00:35.0  21.7%  7.61;  Surgeon: Lockie Mola, MD;  Location: [facility];  Service: Ophthalmology;  Laterality: Right;  sleep apnea requests early   CATARACT EXTRACTION W/PHACO Left 06/11/2019   Procedure: CATARACT EXTRACTION PHACO AND INTRAOCULAR LENS PLACEMENT (IOC) LEFT PANOPTIX TORIC LENS  00:50.0  20.3%  10.21;  Surgeon: Lockie Mola, MD;  Location: [facility];  Service: Ophthalmology;  Laterality: Left;  sleep apnea requests early   COLONOSCOPY     last 2012 - [facility]   ESOPHAGOGASTRODUODENOSCOPY     Multiple, last 01/07/2017 with Savary dilation to 18 mm   EYE SURGERY  August 2022   Cataracs removed   HEMORRHOID BANDING  2018   [facility]   LUMBAR DISC SURGERY  1984   L5    Social History   Tobacco Use   Smoking status: Never    Passive exposure: Never   Smokeless tobacco: Never  Vaping Use   Vaping status: Never Used  Substance Use Topics   Alcohol use: Yes     Comment: Average 1 glass of wine/week   Drug use: Yes    Types: Marijuana    Comment: CBD drops  Family History  Problem Relation Age of Onset   Diabetes Paternal Aunt    Breast cancer Paternal Aunt    Hypertension Maternal Grandmother    Hypertension Maternal Grandfather    Hypertension Sister    Allergies  Allergen Reactions   Aspirin Other (See Comments)    GI intolerance    Nsaids     GI discomfort and gastritis   Tolmetin Other (See Comments)    GI discomfort and gastritis   Current Outpatient Medications on File Prior to Visit  Medication Sig Dispense Refill   ALPRAZolam (XANAX) 0.25 MG tablet Take 1 tablet (0.25 mg total) by mouth daily as needed for anxiety (severe anxiety). 30 tablet 0   AMBULATORY NON FORMULARY MEDICATION Take 40 mg by mouth in the morning and at bedtime.     amLODipine (NORVASC) 5 MG tablet Take 1/2 tablet (2.5 mg) by mouth daily 45 tablet 3   B Complex-C (SUPER B COMPLEX PO) Take 1 tablet by mouth daily.     butalbital-acetaminophen-caffeine (FIORICET) 50-325-40 MG tablet Take 1 tablet by mouth every 6 (six) hours as needed. 60 tablet 0   cholecalciferol (VITAMIN D) 25 MCG (1000 UNIT) tablet Take 1,000 Units by mouth daily.     diclofenac sodium (VOLTAREN) 1 % GEL APPLY 2 GRAMS TOPICALLY FOUR TIMES A DAY TO AFFECTED AREAS 300 g 3   estradiol (ESTRACE) 0.1 MG/GM vaginal cream INSERT SMALL AMOUNT (1 CM) VAGINALLY EVERY OTHER DAY 42.5 g 1   levothyroxine (SYNTHROID) 75 MCG tablet Take 1 tablet (75 mcg total) by mouth daily. 90 tablet 3   linaclotide (LINZESS) 145 MCG CAPS capsule Take 1 capsule (145 mcg total) by mouth as needed. 90 capsule 3   metoprolol tartrate (LOPRESSOR) 25 MG tablet Take 0.5 tablets (12.5 mg total) by mouth 2 (two) times daily. 90 tablet 1   mometasone (NASONEX) 50 MCG/ACT nasal spray Place 2 sprays into the nose daily as needed.     omeprazole (PRILOSEC) 40 MG capsule Take 1 capsule (40 mg total) by mouth daily. 90 capsule 3    sucralfate (CARAFATE) 1 g tablet Take 1 g by mouth 4 (four) times daily as needed.     No current facility-administered medications on file prior to visit.    Review of Systems  Constitutional:  Positive for fatigue. Negative for activity change, appetite change, fever and unexpected weight change.  HENT:  Negative for congestion, ear pain, rhinorrhea, sinus pressure and sore throat.   Eyes:  Negative for pain, redness and visual disturbance.  Respiratory:  Negative for cough, shortness of breath and wheezing.   Cardiovascular:  Negative for chest pain and palpitations.  Gastrointestinal:  Negative for abdominal pain, blood in stool, constipation and diarrhea.  Endocrine: Negative for polydipsia and polyuria.  Genitourinary:  Negative for dysuria, frequency and urgency.  Musculoskeletal:  Negative for arthralgias, back pain and myalgias.  Skin:  Negative for pallor and rash.  Allergic/Immunologic: Negative for environmental allergies.  Neurological:  Negative for dizziness, syncope and headaches.  Hematological:  Negative for adenopathy. Does not bruise/bleed easily.  Psychiatric/Behavioral:  Negative for decreased concentration and dysphoric mood. The patient is nervous/anxious.        Objective:   Physical Exam Constitutional:      General: She is not in acute distress.    Appearance: Normal appearance. She is well-developed and normal weight. She is not ill-appearing or diaphoretic.  HENT:     Head: Normocephalic and atraumatic.  Eyes:  Conjunctiva/sclera: Conjunctivae normal.     Pupils: Pupils are equal, round, and reactive to light.  Neck:     Thyroid: No thyromegaly.     Vascular: No carotid bruit or JVD.  Cardiovascular:     Rate and Rhythm: Normal rate and regular rhythm.     Heart sounds: Normal heart sounds.     No gallop.  Pulmonary:     Effort: Pulmonary effort is normal. No respiratory distress.     Breath sounds: Normal breath sounds. No wheezing or rales.   Abdominal:     General: There is no distension or abdominal bruit.     Palpations: Abdomen is soft.  Musculoskeletal:     Cervical back: Normal range of motion and neck supple.     Right lower leg: No edema.     Left lower leg: No edema.  Lymphadenopathy:     Cervical: No cervical adenopathy.  Skin:    General: Skin is warm and dry.     Coloration: Skin is not pale.     Findings: No rash.  Neurological:     Mental Status: She is alert.     Coordination: Coordination normal.     Gait: Gait normal.     Deep Tendon Reflexes: Reflexes are normal and symmetric. Reflexes normal.     Comments: No significant tremor   Psychiatric:        Mood and Affect: Mood normal.           Assessment & Plan:   Problem List Items Addressed This Visit       Cardiovascular and Mediastinum   Hypertension, essential - Primary   BP: 112/60  Overall stable Continues 2.5 mg amlodipine bid  Also metoprolol 12.5 mg bid (prevents headache as well)  Pt had some concerns about low blood pressure when she took higher cbd dose so decreased it Now more fatigued and irritable   Future consideration - may hold amlodipine and see how she does Would like to be able to take more CBD for mood  Will continue to monitor (does not want to make any changes until after holidays)   Lab for thyroid and fatigue as well today      Relevant Orders   CBC with Differential/Platelet (Completed)   Basic metabolic panel (Completed)     Endocrine   Hypothyroidism   More fatigued lately  On levothyroxine 75 mcg daily and taking appropriately   TSH drawn  No change in exam       Relevant Orders   TSH (Completed)     Other   GAD (generalized anxiety disorder)   Worse since cutting cbd         Fatigue   Worse since cutting cbd Reassuring exam  Mood/ anxiety could add   Labs today incl tsh       Relevant Orders   CBC with Differential/Platelet (Completed)

## 2023-08-08 NOTE — Assessment & Plan Note (Signed)
Worse since cutting cbd

## 2023-08-08 NOTE — Assessment & Plan Note (Signed)
More fatigued lately  On levothyroxine 75 mcg daily and taking appropriately   TSH drawn  No change in exam

## 2023-08-09 ENCOUNTER — Encounter: Payer: Self-pay | Admitting: Family Medicine

## 2023-08-20 ENCOUNTER — Telehealth: Payer: Self-pay

## 2023-08-20 NOTE — Telephone Encounter (Signed)
Per appt notes pt has appt wit Dr Milinda Antis on 08/21/23 at 9:30. Sending note to Dr Milinda Antis who is out of office and [employer].

## 2023-08-20 NOTE — Telephone Encounter (Signed)
I will see her then Agree with ER precautions

## 2023-08-21 ENCOUNTER — Encounter: Payer: Self-pay | Admitting: Family Medicine

## 2023-08-21 ENCOUNTER — Ambulatory Visit (INDEPENDENT_AMBULATORY_CARE_PROVIDER_SITE_OTHER): Payer: Medicare HMO | Admitting: Family Medicine

## 2023-08-21 VITALS — BP 124/72 | HR 61 | Temp 97.7°F | Ht 64.0 in | Wt 133.0 lb

## 2023-08-21 DIAGNOSIS — R300 Dysuria: Secondary | ICD-10-CM

## 2023-08-21 DIAGNOSIS — N3000 Acute cystitis without hematuria: Secondary | ICD-10-CM | POA: Diagnosis not present

## 2023-08-21 LAB — POC URINALSYSI DIPSTICK (AUTOMATED)
Bilirubin, UA: 1
Blood, UA: 80 — AB
Glucose, UA: NEGATIVE
Ketones, UA: NEGATIVE
Nitrite, UA: NEGATIVE
Protein, UA: POSITIVE — AB
Spec Grav, UA: 1.025 (ref 1.010–1.025)
Urobilinogen, UA: 0.2 U/dL
pH, UA: 6 (ref 5.0–8.0)

## 2023-08-21 MED ORDER — CEPHALEXIN 500 MG PO CAPS: 500.0000 mg | ORAL_CAPSULE | Freq: Two times a day (BID) | ORAL | 0 refills | Status: DC

## 2023-08-21 NOTE — Assessment & Plan Note (Signed)
 Voiding symptoms since Friday night - briefly improved with water and cranberry tabs, now worse again Reassuring exam Positive urinalysis for wbc/rbc Sent keflex  to pharmacy  Pending culture  Instructed to update if symptoms worsen pending culture report  Update if not starting to improve in a week or if worsening  Call back and Er precautions noted in detail today   Handout given for uti

## 2023-08-21 NOTE — Patient Instructions (Signed)
 Drink lots of water  Take the generic keflex as directed   If your symptoms worsen please call  If fever or nausea or vomiting - go to the ER   Update if not starting to improve in a week or if worsening

## 2023-08-21 NOTE — Progress Notes (Signed)
 Subjective:    Patient ID: Sarah Harper, female    DOB: 03-Nov-1940, 82 y.o.   MRN: [id number]  HPI  Wt Readings from Last 3 Encounters:  08/21/23 133 lb (60.3 kg)  08/08/23 130 lb 6 oz (59.1 kg)  03/20/23 127 lb (57.6 kg)   22.83 kg/m  Vitals:   08/21/23 0914  BP: 124/72  Pulse: 61  Temp: 97.7 F (36.5 C)  SpO2: 99%   Pt presents with uti symptoms   Started Friday night  Frequent urination  Burning to urinate  Gets tingle/chill at end of urination   Decided to drink lots of water and try not to go to UC  Took some cranberry pills  Got a little better than worse  No blood or fever  No nausea  No flank pain   Bladder is uncomfortable    Urinalysis is positive  Results for orders placed or performed in visit on 08/21/23  POCT Urinalysis Dipstick (Automated)   Collection Time: 08/21/23  9:28 AM  Result Value Ref Range   Color, UA Yellow    Clarity, UA Hazy    Glucose, UA Negative Negative   Bilirubin, UA 1 mg/dL    Ketones, UA Negative    Spec Grav, UA 1.025 1.010 - 1.025   Blood, UA 80 Ery/uL (A)    pH, UA 6.0 5.0 - 8.0   Protein, UA Positive (A) Negative   Urobilinogen, UA 0.2 0.2 or 1.0 E.U./dL   Nitrite, UA Negative    Leukocytes, UA Moderate (2+) (A) Negative       Lab Results  Component Value Date   NA 136 08/08/2023   K 4.0 08/08/2023   CO2 30 08/08/2023   GLUCOSE 75 08/08/2023   BUN 15 08/08/2023   CREATININE 0.69 08/08/2023   CALCIUM  9.3 08/08/2023   GFR 80.97 08/08/2023   EGFR 82 12/08/2020   GFRNONAA >60 01/02/2021     Patient Active Problem List   Diagnosis Date Noted   Acute cystitis 08/21/2023   Current use of proton pump inhibitor 03/13/2023   Traumatic ecchymosis of right foot 02/13/2023   Pain in joint, lower leg 01/30/2023   Senile purpura (HCC) 12/26/2022   Skin tear of right lower leg without complication 12/26/2022   Elevated alkaline phosphatase level 03/11/2021   Palpitations 11/10/2020   Migraine     Hypertension, essential    H/O: CVA (cerebrovascular accident) 11/08/2020   Headache 09/26/2019   Fall at home 06/02/2019   Colon cancer screening 02/05/2018   Degenerative joint disease (DJD) of lumbar spine 05/02/2017   Internal hemorrhoids 05/02/2017   Generalized osteoarthritis of hand 12/11/2016   GAD (generalized anxiety disorder) 12/11/2016   Estrogen deficiency 05/11/2016   Encounter for screening mammogram for breast cancer 05/11/2016   OSA (obstructive sleep apnea) 03/06/2016   Fatigue 01/25/2016   Left knee pain 10/18/2015   Snoring 10/18/2015   Routine general medical examination at a health care facility 02/28/2015   Encounter for Medicare annual wellness exam 02/06/2013   Hyperlipidemia 08/27/2012   Irregular heart beat 08/05/2012   Encounter for gynecological examination 12/20/2010   Osteopenia 12/17/2009   BACK PAIN, LUMBAR 07/02/2009   IRRITABLE BOWEL SYNDROME 04/02/2009   Asymptomatic postmenopausal status 12/10/2008   Hypothyroidism 12/05/2007   Meniere's disease 12/05/2007   GERD 12/05/2007   Past Medical History:  Diagnosis Date   Anxiety    Cataract 1997   Colon cancer screening 02/05/2018   DDD (degenerative disc disease)  chronic back pain   ETD (eustachian tube dysfunction)    GERD (gastroesophageal reflux disease)    Hypertension 2022   IBS (irritable bowel syndrome)    Meniere's disease    Migraine    still gets visual aura from time to time   Mild cognitive impairment    Osteoporosis    Sleep apnea    CPAP   Stroke (HCC) 2022   Syncopal episodes    after work-up - possible seizures?   Thyroid  disease    hypothyroid   TIA (transient ischemic attack) 11/09/2020   Past Surgical History:  Procedure Laterality Date   APPENDECTOMY     CATARACT EXTRACTION W/PHACO Right 05/21/2019   Procedure: CATARACT EXTRACTION PHACO AND INTRAOCULAR LENS PLACEMENT (IOC) RIGHT panoptix lens  00:35.0  21.7%  7.61;  Surgeon: Mittie Gaskin, MD;   Location: [facility];  Service: Ophthalmology;  Laterality: Right;  sleep apnea requests early   CATARACT EXTRACTION W/PHACO Left 06/11/2019   Procedure: CATARACT EXTRACTION PHACO AND INTRAOCULAR LENS PLACEMENT (IOC) LEFT PANOPTIX TORIC LENS  00:50.0  20.3%  10.21;  Surgeon: Mittie Gaskin, MD;  Location: [facility];  Service: Ophthalmology;  Laterality: Left;  sleep apnea requests early   COLONOSCOPY     last 2012 - [facility]   ESOPHAGOGASTRODUODENOSCOPY     Multiple, last 01/07/2017 with Savary dilation to 18 mm   EYE SURGERY  August 2022   Cataracs removed   HEMORRHOID BANDING  2018   [facility]   LUMBAR DISC SURGERY  1984   L5    Social History   Tobacco Use   Smoking status: Never    Passive exposure: Never   Smokeless tobacco: Never  Vaping Use   Vaping status: Never Used  Substance Use Topics   Alcohol use: Yes    Comment: Average 1 glass of wine/week   Drug use: Yes    Types: Marijuana    Comment: CBD drops   Family History  Problem Relation Age of Onset   Diabetes Paternal Aunt    Breast cancer Paternal Aunt    Hypertension Maternal Grandmother    Hypertension Maternal Grandfather    Hypertension Sister    Allergies  Allergen Reactions   Aspirin  Other (See Comments)    GI intolerance    Nsaids     GI discomfort and gastritis   Tolmetin Other (See Comments)    GI discomfort and gastritis   Current Outpatient Medications on File Prior to Visit  Medication Sig Dispense Refill   ALPRAZolam  (XANAX ) 0.25 MG tablet Take 1 tablet (0.25 mg total) by mouth daily as needed for anxiety (severe anxiety). 30 tablet 0   AMBULATORY NON FORMULARY MEDICATION Take 40 mg by mouth in the morning and at bedtime.     amLODipine  (NORVASC ) 5 MG tablet Take 1/2 tablet (2.5 mg) by mouth daily 45 tablet 3   B Complex-C (SUPER B COMPLEX PO) Take 1 tablet by mouth daily.     butalbital -acetaminophen -caffeine  (FIORICET) 50-325-40 MG tablet Take 1 tablet by mouth every  6 (six) hours as needed. 60 tablet 0   cholecalciferol  (VITAMIN D ) 25 MCG (1000 UNIT) tablet Take 1,000 Units by mouth daily.     diclofenac  sodium (VOLTAREN ) 1 % GEL APPLY 2 GRAMS TOPICALLY FOUR TIMES A DAY TO AFFECTED AREAS 300 g 3   estradiol  (ESTRACE ) 0.1 MG/GM vaginal cream INSERT SMALL AMOUNT (1 CM) VAGINALLY EVERY OTHER DAY 42.5 g 1   levothyroxine  (SYNTHROID ) 75 MCG tablet Take 1  tablet (75 mcg total) by mouth daily. 90 tablet 3   linaclotide  (LINZESS ) 145 MCG CAPS capsule Take 1 capsule (145 mcg total) by mouth as needed. 90 capsule 3   metoprolol  tartrate (LOPRESSOR ) 25 MG tablet Take 0.5 tablets (12.5 mg total) by mouth 2 (two) times daily. 90 tablet 1   mometasone (NASONEX) 50 MCG/ACT nasal spray Place 2 sprays into the nose daily as needed.     omeprazole  (PRILOSEC) 40 MG capsule Take 1 capsule (40 mg total) by mouth daily. 90 capsule 3   sucralfate  (CARAFATE ) 1 g tablet Take 1 g by mouth 4 (four) times daily as needed.     No current facility-administered medications on file prior to visit.    Review of Systems  Constitutional:  Positive for fatigue. Negative for activity change, appetite change and fever.  HENT:  Negative for congestion and sore throat.   Eyes:  Negative for itching and visual disturbance.  Respiratory:  Negative for cough and shortness of breath.   Cardiovascular:  Negative for leg swelling.  Gastrointestinal:  Negative for abdominal distention, abdominal pain, constipation, diarrhea and nausea.  Endocrine: Negative for cold intolerance and polydipsia.  Genitourinary:  Positive for dysuria, frequency and urgency. Negative for difficulty urinating, flank pain and hematuria.  Musculoskeletal:  Negative for myalgias.  Skin:  Negative for rash.  Allergic/Immunologic: Negative for immunocompromised state.  Neurological:  Negative for dizziness and weakness.  Hematological:  Negative for adenopathy.       Objective:   Physical Exam Constitutional:       General: She is not in acute distress.    Appearance: Normal appearance. She is well-developed and normal weight. She is not ill-appearing or diaphoretic.  HENT:     Head: Normocephalic and atraumatic.  Eyes:     Conjunctiva/sclera: Conjunctivae normal.     Pupils: Pupils are equal, round, and reactive to light.  Cardiovascular:     Rate and Rhythm: Normal rate and regular rhythm.     Heart sounds: Normal heart sounds.  Pulmonary:     Effort: Pulmonary effort is normal.     Breath sounds: Normal breath sounds.  Abdominal:     General: Bowel sounds are normal. There is no distension.     Palpations: Abdomen is soft.     Tenderness: There is abdominal tenderness. There is no rebound.     Comments: No cva tenderness  Mild suprapubic tenderness (very mild)  No bladder distention noted   Musculoskeletal:     Cervical back: Normal range of motion and neck supple.  Lymphadenopathy:     Cervical: No cervical adenopathy.  Skin:    Findings: No rash.  Neurological:     Mental Status: She is alert.  Psychiatric:        Mood and Affect: Mood normal.           Assessment & Plan:   Problem List Items Addressed This Visit       Genitourinary   Acute cystitis - Primary   Voiding symptoms since Friday night - briefly improved with water and cranberry tabs, now worse again Reassuring exam Positive urinalysis for wbc/rbc Sent keflex  to pharmacy  Pending culture  Instructed to update if symptoms worsen pending culture report  Update if not starting to improve in a week or if worsening  Call back and Er precautions noted in detail today   Handout given for uti       Relevant Orders   Urine Culture  Other Visit Diagnoses       Dysuria       Relevant Orders   POCT Urinalysis Dipstick (Automated) (Completed)

## 2023-08-22 LAB — URINE CULTURE
MICRO NUMBER:: 15905547
Result:: NO GROWTH
SPECIMEN QUALITY:: ADEQUATE

## 2023-08-23 ENCOUNTER — Encounter: Payer: Self-pay | Admitting: Family Medicine

## 2023-08-28 ENCOUNTER — Encounter: Payer: Self-pay | Admitting: Family Medicine

## 2023-08-29 NOTE — Telephone Encounter (Signed)
 Please call her to come in and leave another sample for urinalysis and culture (next week is fine) Drink lots of water Thanks for the update

## 2023-09-02 ENCOUNTER — Telehealth: Payer: Self-pay | Admitting: Family Medicine

## 2023-09-02 DIAGNOSIS — R8290 Unspecified abnormal findings in urine: Secondary | ICD-10-CM | POA: Insufficient documentation

## 2023-09-02 DIAGNOSIS — R300 Dysuria: Secondary | ICD-10-CM

## 2023-09-02 NOTE — Telephone Encounter (Signed)
-----   Message from Alvina Chou sent at 08/31/2023  1:46 PM EST ----- Regarding: Lab orders for Mon, 1.13.25 Lab orders, thanks

## 2023-09-03 ENCOUNTER — Telehealth: Payer: Self-pay | Admitting: Family Medicine

## 2023-09-03 ENCOUNTER — Ambulatory Visit: Payer: Medicare HMO | Admitting: Family Medicine

## 2023-09-03 ENCOUNTER — Telehealth: Payer: Self-pay

## 2023-09-03 ENCOUNTER — Other Ambulatory Visit (INDEPENDENT_AMBULATORY_CARE_PROVIDER_SITE_OTHER): Payer: Medicare HMO

## 2023-09-03 DIAGNOSIS — R350 Frequency of micturition: Secondary | ICD-10-CM

## 2023-09-03 DIAGNOSIS — R300 Dysuria: Secondary | ICD-10-CM

## 2023-09-03 DIAGNOSIS — R8290 Unspecified abnormal findings in urine: Secondary | ICD-10-CM

## 2023-09-03 LAB — POC URINALSYSI DIPSTICK (AUTOMATED)
Bilirubin, UA: NEGATIVE
Blood, UA: 200 — AB
Glucose, UA: NEGATIVE
Ketones, UA: NEGATIVE
Nitrite, UA: NEGATIVE
Protein, UA: POSITIVE — AB
Spec Grav, UA: >=1.03 — AB (ref 1.010–1.025)
Urobilinogen, UA: 0.2 U/dL
pH, UA: 5.5 (ref 5.0–8.0)

## 2023-09-03 MED ORDER — SULFAMETHOXAZOLE-TRIMETHOPRIM 800-160 MG PO TABS: 1.0000 | ORAL_TABLET | Freq: Two times a day (BID) | ORAL | 0 refills | Status: DC

## 2023-09-03 NOTE — Telephone Encounter (Signed)
 Pt notified of UA results and Dr. Royden Purl comments. She will be abx and await for urine cx results. ER precautions given

## 2023-09-03 NOTE — Telephone Encounter (Signed)
 I spoke with pt; pt has restarted with burning upon urination and blood in urine. No fever, no abd pain. Pt said already has lab appt this morning for FU UTI and pt declined appt.to be seen today. Dr Randeen said OK for just labs if pt not having symptoms of fever or pain. Pt will keep lab appt. Sending note to Dr Randeen.

## 2023-09-03 NOTE — Telephone Encounter (Signed)
 Marland Kitchen

## 2023-09-03 NOTE — Telephone Encounter (Signed)
 Pharmacy is updated.

## 2023-09-03 NOTE — Telephone Encounter (Signed)
 Positive urinalysis  Also concentrated (try and drink more water)  I sent bactrim ds to pharmacy   Urine culture pending (thanks) - will update more then   If worse in the meantime or any fever/vomiting or flank pain, go to the ER

## 2023-09-05 ENCOUNTER — Inpatient Hospital Stay (HOSPITAL_COMMUNITY)
Admission: EM | Admit: 2023-09-05 | Discharge: 2023-09-17 | DRG: 480 | Disposition: A | Discharge status: Skilled Nursing Facility | Payer: Medicare HMO | Attending: Internal Medicine | Admitting: Internal Medicine

## 2023-09-05 ENCOUNTER — Other Ambulatory Visit: Payer: Self-pay

## 2023-09-05 ENCOUNTER — Encounter (HOSPITAL_COMMUNITY): Payer: Self-pay

## 2023-09-05 ENCOUNTER — Emergency Department (HOSPITAL_COMMUNITY): Payer: Medicare HMO

## 2023-09-05 ENCOUNTER — Inpatient Hospital Stay (HOSPITAL_COMMUNITY): Payer: Medicare HMO | Admitting: Anesthesiology

## 2023-09-05 DIAGNOSIS — E876 Hypokalemia: Secondary | ICD-10-CM | POA: Diagnosis present

## 2023-09-05 DIAGNOSIS — M8588 Other specified disorders of bone density and structure, other site: Secondary | ICD-10-CM | POA: Diagnosis present

## 2023-09-05 DIAGNOSIS — Z803 Family history of malignant neoplasm of breast: Secondary | ICD-10-CM

## 2023-09-05 DIAGNOSIS — U071 COVID-19: Secondary | ICD-10-CM | POA: Insufficient documentation

## 2023-09-05 DIAGNOSIS — Z8249 Family history of ischemic heart disease and other diseases of the circulatory system: Secondary | ICD-10-CM

## 2023-09-05 DIAGNOSIS — E039 Hypothyroidism, unspecified: Secondary | ICD-10-CM | POA: Diagnosis present

## 2023-09-05 DIAGNOSIS — E222 Syndrome of inappropriate secretion of antidiuretic hormone: Secondary | ICD-10-CM | POA: Diagnosis present

## 2023-09-05 DIAGNOSIS — Z79890 Hormone replacement therapy (postmenopausal): Secondary | ICD-10-CM | POA: Diagnosis not present

## 2023-09-05 DIAGNOSIS — I951 Orthostatic hypotension: Secondary | ICD-10-CM | POA: Diagnosis not present

## 2023-09-05 DIAGNOSIS — S79911A Unspecified injury of right hip, initial encounter: Secondary | ICD-10-CM | POA: Diagnosis present

## 2023-09-05 DIAGNOSIS — E785 Hyperlipidemia, unspecified: Secondary | ICD-10-CM | POA: Diagnosis present

## 2023-09-05 DIAGNOSIS — D62 Acute posthemorrhagic anemia: Secondary | ICD-10-CM | POA: Diagnosis not present

## 2023-09-05 DIAGNOSIS — Z79899 Other long term (current) drug therapy: Secondary | ICD-10-CM | POA: Diagnosis not present

## 2023-09-05 DIAGNOSIS — D500 Iron deficiency anemia secondary to blood loss (chronic): Secondary | ICD-10-CM | POA: Insufficient documentation

## 2023-09-05 DIAGNOSIS — G4733 Obstructive sleep apnea (adult) (pediatric): Secondary | ICD-10-CM | POA: Diagnosis present

## 2023-09-05 DIAGNOSIS — K219 Gastro-esophageal reflux disease without esophagitis: Secondary | ICD-10-CM | POA: Diagnosis present

## 2023-09-05 DIAGNOSIS — Y92009 Unspecified place in unspecified non-institutional (private) residence as the place of occurrence of the external cause: Secondary | ICD-10-CM

## 2023-09-05 DIAGNOSIS — Z751 Person awaiting admission to adequate facility elsewhere: Secondary | ICD-10-CM | POA: Diagnosis not present

## 2023-09-05 DIAGNOSIS — Z886 Allergy status to analgesic agent status: Secondary | ICD-10-CM | POA: Diagnosis not present

## 2023-09-05 DIAGNOSIS — M80051A Age-related osteoporosis with current pathological fracture, right femur, initial encounter for fracture: Secondary | ICD-10-CM | POA: Diagnosis present

## 2023-09-05 DIAGNOSIS — S72002A Fracture of unspecified part of neck of left femur, initial encounter for closed fracture: Principal | ICD-10-CM

## 2023-09-05 DIAGNOSIS — G4700 Insomnia, unspecified: Secondary | ICD-10-CM | POA: Diagnosis present

## 2023-09-05 DIAGNOSIS — Z8673 Personal history of transient ischemic attack (TIA), and cerebral infarction without residual deficits: Secondary | ICD-10-CM | POA: Diagnosis not present

## 2023-09-05 DIAGNOSIS — W010XXA Fall on same level from slipping, tripping and stumbling without subsequent striking against object, initial encounter: Secondary | ICD-10-CM | POA: Diagnosis present

## 2023-09-05 DIAGNOSIS — N390 Urinary tract infection, site not specified: Secondary | ICD-10-CM | POA: Diagnosis present

## 2023-09-05 DIAGNOSIS — W19XXXA Unspecified fall, initial encounter: Secondary | ICD-10-CM

## 2023-09-05 DIAGNOSIS — S72001A Fracture of unspecified part of neck of right femur, initial encounter for closed fracture: Secondary | ICD-10-CM | POA: Diagnosis present

## 2023-09-05 DIAGNOSIS — E871 Hypo-osmolality and hyponatremia: Secondary | ICD-10-CM | POA: Diagnosis not present

## 2023-09-05 DIAGNOSIS — S72141A Displaced intertrochanteric fracture of right femur, initial encounter for closed fracture: Secondary | ICD-10-CM | POA: Diagnosis not present

## 2023-09-05 DIAGNOSIS — I10 Essential (primary) hypertension: Secondary | ICD-10-CM | POA: Diagnosis present

## 2023-09-05 DIAGNOSIS — Z833 Family history of diabetes mellitus: Secondary | ICD-10-CM

## 2023-09-05 DIAGNOSIS — M898X9 Other specified disorders of bone, unspecified site: Secondary | ICD-10-CM | POA: Diagnosis present

## 2023-09-05 DIAGNOSIS — F411 Generalized anxiety disorder: Secondary | ICD-10-CM | POA: Diagnosis present

## 2023-09-05 LAB — CBC
HCT: 37.6 % (ref 36.0–46.0)
Hemoglobin: 12.5 g/dL (ref 12.0–15.0)
MCH: 28.9 pg (ref 26.0–34.0)
MCHC: 33.2 g/dL (ref 30.0–36.0)
MCV: 87 fL (ref 80.0–100.0)
Platelets: 201 10*3/uL (ref 150–400)
RBC: 4.32 MIL/uL (ref 3.87–5.11)
RDW: 13.3 % (ref 11.5–15.5)
WBC: 8.4 10*3/uL (ref 4.0–10.5)
nRBC: 0 % (ref 0.0–0.2)

## 2023-09-05 LAB — VITAMIN D 25 HYDROXY (VIT D DEFICIENCY, FRACTURES): Vit D, 25-Hydroxy: 49.5 ng/mL (ref 30–100)

## 2023-09-05 LAB — CBC WITH DIFFERENTIAL/PLATELET
Abs Immature Granulocytes: 0.06 10*3/uL (ref 0.00–0.07)
Basophils Absolute: 0 10*3/uL (ref 0.0–0.1)
Basophils Relative: 0 %
Eosinophils Absolute: 0 10*3/uL (ref 0.0–0.5)
Eosinophils Relative: 0 %
HCT: 38.4 % (ref 36.0–46.0)
Hemoglobin: 12.9 g/dL (ref 12.0–15.0)
Immature Granulocytes: 1 %
Lymphocytes Relative: 9 %
Lymphs Abs: 0.6 10*3/uL — ABNORMAL LOW (ref 0.7–4.0)
MCH: 29 pg (ref 26.0–34.0)
MCHC: 33.6 g/dL (ref 30.0–36.0)
MCV: 86.3 fL (ref 80.0–100.0)
Monocytes Absolute: 0.6 10*3/uL (ref 0.1–1.0)
Monocytes Relative: 9 %
Neutro Abs: 5.8 10*3/uL (ref 1.7–7.7)
Neutrophils Relative %: 81 %
Platelets: 185 10*3/uL (ref 150–400)
RBC: 4.45 MIL/uL (ref 3.87–5.11)
RDW: 13.2 % (ref 11.5–15.5)
WBC: 7.2 10*3/uL (ref 4.0–10.5)
nRBC: 0 % (ref 0.0–0.2)

## 2023-09-05 LAB — CREATININE, SERUM
Creatinine, Ser: 0.78 mg/dL (ref 0.44–1.00)
GFR, Estimated: >60 mL/min (ref >60)

## 2023-09-05 LAB — TSH: TSH: 1.538 u[IU]/mL (ref 0.350–4.500)

## 2023-09-05 LAB — BASIC METABOLIC PANEL
Anion gap: 12 (ref 5–15)
BUN: 9 mg/dL (ref 8–23)
CO2: 21 mmol/L — ABNORMAL LOW (ref 22–32)
Calcium: 8.8 mg/dL — ABNORMAL LOW (ref 8.9–10.3)
Chloride: 93 mmol/L — ABNORMAL LOW (ref 98–111)
Creatinine, Ser: 0.74 mg/dL (ref 0.44–1.00)
GFR, Estimated: >60 mL/min (ref >60)
Glucose, Bld: 106 mg/dL — ABNORMAL HIGH (ref 70–99)
Potassium: 3.2 mmol/L — ABNORMAL LOW (ref 3.5–5.1)
Sodium: 126 mmol/L — ABNORMAL LOW (ref 135–145)

## 2023-09-05 LAB — URINE CULTURE
MICRO NUMBER:: 15946950
SPECIMEN QUALITY:: ADEQUATE

## 2023-09-05 LAB — SURGICAL PCR SCREEN
MRSA, PCR: NEGATIVE
Staphylococcus aureus: NEGATIVE

## 2023-09-05 LAB — ABO/RH: ABO/RH(D): O POS

## 2023-09-05 LAB — NA AND K (SODIUM & POTASSIUM), RAND UR
Potassium Urine: 16 mmol/L
Sodium, Ur: 80 mmol/L

## 2023-09-05 LAB — TYPE AND SCREEN
ABO/RH(D): O POS
Antibody Screen: NEGATIVE

## 2023-09-05 LAB — OSMOLALITY, URINE: Osmolality, Ur: 319 mosm/kg (ref 300–900)

## 2023-09-05 LAB — OSMOLALITY: Osmolality: 272 mosm/kg — ABNORMAL LOW (ref 275–295)

## 2023-09-05 LAB — CALCIUM: Calcium: 8.9 mg/dL (ref 8.9–10.3)

## 2023-09-05 LAB — CORTISOL: Cortisol, Plasma: 30.8 ug/dL

## 2023-09-05 LAB — ALBUMIN: Albumin: 3.8 g/dL (ref 3.5–5.0)

## 2023-09-05 LAB — MAGNESIUM: Magnesium: 1.7 mg/dL (ref 1.7–2.4)

## 2023-09-05 MED ORDER — DOCUSATE SODIUM 100 MG PO CAPS
100.0000 mg | ORAL_CAPSULE | Freq: Two times a day (BID) | ORAL | Status: DC
  Administered 2023-09-05 – 2023-09-13 (×13): 100 mg via ORAL
  Filled 2023-09-05 (×16): qty 1

## 2023-09-05 MED ORDER — POTASSIUM CHLORIDE CRYS ER 20 MEQ PO TBCR
40.0000 meq | EXTENDED_RELEASE_TABLET | Freq: Once | ORAL | Status: AC
  Administered 2023-09-05: 40 meq via ORAL
  Filled 2023-09-05: qty 2

## 2023-09-05 MED ORDER — METOPROLOL TARTRATE 12.5 MG HALF TABLET
12.5000 mg | ORAL_TABLET | Freq: Two times a day (BID) | ORAL | Status: DC
  Administered 2023-09-05 – 2023-09-17 (×25): 12.5 mg via ORAL
  Filled 2023-09-05 (×26): qty 1

## 2023-09-05 MED ORDER — SODIUM CHLORIDE 0.9 % IV SOLN: INTRAVENOUS | Status: AC

## 2023-09-05 MED ORDER — SULFAMETHOXAZOLE-TRIMETHOPRIM 800-160 MG PO TABS
1.0000 | ORAL_TABLET | Freq: Two times a day (BID) | ORAL | Status: AC
Start: 2023-09-05 — End: 2023-09-05
  Administered 2023-09-05 (×2): 1 via ORAL
  Filled 2023-09-05 (×2): qty 1

## 2023-09-05 MED ORDER — AMLODIPINE BESYLATE 2.5 MG PO TABS
2.5000 mg | ORAL_TABLET | Freq: Every day | ORAL | Status: DC
  Administered 2023-09-05 – 2023-09-17 (×13): 2.5 mg via ORAL
  Filled 2023-09-05 (×13): qty 1

## 2023-09-05 MED ORDER — LEVOTHYROXINE SODIUM 75 MCG PO TABS
75.0000 ug | ORAL_TABLET | Freq: Every day | ORAL | Status: DC
  Administered 2023-09-05 – 2023-09-17 (×12): 75 ug via ORAL
  Filled 2023-09-05 (×12): qty 1

## 2023-09-05 MED ORDER — ENOXAPARIN SODIUM 40 MG/0.4ML IJ SOSY
40.0000 mg | PREFILLED_SYRINGE | INTRAMUSCULAR | Status: DC
  Administered 2023-09-05 – 2023-09-16 (×12): 40 mg via SUBCUTANEOUS
  Filled 2023-09-05 (×12): qty 0.4

## 2023-09-05 MED ORDER — OXYCODONE-ACETAMINOPHEN 5-325 MG PO TABS
1.0000 | ORAL_TABLET | ORAL | Status: DC | PRN
Start: 2023-09-05 — End: 2023-09-11
  Administered 2023-09-05: 1 via ORAL
  Administered 2023-09-05 – 2023-09-10 (×8): 2 via ORAL
  Filled 2023-09-05 (×6): qty 2
  Filled 2023-09-05: qty 1
  Filled 2023-09-05 (×4): qty 2

## 2023-09-05 MED ORDER — MORPHINE SULFATE (PF) 4 MG/ML IV SOLN
4.0000 mg | Freq: Once | INTRAVENOUS | Status: AC
  Administered 2023-09-05: 4 mg via INTRAVENOUS
  Filled 2023-09-05: qty 1

## 2023-09-05 MED ORDER — MORPHINE SULFATE (PF) 2 MG/ML IV SOLN
1.0000 mg | INTRAVENOUS | Status: DC | PRN
  Administered 2023-09-05: 2 mg via INTRAVENOUS
  Filled 2023-09-05: qty 1

## 2023-09-05 MED ORDER — FAMOTIDINE 20 MG PO TABS: 40.0000 mg | ORAL_TABLET | Freq: Every day | ORAL | Status: DC | PRN

## 2023-09-05 MED ORDER — PANTOPRAZOLE SODIUM 40 MG PO TBEC
40.0000 mg | DELAYED_RELEASE_TABLET | Freq: Every day | ORAL | Status: DC
Start: 2023-09-05 — End: 2023-09-17
  Administered 2023-09-05 – 2023-09-17 (×13): 40 mg via ORAL
  Filled 2023-09-05 (×13): qty 1

## 2023-09-05 MED ORDER — SUCRALFATE 1 G PO TABS: 1.0000 g | ORAL_TABLET | Freq: Four times a day (QID) | ORAL | Status: DC | PRN

## 2023-09-05 NOTE — Progress Notes (Signed)
   09/05/23 2307  BiPAP/CPAP/SIPAP  Reason BIPAP/CPAP not in use (S)  Non-compliant (pt states that she does not wish to use a hospital cpap at this time.  Will let staff know if her mind changes)

## 2023-09-05 NOTE — Anesthesia Procedure Notes (Signed)
 Anesthesia Regional Block: Femoral nerve block   Pre-Anesthetic Checklist: , timeout performed,  Correct Patient, Correct Site, Correct Laterality,  Correct Procedure, Correct Position, site marked,  Risks and benefits discussed,  Surgical consent,  Pre-op evaluation,  At surgeon's request and post-op pain management  Laterality: Lower and Right  Prep: chloraprep       Needles:  Injection technique: Single-shot  Needle Type: Stimulator Needle - 80     Needle Length: 9cm  Needle Gauge: 22   Needle insertion depth: 6 cm   Additional Needles:   Procedures:, nerve stimulator,,, ultrasound used (permanent image in chart),,     Nerve Stimulator or Paresthesia:  Response: Patellar snap, 0.5 mA  Additional Responses:   Narrative:  Start time: 09/05/2023 10:24 AM End time: 09/05/2023 10:44 AM Injection made incrementally with aspirations every 5 mL.  Performed by: Personally  Anesthesiologist: Gorman Laughter, MD  Additional Notes: BP cuff, EKG monitors applied. Sedation begun. Femoral artery palpated for location of nerve. After nerve location verified with U/S, anesthetic injected incrementally, slowly, and after negative aspirations under direct u/s guidance. Good perineural spread. Patient tolerated well.

## 2023-09-05 NOTE — Anesthesia Postprocedure Evaluation (Signed)
 Anesthesia Post Note  Patient: Sarah Harper  Procedure(s) Performed: AN AD HOC NERVE BLOCK     Patient location during evaluation: PACU Anesthesia Type: Regional Level of consciousness: sedated and patient cooperative Pain management: pain level controlled Vital Signs Assessment: post-procedure vital signs reviewed and stable Respiratory status: spontaneous breathing Cardiovascular status: stable Anesthetic complications: no   No notable events documented.  Last Vitals:  Vitals:   09/05/23 1013 09/05/23 1041  BP: (!) 171/92 (!) 162/84  Pulse: 83 95  Resp: 14 14  Temp: 36.9 C 36.9 C  SpO2: 95% 93%    Last Pain:  Vitals:   09/05/23 1041  TempSrc:   PainSc: 6                  Gorman Laughter

## 2023-09-05 NOTE — ED Provider Notes (Signed)
 [place] EMERGENCY DEPARTMENT AT [facility] Provider Note   CSN: [id number] Arrival date & time: 09/05/23  5621     History  Chief Complaint  Patient presents with   Fall   Hip Injury    Sarah Harper is a 83 y.o. female.   Fall  This is an 83 year old female history of prior TIA, GERD, hypertension presenting for fall.  Patient states she got up around 5 AM this morning and lost her balance.  She landed on her right hip.  She did not hit her head.  She has pain to her right hip with limited range of motion.  No headache, nausea, vomiting, neck pain, back pain, chest pain or abdominal pain.  No numbness or tingling.  She is not on blood thinners.  Last ate last night.     Home Medications Prior to Admission medications   Medication Sig Start Date End Date Taking? Authorizing Provider  acetaminophen  (TYLENOL ) 500 MG tablet Take 500 mg by mouth every 6 (six) hours as needed for moderate pain (pain score 4-6).   Yes [provider]  ALPRAZolam  (XANAX ) 0.25 MG tablet Take 1 tablet (0.25 mg total) by mouth daily as needed for anxiety (severe anxiety). 03/20/23  Yes Tower, Manley Seeds, MD  amLODipine  (NORVASC ) 5 MG tablet Take 1/2 tablet (2.5 mg) by mouth daily 03/28/23  Yes Tower, Manley Seeds, MD  B Complex-C (SUPER B COMPLEX PO) Take 1 tablet by mouth daily.   Yes [provider]  butalbital -acetaminophen -caffeine  (FIORICET) 50-325-40 MG tablet Take 1 tablet by mouth every 6 (six) hours as needed. 03/20/23  Yes Tower, Manley Seeds, MD  cholecalciferol  (VITAMIN D ) 25 MCG (1000 UNIT) tablet Take 1,000 Units by mouth daily.   Yes [provider]  diclofenac  sodium (VOLTAREN ) 1 % GEL APPLY 2 GRAMS TOPICALLY FOUR TIMES A DAY TO AFFECTED AREAS Patient taking differently: Apply 2 g topically 2 (two) times daily. 05/24/18  Yes Tower, Marne A, MD  estradiol  (ESTRACE ) 0.1 MG/GM vaginal cream INSERT SMALL AMOUNT (1 CM) VAGINALLY EVERY OTHER DAY 07/04/22  Yes Tower, Manley Seeds, MD  famotidine  (PEPCID ) 40 MG tablet Take 40 mg by mouth daily as needed for heartburn or indigestion. 08/20/23  Yes [provider]  levothyroxine  (SYNTHROID ) 75 MCG tablet Take 1 tablet (75 mcg total) by mouth daily. 03/20/23  Yes Tower, Manley Seeds, MD  linaclotide  (LINZESS ) 145 MCG CAPS capsule Take 1 capsule (145 mcg total) by mouth as needed. 03/13/22  Yes Tower, Manley Seeds, MD  metoprolol  tartrate (LOPRESSOR ) 25 MG tablet Take 0.5 tablets (12.5 mg total) by mouth 2 (two) times daily. 04/02/23  Yes Tower, Marne A, MD  mometasone (NASONEX) 50 MCG/ACT nasal spray Place 2 sprays into the nose daily as needed (Rhinitis).   Yes [provider]  omeprazole  (PRILOSEC) 40 MG capsule Take 1 capsule (40 mg total) by mouth daily. 03/20/23  Yes Tower, Manley Seeds, MD  sucralfate  (CARAFATE ) 1 g tablet Take 1 g by mouth 4 (four) times daily as needed (Esophagitis).   Yes [provider]  sulfamethoxazole -trimethoprim  (BACTRIM  DS) 800-160 MG tablet Take 1 tablet by mouth 2 (two) times daily. 09/03/23  Yes Tower, Manley Seeds, MD  cephALEXin  (KEFLEX ) 500 MG capsule Take 1 capsule (500 mg total) by mouth 2 (two) times daily. Patient not taking: Reported on 09/05/2023 08/21/23   Clemens Curt, MD      Allergies    Aspirin , Nsaids, and Tolmetin  Review of Systems   Review of Systems Review of systems completed and notable as per HPI.  ROS otherwise negative.   Physical Exam Updated Vital Signs BP 129/62 (BP Location: Left Arm)   Pulse 82   Temp 98.3 F (36.8 C) (Oral)   Resp 16   SpO2 94%  Physical Exam Vitals and nursing note reviewed.  Constitutional:      General: She is not in acute distress.    Appearance: She is well-developed.  HENT:     Head: Normocephalic and atraumatic.  Eyes:     Conjunctiva/sclera: Conjunctivae normal.  Cardiovascular:     Rate and Rhythm: Normal rate and regular rhythm.     Pulses: Normal pulses.     Heart sounds: Normal heart sounds. No murmur  heard. Pulmonary:     Effort: Pulmonary effort is normal. No respiratory distress.     Breath sounds: Normal breath sounds.  Abdominal:     Palpations: Abdomen is soft.     Tenderness: There is no abdominal tenderness.  Musculoskeletal:        General: No swelling.     Cervical back: Neck supple.     Right lower leg: No edema.     Left lower leg: No edema.     Comments: Right leg is shortened and externally rotated.  Limited range of motion of the right hip with significant pain.  2+ DP and PT pulse.  Normal strength and sensation distally.  No spinal tenderness.  Skin:    General: Skin is warm and dry.     Capillary Refill: Capillary refill takes less than 2 seconds.  Neurological:     Mental Status: She is alert.  Psychiatric:        Mood and Affect: Mood normal.     ED Results / Procedures / Treatments   Labs (all labs ordered are listed, but only abnormal results are displayed) Labs Reviewed  CBC WITH DIFFERENTIAL/PLATELET - Abnormal; Notable for the following components:      Result Value   Lymphs Abs 0.6 (*)    All other components within normal limits  BASIC METABOLIC PANEL - Abnormal; Notable for the following components:   Sodium 126 (*)    Potassium 3.2 (*)    Chloride 93 (*)    CO2 21 (*)    Glucose, Bld 106 (*)    Calcium  8.8 (*)    All other components within normal limits  OSMOLALITY - Abnormal; Notable for the following components:   Osmolality 272 (*)    All other components within normal limits  SURGICAL PCR SCREEN  OSMOLALITY, URINE  NA AND K (SODIUM & POTASSIUM), RAND UR  CBC  CREATININE, SERUM  CALCIUM   VITAMIN D  25 HYDROXY (VIT D DEFICIENCY, FRACTURES)  ALBUMIN  CORTISOL  TSH  MAGNESIUM   ABO/RH  TYPE AND SCREEN    EKG None  Radiology DG FEMUR, MIN 2 VIEWS RIGHT Result Date: 09/05/2023 CLINICAL DATA:  Fall and right hip fracture. EXAM: RIGHT FEMUR 2 VIEWS COMPARISON:  Right hip radiograph dated 12/04/2023. FINDINGS: Comminuted  appearing intertrochanteric fracture of the right femoral neck. The bones are osteopenic. No dislocation. The soft tissues are unremarkable. IMPRESSION: Intertrochanteric fracture of the right femoral neck. Electronically Signed   By: Angus Bark M.D.   On: 09/05/2023 12:29   DG Chest 1 View Result Date: 09/05/2023 CLINICAL DATA:  Right hip fracture EXAM: CHEST  1 VIEW COMPARISON:  11/10/2020 FINDINGS: The heart size and mediastinal contours are  within normal limits. Aortic atherosclerosis. Both lungs are clear. The visualized skeletal structures are unremarkable. IMPRESSION: No active disease. Electronically Signed   By: Leverne Reading D.O.   On: 09/05/2023 12:25   DG Hip Unilat  With Pelvis 2-3 Views Right Result Date: 09/05/2023 CLINICAL DATA:  Fall onto right hip. EXAM: DG HIP (WITH OR WITHOUT PELVIS) 2-3V RIGHT COMPARISON:  None Available. FINDINGS: There is an acute impacted intertrochanteric fracture of the proximal right femur with fracture margins extending through the greater and lesser trochanters. There is approximately 1.8 cm of lateral displacement of the distal segment with apex varus angulation. The right femoral head is seated within the acetabulum. Evaluation of the sacrum is limited due to overlying bowel-gas. The sacroiliac joints and pubic symphysis otherwise appear anatomically aligned. IMPRESSION: Acute impacted, angulated, and displaced intertrochanteric fracture of the proximal right femur. Electronically Signed   By: Mannie Seek M.D.   On: 09/05/2023 08:21    Procedures Procedures    Medications Ordered in ED Medications  sulfamethoxazole -trimethoprim  (BACTRIM  DS) 800-160 MG per tablet 1 tablet (1 tablet Oral Given 09/05/23 1258)  amLODipine  (NORVASC ) tablet 2.5 mg (2.5 mg Oral Given 09/05/23 1256)  metoprolol  tartrate (LOPRESSOR ) tablet 12.5 mg (12.5 mg Oral Given 09/05/23 1254)  levothyroxine  (SYNTHROID ) tablet 75 mcg (75 mcg Oral Given 09/05/23 1253)   famotidine  (PEPCID ) tablet 40 mg (has no administration in time range)  pantoprazole  (PROTONIX ) EC tablet 40 mg (40 mg Oral Given 09/05/23 1256)  sucralfate  (CARAFATE ) tablet 1 g (has no administration in time range)  oxyCODONE -acetaminophen  (PERCOCET/ROXICET) 5-325 MG per tablet 1-2 tablet (1 tablet Oral Given 09/05/23 1253)  morphine  (PF) 2 MG/ML injection 1-4 mg (has no administration in time range)  enoxaparin  (LOVENOX ) injection 40 mg (40 mg Subcutaneous Given 09/05/23 1302)  docusate sodium  (COLACE) capsule 100 mg (100 mg Oral Patient Refused/Not Given 09/05/23 1248)  0.9 %  sodium chloride  infusion (has no administration in time range)  morphine  (PF) 4 MG/ML injection 4 mg (4 mg Intravenous Given 09/05/23 0907)  potassium chloride  SA (KLOR-CON  M) CR tablet 40 mEq (40 mEq Oral Given 09/05/23 1259)    ED Course/ Medical Decision Making/ A&P Clinical Course as of 09/05/23 1621  Wed Sep 05, 2023  1002 Discussed with hospitalist for admission [JD]    Clinical Course User Index [JD] Coleman Daughters, MD                                 Medical Decision Making Amount and/or Complexity of Data Reviewed Labs: ordered. Radiology: ordered.  Risk Prescription drug management. Decision regarding hospitalization.   Medical Decision Making:   Sarah Harper is a 83 y.o. female who presented to the ED today with right hip pain after mechanical fall.  She is hypertensive here.  On exam she has external rotation limited range of motion of the right hip.  I reviewed her x-rays concerning for displaced fracture of the right hip.  No dislocation.  Neurovascularly intact.  No other signs of injury.  Will discuss with orthopedics.   Patient placed on continuous vitals and telemetry monitoring while in ED which was reviewed periodically.  Reviewed and confirmed nursing documentation for past medical history, family history, social history.  Reassessment and Plan:   Discussed with orthopedics.   Discussed with hospitalist and admitted.   Patient's presentation is most consistent with acute complicated illness / injury requiring diagnostic workup.  Final Clinical Impression(s) / ED Diagnoses Final diagnoses:  Closed fracture of left hip, initial encounter [facility])    Rx / DC Orders ED Discharge Orders     None         Coleman Daughters, MD 09/05/23 1621

## 2023-09-05 NOTE — ED Notes (Signed)
Michael PA at bedside

## 2023-09-05 NOTE — ED Notes (Signed)
 Patient to PACU for nerve block

## 2023-09-05 NOTE — ED Notes (Signed)
 Phlebotomy to come draw labs on patient.

## 2023-09-05 NOTE — ED Notes (Signed)
 Hospital bed arrived. Patient transferred with assist x4. Ortho techs at bedside applying traction.

## 2023-09-05 NOTE — ED Triage Notes (Signed)
 Pt arrives via EMS from home. Pt tripped and fell this morning on her right hip. Pt did not hit her head, no blood thinners, no loc and she is AxOx4. Shortening and rotation noted to right leg. Cms is intact.

## 2023-09-05 NOTE — Plan of Care (Signed)
  Problem: Education: Goal: Knowledge of General Education information will improve Description: Including pain rating scale, medication(s)/side effects and non-pharmacologic comfort measures Outcome: Progressing   Problem: Nutrition: Goal: Adequate nutrition will be maintained Outcome: Progressing   Problem: Pain Managment: Goal: General experience of comfort will improve and/or be controlled Outcome: Progressing

## 2023-09-05 NOTE — ED Notes (Signed)
Called for hospital bed again 

## 2023-09-05 NOTE — H&P (Signed)
 History and Physical    Patient: Sarah Harper WGN:[id number] DOB: 25-Jul-1941 DOA: 09/05/2023 DOS: the patient was seen and examined on 09/05/2023 PCP: Clemens Curt, MD  Patient coming from: Home Medical readiness/disposition: Anticipate patient will be ready for discharge by 09/09/2023.  Pending PT and OT evaluation will likely require postdischarge physical therapy and Occupational Therapy.  This may need to be performed at a skilled nursing facility.    Chief Complaint:  Chief Complaint  Patient presents with   Fall   Hip Injury   HPI: Sarah Harper is a 83 y.o. female with medical history significant of hypertension, hypothyroidism, severe GERD, osteopenia, dyslipidemia and sleep apnea.  Recent UTI on Bactrim  with only 2 doses of medication remaining.  Patient was in her usual state of health.  This morning she tripped and fell on her right hip.  No loss of consciousness.  EMS was called to the home where there was noticeable shortening and rotation of right leg.  Imaging in the ED revealed hip fracture.  Patient has been evaluated by the orthopedic team and plans are to take the patient to the operating room on 09/06/2023.  She has undergone a local nerve block preoperatively.  Hospitalist service has been asked to evaluate the patient for admission.  Patient confirms no chest pain, dizziness, shortness of breath or palpitations prior to falling.  She confirms this is a mechanical fall.  She is worried that she needs to receive her home metoprolol  and Synthroid .  These medications have been ordered with request to include now.  Patient's bedside nurse has been updated on need for these medications.  Patient is reporting some improvement in pain after nerve block but still continues to have incomplete pain control.  All other review of systems negative.  Review of Systems: As mentioned in the history of present illness. All other systems reviewed and are negative. Past Medical History:   Diagnosis Date   Anxiety    Cataract 1997   Colon cancer screening 02/05/2018   DDD (degenerative disc disease)    chronic back pain   ETD (eustachian tube dysfunction)    GERD (gastroesophageal reflux disease)    Hypertension 2022   IBS (irritable bowel syndrome)    Meniere's disease    Migraine    still gets visual aura from time to time   Mild cognitive impairment    Osteoporosis    Sleep apnea    CPAP   Stroke (HCC) 2022   Syncopal episodes    after work-up - possible seizures?   Thyroid  disease    hypothyroid   TIA (transient ischemic attack) 11/09/2020   Past Surgical History:  Procedure Laterality Date   APPENDECTOMY     CATARACT EXTRACTION W/PHACO Right 05/21/2019   Procedure: CATARACT EXTRACTION PHACO AND INTRAOCULAR LENS PLACEMENT (IOC) RIGHT panoptix lens  00:35.0  21.7%  7.61;  Surgeon: Annell Kidney, MD;  Location: [facility];  Service: Ophthalmology;  Laterality: Right;  sleep apnea requests early   CATARACT EXTRACTION W/PHACO Left 06/11/2019   Procedure: CATARACT EXTRACTION PHACO AND INTRAOCULAR LENS PLACEMENT (IOC) LEFT PANOPTIX TORIC LENS  00:50.0  20.3%  10.21;  Surgeon: Annell Kidney, MD;  Location: [facility];  Service: Ophthalmology;  Laterality: Left;  sleep apnea requests early   COLONOSCOPY     last 2012 - [facility]   ESOPHAGOGASTRODUODENOSCOPY     Multiple, last 01/07/2017 with Savary dilation to 18 mm   EYE SURGERY  August 2022  Cataracs removed   HEMORRHOID BANDING  2018   [facility]   LUMBAR DISC SURGERY  1984   L5    Social History:  reports that she has never smoked. She has never been exposed to tobacco smoke. She has never used smokeless tobacco. She reports current alcohol use. She reports current drug use. Drug: Marijuana.  Allergies  Allergen Reactions   Aspirin  Other (See Comments)    GI intolerance    Nsaids     GI discomfort and gastritis   Tolmetin Other (See Comments)    GI discomfort and gastritis     Family History  Problem Relation Age of Onset   Diabetes Paternal Aunt    Breast cancer Paternal Aunt    Hypertension Maternal Grandmother    Hypertension Maternal Grandfather    Hypertension Sister     Prior to Admission medications   Medication Sig Start Date End Date Taking? Authorizing Provider  acetaminophen  (TYLENOL ) 500 MG tablet Take 500 mg by mouth every 6 (six) hours as needed for moderate pain (pain score 4-6).   Yes [provider]  ALPRAZolam  (XANAX ) 0.25 MG tablet Take 1 tablet (0.25 mg total) by mouth daily as needed for anxiety (severe anxiety). 03/20/23  Yes Tower, Manley Seeds, MD  amLODipine  (NORVASC ) 5 MG tablet Take 1/2 tablet (2.5 mg) by mouth daily 03/28/23  Yes Tower, Manley Seeds, MD  B Complex-C (SUPER B COMPLEX PO) Take 1 tablet by mouth daily.   Yes [provider]  butalbital -acetaminophen -caffeine  (FIORICET) 50-325-40 MG tablet Take 1 tablet by mouth every 6 (six) hours as needed. 03/20/23  Yes Tower, Manley Seeds, MD  cholecalciferol  (VITAMIN D ) 25 MCG (1000 UNIT) tablet Take 1,000 Units by mouth daily.   Yes [provider]  diclofenac  sodium (VOLTAREN ) 1 % GEL APPLY 2 GRAMS TOPICALLY FOUR TIMES A DAY TO AFFECTED AREAS Patient taking differently: Apply 2 g topically 2 (two) times daily. 05/24/18  Yes Tower, Manley Seeds, MD  estradiol  (ESTRACE ) 0.1 MG/GM vaginal cream INSERT SMALL AMOUNT (1 CM) VAGINALLY EVERY OTHER DAY 07/04/22  Yes Tower, Manley Seeds, MD  famotidine  (PEPCID ) 40 MG tablet Take 40 mg by mouth daily as needed for heartburn or indigestion. 08/20/23  Yes [provider]  levothyroxine  (SYNTHROID ) 75 MCG tablet Take 1 tablet (75 mcg total) by mouth daily. 03/20/23  Yes Tower, Manley Seeds, MD  linaclotide  (LINZESS ) 145 MCG CAPS capsule Take 1 capsule (145 mcg total) by mouth as needed. 03/13/22  Yes Tower, Manley Seeds, MD  metoprolol  tartrate (LOPRESSOR ) 25 MG tablet Take 0.5 tablets (12.5 mg total) by mouth 2 (two) times daily. 04/02/23  Yes  Tower, Marne A, MD  mometasone (NASONEX) 50 MCG/ACT nasal spray Place 2 sprays into the nose daily as needed (Rhinitis).   Yes [provider]  omeprazole  (PRILOSEC) 40 MG capsule Take 1 capsule (40 mg total) by mouth daily. 03/20/23  Yes Tower, Manley Seeds, MD  sucralfate  (CARAFATE ) 1 g tablet Take 1 g by mouth 4 (four) times daily as needed (Esophagitis).   Yes [provider]  sulfamethoxazole -trimethoprim  (BACTRIM  DS) 800-160 MG tablet Take 1 tablet by mouth 2 (two) times daily. 09/03/23  Yes Tower, Manley Seeds, MD  cephALEXin  (KEFLEX ) 500 MG capsule Take 1 capsule (500 mg total) by mouth 2 (two) times daily. Patient not taking: Reported on 09/05/2023 08/21/23   Clemens Curt, MD    Physical Exam: Vitals:   09/05/23 0722 09/05/23 0724 09/05/23 1013 09/05/23 1041  BP:  [name])  166/105 (!) 171/92 (!) 162/84  Pulse:  84 83 95  Resp:  18 14 14   Temp:  99.3 F (37.4 C) 98.5 F (36.9 C) 98.5 F (36.9 C)  TempSrc:  Oral    SpO2: 95% 95% 95% 93%   Constitutional: NAD, calm, comfortable Respiratory: clear to auscultation bilaterally, no wheezing, no crackles. Normal respiratory effort. No accessory muscle use.  Cardiovascular: Regular rate and rhythm, no murmurs / rubs / gallops. No extremity edema. 2+ pedal pulses.  Abdomen: no tenderness, no masses palpated. No hepatosplenomegaly. Bowel sounds positive.  Musculoskeletal: no clubbing / cyanosis. No joint deformity upper extremities.Right lower extremity noted with internal rotation and shortening consistent with known hip fracture  Good ROM, no contractures. Normal muscle tone.  Skin: no rashes, lesions, ulcers. No induration Neurologic: CN 2-12 grossly intact. Sensation intact,  Strength 5/5 x all extremities.  Right lower extremity noted with internal rotation and shortening consistent with known hip fracture Psychiatric: Normal judgment and insight. Alert and oriented x 3. Normal mood.    Data Reviewed:  Sodium 126, potassium 3.2,  CO2 21, glucose 106, BUN 9, creatinine 0.74, GFR greater than 60  WBC 7200 with slight elevation in neutrophils 81%, hemoglobin 12.9, platelets 185,000  Urine osmolality 319, urine potassium 16, urine sodium 80, serum osmolality pending  Assessment and Plan: Mechanical fall with right hip fracture/known osteopenia Appreciate assistance of orthopedic team Operative intervention planned for 1/16 Pain management with oral narcotics as well as IV narcotics for breakthrough pain Check serum calcium , albumin and vitamin D  levels Buck's traction with overhead trapeze bar  Acute hyponatremia Likely secondary to recent use of Bactrim  Differential includes SIADH vs reset Osmo syndrome versus renal salt wasting Urine osmolality 319, urine potassium 16 and urine sodium 80 Patient with known hypothyroidism but will check TSH, will also check serum cortisol random 1200 cc fluid restriction Follow labs  Acute hypokalemia Replace with 1 oral dose and repeat labs in a.m.  Recent UTI Has 2 more doses of Bactrim  pending  Hypertension Continue preadmission Norvasc  and metoprolol   Hypothyroidism Continue preadmission Synthroid   Sleep apnea CPAP at at bedtime  Dyslipidemia Not on medications prior to admission  GERD Continue home medications    Advance Care Planning:   Code Status: Full Code   DVT prophylaxis: Lovenox -dose on 1/15 cleared with orthopedic team  Consults: Orthopedic team  Family Communication: Husband at bedside  Severity of Illness: The appropriate patient status for this patient is INPATIENT. Inpatient status is judged to be reasonable and necessary in order to provide the required intensity of service to ensure the patient's safety. The patient's presenting symptoms, physical exam findings, and initial radiographic and laboratory data in the context of their chronic comorbidities is felt to place them at high risk for further clinical deterioration. Furthermore, it  is not anticipated that the patient will be medically stable for discharge from the hospital within 2 midnights of admission.   * I certify that at the point of admission it is my clinical judgment that the patient will require inpatient hospital care spanning beyond 2 midnights from the point of admission due to high intensity of service, high risk for further deterioration and high frequency of surveillance required.*  Author: Kathye Parkin, NP 09/05/2023 11:56 AM  For on call review [email or web address].

## 2023-09-05 NOTE — Consult Note (Signed)
Reason for Consult:Right hip fx Referring Physician: Fulton Reek Time called: 0818 Time at bedside: 0910   Sarah Harper is an 83 y.o. female.  HPI: Sarah Harper was going to the bathroom when she fell. She's unsure what caused the fall. She had immediate right hip pain and could not get up. She was brought to the ED where x-rays showed a right hip fx and orthopedic surgery was consulted. She lives at home with her husband and doesn't use any assistive devices to ambulate.  Past Medical History:  Diagnosis Date   Anxiety    Cataract 1997   Colon cancer screening 02/05/2018   DDD (degenerative disc disease)    chronic back pain   ETD (eustachian tube dysfunction)    GERD (gastroesophageal reflux disease)    Hypertension 2022   IBS (irritable bowel syndrome)    Meniere's disease    Migraine    still gets visual aura from time to time   Mild cognitive impairment    Osteoporosis    Sleep apnea    CPAP   Stroke (HCC) 2022   Syncopal episodes    after work-up - possible seizures?   Thyroid disease    hypothyroid   TIA (transient ischemic attack) 11/09/2020    Past Surgical History:  Procedure Laterality Date   APPENDECTOMY     CATARACT EXTRACTION W/PHACO Right 05/21/2019   Procedure: CATARACT EXTRACTION PHACO AND INTRAOCULAR LENS PLACEMENT (IOC) RIGHT panoptix lens  00:35.0  21.7%  7.61;  Surgeon: Lockie Mola, MD;  Location: [facility];  Service: Ophthalmology;  Laterality: Right;  sleep apnea requests early   CATARACT EXTRACTION W/PHACO Left 06/11/2019   Procedure: CATARACT EXTRACTION PHACO AND INTRAOCULAR LENS PLACEMENT (IOC) LEFT PANOPTIX TORIC LENS  00:50.0  20.3%  10.21;  Surgeon: Lockie Mola, MD;  Location: [facility];  Service: Ophthalmology;  Laterality: Left;  sleep apnea requests early   COLONOSCOPY     last 2012 - [facility]   ESOPHAGOGASTRODUODENOSCOPY     Multiple, last 01/07/2017 with Savary dilation to 18 mm   EYE SURGERY  August  2022   Cataracs removed   HEMORRHOID BANDING  2018   [facility]   LUMBAR DISC SURGERY  1984   L5     Family History  Problem Relation Age of Onset   Diabetes Paternal Aunt    Breast cancer Paternal Aunt    Hypertension Maternal Grandmother    Hypertension Maternal Grandfather    Hypertension Sister     Social History:  reports that she has never smoked. She has never been exposed to tobacco smoke. She has never used smokeless tobacco. She reports current alcohol use. She reports current drug use. Drug: Marijuana.  Allergies:  Allergies  Allergen Reactions   Aspirin Other (See Comments)    GI intolerance    Nsaids     GI discomfort and gastritis   Tolmetin Other (See Comments)    GI discomfort and gastritis    Medications: I have reviewed the patient's current medications.  Results for orders placed or performed during the hospital encounter of 09/05/23 (from the past 48 hours)  CBC with Differential     Status: Abnormal   Collection Time: 09/05/23  8:05 AM  Result Value Ref Range   WBC 7.2 4.0 - 10.5 K/uL   RBC 4.45 3.87 - 5.11 MIL/uL   Hemoglobin 12.9 12.0 - 15.0 g/dL   HCT 24.4 01.0 - 27.2 %   MCV 86.3 80.0 - 100.0 fL  MCH 29.0 26.0 - 34.0 pg   MCHC 33.6 30.0 - 36.0 g/dL   RDW 40.9 81.1 - 91.4 %   Platelets 185 150 - 400 K/uL   nRBC 0.0 0.0 - 0.2 %   Neutrophils Relative % 81 %   Neutro Abs 5.8 1.7 - 7.7 K/uL   Lymphocytes Relative 9 %   Lymphs Abs 0.6 (L) 0.7 - 4.0 K/uL   Monocytes Relative 9 %   Monocytes Absolute 0.6 0.1 - 1.0 K/uL   Eosinophils Relative 0 %   Eosinophils Absolute 0.0 0.0 - 0.5 K/uL   Basophils Relative 0 %   Basophils Absolute 0.0 0.0 - 0.1 K/uL   Immature Granulocytes 1 %   Abs Immature Granulocytes 0.06 0.00 - 0.07 K/uL    Comment: Performed at [facility], [address], [place], [place] [address]    DG Hip Unilat  With Pelvis 2-3 Views Right Result Date: 09/05/2023 CLINICAL DATA:  Fall onto right hip. EXAM: DG HIP (WITH OR  WITHOUT PELVIS) 2-3V RIGHT COMPARISON:  None Available. FINDINGS: There is an acute impacted intertrochanteric fracture of the proximal right femur with fracture margins extending through the greater and lesser trochanters. There is approximately 1.8 cm of lateral displacement of the distal segment with apex varus angulation. The right femoral head is seated within the acetabulum. Evaluation of the sacrum is limited due to overlying bowel-gas. The sacroiliac joints and pubic symphysis otherwise appear anatomically aligned. IMPRESSION: Acute impacted, angulated, and displaced intertrochanteric fracture of the proximal right femur. Electronically Signed   By: Hart Robinsons M.D.   On: 09/05/2023 08:21    Review of Systems  HENT:  Negative for ear discharge, ear pain, hearing loss and tinnitus.   Eyes:  Negative for photophobia and pain.  Respiratory:  Negative for cough and shortness of breath.   Cardiovascular:  Negative for chest pain.  Gastrointestinal:  Negative for abdominal pain, nausea and vomiting.  Genitourinary:  Negative for dysuria, flank pain, frequency and urgency.  Musculoskeletal:  Positive for arthralgias (Right hip). Negative for back pain, myalgias and neck pain.  Neurological:  Negative for dizziness and headaches.  Hematological:  Does not bruise/bleed easily.  Psychiatric/Behavioral:  The patient is not nervous/anxious.    Blood pressure (!) 166/105, pulse 84, temperature 99.3 F (37.4 C), temperature source Oral, resp. rate 18, SpO2 95%. Physical Exam Constitutional:      General: She is not in acute distress.    Appearance: She is well-developed. She is not diaphoretic.  HENT:     Head: Normocephalic and atraumatic.  Eyes:     General: No scleral icterus.       Right eye: No discharge.        Left eye: No discharge.     Conjunctiva/sclera: Conjunctivae normal.  Cardiovascular:     Rate and Rhythm: Normal rate and regular rhythm.  Pulmonary:     Effort: Pulmonary  effort is normal. No respiratory distress.  Musculoskeletal:     Cervical back: Normal range of motion.     Comments: RLE No traumatic wounds, ecchymosis, or rash  Mod TTP hip  No knee or ankle effusion  Knee stable to varus/ valgus and anterior/posterior stress  Sens DPN, SPN, TN intact  Motor EHL, ext, flex, evers 5/5  DP 2+, PT 2+, No significant edema  Skin:    General: Skin is warm and dry.  Neurological:     Mental Status: She is alert.  Psychiatric:  Mood and Affect: Mood normal.        Behavior: Behavior normal.     Assessment/Plan: Right hip fx -- Plan IMN tomorrow with Dr. Carola Frost. Please keep NPO after MN. Multiple medical problems including HTN, hypothyroidism, anxiety, and GERD -- per primary service    Freeman Caldron, PA-C Orthopedic Surgery [phone] 09/05/2023, 9:19 AM

## 2023-09-05 NOTE — ED Notes (Signed)
 Hospital bed ordered for patient from service response transport at this time.

## 2023-09-05 NOTE — Progress Notes (Signed)
 Orthopedic Tech Progress Note Patient Details:  Sarah Harper November 01, 1940 [id number]  Musculoskeletal Traction Type of Traction: Bucks Skin Traction Traction Location: Right leg Traction Weight: 5 lbs   Post Interventions Patient Tolerated: Well  Mearl Spice Cayley Pester 09/05/2023, 2:25 PM

## 2023-09-05 NOTE — Plan of Care (Signed)

## 2023-09-05 NOTE — Anesthesia Preprocedure Evaluation (Addendum)
 Anesthesia Evaluation  Patient identified by MRN, date of birth, ID band Patient awake    Reviewed: Allergy & Precautions, NPO status , Patient's Chart, lab work & pertinent test results  Airway Mallampati: II  TM Distance: >3 FB Neck ROM: Full    Dental no notable dental hx. (+) Dental Advisory Given, Teeth Intact   Pulmonary sleep apnea    Pulmonary exam normal breath sounds clear to auscultation       Cardiovascular hypertension, Normal cardiovascular exam Rhythm:Regular Rate:Normal  Echo 2022  1. Left ventricular ejection fraction, by estimation, is 55 to 60%. The left ventricle has normal function. The left ventricle has no regional wall motion abnormalities. Left ventricular diastolic parameters were normal.   2. Right ventricular systolic function is normal. The right ventricular size is normal.   3. Left atrial size was mildly dilated.   4. The mitral valve is grossly normal. Mild mitral valve regurgitation.   5. The aortic valve is tricuspid. Aortic valve regurgitation is trivial.      Neuro/Psych  Headaches PSYCHIATRIC DISORDERS Anxiety     TIACVA    GI/Hepatic Neg liver ROS,GERD  ,,  Endo/Other  Hypothyroidism    Renal/GU negative Renal ROS     Musculoskeletal  (+) Arthritis ,    Abdominal   Peds  Hematology negative hematology ROS (+)   Anesthesia Other Findings   Reproductive/Obstetrics                             Anesthesia Physical Anesthesia Plan  ASA: 3  Anesthesia Plan: Regional   Post-op Pain Management:    Induction:   PONV Risk Score and Plan:   Airway Management Planned:   Additional Equipment:   Intra-op Plan:   Post-operative Plan:   Informed Consent: I have reviewed the patients History and Physical, chart, labs and discussed the procedure including the risks, benefits and alternatives for the proposed anesthesia with the patient or authorized  representative who has indicated his/her understanding and acceptance.       Plan Discussed with:   Anesthesia Plan Comments: (Risks of peripheral nerve block explained at length. This includes, but is not limited to, bleeding, infection, reactions to the medications, seizures, damage to surrounding structures, damage to nerves, permanent weakness, numbness, tingling and pain. All patient questions were answered and patient wishes to proceed with nerve block. )        Anesthesia Quick Evaluation

## 2023-09-06 ENCOUNTER — Inpatient Hospital Stay (HOSPITAL_COMMUNITY): Payer: Medicare HMO

## 2023-09-06 ENCOUNTER — Other Ambulatory Visit: Payer: Self-pay

## 2023-09-06 ENCOUNTER — Encounter (HOSPITAL_COMMUNITY)
Admission: EM | Disposition: A | Discharge status: Skilled Nursing Facility | Payer: Self-pay | Source: Home / Self Care | Attending: Internal Medicine

## 2023-09-06 ENCOUNTER — Inpatient Hospital Stay (HOSPITAL_COMMUNITY): Payer: Self-pay | Admitting: Anesthesiology

## 2023-09-06 ENCOUNTER — Encounter (HOSPITAL_COMMUNITY): Payer: Self-pay | Admitting: Internal Medicine

## 2023-09-06 DIAGNOSIS — E039 Hypothyroidism, unspecified: Secondary | ICD-10-CM | POA: Diagnosis not present

## 2023-09-06 DIAGNOSIS — S72141A Displaced intertrochanteric fracture of right femur, initial encounter for closed fracture: Secondary | ICD-10-CM

## 2023-09-06 DIAGNOSIS — I10 Essential (primary) hypertension: Secondary | ICD-10-CM

## 2023-09-06 DIAGNOSIS — E222 Syndrome of inappropriate secretion of antidiuretic hormone: Secondary | ICD-10-CM | POA: Diagnosis not present

## 2023-09-06 DIAGNOSIS — G4733 Obstructive sleep apnea (adult) (pediatric): Secondary | ICD-10-CM | POA: Diagnosis not present

## 2023-09-06 DIAGNOSIS — S72001A Fracture of unspecified part of neck of right femur, initial encounter for closed fracture: Secondary | ICD-10-CM | POA: Diagnosis not present

## 2023-09-06 HISTORY — PX: INTRAMEDULLARY (IM) NAIL INTERTROCHANTERIC: SHX5875

## 2023-09-06 LAB — BASIC METABOLIC PANEL
Anion gap: 9 (ref 5–15)
BUN: 13 mg/dL (ref 8–23)
CO2: 23 mmol/L (ref 22–32)
Calcium: 9 mg/dL (ref 8.9–10.3)
Chloride: 94 mmol/L — ABNORMAL LOW (ref 98–111)
Creatinine, Ser: 0.77 mg/dL (ref 0.44–1.00)
GFR, Estimated: >60 mL/min (ref >60)
Glucose, Bld: 123 mg/dL — ABNORMAL HIGH (ref 70–99)
Potassium: 4.1 mmol/L (ref 3.5–5.1)
Sodium: 126 mmol/L — ABNORMAL LOW (ref 135–145)

## 2023-09-06 LAB — CBC
HCT: 36.9 % (ref 36.0–46.0)
Hemoglobin: 12.6 g/dL (ref 12.0–15.0)
MCH: 29.2 pg (ref 26.0–34.0)
MCHC: 34.1 g/dL (ref 30.0–36.0)
MCV: 85.6 fL (ref 80.0–100.0)
Platelets: 188 10*3/uL (ref 150–400)
RBC: 4.31 MIL/uL (ref 3.87–5.11)
RDW: 13.2 % (ref 11.5–15.5)
WBC: 5.6 10*3/uL (ref 4.0–10.5)
nRBC: 0 % (ref 0.0–0.2)

## 2023-09-06 SURGERY — FIXATION, FRACTURE, INTERTROCHANTERIC, WITH INTRAMEDULLARY ROD
Anesthesia: General | Laterality: Right

## 2023-09-06 MED ORDER — AMISULPRIDE (ANTIEMETIC) 5 MG/2ML IV SOLN: 10.0000 mg | Freq: Once | INTRAVENOUS | Status: DC

## 2023-09-06 MED ORDER — ROCURONIUM BROMIDE 10 MG/ML (PF) SYRINGE
PREFILLED_SYRINGE | INTRAVENOUS | Status: DC | PRN
  Administered 2023-09-06: 50 mg via INTRAVENOUS

## 2023-09-06 MED ORDER — DOCUSATE SODIUM 100 MG PO CAPS: 100.0000 mg | ORAL_CAPSULE | Freq: Two times a day (BID) | ORAL | Status: DC

## 2023-09-06 MED ORDER — ROCURONIUM BROMIDE 10 MG/ML (PF) SYRINGE
PREFILLED_SYRINGE | INTRAVENOUS | Status: AC
  Filled 2023-09-06: qty 10

## 2023-09-06 MED ORDER — PROPOFOL 10 MG/ML IV BOLUS
INTRAVENOUS | Status: DC | PRN
  Administered 2023-09-06: 80 mg via INTRAVENOUS

## 2023-09-06 MED ORDER — CHLORHEXIDINE GLUCONATE 0.12 % MT SOLN
15.0000 mL | Freq: Once | OROMUCOSAL | Status: AC
  Administered 2023-09-06: 15 mL via OROMUCOSAL
  Filled 2023-09-06: qty 15

## 2023-09-06 MED ORDER — PHENOL 1.4 % MT LIQD
1.0000 | OROMUCOSAL | Status: DC | PRN
Start: 2023-09-06 — End: 2023-09-17
  Administered 2023-09-12: 1 via OROMUCOSAL
  Filled 2023-09-06: qty 177

## 2023-09-06 MED ORDER — ONDANSETRON HCL 4 MG PO TABS
4.0000 mg | ORAL_TABLET | Freq: Four times a day (QID) | ORAL | Status: DC | PRN
  Administered 2023-09-16: 4 mg via ORAL
  Filled 2023-09-06: qty 1

## 2023-09-06 MED ORDER — MORPHINE SULFATE (PF) 2 MG/ML IV SOLN
1.0000 mg | INTRAVENOUS | Status: DC | PRN
  Administered 2023-09-06 – 2023-09-07 (×2): 2 mg via INTRAVENOUS
  Filled 2023-09-06 (×3): qty 1

## 2023-09-06 MED ORDER — LIDOCAINE 2% (20 MG/ML) 5 ML SYRINGE
INTRAMUSCULAR | Status: DC | PRN
  Administered 2023-09-06: 40 mg via INTRAVENOUS

## 2023-09-06 MED ORDER — ORAL CARE MOUTH RINSE: 15.0000 mL | Freq: Once | OROMUCOSAL | Status: AC

## 2023-09-06 MED ORDER — LACTATED RINGERS IV SOLN: INTRAVENOUS | Status: DC

## 2023-09-06 MED ORDER — POVIDONE-IODINE 10 % EX SWAB
2.0000 | Freq: Once | CUTANEOUS | Status: AC
  Administered 2023-09-06: 2 via TOPICAL

## 2023-09-06 MED ORDER — ONDANSETRON HCL 4 MG/2ML IJ SOLN
INTRAMUSCULAR | Status: DC | PRN
  Administered 2023-09-06: 4 mg via INTRAVENOUS

## 2023-09-06 MED ORDER — AMISULPRIDE (ANTIEMETIC) 5 MG/2ML IV SOLN
INTRAVENOUS | Status: AC
  Filled 2023-09-06: qty 4

## 2023-09-06 MED ORDER — METHOCARBAMOL 500 MG PO TABS
500.0000 mg | ORAL_TABLET | Freq: Four times a day (QID) | ORAL | Status: DC | PRN
  Administered 2023-09-08 – 2023-09-10 (×5): 500 mg via ORAL
  Filled 2023-09-06 (×6): qty 1

## 2023-09-06 MED ORDER — ENSURE MAX PROTEIN PO LIQD
11.0000 [oz_av] | Freq: Two times a day (BID) | ORAL | Status: DC
Start: 2023-09-06 — End: 2023-09-14
  Administered 2023-09-07 – 2023-09-12 (×7): 11 [oz_av] via ORAL

## 2023-09-06 MED ORDER — LIDOCAINE 2% (20 MG/ML) 5 ML SYRINGE
INTRAMUSCULAR | Status: AC
  Filled 2023-09-06: qty 5

## 2023-09-06 MED ORDER — TRANEXAMIC ACID-NACL 1000-0.7 MG/100ML-% IV SOLN
1000.0000 mg | Freq: Once | INTRAVENOUS | Status: AC
  Administered 2023-09-06: 1000 mg via INTRAVENOUS
  Filled 2023-09-06: qty 100

## 2023-09-06 MED ORDER — SUGAMMADEX SODIUM 200 MG/2ML IV SOLN
INTRAVENOUS | Status: DC | PRN
  Administered 2023-09-06: 120 mg via INTRAVENOUS

## 2023-09-06 MED ORDER — FENTANYL CITRATE (PF) 250 MCG/5ML IJ SOLN
INTRAMUSCULAR | Status: DC | PRN
  Administered 2023-09-06 (×3): 50 ug via INTRAVENOUS
  Administered 2023-09-06: 75 ug via INTRAVENOUS
  Administered 2023-09-06: 25 ug via INTRAVENOUS

## 2023-09-06 MED ORDER — FENTANYL CITRATE (PF) 100 MCG/2ML IJ SOLN: 25.0000 ug | INTRAMUSCULAR | Status: DC | PRN

## 2023-09-06 MED ORDER — FENTANYL CITRATE (PF) 250 MCG/5ML IJ SOLN
INTRAMUSCULAR | Status: AC
  Filled 2023-09-06: qty 5

## 2023-09-06 MED ORDER — METOCLOPRAMIDE HCL 5 MG/ML IJ SOLN: 5.0000 mg | Freq: Three times a day (TID) | INTRAMUSCULAR | Status: DC | PRN

## 2023-09-06 MED ORDER — METOCLOPRAMIDE HCL 5 MG PO TABS: 5.0000 mg | ORAL_TABLET | Freq: Three times a day (TID) | ORAL | Status: DC | PRN

## 2023-09-06 MED ORDER — MENTHOL 3 MG MT LOZG
1.0000 | LOZENGE | OROMUCOSAL | Status: DC | PRN
Start: 2023-09-06 — End: 2023-09-17
  Administered 2023-09-06: 3 mg via ORAL
  Filled 2023-09-06: qty 9

## 2023-09-06 MED ORDER — PHENYLEPHRINE 80 MCG/ML (10ML) SYRINGE FOR IV PUSH (FOR BLOOD PRESSURE SUPPORT)
PREFILLED_SYRINGE | INTRAVENOUS | Status: DC | PRN
  Administered 2023-09-06 (×2): 80 ug via INTRAVENOUS

## 2023-09-06 MED ORDER — PROPOFOL 10 MG/ML IV BOLUS
INTRAVENOUS | Status: AC
  Filled 2023-09-06: qty 20

## 2023-09-06 MED ORDER — CEFAZOLIN SODIUM-DEXTROSE 2-4 GM/100ML-% IV SOLN
2.0000 g | INTRAVENOUS | Status: AC
  Administered 2023-09-06: 2 g via INTRAVENOUS
  Filled 2023-09-06: qty 100

## 2023-09-06 MED ORDER — ONDANSETRON HCL 4 MG/2ML IJ SOLN
INTRAMUSCULAR | Status: AC
  Filled 2023-09-06: qty 2

## 2023-09-06 MED ORDER — 0.9 % SODIUM CHLORIDE (POUR BTL) OPTIME
TOPICAL | Status: DC | PRN
  Administered 2023-09-06: 1000 mL

## 2023-09-06 MED ORDER — ONDANSETRON HCL 4 MG/2ML IJ SOLN
4.0000 mg | Freq: Four times a day (QID) | INTRAMUSCULAR | Status: DC | PRN
  Administered 2023-09-06 – 2023-09-12 (×6): 4 mg via INTRAVENOUS
  Filled 2023-09-06 (×6): qty 2

## 2023-09-06 MED ORDER — CHLORHEXIDINE GLUCONATE 4 % EX SOLN
60.0000 mL | Freq: Once | CUTANEOUS | Status: AC
  Administered 2023-09-06: 4 via TOPICAL

## 2023-09-06 MED ORDER — CEFAZOLIN SODIUM-DEXTROSE 2-4 GM/100ML-% IV SOLN
2.0000 g | Freq: Four times a day (QID) | INTRAVENOUS | Status: AC
  Administered 2023-09-06 (×2): 2 g via INTRAVENOUS
  Filled 2023-09-06 (×2): qty 100

## 2023-09-06 SURGICAL SUPPLY — 48 items
BAG COUNTER SPONGE SURGICOUNT (BAG) ×2 IMPLANT
BIT DRILL CANN LG 4.3MM (BIT) IMPLANT
BNDG COHESIVE 6X5 TAN ST LF (GAUZE/BANDAGES/DRESSINGS) IMPLANT
BRUSH SCRUB EZ PLAIN DRY (MISCELLANEOUS) ×4 IMPLANT
COVER PERINEAL POST (MISCELLANEOUS) ×2 IMPLANT
COVER SURGICAL LIGHT HANDLE (MISCELLANEOUS) ×4 IMPLANT
DRAPE C-ARM 42X72 X-RAY (DRAPES) ×2 IMPLANT
DRAPE C-ARMOR (DRAPES) ×2 IMPLANT
DRAPE HALF SHEET 40X57 (DRAPES) IMPLANT
DRAPE INCISE IOBAN 66X45 STRL (DRAPES) ×2 IMPLANT
DRAPE SURG ORHT 6 SPLT 77X108 (DRAPES) IMPLANT
DRAPE U-SHAPE 47X51 STRL (DRAPES) ×2 IMPLANT
DRESSING MEPILEX FLEX 4X4 (GAUZE/BANDAGES/DRESSINGS) ×2 IMPLANT
DRILL BIT CANN LG 4.3MM (BIT) ×1
DRSG EMULSION OIL 3X3 NADH (GAUZE/BANDAGES/DRESSINGS) ×2 IMPLANT
DRSG MEPILEX FLEX 4X4 (GAUZE/BANDAGES/DRESSINGS) ×1
DRSG MEPILEX POST OP 4X8 (GAUZE/BANDAGES/DRESSINGS) ×2 IMPLANT
DRSG MEPITEL 3X4 ME34 (GAUZE/BANDAGES/DRESSINGS) IMPLANT
ELECT REM PT RETURN 9FT ADLT (ELECTROSURGICAL) ×1
ELECTRODE REM PT RTRN 9FT ADLT (ELECTROSURGICAL) ×2 IMPLANT
GLOVE BIO SURGEON STRL SZ7.5 (GLOVE) ×2 IMPLANT
GLOVE BIO SURGEON STRL SZ8 (GLOVE) ×2 IMPLANT
GLOVE BIOGEL PI IND STRL 7.5 (GLOVE) ×2 IMPLANT
GLOVE BIOGEL PI IND STRL 8 (GLOVE) ×2 IMPLANT
GLOVE SURG ORTHO LTX SZ7.5 (GLOVE) ×4 IMPLANT
GOWN STRL REUS W/ TWL LRG LVL3 (GOWN DISPOSABLE) ×4 IMPLANT
GOWN STRL REUS W/ TWL XL LVL3 (GOWN DISPOSABLE) ×2 IMPLANT
GUIDEPIN VERSANAIL DSP 3.2X444 (ORTHOPEDIC DISPOSABLE SUPPLIES) IMPLANT
GUIDEWIRE BALL NOSE 80CM (WIRE) IMPLANT
HIP FRA NAIL LAG SCREW 10.5X90 (Orthopedic Implant) ×1 IMPLANT
KIT BASIN OR (CUSTOM PROCEDURE TRAY) ×2 IMPLANT
KIT TURNOVER KIT B (KITS) ×2 IMPLANT
MANIFOLD NEPTUNE II (INSTRUMENTS) ×2 IMPLANT
NAIL HIP FRACT 130D 9X180 (Orthopedic Implant) IMPLANT
NS IRRIG 1000ML POUR BTL (IV SOLUTION) ×2 IMPLANT
PACK GENERAL/GYN (CUSTOM PROCEDURE TRAY) ×2 IMPLANT
PAD ARMBOARD 7.5X6 YLW CONV (MISCELLANEOUS) ×4 IMPLANT
SCREW BONE CORTICAL 5.0X32 (Screw) IMPLANT
SCREW LAG HIP FRA NAIL 10.5X90 (Orthopedic Implant) IMPLANT
STAPLER VISISTAT 35W (STAPLE) ×2 IMPLANT
STOCKINETTE IMPERVIOUS LG (DRAPES) IMPLANT
SUT ETHILON 2 0 PSLX (SUTURE) ×2 IMPLANT
SUT VIC AB 0 CT1 27XBRD ANBCTR (SUTURE) ×2 IMPLANT
SUT VIC AB 1 CT1 27XBRD ANBCTR (SUTURE) ×2 IMPLANT
SUT VIC AB 2-0 CT1 TAPERPNT 27 (SUTURE) ×2 IMPLANT
TOWEL GREEN STERILE (TOWEL DISPOSABLE) ×4 IMPLANT
TOWEL GREEN STERILE FF (TOWEL DISPOSABLE) ×2 IMPLANT
WATER STERILE IRR 1000ML POUR (IV SOLUTION) ×2 IMPLANT

## 2023-09-06 NOTE — Transfer of Care (Signed)
Immediate Anesthesia Transfer of Care Note  Patient: Quanisha L Dornbush  Procedure(s) Performed: INTRAMEDULLARY (IM) NAIL INTERTROCHANTERIC (Right)  Patient Location: PACU  Anesthesia Type:General  Level of Consciousness: awake, alert , and oriented  Airway & Oxygen Therapy: Patient Spontanous Breathing and Patient connected to face mask oxygen  Post-op Assessment: Report given to RN and Post -op Vital signs reviewed and stable  Post vital signs: Reviewed and stable  Last Vitals:  Vitals Value Taken Time  BP 182/86 09/06/23 1004  Temp 36.6 C 09/06/23 1004  Pulse 66 09/06/23 1010  Resp 13 09/06/23 1010  SpO2 84 % 09/06/23 1010  Vitals shown include unfiled device data.  Last Pain:  Vitals:   09/06/23 0735  TempSrc:   PainSc: 6       Patients Stated Pain Goal: 0 (09/06/23 0438)  Complications: No notable events documented.

## 2023-09-06 NOTE — Anesthesia Preprocedure Evaluation (Addendum)
Anesthesia Evaluation  Patient identified by MRN, date of birth, ID band Patient awake    Reviewed: Allergy & Precautions, NPO status , Patient's Chart, lab work & pertinent test results  Airway Mallampati: II  TM Distance: >3 FB Neck ROM: Full    Dental  (+) Teeth Intact, Loose   Pulmonary sleep apnea    breath sounds clear to auscultation       Cardiovascular hypertension, Pt. on medications and Pt. on home beta blockers + Peripheral Vascular Disease   Rhythm:Regular Rate:Normal  Echo (2022):   1. Left ventricular ejection fraction, by estimation, is 55 to 60%. The  left ventricle has normal function. The left ventricle has no regional  wall motion abnormalities. Left ventricular diastolic parameters were  normal.   2. Right ventricular systolic function is normal. The right ventricular  size is normal.   3. Left atrial size was mildly dilated.   4. The mitral valve is grossly normal. Mild mitral valve regurgitation.   5. The aortic valve is tricuspid. Aortic valve regurgitation is trivial.     Neuro/Psych  Headaches PSYCHIATRIC DISORDERS Anxiety     TIACVA    GI/Hepatic Neg liver ROS,GERD  Medicated,,  Endo/Other  Hypothyroidism    Renal/GU negative Renal ROS     Musculoskeletal  (+) Arthritis ,    Abdominal   Peds  Hematology negative hematology ROS (+)   Anesthesia Other Findings   Reproductive/Obstetrics                             Anesthesia Physical Anesthesia Plan  ASA: 3  Anesthesia Plan: General   Post-op Pain Management: Tylenol PO (pre-op)*   Induction: Intravenous  PONV Risk Score and Plan: 4 or greater and Ondansetron and Treatment may vary due to age or medical condition  Airway Management Planned: Oral ETT  Additional Equipment: None  Intra-op Plan:   Post-operative Plan: Extubation in OR  Informed Consent: I have reviewed the patients History and  Physical, chart, labs and discussed the procedure including the risks, benefits and alternatives for the proposed anesthesia with the patient or authorized representative who has indicated his/her understanding and acceptance.       Plan Discussed with: CRNA  Anesthesia Plan Comments:        Anesthesia Quick Evaluation

## 2023-09-06 NOTE — Progress Notes (Signed)
Initial Nutrition Assessment  DOCUMENTATION CODES:   Not applicable  INTERVENTION:   -Continue regular diet with 1200 mL fluid restriction. Please encourage intakes.  -Provide Ensure Max po BID, each supplement provides 150 kcal and 30 grams of protein.      NUTRITION DIAGNOSIS:   Increased nutrient needs related to hip fracture, post-op healing as evidenced by estimated needs.    GOAL:   Patient will meet greater than or equal to 90% of their needs    MONITOR:   PO intake, Labs, Supplement acceptance, Weight trends, Skin  REASON FOR ASSESSMENT:   Consult Assessment of nutrition requirement/status  ASSESSMENT: 83 y/o female presented to the ED after a mechanical fall at home, sustaining injury to her right hip with acute pain. Admitted with closed right hip fracture.  PMH: HTN, hypothyroidism, GERD, osteopenia, dyslipidemia, OSA, recent UTI, IBS, Meniere's disease, mild cognitive impairement, osteoporosis, stroke, syncopal events, TIA, current marijuana use.  01/16-IM of right hip fracture 01/16-SLP swallow evaluation recommended regular diet with thin liquids  Hyponatremia thought to be secondary to SIADH vs medication per physician note, 1200 mL fluid restriction.  Patient notes she is feeling overwhelmed with all of the staff who have visited her today. She is willing to provide a nutrition hx. States at home she consumes three meals daily, B: biscuit or pancakes with eggs, or oatmeal, L: pasta salad with a lot of vegetables or fast food or barbeque, D: baked chicken, or shrimp with vegetables. Endorses a fine appetite at home. In the hospital she is not eating as well due to current illness. Denies difficulty chewing or swallowing. Discussed nutrition recommendations to support healing. Patient is amenable to Ensure Max. Per review of EMR, stable weights past 26 months.  Patient stated usual weight 128-130#.   Medications reviewed and include colace, synthroid,  PPI, PRN zofran.  Labs: sodium 126  Diet Order:   Diet Order             Diet regular Room service appropriate? Yes; Fluid consistency: Thin; Fluid restriction: 1200 mL Fluid  Diet effective now                   EDUCATION NEEDS:   Education needs have been addressed  Skin:  Skin Assessment: Skin Integrity Issues: Skin Integrity Issues:: Incisions Incisions: R hip  Last BM:  09/05/2023  Height:   Ht Readings from Last 1 Encounters:  09/06/23 5\' 4"  (1.626 m)    Weight:   Wt Readings from Last 1 Encounters:  09/06/23 59 kg    Ideal Body Weight:  56.8 kg  BMI:  Body mass index is 22.31 kg/m.  Estimated Nutritional Needs:   Kcal:  1600-1800 kcal/day  Protein:  74-85 gm/day  Fluid:  1600-1800 mL/day    Alvino Chapel, RDLD Clinical Dietitian If unable to reach, please contact "[facility] Inpatient" secure chat group between 8 am-4 pm daily"

## 2023-09-06 NOTE — Anesthesia Postprocedure Evaluation (Signed)
Anesthesia Post Note  Patient: Sarah Harper  Procedure(s) Performed: INTRAMEDULLARY (IM) NAIL INTERTROCHANTERIC (Right)     Patient location during evaluation: PACU Anesthesia Type: General Level of consciousness: awake and alert Pain management: pain level controlled Vital Signs Assessment: post-procedure vital signs reviewed and stable Respiratory status: spontaneous breathing, nonlabored ventilation, respiratory function stable and patient connected to nasal cannula oxygen Cardiovascular status: blood pressure returned to baseline and stable Postop Assessment: no apparent nausea or vomiting Anesthetic complications: no  No notable events documented.  Last Vitals:  Vitals:   09/06/23 1015 09/06/23 1030  BP: (!) 179/88 (!) 183/86  Pulse: (!) 56 (!) 58  Resp: 11 12  Temp:  36.6 C  SpO2: 96% 96%    Last Pain:  Vitals:   09/06/23 1030  TempSrc:   PainSc: 0-No pain                 Shelton Silvas

## 2023-09-06 NOTE — Plan of Care (Signed)
  Problem: Clinical Measurements: Goal: Ability to maintain clinical measurements within normal limits will improve Outcome: Progressing Goal: Will remain free from infection Outcome: Progressing   Problem: Activity: Goal: Risk for activity intolerance will decrease Outcome: Progressing   Problem: Nutrition: Goal: Adequate nutrition will be maintained Outcome: Progressing   Problem: Pain Managment: Goal: General experience of comfort will improve and/or be controlled Outcome: Progressing   Problem: Safety: Goal: Ability to remain free from injury will improve Outcome: Progressing   Problem: Skin Integrity: Goal: Risk for impaired skin integrity will decrease Outcome: Progressing

## 2023-09-06 NOTE — Progress Notes (Signed)
No changes overnight.  The risks and benefits of right hip repair were discussed with the patient, including the possibility of infection, nerve injury, vessel injury, wound breakdown, arthritis, symptomatic hardware, DVT/ PE, loss of motion, malunion, nonunion, and need for further surgery among others.  These risks were acknowledged and consent was provided to proceed.  Myrene Galas, MD [facility], [facility] [phone]

## 2023-09-06 NOTE — Anesthesia Procedure Notes (Signed)
Procedure Name: Intubation Date/Time: 09/06/2023 8:32 AM  Performed by: Thomasene Ripple, CRNAPre-anesthesia Checklist: Patient identified, Emergency Drugs available, Suction available and Patient being monitored Patient Re-evaluated:Patient Re-evaluated prior to induction Oxygen Delivery Method: Circle System Utilized Preoxygenation: Pre-oxygenation with 100% oxygen Induction Type: IV induction Ventilation: Mask ventilation without difficulty Laryngoscope Size: Miller and 3 Grade View: Grade I Tube type: Oral Tube size: 7.0 mm Number of attempts: 1 Airway Equipment and Method: Stylet and Oral airway Placement Confirmation: ETT inserted through vocal cords under direct vision, positive ETCO2 and breath sounds checked- equal and bilateral Secured at: 20 cm Tube secured with: Tape Dental Injury: Teeth and Oropharynx as per pre-operative assessment

## 2023-09-06 NOTE — Progress Notes (Signed)
PROGRESS NOTE    Sarah Harper  YQM:[id number] DOB: 24-May-1941 DOA: 09/05/2023 PCP: Judy Pimple, MD    Brief Narrative:   Sarah Harper is a 83 y.o. female with past medical history significant for HTN, hypothyroidism, dyslipidemia, GERD, osteopenia, OSA on CPAP who presented to [facility] ED on 1/15 with complaint of right hip pain.  Patient reports she tripped and fell onto her right side without loss of consciousness.  She was unable to ambulate or get up after event.  EMS was called to her home where there was noticeable shortening and rotation of the right leg.  Patient denied chest pain, no dizziness, no shortness of breath, no palpitations.  Patient was transported to the ED for further evaluation and management.  In the ED, temperature 99.3 F, HR 84, RR 18, BP 166/105, SpO2 95% on room air.  WBC of 7.2, hemoglobin 12.9, platelet count 185.  Sodium 126, potassium 3.2, chloride 93, CO2 21, glucose 106, BUN 9, creatinine 0.74.  TSH 1.538.  Chest x-ray with no active cardiopulmonary disease process.  X-ray right pelvis with acute impacted, angulated, displaced intertrochanteric fracture proximal right femur.  Right femur x-ray with intertrochanteric fracture right femoral neck.  Orthopedics was consulted.  TRH consulted for admission for further evaluation management of right femur fracture.  Assessment & Plan:    Right displaced, angulated, impacted intertrochanteric fracture proximal right femur Patient presenting to the ED with acute right hip pain, inability to ambulate following mechanical fall at home.  Denies loss of consciousness or striking her head.  Imaging notable for acute impacted, angulated displaced intertrochanteric fracture of proximal right femur.  Orthopedics was consulted and patient underwent intramedullary nailing of the right hip on 09/06/2023 by Dr. Carola Frost. -- Orthopedics following, appreciate assistance -- WBAT RLE -- Lovenox for DVT prophylaxis -- PT/OT evaluation:  Pending -- Percocet 1-2 tabs p.o. q4h PRN moderate pain  -- Morphine 1-2 mg IV q2h PRN severe pain -- Robaxin 500 mg p.o. q6h PRN muscle spasms -- Outpatient follow-up with orthopedics 2 weeks for suture removal and wound check  Hyponatremia Etiology likely secondary to SIADH versus recent use of Bactrim.  Serum osmolality 272, urine osmolality 319, urine sodium 80. -- Na 126>126 -- Fluid restriction 1.2 L/day -- Repeat BMP in a.m.  Hypokalemia Repleted.  Essential hypertension -- Amlodipine 2.5 mg p.o. daily -- Metoprolol tartrate 12.5 mg p.o. twice daily  Hypothyroidism TSH 1.53, within normal limits. -- Levothyroxine 75 mcg p.o. daily  History of dyslipidemia Currently not on medications prior to admission.  GERD -- Protonix 40 mg p.o. daily -- Pepcid 40 mg p.o. daily.  Heartburn/indigestion -- Carafate 1 g p.o. 3 times daily as needed esophagitis   DVT prophylaxis: SCDs Start: 09/06/23 1107 enoxaparin (LOVENOX) injection 40 mg Start: 09/05/23 1200    Code Status: Full Code Family Communication: Updated spouse present at bedside this afternoon  Disposition Plan:  Level of care: Telemetry Medical Status is: Inpatient Remains inpatient appropriate because: Pending PT/OT evaluation, may need SNF on discharge    Consultants:  Orthopedics, Dr. Carola Frost  Procedures:  Right femur intramedullary nailing, Dr. Carola Frost 1/16  Antimicrobials:  Perioperative cefazolin   Subjective: Seen examined bedside, resting calmly.  Lying in bed.  Just returned from PACU after IM nailing to right femur fracture.  Slightly confused, complaining of nausea and episode of vomiting postoperatively.  Spouse present at bedside.  Denies pain.  No other specific questions, concerns or complaints at this time.  Denies  headache, no vision changes, no dizziness, no chest pain, no shortness of breath, no abdominal pain, no fever/chills, no diarrhea, no focal weakness, no paresthesias.  No acute events  overnight per nursing staff.  Objective: Vitals:   09/06/23 1015 09/06/23 1030 09/06/23 1151 09/06/23 1151  BP: (!) 179/88 (!) 183/86 (!) 167/94 (!) 167/94  Pulse: (!) 56 (!) 58 75 72  Resp: 11 12    Temp:  97.9 F (36.6 C)    TempSrc:      SpO2: 96% 96% 99% 99%  Weight:      Height:        Intake/Output Summary (Last 24 hours) at 09/06/2023 1330 Last data filed at 09/06/2023 1010 Gross per 24 hour  Intake 1080 ml  Output 1525 ml  Net -445 ml   Filed Weights   09/06/23 0717  Weight: 59 kg    Examination:  Physical Exam: GEN: NAD, alert, slightly confused to place, but oriented to person/time/situation HEENT: NCAT, PERRL, EOMI, sclera clear, MMM PULM: CTAB w/o wheezes/crackles, normal respiratory effort, on 2 L nasal cannula CV: RRR w/o M/G/R GI: abd soft, NTND, NABS, no R/G/M MSK: Right hip with surgical dressings in place, clean/dry/intact without surrounding erythema/ecchymosis, no peripheral edema, muscle strength globally intact 5/5 bilateral upper/lower extremities, neurovascularly intact NEURO: No focal neurological deficits PSYCH: normal mood/affect Integumentary: No concerning rashes/lesions/wounds noted on exposed skin surfaces.    Data Reviewed: I have personally reviewed following labs and imaging studies  CBC: Recent Labs  Lab 09/05/23 0805 09/05/23 1255 09/06/23 0527  WBC 7.2 8.4 5.6  NEUTROABS 5.8  --   --   HGB 12.9 12.5 12.6  HCT 38.4 37.6 36.9  MCV 86.3 87.0 85.6  PLT 185 201 188   Basic Metabolic Panel: Recent Labs  Lab 09/05/23 0805 09/05/23 1255 09/06/23 0527  NA 126*  --  126*  K 3.2*  --  4.1  CL 93*  --  94*  CO2 21*  --  23  GLUCOSE 106*  --  123*  BUN 9  --  13  CREATININE 0.74 0.78 0.77  CALCIUM 8.8* 8.9 9.0  MG  --  1.7  --    GFR: Estimated Creatinine Clearance: 46.8 mL/min (by C-G formula based on SCr of 0.77 mg/dL). Liver Function Tests: Recent Labs  Lab 09/05/23 1255  ALBUMIN 3.8   No results for input(s):  "LIPASE", "AMYLASE" in the last 168 hours. No results for input(s): "AMMONIA" in the last 168 hours. Coagulation Profile: No results for input(s): "INR", "PROTIME" in the last 168 hours. Cardiac Enzymes: No results for input(s): "CKTOTAL", "CKMB", "CKMBINDEX", "TROPONINI" in the last 168 hours. BNP (last 3 results) No results for input(s): "PROBNP" in the last 8760 hours. HbA1C: No results for input(s): "HGBA1C" in the last 72 hours. CBG: No results for input(s): "GLUCAP" in the last 168 hours. Lipid Profile: No results for input(s): "CHOL", "HDL", "LDLCALC", "TRIG", "CHOLHDL", "LDLDIRECT" in the last 72 hours. Thyroid Function Tests: Recent Labs    09/05/23 0805  TSH 1.538   Anemia Panel: No results for input(s): "VITAMINB12", "FOLATE", "FERRITIN", "TIBC", "IRON", "RETICCTPCT" in the last 72 hours. Sepsis Labs: No results for input(s): "PROCALCITON", "LATICACIDVEN" in the last 168 hours.  Recent Results (from the past 240 hours)  Urine Culture     Status: Abnormal   Collection Time: 09/03/23 10:26 AM   Specimen: Urine  Result Value Ref Range Status   MICRO NUMBER: [id number]  Final   SPECIMEN QUALITY: Adequate  Final   Sample Source URINE  Final   STATUS: FINAL  Final   ISOLATE 1: Escherichia coli (A)  Final    Comment: 50,000-100,000 CFU/mL of Escherichia coli      Susceptibility   Escherichia coli - URINE CULTURE, REFLEX    AMOX/CLAVULANIC 8 Sensitive     AMPICILLIN >=32 Resistant     AMPICILLIN/SULBACTAM >=32 Resistant     CEFAZOLIN* <=4 Not Reportable      * For infections other than uncomplicated UTI caused by E. coli, K. pneumoniae or P. mirabilis: Cefazolin is resistant if MIC > or = 8 mcg/mL. (Distinguishing susceptible versus intermediate for isolates with MIC < or = 4 mcg/mL requires additional testing.) For uncomplicated UTI caused by E. coli, K. pneumoniae or P. mirabilis: Cefazolin is susceptible if MIC <32 mcg/mL and predicts susceptible to the oral  agents cefaclor, cefdinir, cefpodoxime, cefprozil, cefuroxime, cephalexin and loracarbef.     CEFTAZIDIME <=1 Sensitive     CEFEPIME <=1 Sensitive     CEFTRIAXONE <=1 Sensitive     CIPROFLOXACIN <=0.25 Sensitive     LEVOFLOXACIN <=0.12 Sensitive     GENTAMICIN <=1 Sensitive     IMIPENEM <=0.25 Sensitive     NITROFURANTOIN <=16 Sensitive     PIP/TAZO <=4 Sensitive     TOBRAMYCIN <=1 Sensitive     TRIMETH/SULFA* <=20 Sensitive      * For infections other than uncomplicated UTI caused by E. coli, K. pneumoniae or P. mirabilis: Cefazolin is resistant if MIC > or = 8 mcg/mL. (Distinguishing susceptible versus intermediate for isolates with MIC < or = 4 mcg/mL requires additional testing.) For uncomplicated UTI caused by E. coli, K. pneumoniae or P. mirabilis: Cefazolin is susceptible if MIC <32 mcg/mL and predicts susceptible to the oral agents cefaclor, cefdinir, cefpodoxime, cefprozil, cefuroxime, cephalexin and loracarbef. Legend: S = Susceptible  I = Intermediate R = Resistant  NS = Not susceptible SDD = Susceptible Dose Dependent * = Not Tested  NR = Not Reported **NN = See Therapy Comments   Surgical pcr screen     Status: None   Collection Time: 09/05/23  3:41 PM   Specimen: Nasal Mucosa; Nasal Swab  Result Value Ref Range Status   MRSA, PCR NEGATIVE NEGATIVE Final   Staphylococcus aureus NEGATIVE NEGATIVE Final    Comment: (NOTE) The Xpert SA Assay (FDA approved for NASAL specimens in patients 37 years of age and older), is one component of a comprehensive surveillance program. It is not intended to diagnose infection nor to guide or monitor treatment. Performed at [facility], [address] [place], [place] [address]          Radiology Studies: DG C-Arm 1-60 Min-No Report Result Date: 09/06/2023 Fluoroscopy was utilized by the requesting physician.  No radiographic interpretation.   DG FEMUR, MIN 2 VIEWS RIGHT Result Date: 09/05/2023 CLINICAL  DATA:  Fall and right hip fracture. EXAM: RIGHT FEMUR 2 VIEWS COMPARISON:  Right hip radiograph dated 12/04/2023. FINDINGS: Comminuted appearing intertrochanteric fracture of the right femoral neck. The bones are osteopenic. No dislocation. The soft tissues are unremarkable. IMPRESSION: Intertrochanteric fracture of the right femoral neck. Electronically Signed   By: Elgie Collard M.D.   On: 09/05/2023 12:29   DG Chest 1 View Result Date: 09/05/2023 CLINICAL DATA:  Right hip fracture EXAM: CHEST  1 VIEW COMPARISON:  11/10/2020 FINDINGS: The heart size and mediastinal contours are within normal limits. Aortic atherosclerosis. Both lungs are clear. The visualized skeletal structures  are unremarkable. IMPRESSION: No active disease. Electronically Signed   By: Duanne Guess D.O.   On: 09/05/2023 12:25   DG Hip Unilat  With Pelvis 2-3 Views Right Result Date: 09/05/2023 CLINICAL DATA:  Fall onto right hip. EXAM: DG HIP (WITH OR WITHOUT PELVIS) 2-3V RIGHT COMPARISON:  None Available. FINDINGS: There is an acute impacted intertrochanteric fracture of the proximal right femur with fracture margins extending through the greater and lesser trochanters. There is approximately 1.8 cm of lateral displacement of the distal segment with apex varus angulation. The right femoral head is seated within the acetabulum. Evaluation of the sacrum is limited due to overlying bowel-gas. The sacroiliac joints and pubic symphysis otherwise appear anatomically aligned. IMPRESSION: Acute impacted, angulated, and displaced intertrochanteric fracture of the proximal right femur. Electronically Signed   By: Hart Robinsons M.D.   On: 09/05/2023 08:21        Scheduled Meds:  amisulpride       amLODipine  2.5 mg Oral Daily   docusate sodium  100 mg Oral BID   enoxaparin (LOVENOX) injection  40 mg Subcutaneous Q24H   levothyroxine  75 mcg Oral Daily   metoprolol tartrate  12.5 mg Oral BID   pantoprazole  40 mg Oral Daily    Continuous Infusions:   ceFAZolin (ANCEF) IV 2 g (09/06/23 1131)     LOS: 1 day    Time spent: 53 minutes spent on chart review, discussion with nursing staff, consultants, updating family and interview/physical exam; more than 50% of that time was spent in counseling and/or coordination of care.    Alvira Philips [place], DO [facility] Available via Epic secure chat 7am-7pm After these hours, please refer to coverage provider listed on [email or web address] 09/06/2023, 1:30 PM

## 2023-09-06 NOTE — Plan of Care (Signed)
Problem: Education: Goal: Knowledge of General Education information will improve Description: Including pain rating scale, medication(s)/side effects and non-pharmacologic comfort measures 09/06/2023 0529 by Debbrah Alar, RN Outcome: Not Progressing 09/06/2023 0529 by Debbrah Alar, RN Outcome: Progressing 09/05/2023 2201 by Debbrah Alar, RN Outcome: Not Progressing   Problem: Health Behavior/Discharge Planning: Goal: Ability to manage health-related needs will improve 09/06/2023 0529 by Debbrah Alar, RN Outcome: Not Progressing 09/06/2023 0529 by Velia Meyer D, RN Outcome: Progressing 09/05/2023 2201 by Velia Meyer D, RN Outcome: Not Progressing   Problem: Clinical Measurements: Goal: Ability to maintain clinical measurements within normal limits will improve 09/06/2023 0529 by Debbrah Alar, RN Outcome: Not Progressing 09/06/2023 0529 by Velia Meyer D, RN Outcome: Progressing 09/05/2023 2201 by Velia Meyer D, RN Outcome: Not Progressing Goal: Will remain free from infection 09/06/2023 0529 by Debbrah Alar, RN Outcome: Not Progressing 09/06/2023 0529 by Velia Meyer D, RN Outcome: Progressing 09/05/2023 2201 by Debbrah Alar, RN Outcome: Not Progressing Goal: Diagnostic test results will improve 09/06/2023 0529 by Debbrah Alar, RN Outcome: Not Progressing 09/06/2023 0529 by Velia Meyer D, RN Outcome: Progressing 09/05/2023 2201 by Velia Meyer D, RN Outcome: Not Progressing Goal: Respiratory complications will improve 09/06/2023 0529 by Debbrah Alar, RN Outcome: Not Progressing 09/06/2023 0529 by Velia Meyer D, RN Outcome: Progressing 09/05/2023 2201 by Velia Meyer D, RN Outcome: Not Progressing Goal: Cardiovascular complication will be avoided 09/06/2023 0529 by Debbrah Alar, RN Outcome: Not Progressing 09/06/2023 0529 by Velia Meyer D, RN Outcome: Progressing 09/05/2023 2201  by Debbrah Alar, RN Outcome: Not Progressing   Problem: Activity: Goal: Risk for activity intolerance will decrease 09/06/2023 0529 by Debbrah Alar, RN Outcome: Not Progressing 09/06/2023 0529 by Debbrah Alar, RN Outcome: Progressing 09/05/2023 2201 by Debbrah Alar, RN Outcome: Not Progressing   Problem: Nutrition: Goal: Adequate nutrition will be maintained 09/06/2023 0529 by Debbrah Alar, RN Outcome: Not Progressing 09/06/2023 0529 by Debbrah Alar, RN Outcome: Progressing 09/05/2023 2201 by Velia Meyer D, RN Outcome: Not Progressing   Problem: Coping: Goal: Level of anxiety will decrease 09/06/2023 0529 by Debbrah Alar, RN Outcome: Not Progressing 09/06/2023 0529 by Debbrah Alar, RN Outcome: Progressing 09/05/2023 2201 by Velia Meyer D, RN Outcome: Not Progressing   Problem: Elimination: Goal: Will not experience complications related to bowel motility 09/06/2023 0529 by Debbrah Alar, RN Outcome: Not Progressing 09/06/2023 0529 by Debbrah Alar, RN Outcome: Progressing 09/05/2023 2201 by Debbrah Alar, RN Outcome: Not Progressing Goal: Will not experience complications related to urinary retention 09/06/2023 0529 by Debbrah Alar, RN Outcome: Not Progressing 09/06/2023 0529 by Debbrah Alar, RN Outcome: Progressing 09/05/2023 2201 by Debbrah Alar, RN Outcome: Not Progressing   Problem: Pain Managment: Goal: General experience of comfort will improve and/or be controlled 09/06/2023 0529 by Debbrah Alar, RN Outcome: Not Progressing 09/06/2023 0529 by Debbrah Alar, RN Outcome: Progressing 09/05/2023 2201 by Velia Meyer D, RN Outcome: Not Progressing   Problem: Safety: Goal: Ability to remain free from injury will improve 09/06/2023 0529 by Debbrah Alar, RN Outcome: Not Progressing 09/06/2023 0529 by Debbrah Alar, RN Outcome: Progressing 09/05/2023 2201 by  Velia Meyer D, RN Outcome: Not Progressing   Problem: Skin Integrity: Goal: Risk for impaired skin integrity will decrease 09/06/2023 0529 by Debbrah Alar, RN Outcome: Not Progressing 09/06/2023 0529 by Debbrah Alar, RN Outcome: Progressing 09/05/2023 2201 by Velia Meyer  D, RN Outcome: Not Progressing

## 2023-09-06 NOTE — Op Note (Signed)
PATIENT:  Sarah Harper  13-Mar-1941 female   MEDICAL RECORD NUMBER: [id number]  PRE-OPERATIVE DIAGNOSIS:  RIGHT INTERTROCHANTERIC HIP FRACTURE  POST-OPERATIVE DIAGNOSIS:  RIGHT INTERTROCHANTERIC HIP FRACTURE  PROCEDURE:  INTRAMEDULLARY NAILING OF THE RIGHT HIP using a Biomet Affixus nail 9 mm diameter locked short.  SURGEON:  Doralee Albino. Carola Frost, M.D.  ASSISTANT:  PA Student.  ANESTHESIA:  General.  COMPLICATIONS:  None.  ESTIMATED BLOOD LOSS:  Less than 100 mL.  DISPOSITION:  To PACU.  CONDITION:  Stable.  DELAY START OF DVT PROPHYLAXIS BECAUSE OF BLEEDING RISK: NO  BRIEF SUMMARY AND INDICATION OF PROCEDURE:  KISHAWNA Harper is a 83 y.o. year- old with multiple medical problems.  I discussed with the patient risks and benefits of surgical treatment including the potential for malunion, nonunion, symptomatic hardware, heart attack, stroke, neurovascular injury, bleeding, and others.  After acknowledgement of these risks, consent was provided to proceed.  BRIEF SUMMARY OF PROCEDURE:  The patient was taken to the operating room where general anesthesia was induced.  She was positioned supine on the Hana fracture table.  A closed reduction maneuver was performed of the fractured proximal femur and this was confirmed on both AP and lateral xray views. A thorough scrub and wash with chlorhexidine and then Betadine scrub and paint was performed.  After sterile drapes and time-out, a long instrument was used to identify the appropriate starting position under C-arm on both AP and lateral images.  A 3 cm incision was made proximal to the greater trochanter.  The curved cannulated awl was inserted just medial to the tip of the lateral trochanter and then the starting guidewire advanced into the proximal femur.  This was checked on AP and lateral views.  The starting reamer was engaged with the soft tissue protected by a sleeve.  The 9 mm short nail was then inserted but could not reach  the appropriate depth.  A ball-tipped guidewire was then inserted and the femur sequentially reamed up to 11 mm.  We did encounter chatter at 9 mm confirming the small diameter bone.  The short nail was reinserted to the appropriate depth.  The guidewire for the lag screw was then inserted with the appropriate anteversion to make sure it was in a center-center position.  This was measured and the lag screw placed with excellent purchase and position checked on both views. The set screw was then engaged within the groove of the lag screw, which was allowed to telescope.  Traction was released and compression achieved with the Compression device over the lag screw.  This was followed by placement of one distal locking screw using the jig.  This was confirmed on AP and lateral images. Wounds were irrigated thoroughly, closed in a standard layered fashion. Sterile gently compressive dressings were applied.  The patient was awakened from anesthesia and transported to the PACU in stable condition.  PROGNOSIS:  The patient will be weightbearing as tolerated with physical therapy beginning DVT prophylaxis with Lovenox.  She has no range of motion precautions.  We will continue to follow while in the hospital.  Anticipate follow up in the office in 2 weeks for removal of sutures and further evaluation.     Doralee Albino. Carola Frost, M.D.

## 2023-09-06 NOTE — TOC CAGE-AID Note (Signed)
Transition of Care [facility]) - CAGE-AID Screening   Patient Details  Name: RAKITA JESSER MRN: [id number] Date of Birth: 10-14-1940  [facility] CM/SW Contact:    Leota Sauers, RN Phone Number: 09/06/2023, 6:08 AM   Clinical Narrative:  Patient denies use of alcohol, endorses marijuana use. Denies resources at this time.   CAGE-AID Screening:    Have You Ever Felt You Ought to Cut Down on Your Drinking or Drug Use?: No Have People Annoyed You By Critizing Your Drinking Or Drug Use?: No Have You Felt Bad Or Guilty About Your Drinking Or Drug Use?: No Have You Ever Had a Drink or Used Drugs First Thing In The Morning to Steady Your Nerves or to Get Rid of a Hangover?: No CAGE-AID Score: 0  Substance Abuse Education Offered: No

## 2023-09-06 NOTE — Evaluation (Signed)
Clinical/Bedside Swallow Evaluation Patient Details  Name: Sarah Harper MRN: [id number] Date of Birth: May 13, 1941  Today's Date: 09/06/2023 Time: SLP Start Time (ACUTE ONLY): 1600 SLP Stop Time (ACUTE ONLY): 1612 SLP Time Calculation (min) (ACUTE ONLY): 12 min  Past Medical History:  Past Medical History:  Diagnosis Date   Anxiety    Cataract 1997   Colon cancer screening 02/05/2018   DDD (degenerative disc disease)    chronic back pain   ETD (eustachian tube dysfunction)    GERD (gastroesophageal reflux disease)    Hypertension 2022   IBS (irritable bowel syndrome)    Meniere's disease    Migraine    still gets visual aura from time to time   Mild cognitive impairment    Osteoporosis    Sleep apnea    CPAP   Stroke (HCC) 2022   Syncopal episodes    after work-up - possible seizures?   Thyroid disease    hypothyroid   TIA (transient ischemic attack) 11/09/2020   Past Surgical History:  Past Surgical History:  Procedure Laterality Date   APPENDECTOMY     CATARACT EXTRACTION W/PHACO Right 05/21/2019   Procedure: CATARACT EXTRACTION PHACO AND INTRAOCULAR LENS PLACEMENT (IOC) RIGHT panoptix lens  00:35.0  21.7%  7.61;  Surgeon: Lockie Mola, MD;  Location: [facility];  Service: Ophthalmology;  Laterality: Right;  sleep apnea requests early   CATARACT EXTRACTION W/PHACO Left 06/11/2019   Procedure: CATARACT EXTRACTION PHACO AND INTRAOCULAR LENS PLACEMENT (IOC) LEFT PANOPTIX TORIC LENS  00:50.0  20.3%  10.21;  Surgeon: Lockie Mola, MD;  Location: [facility];  Service: Ophthalmology;  Laterality: Left;  sleep apnea requests early   COLONOSCOPY     last 2012 - [facility]   ESOPHAGOGASTRODUODENOSCOPY     Multiple, last 01/07/2017 with Savary dilation to 18 mm   EYE SURGERY  August 2022   Cataracs removed   HEMORRHOID BANDING  2018   [facility]   LUMBAR DISC SURGERY  1984   L5    HPI:  Sarah Harper is an 83 yo female presenting to ED 1/15  after a mechanical fall. Admitted for surgical management of R closed hip fx, underwent repair 1/16. MBS 01/02/19 shows functional oropharyngeal swallow with "pharyngeal ears". Pt's main complaint at that time and during subsequent BSE 11/09/20 was the sensation that food/drinks fall posteriorly before she is ready, which was not demonstrated on the MBS. PMH includes HTN, hypothyroidism, severe GERD, osteopenia, dyslipidemia, sleep apnea, prior CVA (2022)    Assessment / Plan / Recommendation  Clinical Impression  Observed pt with trials of thin liquids and regular texture solids without overt s/s of dysphagia or aspiration. She denies a significant history of dysphagia and states her esophageal reflux is now better managed. Recommend she continue her current diet without SLP f/u. SLP Visit Diagnosis: Dysphagia, pharyngoesophageal phase (R13.14);Dysphagia, unspecified (R13.10)    Aspiration Risk  Mild aspiration risk    Diet Recommendation Regular;Thin liquid    Liquid Administration via: Cup;Straw Medication Administration: Whole meds with liquid Supervision: Patient able to self feed Compensations: Slow rate;Small sips/bites Postural Changes: Seated upright at 90 degrees;Remain upright for at least 30 minutes after po intake    Other  Recommendations Oral Care Recommendations: Oral care BID    Recommendations for follow up therapy are one component of a multi-disciplinary discharge planning process, led by the attending physician.  Recommendations may be updated based on patient status, additional functional criteria and insurance authorization.  Follow up  Recommendations No SLP follow up      Assistance Recommended at Discharge    Functional Status Assessment Patient has not had a recent decline in their functional status  Frequency and Duration            Prognosis Prognosis for improved oropharyngeal function: Good      Swallow Study   General HPI: Sarah Harper is an 83 yo  female presenting to ED 1/15 after a mechanical fall. Admitted for surgical management of R closed hip fx, underwent repair 1/16. MBS 01/02/19 shows functional oropharyngeal swallow with "pharyngeal ears". Pt's main complaint at that time and during subsequent BSE 11/09/20 was the sensation that food/drinks fall posteriorly before she is ready, which was not demonstrated on the MBS. PMH includes HTN, hypothyroidism, severe GERD, osteopenia, dyslipidemia, sleep apnea, prior CVA (2022) Type of Study: Bedside Swallow Evaluation Previous Swallow Assessment: see HPI Diet Prior to this Study: Regular;Thin liquids (Level 0) Temperature Spikes Noted: No Respiratory Status: Room air History of Recent Intubation: No Behavior/Cognition: Alert;Cooperative Oral Cavity Assessment: Within Functional Limits Oral Care Completed by SLP: No Oral Cavity - Dentition: Adequate natural dentition Vision: Functional for self-feeding Self-Feeding Abilities: Able to feed self Patient Positioning: Postural control adequate for testing Baseline Vocal Quality: Normal Volitional Cough: Strong Volitional Swallow: Able to elicit    Oral/Motor/Sensory Function Overall Oral Motor/Sensory Function: Within functional limits   Ice Chips Ice chips: Not tested   Thin Liquid Thin Liquid: Within functional limits Presentation: Straw;Self Fed    Nectar Thick Nectar Thick Liquid: Not tested   Honey Thick Honey Thick Liquid: Not tested   Puree Puree: Not tested   Solid     Solid: Within functional limits Presentation: Self Fed      Gwynneth Aliment, M.A., CF-SLP Speech Language Pathology, Acute Rehabilitation Services  Secure Chat preferred [phone]  09/06/2023,4:19 PM

## 2023-09-07 ENCOUNTER — Encounter (HOSPITAL_COMMUNITY): Payer: Self-pay | Admitting: Orthopedic Surgery

## 2023-09-07 DIAGNOSIS — E222 Syndrome of inappropriate secretion of antidiuretic hormone: Secondary | ICD-10-CM | POA: Diagnosis not present

## 2023-09-07 DIAGNOSIS — S72001A Fracture of unspecified part of neck of right femur, initial encounter for closed fracture: Secondary | ICD-10-CM | POA: Diagnosis not present

## 2023-09-07 LAB — CBC
HCT: 33.3 % — ABNORMAL LOW (ref 36.0–46.0)
Hemoglobin: 11.3 g/dL — ABNORMAL LOW (ref 12.0–15.0)
MCH: 28.8 pg (ref 26.0–34.0)
MCHC: 33.9 g/dL (ref 30.0–36.0)
MCV: 84.7 fL (ref 80.0–100.0)
Platelets: 156 10*3/uL (ref 150–400)
RBC: 3.93 MIL/uL (ref 3.87–5.11)
RDW: 13.4 % (ref 11.5–15.5)
WBC: 7.4 10*3/uL (ref 4.0–10.5)
nRBC: 0 % (ref 0.0–0.2)

## 2023-09-07 LAB — BASIC METABOLIC PANEL
Anion gap: 8 (ref 5–15)
BUN: 12 mg/dL (ref 8–23)
CO2: 24 mmol/L (ref 22–32)
Calcium: 8.7 mg/dL — ABNORMAL LOW (ref 8.9–10.3)
Chloride: 95 mmol/L — ABNORMAL LOW (ref 98–111)
Creatinine, Ser: 0.74 mg/dL (ref 0.44–1.00)
GFR, Estimated: >60 mL/min (ref >60)
Glucose, Bld: 114 mg/dL — ABNORMAL HIGH (ref 70–99)
Potassium: 4.2 mmol/L (ref 3.5–5.1)
Sodium: 127 mmol/L — ABNORMAL LOW (ref 135–145)

## 2023-09-07 LAB — VITAMIN D 25 HYDROXY (VIT D DEFICIENCY, FRACTURES): Vit D, 25-Hydroxy: 44.4 ng/mL (ref 30–100)

## 2023-09-07 LAB — MAGNESIUM: Magnesium: 1.6 mg/dL — ABNORMAL LOW (ref 1.7–2.4)

## 2023-09-07 MED ORDER — TRAZODONE HCL 50 MG PO TABS
25.0000 mg | ORAL_TABLET | Freq: Every day | ORAL | Status: DC
  Administered 2023-09-07 – 2023-09-10 (×4): 25 mg via ORAL
  Filled 2023-09-07 (×4): qty 1

## 2023-09-07 MED ORDER — LORAZEPAM 0.5 MG PO TABS
0.5000 mg | ORAL_TABLET | Freq: Four times a day (QID) | ORAL | Status: DC | PRN
  Administered 2023-09-07 – 2023-09-10 (×4): 0.5 mg via ORAL
  Filled 2023-09-07 (×4): qty 1

## 2023-09-07 MED ORDER — HYDRALAZINE HCL 10 MG PO TABS: 10.0000 mg | ORAL_TABLET | Freq: Four times a day (QID) | ORAL | Status: DC | PRN

## 2023-09-07 MED ORDER — MAGNESIUM SULFATE 2 GM/50ML IV SOLN
2.0000 g | Freq: Once | INTRAVENOUS | Status: AC
  Administered 2023-09-07: 2 g via INTRAVENOUS
  Filled 2023-09-07: qty 50

## 2023-09-07 MED ORDER — MELATONIN 3 MG PO TABS
3.0000 mg | ORAL_TABLET | Freq: Every day | ORAL | Status: DC
  Administered 2023-09-07 – 2023-09-16 (×9): 3 mg via ORAL
  Filled 2023-09-07 (×9): qty 1

## 2023-09-07 NOTE — Progress Notes (Signed)
PROGRESS NOTE    Sarah Harper  UVO:[id number] DOB: Jan 12, 1941 DOA: 09/05/2023 PCP: Judy Pimple, MD    Brief Narrative:   Sarah Harper is a 83 y.o. female with past medical history significant for HTN, hypothyroidism, dyslipidemia, GERD, osteopenia, OSA on CPAP who presented to [facility] ED on 1/15 with complaint of right hip pain.  Patient reports she tripped and fell onto her right side without loss of consciousness.  She was unable to ambulate or get up after event.  EMS was called to her home where there was noticeable shortening and rotation of the right leg.  Patient denied chest pain, no dizziness, no shortness of breath, no palpitations.  Patient was transported to the ED for further evaluation and management.  In the ED, temperature 99.3 F, HR 84, RR 18, BP 166/105, SpO2 95% on room air.  WBC of 7.2, hemoglobin 12.9, platelet count 185.  Sodium 126, potassium 3.2, chloride 93, CO2 21, glucose 106, BUN 9, creatinine 0.74.  TSH 1.538.  Chest x-ray with no active cardiopulmonary disease process.  X-ray right pelvis with acute impacted, angulated, displaced intertrochanteric fracture proximal right femur.  Right femur x-ray with intertrochanteric fracture right femoral neck.  Orthopedics was consulted.  TRH consulted for admission for further evaluation management of right femur fracture.  Assessment & Plan:    Right displaced, angulated, impacted intertrochanteric fracture proximal right femur Patient presenting to the ED with acute right hip pain, inability to ambulate following mechanical fall at home.  Denies loss of consciousness or striking her head.  Imaging notable for acute impacted, angulated displaced intertrochanteric fracture of proximal right femur.  Orthopedics was consulted and patient underwent intramedullary nailing of the right hip on 09/06/2023 by Dr. Carola Frost. -- Orthopedics following, appreciate assistance -- vitamin D 25-hydroxy level 44.40 -- WBAT RLE -- Lovenox for  DVT prophylaxis -- PT/OT evaluation: Pending -- Percocet 1-2 tabs p.o. q4h PRN moderate pain  -- Morphine 1-2 mg IV q2h PRN severe pain -- Robaxin 500 mg p.o. q6h PRN muscle spasms -- Outpatient follow-up with orthopedics 2 weeks for suture removal and wound check  Hyponatremia Etiology likely secondary to SIADH versus recent use of Bactrim.  Serum osmolality 272, urine osmolality 319, urine sodium 80. -- Na 126>126>127 -- Fluid restriction 1.2 L/day -- Repeat BMP in a.m.  Hypokalemia Repleted.  Essential hypertension -- Amlodipine 2.5 mg p.o. daily -- Metoprolol tartrate 12.5 mg p.o. twice daily  Hypothyroidism TSH 1.53, within normal limits. -- Levothyroxine 75 mcg p.o. daily  History of dyslipidemia Currently not on medications prior to admission.  GERD -- Protonix 40 mg p.o. daily -- Pepcid 40 mg p.o. daily.  Heartburn/indigestion -- Carafate 1 g p.o. 3 times daily as needed esophagitis  Insomnia -- Melatonin 3 minutes p.o. nightly -- Trazodone 25 m p.o. nightly   DVT prophylaxis: SCDs Start: 09/06/23 1107 enoxaparin (LOVENOX) injection 40 mg Start: 09/05/23 1200    Code Status: Full Code Family Communication: Updated daughter present at bedside this afternoon  Disposition Plan:  Level of care: Telemetry Medical Status is: Inpatient Remains inpatient appropriate because: Will need SNF placement, TOC consulted    Consultants:  Orthopedics, Dr. Carola Frost  Procedures:  Right femur intramedullary nailing, Dr. Carola Frost 1/16  Antimicrobials:  Perioperative cefazolin   Subjective: Seen examined bedside, resting calmly.  Lying in bed.  Daughter present at bedside.  Poor sleep overnight.  Was confused postoperatively, daughter reports slightly confused this morning but improved since yesterday.  Complaining of  mild pain to operative site.  Seen by OT with recommendation of SNF placement.  Pending PT evaluation.   No other specific questions, concerns or complaints at  this time.  Denies headache, no vision changes, no dizziness, no chest pain, no shortness of breath, no abdominal pain, no fever/chills, no diarrhea, no focal weakness, no paresthesias.  No acute events overnight per nursing staff.  Objective: Vitals:   09/06/23 1945 09/07/23 0033 09/07/23 0400 09/07/23 0738  BP: (!) 149/72 (!) 125/52 (!) 182/80 133/62  Pulse: 73 71 80 97  Resp:  18    Temp: 99.1 F (37.3 C) 99.3 F (37.4 C) 98.6 F (37 C) 99.9 F (37.7 C)  TempSrc: Oral Oral Oral Oral  SpO2: 100% 96% 98% 95%  Weight:      Height:        Intake/Output Summary (Last 24 hours) at 09/07/2023 1237 Last data filed at 09/07/2023 0981 Gross per 24 hour  Intake 480 ml  Output 500 ml  Net -20 ml   Filed Weights   09/06/23 0717  Weight: 59 kg    Examination:  Physical Exam: GEN: NAD, alert, oriented to person/place/time HEENT: NCAT, PERRL, EOMI, sclera clear, MMM PULM: CTAB w/o wheezes/crackles, normal respiratory effort, on room air CV: RRR w/o M/G/R GI: abd soft, NTND, NABS, no R/G/M MSK: Right hip with surgical dressings in place, clean/dry/intact without surrounding erythema/ecchymosis, no peripheral edema, muscle strength globally intact 5/5 bilateral upper/lower extremities, neurovascularly intact NEURO: No focal neurological deficits PSYCH: normal mood/affect Integumentary: No concerning rashes/lesions/wounds noted on exposed skin surfaces.    Data Reviewed: I have personally reviewed following labs and imaging studies  CBC: Recent Labs  Lab 09/05/23 0805 09/05/23 1255 09/06/23 0527 09/07/23 0642  WBC 7.2 8.4 5.6 7.4  NEUTROABS 5.8  --   --   --   HGB 12.9 12.5 12.6 11.3*  HCT 38.4 37.6 36.9 33.3*  MCV 86.3 87.0 85.6 84.7  PLT 185 201 188 156   Basic Metabolic Panel: Recent Labs  Lab 09/05/23 0805 09/05/23 1255 09/06/23 0527 09/07/23 0642  NA 126*  --  126* 127*  K 3.2*  --  4.1 4.2  CL 93*  --  94* 95*  CO2 21*  --  23 24  GLUCOSE 106*  --  123*  114*  BUN 9  --  13 12  CREATININE 0.74 0.78 0.77 0.74  CALCIUM 8.8* 8.9 9.0 8.7*  MG  --  1.7  --  1.6*   GFR: Estimated Creatinine Clearance: 46.8 mL/min (by C-G formula based on SCr of 0.74 mg/dL). Liver Function Tests: Recent Labs  Lab 09/05/23 1255  ALBUMIN 3.8   No results for input(s): "LIPASE", "AMYLASE" in the last 168 hours. No results for input(s): "AMMONIA" in the last 168 hours. Coagulation Profile: No results for input(s): "INR", "PROTIME" in the last 168 hours. Cardiac Enzymes: No results for input(s): "CKTOTAL", "CKMB", "CKMBINDEX", "TROPONINI" in the last 168 hours. BNP (last 3 results) No results for input(s): "PROBNP" in the last 8760 hours. HbA1C: No results for input(s): "HGBA1C" in the last 72 hours. CBG: No results for input(s): "GLUCAP" in the last 168 hours. Lipid Profile: No results for input(s): "CHOL", "HDL", "LDLCALC", "TRIG", "CHOLHDL", "LDLDIRECT" in the last 72 hours. Thyroid Function Tests: Recent Labs    09/05/23 0805  TSH 1.538   Anemia Panel: No results for input(s): "VITAMINB12", "FOLATE", "FERRITIN", "TIBC", "IRON", "RETICCTPCT" in the last 72 hours. Sepsis Labs: No results for input(s): "PROCALCITON", "  LATICACIDVEN" in the last 168 hours.  Recent Results (from the past 240 hours)  Urine Culture     Status: Abnormal   Collection Time: 09/03/23 10:26 AM   Specimen: Urine  Result Value Ref Range Status   MICRO NUMBER: [id number]  Final   SPECIMEN QUALITY: Adequate  Final   Sample Source URINE  Final   STATUS: FINAL  Final   ISOLATE 1: Escherichia coli (A)  Final    Comment: 50,000-100,000 CFU/mL of Escherichia coli      Susceptibility   Escherichia coli - URINE CULTURE, REFLEX    AMOX/CLAVULANIC 8 Sensitive     AMPICILLIN >=32 Resistant     AMPICILLIN/SULBACTAM >=32 Resistant     CEFAZOLIN* <=4 Not Reportable      * For infections other than uncomplicated UTI caused by E. coli, K. pneumoniae or P. mirabilis: Cefazolin is  resistant if MIC > or = 8 mcg/mL. (Distinguishing susceptible versus intermediate for isolates with MIC < or = 4 mcg/mL requires additional testing.) For uncomplicated UTI caused by E. coli, K. pneumoniae or P. mirabilis: Cefazolin is susceptible if MIC <32 mcg/mL and predicts susceptible to the oral agents cefaclor, cefdinir, cefpodoxime, cefprozil, cefuroxime, cephalexin and loracarbef.     CEFTAZIDIME <=1 Sensitive     CEFEPIME <=1 Sensitive     CEFTRIAXONE <=1 Sensitive     CIPROFLOXACIN <=0.25 Sensitive     LEVOFLOXACIN <=0.12 Sensitive     GENTAMICIN <=1 Sensitive     IMIPENEM <=0.25 Sensitive     NITROFURANTOIN <=16 Sensitive     PIP/TAZO <=4 Sensitive     TOBRAMYCIN <=1 Sensitive     TRIMETH/SULFA* <=20 Sensitive      * For infections other than uncomplicated UTI caused by E. coli, K. pneumoniae or P. mirabilis: Cefazolin is resistant if MIC > or = 8 mcg/mL. (Distinguishing susceptible versus intermediate for isolates with MIC < or = 4 mcg/mL requires additional testing.) For uncomplicated UTI caused by E. coli, K. pneumoniae or P. mirabilis: Cefazolin is susceptible if MIC <32 mcg/mL and predicts susceptible to the oral agents cefaclor, cefdinir, cefpodoxime, cefprozil, cefuroxime, cephalexin and loracarbef. Legend: S = Susceptible  I = Intermediate R = Resistant  NS = Not susceptible SDD = Susceptible Dose Dependent * = Not Tested  NR = Not Reported **NN = See Therapy Comments   Surgical pcr screen     Status: None   Collection Time: 09/05/23  3:41 PM   Specimen: Nasal Mucosa; Nasal Swab  Result Value Ref Range Status   MRSA, PCR NEGATIVE NEGATIVE Final   Staphylococcus aureus NEGATIVE NEGATIVE Final    Comment: (NOTE) The Xpert SA Assay (FDA approved for NASAL specimens in patients 45 years of age and older), is one component of a comprehensive surveillance program. It is not intended to diagnose infection nor to guide or monitor treatment. Performed at  [facility], [address], [place], [place] [address]          Radiology Studies: DG FEMUR PORT, MIN 2 VIEWS RIGHT Result Date: 09/06/2023 CLINICAL DATA:  Hip fracture, postop. EXAM: RIGHT FEMUR PORTABLE 2 VIEW COMPARISON:  Preoperative imaging. FINDINGS: Femoral intramedullary nail with trans trochanteric and distal locking screw fixation traverse intertrochanteric femur fracture. Improved fracture alignment from preoperative imaging. Recent postsurgical change includes air and edema in the soft tissues. IMPRESSION: ORIF proximal femur fracture in improved alignment. Electronically Signed   By: Narda Rutherford M.D.   On: 09/06/2023 14:59   DG  FEMUR, MIN 2 VIEWS RIGHT Result Date: 09/06/2023 CLINICAL DATA:  Open reduction and internal fixation of right femoral fracture. EXAM: RIGHT FEMUR 2 VIEWS; DG C-ARM 1-60 MIN-NO REPORT Radiation exposure index: 13.69 mGy. COMPARISON:  September 05, 2023. FINDINGS: Six intraoperative fluoroscopic images were obtained of the right hip. These demonstrate surgical internal fixation of proximal right femoral fracture. IMPRESSION: Fluoroscopic guidance provided during surgical internal fixation of proximal right femoral fracture. Electronically Signed   By: Lupita Raider M.D.   On: 09/06/2023 13:43   DG C-Arm 1-60 Min-No Report Result Date: 09/06/2023 Fluoroscopy was utilized by the requesting physician.  No radiographic interpretation.        Scheduled Meds:  amLODipine  2.5 mg Oral Daily   docusate sodium  100 mg Oral BID   enoxaparin (LOVENOX) injection  40 mg Subcutaneous Q24H   levothyroxine  75 mcg Oral Daily   melatonin  3 mg Oral QHS   metoprolol tartrate  12.5 mg Oral BID   pantoprazole  40 mg Oral Daily   Ensure Max Protein  11 oz Oral BID   traZODone  25 mg Oral QHS   Continuous Infusions:     LOS: 2 days    Time spent: 53 minutes spent on chart review, discussion with nursing staff, consultants, updating family and  interview/physical exam; more than 50% of that time was spent in counseling and/or coordination of care.    Alvira Philips [place], DO [facility] Available via Epic secure chat 7am-7pm After these hours, please refer to coverage provider listed on [email or web address] 09/07/2023, 12:37 PM

## 2023-09-07 NOTE — Plan of Care (Signed)

## 2023-09-07 NOTE — Progress Notes (Cosign Needed)
Orthopaedic Trauma Service Progress Note  Patient ID: Sarah Harper MRN: [id number] DOB/AGE: 83/25/1942 83 y.o.  Subjective:  Pt reports mild to moderate pain to R hip but doing ok  Family reports cough but pt not concerned about it. CXR negative on admission, pt is afebrile and no elevated WBC count   Pt lives with husband  Uses no assistive devices at baseline   No other complaints   ROS As above  Objective:   VITALS:   Vitals:   09/06/23 1945 09/07/23 0033 09/07/23 0400 09/07/23 0738  BP: (!) 149/72 (!) 125/52 (!) 182/80 133/62  Pulse: 73 71 80 97  Resp:  18    Temp: 99.1 F (37.3 C) 99.3 F (37.4 C) 98.6 F (37 C) 99.9 F (37.7 C)  TempSrc: Oral Oral Oral Oral  SpO2: 100% 96% 98% 95%  Weight:      Height:        Estimated body mass index is 22.31 kg/m as calculated from the following:   Height as of this encounter: 5\' 4"  (1.626 m).   Weight as of this encounter: 59 kg.   Intake/Output      01/16 0701 01/17 0700 01/17 0701 01/18 0700   P.O. 360 120   I.V. (mL/kg) 600 (10.2)    Total Intake(mL/kg) 960 (16.3) 120 (2)   Urine (mL/kg/hr) 800 (0.6)    Blood 75    Total Output 875    Net +85 +120          LABS  Results for orders placed or performed during the hospital encounter of 09/05/23 (from the past 24 hours)  CBC     Status: Abnormal   Collection Time: 09/07/23  6:42 AM  Result Value Ref Range   WBC 7.4 4.0 - 10.5 K/uL   RBC 3.93 3.87 - 5.11 MIL/uL   Hemoglobin 11.3 (L) 12.0 - 15.0 g/dL   HCT 86.5 (L) 78.4 - 69.6 %   MCV 84.7 80.0 - 100.0 fL   MCH 28.8 26.0 - 34.0 pg   MCHC 33.9 30.0 - 36.0 g/dL   RDW 29.5 28.4 - 13.2 %   Platelets 156 150 - 400 K/uL   nRBC 0.0 0.0 - 0.2 %  Basic metabolic panel     Status: Abnormal   Collection Time: 09/07/23  6:42 AM  Result Value Ref Range   Sodium 127 (L) 135 - 145 mmol/L   Potassium 4.2 3.5 - 5.1 mmol/L   Chloride 95 (L)  98 - 111 mmol/L   CO2 24 22 - 32 mmol/L   Glucose, Bld 114 (H) 70 - 99 mg/dL   BUN 12 8 - 23 mg/dL   Creatinine, Ser 4.40 0.44 - 1.00 mg/dL   Calcium 8.7 (L) 8.9 - 10.3 mg/dL   GFR, Estimated >10 >27 mL/min   Anion gap 8 5 - 15  VITAMIN D 25 Hydroxy (Vit-D Deficiency, Fractures)     Status: None   Collection Time: 09/07/23  6:42 AM  Result Value Ref Range   Vit D, 25-Hydroxy 44.40 30 - 100 ng/mL  Magnesium     Status: Abnormal   Collection Time: 09/07/23  6:42 AM  Result Value Ref Range   Magnesium 1.6 (L) 1.7 - 2.4 mg/dL     PHYSICAL EXAM:  Gen: resting comfortably  in bed, sitting upright, NAD, looks good Lungs: unlabored Cardiac: reg Ext:       Right Lower Extremity Dressing is clean, dry and intact  Extremity is warm  No DCT  Compartments are soft  No pain out of proportion with passive stretching of his toes or ankle  DPN, SPN, TN sensory functions are intact  EHL, FHL, lesser toe motor functions intact  Ankle flexion, extension, inversion eversion intact  + DP pulse    Assessment/Plan: 1 Day Post-Op   Principal Problem:   Closed right hip fracture (HCC)   Anti-infectives (From admission, onward)    Start     Dose/Rate Route Frequency Ordered Stop   09/06/23 1200  ceFAZolin (ANCEF) IVPB 2g/100 mL premix        2 g 200 mL/hr over 30 Minutes Intravenous Every 6 hours 09/06/23 1106 09/06/23 1915   09/06/23 0730  ceFAZolin (ANCEF) IVPB 2g/100 mL premix        2 g 200 mL/hr over 30 Minutes Intravenous On call to O.R. 09/06/23 0728 09/06/23 0839   09/05/23 1215  sulfamethoxazole-trimethoprim (BACTRIM DS) 800-160 MG per tablet 1 tablet        1 tablet Oral 2 times daily 09/05/23 1202 09/05/23 2208     .  POD/HD#: 1  83 y/o female s/p fall with R hip fracture   - fall   -R intertrochanteric hip fracture s/p IMN  Weightbearing WBAT with walker  No restrictions    ROM/Activity   Unrestricted ROM R hip and knee   Activity as tolerated    PT/OT  evals   TOC consult    Wound care/other   Dressing changes as needed starting 09/09/2023   Ice prn swelling      - Pain management:  Multimodal  Minimize narcotics  Favor po meds before IV   - ABL anemia/Hemodynamics  Stable  Monitor   - Medical issues   Per medicine   Hyponatremia   Cough    CXR unremarkable   Afebrile    No elevated WBC count   Aggressive IS   - DVT/PE prophylaxis:  Lovenox x 28 days post op   - ID:   Periop abx  - Metabolic Bone Disease:  Known osteoporosis  This is a fragility fracture   Vitamin d levels look ok   Meets criteria for pharmacologic treatment of osteoporosis    Dexa 2023 T score of -2.0   - Activity:  As above  - Impediments to fracture healing:  Osteoporosis    Thyroid disease   - Dispo:  Therapy evals   TOC consult for SNF   Follow up with ortho in 10-14 days for suture removal and follow up xrays      Mearl Latin, PA-C [phone] (C) 09/07/2023, 9:46 AM  [facility] [address] [place] [address] [phone] Collier Bullock (F)    After 5pm and on the weekends please log on to Amion, go to orthopaedics and the look under the [facility] Call for the provider(s) on call. You can also call our office at [phone] and then follow the prompts to be connected to the call team.  Patient ID: Sarah Harper, female   DOB: December 08, 1940, 83 y.o.   MRN: [id number]

## 2023-09-07 NOTE — Evaluation (Signed)
Occupational Therapy Evaluation Patient Details Name: Sarah Harper MRN: [id number] DOB: 1940-09-21 Today's Date: 09/07/2023   History of Present Illness 83 y.o. female [facility] 09/05/23 after tripping and falling on R side with no LOC. Pt admitted w/ R intertrochanteric fx of R femoral neck, s/p IMN 1/16. PMHx: HTN, GERD, osteopenia, CVA, syncopal episodes, TIA, DDD   Clinical Impression   PTA patient independent with ADLs, Light IADLs and mobility without AD.  Admitted for above and presents with problem list below.  She requires mod-max assist +2 for bed mobility, min assist +2 for transfers using RW and min guard to total assist for ADLs.  Limited by dizziness at EOB, but VSS. Pt oriented and following commands, some slowed processing and decreased problem solving noted.  Family reports confusion overnight.  Educated on day/night orientation, and encouraged upright in bed today. Based on performance today, believe patient will best benefit from continued OT services acutely and after dc at inpatient setting with <3hrs/day to optimize independence, safety with ADLs and mobility.        If plan is discharge home, recommend the following: Two people to help with walking and/or transfers;Two people to help with bathing/dressing/bathroom;Assistance with cooking/housework;Direct supervision/assist for financial management;Assist for transportation;Direct supervision/assist for medications management;Help with stairs or ramp for entrance    Functional Status Assessment     Equipment Recommendations  Other (comment) (defer)    Recommendations for Other Services       Precautions / Restrictions Precautions Precautions: Fall Restrictions Weight Bearing Restrictions Per Provider Order: Yes RLE Weight Bearing Per Provider Order: Weight bearing as tolerated      Mobility Bed Mobility Overal bed mobility: Needs Assistance Bed Mobility: Supine to Sit, Sit to Supine     Supine to  sit: Mod assist, +2 for physical assistance Sit to supine: Max assist, +2 for physical assistance   General bed mobility comments: cueing for technique, assist for R LE and trunk, as well as scooting.  Requires increased time    Transfers Overall transfer level: Needs assistance Equipment used: Rolling walker (2 wheels) Transfers: Sit to/from Stand Sit to Stand: Min assist, +2 physical assistance           General transfer comment: pt pulling on RW with +2 assist to power up, stepping towards HOB with min assist +2      Balance Overall balance assessment: Needs assistance Sitting-balance support: No upper extremity supported, Feet supported Sitting balance-Leahy Scale: Fair Sitting balance - Comments: min gaurd statically, tends to lean posteriorly Postural control: Posterior lean Standing balance support: During functional activity, Bilateral upper extremity supported Standing balance-Leahy Scale: Poor Standing balance comment: relies on BUE and external support                           ADL either performed or assessed with clinical judgement   ADL Overall ADL's : Needs assistance/impaired     Grooming: Contact guard assist;Sitting           Upper Body Dressing : Contact guard assist;Sitting   Lower Body Dressing: Total assistance;+2 for physical assistance;Sit to/from stand   Toilet Transfer: Minimal assistance;+2 for physical assistance Toilet Transfer Details (indicate cue type and reason): simulated side stepping towards [facility]         Functional mobility during ADLs: Minimal assistance;Moderate assistance;+2 for physical assistance;Rolling walker (2 wheels)       Vision Baseline Vision/History: 1 Wears glasses Vision Assessment?: No  apparent visual deficits;Wears glasses for reading     Perception         Praxis         Pertinent Vitals/Pain Pain Assessment Pain Assessment: Faces Faces Pain Scale: Hurts whole lot Pain Location:  back/hip Pain Descriptors / Indicators: Aching, Discomfort, Grimacing, Guarding, Operative site guarding Pain Intervention(s): Limited activity within patient's tolerance, Monitored during session, Repositioned, Patient requesting pain meds-RN notified     Extremity/Trunk Assessment Upper Extremity Assessment Upper Extremity Assessment: Generalized weakness   Lower Extremity Assessment Lower Extremity Assessment: Defer to PT evaluation       Communication Communication Communication: Difficulty following commands/understanding Following commands: Follows one step commands consistently;Follows one step commands inconsistently;Follows multi-step commands inconsistently Cueing Techniques: Verbal cues;Tactile cues   Cognition Arousal: Alert Behavior During Therapy: Flat affect, Anxious Overall Cognitive Status: Impaired/Different from baseline Area of Impairment: Problem solving                             Problem Solving: Slow processing, Difficulty sequencing, Requires verbal cues General Comments: pt oriented and following simple commands, some slow processing and requires cueing for sequening but internally distracted by pain.  Patients family reports confusion over night.     General Comments  pt reports dizziness at EOB, BP stable    Exercises     Shoulder Instructions      Home Living Family/patient expects to be discharged to:: Private residence Living Arrangements: Spouse/significant other Available Help at Discharge: Family;Available 24 hours/day Type of Home: House Home Access: Stairs to enter [employer] of Steps: 2 Entrance Stairs-Rails: Can reach both Home Layout: One level     Bathroom Shower/Tub: Producer, television/film/video: Handicapped height     Home Equipment: Grab bars - tub/shower;Toilet riser          Prior Functioning/Environment Prior Level of Function : Independent/Modified Independent;Driving              Mobility Comments: independent ADLs Comments: independent ADLs, light IADLs, driving        OT Problem List:        OT Treatment/Interventions:      OT Goals(Current goals can be found in the care plan section) Acute Rehab OT Goals Patient Stated Goal: get better OT Goal Formulation: With patient Time For Goal Achievement: 09/21/23 Potential to Achieve Goals: Good  OT Frequency: Min 1X/week    Co-evaluation PT/OT/SLP Co-Evaluation/Treatment: Yes Reason for Co-Treatment: For patient/therapist safety;To address functional/ADL transfers PT goals addressed during session: Mobility/safety with mobility OT goals addressed during session: ADL's and self-care      AM-PAC OT "6 Clicks" Daily Activity     Outcome Measure Help from another person eating meals?: A Little Help from another person taking care of personal grooming?: A Little Help from another person toileting, which includes using toliet, bedpan, or urinal?: Total Help from another person bathing (including washing, rinsing, drying)?: A Lot Help from another person to put on and taking off regular upper body clothing?: A Little Help from another person to put on and taking off regular lower body clothing?: Total 6 Click Score: 13   End of Session Equipment Utilized During Treatment: Rolling walker (2 wheels);Gait belt Nurse Communication: Mobility status  Activity Tolerance: Patient tolerated treatment well Patient left: in bed;with bed alarm set;with call bell/phone within reach;with family/visitor present  OT Visit Diagnosis: Other abnormalities of gait and mobility (R26.89);Pain Pain - Right/Left: Right Pain -  part of body: Hip                Time: 1001-1026 OT Time Calculation (min): 25 min Charges:  OT General Charges $OT Visit: 1 Visit OT Evaluation $OT Eval Moderate Complexity: 1 Mod  Barry Brunner, OT Acute Rehabilitation Services Office [phone]   Chancy Milroy 09/07/2023, 11:26 AM

## 2023-09-07 NOTE — Discharge Instructions (Addendum)
Orthopaedic Trauma Service Discharge Instructions   General Discharge Instructions  Orthopaedic Injuries:  Right hip fracture treated with intramedullary nailing   WEIGHT BEARING STATUS: Weightbearing as tolerated using walker   RANGE OF MOTION/ACTIVITY: activity as tolerated, no range of motion restrictions at hip or knee   Bone health:  vitamin d levels look good but fracture is associated with osteoporosis   Review the following resource for additional information regarding bone health  [email or web address].com.cy  Wound Care: daily wound care as needed, see below    Discharge Wound Care Instructions  Do NOT apply any ointments, solutions or lotions to pin sites or surgical wounds.  These prevent needed drainage and even though solutions like hydrogen peroxide kill bacteria, they also damage cells lining the pin sites that help fight infection.  Applying lotions or ointments can keep the wounds moist and can cause them to breakdown and open up as well. This can increase the risk for infection. When in doubt call the office.  Surgical incisions should be dressed daily.  If any drainage is noted, use one layer of adaptic or Mepitel, then gauze and tape. Alternatively you can use a silicone foam dressing such as a mepilex dressing (this is what you have on)  NetCamper.cz [email or web address]-soto.com/?pd_rd_[email or web address]&[email or web address]  [email or web address]  These dressing supplies should be available at local medical supply stores ([employer], [facility] [facility], etc). They are not usually carried at places like [facility], [facility], [facility], etc  Once the incision is completely dry and without drainage, it may be left open to air out.  Showering may begin 36-48 hours later.  Cleaning gently  with soap and water.   Diet: as you were eating previously.  Can use over the counter stool softeners and bowel preparations, such as Miralax, to help with bowel movements.  Narcotics can be constipating.  Be sure to drink plenty of fluids  PAIN MEDICATION USE AND EXPECTATIONS  You have likely been given narcotic medications to help control your pain.  After a traumatic event that results in an fracture (broken bone) with or without surgery, it is ok to use narcotic pain medications to help control one's pain.  We understand that everyone responds to pain differently and each individual patient will be evaluated on a regular basis for the continued need for narcotic medications. Ideally, narcotic medication use should last no more than 6-8 weeks (coinciding with fracture healing).   As a patient it is your responsibility as well to monitor narcotic medication use and report the amount and frequency you use these medications when you come to your office visit.   We would also advise that if you are using narcotic medications, you should take a dose prior to therapy to maximize you participation.  IF YOU ARE ON NARCOTIC MEDICATIONS IT IS NOT PERMISSIBLE TO OPERATE A MOTOR VEHICLE (MOTORCYCLE/CAR/TRUCK/MOPED) OR HEAVY MACHINERY DO NOT MIX NARCOTICS WITH OTHER CNS (CENTRAL NERVOUS SYSTEM) DEPRESSANTS SUCH AS ALCOHOL   POST-OPERATIVE OPIOID TAPER INSTRUCTIONS: It is important to wean off of your opioid medication as soon as possible. If you do not need pain medication after your surgery it is ok to stop day one. Opioids include: Codeine, Hydrocodone(Norco, Vicodin), Oxycodone(Percocet, oxycontin) and hydromorphone amongst others.  Long term and even short term use of opiods can cause: Increased pain response Dependence Constipation Depression Respiratory depression And more.  Withdrawal symptoms can include Flu like symptoms Nausea, vomiting And more Techniques to manage these  symptoms Hydrate well Eat regular healthy meals Stay active Use  relaxation techniques(deep breathing, meditating, yoga) Do Not substitute Alcohol to help with tapering If you have been on opioids for less than two weeks and do not have pain than it is ok to stop all together.  Plan to wean off of opioids This plan should start within one week post op of your fracture surgery  Maintain the same interval or time between taking each dose and first decrease the dose.  Cut the total daily intake of opioids by one tablet each day Next start to increase the time between doses. The last dose that should be eliminated is the evening dose.    STOP SMOKING OR USING NICOTINE PRODUCTS!!!!  As discussed nicotine severely impairs your body's ability to heal surgical and traumatic wounds but also impairs bone healing.  Wounds and bone heal by forming microscopic blood vessels (angiogenesis) and nicotine is a vasoconstrictor (essentially, shrinks blood vessels).  Therefore, if vasoconstriction occurs to these microscopic blood vessels they essentially disappear and are unable to deliver necessary nutrients to the healing tissue.  This is one modifiable factor that you can do to dramatically increase your chances of healing your injury.    (This means no smoking, no nicotine gum, patches, etc)  DO NOT USE NONSTEROIDAL ANTI-INFLAMMATORY DRUGS (NSAID'S)  Using products such as Advil (ibuprofen), Aleve (naproxen), Motrin (ibuprofen) for additional pain control during fracture healing can delay and/or prevent the healing response.  If you would like to take over the counter (OTC) medication, Tylenol (acetaminophen) is ok.  However, some narcotic medications that are given for pain control contain acetaminophen as well. Therefore, you should not exceed more than 4000 mg of tylenol in a day if you do not have liver disease.  Also note that there are may OTC medicines, such as cold medicines and allergy medicines that my  contain tylenol as well.  If you have any questions about medications and/or interactions please ask your doctor/PA or your pharmacist.      ICE AND ELEVATE INJURED/OPERATIVE EXTREMITY  Using ice and elevating the injured extremity above your heart can help with swelling and pain control.  Icing in a pulsatile fashion, such as 20 minutes on and 20 minutes off, can be followed.    Do not place ice directly on skin. Make sure there is a barrier between to skin and the ice pack.    Using frozen items such as frozen peas works well as the conform nicely to the are that needs to be iced.  USE AN ACE WRAP OR TED HOSE FOR SWELLING CONTROL  In addition to icing and elevation, Ace wraps or TED hose are used to help limit and resolve swelling.  It is recommended to use Ace wraps or TED hose until you are informed to stop.    When using Ace Wraps start the wrapping distally (farthest away from the body) and wrap proximally (closer to the body)   Example: If you had surgery on your leg or thing and you do not have a splint on, start the ace wrap at the toes and work your way up to the thigh        If you had surgery on your upper extremity and do not have a splint on, start the ace wrap at your fingers and work your way up to the upper arm  IF YOU ARE IN A SPLINT OR CAST DO NOT REMOVE IT FOR ANY REASON   If your splint gets wet for any reason please contact the  office immediately. You may shower in your splint or cast as long as you keep it dry.  This can be done by wrapping in a cast cover or garbage back (or similar)  Do Not stick any thing down your splint or cast such as pencils, money, or hangers to try and scratch yourself with.  If you feel itchy take benadryl as prescribed on the bottle for itching  IF YOU ARE IN A CAM BOOT (BLACK BOOT)  You may remove boot periodically. Perform daily dressing changes as noted below.  Wash the liner of the boot regularly and wear a sock when wearing the boot. It is  recommended that you sleep in the boot until told otherwise    Call office for the following: Temperature greater than 101F Persistent nausea and vomiting Severe uncontrolled pain Redness, tenderness, or signs of infection (pain, swelling, redness, odor or green/yellow discharge around the site) Difficulty breathing, headache or visual disturbances Hives Persistent dizziness or light-headedness Extreme fatigue Any other questions or concerns you may have after discharge  In an emergency, call 911 or go to an Emergency Department at a nearby hospital  HELPFUL INFORMATION  If you had a block, it will wear off between 8-24 hrs postop typically.  This is period when your pain may go from nearly zero to the pain you would have had postop without the block.  This is an abrupt transition but nothing dangerous is happening.  You may take an extra dose of narcotic when this happens.  You should wean off your narcotic medicines as soon as you are able.  Most patients will be off or using minimal narcotics before their first postop appointment.   We suggest you use the pain medication the first night prior to going to bed, in order to ease any pain when the anesthesia wears off. You should avoid taking pain medications on an empty stomach as it will make you nauseous.  Do not drink alcoholic beverages or take illicit drugs when taking pain medications.  In most states it is against the law to drive while you are in a splint or sling.  And certainly against the law to drive while taking narcotics.  You may return to work/school in the next couple of days when you feel up to it.   Pain medication may make you constipated.  Below are a few solutions to try in this order: Decrease the amount of pain medication if you aren't having pain. Drink lots of decaffeinated fluids. Drink prune juice and/or each dried prunes  If the first 3 don't work start with additional solutions Take Colace - an  over-the-counter stool softener Take Senokot - an over-the-counter laxative Take Miralax - a stronger over-the-counter laxative     CALL THE OFFICE WITH ANY QUESTIONS OR CONCERNS: [phone]   VISIT OUR WEBSITE FOR ADDITIONAL INFORMATION: [email or web address]

## 2023-09-07 NOTE — Plan of Care (Signed)
  Problem: Education: Goal: Knowledge of General Education information will improve Description: Including pain rating scale, medication(s)/side effects and non-pharmacologic comfort measures Outcome: Not Progressing   Problem: Health Behavior/Discharge Planning: Goal: Ability to manage health-related needs will improve Outcome: Not Progressing   Problem: Clinical Measurements: Goal: Ability to maintain clinical measurements within normal limits will improve Outcome: Not Progressing Goal: Will remain free from infection Outcome: Not Progressing Goal: Diagnostic test results will improve Outcome: Not Progressing Goal: Respiratory complications will improve Outcome: Not Progressing Goal: Cardiovascular complication will be avoided Outcome: Not Progressing   Problem: Activity: Goal: Risk for activity intolerance will decrease Outcome: Not Progressing   Problem: Nutrition: Goal: Adequate nutrition will be maintained Outcome: Not Progressing   Problem: Coping: Goal: Level of anxiety will decrease Outcome: Not Progressing   Problem: Elimination: Goal: Will not experience complications related to bowel motility Outcome: Not Progressing Goal: Will not experience complications related to urinary retention Outcome: Not Progressing   Problem: Pain Managment: Goal: General experience of comfort will improve and/or be controlled Outcome: Not Progressing   Problem: Safety: Goal: Ability to remain free from injury will improve Outcome: Not Progressing   Problem: Skin Integrity: Goal: Risk for impaired skin integrity will decrease Outcome: Not Progressing   Problem: Education: Goal: Verbalization of understanding the information provided (i.e., activity precautions, restrictions, etc) will improve Outcome: Not Progressing Goal: Individualized Educational Video(s) Outcome: Not Progressing   Problem: Activity: Goal: Ability to ambulate and perform ADLs will improve Outcome:  Not Progressing   Problem: Clinical Measurements: Goal: Postoperative complications will be avoided or minimized Outcome: Not Progressing   Problem: Self-Concept: Goal: Ability to maintain and perform role responsibilities to the fullest extent possible will improve Outcome: Not Progressing   Problem: Pain Management: Goal: Pain level will decrease Outcome: Not Progressing

## 2023-09-07 NOTE — Evaluation (Signed)
Physical Therapy Evaluation  Patient Details Name: Sarah Harper MRN: [id number] DOB: March 14, 1941 Today's Date: 09/07/2023  History of Present Illness  Pt is an 83 y.o. female who presents to [facility] 09/05/23 after tripping and falling on R side with no LOC. Pt admitted w/ R intertrochanteric fx of R femoral neck, s/p IMN 09/06/23. PMH significant for HTN, osteopenia, CVA, syncopal episodes, TIA, DDD.   Clinical Impression  Pt admitted with above diagnosis. Pt currently with functional limitations due to the deficits listed below (see PT Problem List). At the time of PT eval pt was able to perform transfers and pre-gait activity with up to +2 mod-max assist and RW for support. Daughter present during session and reports that at baseline pt's tolerance for functional activity is decreased, and pt's husband is not able to provide physical assistance at d/c. Pt is hopeful for return home at d/c however based on performance today recommend post-acute rehab <3 hours/day to maximize functional independence and facilitate safe return home with family support. Pt will benefit from acute skilled PT to increase their independence and safety with mobility to allow discharge.           If plan is discharge home, recommend the following: Two people to help with walking and/or transfers;Two people to help with bathing/dressing/bathroom;Assistance with cooking/housework;Assist for transportation;Help with stairs or ramp for entrance;Supervision due to cognitive status   Can travel by private vehicle   No    Equipment Recommendations Rolling walker (2 wheels);BSC/3in1  Recommendations for Other Services       Functional Status Assessment Patient has had a recent decline in their functional status and demonstrates the ability to make significant improvements in function in a reasonable and predictable amount of time.     Precautions / Restrictions Precautions Precautions: Fall Restrictions Weight Bearing  Restrictions Per Provider Order: Yes RLE Weight Bearing Per Provider Order: Weight bearing as tolerated      Mobility  Bed Mobility Overal bed mobility: Needs Assistance Bed Mobility: Supine to Sit, Sit to Supine     Supine to sit: Mod assist, +2 for physical assistance Sit to supine: Max assist, +2 for physical assistance   General bed mobility comments: Multimodal cues for technique. Assist for R LE and trunk, as well as scooting.  Requires increased time. Return to be required +2 max assist due to dizziness.    Transfers Overall transfer level: Needs assistance Equipment used: Rolling walker (2 wheels) Transfers: Sit to/from Stand Sit to Stand: Min assist, +2 physical assistance           General transfer comment: Pt pulling from walker to stand with +2 assist from therapists for power up and to gain/maintain standing balance.    Ambulation/Gait             Pre-gait activities: Pt took several side steps at EOB to reposition prior to returning to supine. Therapists facilitated weight shift, and assisted with RLE advancement out towards the R. Step-by-step VC's throughout.    Stairs            Wheelchair Mobility     Tilt Bed    Modified Rankin (Stroke Patients Only)       Balance Overall balance assessment: Needs assistance Sitting-balance support: No upper extremity supported, Feet supported Sitting balance-Leahy Scale: Fair Sitting balance - Comments: min gaurd statically, tends to lean posteriorly Postural control: Posterior lean Standing balance support: During functional activity, Bilateral upper extremity supported Standing balance-Leahy Scale: Poor Standing balance comment:  relies on BUE and external support                             Pertinent Vitals/Pain Pain Assessment Pain Assessment: Faces Faces Pain Scale: Hurts whole lot Pain Location: back/hip Pain Descriptors / Indicators: Aching, Discomfort, Grimacing, Guarding,  Operative site guarding Pain Intervention(s): Limited activity within patient's tolerance, Monitored during session, Repositioned    Home Living Family/patient expects to be discharged to:: Private residence Living Arrangements: Spouse/significant other Available Help at Discharge: Family;Available 24 hours/day Type of Home: House Home Access: Stairs to enter Entrance Stairs-Rails: Can reach both Entrance Stairs-Number of Steps: 2   Home Layout: One level Home Equipment: Grab bars - tub/shower;Toilet riser      Prior Function Prior Level of Function : Independent/Modified Independent;Driving             Mobility Comments: independent ADLs Comments: independent ADLs, light IADLs, driving     Extremity/Trunk Assessment   Upper Extremity Assessment Upper Extremity Assessment: Defer to OT evaluation    Lower Extremity Assessment Lower Extremity Assessment: RLE deficits/detail RLE Deficits / Details: Acute pain, decreased strength and AROM consistent with pre-op diagnosis and subsequent surgery.    Cervical / Trunk Assessment Cervical / Trunk Assessment: Kyphotic (mild)  Communication   Communication Communication: Difficulty following commands/understanding Following commands: Follows one step commands consistently Cueing Techniques: Verbal cues;Gestural cues  Cognition Arousal: Alert Behavior During Therapy: Flat affect, Anxious Overall Cognitive Status: Impaired/Different from baseline Area of Impairment: Problem solving                             Problem Solving: Slow processing, Difficulty sequencing, Requires verbal cues General Comments: pt oriented and following simple commands, some slow processing and requires cueing for sequencing but internally distracted by pain.  Patients family reports confusion over night.        General Comments General comments (skin integrity, edema, etc.): pt reports dizziness at EOB, BP stable at 146/59     Exercises     Assessment/Plan    PT Assessment Patient needs continued PT services  PT Problem List Decreased strength;Decreased range of motion;Decreased activity tolerance;Decreased balance;Decreased mobility;Decreased knowledge of use of DME;Decreased safety awareness;Decreased knowledge of precautions;Pain       PT Treatment Interventions Gait training;DME instruction;Functional mobility training;Stair training;Therapeutic activities;Therapeutic exercise;Balance training;Patient/family education    PT Goals (Current goals can be found in the Care Plan section)  Acute Rehab PT Goals Patient Stated Goal: Home at d/c PT Goal Formulation: With patient/family Time For Goal Achievement: 09/21/23 Potential to Achieve Goals: Good    Frequency Min 1X/week     Co-evaluation PT/OT/SLP Co-Evaluation/Treatment: Yes Reason for Co-Treatment: For patient/therapist safety;To address functional/ADL transfers PT goals addressed during session: Mobility/safety with mobility;Balance;Proper use of DME OT goals addressed during session: ADL's and self-care       AM-PAC PT "6 Clicks" Mobility  Outcome Measure Help needed turning from your back to your side while in a flat bed without using bedrails?: A Lot Help needed moving from lying on your back to sitting on the side of a flat bed without using bedrails?: Total Help needed moving to and from a bed to a chair (including a wheelchair)?: Total Help needed standing up from a chair using your arms (e.g., wheelchair or bedside chair)?: Total Help needed to walk in hospital room?: Total Help needed climbing 3-5 steps with a  railing? : Total 6 Click Score: 7    End of Session Equipment Utilized During Treatment: Gait belt Activity Tolerance: Patient tolerated treatment well Patient left: in bed;with call bell/phone within reach;with bed alarm set;with family/visitor present Nurse Communication: Mobility status PT Visit Diagnosis: Unsteadiness  on feet (R26.81);Pain Pain - Right/Left: Right Pain - part of body: Hip (and back)    Time: 0981-1914 PT Time Calculation (min) (ACUTE ONLY): 24 min   Charges:   PT Evaluation $PT Eval Moderate Complexity: 1 Mod   PT General Charges $$ ACUTE PT VISIT: 1 Visit         Conni Slipper, PT, DPT Acute Rehabilitation Services Secure Chat Preferred Office: [phone]   Marylynn Pearson 09/07/2023, 12:57 PM

## 2023-09-07 NOTE — Plan of Care (Signed)
Problem: Education: Goal: Knowledge of General Education information will improve Description: Including pain rating scale, medication(s)/side effects and non-pharmacologic comfort measures 09/07/2023 0450 by Debbrah Alar, RN Outcome: Not Progressing 09/07/2023 0449 by Debbrah Alar, RN Outcome: Not Progressing   Problem: Health Behavior/Discharge Planning: Goal: Ability to manage health-related needs will improve 09/07/2023 0450 by Debbrah Alar, RN Outcome: Not Progressing 09/07/2023 0449 by Debbrah Alar, RN Outcome: Not Progressing   Problem: Clinical Measurements: Goal: Ability to maintain clinical measurements within normal limits will improve 09/07/2023 0450 by Debbrah Alar, RN Outcome: Not Progressing 09/07/2023 0449 by Debbrah Alar, RN Outcome: Not Progressing Goal: Will remain free from infection 09/07/2023 0450 by Debbrah Alar, RN Outcome: Not Progressing 09/07/2023 0449 by Debbrah Alar, RN Outcome: Not Progressing Goal: Diagnostic test results will improve 09/07/2023 0450 by Debbrah Alar, RN Outcome: Not Progressing 09/07/2023 0449 by Debbrah Alar, RN Outcome: Not Progressing Goal: Respiratory complications will improve 09/07/2023 0450 by Debbrah Alar, RN Outcome: Not Progressing 09/07/2023 0449 by Velia Meyer D, RN Outcome: Not Progressing Goal: Cardiovascular complication will be avoided 09/07/2023 0450 by Debbrah Alar, RN Outcome: Not Progressing 09/07/2023 0449 by Debbrah Alar, RN Outcome: Not Progressing   Problem: Activity: Goal: Risk for activity intolerance will decrease 09/07/2023 0450 by Debbrah Alar, RN Outcome: Not Progressing 09/07/2023 0449 by Debbrah Alar, RN Outcome: Not Progressing   Problem: Nutrition: Goal: Adequate nutrition will be maintained 09/07/2023 0450 by Debbrah Alar, RN Outcome: Not Progressing 09/07/2023 0449 by Velia Meyer D,  RN Outcome: Not Progressing   Problem: Coping: Goal: Level of anxiety will decrease 09/07/2023 0450 by Debbrah Alar, RN Outcome: Not Progressing 09/07/2023 0449 by Debbrah Alar, RN Outcome: Not Progressing   Problem: Elimination: Goal: Will not experience complications related to bowel motility 09/07/2023 0450 by Debbrah Alar, RN Outcome: Not Progressing 09/07/2023 0449 by Debbrah Alar, RN Outcome: Not Progressing Goal: Will not experience complications related to urinary retention 09/07/2023 0450 by Debbrah Alar, RN Outcome: Not Progressing 09/07/2023 0449 by Debbrah Alar, RN Outcome: Not Progressing   Problem: Pain Managment: Goal: General experience of comfort will improve and/or be controlled 09/07/2023 0450 by Debbrah Alar, RN Outcome: Not Progressing 09/07/2023 0449 by Debbrah Alar, RN Outcome: Not Progressing   Problem: Safety: Goal: Ability to remain free from injury will improve 09/07/2023 0450 by Debbrah Alar, RN Outcome: Not Progressing 09/07/2023 0449 by Debbrah Alar, RN Outcome: Not Progressing   Problem: Skin Integrity: Goal: Risk for impaired skin integrity will decrease 09/07/2023 0450 by Debbrah Alar, RN Outcome: Not Progressing 09/07/2023 0449 by Debbrah Alar, RN Outcome: Not Progressing   Problem: Education: Goal: Verbalization of understanding the information provided (i.e., activity precautions, restrictions, etc) will improve 09/07/2023 0450 by Debbrah Alar, RN Outcome: Not Progressing 09/07/2023 0449 by Debbrah Alar, RN Outcome: Not Progressing Goal: Individualized Educational Video(s) 09/07/2023 0450 by Debbrah Alar, RN Outcome: Not Progressing 09/07/2023 0449 by Debbrah Alar, RN Outcome: Not Progressing   Problem: Activity: Goal: Ability to ambulate and perform ADLs will improve 09/07/2023 0450 by Debbrah Alar, RN Outcome: Not Progressing 09/07/2023  0449 by Debbrah Alar, RN Outcome: Not Progressing   Problem: Clinical Measurements: Goal: Postoperative complications will be avoided or minimized 09/07/2023 0450 by Debbrah Alar, RN Outcome: Not Progressing 09/07/2023 0449 by Debbrah Alar, RN Outcome: Not Progressing  Problem: Self-Concept: Goal: Ability to maintain and perform role responsibilities to the fullest extent possible will improve 09/07/2023 0450 by Debbrah Alar, RN Outcome: Not Progressing 09/07/2023 0449 by Velia Meyer D, RN Outcome: Not Progressing   Problem: Pain Management: Goal: Pain level will decrease 09/07/2023 0450 by Debbrah Alar, RN Outcome: Not Progressing 09/07/2023 0449 by Debbrah Alar, RN Outcome: Not Progressing

## 2023-09-08 DIAGNOSIS — E871 Hypo-osmolality and hyponatremia: Secondary | ICD-10-CM | POA: Diagnosis not present

## 2023-09-08 DIAGNOSIS — S72001A Fracture of unspecified part of neck of right femur, initial encounter for closed fracture: Secondary | ICD-10-CM | POA: Diagnosis not present

## 2023-09-08 LAB — BASIC METABOLIC PANEL
Anion gap: 7 (ref 5–15)
BUN: 15 mg/dL (ref 8–23)
CO2: 24 mmol/L (ref 22–32)
Calcium: 8.1 mg/dL — ABNORMAL LOW (ref 8.9–10.3)
Chloride: 92 mmol/L — ABNORMAL LOW (ref 98–111)
Creatinine, Ser: 0.64 mg/dL (ref 0.44–1.00)
GFR, Estimated: >60 mL/min (ref >60)
Glucose, Bld: 106 mg/dL — ABNORMAL HIGH (ref 70–99)
Potassium: 3.9 mmol/L (ref 3.5–5.1)
Sodium: 123 mmol/L — ABNORMAL LOW (ref 135–145)

## 2023-09-08 LAB — CBC
HCT: 28 % — ABNORMAL LOW (ref 36.0–46.0)
Hemoglobin: 9.7 g/dL — ABNORMAL LOW (ref 12.0–15.0)
MCH: 29.3 pg (ref 26.0–34.0)
MCHC: 34.6 g/dL (ref 30.0–36.0)
MCV: 84.6 fL (ref 80.0–100.0)
Platelets: 133 10*3/uL — ABNORMAL LOW (ref 150–400)
RBC: 3.31 MIL/uL — ABNORMAL LOW (ref 3.87–5.11)
RDW: 13.2 % (ref 11.5–15.5)
WBC: 5.4 10*3/uL (ref 4.0–10.5)
nRBC: 0 % (ref 0.0–0.2)

## 2023-09-08 LAB — MAGNESIUM: Magnesium: 2 mg/dL (ref 1.7–2.4)

## 2023-09-08 LAB — SARS CORONAVIRUS 2 BY RT PCR: SARS Coronavirus 2 by RT PCR: POSITIVE — AB

## 2023-09-08 MED ORDER — SODIUM CHLORIDE 0.9 % IV SOLN
INTRAVENOUS | Status: DC
Start: 2023-09-08 — End: 2023-09-09

## 2023-09-08 MED ORDER — NIRMATRELVIR/RITONAVIR (PAXLOVID)TABLET
3.0000 | ORAL_TABLET | Freq: Two times a day (BID) | ORAL | Status: AC
  Administered 2023-09-08 – 2023-09-13 (×10): 3 via ORAL
  Filled 2023-09-08: qty 30

## 2023-09-08 NOTE — Progress Notes (Addendum)
PROGRESS NOTE    Sarah Harper  TFT:[id number] DOB: 28-Nov-1940 DOA: 09/05/2023 PCP: Judy Pimple, MD    Brief Narrative:   Sarah Harper is a 83 y.o. female with past medical history significant for HTN, hypothyroidism, dyslipidemia, GERD, osteopenia, OSA on CPAP who presented to [facility] ED on 1/15 with complaint of right hip pain.  Patient reports she tripped and fell onto her right side without loss of consciousness.  She was unable to ambulate or get up after event.  EMS was called to her home where there was noticeable shortening and rotation of the right leg.  Patient denied chest pain, no dizziness, no shortness of breath, no palpitations.  Patient was transported to the ED for further evaluation and management.  In the ED, temperature 99.3 F, HR 84, RR 18, BP 166/105, SpO2 95% on room air.  WBC of 7.2, hemoglobin 12.9, platelet count 185.  Sodium 126, potassium 3.2, chloride 93, CO2 21, glucose 106, BUN 9, creatinine 0.74.  TSH 1.538.  Chest x-ray with no active cardiopulmonary disease process.  X-ray right pelvis with acute impacted, angulated, displaced intertrochanteric fracture proximal right femur.  Right femur x-ray with intertrochanteric fracture right femoral neck.  Orthopedics was consulted.  TRH consulted for admission for further evaluation management of right femur fracture.  Assessment & Plan:    Right displaced, angulated, impacted intertrochanteric fracture proximal right femur Patient presenting to the ED with acute right hip pain, inability to ambulate following mechanical fall at home.  Denies loss of consciousness or striking her head.  Imaging notable for acute impacted, angulated displaced intertrochanteric fracture of proximal right femur.  Orthopedics was consulted and patient underwent intramedullary nailing of the right hip on 09/06/2023 by Dr. Carola Frost. -- Orthopedics following, appreciate assistance -- vitamin D 25-hydroxy level 44.40 -- WBAT RLE -- Lovenox for  DVT prophylaxis -- Percocet 1-2 tabs p.o. q4h PRN moderate pain  -- Morphine 1-2 mg IV q2h PRN severe pain -- Robaxin 500 mg p.o. q6h PRN muscle spasms -- PT/OT evaluation: Recommend SNF placement; TOC consulted -- Outpatient follow-up with orthopedics 2 weeks for suture removal and wound check  Acute postoperative blood loss anemia -- Hgb 12.9>>11.3>9.7 -- Transfuse for hemoglobin less than 7.0 -- Repeat CBC in a.m.  Hyponatremia Etiology likely secondary to SIADH versus recent use of Bactrim.  Serum osmolality 272, urine osmolality 319, urine sodium 80. -- Na 126>126>127>123 -- NS at 100 mL/h x 1 day -- Repeat BMP in a.m.  Hypokalemia Repleted.  Essential hypertension -- Amlodipine 2.5 mg p.o. daily -- Metoprolol tartrate 12.5 mg p.o. twice daily  Hypothyroidism TSH 1.53, within normal limits. -- Levothyroxine 75 mcg p.o. daily  History of dyslipidemia Currently not on medications prior to admission.  GERD -- Protonix 40 mg p.o. daily -- Pepcid 40 mg p.o. daily.  Heartburn/indigestion -- Carafate 1 g p.o. 3 times daily as needed esophagitis  Insomnia -- Melatonin 3 minutes p.o. nightly -- Trazodone 25 m p.o. nightly   DVT prophylaxis: SCDs Start: 09/06/23 1107 enoxaparin (LOVENOX) injection 40 mg Start: 09/05/23 1200    Code Status: Full Code Family Communication: Updated sister present at bedside this morning  Disposition Plan:  Level of care: Med-Surg Status is: Inpatient Remains inpatient appropriate because: Pending SNF placement    Consultants:  Orthopedics, Dr. Carola Frost  Procedures:  Right femur intramedullary nailing, Dr. Carola Frost 1/16  Antimicrobials:  Perioperative cefazolin   Subjective: Seen examined bedside, resting calmly.  Lying in bed.  Eating  breakfast.  Sister present at bedside.  Complaining of some soreness to right leg.  No other specific questions, concerns or complaints at this time.  Denies headache, no vision changes, no dizziness,  no chest pain, no shortness of breath, no abdominal pain, no fever/chills, no diarrhea, no focal weakness, no paresthesias.  No acute events overnight per nursing staff.  Awaiting SNF placement.  Objective: Vitals:   09/07/23 1959 09/08/23 0442 09/08/23 0502 09/08/23 0755  BP: 135/64 (!) 143/69  (!) 147/72  Pulse: 80 72  78  Resp: 16 16  17   Temp: 98.6 F (37 C) 98.8 F (37.1 C)    TempSrc: Oral     SpO2: 95% 92%  96%  Weight:   62.6 kg   Height:        Intake/Output Summary (Last 24 hours) at 09/08/2023 1335 Last data filed at 09/08/2023 0900 Gross per 24 hour  Intake 240 ml  Output 600 ml  Net -360 ml   Filed Weights   09/06/23 0717 09/08/23 0502  Weight: 59 kg 62.6 kg    Examination:  Physical Exam: GEN: NAD, alert, oriented to person/place/time HEENT: NCAT, PERRL, EOMI, sclera clear, MMM PULM: CTAB w/o wheezes/crackles, normal respiratory effort, on room air CV: RRR w/o M/G/R GI: abd soft, NTND, NABS, no R/G/M MSK: Right hip with surgical dressings in place, clean/dry/intact without surrounding erythema/ecchymosis, no peripheral edema, muscle strength globally intact 5/5 bilateral upper/lower extremities, neurovascularly intact NEURO: No focal neurological deficits PSYCH: normal mood/affect Integumentary: No concerning rashes/lesions/wounds noted on exposed skin surfaces.    Data Reviewed: I have personally reviewed following labs and imaging studies  CBC: Recent Labs  Lab 09/05/23 0805 09/05/23 1255 09/06/23 0527 09/07/23 0642 09/08/23 0450  WBC 7.2 8.4 5.6 7.4 5.4  NEUTROABS 5.8  --   --   --   --   HGB 12.9 12.5 12.6 11.3* 9.7*  HCT 38.4 37.6 36.9 33.3* 28.0*  MCV 86.3 87.0 85.6 84.7 84.6  PLT 185 201 188 156 133*   Basic Metabolic Panel: Recent Labs  Lab 09/05/23 0805 09/05/23 1255 09/06/23 0527 09/07/23 0642 09/08/23 0450  NA 126*  --  126* 127* 123*  K 3.2*  --  4.1 4.2 3.9  CL 93*  --  94* 95* 92*  CO2 21*  --  23 24 24   GLUCOSE 106*   --  123* 114* 106*  BUN 9  --  13 12 15   CREATININE 0.74 0.78 0.77 0.74 0.64  CALCIUM 8.8* 8.9 9.0 8.7* 8.1*  MG  --  1.7  --  1.6* 2.0   GFR: Estimated Creatinine Clearance: 46.8 mL/min (by C-G formula based on SCr of 0.64 mg/dL). Liver Function Tests: Recent Labs  Lab 09/05/23 1255  ALBUMIN 3.8   No results for input(s): "LIPASE", "AMYLASE" in the last 168 hours. No results for input(s): "AMMONIA" in the last 168 hours. Coagulation Profile: No results for input(s): "INR", "PROTIME" in the last 168 hours. Cardiac Enzymes: No results for input(s): "CKTOTAL", "CKMB", "CKMBINDEX", "TROPONINI" in the last 168 hours. BNP (last 3 results) No results for input(s): "PROBNP" in the last 8760 hours. HbA1C: No results for input(s): "HGBA1C" in the last 72 hours. CBG: No results for input(s): "GLUCAP" in the last 168 hours. Lipid Profile: No results for input(s): "CHOL", "HDL", "LDLCALC", "TRIG", "CHOLHDL", "LDLDIRECT" in the last 72 hours. Thyroid Function Tests: No results for input(s): "TSH", "T4TOTAL", "FREET4", "T3FREE", "THYROIDAB" in the last 72 hours.  Anemia Panel: No  results for input(s): "VITAMINB12", "FOLATE", "FERRITIN", "TIBC", "IRON", "RETICCTPCT" in the last 72 hours. Sepsis Labs: No results for input(s): "PROCALCITON", "LATICACIDVEN" in the last 168 hours.  Recent Results (from the past 240 hours)  Urine Culture     Status: Abnormal   Collection Time: 09/03/23 10:26 AM   Specimen: Urine  Result Value Ref Range Status   MICRO NUMBER: [id number]  Final   SPECIMEN QUALITY: Adequate  Final   Sample Source URINE  Final   STATUS: FINAL  Final   ISOLATE 1: Escherichia coli (A)  Final    Comment: 50,000-100,000 CFU/mL of Escherichia coli      Susceptibility   Escherichia coli - URINE CULTURE, REFLEX    AMOX/CLAVULANIC 8 Sensitive     AMPICILLIN >=32 Resistant     AMPICILLIN/SULBACTAM >=32 Resistant     CEFAZOLIN* <=4 Not Reportable      * For infections other than  uncomplicated UTI caused by E. coli, K. pneumoniae or P. mirabilis: Cefazolin is resistant if MIC > or = 8 mcg/mL. (Distinguishing susceptible versus intermediate for isolates with MIC < or = 4 mcg/mL requires additional testing.) For uncomplicated UTI caused by E. coli, K. pneumoniae or P. mirabilis: Cefazolin is susceptible if MIC <32 mcg/mL and predicts susceptible to the oral agents cefaclor, cefdinir, cefpodoxime, cefprozil, cefuroxime, cephalexin and loracarbef.     CEFTAZIDIME <=1 Sensitive     CEFEPIME <=1 Sensitive     CEFTRIAXONE <=1 Sensitive     CIPROFLOXACIN <=0.25 Sensitive     LEVOFLOXACIN <=0.12 Sensitive     GENTAMICIN <=1 Sensitive     IMIPENEM <=0.25 Sensitive     NITROFURANTOIN <=16 Sensitive     PIP/TAZO <=4 Sensitive     TOBRAMYCIN <=1 Sensitive     TRIMETH/SULFA* <=20 Sensitive      * For infections other than uncomplicated UTI caused by E. coli, K. pneumoniae or P. mirabilis: Cefazolin is resistant if MIC > or = 8 mcg/mL. (Distinguishing susceptible versus intermediate for isolates with MIC < or = 4 mcg/mL requires additional testing.) For uncomplicated UTI caused by E. coli, K. pneumoniae or P. mirabilis: Cefazolin is susceptible if MIC <32 mcg/mL and predicts susceptible to the oral agents cefaclor, cefdinir, cefpodoxime, cefprozil, cefuroxime, cephalexin and loracarbef. Legend: S = Susceptible  I = Intermediate R = Resistant  NS = Not susceptible SDD = Susceptible Dose Dependent * = Not Tested  NR = Not Reported **NN = See Therapy Comments   Surgical pcr screen     Status: None   Collection Time: 09/05/23  3:41 PM   Specimen: Nasal Mucosa; Nasal Swab  Result Value Ref Range Status   MRSA, PCR NEGATIVE NEGATIVE Final   Staphylococcus aureus NEGATIVE NEGATIVE Final    Comment: (NOTE) The Xpert SA Assay (FDA approved for NASAL specimens in patients 37 years of age and older), is one component of a comprehensive surveillance program. It is  not intended to diagnose infection nor to guide or monitor treatment. Performed at [facility], [address], [place], [place] [address]          Radiology Studies: No results found.       Scheduled Meds:  amLODipine  2.5 mg Oral Daily   docusate sodium  100 mg Oral BID   enoxaparin (LOVENOX) injection  40 mg Subcutaneous Q24H   levothyroxine  75 mcg Oral Daily   melatonin  3 mg Oral QHS   metoprolol tartrate  12.5 mg Oral BID  pantoprazole  40 mg Oral Daily   Ensure Max Protein  11 oz Oral BID   traZODone  25 mg Oral QHS   Continuous Infusions:  sodium chloride 100 mL/hr at 09/08/23 1042      LOS: 3 days    Time spent: 53 minutes spent on chart review, discussion with nursing staff, consultants, updating family and interview/physical exam; more than 50% of that time was spent in counseling and/or coordination of care.    Alvira Philips [place], DO [facility] Available via Epic secure chat 7am-7pm After these hours, please refer to coverage provider listed on [email or web address] 09/08/2023, 1:35 PM

## 2023-09-08 NOTE — NC FL2 (Signed)
[place] MEDICAID FL2 LEVEL OF CARE FORM     IDENTIFICATION  Patient Name: Sarah Harper Birthdate: 1940-09-02 Sex: female Admission Date (Current Location): 09/05/2023  [facility] and [place] Number:  Chiropodist and Address:  [facility] [place], [address] [place], [place] [address]      Provider Number: [id number]  Attending Physician Name and Address:  [place], Alvira Philips, DO  Relative Name and Phone Number:       Current Level of Care: Hospital Recommended Level of Care: Skilled Nursing Facility Prior Approval Number:    Date Approved/Denied:   PASRR Number: [id number]  Discharge Plan: SNF    Current Diagnoses: Patient Active Problem List   Diagnosis Date Noted   Closed right hip fracture (HCC) 09/05/2023   Abnormal urinalysis 09/02/2023   Acute cystitis 08/21/2023   Current use of proton pump inhibitor 03/13/2023   Traumatic ecchymosis of right foot 02/13/2023   Pain in joint, lower leg 01/30/2023   Senile purpura (HCC) 12/26/2022   Skin tear of right lower leg without complication 12/26/2022   Dysuria 10/21/2021   Elevated alkaline phosphatase level 03/11/2021   Palpitations 11/10/2020   Migraine    Hypertension, essential    H/O: CVA (cerebrovascular accident) 11/08/2020   Headache 09/26/2019   Fall at home 06/02/2019   Colon cancer screening 02/05/2018   Degenerative joint disease (DJD) of lumbar spine 05/02/2017   Internal hemorrhoids 05/02/2017   Generalized osteoarthritis of hand 12/11/2016   GAD (generalized anxiety disorder) 12/11/2016   Estrogen deficiency 05/11/2016   Encounter for screening mammogram for breast cancer 05/11/2016   OSA (obstructive sleep apnea) 03/06/2016   Fatigue 01/25/2016   Left knee pain 10/18/2015   Snoring 10/18/2015   Routine general medical examination at a health care facility 02/28/2015   Encounter for Medicare annual wellness exam 02/06/2013   Hyperlipidemia 08/27/2012   Irregular  heart beat 08/05/2012   Encounter for gynecological examination 12/20/2010   Osteopenia 12/17/2009   BACK PAIN, LUMBAR 07/02/2009   IRRITABLE BOWEL SYNDROME 04/02/2009   Asymptomatic postmenopausal status 12/10/2008   Hypothyroidism 12/05/2007   Meniere's disease 12/05/2007   GERD 12/05/2007    Orientation RESPIRATION BLADDER Height & Weight     Self, Time, Situation, Place  Normal Continent Weight: 138 lb 0.1 oz (62.6 kg) Height:  5\' 4"  (162.6 cm)  BEHAVIORAL SYMPTOMS/MOOD NEUROLOGICAL BOWEL NUTRITION STATUS      Continent Diet (See dc summary)  AMBULATORY STATUS COMMUNICATION OF NEEDS Skin   Extensive Assist Verbally Surgical wounds (Closed incision on hip)                       Personal Care Assistance Level of Assistance  Bathing, Feeding, Dressing Bathing Assistance: Maximum assistance Feeding assistance: Independent Dressing Assistance: Maximum assistance     Functional Limitations Info             SPECIAL CARE FACTORS FREQUENCY  PT (By licensed PT), OT (By licensed OT)     PT Frequency: 5x/week OT Frequency: 5x/week            Contractures Contractures Info: Not present    Additional Factors Info  Code Status, Allergies Code Status Info: Full Allergies Info: Aspirin, Nsaids, Tolmetin           Current Medications (09/08/2023):  This is the current hospital active medication list Current Facility-Administered Medications  Medication Dose Route Frequency Provider Last Rate Last Admin   0.9 %  sodium chloride infusion   Intravenous Continuous [place], Alvira Philips, DO       amLODipine (NORVASC) tablet 2.5 mg  2.5 mg Oral Daily Montez Morita, PA-C   2.5 mg at 09/07/23 1610   docusate sodium (COLACE) capsule 100 mg  100 mg Oral BID Montez Morita, PA-C   100 mg at 09/07/23 2203   enoxaparin (LOVENOX) injection 40 mg  40 mg Subcutaneous Q24H Montez Morita, PA-C   40 mg at 09/07/23 1117   famotidine (PEPCID) tablet 40 mg  40 mg Oral Daily PRN Montez Morita, PA-C        hydrALAZINE (APRESOLINE) tablet 10 mg  10 mg Oral Q6H PRN [place], Eric J, DO       levothyroxine (SYNTHROID) tablet 75 mcg  75 mcg Oral Daily Montez Morita, PA-C   75 mcg at 09/07/23 9604   LORazepam (ATIVAN) tablet 0.5 mg  0.5 mg Oral Q6H PRN [place], Eric J, DO   0.5 mg at 09/07/23 2203   melatonin tablet 3 mg  3 mg Oral QHS [place], Alvira Philips, DO   3 mg at 09/07/23 2203   menthol-cetylpyridinium (CEPACOL) lozenge 3 mg  1 lozenge Oral PRN Montez Morita, PA-C   3 mg at 09/06/23 2058   Or   phenol (CHLORASEPTIC) mouth spray 1 spray  1 spray Mouth/Throat PRN Montez Morita, PA-C       methocarbamol (ROBAXIN) tablet 500 mg  500 mg Oral Q6H PRN [place], Eric J, DO       metoCLOPramide (REGLAN) tablet 5-10 mg  5-10 mg Oral Q8H PRN Montez Morita, PA-C       Or   metoCLOPramide (REGLAN) injection 5-10 mg  5-10 mg Intravenous Q8H PRN Montez Morita, PA-C       metoprolol tartrate (LOPRESSOR) tablet 12.5 mg  12.5 mg Oral BID Montez Morita, PA-C   12.5 mg at 09/07/23 2203   morphine (PF) 2 MG/ML injection 1-2 mg  1-2 mg Intravenous Q2H PRN [place], Alvira Philips, DO   2 mg at 09/07/23 0224   ondansetron (ZOFRAN) tablet 4 mg  4 mg Oral Q6H PRN Montez Morita, PA-C       Or   ondansetron [facility]) injection 4 mg  4 mg Intravenous Q6H PRN Montez Morita, PA-C   4 mg at 09/06/23 1119   oxyCODONE-acetaminophen (PERCOCET/ROXICET) 5-325 MG per tablet 1-2 tablet  1-2 tablet Oral Q4H PRN Montez Morita, PA-C   2 tablet at 09/07/23 2205   pantoprazole (PROTONIX) EC tablet 40 mg  40 mg Oral Daily Montez Morita, PA-C   40 mg at 09/07/23 5409   protein supplement (ENSURE MAX) liquid  11 oz Oral BID [place], Eric J, DO   11 oz at 09/07/23 2210   sucralfate (CARAFATE) tablet 1 g  1 g Oral QID PRN Montez Morita, PA-C       traZODone (DESYREL) tablet 25 mg  25 mg Oral QHS [place], Eric J, DO   25 mg at 09/07/23 2203     Discharge Medications: Please see discharge summary for a list of discharge medications.  Relevant Imaging Results:  Relevant  Lab Results:   Additional Information SSN: [id number]  [place], [place]

## 2023-09-08 NOTE — Progress Notes (Signed)
Subjective:  Patient reports pain as mild.  Slept well overnight. Sister at bedside. Was able to stand with PT yesterday but unable to take steps. Hopeful to go to rehab in [place] close to where she lives.    Objective:   VITALS:   Vitals:   09/07/23 1518 09/07/23 1959 09/08/23 0442 09/08/23 0502  BP: (!) 100/52 135/64 (!) 143/69   Pulse: 73 80 72   Resp:  16 16   Temp: 98.1 F (36.7 C) 98.6 F (37 C) 98.8 F (37.1 C)   TempSrc: Oral Oral    SpO2: 93% 95% 92%   Weight:    62.6 kg  Height:        Sensation intact distally Intact pulses distally Dorsiflexion/Plantar flexion intact Incision: dressing C/D/I Compartment soft    Lab Results  Component Value Date   WBC 5.4 09/08/2023   HGB 9.7 (L) 09/08/2023   HCT 28.0 (L) 09/08/2023   MCV 84.6 09/08/2023   PLT 133 (L) 09/08/2023   BMET    Component Value Date/Time   NA 123 (L) 09/08/2023 0450   NA 134 12/08/2020 1542   K 3.9 09/08/2023 0450   CL 92 (L) 09/08/2023 0450   CO2 24 09/08/2023 0450   GLUCOSE 106 (H) 09/08/2023 0450   BUN 15 09/08/2023 0450   BUN 12 12/08/2020 1542   CREATININE 0.64 09/08/2023 0450   CALCIUM 8.1 (L) 09/08/2023 0450   EGFR 82 12/08/2020 1542   GFRNONAA >60 09/08/2023 0450     Assessment/Plan: 2 Days Post-Op   Principal Problem:   Closed right hip fracture [facility])  POD/HD#: 2   83 y/o female s/p fall with R hip fracture    - fall    -R intertrochanteric hip fracture s/p IMN  Weightbearing WBAT with walker  No restrictions                ROM/Activity                         Unrestricted ROM R hip and knee                         Activity as tolerated                          PT/OT evals                         TOC consult                Wound care/other                         Dressing changes as needed starting 09/09/2023                         Ice prn swelling                             - Pain management:             Multimodal             Minimize  narcotics             Favor po meds before IV    - ABL anemia/Hemodynamics  Stable             Monitor    - Medical issues              Per medicine                         Hyponatremia               Cough                          CXR unremarkable                         Afebrile                          No elevated WBC count                         Aggressive IS    - DVT/PE prophylaxis:             Lovenox x 28 days post op    - ID:              Periop abx   - Metabolic Bone Disease:             Known osteoporosis             This is a fragility fracture              Vitamin d levels look ok              Meets criteria for pharmacologic treatment of osteoporosis                          Dexa 2023 T score of -2.0              - Activity:             As above   - Impediments to fracture healing:             Osteoporosis                Thyroid disease    - Dispo:             Therapy evals              TOC consult for SNF              Follow up with ortho in 10-14 days for suture removal and follow up xrays     Sarah Harper A Sarah Harper 09/08/2023, 7:36 AM   Weber Cooks, MD  Contact information:   [phone] 7am-5pm epic message Dr. Blanchie Dessert, or call office for patient follow up: [phone] After hours and holidays please check [email or web address] for group call information for [facility]

## 2023-09-08 NOTE — TOC Initial Note (Addendum)
Transition of Care [facility]) - Initial/Assessment Note    Patient Details  Name: Sarah Harper MRN: [id number] Date of Birth: February 18, 1941  Transition of Care [facility]) CM/SW Contact:    Mearl Latin, LCSW Phone Number: 09/08/2023, 10:06 AM  Clinical Narrative:                 CSW received consult for possible SNF placement at time of discharge. CSW spoke with patient and her sister at bedside with verbal consent. Patient reported that patient's spouse is currently unable to care for patient at their home given patient's current physical needs and fall risk. Patient expressed understanding of PT recommendation and is agreeable to SNF placement at time of discharge. Patient reports preference for SNF near [place]. CSW discussed insurance authorization process and will provide Medicare SNF ratings list. CSW will send out referrals for review and provide bed offers as available. Patient will require PTAR for transport.     Skilled Nursing Rehab Facilities-   [email or web address] Ratings out of 5 stars (the highest)   Name Address  Phone # Quality Care Staffing Health Inspection Overall  [facility] [address] [place] [phone] 2 2 5 5   [facility] [address] [place] [phone] 4 2 4 4   [facility] [address], [place] Otto [phone] 2 1 2 1   [facility] [address], [place] [phone] 3 1 4 3   [facility]  [address], [place] [phone] 4 4 5 5   [facility] [address], [facility] [phone] 3 2 2 2   [facility] [address] [place] [phone] 5 1 2 2   [facility] [address], [place] [phone] 1 1 3 1   [address] ([facility]) [address] [place] [phone] 2 2 2 2   [facility] [address] [place], [place] [phone] 2 2 1 1   [facility] ([place]) [address], [place] [phone] 3 1 1 1    [facility] [address] [place] [phone] 3 3 4 4   [facility] [address] [place] [phone] 3 4 3 3           [facility] [address] [place] [phone] 4 2 1 1   [facility] [address] [place] [phone] 2 2 2 2   [facility] [address], Cheree Ditto [phone] [address] [place] [address], [place] [phone] 1 1 2 1   [facility] [address], [employer] [phone] 2 1 4 3           [address] (no [facility]) [address], [place] [phone] 4 4 5 5   [facility] (No Hum[employer]) [address] [place] [phone] 1 2 4 3   [facility] (No [facility]HC) [address], [place] [phone] 4 1 5 4   [facility] [address] [employer] [phone] 3 4 2 2   [facility] [address], [place] [phone] 2 1 2 1   [facility] [address] [place] [phone] 2 1 1 1   [place] [address] [place] [phone] 4 2 5 5   [facility]  [address], [place] [phone] 2 2 3 3   [facility] [address], [place] [phone] 4 1 1 1   [facility] [address] [place], [place] [phone] 2 2 2 2           [facility] [address], [place] [phone] 2 1 1  1  [facility] [address] Evlyn Clines  [phone] 3 3 3 3   [facility] [address], [place]al[place] Levan [phone] 4 3 5 5   [facility] [address] [place] [phone] 2 1 1 1   [facility] (No Humana) [address] [place] [phone] 2 2 4 4   [facility] [address], [place] Levan [phone] 2 1 2 1           [facility] [address] [place], [place] [phone] 5 4 5 5   [facility])  [address], [place] [phone] 1 1 2 1   [facility] [address] [place], [place] [phone]  2 4 4   [facility] [address] [place]  [phone] 3 5 5 5   [address] [place], [place] [phone] 4 2 2 2   [facility]) [address] [place] [phone] 2 1 3 2      Expected Discharge Plan: Skilled Nursing Facility Barriers to Discharge: Insurance Authorization, Continued Medical Work up, SNF Pending bed offer   Patient Goals and CMS Choice Patient states their goals for this hospitalization and ongoing recovery are:: Rehab CMS Medicare.gov Compare Post Acute Care list provided to:: Patient Choice offered to / list presented to : Patient [place] ownership interest in [facility].provided to:: Patient    Expected Discharge Plan and Services In-house Referral: Clinical Social Work   Post Acute Care Choice: Skilled Nursing Facility Living arrangements for the past 2 months: Single Family Home                                      Prior Living Arrangements/Services Living arrangements for the past 2 months: Single Family Home Lives with:: Spouse Patient language and need for interpreter reviewed:: Yes Do you feel safe going back to the place where you live?: Yes      Need for Family Participation in Patient Care: Yes (Comment) Care giver support system in place?: Yes (comment)   Criminal Activity/Legal Involvement Pertinent to Current Situation/Hospitalization: No - Comment as needed  Activities of Daily Living   ADL Screening (condition at time of admission) Independently performs ADLs?: No Does the patient have a NEW difficulty with bathing/dressing/toileting/self-feeding that is expected to last >3 days?: Yes (Initiates electronic notice to provider for possible OT consult) Does the patient have a NEW difficulty with getting in/out of bed, walking, or climbing stairs that is expected to last >3 days?: Yes (Initiates electronic notice to provider for possible PT consult) Does the patient have a NEW difficulty with communication that is  expected to last >3 days?: No Is the patient deaf or have difficulty hearing?: No Does the patient have difficulty seeing, even when wearing glasses/contacts?: No Does the patient have difficulty concentrating, remembering, or making decisions?: No  Permission Sought/Granted Permission sought to share information with : Facility Industrial/product designer granted to share information with : Yes, Verbal Permission Granted     Permission granted to share info w AGENCY: SNFs        Emotional Assessment Appearance:: Appears stated age Attitude/Demeanor/Rapport: Engaged Affect (typically observed): Accepting, Appropriate, Pleasant Orientation: : Oriented to Self, Oriented to Place, Oriented to  Time, Oriented to Situation Alcohol / Substance Use: Not Applicable Psych Involvement: No (comment)  Admission diagnosis:  Closed right hip fracture (HCC) [S72.001A] Patient Active Problem List   Diagnosis  Date Noted   Closed right hip fracture (HCC) 09/05/2023   Abnormal urinalysis 09/02/2023   Acute cystitis 08/21/2023   Current use of proton pump inhibitor 03/13/2023   Traumatic ecchymosis of right foot 02/13/2023   Pain in joint, lower leg 01/30/2023   Senile purpura (HCC) 12/26/2022   Skin tear of right lower leg without complication 12/26/2022   Dysuria 10/21/2021   Elevated alkaline phosphatase level 03/11/2021   Palpitations 11/10/2020   Migraine    Hypertension, essential    H/O: CVA (cerebrovascular accident) 11/08/2020   Headache 09/26/2019   Fall at home 06/02/2019   Colon cancer screening 02/05/2018   Degenerative joint disease (DJD) of lumbar spine 05/02/2017   Internal hemorrhoids 05/02/2017   Generalized osteoarthritis of hand 12/11/2016   GAD (generalized anxiety disorder) 12/11/2016   Estrogen deficiency 05/11/2016   Encounter for screening mammogram for breast cancer 05/11/2016   OSA (obstructive sleep apnea) 03/06/2016   Fatigue 01/25/2016   Left knee pain  10/18/2015   Snoring 10/18/2015   Routine general medical examination at a health care facility 02/28/2015   Encounter for Medicare annual wellness exam 02/06/2013   Hyperlipidemia 08/27/2012   Irregular heart beat 08/05/2012   Encounter for gynecological examination 12/20/2010   Osteopenia 12/17/2009   BACK PAIN, LUMBAR 07/02/2009   IRRITABLE BOWEL SYNDROME 04/02/2009   Asymptomatic postmenopausal status 12/10/2008   Hypothyroidism 12/05/2007   Meniere's disease 12/05/2007   GERD 12/05/2007   PCP:  Judy Pimple, MD Pharmacy:   [facility] [phone] [facility] [address] [place] - [address] [place] [address] Phone: [phone] Fax: [phone]  [facility] - [place], [place] - [address] [place] [address] Phone: [phone] Fax: [phone]     Social Drivers of Health (SDOH) Social History: SDOH Screenings   Food Insecurity: No Food Insecurity (09/05/2023)  Housing: High Risk (09/05/2023)  Transportation Needs: No Transportation Needs (09/05/2023)  Utilities: At Risk (09/05/2023)  Alcohol Screen: Low Risk  (02/12/2023)  Depression (PHQ2-9): Low Risk  (08/08/2023)  Financial Resource Strain: Low Risk  (02/12/2023)  Physical Activity: Insufficiently Active (02/12/2023)  Social Connections: Moderately Integrated (09/05/2023)  Stress: No Stress Concern Present (02/12/2023)  Tobacco Use: Low Risk  (09/06/2023)   SDOH Interventions:     Readmission Risk Interventions     No data to display

## 2023-09-09 DIAGNOSIS — U071 COVID-19: Secondary | ICD-10-CM | POA: Diagnosis not present

## 2023-09-09 DIAGNOSIS — S72001A Fracture of unspecified part of neck of right femur, initial encounter for closed fracture: Secondary | ICD-10-CM | POA: Diagnosis not present

## 2023-09-09 LAB — BASIC METABOLIC PANEL
Anion gap: 8 (ref 5–15)
BUN: 14 mg/dL (ref 8–23)
CO2: 23 mmol/L (ref 22–32)
Calcium: 8.2 mg/dL — ABNORMAL LOW (ref 8.9–10.3)
Chloride: 93 mmol/L — ABNORMAL LOW (ref 98–111)
Creatinine, Ser: 0.56 mg/dL (ref 0.44–1.00)
GFR, Estimated: >60 mL/min (ref >60)
Glucose, Bld: 94 mg/dL (ref 70–99)
Potassium: 4.1 mmol/L (ref 3.5–5.1)
Sodium: 124 mmol/L — ABNORMAL LOW (ref 135–145)

## 2023-09-09 LAB — CBC
HCT: 28.3 % — ABNORMAL LOW (ref 36.0–46.0)
Hemoglobin: 9.7 g/dL — ABNORMAL LOW (ref 12.0–15.0)
MCH: 29.2 pg (ref 26.0–34.0)
MCHC: 34.3 g/dL (ref 30.0–36.0)
MCV: 85.2 fL (ref 80.0–100.0)
Platelets: 145 10*3/uL — ABNORMAL LOW (ref 150–400)
RBC: 3.32 MIL/uL — ABNORMAL LOW (ref 3.87–5.11)
RDW: 13.4 % (ref 11.5–15.5)
WBC: 5.9 10*3/uL (ref 4.0–10.5)
nRBC: 0 % (ref 0.0–0.2)

## 2023-09-09 MED ORDER — SODIUM CHLORIDE 0.9 % IV SOLN: INTRAVENOUS | Status: DC

## 2023-09-09 NOTE — Plan of Care (Signed)
  Problem: Education: Goal: Knowledge of General Education information will improve Description: Including pain rating scale, medication(s)/side effects and non-pharmacologic comfort measures Outcome: Progressing   Problem: Health Behavior/Discharge Planning: Goal: Ability to manage health-related needs will improve Outcome: Progressing   Problem: Clinical Measurements: Goal: Ability to maintain clinical measurements within normal limits will improve Outcome: Progressing Goal: Will remain free from infection Outcome: Progressing Goal: Diagnostic test results will improve Outcome: Progressing Goal: Respiratory complications will improve Outcome: Progressing Goal: Cardiovascular complication will be avoided Outcome: Progressing   Problem: Activity: Goal: Risk for activity intolerance will decrease Outcome: Progressing   Problem: Nutrition: Goal: Adequate nutrition will be maintained Outcome: Progressing   Problem: Coping: Goal: Level of anxiety will decrease Outcome: Progressing   Problem: Elimination: Goal: Will not experience complications related to bowel motility Outcome: Progressing Goal: Will not experience complications related to urinary retention Outcome: Progressing   Problem: Pain Managment: Goal: General experience of comfort will improve and/or be controlled Outcome: Progressing   Problem: Safety: Goal: Ability to remain free from injury will improve Outcome: Progressing   Problem: Skin Integrity: Goal: Risk for impaired skin integrity will decrease Outcome: Progressing   Problem: Education: Goal: Verbalization of understanding the information provided (i.e., activity precautions, restrictions, etc) will improve Outcome: Progressing Goal: Individualized Educational Video(s) Outcome: Progressing   Problem: Activity: Goal: Ability to ambulate and perform ADLs will improve Outcome: Progressing   Problem: Clinical Measurements: Goal: Postoperative  complications will be avoided or minimized Outcome: Progressing   Problem: Self-Concept: Goal: Ability to maintain and perform role responsibilities to the fullest extent possible will improve Outcome: Progressing   Problem: Pain Management: Goal: Pain level will decrease Outcome: Progressing   Problem: Education: Goal: Knowledge of risk factors and measures for prevention of condition will improve Outcome: Progressing   Problem: Coping: Goal: Psychosocial and spiritual needs will be supported Outcome: Progressing   Problem: Respiratory: Goal: Will maintain a patent airway Outcome: Progressing Goal: Complications related to the disease process, condition or treatment will be avoided or minimized Outcome: Progressing

## 2023-09-09 NOTE — Progress Notes (Addendum)
PROGRESS NOTE    Sarah Harper  NGE:[id number] DOB: [birth year] DOA: 09/05/2023 PCP: Judy Pimple, MD    Brief Narrative:   Sarah Harper is a 83 y.o. female with past medical history significant for HTN, hypothyroidism, dyslipidemia, GERD, osteopenia, OSA on CPAP who presented to [facility] ED on 1/15 with complaint of right hip pain.  Patient reports she tripped and fell onto her right side without loss of consciousness.  She was unable to ambulate or get up after event.  EMS was called to her home where there was noticeable shortening and rotation of the right leg.  Patient denied chest pain, no dizziness, no shortness of breath, no palpitations.  Patient was transported to the ED for further evaluation and management.  In the ED, temperature 99.3 F, HR 84, RR 18, BP 166/105, SpO2 95% on room air.  WBC of 7.2, hemoglobin 12.9, platelet count 185.  Sodium 126, potassium 3.2, chloride 93, CO2 21, glucose 106, BUN 9, creatinine 0.74.  TSH 1.538.  Chest x-ray with no active cardiopulmonary disease process.  X-ray right pelvis with acute impacted, angulated, displaced intertrochanteric fracture proximal right femur.  Right femur x-ray with intertrochanteric fracture right femoral neck.  Orthopedics was consulted.  TRH consulted for admission for further evaluation management of right femur fracture.  Assessment & Plan:    Right displaced, angulated, impacted intertrochanteric fracture proximal right femur Patient presenting to the ED with acute right hip pain, inability to ambulate following mechanical fall at home.  Denies loss of consciousness or striking her head.  Imaging notable for acute impacted, angulated displaced intertrochanteric fracture of proximal right femur.  Orthopedics was consulted and patient underwent intramedullary nailing of the right hip on 09/06/2023 by Dr. Carola Frost. -- Orthopedics following, appreciate assistance -- vitamin D 25-hydroxy level 44.40 -- WBAT RLE, OOB q shift --  Lovenox for DVT prophylaxis -- Percocet 1-2 tabs p.o. q4h PRN moderate pain  -- Morphine 1-2 mg IV q2h PRN severe pain -- Robaxin 500 mg p.o. q6h PRN muscle spasms -- PT/OT evaluation: Recommend SNF placement; TOC consulted -- Outpatient follow-up with orthopedics 2 weeks for suture removal and wound check  COVID viral infection Asymptomatic other than mild cough, no hypoxia. -- Paxlovid 300mg  PO BID x 5 days -- Airborne/contact isolation precautions  Acute postoperative blood loss anemia -- Hgb 12.9>>11.3>9.7>5.9 -- Transfuse for hemoglobin less than 7.0 -- Repeat CBC in a.m.  Hyponatremia Etiology likely secondary to SIADH versus recent use of Bactrim.  Serum osmolality 272, urine osmolality 319, urine sodium 80. -- Na 126>126>127>123>124 -- NS at 100 mL/h  -- Repeat BMP in a.m.  Hypokalemia Repleted.  Essential hypertension -- Amlodipine 2.5 mg p.o. daily -- Metoprolol tartrate 12.5 mg p.o. twice daily  Hypothyroidism TSH 1.53, within normal limits. -- Levothyroxine 75 mcg p.o. daily  History of dyslipidemia Currently not on medications prior to admission.  GERD -- Protonix 40 mg p.o. daily -- Pepcid 40 mg p.o. daily.  Heartburn/indigestion -- Carafate 1 g p.o. 3 times daily as needed esophagitis  Insomnia -- Melatonin 3 minutes p.o. nightly -- Trazodone 25 m p.o. nightly   DVT prophylaxis: SCDs Start: 09/06/23 1107 enoxaparin (LOVENOX) injection 40 mg Start: 09/05/23 1200    Code Status: Full Code Family Communication: Updated sister present at bedside this morning  Disposition Plan:  Level of care: Med-Surg Status is: Inpatient Remains inpatient appropriate because: Pending SNF placement    Consultants:  Orthopedics, Dr. Carola Frost  Procedures:  Right femur  intramedullary nailing, Dr. Carola Frost 1/16  Antimicrobials:  Perioperative cefazolin   Subjective: Seen examined bedside, resting calmly.  Lying in bed.  Eating breakfast.  Sister and RN present at  bedside.  Frustrated about being in bed all day yesterday, did not work with physical therapy.  Discussed with RN she needs to be out of bed every shift.  Also frustrated with the size of pills she has to take, and ask if they can be crushed.  Positive for COVID infection yesterday, likely from transmission from her husband.  Still await SNF placement.  Continues with some mild pain/soreness to operative site.  No other specific questions, concerns or complaints at this time.  Denies headache, no vision changes, no dizziness, no chest pain, no shortness of breath, no abdominal pain, no fever/chills, no diarrhea, no focal weakness, no paresthesias.  No acute events overnight per nursing staff.  Awaiting SNF placement.  Objective: Vitals:   09/08/23 2117 09/09/23 0549 09/09/23 0757 09/09/23 0931  BP: (!) 151/89 128/67 (!) 143/65 131/64  Pulse: 87 74 77 80  Resp: 19 15    Temp: 98.8 F (37.1 C) 98.8 F (37.1 C) 98.2 F (36.8 C)   TempSrc: Oral Oral Oral   SpO2: 96% 96% 92% 93%  Weight:      Height:        Intake/Output Summary (Last 24 hours) at 09/09/2023 1208 Last data filed at 09/09/2023 0550 Gross per 24 hour  Intake --  Output 400 ml  Net -400 ml   Filed Weights   09/06/23 0717 09/08/23 0502  Weight: 59 kg 62.6 kg    Examination:  Physical Exam: GEN: NAD, alert, oriented to person/place/time HEENT: NCAT, PERRL, EOMI, sclera clear, MMM PULM: CTAB w/o wheezes/crackles, normal respiratory effort, on room air CV: RRR w/o M/G/R GI: abd soft, NTND, NABS, no R/G/M MSK: Right hip with surgical dressings in place, clean/dry/intact without surrounding erythema/ecchymosis, no peripheral edema, muscle strength globally intact 5/5 bilateral upper/lower extremities, neurovascularly intact NEURO: No focal neurological deficits PSYCH: normal mood/affect Integumentary: No concerning rashes/lesions/wounds noted on exposed skin surfaces.    Data Reviewed: I have personally reviewed  following labs and imaging studies  CBC: Recent Labs  Lab 09/05/23 0805 09/05/23 1255 09/06/23 0527 09/07/23 0642 09/08/23 0450 09/09/23 0450  WBC 7.2 8.4 5.6 7.4 5.4 5.9  NEUTROABS 5.8  --   --   --   --   --   HGB 12.9 12.5 12.6 11.3* 9.7* 9.7*  HCT 38.4 37.6 36.9 33.3* 28.0* 28.3*  MCV 86.3 87.0 85.6 84.7 84.6 85.2  PLT 185 201 188 156 133* 145*   Basic Metabolic Panel: Recent Labs  Lab 09/05/23 0805 09/05/23 1255 09/06/23 0527 09/07/23 0642 09/08/23 0450 09/09/23 0450  NA 126*  --  126* 127* 123* 124*  K 3.2*  --  4.1 4.2 3.9 4.1  CL 93*  --  94* 95* 92* 93*  CO2 21*  --  23 24 24 23   GLUCOSE 106*  --  123* 114* 106* 94  BUN 9  --  13 12 15 14   CREATININE 0.74 0.78 0.77 0.74 0.64 0.56  CALCIUM 8.8* 8.9 9.0 8.7* 8.1* 8.2*  MG  --  1.7  --  1.6* 2.0  --    GFR: Estimated Creatinine Clearance: 46.8 mL/min (by C-G formula based on SCr of 0.56 mg/dL). Liver Function Tests: Recent Labs  Lab 09/05/23 1255  ALBUMIN 3.8   No results for input(s): "LIPASE", "AMYLASE" in the  last 168 hours. No results for input(s): "AMMONIA" in the last 168 hours. Coagulation Profile: No results for input(s): "INR", "PROTIME" in the last 168 hours. Cardiac Enzymes: No results for input(s): "CKTOTAL", "CKMB", "CKMBINDEX", "TROPONINI" in the last 168 hours. BNP (last 3 results) No results for input(s): "PROBNP" in the last 8760 hours. HbA1C: No results for input(s): "HGBA1C" in the last 72 hours. CBG: No results for input(s): "GLUCAP" in the last 168 hours. Lipid Profile: No results for input(s): "CHOL", "HDL", "LDLCALC", "TRIG", "CHOLHDL", "LDLDIRECT" in the last 72 hours. Thyroid Function Tests: No results for input(s): "TSH", "T4TOTAL", "FREET4", "T3FREE", "THYROIDAB" in the last 72 hours.  Anemia Panel: No results for input(s): "VITAMINB12", "FOLATE", "FERRITIN", "TIBC", "IRON", "RETICCTPCT" in the last 72 hours. Sepsis Labs: No results for input(s): "PROCALCITON",  "LATICACIDVEN" in the last 168 hours.  Recent Results (from the past 240 hours)  Urine Culture     Status: Abnormal   Collection Time: 09/03/23 10:26 AM   Specimen: Urine  Result Value Ref Range Status   MICRO NUMBER: [id number]  Final   SPECIMEN QUALITY: Adequate  Final   Sample Source URINE  Final   STATUS: FINAL  Final   ISOLATE 1: Escherichia coli (A)  Final    Comment: 50,000-100,000 CFU/mL of Escherichia coli      Susceptibility   Escherichia coli - URINE CULTURE, REFLEX    AMOX/CLAVULANIC 8 Sensitive     AMPICILLIN >=32 Resistant     AMPICILLIN/SULBACTAM >=32 Resistant     CEFAZOLIN* <=4 Not Reportable      * For infections other than uncomplicated UTI caused by E. coli, K. pneumoniae or P. mirabilis: Cefazolin is resistant if MIC > or = 8 mcg/mL. (Distinguishing susceptible versus intermediate for isolates with MIC < or = 4 mcg/mL requires additional testing.) For uncomplicated UTI caused by E. coli, K. pneumoniae or P. mirabilis: Cefazolin is susceptible if MIC <32 mcg/mL and predicts susceptible to the oral agents cefaclor, cefdinir, cefpodoxime, cefprozil, cefuroxime, cephalexin and loracarbef.     CEFTAZIDIME <=1 Sensitive     CEFEPIME <=1 Sensitive     CEFTRIAXONE <=1 Sensitive     CIPROFLOXACIN <=0.25 Sensitive     LEVOFLOXACIN <=0.12 Sensitive     GENTAMICIN <=1 Sensitive     IMIPENEM <=0.25 Sensitive     NITROFURANTOIN <=16 Sensitive     PIP/TAZO <=4 Sensitive     TOBRAMYCIN <=1 Sensitive     TRIMETH/SULFA* <=20 Sensitive      * For infections other than uncomplicated UTI caused by E. coli, K. pneumoniae or P. mirabilis: Cefazolin is resistant if MIC > or = 8 mcg/mL. (Distinguishing susceptible versus intermediate for isolates with MIC < or = 4 mcg/mL requires additional testing.) For uncomplicated UTI caused by E. coli, K. pneumoniae or P. mirabilis: Cefazolin is susceptible if MIC <32 mcg/mL and predicts susceptible to the oral agents cefaclor,  cefdinir, cefpodoxime, cefprozil, cefuroxime, cephalexin and loracarbef. Legend: S = Susceptible  I = Intermediate R = Resistant  NS = Not susceptible SDD = Susceptible Dose Dependent * = Not Tested  NR = Not Reported **NN = See Therapy Comments   Surgical pcr screen     Status: None   Collection Time: 09/05/23  3:41 PM   Specimen: Nasal Mucosa; Nasal Swab  Result Value Ref Range Status   MRSA, PCR NEGATIVE NEGATIVE Final   Staphylococcus aureus NEGATIVE NEGATIVE Final    Comment: (NOTE) The Xpert SA Assay (FDA approved for NASAL specimens in  patients 5 years of age and older), is one component of a comprehensive surveillance program. It is not intended to diagnose infection nor to guide or monitor treatment. Performed at [facility], [address] [place], [place] [address]   SARS Coronavirus 2 by RT PCR (hospital order, performed in [facility] hospital lab) *cepheid single result test* Anterior Nasal Swab     Status: Abnormal   Collection Time: 09/08/23 11:15 AM   Specimen: Anterior Nasal Swab  Result Value Ref Range Status   SARS Coronavirus 2 by RT PCR POSITIVE (A) NEGATIVE Final    Comment: Performed at [facility], [address], [place], [place] [address]         Radiology Studies: No results found.       Scheduled Meds:  amLODipine  2.5 mg Oral Daily   docusate sodium  100 mg Oral BID   enoxaparin (LOVENOX) injection  40 mg Subcutaneous Q24H   levothyroxine  75 mcg Oral Daily   melatonin  3 mg Oral QHS   metoprolol tartrate  12.5 mg Oral BID   nirmatrelvir/ritonavir  3 tablet Oral BID   pantoprazole  40 mg Oral Daily   Ensure Max Protein  11 oz Oral BID   traZODone  25 mg Oral QHS   Continuous Infusions:  sodium chloride 100 mL/hr at 09/09/23 0910      LOS: 4 days    Time spent: 53 minutes spent on chart review, discussion with nursing staff, consultants, updating family and interview/physical exam; more than 50% of that time  was spent in counseling and/or coordination of care.    Alvira Philips [place], DO [facility] Available via Epic secure chat 7am-7pm After these hours, please refer to coverage provider listed on [email or web address] 09/09/2023, 12:08 PM

## 2023-09-09 NOTE — Progress Notes (Signed)
Subjective:  Patient reports pain as mild.  Slept well overnight. Sister at bedside.  Did not work with physical therapy yesterday or get out of bed.  Patient and family frustrated with lack of mobility progress.  Hopeful and eager to mobilize today.  Objective:   VITALS:   Vitals:   09/08/23 2117 09/09/23 0549 09/09/23 0757 09/09/23 0931  BP: (!) 151/89 128/67 (!) 143/65 131/64  Pulse: 87 74 77 80  Resp: 19 15    Temp: 98.8 F (37.1 C) 98.8 F (37.1 C) 98.2 F (36.8 C)   TempSrc: Oral Oral Oral   SpO2: 96% 96% 92% 93%  Weight:      Height:        Sensation intact distally Intact pulses distally Dorsiflexion/Plantar flexion intact Incision: dressing C/D/I Compartment soft    Lab Results  Component Value Date   WBC 5.9 09/09/2023   HGB 9.7 (L) 09/09/2023   HCT 28.3 (L) 09/09/2023   MCV 85.2 09/09/2023   PLT 145 (L) 09/09/2023   BMET    Component Value Date/Time   NA 124 (L) 09/09/2023 0450   NA 134 12/08/2020 1542   K 4.1 09/09/2023 0450   CL 93 (L) 09/09/2023 0450   CO2 23 09/09/2023 0450   GLUCOSE 94 09/09/2023 0450   BUN 14 09/09/2023 0450   BUN 12 12/08/2020 1542   CREATININE 0.56 09/09/2023 0450   CALCIUM 8.2 (L) 09/09/2023 0450   EGFR 82 12/08/2020 1542   GFRNONAA >60 09/09/2023 0450     Assessment/Plan: 3 Days Post-Op   Principal Problem:   Closed right hip fracture (HCC)  POD/HD#: 2   83 y/o female s/p fall with R hip fracture    - fall    -R intertrochanteric hip fracture s/p IMN  Weightbearing WBAT with walker  No restrictions                ROM/Activity                         Unrestricted ROM R hip and knee                         Activity as tolerated                          PT/OT evals                         TOC consult                Wound care/other                         Dressing changes as needed starting 09/09/2023                         Ice prn swelling                             - Pain management:              Multimodal             Minimize narcotics             Favor po meds before IV    - ABL anemia/Hemodynamics  Stable             Monitor    - Medical issues              Per medicine                         Hyponatremia               Cough                          CXR unremarkable                         Afebrile                          No elevated WBC count                         Aggressive IS    - DVT/PE prophylaxis:             Lovenox x 28 days post op    - ID:              Periop abx   - Metabolic Bone Disease:             Known osteoporosis             This is a fragility fracture              Vitamin d levels look ok              Meets criteria for pharmacologic treatment of osteoporosis                          Dexa 2023 T score of -2.0              - Activity:             As above   - Impediments to fracture healing:             Osteoporosis                Thyroid disease    - Dispo:             Therapy evals              TOC consult for SNF              Follow up with ortho in 10-14 days for suture removal and follow up xrays     Sarah Harper A Sarah Harper 09/09/2023, 10:18 AM   Weber Cooks, MD  Contact information:   [phone] 7am-5pm epic message Dr. Blanchie Dessert, or call office for patient follow up: [phone] After hours and holidays please check [email or web address] for group call information for [facility]

## 2023-09-10 DIAGNOSIS — U071 COVID-19: Secondary | ICD-10-CM | POA: Diagnosis not present

## 2023-09-10 DIAGNOSIS — S72001A Fracture of unspecified part of neck of right femur, initial encounter for closed fracture: Secondary | ICD-10-CM | POA: Diagnosis not present

## 2023-09-10 LAB — BASIC METABOLIC PANEL
Anion gap: 9 (ref 5–15)
BUN: 13 mg/dL (ref 8–23)
CO2: 22 mmol/L (ref 22–32)
Calcium: 8.3 mg/dL — ABNORMAL LOW (ref 8.9–10.3)
Chloride: 98 mmol/L (ref 98–111)
Creatinine, Ser: 0.54 mg/dL (ref 0.44–1.00)
GFR, Estimated: >60 mL/min (ref >60)
Glucose, Bld: 99 mg/dL (ref 70–99)
Potassium: 4.1 mmol/L (ref 3.5–5.1)
Sodium: 129 mmol/L — ABNORMAL LOW (ref 135–145)

## 2023-09-10 LAB — CBC
HCT: 29.3 % — ABNORMAL LOW (ref 36.0–46.0)
Hemoglobin: 9.7 g/dL — ABNORMAL LOW (ref 12.0–15.0)
MCH: 29 pg (ref 26.0–34.0)
MCHC: 33.1 g/dL (ref 30.0–36.0)
MCV: 87.5 fL (ref 80.0–100.0)
Platelets: 192 10*3/uL (ref 150–400)
RBC: 3.35 MIL/uL — ABNORMAL LOW (ref 3.87–5.11)
RDW: 13.4 % (ref 11.5–15.5)
WBC: 5.9 10*3/uL (ref 4.0–10.5)
nRBC: 0 % (ref 0.0–0.2)

## 2023-09-10 MED ORDER — SODIUM CHLORIDE 0.9 % IV SOLN: INTRAVENOUS | Status: DC

## 2023-09-10 NOTE — Progress Notes (Signed)
PROGRESS NOTE    Sarah Harper  NGE:[id number] DOB: [birth year] DOA: 09/05/2023 PCP: Judy Pimple, MD    Brief Narrative:   Sarah Harper is a 83 y.o. female with past medical history significant for HTN, hypothyroidism, dyslipidemia, GERD, osteopenia, OSA on CPAP who presented to [facility] ED on 1/15 with complaint of right hip pain.  Patient reports she tripped and fell onto her right side without loss of consciousness.  She was unable to ambulate or get up after event.  EMS was called to her home where there was noticeable shortening and rotation of the right leg.  Patient denied chest pain, no dizziness, no shortness of breath, no palpitations.  Patient was transported to the ED for further evaluation and management.  In the ED, temperature 99.3 F, HR 84, RR 18, BP 166/105, SpO2 95% on room air.  WBC of 7.2, hemoglobin 12.9, platelet count 185.  Sodium 126, potassium 3.2, chloride 93, CO2 21, glucose 106, BUN 9, creatinine 0.74.  TSH 1.538.  Chest x-ray with no active cardiopulmonary disease process.  X-ray right pelvis with acute impacted, angulated, displaced intertrochanteric fracture proximal right femur.  Right femur x-ray with intertrochanteric fracture right femoral neck.  Orthopedics was consulted.  TRH consulted for admission for further evaluation management of right femur fracture.  Assessment & Plan:    Right displaced, angulated, impacted intertrochanteric fracture proximal right femur Patient presenting to the ED with acute right hip pain, inability to ambulate following mechanical fall at home.  Denies loss of consciousness or striking her head.  Imaging notable for acute impacted, angulated displaced intertrochanteric fracture of proximal right femur.  Orthopedics was consulted and patient underwent intramedullary nailing of the right hip on 09/06/2023 by Dr. Carola Frost. -- Orthopedics following, appreciate assistance -- vitamin D 25-hydroxy level 44.40 -- WBAT RLE, OOB q shift --  Lovenox for DVT prophylaxis -- Percocet 1-2 tabs p.o. q4h PRN moderate pain  -- Morphine 1-2 mg IV q2h PRN severe pain -- Robaxin 500 mg p.o. q6h PRN muscle spasms -- PT/OT evaluation: Recommend SNF placement; TOC consulted -- Outpatient follow-up with orthopedics 2 weeks for suture removal and wound check  COVID viral infection Asymptomatic other than mild cough, no hypoxia. -- Paxlovid 300mg  PO BID x 5 days -- Airborne/contact isolation precautions  Acute postoperative blood loss anemia -- Hgb 12.9>>11.3>9.7>9.7>9.7; stable -- Transfuse for hemoglobin less than 7.0  Orthostasis Noted on physical therapy evaluation today -- Continue IV fluid hydration today -- Supportive care, fall precautions  Hyponatremia Etiology likely secondary to SIADH versus recent use of Bactrim.  Serum osmolality 272, urine osmolality 319, urine sodium 80. -- Na 126>126>127>123>124>129 -- NS at 100 mL/h  -- Repeat BMP in a.m.  Hypokalemia Repleted.  Essential hypertension -- Amlodipine 2.5 mg p.o. daily -- Metoprolol tartrate 12.5 mg p.o. twice daily  Hypothyroidism TSH 1.53, within normal limits. -- Levothyroxine 75 mcg p.o. daily  History of dyslipidemia Currently not on medications prior to admission.  GERD -- Protonix 40 mg p.o. daily -- Pepcid 40 mg p.o. daily.  Heartburn/indigestion -- Carafate 1 g p.o. 3 times daily as needed esophagitis  Insomnia -- Melatonin 3 minutes p.o. nightly -- Trazodone 25 m p.o. nightly   DVT prophylaxis: SCDs Start: 09/06/23 1107 enoxaparin (LOVENOX) injection 40 mg Start: 09/05/23 1200    Code Status: Full Code Family Communication: Updated sister present at bedside this morning  Disposition Plan:  Level of care: Med-Surg Status is: Inpatient Remains inpatient appropriate because: Pending  SNF placement    Consultants:  Orthopedics, Dr. Carola Frost  Procedures:  Right femur intramedullary nailing, Dr. Carola Frost 1/16  Antimicrobials:   Perioperative cefazolin   Subjective: Seen examined bedside, resting calmly.  Lying in bed.  Reports feeling weak/fatigued.  Work with physical therapy this morning with drop in blood pressure consistent with orthostasis with associated dizziness.  Remains on IV fluids.    Denies leg pain this morning.  No other specific questions, concerns or complaints at this time.  Denies headache, no vision changes, no dizziness, no chest pain, no shortness of breath, no abdominal pain, no fever/chills, no diarrhea, no focal weakness, no paresthesias.  No acute events overnight per nursing staff.  Awaiting SNF placement.  Objective: Vitals:   09/09/23 1434 09/09/23 2030 09/10/23 0500 09/10/23 0738  BP: 123/61 96/75 (!) 143/78 (!) 154/72  Pulse: 65 69 73 83  Resp:   16   Temp: 98.2 F (36.8 C) 98.2 F (36.8 C) 98.4 F (36.9 C) 98.4 F (36.9 C)  TempSrc: Oral Oral Oral Oral  SpO2: 99% 98% 92% 97%  Weight:      Height:        Intake/Output Summary (Last 24 hours) at 09/10/2023 1139 Last data filed at 09/10/2023 1610 Gross per 24 hour  Intake --  Output 1300 ml  Net -1300 ml   Filed Weights   09/06/23 0717 09/08/23 0502  Weight: 59 kg 62.6 kg    Examination:  Physical Exam: GEN: NAD, alert, oriented to person/place/time HEENT: NCAT, PERRL, EOMI, sclera clear, MMM PULM: CTAB w/o wheezes/crackles, normal respiratory effort, on room air CV: RRR w/o M/G/R GI: abd soft, NTND, NABS, no R/G/M MSK: Right hip with surgical dressings in place, clean/dry/intact without surrounding erythema/ecchymosis, no peripheral edema, muscle strength globally intact 5/5 bilateral upper/lower extremities, neurovascularly intact NEURO: No focal neurological deficits PSYCH: normal mood/affect Integumentary: No concerning rashes/lesions/wounds noted on exposed skin surfaces.    Data Reviewed: I have personally reviewed following labs and imaging studies  CBC: Recent Labs  Lab 09/05/23 0805 09/05/23 1255  09/06/23 0527 09/07/23 0642 09/08/23 0450 09/09/23 0450 09/10/23 0452  WBC 7.2   < > 5.6 7.4 5.4 5.9 5.9  NEUTROABS 5.8  --   --   --   --   --   --   HGB 12.9   < > 12.6 11.3* 9.7* 9.7* 9.7*  HCT 38.4   < > 36.9 33.3* 28.0* 28.3* 29.3*  MCV 86.3   < > 85.6 84.7 84.6 85.2 87.5  PLT 185   < > 188 156 133* 145* 192   < > = values in this interval not displayed.   Basic Metabolic Panel: Recent Labs  Lab 09/05/23 1255 09/06/23 0527 09/07/23 0642 09/08/23 0450 09/09/23 0450 09/10/23 0452  NA  --  126* 127* 123* 124* 129*  K  --  4.1 4.2 3.9 4.1 4.1  CL  --  94* 95* 92* 93* 98  CO2  --  23 24 24 23 22   GLUCOSE  --  123* 114* 106* 94 99  BUN  --  13 12 15 14 13   CREATININE 0.78 0.77 0.74 0.64 0.56 0.54  CALCIUM 8.9 9.0 8.7* 8.1* 8.2* 8.3*  MG 1.7  --  1.6* 2.0  --   --    GFR: Estimated Creatinine Clearance: 46.8 mL/min (by C-G formula based on SCr of 0.54 mg/dL). Liver Function Tests: Recent Labs  Lab 09/05/23 1255  ALBUMIN 3.8   No results  for input(s): "LIPASE", "AMYLASE" in the last 168 hours. No results for input(s): "AMMONIA" in the last 168 hours. Coagulation Profile: No results for input(s): "INR", "PROTIME" in the last 168 hours. Cardiac Enzymes: No results for input(s): "CKTOTAL", "CKMB", "CKMBINDEX", "TROPONINI" in the last 168 hours. BNP (last 3 results) No results for input(s): "PROBNP" in the last 8760 hours. HbA1C: No results for input(s): "HGBA1C" in the last 72 hours. CBG: No results for input(s): "GLUCAP" in the last 168 hours. Lipid Profile: No results for input(s): "CHOL", "HDL", "LDLCALC", "TRIG", "CHOLHDL", "LDLDIRECT" in the last 72 hours. Thyroid Function Tests: No results for input(s): "TSH", "T4TOTAL", "FREET4", "T3FREE", "THYROIDAB" in the last 72 hours.  Anemia Panel: No results for input(s): "VITAMINB12", "FOLATE", "FERRITIN", "TIBC", "IRON", "RETICCTPCT" in the last 72 hours. Sepsis Labs: No results for input(s): "PROCALCITON",  "LATICACIDVEN" in the last 168 hours.  Recent Results (from the past 240 hours)  Urine Culture     Status: Abnormal   Collection Time: 09/03/23 10:26 AM   Specimen: Urine  Result Value Ref Range Status   MICRO NUMBER: [id number]  Final   SPECIMEN QUALITY: Adequate  Final   Sample Source URINE  Final   STATUS: FINAL  Final   ISOLATE 1: Escherichia coli (A)  Final    Comment: 50,000-100,000 CFU/mL of Escherichia coli      Susceptibility   Escherichia coli - URINE CULTURE, REFLEX    AMOX/CLAVULANIC 8 Sensitive     AMPICILLIN >=32 Resistant     AMPICILLIN/SULBACTAM >=32 Resistant     CEFAZOLIN* <=4 Not Reportable      * For infections other than uncomplicated UTI caused by E. coli, K. pneumoniae or P. mirabilis: Cefazolin is resistant if MIC > or = 8 mcg/mL. (Distinguishing susceptible versus intermediate for isolates with MIC < or = 4 mcg/mL requires additional testing.) For uncomplicated UTI caused by E. coli, K. pneumoniae or P. mirabilis: Cefazolin is susceptible if MIC <32 mcg/mL and predicts susceptible to the oral agents cefaclor, cefdinir, cefpodoxime, cefprozil, cefuroxime, cephalexin and loracarbef.     CEFTAZIDIME <=1 Sensitive     CEFEPIME <=1 Sensitive     CEFTRIAXONE <=1 Sensitive     CIPROFLOXACIN <=0.25 Sensitive     LEVOFLOXACIN <=0.12 Sensitive     GENTAMICIN <=1 Sensitive     IMIPENEM <=0.25 Sensitive     NITROFURANTOIN <=16 Sensitive     PIP/TAZO <=4 Sensitive     TOBRAMYCIN <=1 Sensitive     TRIMETH/SULFA* <=20 Sensitive      * For infections other than uncomplicated UTI caused by E. coli, K. pneumoniae or P. mirabilis: Cefazolin is resistant if MIC > or = 8 mcg/mL. (Distinguishing susceptible versus intermediate for isolates with MIC < or = 4 mcg/mL requires additional testing.) For uncomplicated UTI caused by E. coli, K. pneumoniae or P. mirabilis: Cefazolin is susceptible if MIC <32 mcg/mL and predicts susceptible to the oral agents cefaclor,  cefdinir, cefpodoxime, cefprozil, cefuroxime, cephalexin and loracarbef. Legend: S = Susceptible  I = Intermediate R = Resistant  NS = Not susceptible SDD = Susceptible Dose Dependent * = Not Tested  NR = Not Reported **NN = See Therapy Comments   Surgical pcr screen     Status: None   Collection Time: 09/05/23  3:41 PM   Specimen: Nasal Mucosa; Nasal Swab  Result Value Ref Range Status   MRSA, PCR NEGATIVE NEGATIVE Final   Staphylococcus aureus NEGATIVE NEGATIVE Final    Comment: (NOTE) The Xpert SA Assay (  FDA approved for NASAL specimens in patients 31 years of age and older), is one component of a comprehensive surveillance program. It is not intended to diagnose infection nor to guide or monitor treatment. Performed at [facility], [address], [place], [place] [address]   SARS Coronavirus 2 by RT PCR (hospital order, performed in [facility] hospital lab) *cepheid single result test* Anterior Nasal Swab     Status: Abnormal   Collection Time: 09/08/23 11:15 AM   Specimen: Anterior Nasal Swab  Result Value Ref Range Status   SARS Coronavirus 2 by RT PCR POSITIVE (A) NEGATIVE Final    Comment: Performed at [facility], [address], [place], [place] [address]         Radiology Studies: No results found.       Scheduled Meds:  amLODipine  2.5 mg Oral Daily   docusate sodium  100 mg Oral BID   enoxaparin (LOVENOX) injection  40 mg Subcutaneous Q24H   levothyroxine  75 mcg Oral Daily   melatonin  3 mg Oral QHS   metoprolol tartrate  12.5 mg Oral BID   nirmatrelvir/ritonavir  3 tablet Oral BID   pantoprazole  40 mg Oral Daily   Ensure Max Protein  11 oz Oral BID   traZODone  25 mg Oral QHS   Continuous Infusions:  sodium chloride 100 mL/hr at 09/10/23 0932      LOS: 5 days    Time spent: 53 minutes spent on chart review, discussion with nursing staff, consultants, updating family and interview/physical exam; more than 50% of that time  was spent in counseling and/or coordination of care.    Sarah Philips [place], DO [facility] Available via Epic secure chat 7am-7pm After these hours, please refer to coverage provider listed on [email or web address] 09/10/2023, 11:39 AM

## 2023-09-10 NOTE — TOC Progression Note (Signed)
Transition of Care [facility]) - Progression Note    Patient Details  Name: Sarah Harper MRN: [id number] Date of Birth: 08-09-1941  Transition of Care [facility] [facility]) CM/SW Contact  Lorri Frederick, LCSW Phone Number: 09/10/2023, 2:37 PM  Clinical Narrative:   [facility] bed, CSW spoke with pt sister Eunice Blase, who communicates with pt and they do want to accept.  [facility] cannot take until after 10 day covid quarantine--could accept 1/27.      Expected Discharge Plan: Skilled Nursing Facility Barriers to Discharge: English as a second language teacher, Continued Medical Work up, SNF Pending bed offer  Expected Discharge Plan and Services In-house Referral: Clinical Social Work   Post Acute Care Choice: Skilled Nursing Facility Living arrangements for the past 2 months: Single Family Home                                       Social Determinants of Health (SDOH) Interventions SDOH Screenings   Food Insecurity: No Food Insecurity (09/05/2023)  Housing: High Risk (09/05/2023)  Transportation Needs: No Transportation Needs (09/05/2023)  Utilities: At Risk (09/05/2023)  Alcohol Screen: Low Risk  (02/12/2023)  Depression (PHQ2-9): Low Risk  (08/08/2023)  Financial Resource Strain: Low Risk  (02/12/2023)  Physical Activity: Insufficiently Active (02/12/2023)  Social Connections: Moderately Integrated (09/05/2023)  Stress: No Stress Concern Present (02/12/2023)  Tobacco Use: Low Risk  (09/06/2023)    Readmission Risk Interventions     No data to display

## 2023-09-10 NOTE — Plan of Care (Signed)
  Problem: Education: Goal: Knowledge of General Education information will improve Description: Including pain rating scale, medication(s)/side effects and non-pharmacologic comfort measures Outcome: Progressing   Problem: Health Behavior/Discharge Planning: Goal: Ability to manage health-related needs will improve Outcome: Progressing   Problem: Clinical Measurements: Goal: Ability to maintain clinical measurements within normal limits will improve Outcome: Progressing Goal: Will remain free from infection Outcome: Progressing Goal: Diagnostic test results will improve Outcome: Progressing Goal: Respiratory complications will improve Outcome: Progressing Goal: Cardiovascular complication will be avoided Outcome: Progressing   Problem: Activity: Goal: Risk for activity intolerance will decrease Outcome: Progressing   Problem: Nutrition: Goal: Adequate nutrition will be maintained Outcome: Progressing   Problem: Coping: Goal: Level of anxiety will decrease Outcome: Progressing   Problem: Elimination: Goal: Will not experience complications related to bowel motility Outcome: Progressing Goal: Will not experience complications related to urinary retention Outcome: Progressing   Problem: Pain Managment: Goal: General experience of comfort will improve and/or be controlled Outcome: Progressing   Problem: Safety: Goal: Ability to remain free from injury will improve Outcome: Progressing   Problem: Skin Integrity: Goal: Risk for impaired skin integrity will decrease Outcome: Progressing   Problem: Education: Goal: Verbalization of understanding the information provided (i.e., activity precautions, restrictions, etc) will improve Outcome: Progressing Goal: Individualized Educational Video(s) Outcome: Progressing   Problem: Activity: Goal: Ability to ambulate and perform ADLs will improve Outcome: Progressing   Problem: Clinical Measurements: Goal: Postoperative  complications will be avoided or minimized Outcome: Progressing   Problem: Self-Concept: Goal: Ability to maintain and perform role responsibilities to the fullest extent possible will improve Outcome: Progressing   Problem: Pain Management: Goal: Pain level will decrease Outcome: Progressing   Problem: Education: Goal: Knowledge of risk factors and measures for prevention of condition will improve Outcome: Progressing   Problem: Coping: Goal: Psychosocial and spiritual needs will be supported Outcome: Progressing   Problem: Respiratory: Goal: Will maintain a patent airway Outcome: Progressing Goal: Complications related to the disease process, condition or treatment will be avoided or minimized Outcome: Progressing

## 2023-09-10 NOTE — Progress Notes (Signed)
Physical Therapy Treatment  Patient Details Name: Sarah Harper MRN: [id number] DOB: 1941-01-13 Today's Date: 09/10/2023   History of Present Illness Pt is an 83 y.o. female who presents to [facility] 09/05/23 after tripping and falling on R side with no LOC. Pt admitted w/ R intertrochanteric fx of R femoral neck, s/p IMN 09/06/23. PMH significant for HTN, osteopenia, CVA, syncopal episodes, TIA, DDD.    PT Comments  Pt progressing slowly towards physical therapy goals. Was able to perform transfers with up to +2 max assist. Unable to progress to gait training this date due to feeling lightheaded and "weak". Noted pt mildly orthostatic. See below for BP details. PT recommendations for post-acute rehab <3 hours/day remain appropriate.   If plan is discharge home, recommend the following: Two people to help with walking and/or transfers;Two people to help with bathing/dressing/bathroom;Assistance with cooking/housework;Assist for transportation;Help with stairs or ramp for entrance;Supervision due to cognitive status   Can travel by private vehicle     No  Equipment Recommendations  Rolling walker (2 wheels);BSC/3in1    Recommendations for Other Services       Precautions / Restrictions Precautions Precautions: Fall Restrictions Weight Bearing Restrictions Per Provider Order: Yes RLE Weight Bearing Per Provider Order: Weight bearing as tolerated     Mobility  Bed Mobility Overal bed mobility: Needs Assistance Bed Mobility: Supine to Sit, Sit to Supine     Supine to sit: Mod assist, +2 for physical assistance     General bed mobility comments: Multimodal cues for technique. Assist for R LE and trunk, as well as scooting.  Requires increased time.    Transfers Overall transfer level: Needs assistance Equipment used: Rolling walker (2 wheels), 2 person hand held assist Transfers: Sit to/from Stand, Bed to chair/wheelchair/BSC Sit to Stand: Max assist, +2 physical assistance Stand  pivot transfers: Max assist, +2 physical assistance         General transfer comment: Multi modal cues for hand placement on seated surface for safety. Attempted x2 to stand with RW, and pt reports she is "too weak". +2 assist without walker required for SPT bed>chair.    Ambulation/Gait               General Gait Details: Unable to progress to gait training at this time.   Stairs             Wheelchair Mobility     Tilt Bed    Modified Rankin (Stroke Patients Only)       Balance Overall balance assessment: Needs assistance Sitting-balance support: No upper extremity supported, Feet supported Sitting balance-Leahy Scale: Fair Sitting balance - Comments: min gaurd statically, tends to lean posteriorly Postural control: Posterior lean Standing balance support: During functional activity, Bilateral upper extremity supported Standing balance-Leahy Scale: Poor Standing balance comment: relies on BUE and external support                            Cognition Arousal: Alert Behavior During Therapy: Flat affect, Anxious Overall Cognitive Status: Within Functional Limits for tasks assessed                                          Exercises      General Comments General comments (skin integrity, edema, etc.): Mildly orthostatic but pt with increased symptoms. 146/97 in sitting, 134/62 after 3  minutes, 113/69 after transfer to the chair. 129/61 at end of session.      Pertinent Vitals/Pain Pain Assessment Pain Assessment: Faces Faces Pain Scale: Hurts little more Pain Location: back/hip Pain Descriptors / Indicators: Aching, Discomfort, Grimacing, Guarding, Operative site guarding Pain Intervention(s): Limited activity within patient's tolerance, Monitored during session, Repositioned    Home Living                          Prior Function            PT Goals (current goals can now be found in the care plan  section) Acute Rehab PT Goals Patient Stated Goal: Home at d/c PT Goal Formulation: With patient/family Time For Goal Achievement: 09/21/23 Potential to Achieve Goals: Good Progress towards PT goals: Progressing toward goals    Frequency    Min 1X/week      PT Plan      Co-evaluation              AM-PAC PT "6 Clicks" Mobility   Outcome Measure  Help needed turning from your back to your side while in a flat bed without using bedrails?: A Lot Help needed moving from lying on your back to sitting on the side of a flat bed without using bedrails?: A Lot Help needed moving to and from a bed to a chair (including a wheelchair)?: Total Help needed standing up from a chair using your arms (e.g., wheelchair or bedside chair)?: Total Help needed to walk in hospital room?: Total Help needed climbing 3-5 steps with a railing? : Total 6 Click Score: 8    End of Session Equipment Utilized During Treatment: Gait belt Activity Tolerance: Patient tolerated treatment well Patient left: in bed;with call bell/phone within reach;with bed alarm set;with family/visitor present Nurse Communication: Mobility status PT Visit Diagnosis: Unsteadiness on feet (R26.81);Pain Pain - Right/Left: Right Pain - part of body: Hip (and back)     Time: 4098-1191 PT Time Calculation (min) (ACUTE ONLY): 32 min  Charges:    $Gait Training: 23-37 mins PT General Charges $$ ACUTE PT VISIT: 1 Visit                     Conni Slipper, PT, DPT Acute Rehabilitation Services Secure Chat Preferred Office: [phone]    Sarah Harper 09/10/2023, 11:22 AM

## 2023-09-11 DIAGNOSIS — I951 Orthostatic hypotension: Secondary | ICD-10-CM

## 2023-09-11 DIAGNOSIS — S72001A Fracture of unspecified part of neck of right femur, initial encounter for closed fracture: Secondary | ICD-10-CM | POA: Diagnosis not present

## 2023-09-11 DIAGNOSIS — E871 Hypo-osmolality and hyponatremia: Secondary | ICD-10-CM | POA: Diagnosis not present

## 2023-09-11 LAB — BASIC METABOLIC PANEL
Anion gap: 8 (ref 5–15)
BUN: <5 mg/dL — ABNORMAL LOW (ref 8–23)
CO2: 25 mmol/L (ref 22–32)
Calcium: 7.9 mg/dL — ABNORMAL LOW (ref 8.9–10.3)
Chloride: 94 mmol/L — ABNORMAL LOW (ref 98–111)
Creatinine, Ser: 0.57 mg/dL (ref 0.44–1.00)
GFR, Estimated: >60 mL/min (ref >60)
Glucose, Bld: 99 mg/dL (ref 70–99)
Potassium: 3.6 mmol/L (ref 3.5–5.1)
Sodium: 127 mmol/L — ABNORMAL LOW (ref 135–145)

## 2023-09-11 MED ORDER — SODIUM CHLORIDE 0.9 % IV SOLN: INTRAVENOUS | Status: DC

## 2023-09-11 MED ORDER — ACETAMINOPHEN 325 MG PO TABS: 650.0000 mg | ORAL_TABLET | Freq: Four times a day (QID) | ORAL | Status: DC | PRN

## 2023-09-11 MED ORDER — ACETAMINOPHEN 325 MG PO TABS
650.0000 mg | ORAL_TABLET | Freq: Four times a day (QID) | ORAL | Status: DC | PRN
  Administered 2023-09-12 – 2023-09-16 (×6): 650 mg via ORAL
  Filled 2023-09-11 (×6): qty 2

## 2023-09-11 MED ORDER — POLYETHYLENE GLYCOL 3350 17 G PO PACK
17.0000 g | PACK | Freq: Every day | ORAL | Status: DC
  Administered 2023-09-11 – 2023-09-12 (×2): 17 g via ORAL
  Filled 2023-09-11 (×3): qty 1

## 2023-09-11 MED ORDER — POTASSIUM CHLORIDE CRYS ER 20 MEQ PO TBCR
40.0000 meq | EXTENDED_RELEASE_TABLET | Freq: Once | ORAL | Status: AC
  Administered 2023-09-11: 40 meq via ORAL
  Filled 2023-09-11: qty 2

## 2023-09-11 MED ORDER — SODIUM CHLORIDE 0.9 % IV BOLUS
1000.0000 mL | Freq: Once | INTRAVENOUS | Status: AC
  Administered 2023-09-11: 1000 mL via INTRAVENOUS

## 2023-09-11 NOTE — Progress Notes (Signed)
   09/11/23 2123  BiPAP/CPAP/SIPAP  $ Non-Invasive Ventilator  Non-Invasive Vent Initial  $ Face Mask Small Yes  BiPAP/CPAP/SIPAP Pt Type Adult  BiPAP/CPAP/SIPAP Resmed  Mask Type Full face mask  Mask Size Small  PEEP 5 cmH20  FiO2 (%) 28 %  Flow Rate 2 lpm  Patient Home Equipment No  Auto Titrate No  CPAP/SIPAP surface wiped down Yes  BiPAP/CPAP /SiPAP Vitals  Pulse Rate 82  Resp 18  SpO2 96 %  Bilateral Breath Sounds Clear;Diminished  MEWS Score/Color  MEWS Score 0  MEWS Score Color [place] Si

## 2023-09-11 NOTE — Progress Notes (Signed)
Occupational Therapy Treatment Patient Details Name: Sarah Harper MRN: [id number] DOB: June 19, 1941 Today's Date: 09/11/2023   History of present illness Pt is an 83 y.o. female who presents to [facility] 09/05/23 after tripping and falling on R side with no LOC. Pt admitted w/ R intertrochanteric fx of R femoral neck, s/p IMN 09/06/23.  Incidentally + COVID 1/18. PMH significant for HTN, osteopenia, CVA, syncopal episodes, TIA, DDD.   OT comments  Pt supine in bed and eager for OT session.  She requires min assist for bed mobility, but reports dizziness that worsens with prolonged sitting when upright OOB.  SBP decreased from 163 supine to 137 EOB.  +2 mod assist for safety to return supine.  Setup to max assist for ADLs.  Educated pt on increasing upright position in bed to chair position today, during session tolerated 43 degrees, but became dizzy at 50 degrees; pt voiced understanding.  Will follow acutely. Continue to recommend <3 hours/day inpatient setting.       If plan is discharge home, recommend the following:  Two people to help with walking and/or transfers;Two people to help with bathing/dressing/bathroom;Assistance with cooking/housework;Assist for transportation;Help with stairs or ramp for entrance   Equipment Recommendations  Other (comment) (defer)    Recommendations for Other Services      Precautions / Restrictions Precautions Precautions: Fall Restrictions Weight Bearing Restrictions Per Provider Order: Yes RLE Weight Bearing Per Provider Order: Weight bearing as tolerated       Mobility Bed Mobility Overal bed mobility: Needs Assistance Bed Mobility: Supine to Sit, Sit to Supine     Supine to sit: Min assist Sit to supine: Mod assist, +2 for safety/equipment   General bed mobility comments: increased time and cueing for technique, min assist for scooting forward; mod assist +2 safety to return to supine due to dizziness    Transfers                    General transfer comment: deferred due to dizziness     Balance Overall balance assessment: Needs assistance Sitting-balance support: No upper extremity supported, Feet supported Sitting balance-Leahy Scale: Fair Sitting balance - Comments: min gaurd statically, tends to lean posteriorly Postural control: Posterior lean                                 ADL either performed or assessed with clinical judgement   ADL Overall ADL's : Needs assistance/impaired     Grooming: Set up;Sitting Grooming Details (indicate cue type and reason): upright in bed         Upper Body Dressing : Sitting;Contact guard assist   Lower Body Dressing: Sitting/lateral leans;Bed level;Maximal assistance     Toilet Transfer Details (indicate cue type and reason): deferred d/t dizziness         Functional mobility during ADLs: Minimal assistance General ADL Comments: limited to EOB    Extremity/Trunk Assessment              Vision       Perception     Praxis      Cognition Arousal: Alert Behavior During Therapy: Flat affect, Anxious Overall Cognitive Status: Within Functional Limits for tasks assessed  Exercises      Shoulder Instructions       General Comments pt remains orthostatic during mobility, symptomatic and requests to return back to bed: SBP 163 supine and decreased to 137 sitting EOB    Pertinent Vitals/ Pain       Pain Assessment Pain Assessment: Faces Faces Pain Scale: Hurts little more Pain Location: back/hip Pain Descriptors / Indicators: Aching, Discomfort, Grimacing, Guarding, Operative site guarding Pain Intervention(s): Limited activity within patient's tolerance, Monitored during session, Repositioned  Home Living                                          Prior Functioning/Environment              Frequency  Min 1X/week        Progress Toward  Goals  OT Goals(current goals can now be found in the care plan section)  Progress towards OT goals: Not progressing toward goals - comment;OT to reassess next treatment (orthostatic)  Acute Rehab OT Goals Patient Stated Goal: get better OT Goal Formulation: With patient Time For Goal Achievement: 09/21/23 Potential to Achieve Goals: Good  Plan      Co-evaluation                 AM-PAC OT "6 Clicks" Daily Activity     Outcome Measure   Help from another person eating meals?: A Little Help from another person taking care of personal grooming?: A Little Help from another person toileting, which includes using toliet, bedpan, or urinal?: Total Help from another person bathing (including washing, rinsing, drying)?: A Lot Help from another person to put on and taking off regular upper body clothing?: A Little Help from another person to put on and taking off regular lower body clothing?: A Lot 6 Click Score: 14    End of Session    OT Visit Diagnosis: Other abnormalities of gait and mobility (R26.89);Pain Pain - Right/Left: Right Pain - part of body: Hip   Activity Tolerance Treatment limited secondary to medical complications (Comment) (orthostatic and symptomatic)   Patient Left in bed;with bed alarm set;with call bell/phone within reach;with family/visitor present   Nurse Communication Mobility status        Time: 0981-1914 OT Time Calculation (min): 25 min  Charges: OT General Charges $OT Visit: 1 Visit OT Treatments $Self Care/Home Management : 23-37 mins  Barry Brunner, OT Acute Rehabilitation Services Office [phone]   Chancy Milroy 09/11/2023, 1:54 PM

## 2023-09-11 NOTE — Progress Notes (Signed)
PROGRESS NOTE    TANYLA GRADNEY  AOZ:[id number] DOB: [birth year] DOA: 09/05/2023 PCP: Judy Pimple, MD    Brief Narrative:   Sarah Harper is a 83 y.o. female with past medical history significant for HTN, hypothyroidism, dyslipidemia, GERD, osteopenia, OSA on CPAP who presented to [facility] ED on 1/15 with complaint of right hip pain.  Patient reports she tripped and fell onto her right side without loss of consciousness.  She was unable to ambulate or get up after event.  EMS was called to her home where there was noticeable shortening and rotation of the right leg.  Patient denied chest pain, no dizziness, no shortness of breath, no palpitations.  Patient was transported to the ED for further evaluation and management.  In the ED, temperature 99.3 F, HR 84, RR 18, BP 166/105, SpO2 95% on room air.  WBC of 7.2, hemoglobin 12.9, platelet count 185.  Sodium 126, potassium 3.2, chloride 93, CO2 21, glucose 106, BUN 9, creatinine 0.74.  TSH 1.538.  Chest x-ray with no active cardiopulmonary disease process.  X-ray right pelvis with acute impacted, angulated, displaced intertrochanteric fracture proximal right femur.  Right femur x-ray with intertrochanteric fracture right femoral neck.  Orthopedics was consulted.  TRH consulted for admission for further evaluation management of right femur fracture.  Assessment & Plan:    Right displaced, angulated, impacted intertrochanteric fracture proximal right femur Patient presenting to the ED with acute right hip pain, inability to ambulate following mechanical fall at home.  Denies loss of consciousness or striking her head.  Imaging notable for acute impacted, angulated displaced intertrochanteric fracture of proximal right femur.  Orthopedics was consulted and patient underwent intramedullary nailing of the right hip on 09/06/2023 by Dr. Carola Frost. -- Orthopedics following, appreciate assistance -- vitamin D 25-hydroxy level 44.40 -- WBAT RLE, OOB q shift --  Lovenox for DVT prophylaxis -- Tylenol 650 mg p.o. every 6 hours as needed mild/moderate pain -- PT/OT evaluation: Recommend SNF placement; TOC consulted; needs 10 day quarantine period, plan DC on 1/27 -- Outpatient follow-up with orthopedics 2 weeks for suture removal and wound check  COVID viral infection Asymptomatic other than mild cough, no hypoxia. -- Paxlovid 300mg  PO BID x 5 days -- Airborne/contact isolation precautions  Acute postoperative blood loss anemia -- Hgb 12.9>>11.3>9.7>9.7>9.7; stable -- Transfuse for hemoglobin less than 7.0  Orthostasis Noted on physical therapy evaluation today -- IVF bolus followed by continued NS infusion today -- Supportive care, fall precautions  Hyponatremia Etiology likely secondary to SIADH versus recent use of Bactrim.  Serum osmolality 272, urine osmolality 319, urine sodium 80. -- Na 126>126>127>123>124>129>127 -- NS bolus 1L followed by 125 mL/h  -- Repeat BMP in a.m.  Hypokalemia Repleted.  Essential hypertension -- Amlodipine 2.5 mg p.o. daily -- Metoprolol tartrate 12.5 mg p.o. twice daily  Hypothyroidism TSH 1.53, within normal limits. -- Levothyroxine 75 mcg p.o. daily  History of dyslipidemia Currently not on medications prior to admission.  GERD -- Protonix 40 mg p.o. daily -- Pepcid 40 mg p.o. daily.  Heartburn/indigestion -- Carafate 1 g p.o. 3 times daily as needed esophagitis  Insomnia -- Melatonin 3mg  p.o. nightly   DVT prophylaxis: SCDs Start: 09/06/23 1107 enoxaparin (LOVENOX) injection 40 mg Start: 09/05/23 1200    Code Status: Full Code Family Communication: Updated sister present at bedside this morning  Disposition Plan:  Level of care: Med-Surg Status is: Inpatient Remains inpatient appropriate because: Pending SNF placement; need 10-day quarantine due to COVID  per facility    Consultants:  Orthopedics, Dr. Carola Frost  Procedures:  Right femur intramedullary nailing, Dr. Carola Frost  1/16  Antimicrobials:  Perioperative cefazolin   Subjective: Seen examined bedside, resting calmly.  Lying in bed.  Confusion overnight, continues with generalized weakness/fatigue.  Unable to ambulate with PT this morning due to orthostasis.  Will give IV fluid bolus followed by continued IV infusion today.  Social work reports patient needs 10-day quarantine.  Before discharging to SNF due to COVID infection.  Sister request discontinuation of the "strong" pain medications and trazodone due to confusion.  No other specific questions, concerns or complaints at this time.  Denies headache, no vision changes, no dizziness, no chest pain, no shortness of breath, no abdominal pain, no fever/chills, no diarrhea, no focal weakness, no paresthesias.  No acute events overnight per nursing staff.   Pending SNF placement after quarantine.  Per facility guidelines, medically stable for discharge once criteria met.  Objective: Vitals:   09/10/23 1506 09/10/23 2022 09/11/23 0454 09/11/23 0741  BP: (!) 130/56 (!) 152/73 (!) 173/82 (!) 170/91  Pulse: 81 85 81   Resp: 17 16 16 18   Temp: 98 F (36.7 C) 98.7 F (37.1 C) 98.4 F (36.9 C) 98.4 F (36.9 C)  TempSrc: Oral Oral Oral Oral  SpO2: 96% 97% 96% 96%  Weight:      Height:        Intake/Output Summary (Last 24 hours) at 09/11/2023 1249 Last data filed at 09/11/2023 1044 Gross per 24 hour  Intake 2490.64 ml  Output 1300 ml  Net 1190.64 ml   Filed Weights   09/06/23 0717 09/08/23 0502  Weight: 59 kg 62.6 kg    Examination:  Physical Exam: GEN: NAD, alert, oriented to person/place/time HEENT: NCAT, PERRL, EOMI, sclera clear, MMM PULM: CTAB w/o wheezes/crackles, normal respiratory effort, on room air CV: RRR w/o M/G/R GI: abd soft, NTND, NABS, no R/G/M MSK: Right hip with surgical dressings in place, clean/dry/intact without surrounding erythema/ecchymosis, no peripheral edema, muscle strength globally intact 5/5 bilateral upper/lower  extremities, neurovascularly intact NEURO: No focal neurological deficits PSYCH: normal mood/affect Integumentary: No concerning rashes/lesions/wounds noted on exposed skin surfaces.    Data Reviewed: I have personally reviewed following labs and imaging studies  CBC: Recent Labs  Lab 09/05/23 0805 09/05/23 1255 09/06/23 0527 09/07/23 0642 09/08/23 0450 09/09/23 0450 09/10/23 0452  WBC 7.2   < > 5.6 7.4 5.4 5.9 5.9  NEUTROABS 5.8  --   --   --   --   --   --   HGB 12.9   < > 12.6 11.3* 9.7* 9.7* 9.7*  HCT 38.4   < > 36.9 33.3* 28.0* 28.3* 29.3*  MCV 86.3   < > 85.6 84.7 84.6 85.2 87.5  PLT 185   < > 188 156 133* 145* 192   < > = values in this interval not displayed.   Basic Metabolic Panel: Recent Labs  Lab 09/05/23 1255 09/06/23 0527 09/07/23 1610 09/08/23 0450 09/09/23 0450 09/10/23 0452 09/11/23 0520  NA  --    < > 127* 123* 124* 129* 127*  K  --    < > 4.2 3.9 4.1 4.1 3.6  CL  --    < > 95* 92* 93* 98 94*  CO2  --    < > 24 24 23 22 25   GLUCOSE  --    < > 114* 106* 94 99 99  BUN  --    < >  12 15 14 13  <5*  CREATININE 0.78   < > 0.74 0.64 0.56 0.54 0.57  CALCIUM 8.9   < > 8.7* 8.1* 8.2* 8.3* 7.9*  MG 1.7  --  1.6* 2.0  --   --   --    < > = values in this interval not displayed.   GFR: Estimated Creatinine Clearance: 46.8 mL/min (by C-G formula based on SCr of 0.57 mg/dL). Liver Function Tests: Recent Labs  Lab 09/05/23 1255  ALBUMIN 3.8   No results for input(s): "LIPASE", "AMYLASE" in the last 168 hours. No results for input(s): "AMMONIA" in the last 168 hours. Coagulation Profile: No results for input(s): "INR", "PROTIME" in the last 168 hours. Cardiac Enzymes: No results for input(s): "CKTOTAL", "CKMB", "CKMBINDEX", "TROPONINI" in the last 168 hours. BNP (last 3 results) No results for input(s): "PROBNP" in the last 8760 hours. HbA1C: No results for input(s): "HGBA1C" in the last 72 hours. CBG: No results for input(s): "GLUCAP" in the last  168 hours. Lipid Profile: No results for input(s): "CHOL", "HDL", "LDLCALC", "TRIG", "CHOLHDL", "LDLDIRECT" in the last 72 hours. Thyroid Function Tests: No results for input(s): "TSH", "T4TOTAL", "FREET4", "T3FREE", "THYROIDAB" in the last 72 hours.  Anemia Panel: No results for input(s): "VITAMINB12", "FOLATE", "FERRITIN", "TIBC", "IRON", "RETICCTPCT" in the last 72 hours. Sepsis Labs: No results for input(s): "PROCALCITON", "LATICACIDVEN" in the last 168 hours.  Recent Results (from the past 240 hours)  Urine Culture     Status: Abnormal   Collection Time: 09/03/23 10:26 AM   Specimen: Urine  Result Value Ref Range Status   MICRO NUMBER: [id number]  Final   SPECIMEN QUALITY: Adequate  Final   Sample Source URINE  Final   STATUS: FINAL  Final   ISOLATE 1: Escherichia coli (A)  Final    Comment: 50,000-100,000 CFU/mL of Escherichia coli      Susceptibility   Escherichia coli - URINE CULTURE, REFLEX    AMOX/CLAVULANIC 8 Sensitive     AMPICILLIN >=32 Resistant     AMPICILLIN/SULBACTAM >=32 Resistant     CEFAZOLIN* <=4 Not Reportable      * For infections other than uncomplicated UTI caused by E. coli, K. pneumoniae or P. mirabilis: Cefazolin is resistant if MIC > or = 8 mcg/mL. (Distinguishing susceptible versus intermediate for isolates with MIC < or = 4 mcg/mL requires additional testing.) For uncomplicated UTI caused by E. coli, K. pneumoniae or P. mirabilis: Cefazolin is susceptible if MIC <32 mcg/mL and predicts susceptible to the oral agents cefaclor, cefdinir, cefpodoxime, cefprozil, cefuroxime, cephalexin and loracarbef.     CEFTAZIDIME <=1 Sensitive     CEFEPIME <=1 Sensitive     CEFTRIAXONE <=1 Sensitive     CIPROFLOXACIN <=0.25 Sensitive     LEVOFLOXACIN <=0.12 Sensitive     GENTAMICIN <=1 Sensitive     IMIPENEM <=0.25 Sensitive     NITROFURANTOIN <=16 Sensitive     PIP/TAZO <=4 Sensitive     TOBRAMYCIN <=1 Sensitive     TRIMETH/SULFA* <=20 Sensitive      *  For infections other than uncomplicated UTI caused by E. coli, K. pneumoniae or P. mirabilis: Cefazolin is resistant if MIC > or = 8 mcg/mL. (Distinguishing susceptible versus intermediate for isolates with MIC < or = 4 mcg/mL requires additional testing.) For uncomplicated UTI caused by E. coli, K. pneumoniae or P. mirabilis: Cefazolin is susceptible if MIC <32 mcg/mL and predicts susceptible to the oral agents cefaclor, cefdinir, cefpodoxime, cefprozil, cefuroxime, cephalexin and loracarbef.  Legend: S = Susceptible  I = Intermediate R = Resistant  NS = Not susceptible SDD = Susceptible Dose Dependent * = Not Tested  NR = Not Reported **NN = See Therapy Comments   Surgical pcr screen     Status: None   Collection Time: 09/05/23  3:41 PM   Specimen: Nasal Mucosa; Nasal Swab  Result Value Ref Range Status   MRSA, PCR NEGATIVE NEGATIVE Final   Staphylococcus aureus NEGATIVE NEGATIVE Final    Comment: (NOTE) The Xpert SA Assay (FDA approved for NASAL specimens in patients 74 years of age and older), is one component of a comprehensive surveillance program. It is not intended to diagnose infection nor to guide or monitor treatment. Performed at [facility], [address], [place], [place] [address]   SARS Coronavirus 2 by RT PCR (hospital order, performed in [facility] hospital lab) *cepheid single result test* Anterior Nasal Swab     Status: Abnormal   Collection Time: 09/08/23 11:15 AM   Specimen: Anterior Nasal Swab  Result Value Ref Range Status   SARS Coronavirus 2 by RT PCR POSITIVE (A) NEGATIVE Final    Comment: Performed at [facility], [address] [place], [place] [address]         Radiology Studies: No results found.       Scheduled Meds:  amLODipine  2.5 mg Oral Daily   docusate sodium  100 mg Oral BID   enoxaparin (LOVENOX) injection  40 mg Subcutaneous Q24H   levothyroxine  75 mcg Oral Daily   melatonin  3 mg Oral QHS    metoprolol tartrate  12.5 mg Oral BID   nirmatrelvir/ritonavir  3 tablet Oral BID   pantoprazole  40 mg Oral Daily   polyethylene glycol  17 g Oral Daily   Ensure Max Protein  11 oz Oral BID   Continuous Infusions:  sodium chloride     Followed by   sodium chloride        LOS: 6 days    Time spent: 53 minutes spent on chart review, discussion with nursing staff, consultants, updating family and interview/physical exam; more than 50% of that time was spent in counseling and/or coordination of care.    Alvira Philips [place], DO [facility] Available via Epic secure chat 7am-7pm After these hours, please refer to coverage provider listed on [email or web address] 09/11/2023, 12:49 PM

## 2023-09-12 DIAGNOSIS — E871 Hypo-osmolality and hyponatremia: Secondary | ICD-10-CM | POA: Diagnosis not present

## 2023-09-12 DIAGNOSIS — U071 COVID-19: Secondary | ICD-10-CM | POA: Diagnosis not present

## 2023-09-12 DIAGNOSIS — E876 Hypokalemia: Secondary | ICD-10-CM | POA: Diagnosis not present

## 2023-09-12 DIAGNOSIS — S72001A Fracture of unspecified part of neck of right femur, initial encounter for closed fracture: Secondary | ICD-10-CM | POA: Diagnosis not present

## 2023-09-12 LAB — BASIC METABOLIC PANEL
Anion gap: 10 (ref 5–15)
BUN: 8 mg/dL (ref 8–23)
CO2: 24 mmol/L (ref 22–32)
Calcium: 8.3 mg/dL — ABNORMAL LOW (ref 8.9–10.3)
Chloride: 90 mmol/L — ABNORMAL LOW (ref 98–111)
Creatinine, Ser: 0.54 mg/dL (ref 0.44–1.00)
GFR, Estimated: >60 mL/min (ref >60)
Glucose, Bld: 93 mg/dL (ref 70–99)
Potassium: 3.2 mmol/L — ABNORMAL LOW (ref 3.5–5.1)
Sodium: 124 mmol/L — ABNORMAL LOW (ref 135–145)

## 2023-09-12 MED ORDER — POTASSIUM CHLORIDE CRYS ER 20 MEQ PO TBCR
40.0000 meq | EXTENDED_RELEASE_TABLET | ORAL | Status: AC
  Administered 2023-09-12 (×2): 40 meq via ORAL
  Filled 2023-09-12 (×2): qty 2

## 2023-09-12 NOTE — Progress Notes (Signed)
Physical Therapy Treatment Patient Details Name: Sarah Harper MRN: [id number] DOB: 05-24-1941 Today's Date: 09/12/2023   History of Present Illness Pt is an 83 y.o. female who presents to [facility] 09/05/23 after tripping and falling on R side with no LOC. Pt admitted w/ R intertrochanteric fx of R femoral neck, s/p IMN 09/06/23.  Incidentally + COVID 1/18. PMH significant for HTN, osteopenia, CVA, syncopal episodes, TIA, DDD.    PT Comments  Patient resting in bed and reports having had a BM and needing assist to clean up. Min assist provided to roll Rt/Lt; pt able to flex Rt LE with min assist and no significant increase in pain. Total assist for pericare. Pt abel to complete supine>side>sit EOB with min assist and cues for use of bed features. Pt reports slight dizziness (BP 142/80 mmHg). Sit<>stand completed with Min assist and cues for hand placement. Pt able to sequence small steps/scoots of feet towards HOB with min assist to steady and guide walker. Pt c/o increased weak sensation and returned to sit (BP 136/72 mmHg). EOS pt returned to supine and repositioned in chair position. Will continue to progress pt as able during acute stay.    If plan is discharge home, recommend the following: Two people to help with walking and/or transfers;A lot of help with bathing/dressing/bathroom;Assistance with cooking/housework;Assist for transportation;Help with stairs or ramp for entrance   Can travel by private vehicle     No  Equipment Recommendations  Rolling walker (2 wheels);BSC/3in1    Recommendations for Other Services       Precautions / Restrictions Precautions Precautions: Fall Restrictions Weight Bearing Restrictions Per Provider Order: No RLE Weight Bearing Per Provider Order: Weight bearing as tolerated     Mobility  Bed Mobility Overal bed mobility: Needs Assistance Bed Mobility: Rolling, Sidelying to Sit, Sit to Supine Rolling: Min assist, Used rails Sidelying to sit: Min  assist, HOB elevated, Used rails   Sit to supine: Min assist   General bed mobility comments: cues for sequencing Roll Rt/Lt for pericare and for use of bed rail to raise trunk. min assist to complete side>sit EOB and return to supine.    Transfers Overall transfer level: Needs assistance Equipment used: Rolling walker (2 wheels), 2 person hand held assist Transfers: Sit to/from Stand Sit to Stand: Min assist           General transfer comment: Cues for hand placement and pt completed sit<>stand with RW. min assist to rise and steady in standing. smal lateral steps/scoots taken at EOB to move superior towards HOB.    Ambulation/Gait                   Stairs             Wheelchair Mobility     Tilt Bed    Modified Rankin (Stroke Patients Only)       Balance Overall balance assessment: Needs assistance Sitting-balance support: No upper extremity supported, Feet supported Sitting balance-Leahy Scale: Good     Standing balance support: Bilateral upper extremity supported, Reliant on assistive device for balance Standing balance-Leahy Scale: Poor                              Cognition Arousal: Alert Behavior During Therapy: WFL for tasks assessed/performed Overall Cognitive Status: Within Functional Limits for tasks assessed  Exercises      General Comments        Pertinent Vitals/Pain Pain Assessment Pain Assessment: Faces Faces Pain Scale: Hurts little more Pain Location: back/hip Pain Descriptors / Indicators: Aching, Discomfort, Grimacing, Guarding, Operative site guarding Pain Intervention(s): Monitored during session, Limited activity within patient's tolerance, Repositioned    Home Living                          Prior Function            PT Goals (current goals can now be found in the care plan section) Acute Rehab PT Goals Patient Stated Goal:  Home at d/c PT Goal Formulation: With patient/family Time For Goal Achievement: 09/21/23 Potential to Achieve Goals: Good Progress towards PT goals: Progressing toward goals    Frequency    Min 1X/week      PT Plan      Co-evaluation              AM-PAC PT "6 Clicks" Mobility   Outcome Measure  Help needed turning from your back to your side while in a flat bed without using bedrails?: A Little Help needed moving from lying on your back to sitting on the side of a flat bed without using bedrails?: A Little Help needed moving to and from a bed to a chair (including a wheelchair)?: A Lot Help needed standing up from a chair using your arms (e.g., wheelchair or bedside chair)?: A Little Help needed to walk in hospital room?: Total Help needed climbing 3-5 steps with a railing? : Total 6 Click Score: 13    End of Session Equipment Utilized During Treatment: Gait belt Activity Tolerance: Patient tolerated treatment well Patient left: in bed;with call bell/phone within reach;with bed alarm set Nurse Communication: Mobility status PT Visit Diagnosis: Unsteadiness on feet (R26.81);Pain Pain - Right/Left: Right Pain - part of body: Hip (hip and back)     Time: 7628-3151 PT Time Calculation (min) (ACUTE ONLY): 29 min  Charges:    $Therapeutic Activity: 23-37 mins PT General Charges $$ ACUTE PT VISIT: 1 Visit                     Wynn Maudlin, DPT Acute Rehabilitation Services Office [phone]  09/12/23 4:49 PM

## 2023-09-12 NOTE — Progress Notes (Signed)
PROGRESS NOTE    RIVER VIOLET  KGM:[id number] DOB: [birth year] DOA: 09/05/2023 PCP: Judy Pimple, MD    Brief Narrative:   Sarah Harper is a 83 y.o. female with past medical history significant for HTN, hypothyroidism, dyslipidemia, GERD, osteopenia, OSA on CPAP who presented to [facility] ED on 1/15 with complaint of right hip pain.  Patient reports she tripped and fell onto her right side without loss of consciousness.  She was unable to ambulate or get up after event.  EMS was called to her home where there was noticeable shortening and rotation of the right leg.  Patient denied chest pain, no dizziness, no shortness of breath, no palpitations.  Patient was transported to the ED for further evaluation and management.  In the ED, temperature 99.3 F, HR 84, RR 18, BP 166/105, SpO2 95% on room air.  WBC of 7.2, hemoglobin 12.9, platelet count 185.  Sodium 126, potassium 3.2, chloride 93, CO2 21, glucose 106, BUN 9, creatinine 0.74.  TSH 1.538.  Chest x-ray with no active cardiopulmonary disease process.  X-ray right pelvis with acute impacted, angulated, displaced intertrochanteric fracture proximal right femur.  Right femur x-ray with intertrochanteric fracture right femoral neck.  Orthopedics was consulted.  TRH consulted for admission for further evaluation management of right femur fracture.  Assessment & Plan:    Right displaced, angulated, impacted intertrochanteric fracture proximal right femur Patient presenting to the ED with acute right hip pain, inability to ambulate following mechanical fall at home.  Denies loss of consciousness or striking her head.  Imaging notable for acute impacted, angulated displaced intertrochanteric fracture of proximal right femur.  Orthopedics was consulted and patient underwent intramedullary nailing of the right hip on 09/06/2023 by Dr. Carola Frost. -- vitamin D 25-hydroxy level 44.40 -- WBAT RLE, OOB q shift -- Lovenox for DVT prophylaxis -- Tylenol 650 mg  p.o. every 6 hours as needed mild/moderate pain -- PT/OT evaluation: Recommend SNF placement; TOC consulted; needs 10 day quarantine period, plan DC on 1/27 -- Outpatient follow-up with orthopedics 2 weeks for suture removal and wound check  COVID viral infection Asymptomatic other than mild cough, no hypoxia. -- Paxlovid 300mg  PO BID x 5 days -- Airborne/contact isolation precautions  Acute postoperative blood loss anemia -- Hgb 12.9>>11.3>9.7>9.7>9.7; stable -- Transfuse for hemoglobin less than 7.0  Hypokalemia Potassium 3.2, will replete. -- Repeat electrolytes in a.m. to include magnesium  Orthostasis Noted on physical therapy evaluation today -- IVF bolus followed by continued NS infusion today -- Supportive care, fall precautions  Hyponatremia Etiology likely secondary to SIADH versus recent use of Bactrim.  Serum osmolality 272, urine osmolality 319, urine sodium 80. -- Na 126>126>127>123>124>129>127>124 -- Stop IV fluids today -- Repeat BMP in a.m.; if sodium continues to downtrend may need to repeat urine electrolytes/serum osmolality tomorrow  Hypokalemia Repleted.  Essential hypertension -- Amlodipine 2.5 mg p.o. daily -- Metoprolol tartrate 12.5 mg p.o. twice daily  Hypothyroidism TSH 1.53, within normal limits. -- Levothyroxine 75 mcg p.o. daily  History of dyslipidemia Currently not on medications prior to admission.  GERD -- Protonix 40 mg p.o. daily -- Pepcid 40 mg p.o. daily.  Heartburn/indigestion -- Carafate 1 g p.o. 3 times daily as needed esophagitis  Insomnia -- Melatonin 3mg  p.o. nightly   DVT prophylaxis: SCDs Start: 09/06/23 1107 enoxaparin (LOVENOX) injection 40 mg Start: 09/05/23 1200    Code Status: Full Code Family Communication: Updated sister present at bedside this morning  Disposition Plan:  Level of  care: Med-Surg Status is: Inpatient Remains inpatient appropriate because: Pending SNF placement; need 10-day quarantine due  to COVID per facility    Consultants:  Orthopedics, Dr. Carola Frost  Procedures:  Right femur intramedullary nailing, Dr. Carola Frost 1/16  Antimicrobials:  Perioperative cefazolin   Subjective: Seen examined bedside, resting calmly.  Lying in bed.  No further confusion after discontinued narcotic pain medications, Ativan.  Sister present at bedside.  No other specific questions, concerns or complaints at this time.  Denies headache, no vision changes, no dizziness, no chest pain, no shortness of breath, no abdominal pain, no fever/chills, no diarrhea, no focal weakness, no paresthesias.  No acute events overnight per nursing staff.   Pending SNF placement after quarantine per facility guidelines, medically stable for discharge once criteria met.  Objective: Vitals:   09/11/23 2123 09/11/23 2246 09/12/23 0611 09/12/23 0940  BP:  (!) 159/99 (!) 151/81 127/64  Pulse: 82 96 84 78  Resp: 18 18 18 18   Temp:  97.6 F (36.4 C) 98 F (36.7 C) 98 F (36.7 C)  TempSrc:  Oral Oral Oral  SpO2: 96% 99% 98% 98%  Weight:      Height:        Intake/Output Summary (Last 24 hours) at 09/12/2023 1201 Last data filed at 09/12/2023 0631 Gross per 24 hour  Intake 1132.42 ml  Output 1000 ml  Net 132.42 ml   Filed Weights   09/06/23 0717 09/08/23 0502  Weight: 59 kg 62.6 kg    Examination:  Physical Exam: GEN: NAD, alert, oriented to person/place/time HEENT: NCAT, PERRL, EOMI, sclera clear, MMM PULM: CTAB w/o wheezes/crackles, normal respiratory effort, on room air CV: RRR w/o M/G/R GI: abd soft, NTND, NABS, no R/G/M MSK: Right hip with surgical dressings in place, clean/dry/intact without surrounding erythema/ecchymosis, no peripheral edema, muscle strength globally intact 5/5 bilateral upper/lower extremities, neurovascularly intact NEURO: No focal neurological deficits PSYCH: normal mood/affect Integumentary: No concerning rashes/lesions/wounds noted on exposed skin surfaces.    Data  Reviewed: I have personally reviewed following labs and imaging studies  CBC: Recent Labs  Lab 09/06/23 0527 09/07/23 0642 09/08/23 0450 09/09/23 0450 09/10/23 0452  WBC 5.6 7.4 5.4 5.9 5.9  HGB 12.6 11.3* 9.7* 9.7* 9.7*  HCT 36.9 33.3* 28.0* 28.3* 29.3*  MCV 85.6 84.7 84.6 85.2 87.5  PLT 188 156 133* 145* 192   Basic Metabolic Panel: Recent Labs  Lab 09/05/23 1255 09/06/23 0527 09/07/23 0642 09/08/23 0450 09/09/23 0450 09/10/23 0452 09/11/23 0520 09/12/23 0537  NA  --    < > 127* 123* 124* 129* 127* 124*  K  --    < > 4.2 3.9 4.1 4.1 3.6 3.2*  CL  --    < > 95* 92* 93* 98 94* 90*  CO2  --    < > 24 24 23 22 25 24   GLUCOSE  --    < > 114* 106* 94 99 99 93  BUN  --    < > 12 15 14 13  <5* 8  CREATININE 0.78   < > 0.74 0.64 0.56 0.54 0.57 0.54  CALCIUM 8.9   < > 8.7* 8.1* 8.2* 8.3* 7.9* 8.3*  MG 1.7  --  1.6* 2.0  --   --   --   --    < > = values in this interval not displayed.   GFR: Estimated Creatinine Clearance: 46.8 mL/min (by C-G formula based on SCr of 0.54 mg/dL). Liver Function Tests: Recent Labs  Lab  09/05/23 1255  ALBUMIN 3.8   No results for input(s): "LIPASE", "AMYLASE" in the last 168 hours. No results for input(s): "AMMONIA" in the last 168 hours. Coagulation Profile: No results for input(s): "INR", "PROTIME" in the last 168 hours. Cardiac Enzymes: No results for input(s): "CKTOTAL", "CKMB", "CKMBINDEX", "TROPONINI" in the last 168 hours. BNP (last 3 results) No results for input(s): "PROBNP" in the last 8760 hours. HbA1C: No results for input(s): "HGBA1C" in the last 72 hours. CBG: No results for input(s): "GLUCAP" in the last 168 hours. Lipid Profile: No results for input(s): "CHOL", "HDL", "LDLCALC", "TRIG", "CHOLHDL", "LDLDIRECT" in the last 72 hours. Thyroid Function Tests: No results for input(s): "TSH", "T4TOTAL", "FREET4", "T3FREE", "THYROIDAB" in the last 72 hours.  Anemia Panel: No results for input(s): "VITAMINB12", "FOLATE",  "FERRITIN", "TIBC", "IRON", "RETICCTPCT" in the last 72 hours. Sepsis Labs: No results for input(s): "PROCALCITON", "LATICACIDVEN" in the last 168 hours.  Recent Results (from the past 240 hours)  Urine Culture     Status: Abnormal   Collection Time: 09/03/23 10:26 AM   Specimen: Urine  Result Value Ref Range Status   MICRO NUMBER: [id number]  Final   SPECIMEN QUALITY: Adequate  Final   Sample Source URINE  Final   STATUS: FINAL  Final   ISOLATE 1: Escherichia coli (A)  Final    Comment: 50,000-100,000 CFU/mL of Escherichia coli      Susceptibility   Escherichia coli - URINE CULTURE, REFLEX    AMOX/CLAVULANIC 8 Sensitive     AMPICILLIN >=32 Resistant     AMPICILLIN/SULBACTAM >=32 Resistant     CEFAZOLIN* <=4 Not Reportable      * For infections other than uncomplicated UTI caused by E. coli, K. pneumoniae or P. mirabilis: Cefazolin is resistant if MIC > or = 8 mcg/mL. (Distinguishing susceptible versus intermediate for isolates with MIC < or = 4 mcg/mL requires additional testing.) For uncomplicated UTI caused by E. coli, K. pneumoniae or P. mirabilis: Cefazolin is susceptible if MIC <32 mcg/mL and predicts susceptible to the oral agents cefaclor, cefdinir, cefpodoxime, cefprozil, cefuroxime, cephalexin and loracarbef.     CEFTAZIDIME <=1 Sensitive     CEFEPIME <=1 Sensitive     CEFTRIAXONE <=1 Sensitive     CIPROFLOXACIN <=0.25 Sensitive     LEVOFLOXACIN <=0.12 Sensitive     GENTAMICIN <=1 Sensitive     IMIPENEM <=0.25 Sensitive     NITROFURANTOIN <=16 Sensitive     PIP/TAZO <=4 Sensitive     TOBRAMYCIN <=1 Sensitive     TRIMETH/SULFA* <=20 Sensitive      * For infections other than uncomplicated UTI caused by E. coli, K. pneumoniae or P. mirabilis: Cefazolin is resistant if MIC > or = 8 mcg/mL. (Distinguishing susceptible versus intermediate for isolates with MIC < or = 4 mcg/mL requires additional testing.) For uncomplicated UTI caused by E. coli, K. pneumoniae or  P. mirabilis: Cefazolin is susceptible if MIC <32 mcg/mL and predicts susceptible to the oral agents cefaclor, cefdinir, cefpodoxime, cefprozil, cefuroxime, cephalexin and loracarbef. Legend: S = Susceptible  I = Intermediate R = Resistant  NS = Not susceptible SDD = Susceptible Dose Dependent * = Not Tested  NR = Not Reported **NN = See Therapy Comments   Surgical pcr screen     Status: None   Collection Time: 09/05/23  3:41 PM   Specimen: Nasal Mucosa; Nasal Swab  Result Value Ref Range Status   MRSA, PCR NEGATIVE NEGATIVE Final   Staphylococcus aureus NEGATIVE NEGATIVE Final  Comment: (NOTE) The Xpert SA Assay (FDA approved for NASAL specimens in patients 47 years of age and older), is one component of a comprehensive surveillance program. It is not intended to diagnose infection nor to guide or monitor treatment. Performed at [facility] Lab, [address], [place], [place] [address]   SARS Coronavirus 2 by RT PCR (hospital order, performed in [facility] hospital lab) *cepheid single result test* Anterior Nasal Swab     Status: Abnormal   Collection Time: 09/08/23 11:15 AM   Specimen: Anterior Nasal Swab  Result Value Ref Range Status   SARS Coronavirus 2 by RT PCR POSITIVE (A) NEGATIVE Final    Comment: Performed at [facility] Lab, [address] [place], [place] [address]         Radiology Studies: No results found.       Scheduled Meds:  amLODipine  2.5 mg Oral Daily   docusate sodium  100 mg Oral BID   enoxaparin (LOVENOX) injection  40 mg Subcutaneous Q24H   levothyroxine  75 mcg Oral Daily   melatonin  3 mg Oral QHS   metoprolol tartrate  12.5 mg Oral BID   nirmatrelvir/ritonavir  3 tablet Oral BID   pantoprazole  40 mg Oral Daily   polyethylene glycol  17 g Oral Daily   potassium chloride  40 mEq Oral Q4H   Ensure Max Protein  11 oz Oral BID   Continuous Infusions:      LOS: 7 days    Time spent: 53 minutes spent on chart  review, discussion with nursing staff, consultants, updating family and interview/physical exam; more than 50% of that time was spent in counseling and/or coordination of care.    Alvira Philips [place], DO [facility] Available via Epic secure chat 7am-7pm After these hours, please refer to coverage provider listed on [email or web address] 09/12/2023, 12:01 PM

## 2023-09-13 DIAGNOSIS — U071 COVID-19: Secondary | ICD-10-CM | POA: Insufficient documentation

## 2023-09-13 DIAGNOSIS — D500 Iron deficiency anemia secondary to blood loss (chronic): Secondary | ICD-10-CM | POA: Insufficient documentation

## 2023-09-13 DIAGNOSIS — S72001A Fracture of unspecified part of neck of right femur, initial encounter for closed fracture: Secondary | ICD-10-CM | POA: Diagnosis not present

## 2023-09-13 DIAGNOSIS — D62 Acute posthemorrhagic anemia: Secondary | ICD-10-CM | POA: Insufficient documentation

## 2023-09-13 HISTORY — DX: Iron deficiency anemia secondary to blood loss (chronic): D50.0

## 2023-09-13 LAB — BASIC METABOLIC PANEL
Anion gap: 9 (ref 5–15)
BUN: 9 mg/dL (ref 8–23)
CO2: 25 mmol/L (ref 22–32)
Calcium: 8.4 mg/dL — ABNORMAL LOW (ref 8.9–10.3)
Chloride: 95 mmol/L — ABNORMAL LOW (ref 98–111)
Creatinine, Ser: 0.53 mg/dL (ref 0.44–1.00)
GFR, Estimated: >60 mL/min (ref >60)
Glucose, Bld: 96 mg/dL (ref 70–99)
Potassium: 3.4 mmol/L — ABNORMAL LOW (ref 3.5–5.1)
Sodium: 129 mmol/L — ABNORMAL LOW (ref 135–145)

## 2023-09-13 LAB — MAGNESIUM: Magnesium: 1.7 mg/dL (ref 1.7–2.4)

## 2023-09-13 LAB — CBC
HCT: 29.5 % — ABNORMAL LOW (ref 36.0–46.0)
Hemoglobin: 10 g/dL — ABNORMAL LOW (ref 12.0–15.0)
MCH: 28.8 pg (ref 26.0–34.0)
MCHC: 33.9 g/dL (ref 30.0–36.0)
MCV: 85 fL (ref 80.0–100.0)
Platelets: 340 10*3/uL (ref 150–400)
RBC: 3.47 MIL/uL — ABNORMAL LOW (ref 3.87–5.11)
RDW: 13.6 % (ref 11.5–15.5)
WBC: 7.5 10*3/uL (ref 4.0–10.5)
nRBC: 0 % (ref 0.0–0.2)

## 2023-09-13 LAB — OSMOLALITY: Osmolality: 273 mosm/kg — ABNORMAL LOW (ref 275–295)

## 2023-09-13 MED ORDER — LOPERAMIDE HCL 2 MG PO CAPS
2.0000 mg | ORAL_CAPSULE | ORAL | Status: DC | PRN
  Administered 2023-09-13 – 2023-09-14 (×3): 2 mg via ORAL
  Filled 2023-09-13 (×3): qty 1

## 2023-09-13 MED ORDER — LORAZEPAM 0.5 MG PO TABS
0.5000 mg | ORAL_TABLET | Freq: Two times a day (BID) | ORAL | Status: DC | PRN
  Administered 2023-09-13 – 2023-09-17 (×8): 0.5 mg via ORAL
  Filled 2023-09-13 (×8): qty 1

## 2023-09-13 MED ORDER — ALPRAZOLAM 0.25 MG PO TABS: 0.2500 mg | ORAL_TABLET | Freq: Two times a day (BID) | ORAL | Status: DC | PRN

## 2023-09-13 MED ORDER — POTASSIUM CHLORIDE CRYS ER 20 MEQ PO TBCR
40.0000 meq | EXTENDED_RELEASE_TABLET | Freq: Once | ORAL | Status: AC
  Administered 2023-09-13: 40 meq via ORAL
  Filled 2023-09-13: qty 2

## 2023-09-13 NOTE — Progress Notes (Deleted)
   09/13/23 0012  BiPAP/CPAP/SIPAP  Reason BIPAP/CPAP not in use Non-compliant (Refused)

## 2023-09-13 NOTE — Plan of Care (Signed)
  Problem: Education: Goal: Knowledge of General Education information will improve Description: Including pain rating scale, medication(s)/side effects and non-pharmacologic comfort measures Outcome: Progressing   Problem: Health Behavior/Discharge Planning: Goal: Ability to manage health-related needs will improve Outcome: Progressing   Problem: Clinical Measurements: Goal: Ability to maintain clinical measurements within normal limits will improve Outcome: Progressing Goal: Will remain free from infection Outcome: Progressing Goal: Diagnostic test results will improve Outcome: Progressing Goal: Respiratory complications will improve Outcome: Progressing Goal: Cardiovascular complication will be avoided Outcome: Progressing   Problem: Activity: Goal: Risk for activity intolerance will decrease Outcome: Progressing   Problem: Nutrition: Goal: Adequate nutrition will be maintained Outcome: Progressing   Problem: Coping: Goal: Level of anxiety will decrease Outcome: Progressing   Problem: Elimination: Goal: Will not experience complications related to bowel motility Outcome: Progressing Goal: Will not experience complications related to urinary retention Outcome: Progressing   Problem: Pain Managment: Goal: General experience of comfort will improve and/or be controlled Outcome: Progressing   Problem: Safety: Goal: Ability to remain free from injury will improve Outcome: Progressing   Problem: Skin Integrity: Goal: Risk for impaired skin integrity will decrease Outcome: Progressing   Problem: Education: Goal: Verbalization of understanding the information provided (i.e., activity precautions, restrictions, etc) will improve Outcome: Progressing Goal: Individualized Educational Video(s) Outcome: Progressing   Problem: Activity: Goal: Ability to ambulate and perform ADLs will improve Outcome: Progressing   Problem: Clinical Measurements: Goal: Postoperative  complications will be avoided or minimized Outcome: Progressing   Problem: Self-Concept: Goal: Ability to maintain and perform role responsibilities to the fullest extent possible will improve Outcome: Progressing   Problem: Pain Management: Goal: Pain level will decrease Outcome: Progressing   Problem: Education: Goal: Knowledge of risk factors and measures for prevention of condition will improve Outcome: Progressing   Problem: Coping: Goal: Psychosocial and spiritual needs will be supported Outcome: Progressing   Problem: Respiratory: Goal: Will maintain a patent airway Outcome: Progressing Goal: Complications related to the disease process, condition or treatment will be avoided or minimized Outcome: Progressing

## 2023-09-13 NOTE — TOC Progression Note (Addendum)
Transition of Care [facility]) - Progression Note    Patient Details  Name: Sarah Harper MRN: [id number] Date of Birth: 04/26/1941  Transition of Care [facility] CM/SW Contact  Lorri Frederick, LCSW Phone Number: 09/13/2023, 1:43 PM  Clinical Narrative:   CSW confirmed with [facility] that they can receive pt after covid quarantine 1/27.    Angela/CMA will start aetna auth for start date 1/27.    1500: message from [facility] [place]: Cert# [id number], 1/27-2/2.     Expected Discharge Plan: Skilled Nursing Facility Barriers to Discharge: English as a second language teacher, Continued Medical Work up, SNF Pending bed offer  Expected Discharge Plan and Services In-house Referral: Clinical Social Work   Post Acute Care Choice: Skilled Nursing Facility Living arrangements for the past 2 months: Single Family Home                                       Social Determinants of Health (SDOH) Interventions SDOH Screenings   Food Insecurity: No Food Insecurity (09/05/2023)  Housing: High Risk (09/05/2023)  Transportation Needs: No Transportation Needs (09/05/2023)  Utilities: At Risk (09/05/2023)  Alcohol Screen: Low Risk  (02/12/2023)  Depression (PHQ2-9): Low Risk  (08/08/2023)  Financial Resource Strain: Low Risk  (02/12/2023)  Physical Activity: Insufficiently Active (02/12/2023)  Social Connections: Moderately Integrated (09/05/2023)  Stress: No Stress Concern Present (02/12/2023)  Tobacco Use: Low Risk  (09/06/2023)    Readmission Risk Interventions     No data to display

## 2023-09-13 NOTE — Progress Notes (Signed)
   09/13/23 0012  BiPAP/CPAP/SIPAP  $ Non-Invasive Home Ventilator  Subsequent  $ Face Mask Small Yes  BiPAP/CPAP/SIPAP Pt Type Adult  BiPAP/CPAP/SIPAP Resmed  Mask Type Full face mask  Mask Size Small  PEEP 5 cmH20  Flow Rate 2 lpm  Patient Home Equipment No  Auto Titrate No  CPAP/SIPAP surface wiped down Yes

## 2023-09-13 NOTE — Progress Notes (Signed)
PROGRESS NOTE    Sarah Harper  WUJ:[id number] DOB: [birth year] DOA: 09/05/2023 PCP: Judy Pimple, MD     Brief Narrative:  Sarah Harper is a 83 y.o. female with past medical history significant for HTN, hypothyroidism, dyslipidemia, GERD, osteopenia, OSA on CPAP who presented to [facility] ED on 1/15 with complaint of right hip pain.  Patient reports she tripped and fell onto her right side without loss of consciousness.  She was unable to ambulate or get up after event.  EMS was called to her home where there was noticeable shortening and rotation of the right leg.  Patient denied chest pain, no dizziness, no shortness of breath, no palpitations.  Patient was transported to the ED for further evaluation and management.   In the ED, temperature 99.3 F, HR 84, RR 18, BP 166/105, SpO2 95% on room air.  WBC of 7.2, hemoglobin 12.9, platelet count 185.  Sodium 126, potassium 3.2, chloride 93, CO2 21, glucose 106, BUN 9, creatinine 0.74.  TSH 1.538.  Chest x-ray with no active cardiopulmonary disease process.  X-ray right pelvis with acute impacted, angulated, displaced intertrochanteric fracture proximal right femur.  Right femur x-ray with intertrochanteric fracture right femoral neck.  Orthopedics was consulted.  TRH consulted for admission for further evaluation management of right femur fracture.  Patient underwent intramedullary nailing of the right hip 09/06/2023 by Dr. Carola Frost.  She tested positive for COVID on 09/08/2023 and is currently under isolation until she can discharge to skilled nursing facility.  New events last 24 hours / Subjective: Feeling anxious.  Asking for anxiety medicine, has taken Ativan in the past.  Also with some pain.  Assessment & Plan:   Principal Problem:   Closed right hip fracture (HCC) Active Problems:   Hypothyroidism   GERD   Hyperlipidemia   OSA (obstructive sleep apnea)   GAD (generalized anxiety disorder)   Fall at home   Hypertension, essential    COVID-19 virus infection   ABLA (acute blood loss anemia)  Right displaced, angulated, impacted intertrochanteric fracture proximal right femur -Following mechanical fall at home -Status post intramedullary nailing of the right hip on 09/06/2023 by Dr. Carola Frost -WBAT RLE -Pain control, Lovenox for DVT prophylaxis -SNF placement pending, can discharge 1/27 -Follow-up orthopedic surgery outpatient in 2 weeks  COVID -Completed 5 days of Paxlovid -Airborne isolation -Can discharge to [facility] 1/27  Acute postop blood loss anemia -Stable  Hypoosmolar hyponatremia -Likely secondary to SIADH versus recent use of Bactrim -Sodium improved  Hypokalemia -Replace  Hypertension -Amlodipine, metoprolol  Hypothyroidism -Synthroid  GERD -Protonix, Pepcid, Carafate  Anxiety -Ativan as needed  DVT prophylaxis:  SCDs Start: 09/06/23 1107 enoxaparin (LOVENOX) injection 40 mg Start: 09/05/23 1200  Code Status: Full code Family Communication: None at bedside Disposition Plan: SNF Status is: Inpatient Remains inpatient appropriate because: Currently under isolation due to COVID.  Can discharge to SNF once isolation completed on 1/27    Antimicrobials:  Anti-infectives (From admission, onward)    Start     Dose/Rate Route Frequency Ordered Stop   09/08/23 2200  nirmatrelvir/ritonavir (PAXLOVID) 3 tablet        3 tablet Oral 2 times daily 09/08/23 1627 09/13/23 1028   09/06/23 1200  ceFAZolin (ANCEF) IVPB 2g/100 mL premix        2 g 200 mL/hr over 30 Minutes Intravenous Every 6 hours 09/06/23 1106 09/06/23 1915   09/06/23 0730  ceFAZolin (ANCEF) IVPB 2g/100 mL premix  2 g 200 mL/hr over 30 Minutes Intravenous On call to O.R. 09/06/23 0728 09/06/23 0839   09/05/23 1215  sulfamethoxazole-trimethoprim (BACTRIM DS) 800-160 MG per tablet 1 tablet        1 tablet Oral 2 times daily 09/05/23 1202 09/05/23 2208        Objective: Vitals:   09/12/23 2000 09/12/23 2228 09/13/23 0400  09/13/23 0816  BP: (!) 151/78 (!) 151/78 (!) 148/76 (!) 156/80  Pulse: 85 85 74 85  Resp: 19  18 18   Temp: 98.9 F (37.2 C)  98.5 F (36.9 C) 98.2 F (36.8 C)  TempSrc: Oral  Oral Oral  SpO2: 96%  98% 98%  Weight:      Height:        Intake/Output Summary (Last 24 hours) at 09/13/2023 1052 Last data filed at 09/12/2023 2000 Gross per 24 hour  Intake --  Output 500 ml  Net -500 ml   Filed Weights   09/06/23 0717 09/08/23 0502  Weight: 59 kg 62.6 kg    Examination:  General exam: Appears calm and comfortable  Respiratory system: Respiratory effort normal. No respiratory distress. No conversational dyspnea.  Central nervous system: Alert and oriented Extremities: Symmetric in appearance  Psychiatry: Judgement and insight appear normal. Mood & affect appropriate.   Data Reviewed: I have personally reviewed following labs and imaging studies  CBC: Recent Labs  Lab 09/07/23 0642 09/08/23 0450 09/09/23 0450 09/10/23 0452 09/13/23 0604  WBC 7.4 5.4 5.9 5.9 7.5  HGB 11.3* 9.7* 9.7* 9.7* 10.0*  HCT 33.3* 28.0* 28.3* 29.3* 29.5*  MCV 84.7 84.6 85.2 87.5 85.0  PLT 156 133* 145* 192 340   Basic Metabolic Panel: Recent Labs  Lab 09/07/23 0642 09/08/23 0450 09/09/23 0450 09/10/23 0452 09/11/23 0520 09/12/23 0537 09/13/23 0604  NA 127* 123* 124* 129* 127* 124* 129*  K 4.2 3.9 4.1 4.1 3.6 3.2* 3.4*  CL 95* 92* 93* 98 94* 90* 95*  CO2 24 24 23 22 25 24 25   GLUCOSE 114* 106* 94 99 99 93 96  BUN 12 15 14 13  <5* 8 9  CREATININE 0.74 0.64 0.56 0.54 0.57 0.54 0.53  CALCIUM 8.7* 8.1* 8.2* 8.3* 7.9* 8.3* 8.4*  MG 1.6* 2.0  --   --   --   --  1.7   GFR: Estimated Creatinine Clearance: 46.8 mL/min (by C-G formula based on SCr of 0.53 mg/dL). Liver Function Tests: No results for input(s): "AST", "ALT", "ALKPHOS", "BILITOT", "PROT", "ALBUMIN" in the last 168 hours. No results for input(s): "LIPASE", "AMYLASE" in the last 168 hours. No results for input(s): "AMMONIA" in the  last 168 hours. Coagulation Profile: No results for input(s): "INR", "PROTIME" in the last 168 hours. Cardiac Enzymes: No results for input(s): "CKTOTAL", "CKMB", "CKMBINDEX", "TROPONINI" in the last 168 hours. BNP (last 3 results) No results for input(s): "PROBNP" in the last 8760 hours. HbA1C: No results for input(s): "HGBA1C" in the last 72 hours. CBG: No results for input(s): "GLUCAP" in the last 168 hours. Lipid Profile: No results for input(s): "CHOL", "HDL", "LDLCALC", "TRIG", "CHOLHDL", "LDLDIRECT" in the last 72 hours. Thyroid Function Tests: No results for input(s): "TSH", "T4TOTAL", "FREET4", "T3FREE", "THYROIDAB" in the last 72 hours. Anemia Panel: No results for input(s): "VITAMINB12", "FOLATE", "FERRITIN", "TIBC", "IRON", "RETICCTPCT" in the last 72 hours. Sepsis Labs: No results for input(s): "PROCALCITON", "LATICACIDVEN" in the last 168 hours.  Recent Results (from the past 240 hours)  Surgical pcr screen     Status: None  Collection Time: 09/05/23  3:41 PM   Specimen: Nasal Mucosa; Nasal Swab  Result Value Ref Range Status   MRSA, PCR NEGATIVE NEGATIVE Final   Staphylococcus aureus NEGATIVE NEGATIVE Final    Comment: (NOTE) The Xpert SA Assay (FDA approved for NASAL specimens in patients 14 years of age and older), is one component of a comprehensive surveillance program. It is not intended to diagnose infection nor to guide or monitor treatment. Performed at [facility], [address] [place], [place] [address]   SARS Coronavirus 2 by RT PCR (hospital order, performed in [facility] hospital lab) *cepheid single result test* Anterior Nasal Swab     Status: Abnormal   Collection Time: 09/08/23 11:15 AM   Specimen: Anterior Nasal Swab  Result Value Ref Range Status   SARS Coronavirus 2 by RT PCR POSITIVE (A) NEGATIVE Final    Comment: Performed at [facility], [address], [place], [place] [address]      Radiology Studies: No results  found.    Scheduled Meds:  amLODipine  2.5 mg Oral Daily   docusate sodium  100 mg Oral BID   enoxaparin (LOVENOX) injection  40 mg Subcutaneous Q24H   levothyroxine  75 mcg Oral Daily   melatonin  3 mg Oral QHS   metoprolol tartrate  12.5 mg Oral BID   pantoprazole  40 mg Oral Daily   polyethylene glycol  17 g Oral Daily   potassium chloride  40 mEq Oral Once   Ensure Max Protein  11 oz Oral BID   Continuous Infusions:   LOS: 8 days   Time spent: 25 minutes   Noralee Stain, DO [facility] 09/13/2023, 10:52 AM   Available via Epic secure chat 7am-7pm After these hours, please refer to coverage provider listed on [email or web address]

## 2023-09-13 NOTE — Progress Notes (Signed)
Physical Therapy Treatment Patient Details Name: Sarah Harper MRN: [id number] DOB: [birth year] Today's Date: 09/13/2023   History of Present Illness Pt is an 83 y.o. female who presents to [facility] 09/05/23 after tripping and falling on R side with no LOC. Pt admitted w/ R intertrochanteric fx of R femoral neck, s/p IMN 09/06/23.  Incidentally + COVID 1/18. PMH significant for HTN, osteopenia, CVA, syncopal episodes, TIA, DDD.    PT Comments  Treatment session unfortunately limited again due to diarrhea. Discussed with RN regarding bowel regimen. Pt requiring min assist for bed mobility and transfers. Dependent for peri care in standing position. Pt reports lightheadedness and cannot tolerate static standing for extended periods. Patient will benefit from continued inpatient follow up therapy, <3 hours/day in order to address strengthening, functional mobility, balance, endurance.     If plan is discharge home, recommend the following: A lot of help with bathing/dressing/bathroom;Assistance with cooking/housework;Assist for transportation;Help with stairs or ramp for entrance;A little help with walking and/or transfers   Can travel by private vehicle     No  Equipment Recommendations  Rolling walker (2 wheels);BSC/3in1    Recommendations for Other Services       Precautions / Restrictions Precautions Precautions: Fall Restrictions Weight Bearing Restrictions Per Provider Order: No RLE Weight Bearing Per Provider Order: Weight bearing as tolerated     Mobility  Bed Mobility Overal bed mobility: Needs Assistance Bed Mobility: Supine to Sit     Supine to sit: Min assist     General bed mobility comments: Pt progressing to edge of bed slowlly, use of bed pad to pivot hips to edge of bed    Transfers Overall transfer level: Needs assistance Equipment used: Rolling walker (2 wheels), 2 person hand held assist Transfers: Sit to/from Stand, Bed to chair/wheelchair/BSC Sit to Stand:  Min assist Stand pivot transfers: Min assist         General transfer comment: MinA to power up to standing position; static standing edge of bed for peri care. Pt required one seated rest break due to lightheadedness. Pivoted to chair with assist for balance, decreased R foot clearance    Ambulation/Gait                   Stairs             Wheelchair Mobility     Tilt Bed    Modified Rankin (Stroke Patients Only)       Balance Overall balance assessment: Needs assistance Sitting-balance support: No upper extremity supported, Feet supported Sitting balance-Leahy Scale: Good     Standing balance support: Bilateral upper extremity supported, Reliant on assistive device for balance Standing balance-Leahy Scale: Poor                              Cognition Arousal: Alert Behavior During Therapy: WFL for tasks assessed/performed Overall Cognitive Status: Within Functional Limits for tasks assessed                                          Exercises      General Comments        Pertinent Vitals/Pain Pain Assessment Pain Assessment: Faces Faces Pain Scale: Hurts little more Pain Location: back/hip Pain Descriptors / Indicators: Aching, Discomfort, Grimacing, Guarding, Operative site guarding, Burning Pain Intervention(s): Limited activity within patient's  tolerance, Monitored during session    Home Living                          Prior Function            PT Goals (current goals can now be found in the care plan section) Acute Rehab PT Goals Patient Stated Goal: Home at d/c PT Goal Formulation: With patient/family Time For Goal Achievement: 09/21/23 Potential to Achieve Goals: Good    Frequency    Min 1X/week      PT Plan      Co-evaluation              AM-PAC PT "6 Clicks" Mobility   Outcome Measure  Help needed turning from your back to your side while in a flat bed without using  bedrails?: A Little Help needed moving from lying on your back to sitting on the side of a flat bed without using bedrails?: A Little Help needed moving to and from a bed to a chair (including a wheelchair)?: A Little Help needed standing up from a chair using your arms (e.g., wheelchair or bedside chair)?: A Little Help needed to walk in hospital room?: Total Help needed climbing 3-5 steps with a railing? : Total 6 Click Score: 14    End of Session Equipment Utilized During Treatment: Gait belt Activity Tolerance: Patient tolerated treatment well Patient left: with call bell/phone within reach;in chair;with chair alarm set Nurse Communication: Mobility status PT Visit Diagnosis: Unsteadiness on feet (R26.81);Pain Pain - Right/Left: Right Pain - part of body: Hip (hip and back)     Time: 2536-6440 PT Time Calculation (min) (ACUTE ONLY): 35 min  Charges:    $Therapeutic Activity: 23-37 mins PT General Charges $$ ACUTE PT VISIT: 1 Visit                     Lillia Pauls, PT, DPT Acute Rehabilitation Services Office [phone]    Sarah Harper 09/13/2023, 12:29 PM

## 2023-09-14 DIAGNOSIS — S72001A Fracture of unspecified part of neck of right femur, initial encounter for closed fracture: Secondary | ICD-10-CM | POA: Diagnosis not present

## 2023-09-14 LAB — BASIC METABOLIC PANEL
Anion gap: 9 (ref 5–15)
BUN: 8 mg/dL (ref 8–23)
CO2: 26 mmol/L (ref 22–32)
Calcium: 8.7 mg/dL — ABNORMAL LOW (ref 8.9–10.3)
Chloride: 98 mmol/L (ref 98–111)
Creatinine, Ser: 0.78 mg/dL (ref 0.44–1.00)
GFR, Estimated: >60 mL/min (ref >60)
Glucose, Bld: 98 mg/dL (ref 70–99)
Potassium: 3.8 mmol/L (ref 3.5–5.1)
Sodium: 133 mmol/L — ABNORMAL LOW (ref 135–145)

## 2023-09-14 MED ORDER — ENSURE ENLIVE PO LIQD
237.0000 mL | Freq: Three times a day (TID) | ORAL | Status: DC
  Administered 2023-09-14 – 2023-09-17 (×5): 237 mL via ORAL

## 2023-09-14 NOTE — Progress Notes (Signed)
PROGRESS NOTE    KATASHA RIGA  WUJ:[id number] DOB: 30-Nov-1940 DOA: 09/05/2023 PCP: Judy Pimple, MD     Brief Narrative:  Sarah Harper is a 83 y.o. female with past medical history significant for HTN, hypothyroidism, dyslipidemia, GERD, osteopenia, OSA on CPAP who presented to [facility] ED on 1/15 with complaint of right hip pain.  Patient reports she tripped and fell onto her right side without loss of consciousness.  She was unable to ambulate or get up after event.  EMS was called to her home where there was noticeable shortening and rotation of the right leg.  Patient denied chest pain, no dizziness, no shortness of breath, no palpitations.  Patient was transported to the ED for further evaluation and management.   In the ED, temperature 99.3 F, HR 84, RR 18, BP 166/105, SpO2 95% on room air.  WBC of 7.2, hemoglobin 12.9, platelet count 185.  Sodium 126, potassium 3.2, chloride 93, CO2 21, glucose 106, BUN 9, creatinine 0.74.  TSH 1.538.  Chest x-ray with no active cardiopulmonary disease process.  X-ray right pelvis with acute impacted, angulated, displaced intertrochanteric fracture proximal right femur.  Right femur x-ray with intertrochanteric fracture right femoral neck.  Orthopedics was consulted.  TRH consulted for admission for further evaluation management of right femur fracture.  Patient underwent intramedullary nailing of the right hip 09/06/2023 by Dr. Carola Frost.  She tested positive for COVID on 09/08/2023 and is currently under isolation until she can discharge to skilled nursing facility.  New events last 24 hours / Subjective: Feeling better, sitting in bed eating breakfast.  On room air  Assessment & Plan:   Principal Problem:   Closed right hip fracture (HCC) Active Problems:   Hypothyroidism   GERD   Hyperlipidemia   OSA (obstructive sleep apnea)   GAD (generalized anxiety disorder)   Fall at home   Hypertension, essential   COVID-19 virus infection   ABLA (acute  blood loss anemia)  Right displaced, angulated, impacted intertrochanteric fracture proximal right femur -Following mechanical fall at home -Status post intramedullary nailing of the right hip on 09/06/2023 by Dr. Carola Frost -WBAT RLE -Pain control, Lovenox for DVT prophylaxis -SNF placement pending, can discharge 1/27 -Follow-up orthopedic surgery outpatient in 2 weeks  COVID -Completed 5 days of Paxlovid -Airborne isolation -Can discharge to [facility] 1/27  Acute postop blood loss anemia -Stable  Hypoosmolar hyponatremia -Likely secondary to SIADH versus recent use of Bactrim -Sodium improving   Hypertension -Amlodipine, metoprolol  Hypothyroidism -Synthroid  GERD -Protonix, Pepcid, Carafate  Anxiety -Ativan as needed  DVT prophylaxis:  SCDs Start: 09/06/23 1107 enoxaparin (LOVENOX) injection 40 mg Start: 09/05/23 1200  Code Status: Full code Family Communication: None at bedside Disposition Plan: SNF Status is: Inpatient Remains inpatient appropriate because: Currently under isolation due to COVID.  Can discharge to SNF once isolation completed on 1/27    Antimicrobials:  Anti-infectives (From admission, onward)    Start     Dose/Rate Route Frequency Ordered Stop   09/08/23 2200  nirmatrelvir/ritonavir (PAXLOVID) 3 tablet        3 tablet Oral 2 times daily 09/08/23 1627 09/13/23 1028   09/06/23 1200  ceFAZolin (ANCEF) IVPB 2g/100 mL premix        2 g 200 mL/hr over 30 Minutes Intravenous Every 6 hours 09/06/23 1106 09/06/23 1915   09/06/23 0730  ceFAZolin (ANCEF) IVPB 2g/100 mL premix        2 g 200 mL/hr over 30 Minutes Intravenous  On call to O.R. 09/06/23 0728 09/06/23 0839   09/05/23 1215  sulfamethoxazole-trimethoprim (BACTRIM DS) 800-160 MG per tablet 1 tablet        1 tablet Oral 2 times daily 09/05/23 1202 09/05/23 2208        Objective: Vitals:   09/13/23 0816 09/13/23 1739 09/13/23 2015 09/14/23 0850  BP: (!) 156/80 138/75 (!) 134/58 133/63  Pulse:  85 89 84 86  Resp: 18 17 18 20   Temp: 98.2 F (36.8 C) 98.6 F (37 C) 98.6 F (37 C) 98.6 F (37 C)  TempSrc: Oral Oral Oral Oral  SpO2: 98% 99% 97% 97%  Weight:      Height:        Intake/Output Summary (Last 24 hours) at 09/14/2023 1329 Last data filed at 09/14/2023 0923 Gross per 24 hour  Intake 240 ml  Output 850 ml  Net -610 ml   Filed Weights   09/06/23 0717 09/08/23 0502  Weight: 59 kg 62.6 kg    Examination:  General exam: Appears calm and comfortable  Respiratory system: Respiratory effort normal. No respiratory distress. No conversational dyspnea.  Central nervous system: Alert and oriented Extremities: Symmetric in appearance  Psychiatry: Judgement and insight appear normal. Mood & affect appropriate.   Data Reviewed: I have personally reviewed following labs and imaging studies  CBC: Recent Labs  Lab 09/08/23 0450 09/09/23 0450 09/10/23 0452 09/13/23 0604  WBC 5.4 5.9 5.9 7.5  HGB 9.7* 9.7* 9.7* 10.0*  HCT 28.0* 28.3* 29.3* 29.5*  MCV 84.6 85.2 87.5 85.0  PLT 133* 145* 192 340   Basic Metabolic Panel: Recent Labs  Lab 09/08/23 0450 09/09/23 0450 09/10/23 0452 09/11/23 0520 09/12/23 0537 09/13/23 0604 09/14/23 0626  NA 123*   < > 129* 127* 124* 129* 133*  K 3.9   < > 4.1 3.6 3.2* 3.4* 3.8  CL 92*   < > 98 94* 90* 95* 98  CO2 24   < > 22 25 24 25 26   GLUCOSE 106*   < > 99 99 93 96 98  BUN 15   < > 13 <5* 8 9 8   CREATININE 0.64   < > 0.54 0.57 0.54 0.53 0.78  CALCIUM 8.1*   < > 8.3* 7.9* 8.3* 8.4* 8.7*  MG 2.0  --   --   --   --  1.7  --    < > = values in this interval not displayed.   GFR: Estimated Creatinine Clearance: 46.8 mL/min (by C-G formula based on SCr of 0.78 mg/dL). Liver Function Tests: No results for input(s): "AST", "ALT", "ALKPHOS", "BILITOT", "PROT", "ALBUMIN" in the last 168 hours. No results for input(s): "LIPASE", "AMYLASE" in the last 168 hours. No results for input(s): "AMMONIA" in the last 168 hours. Coagulation  Profile: No results for input(s): "INR", "PROTIME" in the last 168 hours. Cardiac Enzymes: No results for input(s): "CKTOTAL", "CKMB", "CKMBINDEX", "TROPONINI" in the last 168 hours. BNP (last 3 results) No results for input(s): "PROBNP" in the last 8760 hours. HbA1C: No results for input(s): "HGBA1C" in the last 72 hours. CBG: No results for input(s): "GLUCAP" in the last 168 hours. Lipid Profile: No results for input(s): "CHOL", "HDL", "LDLCALC", "TRIG", "CHOLHDL", "LDLDIRECT" in the last 72 hours. Thyroid Function Tests: No results for input(s): "TSH", "T4TOTAL", "FREET4", "T3FREE", "THYROIDAB" in the last 72 hours. Anemia Panel: No results for input(s): "VITAMINB12", "FOLATE", "FERRITIN", "TIBC", "IRON", "RETICCTPCT" in the last 72 hours. Sepsis Labs: No results for input(s): "PROCALCITON", "  LATICACIDVEN" in the last 168 hours.  Recent Results (from the past 240 hours)  Surgical pcr screen     Status: None   Collection Time: 09/05/23  3:41 PM   Specimen: Nasal Mucosa; Nasal Swab  Result Value Ref Range Status   MRSA, PCR NEGATIVE NEGATIVE Final   Staphylococcus aureus NEGATIVE NEGATIVE Final    Comment: (NOTE) The Xpert SA Assay (FDA approved for NASAL specimens in patients 1 years of age and older), is one component of a comprehensive surveillance program. It is not intended to diagnose infection nor to guide or monitor treatment. Performed at [facility], [address], [place], [place] [address]   SARS Coronavirus 2 by RT PCR (hospital order, performed in [facility] hospital lab) *cepheid single result test* Anterior Nasal Swab     Status: Abnormal   Collection Time: 09/08/23 11:15 AM   Specimen: Anterior Nasal Swab  Result Value Ref Range Status   SARS Coronavirus 2 by RT PCR POSITIVE (A) NEGATIVE Final    Comment: Performed at [facility], [address] [place], [place] [address]      Radiology Studies: No results found.    Scheduled Meds:   amLODipine  2.5 mg Oral Daily   enoxaparin (LOVENOX) injection  40 mg Subcutaneous Q24H   levothyroxine  75 mcg Oral Daily   melatonin  3 mg Oral QHS   metoprolol tartrate  12.5 mg Oral BID   pantoprazole  40 mg Oral Daily   Ensure Max Protein  11 oz Oral BID   Continuous Infusions:   LOS: 9 days   Time spent: 25 minutes   Noralee Stain, DO [facility] 09/14/2023, 1:29 PM   Available via Epic secure chat 7am-7pm After these hours, please refer to coverage provider listed on [email or web address]

## 2023-09-14 NOTE — Evaluation (Signed)
Occupational Therapy Evaluation Patient Details Name: Sarah Harper MRN: [id number] DOB: 11/27/1940 Today's Date: 09/14/2023   History of Present Illness Pt is an 83 y.o. female who presents to [facility] 09/05/23 after tripping and falling on R side with no LOC. Pt admitted w/ R intertrochanteric fx of R femoral neck, s/p IMN 09/06/23.  Incidentally + COVID 1/18. PMH significant for HTN, osteopenia, CVA, syncopal episodes, TIA, DDD.   Clinical Impression   Pt supine in bed and agreeable to OT.  Min guard for bed mobility, min assist for stand pivot transfer using RW to recliner.  Min guard to setup for UB ADLs, max assist for LB Adls.  She continues to become lightheaded when standing initially, but fades with sustain standing (although limited by weakness). Pt re-oriented to month today, tends to repeat herself during session.  Formal cognition assessment recommended.  BP stable today, see below.  Will follow acutely, continue to recommend <3 hrs/day inpatient setting.   BP supine: 140/68 HR 87 BP EOB: 139/87 HR 92  BP standing: 126/71 HR 102        If plan is discharge home, recommend the following: Two people to help with walking and/or transfers;Two people to help with bathing/dressing/bathroom;Assistance with cooking/housework;Assist for transportation;Help with stairs or ramp for entrance    Functional Status Assessment     Equipment Recommendations  Other (comment) (defer)    Recommendations for Other Services       Precautions / Restrictions Precautions Precautions: Fall Restrictions Weight Bearing Restrictions Per Provider Order: Yes RLE Weight Bearing Per Provider Order: Weight bearing as tolerated      Mobility Bed Mobility Overal bed mobility: Needs Assistance Bed Mobility: Supine to Sit     Supine to sit: Contact guard     General bed mobility comments: HOB elevated but no physical assistance required to transition to EOB    Transfers Overall transfer level:  Needs assistance Equipment used: Rolling walker (2 wheels), 2 person hand held assist Transfers: Sit to/from Stand, Bed to chair/wheelchair/BSC Sit to Stand: Min assist Stand pivot transfers: Min assist         General transfer comment: min assist to power up and steady, stepping towards L side to recliner. cueing for hand placement and safety      Balance Overall balance assessment: Needs assistance Sitting-balance support: No upper extremity supported, Feet supported Sitting balance-Leahy Scale: Good Sitting balance - Comments: min gaurd statically, tends to lean posteriorly Postural control: Posterior lean Standing balance support: Bilateral upper extremity supported, Reliant on assistive device for balance Standing balance-Leahy Scale: Poor Standing balance comment: relies on BUE and external support                           ADL either performed or assessed with clinical judgement   ADL Overall ADL's : Needs assistance/impaired     Grooming: Oral care;Sitting;Set up           Upper Body Dressing : Set up;Sitting   Lower Body Dressing: Maximal assistance;Sit to/from stand   Toilet Transfer: Minimal assistance;Stand-pivot;Rolling walker (2 wheels) Toilet Transfer Details (indicate cue type and reason): to recliner         Functional mobility during ADLs: Minimal assistance;Rolling walker (2 wheels)       Vision         Perception         Praxis         Pertinent Vitals/Pain Pain Assessment  Pain Assessment: Faces Faces Pain Scale: Hurts a little bit Pain Location: back/hip Pain Descriptors / Indicators: Aching, Discomfort, Grimacing, Guarding, Operative site guarding, Burning Pain Intervention(s): Limited activity within patient's tolerance, Monitored during session, Repositioned     Extremity/Trunk Assessment Upper Extremity Assessment Upper Extremity Assessment: Generalized weakness           Communication     Cognition  Arousal: Alert Behavior During Therapy: WFL for tasks assessed/performed Overall Cognitive Status: Impaired/Different from baseline Area of Impairment: Memory, Awareness, Problem solving, Orientation                 Orientation Level: Disoriented to, Time   Memory: Decreased short-term memory     Awareness: Emergent Problem Solving: Slow processing, Difficulty sequencing, Requires verbal cues General Comments: pt disoriented to time today, reports december and she repeats the same conversation mulitple times during session.  She requires cueing for safety and problem solving.     General Comments  BP stable today    Exercises     Shoulder Instructions      Home Living                                          Prior Functioning/Environment                          OT Problem List:        OT Treatment/Interventions:      OT Goals(Current goals can be found in the care plan section) Acute Rehab OT Goals Patient Stated Goal: get better OT Goal Formulation: With patient Time For Goal Achievement: 09/21/23 Potential to Achieve Goals: Good  OT Frequency: Min 1X/week    Co-evaluation              AM-PAC OT "6 Clicks" Daily Activity     Outcome Measure Help from another person eating meals?: A Little Help from another person taking care of personal grooming?: A Little Help from another person toileting, which includes using toliet, bedpan, or urinal?: Total Help from another person bathing (including washing, rinsing, drying)?: A Lot Help from another person to put on and taking off regular upper body clothing?: A Little Help from another person to put on and taking off regular lower body clothing?: A Lot 6 Click Score: 14   End of Session Equipment Utilized During Treatment: Rolling walker (2 wheels) Nurse Communication: Mobility status  Activity Tolerance: Patient tolerated treatment well Patient left: in chair;with call  bell/phone within reach;with chair alarm set  OT Visit Diagnosis: Other abnormalities of gait and mobility (R26.89);Pain Pain - Right/Left: Right Pain - part of body: Hip                Time: 1209-1240 OT Time Calculation (min): 31 min Charges:  OT General Charges $OT Visit: 1 Visit OT Treatments $Self Care/Home Management : 23-37 mins  Barry Brunner, OT Acute Rehabilitation Services Office [phone]   Chancy Milroy 09/14/2023, 1:36 PM

## 2023-09-14 NOTE — Progress Notes (Signed)
   09/13/23 2228  BiPAP/CPAP/SIPAP  BiPAP/CPAP/SIPAP Pt Type Adult  BiPAP/CPAP/SIPAP Resmed  Reason BIPAP/CPAP not in use Non-compliant

## 2023-09-14 NOTE — Progress Notes (Signed)
Nutrition Follow-up  DOCUMENTATION CODES:   Not applicable  INTERVENTION:  Continue regular diet as ordered Change Ensure Max to Ensure Enlive po BID, each supplement provides 350 kcal and 20 grams of protein. Magic cup TID with meals, each supplement provides 290 kcal and 9 grams of protein  NUTRITION DIAGNOSIS:   Increased nutrient needs related to hip fracture, post-op healing as evidenced by estimated needs. - ongoing  GOAL:   Patient will meet greater than or equal to 90% of their needs - progressing, addressing via meals and nutrition supplements  MONITOR:   PO intake, Labs, Supplement acceptance, Weight trends, Skin  REASON FOR ASSESSMENT:   Consult Assessment of nutrition requirement/status  ASSESSMENT:   83 y/o female presented to the ED after a mechanical fall at home, sustaining injury to her right hip with acute pain. Admitted with closed right hip fracture.  PMH: HTN, hypothyroidism, GERD, osteopenia, dyslipidemia, OSA, recent UTI, IBS, Meniere's disease, mild cognitive impairement, osteoporosis, stroke, syncopal events, TIA, current marijuana use.  1/16-IM of right hip fracture 1/16-SLP swallow evaluation recommended regular diet with thin liquids  1/18 - found to be COVID +  Noted plans to d/c to SNF on 1/27  Pt eating about fairly well though meal documentation's are limited.  1/18: 50% breakfast 1/21: 50% breakfast, 75% lunch, 75% dinner  Pt receiving Ensure Max though per review of MAR has been intermittently refusing. Given higher volume of this supplement (11 fl oz), pt would likely better tolerate less volume with increased calorie and protein needs to support increased nutrition needs for post op healing.   Last measured weight 1/18 documented to be 62.6 kg. No significant weight fluctuations noted prior to admission.   Meal completions: 1/17: 60% lunch 1/18: 50% breakfast 1/21: 50% breakfast, 75% lunch, 75% dinner  Medications: melatonin,  protonix  Labs:  Sodium 133  Diet Order:   Diet Order             Diet regular Room service appropriate? Yes; Fluid consistency: Thin  Diet effective now                   EDUCATION NEEDS:   Education needs have been addressed  Skin:  Skin Assessment: Skin Integrity Issues: Skin Integrity Issues:: Incisions Incisions: R hip  Last BM:  1/23 type 2  Height:  Ht Readings from Last 1 Encounters:  09/06/23 5\' 4"  (1.626 m)    Weight:  Wt Readings from Last 1 Encounters:  09/08/23 62.6 kg   BMI:  Body mass index is 23.69 kg/m.  Estimated Nutritional Needs:   Kcal:  1500-1700  Protein:  75-85g  Fluid:  >/=1.5L  Drusilla Kanner, RDN, LDN Clinical Nutrition

## 2023-09-14 NOTE — Plan of Care (Signed)
Problem: Education: Goal: Knowledge of General Education information will improve Description: Including pain rating scale, medication(s)/side effects and non-pharmacologic comfort measures Outcome: Progressing   Problem: Health Behavior/Discharge Planning: Goal: Ability to manage health-related needs will improve Outcome: Progressing   Problem: Clinical Measurements: Goal: Ability to maintain clinical measurements within normal limits will improve Outcome: Progressing Goal: Will remain free from infection Outcome: Progressing Goal: Diagnostic test results will improve Outcome: Progressing Goal: Respiratory complications will improve Outcome: Progressing Goal: Cardiovascular complication will be avoided Outcome: Progressing   Problem: Activity: Goal: Risk for activity intolerance will decrease Outcome: Progressing   Problem: Nutrition: Goal: Adequate nutrition will be maintained Outcome: Progressing   Problem: Coping: Goal: Level of anxiety will decrease Outcome: Progressing   Problem: Elimination: Goal: Will not experience complications related to bowel motility Outcome: Progressing Goal: Will not experience complications related to urinary retention Outcome: Progressing   Problem: Pain Managment: Goal: General experience of comfort will improve and/or be controlled Outcome: Progressing   Problem: Safety: Goal: Ability to remain free from injury will improve Outcome: Progressing   Problem: Skin Integrity: Goal: Risk for impaired skin integrity will decrease Outcome: Progressing   Problem: Education: Goal: Verbalization of understanding the information provided (i.e., activity precautions, restrictions, etc) will improve Outcome: Progressing Goal: Individualized Educational Video(s) Outcome: Progressing   Problem: Activity: Goal: Ability to ambulate and perform ADLs will improve Outcome: Progressing   Problem: Clinical Measurements: Goal: Postoperative  complications will be avoided or minimized Outcome: Progressing   Problem: Self-Concept: Goal: Ability to maintain and perform role responsibilities to the fullest extent possible will improve Outcome: Progressing   Problem: Pain Management: Goal: Pain level will decrease Outcome: Progressing   Problem: Education: Goal: Knowledge of risk factors and measures for prevention of condition will improve Outcome: Progressing   Problem: Coping: Goal: Psychosocial and spiritual needs will be supported Outcome: Progressing   Problem: Respiratory: Goal: Will maintain a patent airway Outcome: Progressing Goal: Complications related to the disease process, condition or treatment will be avoided or minimized Outcome: Progressing

## 2023-09-15 DIAGNOSIS — S72001A Fracture of unspecified part of neck of right femur, initial encounter for closed fracture: Secondary | ICD-10-CM | POA: Diagnosis not present

## 2023-09-15 LAB — BASIC METABOLIC PANEL
Anion gap: 6 (ref 5–15)
BUN: 10 mg/dL (ref 8–23)
CO2: 29 mmol/L (ref 22–32)
Calcium: 8.6 mg/dL — ABNORMAL LOW (ref 8.9–10.3)
Chloride: 96 mmol/L — ABNORMAL LOW (ref 98–111)
Creatinine, Ser: 0.52 mg/dL (ref 0.44–1.00)
GFR, Estimated: >60 mL/min (ref >60)
Glucose, Bld: 97 mg/dL (ref 70–99)
Potassium: 3.5 mmol/L (ref 3.5–5.1)
Sodium: 131 mmol/L — ABNORMAL LOW (ref 135–145)

## 2023-09-15 MED ORDER — TRAMADOL HCL 50 MG PO TABS
50.0000 mg | ORAL_TABLET | Freq: Once | ORAL | Status: AC
  Administered 2023-09-15: 50 mg via ORAL
  Filled 2023-09-15: qty 1

## 2023-09-15 NOTE — Progress Notes (Signed)
PROGRESS NOTE    Sarah Harper  XBJ:[id number] DOB: 1940/11/29 DOA: 09/05/2023 PCP: Judy Pimple, MD     Brief Narrative:  Sarah Harper is a 83 y.o. female with past medical history significant for HTN, hypothyroidism, dyslipidemia, GERD, osteopenia, OSA on CPAP who presented to [facility] ED on 1/15 with complaint of right hip pain.  Patient reports she tripped and fell onto her right side without loss of consciousness.  She was unable to ambulate or get up after event.  EMS was called to her home where there was noticeable shortening and rotation of the right leg.  Patient denied chest pain, no dizziness, no shortness of breath, no palpitations.  Patient was transported to the ED for further evaluation and management.   In the ED, temperature 99.3 F, HR 84, RR 18, BP 166/105, SpO2 95% on room air.  WBC of 7.2, hemoglobin 12.9, platelet count 185.  Sodium 126, potassium 3.2, chloride 93, CO2 21, glucose 106, BUN 9, creatinine 0.74.  TSH 1.538.  Chest x-ray with no active cardiopulmonary disease process.  X-ray right pelvis with acute impacted, angulated, displaced intertrochanteric fracture proximal right femur.  Right femur x-ray with intertrochanteric fracture right femoral neck.  Orthopedics was consulted.  TRH consulted for admission for further evaluation management of right femur fracture.  Patient underwent intramedullary nailing of the right hip 09/06/2023 by Dr. Carola Frost.  She tested positive for COVID on 09/08/2023 and is currently under isolation until she can discharge to skilled nursing facility.  New events last 24 hours / Subjective: Sitting at edge of bed, attempting to transfer to chair.  Remains very weak  Assessment & Plan:   Principal Problem:   Closed right hip fracture (HCC) Active Problems:   Hypothyroidism   GERD   Hyperlipidemia   OSA (obstructive sleep apnea)   GAD (generalized anxiety disorder)   Fall at home   Hypertension, essential   COVID-19 virus infection    ABLA (acute blood loss anemia)  Right displaced, angulated, impacted intertrochanteric fracture proximal right femur -Following mechanical fall at home -Status post intramedullary nailing of the right hip on 09/06/2023 by Dr. Carola Frost -WBAT RLE -Pain control, Lovenox for DVT prophylaxis -SNF placement pending, can discharge 1/27 -Follow-up orthopedic surgery outpatient in 2 weeks  COVID -Completed 5 days of Paxlovid -Airborne isolation -Can discharge to [facility] 1/27  Acute postop blood loss anemia -Stable  Hypoosmolar hyponatremia -Likely secondary to SIADH versus recent use of Bactrim -Sodium improving   Hypertension -Amlodipine, metoprolol  Hypothyroidism -Synthroid  GERD -Protonix, Pepcid, Carafate  Anxiety -Ativan as needed  DVT prophylaxis:  SCDs Start: 09/06/23 1107 enoxaparin (LOVENOX) injection 40 mg Start: 09/05/23 1200  Code Status: Full code Family Communication: at bedside Disposition Plan: SNF Status is: Inpatient Remains inpatient appropriate because: Currently under isolation due to COVID.  Can discharge to SNF once isolation completed on 1/27    Antimicrobials:  Anti-infectives (From admission, onward)    Start     Dose/Rate Route Frequency Ordered Stop   09/08/23 2200  nirmatrelvir/ritonavir (PAXLOVID) 3 tablet        3 tablet Oral 2 times daily 09/08/23 1627 09/13/23 1028   09/06/23 1200  ceFAZolin (ANCEF) IVPB 2g/100 mL premix        2 g 200 mL/hr over 30 Minutes Intravenous Every 6 hours 09/06/23 1106 09/06/23 1915   09/06/23 0730  ceFAZolin (ANCEF) IVPB 2g/100 mL premix        2 g 200 mL/hr over 30  Minutes Intravenous On call to O.R. 09/06/23 0728 09/06/23 0839   09/05/23 1215  sulfamethoxazole-trimethoprim (BACTRIM DS) 800-160 MG per tablet 1 tablet        1 tablet Oral 2 times daily 09/05/23 1202 09/05/23 2208        Objective: Vitals:   09/14/23 2049 09/15/23 0434 09/15/23 0900 09/15/23 0953  BP: 132/71 (!) 157/72 (!) 153/73 (!)  153/73  Pulse: 87 92  92  Resp: 18 18 20    Temp: 98.8 F (37.1 C) 98.3 F (36.8 C)    TempSrc: Oral Oral    SpO2: 98% 96% 96%   Weight:      Height:        Intake/Output Summary (Last 24 hours) at 09/15/2023 1328 Last data filed at 09/15/2023 0800 Gross per 24 hour  Intake --  Output 650 ml  Net -650 ml   Filed Weights   09/06/23 0717 09/08/23 0502  Weight: 59 kg 62.6 kg    Examination:  General exam: Appears calm and comfortable  Respiratory system: Respiratory effort normal. No respiratory distress. No conversational dyspnea.  Central nervous system: Alert and oriented Extremities: Symmetric in appearance  Psychiatry: Judgement and insight appear normal. Mood & affect appropriate.   Data Reviewed: I have personally reviewed following labs and imaging studies  CBC: Recent Labs  Lab 09/09/23 0450 09/10/23 0452 09/13/23 0604  WBC 5.9 5.9 7.5  HGB 9.7* 9.7* 10.0*  HCT 28.3* 29.3* 29.5*  MCV 85.2 87.5 85.0  PLT 145* 192 340   Basic Metabolic Panel: Recent Labs  Lab 09/11/23 0520 09/12/23 0537 09/13/23 0604 09/14/23 0626 09/15/23 0553  NA 127* 124* 129* 133* 131*  K 3.6 3.2* 3.4* 3.8 3.5  CL 94* 90* 95* 98 96*  CO2 25 24 25 26 29   GLUCOSE 99 93 96 98 97  BUN <5* 8 9 8 10   CREATININE 0.57 0.54 0.53 0.78 0.52  CALCIUM 7.9* 8.3* 8.4* 8.7* 8.6*  MG  --   --  1.7  --   --    GFR: Estimated Creatinine Clearance: 46.8 mL/min (by C-G formula based on SCr of 0.52 mg/dL). Liver Function Tests: No results for input(s): "AST", "ALT", "ALKPHOS", "BILITOT", "PROT", "ALBUMIN" in the last 168 hours. No results for input(s): "LIPASE", "AMYLASE" in the last 168 hours. No results for input(s): "AMMONIA" in the last 168 hours. Coagulation Profile: No results for input(s): "INR", "PROTIME" in the last 168 hours. Cardiac Enzymes: No results for input(s): "CKTOTAL", "CKMB", "CKMBINDEX", "TROPONINI" in the last 168 hours. BNP (last 3 results) No results for input(s):  "PROBNP" in the last 8760 hours. HbA1C: No results for input(s): "HGBA1C" in the last 72 hours. CBG: No results for input(s): "GLUCAP" in the last 168 hours. Lipid Profile: No results for input(s): "CHOL", "HDL", "LDLCALC", "TRIG", "CHOLHDL", "LDLDIRECT" in the last 72 hours. Thyroid Function Tests: No results for input(s): "TSH", "T4TOTAL", "FREET4", "T3FREE", "THYROIDAB" in the last 72 hours. Anemia Panel: No results for input(s): "VITAMINB12", "FOLATE", "FERRITIN", "TIBC", "IRON", "RETICCTPCT" in the last 72 hours. Sepsis Labs: No results for input(s): "PROCALCITON", "LATICACIDVEN" in the last 168 hours.  Recent Results (from the past 240 hours)  Surgical pcr screen     Status: None   Collection Time: 09/05/23  3:41 PM   Specimen: Nasal Mucosa; Nasal Swab  Result Value Ref Range Status   MRSA, PCR NEGATIVE NEGATIVE Final   Staphylococcus aureus NEGATIVE NEGATIVE Final    Comment: (NOTE) The Xpert SA Assay (FDA approved  for NASAL specimens in patients 45 years of age and older), is one component of a comprehensive surveillance program. It is not intended to diagnose infection nor to guide or monitor treatment. Performed at [facility], [address], [place], [place] [address]   SARS Coronavirus 2 by RT PCR (hospital order, performed in [facility] hospital lab) *cepheid single result test* Anterior Nasal Swab     Status: Abnormal   Collection Time: 09/08/23 11:15 AM   Specimen: Anterior Nasal Swab  Result Value Ref Range Status   SARS Coronavirus 2 by RT PCR POSITIVE (A) NEGATIVE Final    Comment: Performed at [facility], [address], [place], [place] [address]      Radiology Studies: No results found.    Scheduled Meds:  amLODipine  2.5 mg Oral Daily   enoxaparin (LOVENOX) injection  40 mg Subcutaneous Q24H   feeding supplement  237 mL Oral TID BM   levothyroxine  75 mcg Oral Daily   melatonin  3 mg Oral QHS   metoprolol tartrate  12.5 mg Oral  BID   pantoprazole  40 mg Oral Daily   Continuous Infusions:   LOS: 10 days   Time spent: 20 minutes   Noralee Stain, DO [facility] 09/15/2023, 1:28 PM   Available via Epic secure chat 7am-7pm After these hours, please refer to coverage provider listed on [email or web address]

## 2023-09-15 NOTE — Progress Notes (Signed)
   09/15/23 2024  BiPAP/CPAP/SIPAP  $ Non-Invasive Ventilator  Non-Invasive Vent Subsequent  BiPAP/CPAP/SIPAP Pt Type Adult  BiPAP/CPAP/SIPAP Resmed  Mask Type Full face mask  Mask Size Small  PEEP 5 cmH20  FiO2 (%) 28 %  Flow Rate 2 lpm  Patient Home Equipment No  Auto Titrate No  CPAP/SIPAP surface wiped down Yes  BiPAP/CPAP /SiPAP Vitals  Pulse Rate 70  Resp 17  SpO2 96 %  Bilateral Breath Sounds Clear;Diminished  MEWS Score/Color  MEWS Score 0  MEWS Score Color Green

## 2023-09-16 DIAGNOSIS — S72001A Fracture of unspecified part of neck of right femur, initial encounter for closed fracture: Secondary | ICD-10-CM | POA: Diagnosis not present

## 2023-09-16 LAB — BASIC METABOLIC PANEL
Anion gap: 10 (ref 5–15)
BUN: 9 mg/dL (ref 8–23)
CO2: 27 mmol/L (ref 22–32)
Calcium: 8.7 mg/dL — ABNORMAL LOW (ref 8.9–10.3)
Chloride: 90 mmol/L — ABNORMAL LOW (ref 98–111)
Creatinine, Ser: 0.6 mg/dL (ref 0.44–1.00)
GFR, Estimated: >60 mL/min (ref >60)
Glucose, Bld: 84 mg/dL (ref 70–99)
Potassium: 3.3 mmol/L — ABNORMAL LOW (ref 3.5–5.1)
Sodium: 127 mmol/L — ABNORMAL LOW (ref 135–145)

## 2023-09-16 MED ORDER — FLUTICASONE PROPIONATE 50 MCG/ACT NA SUSP
2.0000 | Freq: Every day | NASAL | Status: DC
  Administered 2023-09-16: 2 via NASAL
  Filled 2023-09-16: qty 16

## 2023-09-16 MED ORDER — POTASSIUM CHLORIDE CRYS ER 20 MEQ PO TBCR
40.0000 meq | EXTENDED_RELEASE_TABLET | Freq: Once | ORAL | Status: AC
  Administered 2023-09-16: 40 meq via ORAL
  Filled 2023-09-16: qty 2

## 2023-09-16 MED ORDER — SODIUM CHLORIDE 1 G PO TABS
1.0000 g | ORAL_TABLET | Freq: Two times a day (BID) | ORAL | Status: DC
  Administered 2023-09-16 – 2023-09-17 (×3): 1 g via ORAL
  Filled 2023-09-16 (×3): qty 1

## 2023-09-16 NOTE — Plan of Care (Signed)
Problem: Education: Goal: Knowledge of General Education information will improve Description: Including pain rating scale, medication(s)/side effects and non-pharmacologic comfort measures Outcome: Progressing   Problem: Health Behavior/Discharge Planning: Goal: Ability to manage health-related needs will improve Outcome: Progressing   Problem: Clinical Measurements: Goal: Ability to maintain clinical measurements within normal limits will improve Outcome: Progressing Goal: Will remain free from infection Outcome: Progressing Goal: Diagnostic test results will improve Outcome: Progressing Goal: Respiratory complications will improve Outcome: Progressing Goal: Cardiovascular complication will be avoided Outcome: Progressing   Problem: Activity: Goal: Risk for activity intolerance will decrease Outcome: Progressing   Problem: Nutrition: Goal: Adequate nutrition will be maintained Outcome: Progressing   Problem: Coping: Goal: Level of anxiety will decrease Outcome: Progressing   Problem: Elimination: Goal: Will not experience complications related to bowel motility Outcome: Progressing Goal: Will not experience complications related to urinary retention Outcome: Progressing   Problem: Pain Managment: Goal: General experience of comfort will improve and/or be controlled Outcome: Progressing   Problem: Safety: Goal: Ability to remain free from injury will improve Outcome: Progressing   Problem: Skin Integrity: Goal: Risk for impaired skin integrity will decrease Outcome: Progressing   Problem: Education: Goal: Verbalization of understanding the information provided (i.e., activity precautions, restrictions, etc) will improve Outcome: Progressing Goal: Individualized Educational Video(s) Outcome: Progressing   Problem: Activity: Goal: Ability to ambulate and perform ADLs will improve Outcome: Progressing   Problem: Clinical Measurements: Goal: Postoperative  complications will be avoided or minimized Outcome: Progressing   Problem: Self-Concept: Goal: Ability to maintain and perform role responsibilities to the fullest extent possible will improve Outcome: Progressing   Problem: Pain Management: Goal: Pain level will decrease Outcome: Progressing   Problem: Education: Goal: Knowledge of risk factors and measures for prevention of condition will improve Outcome: Progressing   Problem: Coping: Goal: Psychosocial and spiritual needs will be supported Outcome: Progressing   Problem: Respiratory: Goal: Will maintain a patent airway Outcome: Progressing Goal: Complications related to the disease process, condition or treatment will be avoided or minimized Outcome: Progressing

## 2023-09-16 NOTE — Plan of Care (Signed)
Pt is alert and oriented x 4. Compliaints of tylenol ineffective at shift change. On call provider contacted. NP ordered tramadol. Med given and pain decreased and pt went to sleep. Pt awoke later requesting to have CPAP removed. Then called out later complaints of nausea. Ginger ale and snack provided. Pt had refused to eat supper. Snack and ginger ale effective for short period and pt called out in pain,nausea and reports of slightly dizziness. Pt questioned and Pt anxious. Ativan, Tylenol given. Vitals taken and stable. Pt encouraged to eat more crackers due she only at 2 crackers prior. Oxygen 2L/[place] applied due to pt refusal to use cpap. Respiratory ahd 2L into CPAP when assessed.  Upon reassessment pt asleep. Respirations even and unlabored.  Problem: Education: Goal: Knowledge of General Education information will improve Description: Including pain rating scale, medication(s)/side effects and non-pharmacologic comfort measures Outcome: Progressing   Problem: Health Behavior/Discharge Planning: Goal: Ability to manage health-related needs will improve Outcome: Progressing   Problem: Clinical Measurements: Goal: Ability to maintain clinical measurements within normal limits will improve Outcome: Progressing Goal: Will remain free from infection Outcome: Progressing Goal: Diagnostic test results will improve Outcome: Progressing Goal: Respiratory complications will improve Outcome: Progressing Goal: Cardiovascular complication will be avoided Outcome: Progressing   Problem: Activity: Goal: Risk for activity intolerance will decrease Outcome: Progressing   Problem: Nutrition: Goal: Adequate nutrition will be maintained Outcome: Progressing   Problem: Coping: Goal: Level of anxiety will decrease Outcome: Progressing   Problem: Elimination: Goal: Will not experience complications related to bowel motility Outcome: Progressing Goal: Will not experience complications related to  urinary retention Outcome: Progressing   Problem: Pain Managment: Goal: General experience of comfort will improve and/or be controlled Outcome: Progressing   Problem: Safety: Goal: Ability to remain free from injury will improve Outcome: Progressing   Problem: Skin Integrity: Goal: Risk for impaired skin integrity will decrease Outcome: Progressing   Problem: Education: Goal: Verbalization of understanding the information provided (i.e., activity precautions, restrictions, etc) will improve Outcome: Progressing Goal: Individualized Educational Video(s) Outcome: Progressing   Problem: Activity: Goal: Ability to ambulate and perform ADLs will improve Outcome: Progressing   Problem: Clinical Measurements: Goal: Postoperative complications will be avoided or minimized Outcome: Progressing   Problem: Self-Concept: Goal: Ability to maintain and perform role responsibilities to the fullest extent possible will improve Outcome: Progressing   Problem: Pain Management: Goal: Pain level will decrease Outcome: Progressing   Problem: Education: Goal: Knowledge of risk factors and measures for prevention of condition will improve Outcome: Progressing   Problem: Coping: Goal: Psychosocial and spiritual needs will be supported Outcome: Progressing   Problem: Respiratory: Goal: Will maintain a patent airway Outcome: Progressing Goal: Complications related to the disease process, condition or treatment will be avoided or minimized Outcome: Progressing

## 2023-09-16 NOTE — Progress Notes (Signed)
PROGRESS NOTE    Sarah Harper  ZOX:[id number] DOB: 05-Mar-1941 DOA: 09/05/2023 PCP: Judy Pimple, MD     Brief Narrative:  Sarah Harper is a 83 y.o. female with past medical history significant for HTN, hypothyroidism, dyslipidemia, GERD, osteopenia, OSA on CPAP who presented to [facility] ED on 1/15 with complaint of right hip pain.  Patient reports she tripped and fell onto her right side without loss of consciousness.  She was unable to ambulate or get up after event.  EMS was called to her home where there was noticeable shortening and rotation of the right leg.  Patient denied chest pain, no dizziness, no shortness of breath, no palpitations.  Patient was transported to the ED for further evaluation and management.   In the ED, temperature 99.3 F, HR 84, RR 18, BP 166/105, SpO2 95% on room air.  WBC of 7.2, hemoglobin 12.9, platelet count 185.  Sodium 126, potassium 3.2, chloride 93, CO2 21, glucose 106, BUN 9, creatinine 0.74.  TSH 1.538.  Chest x-ray with no active cardiopulmonary disease process.  X-ray right pelvis with acute impacted, angulated, displaced intertrochanteric fracture proximal right femur.  Right femur x-ray with intertrochanteric fracture right femoral neck.  Orthopedics was consulted.  TRH consulted for admission for further evaluation management of right femur fracture.  Patient underwent intramedullary nailing of the right hip 09/06/2023 by Dr. Carola Frost.  She tested positive for COVID on 09/08/2023 and is currently under isolation until she can discharge to skilled nursing facility.  New events last 24 hours / Subjective: Admits to some congestion.  Eager to discharge to skilled nursing facility tomorrow  Assessment & Plan:   Principal Problem:   Closed right hip fracture (HCC) Active Problems:   Hypothyroidism   GERD   Hyperlipidemia   OSA (obstructive sleep apnea)   GAD (generalized anxiety disorder)   Fall at home   Hypertension, essential   COVID-19 virus  infection   ABLA (acute blood loss anemia)  Right displaced, angulated, impacted intertrochanteric fracture proximal right femur -Following mechanical fall at home -Status post intramedullary nailing of the right hip on 09/06/2023 by Dr. Carola Frost -WBAT RLE -Pain control, Lovenox for DVT prophylaxis -SNF placement pending, can discharge 1/27 -Follow-up orthopedic surgery outpatient in 2 weeks  COVID -Completed 5 days of Paxlovid -Airborne isolation -Can discharge to [facility] 1/27  Acute postop blood loss anemia -Stable  Hypoosmolar hyponatremia -Likely secondary to SIADH versus recent use of Bactrim -Add salt tabs today  Hypokalemia -Replace  Hypertension -Amlodipine, metoprolol  Hypothyroidism -Synthroid  GERD -Protonix, Pepcid, Carafate  Anxiety -Ativan as needed  DVT prophylaxis:  SCDs Start: 09/06/23 1107 enoxaparin (LOVENOX) injection 40 mg Start: 09/05/23 1200  Code Status: Full code Family Communication: at bedside Disposition Plan: SNF Status is: Inpatient Remains inpatient appropriate because: Currently under isolation due to COVID.  Can discharge to SNF once isolation completed on 1/27    Antimicrobials:  Anti-infectives (From admission, onward)    Start     Dose/Rate Route Frequency Ordered Stop   09/08/23 2200  nirmatrelvir/ritonavir (PAXLOVID) 3 tablet        3 tablet Oral 2 times daily 09/08/23 1627 09/13/23 1028   09/06/23 1200  ceFAZolin (ANCEF) IVPB 2g/100 mL premix        2 g 200 mL/hr over 30 Minutes Intravenous Every 6 hours 09/06/23 1106 09/06/23 1915   09/06/23 0730  ceFAZolin (ANCEF) IVPB 2g/100 mL premix        2 g 200  mL/hr over 30 Minutes Intravenous On call to O.R. 09/06/23 0728 09/06/23 0839   09/05/23 1215  sulfamethoxazole-trimethoprim (BACTRIM DS) 800-160 MG per tablet 1 tablet        1 tablet Oral 2 times daily 09/05/23 1202 09/05/23 2208        Objective: Vitals:   09/15/23 2024 09/16/23 0236 09/16/23 0705 09/16/23 0804   BP:  (!) 149/75  (!) 149/70  Pulse: 70 80  90  Resp: 17 15  18   Temp:  97.7 F (36.5 C)  98.2 F (36.8 C)  TempSrc:  Oral    SpO2: 96% 95% 95% 100%  Weight:      Height:        Intake/Output Summary (Last 24 hours) at 09/16/2023 1133 Last data filed at 09/16/2023 0900 Gross per 24 hour  Intake 700 ml  Output --  Net 700 ml   Filed Weights   09/06/23 0717 09/08/23 0502  Weight: 59 kg 62.6 kg    Examination:  General exam: Appears calm and comfortable  Respiratory system: Respiratory effort normal. No respiratory distress. No conversational dyspnea.  Central nervous system: Alert and oriented Extremities: Symmetric in appearance  Psychiatry: Judgement and insight appear normal. Mood & affect appropriate.   Data Reviewed: I have personally reviewed following labs and imaging studies  CBC: Recent Labs  Lab 09/10/23 0452 09/13/23 0604  WBC 5.9 7.5  HGB 9.7* 10.0*  HCT 29.3* 29.5*  MCV 87.5 85.0  PLT 192 340   Basic Metabolic Panel: Recent Labs  Lab 09/12/23 0537 09/13/23 0604 09/14/23 0626 09/15/23 0553 09/16/23 0545  NA 124* 129* 133* 131* 127*  K 3.2* 3.4* 3.8 3.5 3.3*  CL 90* 95* 98 96* 90*  CO2 24 25 26 29 27   GLUCOSE 93 96 98 97 84  BUN 8 9 8 10 9   CREATININE 0.54 0.53 0.78 0.52 0.60  CALCIUM 8.3* 8.4* 8.7* 8.6* 8.7*  MG  --  1.7  --   --   --    GFR: Estimated Creatinine Clearance: 46.8 mL/min (by C-G formula based on SCr of 0.6 mg/dL). Liver Function Tests: No results for input(s): "AST", "ALT", "ALKPHOS", "BILITOT", "PROT", "ALBUMIN" in the last 168 hours. No results for input(s): "LIPASE", "AMYLASE" in the last 168 hours. No results for input(s): "AMMONIA" in the last 168 hours. Coagulation Profile: No results for input(s): "INR", "PROTIME" in the last 168 hours. Cardiac Enzymes: No results for input(s): "CKTOTAL", "CKMB", "CKMBINDEX", "TROPONINI" in the last 168 hours. BNP (last 3 results) No results for input(s): "PROBNP" in the last 8760  hours. HbA1C: No results for input(s): "HGBA1C" in the last 72 hours. CBG: No results for input(s): "GLUCAP" in the last 168 hours. Lipid Profile: No results for input(s): "CHOL", "HDL", "LDLCALC", "TRIG", "CHOLHDL", "LDLDIRECT" in the last 72 hours. Thyroid Function Tests: No results for input(s): "TSH", "T4TOTAL", "FREET4", "T3FREE", "THYROIDAB" in the last 72 hours. Anemia Panel: No results for input(s): "VITAMINB12", "FOLATE", "FERRITIN", "TIBC", "IRON", "RETICCTPCT" in the last 72 hours. Sepsis Labs: No results for input(s): "PROCALCITON", "LATICACIDVEN" in the last 168 hours.  Recent Results (from the past 240 hours)  SARS Coronavirus 2 by RT PCR (hospital order, performed in [facility] hospital lab) *cepheid single result test* Anterior Nasal Swab     Status: Abnormal   Collection Time: 09/08/23 11:15 AM   Specimen: Anterior Nasal Swab  Result Value Ref Range Status   SARS Coronavirus 2 by RT PCR POSITIVE (A) NEGATIVE Final  Comment: Performed at [facility], [address] [place], [place] [address]      Radiology Studies: No results found.    Scheduled Meds:  amLODipine  2.5 mg Oral Daily   enoxaparin (LOVENOX) injection  40 mg Subcutaneous Q24H   feeding supplement  237 mL Oral TID BM   fluticasone  2 spray Each Nare Daily   levothyroxine  75 mcg Oral Daily   melatonin  3 mg Oral QHS   metoprolol tartrate  12.5 mg Oral BID   pantoprazole  40 mg Oral Daily   sodium chloride  1 g Oral BID WC   Continuous Infusions:   LOS: 11 days   Time spent: 20 minutes   Noralee Stain, DO [facility] 09/16/2023, 11:33 AM   Available via Epic secure chat 7am-7pm After these hours, please refer to coverage provider listed on [email or web address]

## 2023-09-17 DIAGNOSIS — S72001A Fracture of unspecified part of neck of right femur, initial encounter for closed fracture: Secondary | ICD-10-CM | POA: Diagnosis not present

## 2023-09-17 LAB — BASIC METABOLIC PANEL
Anion gap: 8 (ref 5–15)
BUN: 10 mg/dL (ref 8–23)
CO2: 26 mmol/L (ref 22–32)
Calcium: 8.6 mg/dL — ABNORMAL LOW (ref 8.9–10.3)
Chloride: 93 mmol/L — ABNORMAL LOW (ref 98–111)
Creatinine, Ser: 0.69 mg/dL (ref 0.44–1.00)
GFR, Estimated: >60 mL/min (ref >60)
Glucose, Bld: 99 mg/dL (ref 70–99)
Potassium: 4 mmol/L (ref 3.5–5.1)
Sodium: 127 mmol/L — ABNORMAL LOW (ref 135–145)

## 2023-09-17 MED ORDER — LORAZEPAM 0.5 MG PO TABS: 0.5000 mg | ORAL_TABLET | Freq: Two times a day (BID) | ORAL | 0 refills | Status: DC | PRN

## 2023-09-17 MED ORDER — OXYCODONE-ACETAMINOPHEN 5-325 MG PO TABS: 1.0000 | ORAL_TABLET | ORAL | 0 refills | Status: DC | PRN

## 2023-09-17 MED ORDER — SODIUM CHLORIDE 1 G PO TABS: 1.0000 g | ORAL_TABLET | Freq: Two times a day (BID) | ORAL | 0 refills | Status: DC

## 2023-09-17 NOTE — Discharge Summary (Signed)
Physician Discharge Summary  Sarah Harper XBJ:[id number] DOB: [birth year] DOA: 09/05/2023  PCP: Judy Pimple, MD  Admit date: 09/05/2023 Discharge date: 09/17/2023  Admitted From: Home Disposition:  SNF   Recommendations for Outpatient Follow-up:  Follow up with PCP and orthopedic surgery  Discharge Condition: Stable CODE STATUS: Full  Diet recommendation: Regular   Brief/Interim Summary: Sarah Harper is a 83 y.o. female with past medical history significant for HTN, hypothyroidism, dyslipidemia, GERD, osteopenia, OSA on CPAP who presented to [facility] ED on 1/15 with complaint of right hip pain.  Patient reports she tripped and fell onto her right side without loss of consciousness.  She was unable to ambulate or get up after event.  EMS was called to her home where there was noticeable shortening and rotation of the right leg.  Patient denied chest pain, no dizziness, no shortness of breath, no palpitations.  Patient was transported to the ED for further evaluation and management.   In the ED, temperature 99.3 F, HR 84, RR 18, BP 166/105, SpO2 95% on room air.  WBC of 7.2, hemoglobin 12.9, platelet count 185.  Sodium 126, potassium 3.2, chloride 93, CO2 21, glucose 106, BUN 9, creatinine 0.74.  TSH 1.538.  Chest x-ray with no active cardiopulmonary disease process.  X-ray right pelvis with acute impacted, angulated, displaced intertrochanteric fracture proximal right femur.  Right femur x-ray with intertrochanteric fracture right femoral neck.  Orthopedics was consulted.  TRH consulted for admission for further evaluation management of right femur fracture.  Patient underwent intramedullary nailing of the right hip 09/06/2023 by Dr. Carola Frost.  She tested positive for COVID on 09/08/2023 and completed isolation and was discharge to skilled nursing facility.  Discharge Diagnoses:   Principal Problem:   Closed right hip fracture (HCC) Active Problems:   Hypothyroidism   GERD   Hyperlipidemia    OSA (obstructive sleep apnea)   GAD (generalized anxiety disorder)   Fall at home   Hypertension, essential   COVID-19 virus infection   ABLA (acute blood loss anemia)   Right displaced, angulated, impacted intertrochanteric fracture proximal right femur -Following mechanical fall at home -Status post intramedullary nailing of the right hip on 09/06/2023 by Dr. Carola Frost -WBAT RLE -Pain control, Lovenox for DVT prophylaxis -SNF placement  -Follow-up orthopedic surgery outpatient in 2 weeks   COVID -Completed 5 days of Paxlovid -Completed airborne isolation   Acute postop blood loss anemia -Stable   Hypoosmolar hyponatremia -Likely secondary to SIADH versus recent use of Bactrim -Continue salt tabs     Hypertension -Amlodipine, metoprolol   Hypothyroidism -Synthroid   GERD -Protonix, Pepcid, Carafate   Anxiety -Ativan as needed    Discharge Instructions  Discharge Instructions     Increase activity slowly   Complete by: As directed       Allergies as of 09/17/2023       Reactions   Aspirin Other (See Comments)   GI intolerance    Nsaids    GI discomfort and gastritis   Tolmetin Other (See Comments)   GI discomfort and gastritis        Medication List     STOP taking these medications    ALPRAZolam 0.25 MG tablet Commonly known as: XANAX   cephALEXin 500 MG capsule Commonly known as: KEFLEX   sulfamethoxazole-trimethoprim 800-160 MG tablet Commonly known as: BACTRIM DS       TAKE these medications    acetaminophen 500 MG tablet Commonly known as: TYLENOL Take 500 mg by  mouth every 6 (six) hours as needed for moderate pain (pain score 4-6).   amLODipine 5 MG tablet Commonly known as: NORVASC Take 1/2 tablet (2.5 mg) by mouth daily   butalbital-acetaminophen-caffeine 50-325-40 MG tablet Commonly known as: Fioricet Take 1 tablet by mouth every 6 (six) hours as needed.   cholecalciferol 25 MCG (1000 UNIT) tablet Commonly known as:  VITAMIN D3 Take 1,000 Units by mouth daily.   diclofenac sodium 1 % Gel Commonly known as: VOLTAREN APPLY 2 GRAMS TOPICALLY FOUR TIMES A DAY TO AFFECTED AREAS What changed: See the new instructions.   estradiol 0.1 MG/GM vaginal cream Commonly known as: ESTRACE INSERT SMALL AMOUNT (1 CM) VAGINALLY EVERY OTHER DAY   famotidine 40 MG tablet Commonly known as: PEPCID Take 40 mg by mouth daily as needed for heartburn or indigestion.   levothyroxine 75 MCG tablet Commonly known as: SYNTHROID Take 1 tablet (75 mcg total) by mouth daily.   linaclotide 145 MCG Caps capsule Commonly known as: Linzess Take 1 capsule (145 mcg total) by mouth as needed.   LORazepam 0.5 MG tablet Commonly known as: ATIVAN Take 1 tablet (0.5 mg total) by mouth 2 (two) times daily as needed for anxiety.   metoprolol tartrate 25 MG tablet Commonly known as: LOPRESSOR Take 0.5 tablets (12.5 mg total) by mouth 2 (two) times daily.   mometasone 50 MCG/ACT nasal spray Commonly known as: NASONEX Place 2 sprays into the nose daily as needed (Rhinitis).   omeprazole 40 MG capsule Commonly known as: PRILOSEC Take 1 capsule (40 mg total) by mouth daily.   oxyCODONE-acetaminophen 5-325 MG tablet Commonly known as: Percocet Take 1 tablet by mouth every 4 (four) hours as needed for severe pain (pain score 7-10).   sodium chloride 1 g tablet Take 1 tablet (1 g total) by mouth 2 (two) times daily with a meal.   sucralfate 1 g tablet Commonly known as: CARAFATE Take 1 g by mouth 4 (four) times daily as needed (Esophagitis).   SUPER B COMPLEX PO Take 1 tablet by mouth daily.        Follow-up Information     Myrene Galas, MD. Schedule an appointment as soon as possible for a visit in 2 week(s).   Specialty: Orthopedic Surgery Contact information: [address] [place] [address] [phone]         Judy Pimple, MD Follow up.   Specialties: Family Medicine, Radiology Contact  information: [address] [place] [address] [phone]                Allergies  Allergen Reactions   Aspirin Other (See Comments)    GI intolerance    Nsaids     GI discomfort and gastritis   Tolmetin Other (See Comments)    GI discomfort and gastritis    Procedures/Studies: DG FEMUR PORT, MIN 2 VIEWS RIGHT Result Date: 09/06/2023 CLINICAL DATA:  Hip fracture, postop. EXAM: RIGHT FEMUR PORTABLE 2 VIEW COMPARISON:  Preoperative imaging. FINDINGS: Femoral intramedullary nail with trans trochanteric and distal locking screw fixation traverse intertrochanteric femur fracture. Improved fracture alignment from preoperative imaging. Recent postsurgical change includes air and edema in the soft tissues. IMPRESSION: ORIF proximal femur fracture in improved alignment. Electronically Signed   By: Narda Rutherford M.D.   On: 09/06/2023 14:59   DG FEMUR, MIN 2 VIEWS RIGHT Result Date: 09/06/2023 CLINICAL DATA:  Open reduction and internal fixation of right femoral fracture. EXAM: RIGHT FEMUR 2 VIEWS; DG C-ARM  1-60 MIN-NO REPORT Radiation exposure index: 13.69 mGy. COMPARISON:  September 05, 2023. FINDINGS: Six intraoperative fluoroscopic images were obtained of the right hip. These demonstrate surgical internal fixation of proximal right femoral fracture. IMPRESSION: Fluoroscopic guidance provided during surgical internal fixation of proximal right femoral fracture. Electronically Signed   By: Lupita Raider M.D.   On: 09/06/2023 13:43   DG C-Arm 1-60 Min-No Report Result Date: 09/06/2023 Fluoroscopy was utilized by the requesting physician.  No radiographic interpretation.   DG FEMUR, MIN 2 VIEWS RIGHT Result Date: 09/05/2023 CLINICAL DATA:  Fall and right hip fracture. EXAM: RIGHT FEMUR 2 VIEWS COMPARISON:  Right hip radiograph dated 12/04/2023. FINDINGS: Comminuted appearing intertrochanteric fracture of the right femoral neck. The bones are osteopenic. No dislocation. The  soft tissues are unremarkable. IMPRESSION: Intertrochanteric fracture of the right femoral neck. Electronically Signed   By: Elgie Collard M.D.   On: 09/05/2023 12:29   DG Chest 1 View Result Date: 09/05/2023 CLINICAL DATA:  Right hip fracture EXAM: CHEST  1 VIEW COMPARISON:  11/10/2020 FINDINGS: The heart size and mediastinal contours are within normal limits. Aortic atherosclerosis. Both lungs are clear. The visualized skeletal structures are unremarkable. IMPRESSION: No active disease. Electronically Signed   By: Duanne Guess D.O.   On: 09/05/2023 12:25   DG Hip Unilat  With Pelvis 2-3 Views Right Result Date: 09/05/2023 CLINICAL DATA:  Fall onto right hip. EXAM: DG HIP (WITH OR WITHOUT PELVIS) 2-3V RIGHT COMPARISON:  None Available. FINDINGS: There is an acute impacted intertrochanteric fracture of the proximal right femur with fracture margins extending through the greater and lesser trochanters. There is approximately 1.8 cm of lateral displacement of the distal segment with apex varus angulation. The right femoral head is seated within the acetabulum. Evaluation of the sacrum is limited due to overlying bowel-gas. The sacroiliac joints and pubic symphysis otherwise appear anatomically aligned. IMPRESSION: Acute impacted, angulated, and displaced intertrochanteric fracture of the proximal right femur. Electronically Signed   By: Hart Robinsons M.D.   On: 09/05/2023 08:21      Discharge Exam: Vitals:   09/17/23 0353 09/17/23 0742  BP: (!) 145/76 134/69  Pulse: 95 95  Resp: 20   Temp: 98 F (36.7 C) 98.8 F (37.1 C)  SpO2:  98%    General: Pt is alert, awake, not in acute distress Cardiovascular: RRR, S1/S2 +, no edema Respiratory: CTA bilaterally, no wheezing, no rhonchi, no respiratory distress, no conversational dyspnea  Abdominal: Soft, NT, ND, bowel sounds + Extremities: no edema, no cyanosis Psych: Normal mood and affect, stable judgement and insight     The results  of significant diagnostics from this hospitalization (including imaging, microbiology, ancillary and laboratory) are listed below for reference.     Microbiology: Recent Results (from the past 240 hours)  SARS Coronavirus 2 by RT PCR (hospital order, performed in [facility] hospital lab) *cepheid single result test* Anterior Nasal Swab     Status: Abnormal   Collection Time: 09/08/23 11:15 AM   Specimen: Anterior Nasal Swab  Result Value Ref Range Status   SARS Coronavirus 2 by RT PCR POSITIVE (A) NEGATIVE Final    Comment: Performed at [facility], [address] [place], [place] [address]     Labs: BNP (last 3 results) No results for input(s): "BNP" in the last 8760 hours. Basic Metabolic Panel: Recent Labs  Lab 09/13/23 0604 09/14/23 0626 09/15/23 0553 09/16/23 0545 09/17/23 0514  NA 129* 133* 131* 127* 127*  K 3.4* 3.8 3.5 3.3* 4.0  CL 95* 98 96* 90* 93*  CO2 25 26 29 27 26   GLUCOSE 96 98 97 84 99  BUN 9 8 10 9 10   CREATININE 0.53 0.78 0.52 0.60 0.69  CALCIUM 8.4* 8.7* 8.6* 8.7* 8.6*  MG 1.7  --   --   --   --    Liver Function Tests: No results for input(s): "AST", "ALT", "ALKPHOS", "BILITOT", "PROT", "ALBUMIN" in the last 168 hours. No results for input(s): "LIPASE", "AMYLASE" in the last 168 hours. No results for input(s): "AMMONIA" in the last 168 hours. CBC: Recent Labs  Lab 09/13/23 0604  WBC 7.5  HGB 10.0*  HCT 29.5*  MCV 85.0  PLT 340   Cardiac Enzymes: No results for input(s): "CKTOTAL", "CKMB", "CKMBINDEX", "TROPONINI" in the last 168 hours. BNP: Invalid input(s): "POCBNP" CBG: No results for input(s): "GLUCAP" in the last 168 hours. D-Dimer No results for input(s): "DDIMER" in the last 72 hours. Hgb A1c No results for input(s): "HGBA1C" in the last 72 hours. Lipid Profile No results for input(s): "CHOL", "HDL", "LDLCALC", "TRIG", "CHOLHDL", "LDLDIRECT" in the last 72 hours. Thyroid function studies No results for input(s): "TSH",  "T4TOTAL", "T3FREE", "THYROIDAB" in the last 72 hours.  Invalid input(s): "FREET3" Anemia work up No results for input(s): "VITAMINB12", "FOLATE", "FERRITIN", "TIBC", "IRON", "RETICCTPCT" in the last 72 hours. Urinalysis    Component Value Date/Time   COLORURINE STRAW (A) 11/10/2020 2130   APPEARANCEUR CLEAR (A) 11/10/2020 2130   LABSPEC 1.003 (L) 11/10/2020 2130   PHURINE 8.0 11/10/2020 2130   GLUCOSEU NEGATIVE 11/10/2020 2130   HGBUR NEGATIVE 11/10/2020 2130   HGBUR negative 10/06/2008 1447   BILIRUBINUR Negative 09/03/2023 0948   KETONESUR NEGATIVE 11/10/2020 2130   PROTEINUR Positive (A) 09/03/2023 0948   PROTEINUR NEGATIVE 11/10/2020 2130   UROBILINOGEN 0.2 09/03/2023 0948   UROBILINOGEN 0.2 10/06/2008 1447   NITRITE Negative 09/03/2023 0948   NITRITE NEGATIVE 11/10/2020 2130   LEUKOCYTESUR Moderate (2+) (A) 09/03/2023 0948   LEUKOCYTESUR NEGATIVE 11/10/2020 2130   Sepsis Labs Recent Labs  Lab 09/13/23 0604  WBC 7.5   Microbiology Recent Results (from the past 240 hours)  SARS Coronavirus 2 by RT PCR (hospital order, performed in [facility] hospital lab) *cepheid single result test* Anterior Nasal Swab     Status: Abnormal   Collection Time: 09/08/23 11:15 AM   Specimen: Anterior Nasal Swab  Result Value Ref Range Status   SARS Coronavirus 2 by RT PCR POSITIVE (A) NEGATIVE Final    Comment: Performed at [facility], [address], [place], [place] [address]     Patient was seen and examined on the day of discharge and was found to be in stable condition. Time coordinating discharge: 35 minutes including assessment and coordination of care, as well as examination of the patient.   SIGNED:  Noralee Stain, DO [facility] 09/17/2023, 10:48 AM

## 2023-09-17 NOTE — Plan of Care (Signed)
Pt is alert and oriented x 4. Vitals stable. Ativan and tylenol given x 1. Plan to d/c today per SW notes.  Problem: Education: Goal: Knowledge of General Education information will improve Description: Including pain rating scale, medication(s)/side effects and non-pharmacologic comfort measures Outcome: Progressing   Problem: Health Behavior/Discharge Planning: Goal: Ability to manage health-related needs will improve Outcome: Progressing   Problem: Clinical Measurements: Goal: Ability to maintain clinical measurements within normal limits will improve Outcome: Progressing Goal: Will remain free from infection Outcome: Progressing Goal: Diagnostic test results will improve Outcome: Progressing Goal: Respiratory complications will improve Outcome: Progressing Goal: Cardiovascular complication will be avoided Outcome: Progressing   Problem: Activity: Goal: Risk for activity intolerance will decrease Outcome: Progressing   Problem: Nutrition: Goal: Adequate nutrition will be maintained Outcome: Progressing   Problem: Coping: Goal: Level of anxiety will decrease Outcome: Progressing   Problem: Elimination: Goal: Will not experience complications related to bowel motility Outcome: Progressing Goal: Will not experience complications related to urinary retention Outcome: Progressing   Problem: Pain Managment: Goal: General experience of comfort will improve and/or be controlled Outcome: Progressing   Problem: Safety: Goal: Ability to remain free from injury will improve Outcome: Progressing   Problem: Skin Integrity: Goal: Risk for impaired skin integrity will decrease Outcome: Progressing   Problem: Education: Goal: Verbalization of understanding the information provided (i.e., activity precautions, restrictions, etc) will improve Outcome: Progressing Goal: Individualized Educational Video(s) Outcome: Progressing   Problem: Activity: Goal: Ability to ambulate  and perform ADLs will improve Outcome: Progressing   Problem: Clinical Measurements: Goal: Postoperative complications will be avoided or minimized Outcome: Progressing   Problem: Self-Concept: Goal: Ability to maintain and perform role responsibilities to the fullest extent possible will improve Outcome: Progressing   Problem: Pain Management: Goal: Pain level will decrease Outcome: Progressing   Problem: Education: Goal: Knowledge of risk factors and measures for prevention of condition will improve Outcome: Progressing   Problem: Coping: Goal: Psychosocial and spiritual needs will be supported Outcome: Progressing   Problem: Respiratory: Goal: Will maintain a patent airway Outcome: Progressing Goal: Complications related to the disease process, condition or treatment will be avoided or minimized Outcome: Progressing

## 2023-09-17 NOTE — TOC Transition Note (Signed)
Transition of Care [facility]) - Discharge Note   Patient Details  Name: Sarah Harper MRN: [id number] Date of Birth: July 07, 1941  Transition of Care [facility]) CM/SW Contact:  Lorri Frederick, LCSW Phone Number: 09/17/2023, 11:37 AM   Clinical Narrative:   Pt [facility], [place], room 704.  RN call report to [phone].      Final next level of care: Skilled Nursing Facility Barriers to Discharge: Barriers Resolved   Patient Goals and CMS Choice Patient states their goals for this hospitalization and ongoing recovery are:: Rehab CMS Medicare.gov Compare Post Acute Care list provided to:: Patient Choice offered to / list presented to : Patient [place] ownership interest in [facility].provided to:: Patient    Discharge Placement              Patient chooses bed at:  ([facility]) Patient to be transferred to facility by: ptar Name of family member notified: husband Erland in room Patient and family notified of of transfer: 09/17/23  Discharge Plan and Services Additional resources added to the After Visit Summary for   In-house Referral: Clinical Social Work   Post Acute Care Choice: Skilled Nursing Facility                               Social Drivers of Health (SDOH) Interventions SDOH Screenings   Food Insecurity: No Food Insecurity (09/05/2023)  Housing: High Risk (09/05/2023)  Transportation Needs: No Transportation Needs (09/05/2023)  Utilities: At Risk (09/05/2023)  Alcohol Screen: Low Risk  (02/12/2023)  Depression (PHQ2-9): Low Risk  (08/08/2023)  Financial Resource Strain: Low Risk  (02/12/2023)  Physical Activity: Insufficiently Active (02/12/2023)  Social Connections: Moderately Integrated (09/05/2023)  Stress: No Stress Concern Present (02/12/2023)  Tobacco Use: Low Risk  (09/06/2023)     Readmission Risk Interventions     No data to display

## 2023-09-17 NOTE — Progress Notes (Signed)
Physical Therapy Treatment Patient Details Name: Sarah Harper MRN: [id number] DOB: 03-09-1941 Today's Date: 09/17/2023   History of Present Illness Pt is an 83 y.o. female who presents to [facility] 09/05/23 after tripping and falling on R side with no LOC. Pt admitted w/ R intertrochanteric fx of R femoral neck, s/p IMN 09/06/23.  Incidentally + COVID 1/18. PMH significant for HTN, osteopenia, CVA, syncopal episodes, TIA, DDD.    PT Comments  Pt progressing towards her physical therapy goals. Initiated session with bed level exercises for RLE ROM/strengthening. Pt able to transition to edge of bed without physical assist. Transferring to standing with min assist and able to walk multi-directionally with a RW and CGA. Further distance limited due to pt dizziness; BP stable. No nystagmus noted with smooth pursuits, but pt would benefit from further vestibular testing.   If plan is discharge home, recommend the following: A lot of help with bathing/dressing/bathroom;Assistance with cooking/housework;Assist for transportation;Help with stairs or ramp for entrance;A little help with walking and/or transfers   Can travel by private vehicle     No  Equipment Recommendations  Rolling walker (2 wheels);BSC/3in1    Recommendations for Other Services       Precautions / Restrictions Precautions Precautions: Fall Restrictions Weight Bearing Restrictions Per Provider Order: Yes RLE Weight Bearing Per Provider Order: Weight bearing as tolerated     Mobility  Bed Mobility Overal bed mobility: Needs Assistance Bed Mobility: Supine to Sit     Supine to sit: Contact guard          Transfers Overall transfer level: Needs assistance Equipment used: Rolling walker (2 wheels), 2 person hand held assist Transfers: Sit to/from Stand Sit to Stand: Min assist           General transfer comment: Verbal cues for hand placement, minA to rise from edge of bed    Ambulation/Gait Ambulation/Gait  assistance: Contact guard assist Gait Distance (Feet): 5 Feet Assistive device: Rolling walker (2 wheels) Gait Pattern/deviations: Decreased stride length, Decreased dorsiflexion - right, Decreased dorsiflexion - left, Step-to pattern Gait velocity: decreased Gait velocity interpretation: <1.31 ft/sec, indicative of household ambulator   General Gait Details: Slow, hesitant steps forwards, laterally, backwards with decreased bilateral foot clearance.   Stairs             Wheelchair Mobility     Tilt Bed    Modified Rankin (Stroke Patients Only)       Balance Overall balance assessment: Needs assistance Sitting-balance support: No upper extremity supported, Feet supported Sitting balance-Leahy Scale: Good     Standing balance support: Bilateral upper extremity supported, Reliant on assistive device for balance Standing balance-Leahy Scale: Poor Standing balance comment: relies on BUE and external support                            Cognition Arousal: Alert Behavior During Therapy: WFL for tasks assessed/performed Overall Cognitive Status: Impaired/Different from baseline Area of Impairment: Memory, Awareness, Problem solving, Orientation                     Memory: Decreased short-term memory     Awareness: Emergent Problem Solving: Slow processing, Difficulty sequencing, Requires verbal cues          Exercises General Exercises - Lower Extremity Quad Sets: Right, 10 reps, Supine, AROM Heel Slides: Right, 10 reps, Supine, AROM Hip ABduction/ADduction: Right, 10 reps, Supine, AAROM    General Comments  Pertinent Vitals/Pain Pain Assessment Pain Assessment: Faces Faces Pain Scale: Hurts little more Pain Location: R hip (sore) Pain Descriptors / Indicators: Sore Pain Intervention(s): Monitored during session    Home Living                          Prior Function            PT Goals (current goals can now be  found in the care plan section) Acute Rehab PT Goals Potential to Achieve Goals: Good    Frequency    Min 1X/week      PT Plan      Co-evaluation              AM-PAC PT "6 Clicks" Mobility   Outcome Measure  Help needed turning from your back to your side while in a flat bed without using bedrails?: A Little Help needed moving from lying on your back to sitting on the side of a flat bed without using bedrails?: A Little Help needed moving to and from a bed to a chair (including a wheelchair)?: A Little Help needed standing up from a chair using your arms (e.g., wheelchair or bedside chair)?: A Little Help needed to walk in hospital room?: Total Help needed climbing 3-5 steps with a railing? : Total 6 Click Score: 14    End of Session Equipment Utilized During Treatment: Gait belt Activity Tolerance: Patient tolerated treatment well Patient left: in bed;with call bell/phone within reach Nurse Communication: Mobility status PT Visit Diagnosis: Unsteadiness on feet (R26.81);Pain Pain - Right/Left: Right Pain - part of body: Hip     Time: 9563-8756 PT Time Calculation (min) (ACUTE ONLY): 27 min  Charges:    $Therapeutic Activity: 23-37 mins PT General Charges $$ ACUTE PT VISIT: 1 Visit                     Lillia Pauls, PT, DPT Acute Rehabilitation Services Office [phone]    Norval Morton 09/17/2023, 3:10 PM

## 2023-09-17 NOTE — Progress Notes (Signed)
Report given to [facility].

## 2023-09-17 NOTE — TOC Progression Note (Signed)
Transition of Care [facility]) - Progression Note    Patient Details  Name: Sarah Harper MRN: [id number] Date of Birth: [birth year]  Transition of Care [facility]) CM/SW Contact  Lorri Frederick, LCSW Phone Number: 09/17/2023, 10:04 AM  Clinical Narrative:   CSW confirmed with [facility] that they can receive pt today.  Pt does have CPAP at home, CSW confirmed she can bring this to SNF.  MD informed    Expected Discharge Plan: Skilled Nursing Facility Barriers to Discharge: Insurance Authorization, Continued Medical Work up, SNF Pending bed offer  Expected Discharge Plan and Services In-house Referral: Clinical Social Work   Post Acute Care Choice: Skilled Nursing Facility Living arrangements for the past 2 months: Single Family Home                                       Social Determinants of Health (SDOH) Interventions SDOH Screenings   Food Insecurity: No Food Insecurity (09/05/2023)  Housing: High Risk (09/05/2023)  Transportation Needs: No Transportation Needs (09/05/2023)  Utilities: At Risk (09/05/2023)  Alcohol Screen: Low Risk  (02/12/2023)  Depression (PHQ2-9): Low Risk  (08/08/2023)  Financial Resource Strain: Low Risk  (02/12/2023)  Physical Activity: Insufficiently Active (02/12/2023)  Social Connections: Moderately Integrated (09/05/2023)  Stress: No Stress Concern Present (02/12/2023)  Tobacco Use: Low Risk  (09/06/2023)    Readmission Risk Interventions     No data to display

## 2023-09-20 ENCOUNTER — Ambulatory Visit: Payer: Medicare HMO | Admitting: Family Medicine

## 2023-10-04 ENCOUNTER — Telehealth: Payer: Self-pay

## 2023-10-04 NOTE — Transitions of Care (Post Inpatient/ED Visit) (Signed)
10/04/2023  Name: Sarah Harper MRN: [id number] DOB: March 25, 1941  Today's TOC FU Call Status: Today's TOC FU Call Status:: Successful TOC FU Call Completed TOC FU Call Complete Date: 10/04/23 Patient's Name and Date of Birth confirmed.  Transition Care Management Follow-up Telephone Call Date of Discharge: 10/03/23 Discharge Facility: Other [facility]) Name of Other ([facility]) [facility]charge Facility: [facility] Type of Discharge: Inpatient Admission Primary Inpatient Discharge Diagnosis:: UTI How have you been since you were released from the hospital?: Better Any questions or concerns?: No  Items Reviewed: Did you receive and understand the discharge instructions provided?: Yes Medications obtained,verified, and reconciled?: Yes (Medications Reviewed) Any new allergies since your discharge?: No Dietary orders reviewed?: Yes Do you have support at home?: Yes People in Home: spouse  Medications Reviewed Today: Medications Reviewed Today     Reviewed by Karena Addison, LPN (Licensed Practical Nurse) on 10/04/23 at [phone]  Med List Status: <None>   Medication Order Taking? Sig Documenting Provider Last Dose Status Informant  acetaminophen (TYLENOL) 500 MG tablet [id number] No Take 500 mg by mouth every 6 (six) hours as needed for moderate pain (pain score 4-6). [provider] 09/04/2023 Active Self, Pharmacy Records  amLODipine [facility]) 5 MG tablet [id number] No Take 1/2 tablet (2.5 mg) by mouth daily Tower, Audrie Gallus, MD 09/04/2023 Active Self, Pharmacy Records  B Complex-C (SUPER B COMPLEX PO) [id number] No Take 1 tablet by mouth daily. [provider] 09/04/2023 Active Self, Pharmacy Records  butalbital-acetaminophen-caffeine (FIORICET) 50-325-40 MG tablet [id number] No Take 1 tablet by mouth every 6 (six) hours as needed. Tower, Audrie Gallus, MD Taking Active Self, Pharmacy Records           Med Note Jola Schmidt   Wed Sep 05, 2023  8:06 AM) >30 days    cholecalciferol (VITAMIN D) 25 MCG (1000 UNIT) tablet [id number] No Take 1,000 Units by mouth daily. [provider] 09/04/2023 Active Self, Pharmacy Records  diclofenac sodium (VOLTAREN) 1 % GEL [id number] No APPLY 2 GRAMS TOPICALLY FOUR TIMES A DAY TO AFFECTED AREAS  Patient taking differently: Apply 2 g topically 2 (two) times daily.   Tower, Audrie Gallus, MD 09/04/2023 Active Self, Pharmacy Records  estradiol (ESTRACE) 0.1 MG/GM vaginal cream [id number] No INSERT SMALL AMOUNT (1 CM) VAGINALLY EVERY OTHER DAY Tower, Audrie Gallus, MD Past Week Active Self, Pharmacy Records  famotidine (PEPCID) 40 MG tablet [id number] No Take 40 mg by mouth daily as needed for heartburn or indigestion. [provider] Past Month Active Self, Pharmacy Records  levothyroxine (SYNTHROID) 75 MCG tablet [id number] No Take 1 tablet (75 mcg total) by mouth daily. Tower, Audrie Gallus, MD 09/04/2023 Active Self, Pharmacy Records  linaclotide [facility]) 145 MCG CAPS capsule [id number] No Take 1 capsule (145 mcg total) by mouth as needed. Tower, Audrie Gallus, MD Taking Active Self, Pharmacy Records           Med Note Jola Schmidt   Wed Sep 05, 2023  8:05 AM) >30 days   LORazepam (ATIVAN) 0.5 MG tablet [id number]  Take 1 tablet (0.5 mg total) by mouth 2 (two) times daily as needed for anxiety. Noralee Stain, DO  Active   metoprolol tartrate (LOPRESSOR) 25 MG tablet [id number] No Take 0.5 tablets (12.5 mg total) by mouth 2 (two) times daily. Tower, Audrie Gallus, MD 09/04/2023 Active Self, Pharmacy Records  mometasone (NASONEX) 50 MCG/ACT nasal spray [id number] No Place 2 sprays into the nose daily as needed (Rhinitis). [provider] Taking Active  Self, Pharmacy Records           Med Note Jola Schmidt   Wed Sep 05, 2023  8:04 AM) >30 days  omeprazole (PRILOSEC) 40 MG capsule [id number] No Take 1 capsule (40 mg total) by mouth daily. Tower, Audrie Gallus, MD 09/04/2023 Active Self, Pharmacy Records  oxyCODONE-acetaminophen  (PERCOCET) 5-325 MG tablet [id number]  Take 1 tablet by mouth every 4 (four) hours as needed for severe pain (pain score 7-10). Noralee Stain, DO  Active   sodium chloride 1 g tablet [id number]  Take 1 tablet (1 g total) by mouth 2 (two) times daily with a meal. Noralee Stain, DO  Active   sucralfate (CARAFATE) 1 g tablet [id number] No Take 1 g by mouth 4 (four) times daily as needed (Esophagitis). [provider] Taking Active Self, Pharmacy Records            Home Care and Equipment/Supplies: Were Home Health Services Ordered?: Yes Name of Home Health Agency:: unknown Has Agency set up a time to come to your home?: No Any new equipment or medical supplies ordered?: NA  Functional Questionnaire: Do you need assistance with bathing/showering or dressing?: No Do you need assistance with meal preparation?: Yes Do you need assistance with eating?: No Do you have difficulty maintaining continence: No Do you need assistance with getting out of bed/getting out of a chair/moving?: No Do you have difficulty managing or taking your medications?: No  Follow up appointments reviewed: PCP Follow-up appointment confirmed?: No (declined, will call back to schedule) MD Provider Line [phone] Given: No Specialist Hospital Follow-up appointment confirmed?: NA Do you need transportation to your follow-up appointment?: No Do you understand care options if your condition(s) worsen?: Yes-patient verbalized understanding    SIGNATURE Karena Addison, LPN [facility] Nurse Health Advisor Direct Dial [phone]

## 2023-10-08 NOTE — Telephone Encounter (Signed)
Copied from CRM [id number]. Topic: Clinical - Home Health Verbal Orders >> Oct 08, 2023 12:52 PM Tiffany H wrote: Caller/Agency: Flora with [facility] Callback Number: [phone] Service Requested: Physical Therapy Frequency: 2x 4 weeks 1x 4 weeks effective 10/08/23 Any new concerns about the patient? No.   Please assist.

## 2023-10-08 NOTE — Telephone Encounter (Signed)
Please ok that verbal order  

## 2023-10-09 NOTE — Telephone Encounter (Signed)
VO given to [place].

## 2023-10-15 DIAGNOSIS — I10 Essential (primary) hypertension: Secondary | ICD-10-CM | POA: Diagnosis not present

## 2023-10-15 DIAGNOSIS — B9682 Vibrio vulnificus as the cause of diseases classified elsewhere: Secondary | ICD-10-CM | POA: Diagnosis not present

## 2023-10-15 DIAGNOSIS — H81391 Other peripheral vertigo, right ear: Secondary | ICD-10-CM

## 2023-10-15 DIAGNOSIS — G43909 Migraine, unspecified, not intractable, without status migrainosus: Secondary | ICD-10-CM

## 2023-10-15 DIAGNOSIS — K589 Irritable bowel syndrome without diarrhea: Secondary | ICD-10-CM

## 2023-10-15 DIAGNOSIS — E039 Hypothyroidism, unspecified: Secondary | ICD-10-CM

## 2023-10-15 DIAGNOSIS — N3000 Acute cystitis without hematuria: Secondary | ICD-10-CM | POA: Diagnosis not present

## 2023-10-15 DIAGNOSIS — G4730 Sleep apnea, unspecified: Secondary | ICD-10-CM

## 2023-10-15 DIAGNOSIS — K219 Gastro-esophageal reflux disease without esophagitis: Secondary | ICD-10-CM

## 2023-10-15 DIAGNOSIS — M5136 Other intervertebral disc degeneration, lumbar region with discogenic back pain only: Secondary | ICD-10-CM

## 2023-10-15 DIAGNOSIS — M80051D Age-related osteoporosis with current pathological fracture, right femur, subsequent encounter for fracture with routine healing: Secondary | ICD-10-CM | POA: Diagnosis not present

## 2023-10-15 DIAGNOSIS — G3184 Mild cognitive impairment, so stated: Secondary | ICD-10-CM

## 2023-10-16 ENCOUNTER — Telehealth: Payer: Self-pay | Admitting: Family Medicine

## 2023-10-16 NOTE — Telephone Encounter (Signed)
 Copied from CRM [id number]. Topic: Clinical - Home Health Verbal Orders >> Oct 16, 2023  4:23 PM Tiffany H wrote: Caller/Agency: Orvan July with [facility]  Callbac Number: [phone] Service Requested: Physical Therapy Frequency: 1x week 8 weeks Any new concerns about the patient? Yes

## 2023-10-16 NOTE — Telephone Encounter (Signed)
 Please ok those verbal orders and let me know what their concerns are

## 2023-10-17 NOTE — Telephone Encounter (Signed)
 Left VM giving VO, also advised Delorise Shiner if there is any concerns to please call us back and let us know

## 2023-10-18 ENCOUNTER — Encounter: Payer: Self-pay | Admitting: Family Medicine

## 2023-10-18 ENCOUNTER — Ambulatory Visit (INDEPENDENT_AMBULATORY_CARE_PROVIDER_SITE_OTHER): Payer: Medicare HMO | Admitting: Family Medicine

## 2023-10-18 VITALS — BP 134/70 | HR 71 | Temp 98.2°F | Ht 64.0 in | Wt 127.1 lb

## 2023-10-18 DIAGNOSIS — S72001A Fracture of unspecified part of neck of right femur, initial encounter for closed fracture: Secondary | ICD-10-CM

## 2023-10-18 DIAGNOSIS — L989 Disorder of the skin and subcutaneous tissue, unspecified: Secondary | ICD-10-CM | POA: Diagnosis not present

## 2023-10-18 DIAGNOSIS — E871 Hypo-osmolality and hyponatremia: Secondary | ICD-10-CM | POA: Diagnosis not present

## 2023-10-18 DIAGNOSIS — I10 Essential (primary) hypertension: Secondary | ICD-10-CM | POA: Diagnosis not present

## 2023-10-18 DIAGNOSIS — D500 Iron deficiency anemia secondary to blood loss (chronic): Secondary | ICD-10-CM | POA: Diagnosis not present

## 2023-10-18 DIAGNOSIS — S81811A Laceration without foreign body, right lower leg, initial encounter: Secondary | ICD-10-CM | POA: Diagnosis not present

## 2023-10-18 DIAGNOSIS — E870 Hyperosmolality and hypernatremia: Secondary | ICD-10-CM | POA: Insufficient documentation

## 2023-10-18 LAB — CBC WITH DIFFERENTIAL/PLATELET
Basophils Absolute: 0.1 10*3/uL (ref 0.0–0.1)
Basophils Relative: 1.2 % (ref 0.0–3.0)
Eosinophils Absolute: 0.2 10*3/uL (ref 0.0–0.7)
Eosinophils Relative: 3.1 % (ref 0.0–5.0)
HCT: 33 % — ABNORMAL LOW (ref 36.0–46.0)
Hemoglobin: 10.9 g/dL — ABNORMAL LOW (ref 12.0–15.0)
Lymphocytes Relative: 30.5 % (ref 12.0–46.0)
Lymphs Abs: 1.7 10*3/uL (ref 0.7–4.0)
MCHC: 32.9 g/dL (ref 30.0–36.0)
MCV: 91.7 fL (ref 78.0–100.0)
Monocytes Absolute: 0.5 10*3/uL (ref 0.1–1.0)
Monocytes Relative: 10.1 % (ref 3.0–12.0)
Neutro Abs: 3 10*3/uL (ref 1.4–7.7)
Neutrophils Relative %: 55.1 % (ref 43.0–77.0)
Platelets: 340 10*3/uL (ref 150.0–400.0)
RBC: 3.6 Mil/uL — ABNORMAL LOW (ref 3.87–5.11)
RDW: 16.8 % — ABNORMAL HIGH (ref 11.5–15.5)
WBC: 5.4 10*3/uL (ref 4.0–10.5)

## 2023-10-18 LAB — BASIC METABOLIC PANEL
BUN: 12 mg/dL (ref 6–23)
CO2: 29 meq/L (ref 19–32)
Calcium: 9 mg/dL (ref 8.4–10.5)
Chloride: 102 meq/L (ref 96–112)
Creatinine, Ser: 0.63 mg/dL (ref 0.40–1.20)
GFR: 82.65 mL/min (ref >60.00)
Glucose, Bld: 83 mg/dL (ref 70–99)
Potassium: 3.4 meq/L — ABNORMAL LOW (ref 3.5–5.1)
Sodium: 137 meq/L (ref 135–145)

## 2023-10-18 NOTE — Assessment & Plan Note (Signed)
 Today area of previous cellulitis has scab/keratotic area  This may overlie a varicose vein  Cannot get to heal  ? If this hyperkeratotic area may be from something else or solar change  Ref to dermatology  Encouraged to keep clean with soap and water  Aquaphor prn  Try not to traumatize area

## 2023-10-18 NOTE — Assessment & Plan Note (Signed)
 This healed but there is hyperkeratotic area that persists Ref to dermatology  No signs of infection

## 2023-10-18 NOTE — Patient Instructions (Addendum)
 Keep the right leg wound clean with soap and water  Use some aquaphor or vaseline on the scab  If more redness or pain let me know  Any elevation is helpful   I will refer to dermatology  Call their office for an appointment   Labs today   Keep working with PT

## 2023-10-18 NOTE — Progress Notes (Signed)
 Subjective:    Patient ID: Sarah Harper, female    DOB: 1940/12/21, 83 y.o.   MRN: [id number]  HPI  Wt Readings from Last 3 Encounters:  10/18/23 127 lb 2 oz (57.7 kg)  09/08/23 138 lb 0.1 oz (62.6 kg)  08/21/23 133 lb (60.3 kg)   21.82 kg/m  Vitals:   10/18/23 1023  BP: 134/70  Pulse: 71  Temp: 98.2 F (36.8 C)  SpO2: 98%   Pt presents for hospital follow up and also re check foot   Hosp from 1/15 to 1/27 for closed r hip fracture  Had surgery on 09/06/23 with IM nailing by Dr Carola Frost Then contracted covid 1/18 (treated with paxlovid) Was d/c to SNF after stable   Getting PT  Therapists think she is getting around well  Hard to tell pain from hip from right back pain from old disk problem  Cannot sit for very long   Was able to wash hair recently   Gradually getting better   Her old wound from injury from June/skin tear - has been more swollen and bothersome  Sore to the touch and a little scabbed    HTN bp is stable today  No cp or palpitations or headaches or edema  No side effects to medicines  BP Readings from Last 3 Encounters:  10/18/23 134/70  09/17/23 134/69  08/21/23 124/72    Amlodipine 2.5 mg daily  Metoprolol 12.5 mg bid    Lab Results  Component Value Date   NA 127 (L) 09/17/2023   K 4.0 09/17/2023   CO2 26 09/17/2023   GLUCOSE 99 09/17/2023   BUN 10 09/17/2023   CREATININE 0.69 09/17/2023   CALCIUM 8.6 (L) 09/17/2023   GFR 80.97 08/08/2023   EGFR 82 12/08/2020   GFRNONAA >60 09/17/2023   Has baseline hyponatremia  Lab Results  Component Value Date   WBC 7.5 09/13/2023   HGB 10.0 (L) 09/13/2023   HCT 29.5 (L) 09/13/2023   MCV 85.0 09/13/2023   PLT 340 09/13/2023    Lab Results  Component Value Date   TSH 1.538 09/05/2023      Patient Active Problem List   Diagnosis Date Noted   Skin lesion of right leg 10/18/2023   Hyponatremia 10/18/2023   Blood loss anemia 09/13/2023   Closed right hip fracture (HCC)  09/05/2023   Abnormal urinalysis 09/02/2023   Acute cystitis 08/21/2023   Current use of proton pump inhibitor 03/13/2023   Pain in joint, lower leg 01/30/2023   Senile purpura (HCC) 12/26/2022   Skin tear of right lower leg without complication 12/26/2022   Dysuria 10/21/2021   Elevated alkaline phosphatase level 03/11/2021   Palpitations 11/10/2020   Migraine    Hypertension, essential    H/O: CVA (cerebrovascular accident) 11/08/2020   Headache 09/26/2019   Fall at home 06/02/2019   Colon cancer screening 02/05/2018   Degenerative joint disease (DJD) of lumbar spine 05/02/2017   Internal hemorrhoids 05/02/2017   Generalized osteoarthritis of hand 12/11/2016   GAD (generalized anxiety disorder) 12/11/2016   Estrogen deficiency 05/11/2016   Encounter for screening mammogram for breast cancer 05/11/2016   OSA (obstructive sleep apnea) 03/06/2016   Fatigue 01/25/2016   Left knee pain 10/18/2015   Snoring 10/18/2015   Routine general medical examination at a health care facility 02/28/2015   Encounter for Medicare annual wellness exam 02/06/2013   Hyperlipidemia 08/27/2012   Irregular heart beat 08/05/2012   Encounter for gynecological examination 12/20/2010  Osteopenia 12/17/2009   BACK PAIN, LUMBAR 07/02/2009   IRRITABLE BOWEL SYNDROME 04/02/2009   Asymptomatic postmenopausal status 12/10/2008   Hypothyroidism 12/05/2007   Meniere's disease 12/05/2007   GERD 12/05/2007   Past Medical History:  Diagnosis Date   Anxiety    Blood loss anemia 09/13/2023   Cataract 1997   Colon cancer screening 02/05/2018   DDD (degenerative disc disease)    chronic back pain   ETD (eustachian tube dysfunction)    GERD (gastroesophageal reflux disease)    Hypertension 2022   IBS (irritable bowel syndrome)    Meniere's disease    Migraine    still gets visual aura from time to time   Mild cognitive impairment    Osteoporosis    Sleep apnea    CPAP   Stroke (HCC) 2022   Syncopal  episodes    after work-up - possible seizures?   Thyroid disease    hypothyroid   TIA (transient ischemic attack) 11/09/2020   Past Surgical History:  Procedure Laterality Date   APPENDECTOMY     CATARACT EXTRACTION W/PHACO Right 05/21/2019   Procedure: CATARACT EXTRACTION PHACO AND INTRAOCULAR LENS PLACEMENT (IOC) RIGHT panoptix lens  00:35.0  21.7%  7.61;  Surgeon: Lockie Mola, MD;  Location: [facility];  Service: Ophthalmology;  Laterality: Right;  sleep apnea requests early   CATARACT EXTRACTION W/PHACO Left 06/11/2019   Procedure: CATARACT EXTRACTION PHACO AND INTRAOCULAR LENS PLACEMENT (IOC) LEFT PANOPTIX TORIC LENS  00:50.0  20.3%  10.21;  Surgeon: Lockie Mola, MD;  Location: [facility];  Service: Ophthalmology;  Laterality: Left;  sleep apnea requests early   COLONOSCOPY     last 2012 - [facility]   ESOPHAGOGASTRODUODENOSCOPY     Multiple, last 01/07/2017 with Savary dilation to 18 mm   EYE SURGERY  August 2022   Cataracs removed   HEMORRHOID BANDING  2018   [facility]   INTRAMEDULLARY (IM) NAIL INTERTROCHANTERIC Right 09/06/2023   Procedure: INTRAMEDULLARY (IM) NAIL INTERTROCHANTERIC;  Surgeon: Myrene Galas, MD;  Location: [facility];  Service: Orthopedics;  Laterality: Right;   LUMBAR DISC SURGERY  1984   L5    Social History   Tobacco Use   Smoking status: Never    Passive exposure: Never   Smokeless tobacco: Never  Vaping Use   Vaping status: Never Used  Substance Use Topics   Alcohol use: Yes    Comment: Average 1 glass of wine/week   Drug use: Yes    Types: Marijuana    Comment: CBD drops   Family History  Problem Relation Age of Onset   Diabetes Paternal Aunt    Breast cancer Paternal Aunt    Hypertension Maternal Grandmother    Hypertension Maternal Grandfather    Hypertension Sister    Allergies  Allergen Reactions   Aspirin Other (See Comments)    GI intolerance    Nsaids     GI discomfort and gastritis   Tolmetin Other  (See Comments)    GI discomfort and gastritis   Current Outpatient Medications on File Prior to Visit  Medication Sig Dispense Refill   acetaminophen (TYLENOL) 500 MG tablet Take 500 mg by mouth every 6 (six) hours as needed for moderate pain (pain score 4-6).     amLODipine (NORVASC) 5 MG tablet Take 1/2 tablet (2.5 mg) by mouth daily 45 tablet 3   B Complex-C (SUPER B COMPLEX PO) Take 1 tablet by mouth daily.     butalbital-acetaminophen-caffeine (FIORICET) 50-325-40 MG tablet Take  1 tablet by mouth every 6 (six) hours as needed. 60 tablet 0   cholecalciferol (VITAMIN D) 25 MCG (1000 UNIT) tablet Take 1,000 Units by mouth daily.     diclofenac sodium (VOLTAREN) 1 % GEL APPLY 2 GRAMS TOPICALLY FOUR TIMES A DAY TO AFFECTED AREAS (Patient taking differently: Apply 2 g topically 2 (two) times daily.) 300 g 3   estradiol (ESTRACE) 0.1 MG/GM vaginal cream INSERT SMALL AMOUNT (1 CM) VAGINALLY EVERY OTHER DAY 42.5 g 1   famotidine (PEPCID) 40 MG tablet Take 40 mg by mouth daily as needed for heartburn or indigestion.     levothyroxine (SYNTHROID) 75 MCG tablet Take 1 tablet (75 mcg total) by mouth daily. 90 tablet 3   linaclotide (LINZESS) 145 MCG CAPS capsule Take 1 capsule (145 mcg total) by mouth as needed. 90 capsule 3   LORazepam (ATIVAN) 0.5 MG tablet Take 1 tablet (0.5 mg total) by mouth 2 (two) times daily as needed for anxiety. 30 tablet 0   metoprolol tartrate (LOPRESSOR) 25 MG tablet Take 0.5 tablets (12.5 mg total) by mouth 2 (two) times daily. 90 tablet 1   mometasone (NASONEX) 50 MCG/ACT nasal spray Place 2 sprays into the nose daily as needed (Rhinitis).     omeprazole (PRILOSEC) 40 MG capsule Take 1 capsule (40 mg total) by mouth daily. 90 capsule 3   sodium chloride 1 g tablet Take 1 tablet (1 g total) by mouth 2 (two) times daily with a meal. 60 tablet 0   sucralfate (CARAFATE) 1 g tablet Take 1 g by mouth 4 (four) times daily as needed (Esophagitis).     No current  facility-administered medications on file prior to visit.    Review of Systems  Constitutional:  Negative for activity change, appetite change, fatigue, fever and unexpected weight change.  HENT:  Negative for congestion, ear pain, rhinorrhea, sinus pressure and sore throat.   Eyes:  Negative for pain, redness and visual disturbance.  Respiratory:  Negative for cough, shortness of breath and wheezing.   Cardiovascular:  Negative for chest pain and palpitations.  Gastrointestinal:  Negative for abdominal pain, blood in stool, constipation and diarrhea.  Endocrine: Negative for polydipsia and polyuria.  Genitourinary:  Negative for dysuria, frequency and urgency.  Musculoskeletal:  Positive for arthralgias and back pain. Negative for myalgias.  Skin:  Negative for pallor and rash.       Area of prior skin tear on RLL is still bothersome   Allergic/Immunologic: Negative for environmental allergies.  Neurological:  Negative for dizziness, syncope and headaches.  Hematological:  Negative for adenopathy. Does not bruise/bleed easily.  Psychiatric/Behavioral:  Negative for decreased concentration and dysphoric mood. The patient is not nervous/anxious.        Objective:   Physical Exam Constitutional:      General: She is not in acute distress.    Appearance: Normal appearance. She is well-developed and normal weight. She is not ill-appearing or diaphoretic.  HENT:     Head: Normocephalic and atraumatic.     Mouth/Throat:     Mouth: Mucous membranes are moist.  Eyes:     Conjunctiva/sclera: Conjunctivae normal.     Pupils: Pupils are equal, round, and reactive to light.  Neck:     Thyroid: No thyromegaly.     Vascular: No carotid bruit or JVD.  Cardiovascular:     Rate and Rhythm: Normal rate and regular rhythm.     Heart sounds: Normal heart sounds.     No  gallop.     Comments: Some LE varicosities  Pulmonary:     Effort: Pulmonary effort is normal. No respiratory distress.      Breath sounds: Normal breath sounds. No wheezing or rales.  Abdominal:     General: There is no distension or abdominal bruit.     Palpations: Abdomen is soft.  Musculoskeletal:     Cervical back: Normal range of motion and neck supple.     Right lower leg: No edema.     Left lower leg: No edema.     Comments: Limited rom of RLE and LS  Lymphadenopathy:     Cervical: No cervical adenopathy.  Skin:    General: Skin is warm and dry.     Coloration: Skin is not pale.     Findings: No rash.     Comments: In area of previous skin tear on RLL- a hyperkeratotic area (brown) with swelling -approx 1-2 cm   Mildly tender  Some mild ankle edema No drainage    Neurological:     Mental Status: She is alert.     Sensory: No sensory deficit.     Coordination: Coordination normal.     Deep Tendon Reflexes: Reflexes are normal and symmetric. Reflexes normal.  Psychiatric:        Mood and Affect: Mood normal.           Assessment & Plan:   Problem List Items Addressed This Visit       Cardiovascular and Mediastinum   Hypertension, essential    bp in fair control at this time  BP Readings from Last 1 Encounters:  10/18/23 134/70   No changes needed Most recent labs reviewed  Disc lifstyle change with low sodium diet and exercise  Continues  Amlodipine 2.5 mg daily  Metoprolol 12.5 mg bid   Some mild pedal edema Lab today       Relevant Orders   Basic metabolic panel     Musculoskeletal and Integument   Skin tear of right lower leg without complication   This healed but there is hyperkeratotic area that persists Ref to dermatology  No signs of infection       Skin lesion of right leg   Today area of previous cellulitis has scab/keratotic area  This may Harper a varicose vein  Cannot get to heal  ? If this hyperkeratotic area may be from something else or solar change  Ref to dermatology  Encouraged to keep clean with soap and water  Aquaphor prn  Try not to  traumatize area       Relevant Orders   Ambulatory referral to Dermatology   Closed right hip fracture (HCC) - Primary   Hospitalized with surgery and then SNF stay Reviewed hospital records, lab results and studies in detail  Overall doing better   Encouraged continued PT  Lab today        Other   Hyponatremia   Lab today  Baseline Was lower on hosp with hip fracture       Relevant Orders   Basic metabolic panel   Blood loss anemia   Had hip fracture surgery in jan Cbc today  Gradually getting energy level back       Relevant Orders   CBC with Differential/Platelet

## 2023-10-18 NOTE — Assessment & Plan Note (Signed)
 Lab today  Baseline Was lower on hosp with hip fracture

## 2023-10-18 NOTE — Assessment & Plan Note (Signed)
 Hospitalized with surgery and then SNF stay Reviewed hospital records, lab results and studies in detail  Overall doing better   Encouraged continued PT  Lab today

## 2023-10-18 NOTE — Assessment & Plan Note (Signed)
 Had hip fracture surgery in jan Cbc today  Gradually getting energy level back

## 2023-10-18 NOTE — Assessment & Plan Note (Signed)
  bp in fair control at this time  BP Readings from Last 1 Encounters:  10/18/23 134/70   No changes needed Most recent labs reviewed  Disc lifstyle change with low sodium diet and exercise  Continues  Amlodipine 2.5 mg daily  Metoprolol 12.5 mg bid   Some mild pedal edema Lab today

## 2023-10-19 NOTE — Telephone Encounter (Signed)
Pt notified form ready for pick up

## 2023-10-19 NOTE — Telephone Encounter (Signed)
 Patient husband came by and dropped off ppw. Placed in box up front.

## 2023-10-19 NOTE — Telephone Encounter (Signed)
Done, in IN box 

## 2023-10-19 NOTE — Telephone Encounter (Signed)
 Form in your inbox

## 2023-11-01 ENCOUNTER — Telehealth: Payer: Self-pay | Admitting: *Deleted

## 2023-11-01 NOTE — Telephone Encounter (Signed)
 Aware, thanks  Let me know if I need to do anything

## 2023-11-01 NOTE — Telephone Encounter (Signed)
 Copied from CRM [id number]. Topic: General - Other >> Nov 01, 2023 11:22 AM Kathryne Eriksson wrote: Reason for CRM: [facility] >> Nov 01, 2023 11:26 AM Kathryne Eriksson wrote: Shon Hale [phone]  [facility]   Called in regards to patient, stating that both PT And OT will be discharged early effective March 18th, due to patient going to outpatient facility.

## 2023-11-05 ENCOUNTER — Ambulatory Visit: Admitting: Podiatry

## 2023-11-08 ENCOUNTER — Ambulatory Visit: Attending: Orthopedic Surgery

## 2023-11-08 DIAGNOSIS — R262 Difficulty in walking, not elsewhere classified: Secondary | ICD-10-CM | POA: Diagnosis present

## 2023-11-08 DIAGNOSIS — R2689 Other abnormalities of gait and mobility: Secondary | ICD-10-CM

## 2023-11-08 DIAGNOSIS — M6281 Muscle weakness (generalized): Secondary | ICD-10-CM

## 2023-11-08 DIAGNOSIS — M25551 Pain in right hip: Secondary | ICD-10-CM

## 2023-11-08 DIAGNOSIS — R278 Other lack of coordination: Secondary | ICD-10-CM | POA: Diagnosis present

## 2023-11-08 DIAGNOSIS — R269 Unspecified abnormalities of gait and mobility: Secondary | ICD-10-CM

## 2023-11-08 NOTE — Therapy (Signed)
 OUTPATIENT PHYSICAL THERAPY LOWER EXTREMITY EVALUATION   Patient Name: Sarah Harper MRN: [id number] DOB:[birth year], 83 y.o., female Today's Date: 11/09/2023  END OF SESSION:  PT End of Session - 11/09/23 0739     Visit Number 1    Number of Visits 24    Date for PT Re-Evaluation 01/31/24    Progress Note Due on Visit 10    PT Start Time 1015    PT Stop Time 1059    PT Time Calculation (min) 44 min    Equipment Utilized During Treatment Gait belt    Activity Tolerance Patient tolerated treatment well;Patient limited by pain    Behavior During Therapy WFL for tasks assessed/performed             Past Medical History:  Diagnosis Date   Anxiety    Blood loss anemia 09/13/2023   Cataract 1997   Colon cancer screening 02/05/2018   DDD (degenerative disc disease)    chronic back pain   ETD (eustachian tube dysfunction)    GERD (gastroesophageal reflux disease)    Hypertension 2022   IBS (irritable bowel syndrome)    Meniere's disease    Migraine    still gets visual aura from time to time   Mild cognitive impairment    Osteoporosis    Sleep apnea    CPAP   Stroke (HCC) 2022   Syncopal episodes    after work-up - possible seizures?   Thyroid disease    hypothyroid   TIA (transient ischemic attack) 11/09/2020   Past Surgical History:  Procedure Laterality Date   APPENDECTOMY     CATARACT EXTRACTION W/PHACO Right 05/21/2019   Procedure: CATARACT EXTRACTION PHACO AND INTRAOCULAR LENS PLACEMENT (IOC) RIGHT panoptix lens  00:35.0  21.7%  7.61;  Surgeon: Lockie Mola, MD;  Location: [facility];  Service: Ophthalmology;  Laterality: Right;  sleep apnea requests early   CATARACT EXTRACTION W/PHACO Left 06/11/2019   Procedure: CATARACT EXTRACTION PHACO AND INTRAOCULAR LENS PLACEMENT (IOC) LEFT PANOPTIX TORIC LENS  00:50.0  20.3%  10.21;  Surgeon: Lockie Mola, MD;  Location: [facility];  Service: Ophthalmology;  Laterality: Left;  sleep  apnea requests early   COLONOSCOPY     last 2012 - [facility]   ESOPHAGOGASTRODUODENOSCOPY     Multiple, last 01/07/2017 with Savary dilation to 18 mm   EYE SURGERY  August 2022   Cataracs removed   HEMORRHOID BANDING  2018   [facility]   INTRAMEDULLARY (IM) NAIL INTERTROCHANTERIC Right 09/06/2023   Procedure: INTRAMEDULLARY (IM) NAIL INTERTROCHANTERIC;  Surgeon: Myrene Galas, MD;  Location: [facility];  Service: Orthopedics;  Laterality: Right;   LUMBAR DISC SURGERY  1984   L5    Patient Active Problem List   Diagnosis Date Noted   Skin lesion of right leg 10/18/2023   Hyponatremia 10/18/2023   Blood loss anemia 09/13/2023   Closed right hip fracture (HCC) 09/05/2023   Abnormal urinalysis 09/02/2023   Acute cystitis 08/21/2023   Current use of proton pump inhibitor 03/13/2023   Pain in joint, lower leg 01/30/2023   Senile purpura (HCC) 12/26/2022   Skin tear of right lower leg without complication 12/26/2022   Dysuria 10/21/2021   Elevated alkaline phosphatase level 03/11/2021   Palpitations 11/10/2020   Migraine    Hypertension, essential    H/O: CVA (cerebrovascular accident) 11/08/2020   Headache 09/26/2019   Fall at home 06/02/2019   Colon cancer screening 02/05/2018   Degenerative joint disease (DJD) of lumbar spine  05/02/2017   Internal hemorrhoids 05/02/2017   Generalized osteoarthritis of hand 12/11/2016   GAD (generalized anxiety disorder) 12/11/2016   Estrogen deficiency 05/11/2016   Encounter for screening mammogram for breast cancer 05/11/2016   OSA (obstructive sleep apnea) 03/06/2016   Fatigue 01/25/2016   Left knee pain 10/18/2015   Snoring 10/18/2015   Routine general medical examination at a health care facility 02/28/2015   Encounter for Medicare annual wellness exam 02/06/2013   Hyperlipidemia 08/27/2012   Irregular heart beat 08/05/2012   Encounter for gynecological examination 12/20/2010   Osteopenia 12/17/2009   BACK PAIN, LUMBAR 07/02/2009   IRRITABLE  BOWEL SYNDROME 04/02/2009   Asymptomatic postmenopausal status 12/10/2008   Hypothyroidism 12/05/2007   Meniere's disease 12/05/2007   GERD 12/05/2007    PCP: Dr. Roxy Manns  REFERRING PROVIDER: Dr. Myrene Galas  REFERRING DIAG: Right intertrochanteric fracture; Piriformis syndrome  THERAPY DIAG:  Abnormality of gait and mobility  Difficulty in walking, not elsewhere classified  Muscle weakness (generalized)  Other abnormalities of gait and mobility  Other lack of coordination  Pain in right hip  Rationale for Evaluation and Treatment: Rehabilitation  ONSET DATE: 09/05/2023  SUBJECTIVE:   SUBJECTIVE STATEMENT: Im here to safey walk on my own. I have a cane as the next step. I have walked around the house some with the cane. Using walker outside for now. Most annoying thing right now is right buttock pain.   PERTINENT HISTORY: Per hospital H&P from 09/05/2023: Sarah Harper is a 83 year old female with past medical history significant for HTN, hypothyroidism, GERD, osteopenia, dyslipidemia, OSA who presented [facility] ED on 1/15 fall mechanical fall at home sustaining injury to her right hip with acute pain.  Unable to ambulate.  Workup in the ED notable for acute impacted, angulated, displaced intertrochanteric fracture proximal right femur.  Orthopedics was consulted with plan for operative management on 09/06/2023.  Patient underwent intramedullary nailing of the right hip 09/06/2023 by Dr. Carola Frost.  She tested positive for COVID on 09/08/2023 and completed isolation and was discharge to skilled nursing facility.  PAIN:  Are you having pain? Yes: NPRS scale: current 1-2/10; best 0/10 and sleep pretty; up to 8/10 at worst Pain location: R glute region Pain description: ache but sharp at times Aggravating factors: walking on unlevel surfaces Relieving factors: lying down; medication.   PRECAUTIONS: Fall  RED FLAGS: None   WEIGHT BEARING RESTRICTIONS: No  FALLS:  Has  patient fallen in last 6 months? Yes. Number of falls 1  LIVING ENVIRONMENT: Lives with: lives with their spouse Lives in: House/apartment Stairs: Yes: Internal: 2 steps; on right going up and grab bar on left. Has following equipment at home: Single point cane, Walker - 2 wheeled, Grab bars, and elevated toilet  OCCUPATION: Retired -  PLOF: Independent  PATIENT GOALS: I want to walk without my walker  NEXT MD VISIT: Soon - not exactly sure  OBJECTIVE:  Note: Objective measures were completed at Evaluation unless otherwise noted.  DIAGNOSTIC FINDINGS: CLINICAL DATA:  Fall onto right hip.   EXAM: DG HIP (WITH OR WITHOUT PELVIS) 2-3V RIGHT   COMPARISON:  None Available.   FINDINGS: There is an acute impacted intertrochanteric fracture of the proximal right femur with fracture margins extending through the greater and lesser trochanters. There is approximately 1.8 cm of lateral displacement of the distal segment with apex varus angulation. The right femoral head is seated within the acetabulum. Evaluation of the sacrum is limited due to overlying bowel-gas.  The sacroiliac joints and pubic symphysis otherwise appear anatomically aligned.   IMPRESSION: Acute impacted, angulated, and displaced intertrochanteric fracture of the proximal right femur.     Electronically Signed   By: Hart Robinsons M.D.   On: 09/05/2023 08:21  PATIENT SURVEYS:  LEFS 22/80  COGNITION: Overall cognitive status: Within functional limits for tasks assessed     SENSATION: WFL  EDEMA:  Wearing compression stocking on left    PALPATION: (+) tenderness along right posterior gluteal region  LOWER EXTREMITY ROM:  Active ROM Right eval Left eval  Hip flexion    Hip extension    Hip abduction    Hip adduction    Hip internal rotation    Hip external rotation    Knee flexion    Knee extension    Ankle dorsiflexion    Ankle plantarflexion    Ankle inversion    Ankle eversion      (Blank rows = not tested)  LOWER EXTREMITY MMT:  MMT Right eval Left eval  Hip flexion 3+ *pain 4  Hip extension 3+ 4  Hip abduction 3+ *pain 4  Hip adduction    Hip internal rotation 3+ 4  Hip external rotation 3+ 4  Knee flexion 4 4  Knee extension 4 4  Ankle dorsiflexion 4 4  Ankle plantarflexion    Ankle inversion    Ankle eversion     (Blank rows = not tested)  LOWER EXTREMITY SPECIAL TESTS:  To be assessed next visit  FUNCTIONAL TESTS:  5 times sit to stand: 13.98 sec with heavy BUE Support - unable to perform without UE support despite multiple attempts Timed up and go (TUG): 28.14 sec with RW 6 minute walk test: to be assessed visit #2 10 meter walk test: 15.22 and 15.77 sec with RW= 0.65 m/s Berg Balance Scale: To be assessed visit #2  GAIT: Distance walked: approx 100 feet Assistive device utilized: Walker - 2 wheeled Level of assistance: CGA Comments: decreased step length, Increased UE support on walker, antalgic gait                                                                                                                                TREATMENT DATE: 11/08/2023    PATIENT EDUCATION:  Education details: PT plan of care; Purpose of PT and functional outcome measures; education in hip anatomy and review of surgical details Person educated: Patient and Spouse Education method: Explanation, Demonstration, Tactile cues, and Verbal cues Education comprehension: verbalized understanding and returned demonstration  HOME EXERCISE PROGRAM: To be initiated next visit  ASSESSMENT:  CLINICAL IMPRESSION: Patient is a 83  y.o. female who was seen today for physical therapy evaluation and treatment for Right intertrochanteric hip fracture-s/p R IM Nailing on 09/06/2023. She presents today with pain limited right hip mobility and currently using a walker. She presents with RLE muscle weakness when compared to left LE and difficulty with mobility. Will  further  assess her balance and special test for Hip next session. Patient will benefit from skilled PT services to address her right hip pain, muscle weakness, and impaired mobility to restore her to her previous level of independence with improved quality of life and decreased risk of falling.   OBJECTIVE IMPAIRMENTS: Abnormal gait, decreased activity tolerance, decreased balance, decreased coordination, decreased endurance, decreased mobility, difficulty walking, decreased strength, and pain.   ACTIVITY LIMITATIONS: carrying, lifting, bending, standing, squatting, stairs, and transfers  PARTICIPATION LIMITATIONS: meal prep, cleaning, laundry, shopping, community activity, and yard work  PERSONAL FACTORS: Age and 1-2 comorbidities: anxiety, Osteoporosis  are also affecting patient's functional outcome.   REHAB POTENTIAL: Good  CLINICAL DECISION MAKING: Evolving/moderate complexity  EVALUATION COMPLEXITY: Moderate   GOALS: Goals reviewed with patient? Yes  SHORT TERM GOALS: Target date: 12/20/2023 Pt will be independent with HEP in order to improve strength and balance in order to decrease fall risk and improve function at home and work.  Baseline: EVAL: No formal HEP in place Goal status: INITIAL   LONG TERM GOALS: Target date: 02/01/2024  Pt will increase LEFS by at least 9 points in order to demonstrate significant improvement in lower extremity function. Baseline: EVAL: 22/80 Goal status: INITIAL  2.  Pt will decrease worst pain as reported on NPRS by at least 3 points in order to demonstrate clinically significant reduction in ankle/foot pain.  Baseline: EVAL= 8/10 right hip pain at worst Goal status: INITIAL  3.  Pt will decrease 5TSTS by at least 3 seconds in order to demonstrate clinically significant improvement in LE strength. Baseline: EVAL 13.98 sec with heavy BUE Support - unable to perform without UE support despite multiple attempts  Goal status: INITIAL  4.  Pt will  decrease TUG to below 14 seconds/decrease in order to demonstrate decreased fall risk. Baseline: EVAL=28.14 sec with RW Goal status: INITIAL  5.  Patient will improve gait speed by 0.2 m/s for improved functional mobility and decreased risk of falling. Baseline: EVAL=0.65 m/s with RW Goal status: INITIAL  6.  Pt will improve BERG by at least 3 points in order to demonstrate clinically significant improvement in balance.  Baseline: EVAL- To be assessed visit #2 Goal status: INITIAL   PLAN:  PT FREQUENCY: 1-2x/week  PT DURATION: 12 weeks  PLANNED INTERVENTIONS: [id number]- PT Re-evaluation, 97110-Therapeutic exercises, 97530- Therapeutic activity, [id number]- Neuromuscular re-education, 97535- Self Care, [id number]- Manual therapy, [id number]- Gait training, [phone]- Orthotic Fit/training, [phone]- Canalith repositioning, [phone]- Electrical stimulation (manual), Patient/Family education, Balance training, Stair training, Taping, Dry Needling, Joint mobilization, Spinal mobilization, Scar mobilization, Compression bandaging, Vestibular training, DME instructions, Cryotherapy, and Moist heat  PLAN FOR NEXT SESSION: Further special test for Hip; BERG, 6 min walk test, Initiated strengthening and balance activities. Mobility with education in gait training and use of appropriate AD. Initiated HEP.    Lenda Kelp, PT 11/09/2023, 11:41 AM

## 2023-11-09 ENCOUNTER — Encounter: Payer: Self-pay | Admitting: Family Medicine

## 2023-11-09 MED ORDER — OMEPRAZOLE 40 MG PO CPDR: 40.0000 mg | DELAYED_RELEASE_CAPSULE | Freq: Every day | ORAL | 0 refills | Status: DC

## 2023-11-09 MED ORDER — LEVOTHYROXINE SODIUM 75 MCG PO TABS: 75.0000 ug | ORAL_TABLET | Freq: Every day | ORAL | 0 refills | Status: DC

## 2023-11-12 ENCOUNTER — Ambulatory Visit: Admitting: Podiatry

## 2023-11-12 ENCOUNTER — Encounter: Payer: Self-pay | Admitting: Podiatry

## 2023-11-12 VITALS — Ht 64.0 in | Wt 127.1 lb

## 2023-11-12 DIAGNOSIS — B351 Tinea unguium: Secondary | ICD-10-CM | POA: Diagnosis not present

## 2023-11-12 DIAGNOSIS — M79674 Pain in right toe(s): Secondary | ICD-10-CM | POA: Diagnosis not present

## 2023-11-12 DIAGNOSIS — M79675 Pain in left toe(s): Secondary | ICD-10-CM

## 2023-11-13 ENCOUNTER — Ambulatory Visit

## 2023-11-13 DIAGNOSIS — R269 Unspecified abnormalities of gait and mobility: Secondary | ICD-10-CM

## 2023-11-13 DIAGNOSIS — M6281 Muscle weakness (generalized): Secondary | ICD-10-CM

## 2023-11-13 DIAGNOSIS — R2689 Other abnormalities of gait and mobility: Secondary | ICD-10-CM

## 2023-11-13 DIAGNOSIS — M25551 Pain in right hip: Secondary | ICD-10-CM

## 2023-11-13 DIAGNOSIS — R278 Other lack of coordination: Secondary | ICD-10-CM

## 2023-11-13 DIAGNOSIS — R262 Difficulty in walking, not elsewhere classified: Secondary | ICD-10-CM

## 2023-11-13 NOTE — Therapy (Signed)
 OUTPATIENT PHYSICAL THERAPY LOWER EXTREMITY TREATMTENT   Patient Name: Sarah Harper MRN: [id number] DOB:10-May-1941, 83 y.o., female Today's Date: 11/13/2023  END OF SESSION:  PT End of Session - 11/13/23 1021     Visit Number 2    Number of Visits 24    Date for PT Re-Evaluation 01/31/24    Progress Note Due on Visit 10    PT Start Time 1016    PT Stop Time 1059    PT Time Calculation (min) 43 min    Equipment Utilized During Treatment Gait belt    Activity Tolerance Patient tolerated treatment well;Patient limited by pain    Behavior During Therapy WFL for tasks assessed/performed              Past Medical History:  Diagnosis Date   Anxiety    Blood loss anemia 09/13/2023   Cataract 1997   Colon cancer screening 02/05/2018   DDD (degenerative disc disease)    chronic back pain   ETD (eustachian tube dysfunction)    GERD (gastroesophageal reflux disease)    Hypertension 2022   IBS (irritable bowel syndrome)    Meniere's disease    Migraine    still gets visual aura from time to time   Mild cognitive impairment    Osteoporosis    Sleep apnea    CPAP   Stroke (HCC) 2022   Syncopal episodes    after work-up - possible seizures?   Thyroid disease    hypothyroid   TIA (transient ischemic attack) 11/09/2020   Past Surgical History:  Procedure Laterality Date   APPENDECTOMY     CATARACT EXTRACTION W/PHACO Right 05/21/2019   Procedure: CATARACT EXTRACTION PHACO AND INTRAOCULAR LENS PLACEMENT (IOC) RIGHT panoptix lens  00:35.0  21.7%  7.61;  Surgeon: Lockie Mola, MD;  Location: [facility];  Service: Ophthalmology;  Laterality: Right;  sleep apnea requests early   CATARACT EXTRACTION W/PHACO Left 06/11/2019   Procedure: CATARACT EXTRACTION PHACO AND INTRAOCULAR LENS PLACEMENT (IOC) LEFT PANOPTIX TORIC LENS  00:50.0  20.3%  10.21;  Surgeon: Lockie Mola, MD;  Location: [facility];  Service: Ophthalmology;  Laterality: Left;   sleep apnea requests early   COLONOSCOPY     last 2012 - [facility]   ESOPHAGOGASTRODUODENOSCOPY     Multiple, last 01/07/2017 with Savary dilation to 18 mm   EYE SURGERY  August 2022   Cataracs removed   HEMORRHOID BANDING  2018   [facility]   INTRAMEDULLARY (IM) NAIL INTERTROCHANTERIC Right 09/06/2023   Procedure: INTRAMEDULLARY (IM) NAIL INTERTROCHANTERIC;  Surgeon: Myrene Galas, MD;  Location: [facility];  Service: Orthopedics;  Laterality: Right;   LUMBAR DISC SURGERY  1984   L5    Patient Active Problem List   Diagnosis Date Noted   Skin lesion of right leg 10/18/2023   Hyponatremia 10/18/2023   Blood loss anemia 09/13/2023   Closed right hip fracture (HCC) 09/05/2023   Abnormal urinalysis 09/02/2023   Acute cystitis 08/21/2023   Current use of proton pump inhibitor 03/13/2023   Pain in joint, lower leg 01/30/2023   Senile purpura (HCC) 12/26/2022   Skin tear of right lower leg without complication 12/26/2022   Dysuria 10/21/2021   Elevated alkaline phosphatase level 03/11/2021   Palpitations 11/10/2020   Migraine    Hypertension, essential    H/O: CVA (cerebrovascular accident) 11/08/2020   Headache 09/26/2019   Fall at home 06/02/2019   Colon cancer screening 02/05/2018   Degenerative joint disease (DJD) of lumbar  spine 05/02/2017   Internal hemorrhoids 05/02/2017   Generalized osteoarthritis of hand 12/11/2016   GAD (generalized anxiety disorder) 12/11/2016   Estrogen deficiency 05/11/2016   Encounter for screening mammogram for breast cancer 05/11/2016   OSA (obstructive sleep apnea) 03/06/2016   Fatigue 01/25/2016   Left knee pain 10/18/2015   Snoring 10/18/2015   Routine general medical examination at a health care facility 02/28/2015   Encounter for Medicare annual wellness exam 02/06/2013   Hyperlipidemia 08/27/2012   Irregular heart beat 08/05/2012   Encounter for gynecological examination 12/20/2010   Osteopenia 12/17/2009   BACK PAIN, LUMBAR 07/02/2009    IRRITABLE BOWEL SYNDROME 04/02/2009   Asymptomatic postmenopausal status 12/10/2008   Hypothyroidism 12/05/2007   Meniere's disease 12/05/2007   GERD 12/05/2007    PCP: Dr. Roxy Manns  REFERRING PROVIDER: Dr. Myrene Galas  REFERRING DIAG: Right intertrochanteric fracture; Piriformis syndrome  THERAPY DIAG:  Abnormality of gait and mobility  Difficulty in walking, not elsewhere classified  Muscle weakness (generalized)  Other abnormalities of gait and mobility  Other lack of coordination  Pain in right hip  Rationale for Evaluation and Treatment: Rehabilitation  ONSET DATE: 09/05/2023  SUBJECTIVE:   SUBJECTIVE STATEMENT:  Patient reports ongoing right gluteal pain- worse with transfers and initial steps.   PERTINENT HISTORY: Per hospital H&P from 09/05/2023: Sarah Harper is a 83 year old female with past medical history significant for HTN, hypothyroidism, GERD, osteopenia, dyslipidemia, OSA who presented to [facility] ED on 1/15 fall mechanical fall at home sustaining injury to her right hip with acute pain.  Unable to ambulate.  Workup in the ED notable for acute impacted, angulated, displaced intertrochanteric fracture proximal right femur.  Orthopedics was consulted with plan for operative management on 09/06/2023.  Patient underwent intramedullary nailing of the right hip 09/06/2023 by Dr. Carola Frost.  She tested positive for COVID on 09/08/2023 and completed isolation and was discharge to skilled nursing facility.  PAIN:  Are you having pain? Yes: NPRS scale: current 1-2/10; best 0/10 and sleep pretty; up to 8/10 at worst Pain location: R glute region Pain description: ache but sharp at times Aggravating factors: walking on unlevel surfaces Relieving factors: lying down; medication.   PRECAUTIONS: Fall  RED FLAGS: None   WEIGHT BEARING RESTRICTIONS: No  FALLS:  Has patient fallen in last 6 months? Yes. Number of falls 1  LIVING ENVIRONMENT: Lives with: lives with their  spouse Lives in: House/apartment Stairs: Yes: Internal: 2 steps; on right going up and grab bar on left. Has following equipment at home: Single point cane, Walker - 2 wheeled, Grab bars, and elevated toilet  OCCUPATION: Retired -  PLOF: Independent  PATIENT GOALS: I want to walk without my walker  NEXT MD VISIT: Soon - not exactly sure  OBJECTIVE:  Note: Objective measures were completed at Evaluation unless otherwise noted.  DIAGNOSTIC FINDINGS: CLINICAL DATA:  Fall onto right hip.   EXAM: DG HIP (WITH OR WITHOUT PELVIS) 2-3V RIGHT   COMPARISON:  None Available.   FINDINGS: There is an acute impacted intertrochanteric fracture of the proximal right femur with fracture margins extending through the greater and lesser trochanters. There is approximately 1.8 cm of lateral displacement of the distal segment with apex varus angulation. The right femoral head is seated within the acetabulum. Evaluation of the sacrum is limited due to overlying bowel-gas. The sacroiliac joints and pubic symphysis otherwise appear anatomically aligned.   IMPRESSION: Acute impacted, angulated, and displaced intertrochanteric fracture of the proximal right femur.  Electronically Signed   By: Hart Robinsons M.D.   On: 09/05/2023 08:21  PATIENT SURVEYS:  LEFS 22/80  COGNITION: Overall cognitive status: Within functional limits for tasks assessed     SENSATION: WFL  EDEMA:  Wearing compression stocking on left    PALPATION: (+) tenderness along right posterior gluteal region  LOWER EXTREMITY ROM:  Active ROM Right eval Left eval  Hip flexion    Hip extension    Hip abduction    Hip adduction    Hip internal rotation    Hip external rotation    Knee flexion    Knee extension    Ankle dorsiflexion    Ankle plantarflexion    Ankle inversion    Ankle eversion     (Blank rows = not tested)  LOWER EXTREMITY MMT:  MMT Right eval Left eval  Hip flexion 3+ *pain 4   Hip extension 3+ 4  Hip abduction 3+ *pain 4  Hip adduction    Hip internal rotation 3+ 4  Hip external rotation 3+ 4  Knee flexion 4 4  Knee extension 4 4  Ankle dorsiflexion 4 4  Ankle plantarflexion    Ankle inversion    Ankle eversion     (Blank rows = not tested)  LOWER EXTREMITY SPECIAL TESTS:  To be assessed next visit  FUNCTIONAL TESTS:  5 times sit to stand: 13.98 sec with heavy BUE Support - unable to perform without UE support despite multiple attempts Timed up and go (TUG): 28.14 sec with RW 6 minute walk test: to be assessed visit #2 10 meter walk test: 15.22 and 15.77 sec with RW= 0.65 m/s Berg Balance Scale: To be assessed visit #2  GAIT: Distance walked: approx 100 feet Assistive device utilized: Walker - 2 wheeled Level of assistance: CGA Comments: decreased step length, Increased UE support on walker, antalgic gait                                                                                                                                TREATMENT DATE: 11/13/2023   Special testing hip: (-) thomas, obers, SLR (+) pain along piriformis gluteal region and tight upon stretching in supine.   Therex:  Instruction in self stretching- Supine and seated hip piriformis and then figure 4 - hold 30 sec x 3 each position.   Performed BERG   [facility] PT Assessment - 11/13/23 1309       Standardized Balance Assessment   Standardized Balance Assessment Berg Balance Test      Berg Balance Test   Sit to Stand Able to stand without using hands and stabilize independently    Standing Unsupported Able to stand safely 2 minutes    Sitting with Back Unsupported but Feet Supported on Floor or Stool Able to sit safely and securely 2 minutes    Stand to Sit Sits safely with minimal use of hands    Transfers Able to  transfer safely, minor use of hands    Standing Unsupported with Eyes Closed Able to stand 10 seconds with supervision    Standing Unsupported with Feet  Together Able to place feet together independently and stand 1 minute safely    From Standing, Reach Forward with Outstretched Arm Can reach forward >12 cm safely (5")    From Standing Position, Pick up Object from Floor Able to pick up shoe, needs supervision    From Standing Position, Turn to Look Behind Over each Shoulder Turn sideways only but maintains balance    Turn 360 Degrees Able to turn 360 degrees safely but slowly    Standing Unsupported, Alternately Place Feet on Step/Stool Able to stand independently and safely and complete 8 steps in 20 seconds    Standing Unsupported, One Foot in Front Able to plae foot ahead of the other independently and hold 30 seconds    Standing on One Leg Tries to lift leg/unable to hold 3 seconds but remains standing independently    Total Score 45             Manual therapy:  STM using "the Stick" to right piriformis region x 6 min.    PATIENT EDUCATION:  Education details: PT plan of care; Purpose of PT and functional outcome measures; education in hip anatomy and review of surgical details Person educated: Patient and Spouse Education method: Explanation, Demonstration, Tactile cues, and Verbal cues Education comprehension: verbalized understanding and returned demonstration  HOME EXERCISE PROGRAM: Access Code: [email or web address] URL: [email or web address] Date: 11/13/2023 Prepared by: Maureen Ralphs  Exercises - Supine Piriformis Stretch with Foot on Ground  - 1 x daily - 3 sets - 30 sec hold - Seated Piriformis Stretch with Trunk Bend  - 1 x daily - 3 sets - 30 sec hold - Seated Piriformis Release With [employer]  - 1 x daily - 3 sets - 10 reps  ASSESSMENT:  CLINICAL IMPRESSION: Patient returned for 2nd visit with continued report of right posterior hip/gluteal pain. She was responsive to [facility] using massage stick with some relief and presented with piriformis tightness with negative special test for hip involvement today.  She was educated in some simple piriformis stretching to begin at home and encourage to roll hip on tennis or lacrosse ball. Updated BERG goal as she does present with fall risk and will benefit from balance training along with pain management of right hip.  Patient will benefit from skilled PT services to address her right hip pain, muscle weakness, and impaired mobility to restore her to her previous level of independence with improved quality of life and decreased risk of falling.   OBJECTIVE IMPAIRMENTS: Abnormal gait, decreased activity tolerance, decreased balance, decreased coordination, decreased endurance, decreased mobility, difficulty walking, decreased strength, and pain.   ACTIVITY LIMITATIONS: carrying, lifting, bending, standing, squatting, stairs, and transfers  PARTICIPATION LIMITATIONS: meal prep, cleaning, laundry, shopping, community activity, and yard work  PERSONAL FACTORS: Age and 1-2 comorbidities: anxiety, Osteoporosis  are also affecting patient's functional outcome.   REHAB POTENTIAL: Good  CLINICAL DECISION MAKING: Evolving/moderate complexity  EVALUATION COMPLEXITY: Moderate   GOALS: Goals reviewed with patient? Yes  SHORT TERM GOALS: Target date: 12/20/2023 Pt will be independent with HEP in order to improve strength and balance in order to decrease fall risk and improve function at home and work.  Baseline: EVAL: No formal HEP in place Goal status: INITIAL   LONG TERM GOALS: Target date: 02/01/2024  Pt will increase LEFS by at  least 9 points in order to demonstrate significant improvement in lower extremity function. Baseline: EVAL: 22/80 Goal status: INITIAL  2.  Pt will decrease worst pain as reported on NPRS by at least 3 points in order to demonstrate clinically significant reduction in ankle/foot pain.  Baseline: EVAL= 8/10 right hip pain at worst Goal status: INITIAL  3.  Pt will decrease 5TSTS by at least 3 seconds in order to demonstrate  clinically significant improvement in LE strength. Baseline: EVAL 13.98 sec with heavy BUE Support - unable to perform without UE support despite multiple attempts  Goal status: INITIAL  4.  Pt will decrease TUG to below 14 seconds/decrease in order to demonstrate decreased fall risk. Baseline: EVAL=28.14 sec with RW Goal status: INITIAL  5.  Patient will improve gait speed by 0.2 m/s for improved functional mobility and decreased risk of falling. Baseline: EVAL=0.65 m/s with RW Goal status: INITIAL  6.  Pt will improve BERG by at least 3 points in order to demonstrate clinically significant improvement in balance.  Baseline: EVAL- To be assessed visit #2; 11/13/2023= 45/56 Goal status: INITIAL   PLAN:  PT FREQUENCY: 1-2x/week  PT DURATION: 12 weeks  PLANNED INTERVENTIONS: 97164- PT Re-evaluation, 97110-Therapeutic exercises, 97530- Therapeutic activity, [id number]- Neuromuscular re-education, 97535- Self Care, 16109- Manual therapy, [id number]- Gait training, [phone]- Orthotic Fit/training, [phone]- Canalith repositioning, [phone]- Electrical stimulation (manual), Patient/Family education, Balance training, Stair training, Taping, Dry Needling, Joint mobilization, Spinal mobilization, Scar mobilization, Compression bandaging, Vestibular training, DME instructions, Cryotherapy, and Moist heat  PLAN FOR NEXT SESSION:  6 min walk test, progress  strengthening and balance activities. Mobility with education in gait training and use of appropriate AD. Progress HEP.    Lenda Kelp, PT 11/13/2023, 1:12 PM

## 2023-11-16 ENCOUNTER — Ambulatory Visit: Admitting: Physical Therapy

## 2023-11-16 NOTE — Progress Notes (Signed)
 Subjective: Sarah Harper presents today referred by Judy Pimple, MD for complaint of painful elongated mycotic toenails 1-5 bilaterally which are tender when wearing enclosed shoe gear. Pain is relieved with periodic professional debridement.. She is accompanied by her husband on today's visit. Chief Complaint  Patient presents with   Nail Problem    Pt is here for [facility] PCP is Dr Milinda Antis and LOV was in February.   Past Medical History:  Diagnosis Date   Anxiety    Blood loss anemia 09/13/2023   Cataract 1997   Colon cancer screening 02/05/2018   DDD (degenerative disc disease)    chronic back pain   ETD (eustachian tube dysfunction)    GERD (gastroesophageal reflux disease)    Hypertension 2022   IBS (irritable bowel syndrome)    Meniere's disease    Migraine    still gets visual aura from time to time   Mild cognitive impairment    Osteoporosis    Sleep apnea    CPAP   Stroke (HCC) 2022   Syncopal episodes    after work-up - possible seizures?   Thyroid disease    hypothyroid   TIA (transient ischemic attack) 11/09/2020     Patient Active Problem List   Diagnosis Date Noted   Skin lesion of right leg 10/18/2023   Hyponatremia 10/18/2023   Blood loss anemia 09/13/2023   Closed right hip fracture (HCC) 09/05/2023   Abnormal urinalysis 09/02/2023   Acute cystitis 08/21/2023   Current use of proton pump inhibitor 03/13/2023   Pain in joint, lower leg 01/30/2023   Senile purpura (HCC) 12/26/2022   Skin tear of right lower leg without complication 12/26/2022   Dysuria 10/21/2021   Elevated alkaline phosphatase level 03/11/2021   Palpitations 11/10/2020   Migraine    Hypertension, essential    H/O: CVA (cerebrovascular accident) 11/08/2020   Headache 09/26/2019   Fall at home 06/02/2019   Colon cancer screening 02/05/2018   Degenerative joint disease (DJD) of lumbar spine 05/02/2017   Internal hemorrhoids 05/02/2017   Generalized osteoarthritis of hand  12/11/2016   GAD (generalized anxiety disorder) 12/11/2016   Estrogen deficiency 05/11/2016   Encounter for screening mammogram for breast cancer 05/11/2016   OSA (obstructive sleep apnea) 03/06/2016   Fatigue 01/25/2016   Left knee pain 10/18/2015   Snoring 10/18/2015   Routine general medical examination at a health care facility 02/28/2015   Encounter for Medicare annual wellness exam 02/06/2013   Hyperlipidemia 08/27/2012   Irregular heart beat 08/05/2012   Encounter for gynecological examination 12/20/2010   Osteopenia 12/17/2009   BACK PAIN, LUMBAR 07/02/2009   IRRITABLE BOWEL SYNDROME 04/02/2009   Asymptomatic postmenopausal status 12/10/2008   Hypothyroidism 12/05/2007   Meniere's disease 12/05/2007   GERD 12/05/2007     Past Surgical History:  Procedure Laterality Date   APPENDECTOMY     CATARACT EXTRACTION W/PHACO Right 05/21/2019   Procedure: CATARACT EXTRACTION PHACO AND INTRAOCULAR LENS PLACEMENT (IOC) RIGHT panoptix lens  00:35.0  21.7%  7.61;  Surgeon: Lockie Mola, MD;  Location: [facility];  Service: Ophthalmology;  Laterality: Right;  sleep apnea requests early   CATARACT EXTRACTION W/PHACO Left 06/11/2019   Procedure: CATARACT EXTRACTION PHACO AND INTRAOCULAR LENS PLACEMENT (IOC) LEFT PANOPTIX TORIC LENS  00:50.0  20.3%  10.21;  Surgeon: Lockie Mola, MD;  Location: [facility];  Service: Ophthalmology;  Laterality: Left;  sleep apnea requests early   COLONOSCOPY     last 2012 - [facility]  ESOPHAGOGASTRODUODENOSCOPY     Multiple, last 01/07/2017 with Savary dilation to 18 mm   EYE SURGERY  August 2022   Cataracs removed   HEMORRHOID BANDING  2018   [facility]   INTRAMEDULLARY (IM) NAIL INTERTROCHANTERIC Right 09/06/2023   Procedure: INTRAMEDULLARY (IM) NAIL INTERTROCHANTERIC;  Surgeon: Myrene Galas, MD;  Location: [facility];  Service: Orthopedics;  Laterality: Right;   LUMBAR DISC SURGERY  1984   L5      Current Outpatient  Medications on File Prior to Visit  Medication Sig Dispense Refill   acetaminophen (TYLENOL) 500 MG tablet Take 500 mg by mouth every 6 (six) hours as needed for moderate pain (pain score 4-6).     amLODipine (NORVASC) 5 MG tablet Take 1/2 tablet (2.5 mg) by mouth daily 45 tablet 3   B Complex-C (SUPER B COMPLEX PO) Take 1 tablet by mouth daily.     butalbital-acetaminophen-caffeine (FIORICET) 50-325-40 MG tablet Take 1 tablet by mouth every 6 (six) hours as needed. 60 tablet 0   cholecalciferol (VITAMIN D) 25 MCG (1000 UNIT) tablet Take 1,000 Units by mouth daily.     diclofenac sodium (VOLTAREN) 1 % GEL APPLY 2 GRAMS TOPICALLY FOUR TIMES A DAY TO AFFECTED AREAS (Patient taking differently: Apply 2 g topically 2 (two) times daily.) 300 g 3   estradiol (ESTRACE) 0.1 MG/GM vaginal cream INSERT SMALL AMOUNT (1 CM) VAGINALLY EVERY OTHER DAY 42.5 g 1   famotidine (PEPCID) 40 MG tablet Take 40 mg by mouth daily as needed for heartburn or indigestion.     levothyroxine (SYNTHROID) 75 MCG tablet Take 1 tablet (75 mcg total) by mouth daily before breakfast. 90 tablet 0   linaclotide (LINZESS) 145 MCG CAPS capsule Take 1 capsule (145 mcg total) by mouth as needed. 90 capsule 3   LORazepam (ATIVAN) 0.5 MG tablet Take 1 tablet (0.5 mg total) by mouth 2 (two) times daily as needed for anxiety. 30 tablet 0   metoprolol tartrate (LOPRESSOR) 25 MG tablet Take 0.5 tablets (12.5 mg total) by mouth 2 (two) times daily. 90 tablet 1   mometasone (NASONEX) 50 MCG/ACT nasal spray Place 2 sprays into the nose daily as needed (Rhinitis).     omeprazole (PRILOSEC) 40 MG capsule Take 1 capsule (40 mg total) by mouth daily. 90 capsule 0   sodium chloride 1 g tablet Take 1 tablet (1 g total) by mouth 2 (two) times daily with a meal. 60 tablet 0   sucralfate (CARAFATE) 1 g tablet Take 1 g by mouth 4 (four) times daily as needed (Esophagitis).     No current facility-administered medications on file prior to visit.      Allergies  Allergen Reactions   Aspirin Other (See Comments)    GI intolerance    Nsaids     GI discomfort and gastritis   Tolmetin Other (See Comments)    GI discomfort and gastritis     Social History   Occupational History   Occupation: Retired  Tobacco Use   Smoking status: Never    Passive exposure: Never   Smokeless tobacco: Never  Vaping Use   Vaping status: Never Used  Substance and Sexual Activity   Alcohol use: Yes    Comment: Average 1 glass of wine/week   Drug use: Yes    Types: Marijuana    Comment: CBD drops   Sexual activity: Yes    Birth control/protection: Post-menopausal     Family History  Problem Relation Age of Onset  Diabetes Paternal Aunt    Breast cancer Paternal Aunt    Hypertension Maternal Grandmother    Hypertension Maternal Grandfather    Hypertension Sister      Immunization History  Administered Date(s) Administered   Fluad Quad(high Dose 65+) 05/07/2019, 05/22/2020, 05/27/2021, 05/17/2022   Fluad Trivalent(High Dose 65+) 06/20/2023   Influenza Whole 05/22/2007, 05/21/2009   Influenza,inj,Quad PF,6+ Mos 06/11/2013, 05/28/2014, 05/14/2015, 05/12/2016, 05/10/2017, 05/16/2018   PFIZER(Purple Top)SARS-COV-2 Vaccination 09/12/2019, 10/03/2019, 04/24/2020   Pfizer Covid-19 Vaccine Bivalent Booster 66yrs & up 06/17/2021   Pneumococcal Conjugate-13 02/24/2014   Pneumococcal Polysaccharide-23 12/22/2011   Td 12/19/2001, 12/22/2011   Tdap 01/30/2023   Zoster Recombinant(Shingrix) 08/20/2021, 12/14/2021   Zoster, Live 11/12/2012     Objective: There were no vitals filed for this visit.  Sarah Harper is a pleasant 83 y.o. female WD, WN in NAD. AAO x 3.  Vascular Examination: Vascular status intact b/l with palpable pedal pulses. Pedal hair present b/l. CFT immediate b/l. No edema. No pain with calf compression b/l. Skin temperature gradient WNL b/l.   Neurological Examination: Sensation grossly intact b/l with 10 gram  monofilament. Vibratory sensation intact b/l.   Dermatological Examination: Pedal skin with normal turgor, texture and tone b/l. Toenails 1-5 b/l thick, discolored, elongated with subungual debris and pain on dorsal palpation. No hyperkeratotic lesions noted b/l.   Musculoskeletal Examination: Muscle strength 5/5 to b/l LE. HAV with bunion deformity noted b/l LE.  Radiographs: None  Assessment: 1. Pain due to onychomycosis of toenails of both feet     Plan: Patient was evaluated and treated. All patient's and/or POA's questions/concerns addressed on today's visit. Toenails 1-5 debrided in length and girth without incident. Continue soft, supportive shoe gear daily. Report any pedal injuries to medical professional. Call office if there are any questions/concerns. -Patient/POA to call should there be question/concern in the interim.  Return in about 3 months (around 02/12/2024).  Freddie Breech, DPM      [facility] LOCATION: [address] [place] [address]                   Office [phone]   [facility] LOCATION: [address] [place], [place] [address] Office [phone]

## 2023-11-20 ENCOUNTER — Ambulatory Visit: Attending: Orthopedic Surgery

## 2023-11-20 DIAGNOSIS — M25551 Pain in right hip: Secondary | ICD-10-CM | POA: Diagnosis present

## 2023-11-20 DIAGNOSIS — M6281 Muscle weakness (generalized): Secondary | ICD-10-CM | POA: Diagnosis present

## 2023-11-20 DIAGNOSIS — R269 Unspecified abnormalities of gait and mobility: Secondary | ICD-10-CM | POA: Insufficient documentation

## 2023-11-20 DIAGNOSIS — R262 Difficulty in walking, not elsewhere classified: Secondary | ICD-10-CM | POA: Diagnosis present

## 2023-11-20 DIAGNOSIS — R2689 Other abnormalities of gait and mobility: Secondary | ICD-10-CM | POA: Diagnosis present

## 2023-11-20 DIAGNOSIS — R278 Other lack of coordination: Secondary | ICD-10-CM | POA: Insufficient documentation

## 2023-11-20 NOTE — Therapy (Signed)
 OUTPATIENT PHYSICAL THERAPY LOWER EXTREMITY TREATMTENT   Patient Name: Sarah Harper MRN: [id number] DOB:1940/09/06, 83 y.o., female Today's Date: 11/21/2023  END OF SESSION:  PT End of Session - 11/20/23 1028     Visit Number 3    Number of Visits 24    Date for PT Re-Evaluation 01/31/24    Progress Note Due on Visit 10    PT Start Time 1017    PT Stop Time 1100    PT Time Calculation (min) 43 min    Equipment Utilized During Treatment Gait belt    Activity Tolerance Patient tolerated treatment well    Behavior During Therapy WFL for tasks assessed/performed              Past Medical History:  Diagnosis Date   Anxiety    Blood loss anemia 09/13/2023   Cataract 1997   Colon cancer screening 02/05/2018   DDD (degenerative disc disease)    chronic back pain   ETD (eustachian tube dysfunction)    GERD (gastroesophageal reflux disease)    Hypertension 2022   IBS (irritable bowel syndrome)    Meniere's disease    Migraine    still gets visual aura from time to time   Mild cognitive impairment    Osteoporosis    Sleep apnea    CPAP   Stroke (HCC) 2022   Syncopal episodes    after work-up - possible seizures?   Thyroid disease    hypothyroid   TIA (transient ischemic attack) 11/09/2020   Past Surgical History:  Procedure Laterality Date   APPENDECTOMY     CATARACT EXTRACTION W/PHACO Right 05/21/2019   Procedure: CATARACT EXTRACTION PHACO AND INTRAOCULAR LENS PLACEMENT (IOC) RIGHT panoptix lens  00:35.0  21.7%  7.61;  Surgeon: Lockie Mola, MD;  Location: [facility];  Service: Ophthalmology;  Laterality: Right;  sleep apnea requests early   CATARACT EXTRACTION W/PHACO Left 06/11/2019   Procedure: CATARACT EXTRACTION PHACO AND INTRAOCULAR LENS PLACEMENT (IOC) LEFT PANOPTIX TORIC LENS  00:50.0  20.3%  10.21;  Surgeon: Lockie Mola, MD;  Location: [facility];  Service: Ophthalmology;  Laterality: Left;  sleep apnea requests early    COLONOSCOPY     last 2012 - [facility]   ESOPHAGOGASTRODUODENOSCOPY     Multiple, last 01/07/2017 with Savary dilation to 18 mm   EYE SURGERY  August 2022   Cataracs removed   HEMORRHOID BANDING  2018   [facility]   INTRAMEDULLARY (IM) NAIL INTERTROCHANTERIC Right 09/06/2023   Procedure: INTRAMEDULLARY (IM) NAIL INTERTROCHANTERIC;  Surgeon: Myrene Galas, MD;  Location: [facility];  Service: Orthopedics;  Laterality: Right;   LUMBAR DISC SURGERY  1984   L5    Patient Active Problem List   Diagnosis Date Noted   Skin lesion of right leg 10/18/2023   Hyponatremia 10/18/2023   Blood loss anemia 09/13/2023   Closed right hip fracture (HCC) 09/05/2023   Abnormal urinalysis 09/02/2023   Acute cystitis 08/21/2023   Current use of proton pump inhibitor 03/13/2023   Pain in joint, lower leg 01/30/2023   Senile purpura (HCC) 12/26/2022   Skin tear of right lower leg without complication 12/26/2022   Dysuria 10/21/2021   Elevated alkaline phosphatase level 03/11/2021   Palpitations 11/10/2020   Migraine    Hypertension, essential    H/O: CVA (cerebrovascular accident) 11/08/2020   Headache 09/26/2019   Fall at home 06/02/2019   Colon cancer screening 02/05/2018   Degenerative joint disease (DJD) of lumbar spine 05/02/2017  Internal hemorrhoids 05/02/2017   Generalized osteoarthritis of hand 12/11/2016   GAD (generalized anxiety disorder) 12/11/2016   Estrogen deficiency 05/11/2016   Encounter for screening mammogram for breast cancer 05/11/2016   OSA (obstructive sleep apnea) 03/06/2016   Fatigue 01/25/2016   Left knee pain 10/18/2015   Snoring 10/18/2015   Routine general medical examination at a health care facility 02/28/2015   Encounter for Medicare annual wellness exam 02/06/2013   Hyperlipidemia 08/27/2012   Irregular heart beat 08/05/2012   Encounter for gynecological examination 12/20/2010   Osteopenia 12/17/2009   BACK PAIN, LUMBAR 07/02/2009   IRRITABLE BOWEL SYNDROME  04/02/2009   Asymptomatic postmenopausal status 12/10/2008   Hypothyroidism 12/05/2007   Meniere's disease 12/05/2007   GERD 12/05/2007    PCP: Dr. Roxy Manns  REFERRING PROVIDER: Dr. Myrene Galas  REFERRING DIAG: Right intertrochanteric fracture; Piriformis syndrome  THERAPY DIAG:  Abnormality of gait and mobility  Difficulty in walking, not elsewhere classified  Muscle weakness (generalized)  Other abnormalities of gait and mobility  Other lack of coordination  Pain in right hip  Rationale for Evaluation and Treatment: Rehabilitation  ONSET DATE: 09/05/2023  SUBJECTIVE:   SUBJECTIVE STATEMENT:  Patient reports pain has improved some- states she has been compliant with current exercises.  PERTINENT HISTORY: Per hospital H&P from 09/05/2023: Sarah Harper is a 83 year old female with past medical history significant for HTN, hypothyroidism, GERD, osteopenia, dyslipidemia, OSA who presented to [facility] ED on 1/15 fall mechanical fall at home sustaining injury to her right hip with acute pain.  Unable to ambulate.  Workup in the ED notable for acute impacted, angulated, displaced intertrochanteric fracture proximal right femur.  Orthopedics was consulted with plan for operative management on 09/06/2023.  Patient underwent intramedullary nailing of the right hip 09/06/2023 by Dr. Carola Frost.  She tested positive for COVID on 09/08/2023 and completed isolation and was discharge to skilled nursing facility.  PAIN:  Are you having pain? Yes: NPRS scale: current 1-2/10; best 0/10 and sleep pretty; up to 8/10 at worst Pain location: R glute region Pain description: ache but sharp at times Aggravating factors: walking on unlevel surfaces Relieving factors: lying down; medication.   PRECAUTIONS: Fall  RED FLAGS: None   WEIGHT BEARING RESTRICTIONS: No  FALLS:  Has patient fallen in last 6 months? Yes. Number of falls 1  LIVING ENVIRONMENT: Lives with: lives with their spouse Lives  in: House/apartment Stairs: Yes: Internal: 2 steps; on right going up and grab bar on left. Has following equipment at home: Single point cane, Walker - 2 wheeled, Grab bars, and elevated toilet  OCCUPATION: Retired -  PLOF: Independent  PATIENT GOALS: I want to walk without my walker  NEXT MD VISIT: Soon - not exactly sure  OBJECTIVE:  Note: Objective measures were completed at Evaluation unless otherwise noted.  DIAGNOSTIC FINDINGS: CLINICAL DATA:  Fall onto right hip.   EXAM: DG HIP (WITH OR WITHOUT PELVIS) 2-3V RIGHT   COMPARISON:  None Available.   FINDINGS: There is an acute impacted intertrochanteric fracture of the proximal right femur with fracture margins extending through the greater and lesser trochanters. There is approximately 1.8 cm of lateral displacement of the distal segment with apex varus angulation. The right femoral head is seated within the acetabulum. Evaluation of the sacrum is limited due to overlying bowel-gas. The sacroiliac joints and pubic symphysis otherwise appear anatomically aligned.   IMPRESSION: Acute impacted, angulated, and displaced intertrochanteric fracture of the proximal right femur.  Electronically Signed   By: Hart Robinsons M.D.   On: 09/05/2023 08:21  PATIENT SURVEYS:  LEFS 22/80  COGNITION: Overall cognitive status: Within functional limits for tasks assessed     SENSATION: WFL  EDEMA:  Wearing compression stocking on left    PALPATION: (+) tenderness along right posterior gluteal region  LOWER EXTREMITY ROM:  Active ROM Right eval Left eval  Hip flexion    Hip extension    Hip abduction    Hip adduction    Hip internal rotation    Hip external rotation    Knee flexion    Knee extension    Ankle dorsiflexion    Ankle plantarflexion    Ankle inversion    Ankle eversion     (Blank rows = not tested)  LOWER EXTREMITY MMT:  MMT Right eval Left eval  Hip flexion 3+ *pain 4  Hip extension  3+ 4  Hip abduction 3+ *pain 4  Hip adduction    Hip internal rotation 3+ 4  Hip external rotation 3+ 4  Knee flexion 4 4  Knee extension 4 4  Ankle dorsiflexion 4 4  Ankle plantarflexion    Ankle inversion    Ankle eversion     (Blank rows = not tested)  LOWER EXTREMITY SPECIAL TESTS:  To be assessed next visit  FUNCTIONAL TESTS:  5 times sit to stand: 13.98 sec with heavy BUE Support - unable to perform without UE support despite multiple attempts Timed up and go (TUG): 28.14 sec with RW 6 minute walk test: to be assessed visit #2 10 meter walk test: 15.22 and 15.77 sec with RW= 0.65 m/s Berg Balance Scale: To be assessed visit #2  GAIT: Distance walked: approx 100 feet Assistive device utilized: Walker - 2 wheeled Level of assistance: CGA Comments: decreased step length, Increased UE support on walker, antalgic gait                                                                                                                                TREATMENT DATE: 11/20/2023      Self care/Home management Reviewed self- stretching-  seated hip piriformis and then figure 4 - hold 30 sec x 3 each position.  Revised ball rolling (STM) against ball in standing vs. Initial instruction of seated- patient responded well to transition stating this technique was easier to perform x 2 min  6 Min Walk Test:  Instructed patient to ambulate as quickly and as safely as possible for 6 minutes using LRAD. Patient was allowed to take standing rest breaks without stopping the test, but if the patient required a sitting rest break the clock would be stopped and the test would be over.  Results: 450 feet  in 4 min- stopped due to right gluteal soreness (137 meters, Avg speed 0.57 m/s) using a RW with SBA. Results indicate that the patient has reduced endurance with ambulation compared to age matched  norms.  Age Matched Norms: 17-69 yo M: 12 F: 66, 33-79 yo M: 62 F: 471, 7-89 yo M: 417 F: 392 MDC:  58.21 meters (190.98 feet) or 50 meters (ANPTA Core Set of Outcome Measures for Adults with Neurologic Conditions, 2018)    THEREX: Standing Hip abd x 12 reps alt LE Standing Hip ER x 12 reps alt LE Standing Hip flex x 12 reps alt LE Standing Hip ext x 12 reps alt LE      PATIENT EDUCATION:  Education details: Exercise technique Person educated: Patient and Spouse Education method: Explanation, Demonstration, Tactile cues, and Verbal cues Education comprehension: verbalized understanding and returned demonstration  HOME EXERCISE PROGRAM: Access Code: [facility] URL: [email or web address] Date: 11/20/2023 Prepared by: Maureen Ralphs  Exercises - Supine Bridge  - 3 x weekly - 3 sets - 10 reps - Clamshell with Resistance  - 3 x weekly - 3 sets - 10 reps - Standing Diagonal Hip Extension and External Rotation  - 3 x weekly - 3 sets - 10 reps - Standing Hip Abduction with Counter Support  - 3 x weekly - 3 sets - 10 reps - Standing March with Counter Support  - 3 x weekly - 3 sets - 10 reps     Access Code: [email or web address] URL: [email or web address] Date: 11/13/2023 Prepared by: Maureen Ralphs  Exercises - Supine Piriformis Stretch with Foot on Ground  - 1 x daily - 3 sets - 30 sec hold - Seated Piriformis Stretch with Trunk Bend  - 1 x daily - 3 sets - 30 sec hold - Seated Piriformis Release With [employer]  - 1 x daily - 3 sets - 10 reps  ASSESSMENT:  CLINICAL IMPRESSION: Patient presents well motivated and demonstrating good compliance with HEP to date. She was able to modify and clarify some exercises in HEP and progress to some gluteal/hip strengthening. She presents with pain limited mobility as seen in 6 min walk test and will benefit from mobility training to improve her overall stamina with walking.  Patient will benefit from skilled PT services to address her right hip pain, muscle weakness, and impaired mobility to restore her to her  previous level of independence with improved quality of life and decreased risk of falling.   OBJECTIVE IMPAIRMENTS: Abnormal gait, decreased activity tolerance, decreased balance, decreased coordination, decreased endurance, decreased mobility, difficulty walking, decreased strength, and pain.   ACTIVITY LIMITATIONS: carrying, lifting, bending, standing, squatting, stairs, and transfers  PARTICIPATION LIMITATIONS: meal prep, cleaning, laundry, shopping, community activity, and yard work  PERSONAL FACTORS: Age and 1-2 comorbidities: anxiety, Osteoporosis  are also affecting patient's functional outcome.   REHAB POTENTIAL: Good  CLINICAL DECISION MAKING: Evolving/moderate complexity  EVALUATION COMPLEXITY: Moderate   GOALS: Goals reviewed with patient? Yes  SHORT TERM GOALS: Target date: 12/20/2023 Pt will be independent with HEP in order to improve strength and balance in order to decrease fall risk and improve function at home and work.  Baseline: EVAL: No formal HEP in place Goal status: INITIAL   LONG TERM GOALS: Target date: 02/01/2024  Pt will increase LEFS by at least 9 points in order to demonstrate significant improvement in lower extremity function. Baseline: EVAL: 22/80 Goal status: INITIAL  2.  Pt will decrease worst pain as reported on NPRS by at least 3 points in order to demonstrate clinically significant reduction in ankle/foot pain.  Baseline: EVAL= 8/10 right hip pain at worst Goal status: INITIAL  3.  Pt will decrease 5TSTS by at least  3 seconds in order to demonstrate clinically significant improvement in LE strength. Baseline: EVAL 13.98 sec with heavy BUE Support - unable to perform without UE support despite multiple attempts  Goal status: INITIAL  4.  Pt will decrease TUG to below 14 seconds/decrease in order to demonstrate decreased fall risk. Baseline: EVAL=28.14 sec with RW Goal status: INITIAL  5.  Patient will improve gait speed by 0.2 m/s for  improved functional mobility and decreased risk of falling. Baseline: EVAL=0.65 m/s with RW Goal status: INITIAL  6.  Pt will improve BERG by at least 3 points in order to demonstrate clinically significant improvement in balance.  Baseline: EVAL- To be assessed visit #2; 11/13/2023= 45/56 Goal status: INITIAL  7. Pt will increase (able to complete the test without stopping) and > 800 feet in order to demonstrate clinically significant improvement in cardiopulmonary endurance and community ambulation   Baseline: 11/20/2023= 450 feet in 4 min (Stopped due to pain  Goal status: NEW   PLAN:  PT FREQUENCY: 1-2x/week  PT DURATION: 12 weeks  PLANNED INTERVENTIONS: 97164- PT Re-evaluation, 97110-Therapeutic exercises, 97530- Therapeutic activity, 97112- Neuromuscular re-education, 97535- Self Care, 16109- Manual therapy, [id number]- Gait training, [phone]- Orthotic Fit/training, [phone]- Canalith repositioning, [phone]- Electrical stimulation (manual), Patient/Family education, Balance training, Stair training, Taping, Dry Needling, Joint mobilization, Spinal mobilization, Scar mobilization, Compression bandaging, Vestibular training, DME instructions, Cryotherapy, and Moist heat  PLAN FOR NEXT SESSION:  progress  strengthening and balance activities. Mobility with education in gait training and use of appropriate AD. Progress HEP.    Lenda Kelp, PT 11/21/2023, 8:08 AM

## 2023-11-22 ENCOUNTER — Ambulatory Visit

## 2023-11-22 DIAGNOSIS — R269 Unspecified abnormalities of gait and mobility: Secondary | ICD-10-CM | POA: Diagnosis not present

## 2023-11-22 DIAGNOSIS — R2689 Other abnormalities of gait and mobility: Secondary | ICD-10-CM

## 2023-11-22 DIAGNOSIS — R278 Other lack of coordination: Secondary | ICD-10-CM

## 2023-11-22 DIAGNOSIS — M6281 Muscle weakness (generalized): Secondary | ICD-10-CM

## 2023-11-22 DIAGNOSIS — R262 Difficulty in walking, not elsewhere classified: Secondary | ICD-10-CM

## 2023-11-22 DIAGNOSIS — M25551 Pain in right hip: Secondary | ICD-10-CM

## 2023-11-22 NOTE — Therapy (Signed)
 OUTPATIENT PHYSICAL THERAPY LOWER EXTREMITY TREATMTENT   Patient Name: Sarah Harper MRN: [id number] DOB:1941/01/01, 83 y.o., female Today's Date: 11/23/2023  END OF SESSION:  PT End of Session - 11/22/23 1356     Visit Number 4    Number of Visits 24    Date for PT Re-Evaluation 01/31/24    Progress Note Due on Visit 10    PT Start Time 1357    PT Stop Time 1440    PT Time Calculation (min) 43 min    Equipment Utilized During Treatment Gait belt    Activity Tolerance Patient tolerated treatment well    Behavior During Therapy WFL for tasks assessed/performed              Past Medical History:  Diagnosis Date   Anxiety    Blood loss anemia 09/13/2023   Cataract 1997   Colon cancer screening 02/05/2018   DDD (degenerative disc disease)    chronic back pain   ETD (eustachian tube dysfunction)    GERD (gastroesophageal reflux disease)    Hypertension 2022   IBS (irritable bowel syndrome)    Meniere's disease    Migraine    still gets visual aura from time to time   Mild cognitive impairment    Osteoporosis    Sleep apnea    CPAP   Stroke (HCC) 2022   Syncopal episodes    after work-up - possible seizures?   Thyroid disease    hypothyroid   TIA (transient ischemic attack) 11/09/2020   Past Surgical History:  Procedure Laterality Date   APPENDECTOMY     CATARACT EXTRACTION W/PHACO Right 05/21/2019   Procedure: CATARACT EXTRACTION PHACO AND INTRAOCULAR LENS PLACEMENT (IOC) RIGHT panoptix lens  00:35.0  21.7%  7.61;  Surgeon: Lockie Mola, MD;  Location: [facility];  Service: Ophthalmology;  Laterality: Right;  sleep apnea requests early   CATARACT EXTRACTION W/PHACO Left 06/11/2019   Procedure: CATARACT EXTRACTION PHACO AND INTRAOCULAR LENS PLACEMENT (IOC) LEFT PANOPTIX TORIC LENS  00:50.0  20.3%  10.21;  Surgeon: Lockie Mola, MD;  Location: [facility];  Service: Ophthalmology;  Laterality: Left;  sleep apnea requests early    COLONOSCOPY     last 2012 - [facility]   ESOPHAGOGASTRODUODENOSCOPY     Multiple, last 01/07/2017 with Savary dilation to 18 mm   EYE SURGERY  August 2022   Cataracs removed   HEMORRHOID BANDING  2018   [facility]   INTRAMEDULLARY (IM) NAIL INTERTROCHANTERIC Right 09/06/2023   Procedure: INTRAMEDULLARY (IM) NAIL INTERTROCHANTERIC;  Surgeon: Myrene Galas, MD;  Location: [facility];  Service: Orthopedics;  Laterality: Right;   LUMBAR DISC SURGERY  1984   L5    Patient Active Problem List   Diagnosis Date Noted   Skin lesion of right leg 10/18/2023   Hyponatremia 10/18/2023   Blood loss anemia 09/13/2023   Closed right hip fracture (HCC) 09/05/2023   Abnormal urinalysis 09/02/2023   Acute cystitis 08/21/2023   Current use of proton pump inhibitor 03/13/2023   Pain in joint, lower leg 01/30/2023   Senile purpura (HCC) 12/26/2022   Skin tear of right lower leg without complication 12/26/2022   Dysuria 10/21/2021   Elevated alkaline phosphatase level 03/11/2021   Palpitations 11/10/2020   Migraine    Hypertension, essential    H/O: CVA (cerebrovascular accident) 11/08/2020   Headache 09/26/2019   Fall at home 06/02/2019   Colon cancer screening 02/05/2018   Degenerative joint disease (DJD) of lumbar spine 05/02/2017  Internal hemorrhoids 05/02/2017   Generalized osteoarthritis of hand 12/11/2016   GAD (generalized anxiety disorder) 12/11/2016   Estrogen deficiency 05/11/2016   Encounter for screening mammogram for breast cancer 05/11/2016   OSA (obstructive sleep apnea) 03/06/2016   Fatigue 01/25/2016   Left knee pain 10/18/2015   Snoring 10/18/2015   Routine general medical examination at a health care facility 02/28/2015   Encounter for Medicare annual wellness exam 02/06/2013   Hyperlipidemia 08/27/2012   Irregular heart beat 08/05/2012   Encounter for gynecological examination 12/20/2010   Osteopenia 12/17/2009   BACK PAIN, LUMBAR 07/02/2009   IRRITABLE BOWEL SYNDROME  04/02/2009   Asymptomatic postmenopausal status 12/10/2008   Hypothyroidism 12/05/2007   Meniere's disease 12/05/2007   GERD 12/05/2007    PCP: Dr. Roxy Manns  REFERRING PROVIDER: Dr. Myrene Galas  REFERRING DIAG: Right intertrochanteric fracture; Piriformis syndrome  THERAPY DIAG:  Abnormality of gait and mobility  Difficulty in walking, not elsewhere classified  Muscle weakness (generalized)  Other abnormalities of gait and mobility  Other lack of coordination  Pain in right hip  Rationale for Evaluation and Treatment: Rehabilitation  ONSET DATE: 09/05/2023  SUBJECTIVE:   SUBJECTIVE STATEMENT:  Patient reports pain some new anteior prox thigh/groin pain.   PERTINENT HISTORY: Per hospital H&P from 09/05/2023: Sarah Harper is a 83 year old female with past medical history significant for HTN, hypothyroidism, GERD, osteopenia, dyslipidemia, OSA who presented to [facility] ED on 1/15 fall mechanical fall at home sustaining injury to her right hip with acute pain.  Unable to ambulate.  Workup in the ED notable for acute impacted, angulated, displaced intertrochanteric fracture proximal right femur.  Orthopedics was consulted with plan for operative management on 09/06/2023.  Patient underwent intramedullary nailing of the right hip 09/06/2023 by Dr. Carola Frost.  She tested positive for COVID on 09/08/2023 and completed isolation and was discharge to skilled nursing facility.  PAIN:  Are you having pain? Yes: NPRS scale: current 1-2/10; best 0/10 and sleep pretty; up to 8/10 at worst Pain location: R glute region Pain description: ache but sharp at times Aggravating factors: walking on unlevel surfaces Relieving factors: lying down; medication.   PRECAUTIONS: Fall  RED FLAGS: None   WEIGHT BEARING RESTRICTIONS: No  FALLS:  Has patient fallen in last 6 months? Yes. Number of falls 1  LIVING ENVIRONMENT: Lives with: lives with their spouse Lives in: House/apartment Stairs: Yes:  Internal: 2 steps; on right going up and grab bar on left. Has following equipment at home: Single point cane, Walker - 2 wheeled, Grab bars, and elevated toilet  OCCUPATION: Retired -  PLOF: Independent  PATIENT GOALS: I want to walk without my walker  NEXT MD VISIT: Soon - not exactly sure  OBJECTIVE:  Note: Objective measures were completed at Evaluation unless otherwise noted.  DIAGNOSTIC FINDINGS: CLINICAL DATA:  Fall onto right hip.   EXAM: DG HIP (WITH OR WITHOUT PELVIS) 2-3V RIGHT   COMPARISON:  None Available.   FINDINGS: There is an acute impacted intertrochanteric fracture of the proximal right femur with fracture margins extending through the greater and lesser trochanters. There is approximately 1.8 cm of lateral displacement of the distal segment with apex varus angulation. The right femoral head is seated within the acetabulum. Evaluation of the sacrum is limited due to overlying bowel-gas. The sacroiliac joints and pubic symphysis otherwise appear anatomically aligned.   IMPRESSION: Acute impacted, angulated, and displaced intertrochanteric fracture of the proximal right femur.     Electronically Signed  By: Hart Robinsons M.D.   On: 09/05/2023 08:21  PATIENT SURVEYS:  LEFS 22/80  COGNITION: Overall cognitive status: Within functional limits for tasks assessed     SENSATION: WFL  EDEMA:  Wearing compression stocking on left    PALPATION: (+) tenderness along right posterior gluteal region  LOWER EXTREMITY ROM:  Active ROM Right eval Left eval  Hip flexion    Hip extension    Hip abduction    Hip adduction    Hip internal rotation    Hip external rotation    Knee flexion    Knee extension    Ankle dorsiflexion    Ankle plantarflexion    Ankle inversion    Ankle eversion     (Blank rows = not tested)  LOWER EXTREMITY MMT:  MMT Right eval Left eval  Hip flexion 3+ *pain 4  Hip extension 3+ 4  Hip abduction 3+ *pain 4   Hip adduction    Hip internal rotation 3+ 4  Hip external rotation 3+ 4  Knee flexion 4 4  Knee extension 4 4  Ankle dorsiflexion 4 4  Ankle plantarflexion    Ankle inversion    Ankle eversion     (Blank rows = not tested)  LOWER EXTREMITY SPECIAL TESTS:  To be assessed next visit  FUNCTIONAL TESTS:  5 times sit to stand: 13.98 sec with heavy BUE Support - unable to perform without UE support despite multiple attempts Timed up and go (TUG): 28.14 sec with RW 6 minute walk test: to be assessed visit #2 10 meter walk test: 15.22 and 15.77 sec with RW= 0.65 m/s Berg Balance Scale: To be assessed visit #2  GAIT: Distance walked: approx 100 feet Assistive device utilized: Walker - 2 wheeled Level of assistance: CGA Comments: decreased step length, Increased UE support on walker, antalgic gait                                                                                                                                TREATMENT DATE: 11/20/2023      Manual therapy:   STM using "the Stick" to right gluteal region in sidelye x 8 min     THEREX:  Passive Hip flex/ER/IR within patient pain tolerance Gluteal sets- hold 5 sec x 10  Attempted bridging x 5 (stopped due to increased pain)  Sidelye hip clamshell (minimal height to avoid pain) 2x 10 Supine Hip add squeeze with 5 sec hold x 10 reps Sitting ham curl with RTB x 15 reps each LE       PATIENT EDUCATION:  Education details: Exercise technique Person educated: Patient and Spouse Education method: Explanation, Demonstration, Tactile cues, and Verbal cues Education comprehension: verbalized understanding and returned demonstration  HOME EXERCISE PROGRAM: Access Code: [facility] URL: [email or web address] Date: 11/20/2023 Prepared by: Maureen Ralphs  Exercises - Supine Bridge  - 3 x weekly - 3 sets - 10 reps - Clamshell with Resistance  -  3 x weekly - 3 sets - 10 reps - Standing Diagonal Hip  Extension and External Rotation  - 3 x weekly - 3 sets - 10 reps - Standing Hip Abduction with Counter Support  - 3 x weekly - 3 sets - 10 reps - Standing March with Counter Support  - 3 x weekly - 3 sets - 10 reps     Access Code: [email or web address] URL: [email or web address] Date: 11/13/2023 Prepared by: Maureen Ralphs  Exercises - Supine Piriformis Stretch with Foot on Ground  - 1 x daily - 3 sets - 30 sec hold - Seated Piriformis Stretch with Trunk Bend  - 1 x daily - 3 sets - 30 sec hold - Seated Piriformis Release With [employer]  - 1 x daily - 3 sets - 10 reps  ASSESSMENT:  CLINICAL IMPRESSION: Patient presents well motivated yet pain has not progressed much to date. Instructed to modify reps if feeling like doing too much and instructed in gluteal squeeze without pain. States the massage stick does feel good during treatment and did not report any increased pain upon leaving today. Will continue with manual therapy and progressive exercises to improve her functional mobility.   Patient will benefit from skilled PT services to address her right hip pain, muscle weakness, and impaired mobility to restore her to her previous level of independence with improved quality of life and decreased risk of falling.   OBJECTIVE IMPAIRMENTS: Abnormal gait, decreased activity tolerance, decreased balance, decreased coordination, decreased endurance, decreased mobility, difficulty walking, decreased strength, and pain.   ACTIVITY LIMITATIONS: carrying, lifting, bending, standing, squatting, stairs, and transfers  PARTICIPATION LIMITATIONS: meal prep, cleaning, laundry, shopping, community activity, and yard work  PERSONAL FACTORS: Age and 1-2 comorbidities: anxiety, Osteoporosis  are also affecting patient's functional outcome.   REHAB POTENTIAL: Good  CLINICAL DECISION MAKING: Evolving/moderate complexity  EVALUATION COMPLEXITY: Moderate   GOALS: Goals reviewed with patient?  Yes  SHORT TERM GOALS: Target date: 12/20/2023 Pt will be independent with HEP in order to improve strength and balance in order to decrease fall risk and improve function at home and work.  Baseline: EVAL: No formal HEP in place Goal status: INITIAL   LONG TERM GOALS: Target date: 02/01/2024  Pt will increase LEFS by at least 9 points in order to demonstrate significant improvement in lower extremity function. Baseline: EVAL: 22/80 Goal status: INITIAL  2.  Pt will decrease worst pain as reported on NPRS by at least 3 points in order to demonstrate clinically significant reduction in ankle/foot pain.  Baseline: EVAL= 8/10 right hip pain at worst Goal status: INITIAL  3.  Pt will decrease 5TSTS by at least 3 seconds in order to demonstrate clinically significant improvement in LE strength. Baseline: EVAL 13.98 sec with heavy BUE Support - unable to perform without UE support despite multiple attempts  Goal status: INITIAL  4.  Pt will decrease TUG to below 14 seconds/decrease in order to demonstrate decreased fall risk. Baseline: EVAL=28.14 sec with RW Goal status: INITIAL  5.  Patient will improve gait speed by 0.2 m/s for improved functional mobility and decreased risk of falling. Baseline: EVAL=0.65 m/s with RW Goal status: INITIAL  6.  Pt will improve BERG by at least 3 points in order to demonstrate clinically significant improvement in balance.  Baseline: EVAL- To be assessed visit #2; 11/13/2023= 45/56 Goal status: INITIAL  7. Pt will increase (able to complete the test without stopping) and > 800 feet in order to  demonstrate clinically significant improvement in cardiopulmonary endurance and community ambulation   Baseline: 11/20/2023= 450 feet in 4 min (Stopped due to pain  Goal status: NEW   PLAN:  PT FREQUENCY: 1-2x/week  PT DURATION: 12 weeks  PLANNED INTERVENTIONS: [id number]- PT Re-evaluation, 97110-Therapeutic exercises, 97530- Therapeutic activity, [id number]  Neuromuscular re-education, 97535- Self Care, [id number]- Manual therapy, [id number]- Gait training, [phone]- Orthotic Fit/training, [phone]- Canalith repositioning, [phone]- Electrical stimulation (manual), Patient/Family education, Balance training, Stair training, Taping, Dry Needling, Joint mobilization, Spinal mobilization, Scar mobilization, Compression bandaging, Vestibular training, DME instructions, Cryotherapy, and Moist heat  PLAN FOR NEXT SESSION:  progress  strengthening and balance activities. Mobility with education in gait training and use of appropriate AD. Progress HEP.    Lenda Kelp, PT 11/23/2023, 9:04 AM

## 2023-11-23 ENCOUNTER — Ambulatory Visit: Admitting: Physical Therapy

## 2023-11-26 NOTE — Therapy (Signed)
 OUTPATIENT PHYSICAL THERAPY LOWER EXTREMITY TREATMTENT   Patient Name: Sarah Harper MRN: [id number] DOB:[birth year], 83 y.o., female Today's Date: 11/27/2023  END OF SESSION:  PT End of Session - 11/27/23 1023     Visit Number 5    Number of Visits 24    Date for PT Re-Evaluation 01/31/24    Progress Note Due on Visit 10    PT Start Time 1023    PT Stop Time 1100    PT Time Calculation (min) 37 min    Equipment Utilized During Treatment Gait belt    Activity Tolerance Patient tolerated treatment well    Behavior During Therapy WFL for tasks assessed/performed               Past Medical History:  Diagnosis Date   Anxiety    Blood loss anemia 09/13/2023   Cataract 1997   Colon cancer screening 02/05/2018   DDD (degenerative disc disease)    chronic back pain   ETD (eustachian tube dysfunction)    GERD (gastroesophageal reflux disease)    Hypertension 2022   IBS (irritable bowel syndrome)    Meniere's disease    Migraine    still gets visual aura from time to time   Mild cognitive impairment    Osteoporosis    Sleep apnea    CPAP   Stroke (HCC) 2022   Syncopal episodes    after work-up - possible seizures?   Thyroid disease    hypothyroid   TIA (transient ischemic attack) 11/09/2020   Past Surgical History:  Procedure Laterality Date   APPENDECTOMY     CATARACT EXTRACTION W/PHACO Right 05/21/2019   Procedure: CATARACT EXTRACTION PHACO AND INTRAOCULAR LENS PLACEMENT (IOC) RIGHT panoptix lens  00:35.0  21.7%  7.61;  Surgeon: Lockie Mola, MD;  Location: [facility];  Service: Ophthalmology;  Laterality: Right;  sleep apnea requests early   CATARACT EXTRACTION W/PHACO Left 06/11/2019   Procedure: CATARACT EXTRACTION PHACO AND INTRAOCULAR LENS PLACEMENT (IOC) LEFT PANOPTIX TORIC LENS  00:50.0  20.3%  10.21;  Surgeon: Lockie Mola, MD;  Location: [facility];  Service: Ophthalmology;  Laterality: Left;  sleep apnea requests early    COLONOSCOPY     last 2012 - [facility]   ESOPHAGOGASTRODUODENOSCOPY     Multiple, last 01/07/2017 with Savary dilation to 18 mm   EYE SURGERY  August 2022   Cataracs removed   HEMORRHOID BANDING  2018   [facility]   INTRAMEDULLARY (IM) NAIL INTERTROCHANTERIC Right 09/06/2023   Procedure: INTRAMEDULLARY (IM) NAIL INTERTROCHANTERIC;  Surgeon: Myrene Galas, MD;  Location: [facility];  Service: Orthopedics;  Laterality: Right;   LUMBAR DISC SURGERY  1984   L5    Patient Active Problem List   Diagnosis Date Noted   Skin lesion of right leg 10/18/2023   Hyponatremia 10/18/2023   Blood loss anemia 09/13/2023   Closed right hip fracture (HCC) 09/05/2023   Abnormal urinalysis 09/02/2023   Acute cystitis 08/21/2023   Current use of proton pump inhibitor 03/13/2023   Pain in joint, lower leg 01/30/2023   Senile purpura (HCC) 12/26/2022   Skin tear of right lower leg without complication 12/26/2022   Dysuria 10/21/2021   Elevated alkaline phosphatase level 03/11/2021   Palpitations 11/10/2020   Migraine    Hypertension, essential    H/O: CVA (cerebrovascular accident) 11/08/2020   Headache 09/26/2019   Fall at home 06/02/2019   Colon cancer screening 02/05/2018   Degenerative joint disease (DJD) of lumbar spine 05/02/2017  Internal hemorrhoids 05/02/2017   Generalized osteoarthritis of hand 12/11/2016   GAD (generalized anxiety disorder) 12/11/2016   Estrogen deficiency 05/11/2016   Encounter for screening mammogram for breast cancer 05/11/2016   OSA (obstructive sleep apnea) 03/06/2016   Fatigue 01/25/2016   Left knee pain 10/18/2015   Snoring 10/18/2015   Routine general medical examination at a health care facility 02/28/2015   Encounter for Medicare annual wellness exam 02/06/2013   Hyperlipidemia 08/27/2012   Irregular heart beat 08/05/2012   Encounter for gynecological examination 12/20/2010   Osteopenia 12/17/2009   BACK PAIN, LUMBAR 07/02/2009   IRRITABLE BOWEL SYNDROME  04/02/2009   Asymptomatic postmenopausal status 12/10/2008   Hypothyroidism 12/05/2007   Meniere's disease 12/05/2007   GERD 12/05/2007    PCP: Dr. Roxy Manns  REFERRING PROVIDER: Dr. Myrene Galas  REFERRING DIAG: Right intertrochanteric fracture; Piriformis syndrome  THERAPY DIAG:  Abnormality of gait and mobility  Difficulty in walking, not elsewhere classified  Muscle weakness (generalized)  Other abnormalities of gait and mobility  Other lack of coordination  Pain in right hip  Rationale for Evaluation and Treatment: Rehabilitation  ONSET DATE: 09/05/2023  SUBJECTIVE:   SUBJECTIVE STATEMENT:  Patient reports she has been feeling some better and states her weekend was good but today complaining of more stiffness. "I am doing more overall- more cooking and housework."    PERTINENT HISTORY: Per hospital H&P from 09/05/2023: Sarah Harper is a 83 year old female with past medical history significant for HTN, hypothyroidism, GERD, osteopenia, dyslipidemia, OSA who presented to [facility] ED on 1/15 fall mechanical fall at home sustaining injury to her right hip with acute pain.  Unable to ambulate.  Workup in the ED notable for acute impacted, angulated, displaced intertrochanteric fracture proximal right femur.  Orthopedics was consulted with plan for operative management on 09/06/2023.  Patient underwent intramedullary nailing of the right hip 09/06/2023 by Dr. Carola Frost.  She tested positive for COVID on 09/08/2023 and completed isolation and was discharge to skilled nursing facility.  PAIN:  Are you having pain? Yes: NPRS scale: current 1-2/10; best 0/10 and sleep pretty; up to 8/10 at worst Pain location: R glute region Pain description: ache but sharp at times Aggravating factors: walking on unlevel surfaces Relieving factors: lying down; medication.   PRECAUTIONS: Fall  RED FLAGS: None   WEIGHT BEARING RESTRICTIONS: No  FALLS:  Has patient fallen in last 6 months? Yes.  Number of falls 1  LIVING ENVIRONMENT: Lives with: lives with their spouse Lives in: House/apartment Stairs: Yes: Internal: 2 steps; on right going up and grab bar on left. Has following equipment at home: Single point cane, Walker - 2 wheeled, Grab bars, and elevated toilet  OCCUPATION: Retired -  PLOF: Independent  PATIENT GOALS: I want to walk without my walker  NEXT MD VISIT: 12/12/2023  OBJECTIVE:  Note: Objective measures were completed at Evaluation unless otherwise noted.  DIAGNOSTIC FINDINGS: CLINICAL DATA:  Fall onto right hip.   EXAM: DG HIP (WITH OR WITHOUT PELVIS) 2-3V RIGHT   COMPARISON:  None Available.   FINDINGS: There is an acute impacted intertrochanteric fracture of the proximal right femur with fracture margins extending through the greater and lesser trochanters. There is approximately 1.8 cm of lateral displacement of the distal segment with apex varus angulation. The right femoral head is seated within the acetabulum. Evaluation of the sacrum is limited due to overlying bowel-gas. The sacroiliac joints and pubic symphysis otherwise appear anatomically aligned.   IMPRESSION: Acute impacted, angulated,  and displaced intertrochanteric fracture of the proximal right femur.     Electronically Signed   By: Hart Robinsons M.D.   On: 09/05/2023 08:21  PATIENT SURVEYS:  LEFS 22/80  COGNITION: Overall cognitive status: Within functional limits for tasks assessed     SENSATION: WFL  EDEMA:  Wearing compression stocking on left    PALPATION: (+) tenderness along right posterior gluteal region  LOWER EXTREMITY ROM:  Active ROM Right eval Left eval  Hip flexion    Hip extension    Hip abduction    Hip adduction    Hip internal rotation    Hip external rotation    Knee flexion    Knee extension    Ankle dorsiflexion    Ankle plantarflexion    Ankle inversion    Ankle eversion     (Blank rows = not tested)  LOWER EXTREMITY  MMT:  MMT Right eval Left eval  Hip flexion 3+ *pain 4  Hip extension 3+ 4  Hip abduction 3+ *pain 4  Hip adduction    Hip internal rotation 3+ 4  Hip external rotation 3+ 4  Knee flexion 4 4  Knee extension 4 4  Ankle dorsiflexion 4 4  Ankle plantarflexion    Ankle inversion    Ankle eversion     (Blank rows = not tested)  LOWER EXTREMITY SPECIAL TESTS:  To be assessed next visit  FUNCTIONAL TESTS:  5 times sit to stand: 13.98 sec with heavy BUE Support - unable to perform without UE support despite multiple attempts Timed up and go (TUG): 28.14 sec with RW 6 minute walk test: to be assessed visit #2 10 meter walk test: 15.22 and 15.77 sec with RW= 0.65 m/s Berg Balance Scale: To be assessed visit #2  GAIT: Distance walked: approx 100 feet Assistive device utilized: Walker - 2 wheeled Level of assistance: CGA Comments: decreased step length, Increased UE support on walker, antalgic gait                                                                                                                                TREATMENT DATE: 11/20/2023      Manual therapy:   STM using "the Stick" to right hip flexor/quad/IT band  region x 8 min     THEREX: (all supine/sidelye activities performed with MHP to gluteal region)  Gluteal sets- hold 5 sec x 10  Heel slide Left LE x 10 and Right  Supine Hooklye Hip add/abd (knees bent) x 10 reps ea side Supine lower trunk rotation x 15 reps each way Sidelye hip clamshell (minimal height to avoid pain) 2x 10 Standing hip flexor stretch on RLE - hold 30 sec x 3      PATIENT EDUCATION:  Education details: Exercise technique Person educated: Patient and Spouse Education method: Explanation, Demonstration, Tactile cues, and Verbal cues Education comprehension: verbalized understanding and returned demonstration  HOME EXERCISE PROGRAM: Access Code:  [facility] URL: [email or web address] Date: 11/20/2023 Prepared by:  Maureen Ralphs  Exercises - Supine Bridge  - 3 x weekly - 3 sets - 10 reps - Clamshell with Resistance  - 3 x weekly - 3 sets - 10 reps - Standing Diagonal Hip Extension and External Rotation  - 3 x weekly - 3 sets - 10 reps - Standing Hip Abduction with Counter Support  - 3 x weekly - 3 sets - 10 reps - Standing March with Counter Support  - 3 x weekly - 3 sets - 10 reps     Access Code: [email or web address] URL: [email or web address] Date: 11/13/2023 Prepared by: Maureen Ralphs  Exercises - Supine Piriformis Stretch with Foot on Ground  - 1 x daily - 3 sets - 30 sec hold - Seated Piriformis Stretch with Trunk Bend  - 1 x daily - 3 sets - 30 sec hold - Seated Piriformis Release With [employer]  - 1 x daily - 3 sets - 10 reps  ASSESSMENT:  CLINICAL IMPRESSION: Patient presented a few min late today due to another medical appt but presented with good motivation. She was able to negotiate around the pain better and able to perform both stretching and strengthening better overall- rated right hip pain down to a 2/10 at end of session. She is able to discern pain vs. Stretch and negotiate around the exercises well today. Patient will benefit from skilled PT services to address her right hip pain, muscle weakness, and impaired mobility to restore her to her previous level of independence with improved quality of life and decreased risk of falling.   OBJECTIVE IMPAIRMENTS: Abnormal gait, decreased activity tolerance, decreased balance, decreased coordination, decreased endurance, decreased mobility, difficulty walking, decreased strength, and pain.   ACTIVITY LIMITATIONS: carrying, lifting, bending, standing, squatting, stairs, and transfers  PARTICIPATION LIMITATIONS: meal prep, cleaning, laundry, shopping, community activity, and yard work  PERSONAL FACTORS: Age and 1-2 comorbidities: anxiety, Osteoporosis  are also affecting patient's functional outcome.   REHAB  POTENTIAL: Good  CLINICAL DECISION MAKING: Evolving/moderate complexity  EVALUATION COMPLEXITY: Moderate   GOALS: Goals reviewed with patient? Yes  SHORT TERM GOALS: Target date: 12/20/2023 Pt will be independent with HEP in order to improve strength and balance in order to decrease fall risk and improve function at home and work.  Baseline: EVAL: No formal HEP in place Goal status: INITIAL   LONG TERM GOALS: Target date: 02/01/2024  Pt will increase LEFS by at least 9 points in order to demonstrate significant improvement in lower extremity function. Baseline: EVAL: 22/80 Goal status: INITIAL  2.  Pt will decrease worst pain as reported on NPRS by at least 3 points in order to demonstrate clinically significant reduction in ankle/foot pain.  Baseline: EVAL= 8/10 right hip pain at worst Goal status: INITIAL  3.  Pt will decrease 5TSTS by at least 3 seconds in order to demonstrate clinically significant improvement in LE strength. Baseline: EVAL 13.98 sec with heavy BUE Support - unable to perform without UE support despite multiple attempts  Goal status: INITIAL  4.  Pt will decrease TUG to below 14 seconds/decrease in order to demonstrate decreased fall risk. Baseline: EVAL=28.14 sec with RW Goal status: INITIAL  5.  Patient will improve gait speed by 0.2 m/s for improved functional mobility and decreased risk of falling. Baseline: EVAL=0.65 m/s with RW Goal status: INITIAL  6.  Pt will improve BERG by at least 3 points in order to demonstrate clinically significant improvement in balance.  Baseline:  EVAL- To be assessed visit #2; 11/13/2023= 45/56 Goal status: INITIAL  7. Pt will increase (able to complete the test without stopping) and > 800 feet in order to demonstrate clinically significant improvement in cardiopulmonary endurance and community ambulation   Baseline: 11/20/2023= 450 feet in 4 min (Stopped due to pain  Goal status: NEW   PLAN:  PT FREQUENCY:  1-2x/week  PT DURATION: 12 weeks  PLANNED INTERVENTIONS: 97164- PT Re-evaluation, 97110-Therapeutic exercises, 97530- Therapeutic activity, 97112- Neuromuscular re-education, 97535- Self Care, 62130- Manual therapy, [id number]- Gait training, [phone]- Orthotic Fit/training, [phone]- Canalith repositioning, [phone]- Electrical stimulation (manual), Patient/Family education, Balance training, Stair training, Taping, Dry Needling, Joint mobilization, Spinal mobilization, Scar mobilization, Compression bandaging, Vestibular training, DME instructions, Cryotherapy, and Moist heat  PLAN FOR NEXT SESSION:  progress  strengthening and balance activities. Mobility with education in gait training and use of appropriate AD. Progress HEP.    Lenda Kelp, PT 11/27/2023, 11:16 AM

## 2023-11-27 ENCOUNTER — Ambulatory Visit

## 2023-11-27 DIAGNOSIS — R278 Other lack of coordination: Secondary | ICD-10-CM

## 2023-11-27 DIAGNOSIS — R262 Difficulty in walking, not elsewhere classified: Secondary | ICD-10-CM

## 2023-11-27 DIAGNOSIS — M25551 Pain in right hip: Secondary | ICD-10-CM

## 2023-11-27 DIAGNOSIS — R269 Unspecified abnormalities of gait and mobility: Secondary | ICD-10-CM

## 2023-11-27 DIAGNOSIS — R2689 Other abnormalities of gait and mobility: Secondary | ICD-10-CM

## 2023-11-27 DIAGNOSIS — M6281 Muscle weakness (generalized): Secondary | ICD-10-CM

## 2023-11-29 ENCOUNTER — Ambulatory Visit

## 2023-11-29 DIAGNOSIS — R2689 Other abnormalities of gait and mobility: Secondary | ICD-10-CM

## 2023-11-29 DIAGNOSIS — R278 Other lack of coordination: Secondary | ICD-10-CM

## 2023-11-29 DIAGNOSIS — R269 Unspecified abnormalities of gait and mobility: Secondary | ICD-10-CM | POA: Diagnosis not present

## 2023-11-29 DIAGNOSIS — M6281 Muscle weakness (generalized): Secondary | ICD-10-CM

## 2023-11-29 DIAGNOSIS — M25551 Pain in right hip: Secondary | ICD-10-CM

## 2023-11-29 DIAGNOSIS — R262 Difficulty in walking, not elsewhere classified: Secondary | ICD-10-CM

## 2023-11-29 NOTE — Therapy (Signed)
 OUTPATIENT PHYSICAL THERAPY LOWER EXTREMITY TREATMTENT   Patient Name: Sarah Harper MRN: [id number] DOB:1940/12/03, 83 y.o., female Today's Date: 11/29/2023  END OF SESSION:  PT End of Session - 11/29/23 1204     Visit Number 6    Number of Visits 24    Date for PT Re-Evaluation 01/31/24    Progress Note Due on Visit 10    PT Start Time 1202    PT Stop Time 1232    PT Time Calculation (min) 30 min    Equipment Utilized During Treatment Gait belt    Activity Tolerance Patient tolerated treatment well    Behavior During Therapy [facility] for tasks assessed/performed               Past Medical History:  Diagnosis Date   Anxiety    Blood loss anemia 09/13/2023   Cataract 1997   Colon cancer screening 02/05/2018   DDD (degenerative disc disease)    chronic back pain   ETD (eustachian tube dysfunction)    GERD (gastroesophageal reflux disease)    Hypertension 2022   IBS (irritable bowel syndrome)    Meniere's disease    Migraine    still gets visual aura from time to time   Mild cognitive impairment    Osteoporosis    Sleep apnea    CPAP   Stroke (HCC) 2022   Syncopal episodes    after work-up - possible seizures?   Thyroid disease    hypothyroid   TIA (transient ischemic attack) 11/09/2020   Past Surgical History:  Procedure Laterality Date   APPENDECTOMY     CATARACT EXTRACTION W/PHACO Right 05/21/2019   Procedure: CATARACT EXTRACTION PHACO AND INTRAOCULAR LENS PLACEMENT (IOC) RIGHT panoptix lens  00:35.0  21.7%  7.61;  Surgeon: Lockie Mola, MD;  Location: [facility];  Service: Ophthalmology;  Laterality: Right;  sleep apnea requests early   CATARACT EXTRACTION W/PHACO Left 06/11/2019   Procedure: CATARACT EXTRACTION PHACO AND INTRAOCULAR LENS PLACEMENT (IOC) LEFT PANOPTIX TORIC LENS  00:50.0  20.3%  10.21;  Surgeon: Lockie Mola, MD;  Location: [facility];  Service: Ophthalmology;  Laterality: Left;  sleep apnea requests  early   COLONOSCOPY     last 2012 - [facility]   ESOPHAGOGASTRODUODENOSCOPY     Multiple, last 01/07/2017 with Savary dilation to 18 mm   EYE SURGERY  August 2022   Cataracs removed   HEMORRHOID BANDING  2018   [facility]   INTRAMEDULLARY (IM) NAIL INTERTROCHANTERIC Right 09/06/2023   Procedure: INTRAMEDULLARY (IM) NAIL INTERTROCHANTERIC;  Surgeon: Myrene Galas, MD;  Location: [facility] OR;  Service: Orthopedics;  Laterality: Right;   LUMBAR DISC SURGERY  1984   L5    Patient Active Problem List   Diagnosis Date Noted   Skin lesion of right leg 10/18/2023   Hyponatremia 10/18/2023   Blood loss anemia 09/13/2023   Closed right hip fracture (HCC) 09/05/2023   Abnormal urinalysis 09/02/2023   Acute cystitis 08/21/2023   Current use of proton pump inhibitor 03/13/2023   Pain in joint, lower leg 01/30/2023   Senile purpura (HCC) 12/26/2022   Skin tear of right lower leg without complication 12/26/2022   Dysuria 10/21/2021   Elevated alkaline phosphatase level 03/11/2021   Palpitations 11/10/2020   Migraine    Hypertension, essential    H/O: CVA (cerebrovascular accident) 11/08/2020   Headache 09/26/2019   Fall at home 06/02/2019   Colon cancer screening 02/05/2018   Degenerative joint disease (DJD) of lumbar spine 05/02/2017  Internal hemorrhoids 05/02/2017   Generalized osteoarthritis of hand 12/11/2016   GAD (generalized anxiety disorder) 12/11/2016   Estrogen deficiency 05/11/2016   Encounter for screening mammogram for breast cancer 05/11/2016   OSA (obstructive sleep apnea) 03/06/2016   Fatigue 01/25/2016   Left knee pain 10/18/2015   Snoring 10/18/2015   Routine general medical examination at a health care facility 02/28/2015   Encounter for Medicare annual wellness exam 02/06/2013   Hyperlipidemia 08/27/2012   Irregular heart beat 08/05/2012   Encounter for gynecological examination 12/20/2010   Osteopenia 12/17/2009   BACK PAIN, LUMBAR 07/02/2009   IRRITABLE BOWEL SYNDROME  04/02/2009   Asymptomatic postmenopausal status 12/10/2008   Hypothyroidism 12/05/2007   Meniere's disease 12/05/2007   GERD 12/05/2007    PCP: Dr. Roxy Manns  REFERRING PROVIDER: Dr. Myrene Galas  REFERRING DIAG: Right intertrochanteric fracture; Piriformis syndrome  THERAPY DIAG:  Abnormality of gait and mobility  Difficulty in walking, not elsewhere classified  Muscle weakness (generalized)  Other abnormalities of gait and mobility  Other lack of coordination  Pain in right hip  Rationale for Evaluation and Treatment: Rehabilitation  ONSET DATE: 09/05/2023  SUBJECTIVE:   SUBJECTIVE STATEMENT:  Patient reports 1/10 right hip pain and states has decreased some reps with HEP and feels like this along with treatment has helped.   PERTINENT HISTORY: Per hospital H&P from 09/05/2023: Sarah Harper is a 83 year old female with past medical history significant for HTN, hypothyroidism, GERD, osteopenia, dyslipidemia, OSA who presented to [facility] ED on 1/15 fall mechanical fall at home sustaining injury to her right hip with acute pain.  Unable to ambulate.  Workup in the ED notable for acute impacted, angulated, displaced intertrochanteric fracture proximal right femur.  Orthopedics was consulted with plan for operative management on 09/06/2023.  Patient underwent intramedullary nailing of the right hip 09/06/2023 by Dr. Carola Frost.  She tested positive for COVID on 09/08/2023 and completed isolation and was discharge to skilled nursing facility.  PAIN:  Are you having pain? Yes: NPRS scale: current 1-2/10; best 0/10 and sleep pretty; up to 8/10 at worst Pain location: R glute region Pain description: ache but sharp at times Aggravating factors: walking on unlevel surfaces Relieving factors: lying down; medication.   PRECAUTIONS: Fall  RED FLAGS: None   WEIGHT BEARING RESTRICTIONS: No  FALLS:  Has patient fallen in last 6 months? Yes. Number of falls 1  LIVING  ENVIRONMENT: Lives with: lives with their spouse Lives in: House/apartment Stairs: Yes: Internal: 2 steps; on right going up and grab bar on left. Has following equipment at home: Single point cane, Walker - 2 wheeled, Grab bars, and elevated toilet  OCCUPATION: Retired -  PLOF: Independent  PATIENT GOALS: I want to walk without my walker  NEXT MD VISIT: 12/12/2023  OBJECTIVE:  Note: Objective measures were completed at Evaluation unless otherwise noted.  DIAGNOSTIC FINDINGS: CLINICAL DATA:  Fall onto right hip.   EXAM: DG HIP (WITH OR WITHOUT PELVIS) 2-3V RIGHT   COMPARISON:  None Available.   FINDINGS: There is an acute impacted intertrochanteric fracture of the proximal right femur with fracture margins extending through the greater and lesser trochanters. There is approximately 1.8 cm of lateral displacement of the distal segment with apex varus angulation. The right femoral head is seated within the acetabulum. Evaluation of the sacrum is limited due to overlying bowel-gas. The sacroiliac joints and pubic symphysis otherwise appear anatomically aligned.   IMPRESSION: Acute impacted, angulated, and displaced intertrochanteric fracture of the proximal  right femur.     Electronically Signed   By: Hart Robinsons M.D.   On: 09/05/2023 08:21  PATIENT SURVEYS:  LEFS 22/80  COGNITION: Overall cognitive status: Within functional limits for tasks assessed     SENSATION: WFL  EDEMA:  Wearing compression stocking on left    PALPATION: (+) tenderness along right posterior gluteal region  LOWER EXTREMITY ROM:  Active ROM Right eval Left eval  Hip flexion    Hip extension    Hip abduction    Hip adduction    Hip internal rotation    Hip external rotation    Knee flexion    Knee extension    Ankle dorsiflexion    Ankle plantarflexion    Ankle inversion    Ankle eversion     (Blank rows = not tested)  LOWER EXTREMITY MMT:  MMT Right eval  Left eval  Hip flexion 3+ *pain 4  Hip extension 3+ 4  Hip abduction 3+ *pain 4  Hip adduction    Hip internal rotation 3+ 4  Hip external rotation 3+ 4  Knee flexion 4 4  Knee extension 4 4  Ankle dorsiflexion 4 4  Ankle plantarflexion    Ankle inversion    Ankle eversion     (Blank rows = not tested)  LOWER EXTREMITY SPECIAL TESTS:  To be assessed next visit  FUNCTIONAL TESTS:  5 times sit to stand: 13.98 sec with heavy BUE Support - unable to perform without UE support despite multiple attempts Timed up and go (TUG): 28.14 sec with RW 6 minute walk test: to be assessed visit #2 10 meter walk test: 15.22 and 15.77 sec with RW= 0.65 m/s Berg Balance Scale: To be assessed visit #2  GAIT: Distance walked: approx 100 feet Assistive device utilized: Walker - 2 wheeled Level of assistance: CGA Comments: decreased step length, Increased UE support on walker, antalgic gait                                                                                                                                TREATMENT DATE: 11/29/2023        THERAPEUTIC ACTIVITIES:  Standing weight bearing activities:  Standing hip march - 2x 12 reps Standing hip ext- 2 x 12 reps Standing hip abd 2 x 12 reps Standing knee flex 2 x 12 reps Standing heel raises 2 x 12 reps Standing toe raises 2 x 12 reps  Standing step tap onto side of TM without UE support x 15 reps each side Standing side step up/over 1/2 spike ball x 15 reps.      PATIENT EDUCATION:  Education details: Exercise technique Person educated: Patient and Spouse Education method: Explanation, Demonstration, Tactile cues, and Verbal cues Education comprehension: verbalized understanding and returned demonstration  HOME EXERCISE PROGRAM: Access Code: [facility] URL: [email or web address] Date: 11/20/2023 Prepared by: Maureen Ralphs  Exercises - Supine Bridge  - 3 x weekly - 3  sets - 10 reps - Clamshell with  Resistance  - 3 x weekly - 3 sets - 10 reps - Standing Diagonal Hip Extension and External Rotation  - 3 x weekly - 3 sets - 10 reps - Standing Hip Abduction with Counter Support  - 3 x weekly - 3 sets - 10 reps - Standing March with Counter Support  - 3 x weekly - 3 sets - 10 reps     Access Code: [email or web address] URL: [email or web address] Date: 11/13/2023 Prepared by: Maureen Ralphs  Exercises - Supine Piriformis Stretch with Foot on Ground  - 1 x daily - 3 sets - 30 sec hold - Seated Piriformis Stretch with Trunk Bend  - 1 x daily - 3 sets - 30 sec hold - Seated Piriformis Release With [employer]  - 1 x daily - 3 sets - 10 reps  ASSESSMENT:  CLINICAL IMPRESSION: Patient presented late today but reported doing well and less pain overall. She was able to progress to standing activities incorporating weight bearing without any significant increase in pain. She was able to modify height of lift with march and abd (stopped just below level of pain). Patient able to stand well today without any report of increase pain and stated feeling better after session. Patient will benefit from skilled PT services to address her right hip pain, muscle weakness, and impaired mobility to restore her to her previous level of independence with improved quality of life and decreased risk of falling.   OBJECTIVE IMPAIRMENTS: Abnormal gait, decreased activity tolerance, decreased balance, decreased coordination, decreased endurance, decreased mobility, difficulty walking, decreased strength, and pain.   ACTIVITY LIMITATIONS: carrying, lifting, bending, standing, squatting, stairs, and transfers  PARTICIPATION LIMITATIONS: meal prep, cleaning, laundry, shopping, community activity, and yard work  PERSONAL FACTORS: Age and 1-2 comorbidities: anxiety, Osteoporosis  are also affecting patient's functional outcome.   REHAB POTENTIAL: Good  CLINICAL DECISION MAKING: Evolving/moderate  complexity  EVALUATION COMPLEXITY: Moderate   GOALS: Goals reviewed with patient? Yes  SHORT TERM GOALS: Target date: 12/20/2023 Pt will be independent with HEP in order to improve strength and balance in order to decrease fall risk and improve function at home and work.  Baseline: EVAL: No formal HEP in place Goal status: INITIAL   LONG TERM GOALS: Target date: 02/01/2024  Pt will increase LEFS by at least 9 points in order to demonstrate significant improvement in lower extremity function. Baseline: EVAL: 22/80 Goal status: INITIAL  2.  Pt will decrease worst pain as reported on NPRS by at least 3 points in order to demonstrate clinically significant reduction in ankle/foot pain.  Baseline: EVAL= 8/10 right hip pain at worst Goal status: INITIAL  3.  Pt will decrease 5TSTS by at least 3 seconds in order to demonstrate clinically significant improvement in LE strength. Baseline: EVAL 13.98 sec with heavy BUE Support - unable to perform without UE support despite multiple attempts  Goal status: INITIAL  4.  Pt will decrease TUG to below 14 seconds/decrease in order to demonstrate decreased fall risk. Baseline: EVAL=28.14 sec with RW Goal status: INITIAL  5.  Patient will improve gait speed by 0.2 m/s for improved functional mobility and decreased risk of falling. Baseline: EVAL=0.65 m/s with RW Goal status: INITIAL  6.  Pt will improve BERG by at least 3 points in order to demonstrate clinically significant improvement in balance.  Baseline: EVAL- To be assessed visit #2; 11/13/2023= 45/56 Goal status: INITIAL  7. Pt will increase (able to  complete the test without stopping) and > 800 feet in order to demonstrate clinically significant improvement in cardiopulmonary endurance and community ambulation   Baseline: 11/20/2023= 450 feet in 4 min (Stopped due to pain  Goal status: NEW   PLAN:  PT FREQUENCY: 1-2x/week  PT DURATION: 12 weeks  PLANNED INTERVENTIONS: [id number]-  PT Re-evaluation, 97110-Therapeutic exercises, 97530- Therapeutic activity, 97112- Neuromuscular re-education, 97535- Self Care, 16109- Manual therapy, [id number]- Gait training, [phone]- Orthotic Fit/training, [phone]- Canalith repositioning, [phone]- Electrical stimulation (manual), Patient/Family education, Balance training, Stair training, Taping, Dry Needling, Joint mobilization, Spinal mobilization, Scar mobilization, Compression bandaging, Vestibular training, DME instructions, Cryotherapy, and Moist heat  PLAN FOR NEXT SESSION:  progress  strengthening and balance activities. Mobility with education in gait training and use of appropriate AD. Progress HEP. Continue to progress with standing weight bearing activities.    Lenda Kelp, PT 11/29/2023, 1:17 PM

## 2023-12-03 NOTE — Therapy (Signed)
 OUTPATIENT PHYSICAL THERAPY LOWER EXTREMITY TREATMTENT   Patient Name: Sarah Harper MRN: [id number] DOB:[birth year], 83 y.o., female Today's Date: 12/04/2023  END OF SESSION:  PT End of Session - 12/04/23 1013     Visit Number 7    Number of Visits 24    Date for PT Re-Evaluation 01/31/24    Progress Note Due on Visit 10    PT Start Time 1015    PT Stop Time 1057    PT Time Calculation (min) 42 min    Equipment Utilized During Treatment Gait belt    Activity Tolerance Patient tolerated treatment well    Behavior During Therapy WFL for tasks assessed/performed                Past Medical History:  Diagnosis Date   Anxiety    Blood loss anemia 09/13/2023   Cataract 1997   Colon cancer screening 02/05/2018   DDD (degenerative disc disease)    chronic back pain   ETD (eustachian tube dysfunction)    GERD (gastroesophageal reflux disease)    Hypertension 2022   IBS (irritable bowel syndrome)    Meniere's disease    Migraine    still gets visual aura from time to time   Mild cognitive impairment    Osteoporosis    Sleep apnea    CPAP   Stroke (HCC) 2022   Syncopal episodes    after work-up - possible seizures?   Thyroid disease    hypothyroid   TIA (transient ischemic attack) 11/09/2020   Past Surgical History:  Procedure Laterality Date   APPENDECTOMY     CATARACT EXTRACTION W/PHACO Right 05/21/2019   Procedure: CATARACT EXTRACTION PHACO AND INTRAOCULAR LENS PLACEMENT (IOC) RIGHT panoptix lens  00:35.0  21.7%  7.61;  Surgeon: Sarah Kidney, MD;  Location: [facility];  Service: Ophthalmology;  Laterality: Right;  sleep apnea requests early   CATARACT EXTRACTION W/PHACO Left 06/11/2019   Procedure: CATARACT EXTRACTION PHACO AND INTRAOCULAR LENS PLACEMENT (IOC) LEFT PANOPTIX TORIC LENS  00:50.0  20.3%  10.21;  Surgeon: Sarah Kidney, MD;  Location: [facility];  Service: Ophthalmology;  Laterality: Left;  sleep apnea requests  early   COLONOSCOPY     last 2012 - [facility]   ESOPHAGOGASTRODUODENOSCOPY     Multiple, last 01/07/2017 with Savary dilation to 18 mm   EYE SURGERY  August 2022   Cataracs removed   HEMORRHOID BANDING  2018   [facility]   INTRAMEDULLARY (IM) NAIL INTERTROCHANTERIC Right 09/06/2023   Procedure: INTRAMEDULLARY (IM) NAIL INTERTROCHANTERIC;  Surgeon: Sarah Lia, MD;  Location: [facility];  Service: Orthopedics;  Laterality: Right;   LUMBAR DISC SURGERY  1984   L5    Patient Active Problem List   Diagnosis Date Noted   Skin lesion of right leg 10/18/2023   Hyponatremia 10/18/2023   Blood loss anemia 09/13/2023   Closed right hip fracture (HCC) 09/05/2023   Abnormal urinalysis 09/02/2023   Acute cystitis 08/21/2023   Current use of proton pump inhibitor 03/13/2023   Pain in joint, lower leg 01/30/2023   Senile purpura (HCC) 12/26/2022   Skin tear of right lower leg without complication 12/26/2022   Dysuria 10/21/2021   Elevated alkaline phosphatase level 03/11/2021   Palpitations 11/10/2020   Migraine    Hypertension, essential    H/O: CVA (cerebrovascular accident) 11/08/2020   Headache 09/26/2019   Fall at home 06/02/2019   Colon cancer screening 02/05/2018   Degenerative joint disease (DJD) of lumbar spine  05/02/2017   Internal hemorrhoids 05/02/2017   Generalized osteoarthritis of hand 12/11/2016   GAD (generalized anxiety disorder) 12/11/2016   Estrogen deficiency 05/11/2016   Encounter for screening mammogram for breast cancer 05/11/2016   OSA (obstructive sleep apnea) 03/06/2016   Fatigue 01/25/2016   Left knee pain 10/18/2015   Snoring 10/18/2015   Routine general medical examination at a health care facility 02/28/2015   Encounter for Medicare annual wellness exam 02/06/2013   Hyperlipidemia 08/27/2012   Irregular heart beat 08/05/2012   Encounter for gynecological examination 12/20/2010   Osteopenia 12/17/2009   BACK PAIN, LUMBAR 07/02/2009   IRRITABLE BOWEL SYNDROME  04/02/2009   Asymptomatic postmenopausal status 12/10/2008   Hypothyroidism 12/05/2007   Meniere's disease 12/05/2007   GERD 12/05/2007    PCP: Dr. Roxy Harper  REFERRING PROVIDER: Dr. Myrene Harper  REFERRING DIAG: Right intertrochanteric fracture; Piriformis syndrome  THERAPY DIAG:  Abnormality of gait and mobility  Difficulty in walking, not elsewhere classified  Muscle weakness (generalized)  Other abnormalities of gait and mobility  Pain in right hip  Other lack of coordination  Rationale for Evaluation and Treatment: Rehabilitation  ONSET DATE: 09/05/2023  SUBJECTIVE:   SUBJECTIVE STATEMENT:   Patient reports 3/10 pain in R hip on arrival. Reports she probably overdid it over the weekend when her sister came over and she was doing a lot of cooking and did not rest very much.    PERTINENT HISTORY: Per hospital H&P from 09/05/2023: Sarah Harper is a 83 year old female with past medical history significant for HTN, hypothyroidism, GERD, osteopenia, dyslipidemia, OSA who presented to [facility] ED on 1/15 fall mechanical fall at home sustaining injury to her right hip with acute pain.  Unable to ambulate.  Workup in the ED notable for acute impacted, angulated, displaced intertrochanteric fracture proximal right femur.  Orthopedics was consulted with plan for operative management on 09/06/2023.  Patient underwent intramedullary nailing of the right hip 09/06/2023 by Dr. Carola Harper.  She tested positive for COVID on 09/08/2023 and completed isolation and was discharge to skilled nursing facility.  PAIN:  Are you having pain? Yes: NPRS scale: current 1-2/10; best 0/10 and sleep pretty; up to 8/10 at worst Pain location: R glute region Pain description: ache but sharp at times Aggravating factors: walking on unlevel surfaces Relieving factors: lying down; medication.   PRECAUTIONS: Fall  RED FLAGS: None   WEIGHT BEARING RESTRICTIONS: No  FALLS:  Has patient fallen in last 6  months? Yes. Number of falls 1  LIVING ENVIRONMENT: Lives with: lives with their spouse Lives in: House/apartment Stairs: Yes: Internal: 2 steps; on right going up and grab bar on left. Has following equipment at home: Single point cane, Sarah Harper - 2 wheeled, Grab bars, and elevated toilet  OCCUPATION: Retired -  PLOF: Independent  PATIENT GOALS: I want to walk without my Boubacar Lerette  NEXT MD VISIT: 12/12/2023  OBJECTIVE:  Note: Objective measures were completed at Evaluation unless otherwise noted.  DIAGNOSTIC FINDINGS: CLINICAL DATA:  Fall onto right hip.   EXAM: DG HIP (WITH OR WITHOUT PELVIS) 2-3V RIGHT   COMPARISON:  None Available.   FINDINGS: There is an acute impacted intertrochanteric fracture of the proximal right femur with fracture margins extending through the greater and lesser trochanters. There is approximately 1.8 cm of lateral displacement of the distal segment with apex varus angulation. The right femoral head is seated within the acetabulum. Evaluation of the sacrum is limited due to overlying bowel-gas. The sacroiliac joints and pubic  symphysis otherwise appear anatomically aligned.   IMPRESSION: Acute impacted, angulated, and displaced intertrochanteric fracture of the proximal right femur.     Electronically Signed   By: Mannie Seek M.D.   On: 09/05/2023 08:21  PATIENT SURVEYS:  LEFS 22/80  COGNITION: Overall cognitive status: Within functional limits for tasks assessed     SENSATION: WFL  EDEMA:  Wearing compression stocking on left    PALPATION: (+) tenderness along right posterior gluteal region  LOWER EXTREMITY ROM:  Active ROM Right eval Left eval  Hip flexion    Hip extension    Hip abduction    Hip adduction    Hip internal rotation    Hip external rotation    Knee flexion    Knee extension    Ankle dorsiflexion    Ankle plantarflexion    Ankle inversion    Ankle eversion     (Blank rows = not tested)  LOWER  EXTREMITY MMT:  MMT Right eval Left eval  Hip flexion 3+ *pain 4  Hip extension 3+ 4  Hip abduction 3+ *pain 4  Hip adduction    Hip internal rotation 3+ 4  Hip external rotation 3+ 4  Knee flexion 4 4  Knee extension 4 4  Ankle dorsiflexion 4 4  Ankle plantarflexion    Ankle inversion    Ankle eversion     (Blank rows = not tested)  LOWER EXTREMITY SPECIAL TESTS:  To be assessed next visit  FUNCTIONAL TESTS:  5 times sit to stand: 13.98 sec with heavy BUE Support - unable to perform without UE support despite multiple attempts Timed up and go (TUG): 28.14 sec with RW 6 minute walk test: to be assessed visit #2 10 meter walk test: 15.22 and 15.77 sec with RW= 0.65 m/s Berg Balance Scale: To be assessed visit #2  GAIT: Distance walked: approx 100 feet Assistive device utilized: Deedra Pro - 2 wheeled Level of assistance: CGA Comments: decreased step length, Increased UE support on Tyrina Hines, antalgic gait                                                                                                                                TREATMENT DATE: 12/04/23    THERAPEUTIC ACTIVITIES:  Nustep level 2 x 6 minutes to increase BLE strength and endurance   Ambulation with SPC x 148', with hurrycane x 148'   Standing marching 2 x 12  Sit to stand (seated on airex) with no UE support 2 x 10  Ambulation with hurrycane x 148'  Standing hip abduction 2 x 12 each LE  Standing heel/toe raises x 20 each direction Ambulation with hurrycane x 148'  Standing 6" step taps with no UE support x 10 each LE  Ambulation with hurrycane x 148'    PATIENT EDUCATION:  Education details: Exercise technique Person educated: Patient and Spouse Education method: Explanation, Demonstration, Tactile cues, and Verbal cues Education comprehension: verbalized understanding  and returned demonstration  HOME EXERCISE PROGRAM: Access Code: [facility] URL: [email or web address] Date:  11/20/2023 Prepared by: Maureen Ralphs  Exercises - Supine Bridge  - 3 x weekly - 3 sets - 10 reps - Clamshell with Resistance  - 3 x weekly - 3 sets - 10 reps - Standing Diagonal Hip Extension and External Rotation  - 3 x weekly - 3 sets - 10 reps - Standing Hip Abduction with Counter Support  - 3 x weekly - 3 sets - 10 reps - Standing March with Counter Support  - 3 x weekly - 3 sets - 10 reps     Access Code: [email or web address] URL: [email or web address] Date: 11/13/2023 Prepared by: Maureen Ralphs  Exercises - Supine Piriformis Stretch with Foot on Ground  - 1 x daily - 3 sets - 30 sec hold - Seated Piriformis Stretch with Trunk Bend  - 1 x daily - 3 sets - 30 sec hold - Seated Piriformis Release With [employer]  - 1 x daily - 3 sets - 10 reps  ASSESSMENT:  CLINICAL IMPRESSION:    Patient motivated to participate in therapy session this date. Session focused on endurance training, BLE strength, and ambulation with hurrycane. Tolerated session well and did well with ambulation with hurrycane. Continues to be fearful of falling but motivated to improve strength and balance to be able to ambulate with no AD. Patient will benefit from skilled PT services to address her right hip pain, muscle weakness, and impaired mobility to restore her to her previous level of independence with improved quality of life and decreased risk of falling.    OBJECTIVE IMPAIRMENTS: Abnormal gait, decreased activity tolerance, decreased balance, decreased coordination, decreased endurance, decreased mobility, difficulty walking, decreased strength, and pain.   ACTIVITY LIMITATIONS: carrying, lifting, bending, standing, squatting, stairs, and transfers  PARTICIPATION LIMITATIONS: meal prep, cleaning, laundry, shopping, community activity, and yard work  PERSONAL FACTORS: Age and 1-2 comorbidities: anxiety, Osteoporosis  are also affecting patient's functional outcome.   REHAB POTENTIAL:  Good  CLINICAL DECISION MAKING: Evolving/moderate complexity  EVALUATION COMPLEXITY: Moderate   GOALS: Goals reviewed with patient? Yes  SHORT TERM GOALS: Target date: 12/20/2023 Pt will be independent with HEP in order to improve strength and balance in order to decrease fall risk and improve function at home and work.  Baseline: EVAL: No formal HEP in place Goal status: INITIAL   LONG TERM GOALS: Target date: 02/01/2024  Pt will increase LEFS by at least 9 points in order to demonstrate significant improvement in lower extremity function. Baseline: EVAL: 22/80 Goal status: INITIAL  2.  Pt will decrease worst pain as reported on NPRS by at least 3 points in order to demonstrate clinically significant reduction in ankle/foot pain.  Baseline: EVAL= 8/10 right hip pain at worst Goal status: INITIAL  3.  Pt will decrease 5TSTS by at least 3 seconds in order to demonstrate clinically significant improvement in LE strength. Baseline: EVAL 13.98 sec with heavy BUE Support - unable to perform without UE support despite multiple attempts  Goal status: INITIAL  4.  Pt will decrease TUG to below 14 seconds/decrease in order to demonstrate decreased fall risk. Baseline: EVAL=28.14 sec with RW Goal status: INITIAL  5.  Patient will improve gait speed by 0.2 m/s for improved functional mobility and decreased risk of falling. Baseline: EVAL=0.65 m/s with RW Goal status: INITIAL  6.  Pt will improve BERG by at least 3 points in order to demonstrate clinically significant improvement in balance.  Baseline: EVAL- To be assessed visit #2; 11/13/2023= 45/56 Goal status: INITIAL  7. Pt will increase (able to complete the test without stopping) and > 800 feet in order to demonstrate clinically significant improvement in cardiopulmonary endurance and community ambulation   Baseline: 11/20/2023= 450 feet in 4 min (Stopped due to pain  Goal status: NEW   PLAN:  PT FREQUENCY: 1-2x/week  PT  DURATION: 12 weeks  PLANNED INTERVENTIONS: 97164- PT Re-evaluation, 97110-Therapeutic exercises, 97530- Therapeutic activity, 97112- Neuromuscular re-education, 97535- Self Care, 16109- Manual therapy, [id number]- Gait training, [phone]- Orthotic Fit/training, [phone]- Canalith repositioning, [phone]- Electrical stimulation (manual), Patient/Family education, Balance training, Stair training, Taping, Dry Needling, Joint mobilization, Spinal mobilization, Scar mobilization, Compression bandaging, Vestibular training, DME instructions, Cryotherapy, and Moist heat  PLAN FOR NEXT SESSION:  progress  strengthening and balance activities. Mobility with education in gait training and use of appropriate AD. Progress HEP. Continue to progress with standing weight bearing activities.    Janine Melbourne, PT, DPT Physical Therapist - [facility]  12/04/2023, 10:14 AM

## 2023-12-04 ENCOUNTER — Ambulatory Visit

## 2023-12-04 DIAGNOSIS — M6281 Muscle weakness (generalized): Secondary | ICD-10-CM

## 2023-12-04 DIAGNOSIS — R269 Unspecified abnormalities of gait and mobility: Secondary | ICD-10-CM

## 2023-12-04 DIAGNOSIS — R262 Difficulty in walking, not elsewhere classified: Secondary | ICD-10-CM

## 2023-12-04 DIAGNOSIS — M25551 Pain in right hip: Secondary | ICD-10-CM

## 2023-12-04 DIAGNOSIS — R278 Other lack of coordination: Secondary | ICD-10-CM

## 2023-12-04 DIAGNOSIS — R2689 Other abnormalities of gait and mobility: Secondary | ICD-10-CM

## 2023-12-05 NOTE — Therapy (Signed)
 OUTPATIENT PHYSICAL THERAPY LOWER EXTREMITY TREATMTENT   Patient Name: Sarah Harper MRN: [id number] DOB:1940/10/29, 83 y.o., female Today's Date: 12/06/2023  END OF SESSION:  PT End of Session - 12/06/23 1010     Visit Number 8    Number of Visits 24    Date for PT Re-Evaluation 01/31/24    Progress Note Due on Visit 10    PT Start Time 1012    PT Stop Time 1056    PT Time Calculation (min) 44 min    Equipment Utilized During Treatment Gait belt    Activity Tolerance Patient tolerated treatment well    Behavior During Therapy WFL for tasks assessed/performed                 Past Medical History:  Diagnosis Date   Anxiety    Blood loss anemia 09/13/2023   Cataract 1997   Colon cancer screening 02/05/2018   DDD (degenerative disc disease)    chronic back pain   ETD (eustachian tube dysfunction)    GERD (gastroesophageal reflux disease)    Hypertension 2022   IBS (irritable bowel syndrome)    Meniere's disease    Migraine    still gets visual aura from time to time   Mild cognitive impairment    Osteoporosis    Sleep apnea    CPAP   Stroke (HCC) 2022   Syncopal episodes    after work-up - possible seizures?   Thyroid disease    hypothyroid   TIA (transient ischemic attack) 11/09/2020   Past Surgical History:  Procedure Laterality Date   APPENDECTOMY     CATARACT EXTRACTION W/PHACO Right 05/21/2019   Procedure: CATARACT EXTRACTION PHACO AND INTRAOCULAR LENS PLACEMENT (IOC) RIGHT panoptix lens  00:35.0  21.7%  7.61;  Surgeon: Lockie Mola, MD;  Location: [facility];  Service: Ophthalmology;  Laterality: Right;  sleep apnea requests early   CATARACT EXTRACTION W/PHACO Left 06/11/2019   Procedure: CATARACT EXTRACTION PHACO AND INTRAOCULAR LENS PLACEMENT (IOC) LEFT PANOPTIX TORIC LENS  00:50.0  20.3%  10.21;  Surgeon: Lockie Mola, MD;  Location: [facility];  Service: Ophthalmology;  Laterality: Left;  sleep apnea requests  early   COLONOSCOPY     last 2012 - [facility]   ESOPHAGOGASTRODUODENOSCOPY     Multiple, last 01/07/2017 with Savary dilation to 18 mm   EYE SURGERY  August 2022   Cataracs removed   HEMORRHOID BANDING  2018   [facility]   INTRAMEDULLARY (IM) NAIL INTERTROCHANTERIC Right 09/06/2023   Procedure: INTRAMEDULLARY (IM) NAIL INTERTROCHANTERIC;  Surgeon: Myrene Galas, MD;  Location: [facility];  Service: Orthopedics;  Laterality: Right;   LUMBAR DISC SURGERY  1984   L5    Patient Active Problem List   Diagnosis Date Noted   Skin lesion of right leg 10/18/2023   Hyponatremia 10/18/2023   Blood loss anemia 09/13/2023   Closed right hip fracture (HCC) 09/05/2023   Abnormal urinalysis 09/02/2023   Acute cystitis 08/21/2023   Current use of proton pump inhibitor 03/13/2023   Pain in joint, lower leg 01/30/2023   Senile purpura (HCC) 12/26/2022   Skin tear of right lower leg without complication 12/26/2022   Dysuria 10/21/2021   Elevated alkaline phosphatase level 03/11/2021   Palpitations 11/10/2020   Migraine    Hypertension, essential    H/O: CVA (cerebrovascular accident) 11/08/2020   Headache 09/26/2019   Fall at home 06/02/2019   Colon cancer screening 02/05/2018   Degenerative joint disease (DJD) of lumbar  spine 05/02/2017   Internal hemorrhoids 05/02/2017   Generalized osteoarthritis of hand 12/11/2016   GAD (generalized anxiety disorder) 12/11/2016   Estrogen deficiency 05/11/2016   Encounter for screening mammogram for breast cancer 05/11/2016   OSA (obstructive sleep apnea) 03/06/2016   Fatigue 01/25/2016   Left knee pain 10/18/2015   Snoring 10/18/2015   Routine general medical examination at a health care facility 02/28/2015   Encounter for Medicare annual wellness exam 02/06/2013   Hyperlipidemia 08/27/2012   Irregular heart beat 08/05/2012   Encounter for gynecological examination 12/20/2010   Osteopenia 12/17/2009   BACK PAIN, LUMBAR 07/02/2009   IRRITABLE BOWEL SYNDROME  04/02/2009   Asymptomatic postmenopausal status 12/10/2008   Hypothyroidism 12/05/2007   Meniere's disease 12/05/2007   GERD 12/05/2007    PCP: Dr. Roxy Manns  REFERRING PROVIDER: Dr. Myrene Galas  REFERRING DIAG: Right intertrochanteric fracture; Piriformis syndrome  THERAPY DIAG:  Abnormality of gait and mobility  Difficulty in walking, not elsewhere classified  Muscle weakness (generalized)  Other abnormalities of gait and mobility  Pain in right hip  Other lack of coordination  Rationale for Evaluation and Treatment: Rehabilitation  ONSET DATE: 09/05/2023  SUBJECTIVE:   SUBJECTIVE STATEMENT:   Patient reports 2/10 pain in R hip on arrival. States overall doing well without any new issues.   PERTINENT HISTORY: Per hospital H&P from 09/05/2023: Sarah Harper is a 83 year old female with past medical history significant for HTN, hypothyroidism, GERD, osteopenia, dyslipidemia, OSA who presented to [facility] ED on 1/15 fall mechanical fall at home sustaining injury to her right hip with acute pain.  Unable to ambulate.  Workup in the ED notable for acute impacted, angulated, displaced intertrochanteric fracture proximal right femur.  Orthopedics was consulted with plan for operative management on 09/06/2023.  Patient underwent intramedullary nailing of the right hip 09/06/2023 by Dr. Carola Frost.  She tested positive for COVID on 09/08/2023 and completed isolation and was discharge to skilled nursing facility.  PAIN:  Are you having pain? Yes: NPRS scale: current 1-2/10; best 0/10 and sleep pretty; up to 8/10 at worst Pain location: R glute region Pain description: ache but sharp at times Aggravating factors: walking on unlevel surfaces Relieving factors: lying down; medication.   PRECAUTIONS: Fall  RED FLAGS: None   WEIGHT BEARING RESTRICTIONS: No  FALLS:  Has patient fallen in last 6 months? Yes. Number of falls 1  LIVING ENVIRONMENT: Lives with: lives with their  spouse Lives in: House/apartment Stairs: Yes: Internal: 2 steps; on right going up and grab bar on left. Has following equipment at home: Single point cane, Walker - 2 wheeled, Grab bars, and elevated toilet  OCCUPATION: Retired -  PLOF: Independent  PATIENT GOALS: I want to walk without my walker  NEXT MD VISIT: 12/12/2023  OBJECTIVE:  Note: Objective measures were completed at Evaluation unless otherwise noted.  DIAGNOSTIC FINDINGS: CLINICAL DATA:  Fall onto right hip.   EXAM: DG HIP (WITH OR WITHOUT PELVIS) 2-3V RIGHT   COMPARISON:  None Available.   FINDINGS: There is an acute impacted intertrochanteric fracture of the proximal right femur with fracture margins extending through the greater and lesser trochanters. There is approximately 1.8 cm of lateral displacement of the distal segment with apex varus angulation. The right femoral head is seated within the acetabulum. Evaluation of the sacrum is limited due to overlying bowel-gas. The sacroiliac joints and pubic symphysis otherwise appear anatomically aligned.   IMPRESSION: Acute impacted, angulated, and displaced intertrochanteric fracture of the proximal right  femur.     Electronically Signed   By: Mannie Seek M.D.   On: 09/05/2023 08:21  PATIENT SURVEYS:  LEFS 22/80  COGNITION: Overall cognitive status: Within functional limits for tasks assessed     SENSATION: WFL  EDEMA:  Wearing compression stocking on left    PALPATION: (+) tenderness along right posterior gluteal region  LOWER EXTREMITY ROM:  Active ROM Right eval Left eval  Hip flexion    Hip extension    Hip abduction    Hip adduction    Hip internal rotation    Hip external rotation    Knee flexion    Knee extension    Ankle dorsiflexion    Ankle plantarflexion    Ankle inversion    Ankle eversion     (Blank rows = not tested)  LOWER EXTREMITY MMT:  MMT Right eval Left eval  Hip flexion 3+ *pain 4  Hip extension  3+ 4  Hip abduction 3+ *pain 4  Hip adduction    Hip internal rotation 3+ 4  Hip external rotation 3+ 4  Knee flexion 4 4  Knee extension 4 4  Ankle dorsiflexion 4 4  Ankle plantarflexion    Ankle inversion    Ankle eversion     (Blank rows = not tested)  LOWER EXTREMITY SPECIAL TESTS:  To be assessed next visit  FUNCTIONAL TESTS:  5 times sit to stand: 13.98 sec with heavy BUE Support - unable to perform without UE support despite multiple attempts Timed up and go (TUG): 28.14 sec with RW 6 minute walk test: to be assessed visit #2 10 meter walk test: 15.22 and 15.77 sec with RW= 0.65 m/s Berg Balance Scale: To be assessed visit #2  GAIT: Distance walked: approx 100 feet Assistive device utilized: Walker - 2 wheeled Level of assistance: CGA Comments: decreased step length, Increased UE support on walker, antalgic gait                                                                                                                                TREATMENT DATE: 12/06/23        THERAPEUTIC ACTIVITIES:  Standing in // bars performing- -Hip flex/ext (Active) for mobility and pain relief x 20  -Hip circles (CW/CCW) x 20 reps each direction  Step tap onto airex pad x 12 reps with BUE Support Step tap onto airex pad x 12 reps without BUE Support (no pain reported)   Step ups with minimal UE support x 12 reps alt LE Standing Side step up/over 1/2 foam x 15 reps  Standing heel raises x 20 BLE from 1/2 foam  Heel to toe gait sequencing activity in // bars - stepping forward onto 1/2 foam roll x 15 steps each Side- patient reported some mild "burning through right hip" with R LE as stance limb.   Ambulation using hurrycane x 148'  focusing on gait sequencing and maintaining erect posture.  PATIENT EDUCATION:  Education details: Exercise technique Person educated: Patient and Spouse Education method: Explanation, Demonstration, Tactile cues, and Verbal cues Education  comprehension: verbalized understanding and returned demonstration  HOME EXERCISE PROGRAM: Access Code: [facility] URL: [email or web address] Date: 11/20/2023 Prepared by: Ferrell Hu  Exercises - Supine Bridge  - 3 x weekly - 3 sets - 10 reps - Clamshell with Resistance  - 3 x weekly - 3 sets - 10 reps - Standing Diagonal Hip Extension and External Rotation  - 3 x weekly - 3 sets - 10 reps - Standing Hip Abduction with Counter Support  - 3 x weekly - 3 sets - 10 reps - Standing March with Counter Support  - 3 x weekly - 3 sets - 10 reps     Access Code: [email or web address] URL: [email or web address] Date: 11/13/2023 Prepared by: Ferrell Hu  Exercises - Supine Piriformis Stretch with Foot on Ground  - 1 x daily - 3 sets - 30 sec hold - Seated Piriformis Stretch with Trunk Bend  - 1 x daily - 3 sets - 30 sec hold - Seated Piriformis Release With [employer]  - 1 x daily - 3 sets - 10 reps  ASSESSMENT:  CLINICAL IMPRESSION:   Patient able to progress with more weight bearing activities again today- only c/o slight "burning through Right hip" after completing last weight bearing activity. She continues to require some VC for safe technique with walking using hurrycane. Patient will benefit from skilled PT services to address her right hip pain, muscle weakness, and impaired mobility to restore her to her previous level of independence with improved quality of life and decreased risk of falling.    OBJECTIVE IMPAIRMENTS: Abnormal gait, decreased activity tolerance, decreased balance, decreased coordination, decreased endurance, decreased mobility, difficulty walking, decreased strength, and pain.   ACTIVITY LIMITATIONS: carrying, lifting, bending, standing, squatting, stairs, and transfers  PARTICIPATION LIMITATIONS: meal prep, cleaning, laundry, shopping, community activity, and yard work  PERSONAL FACTORS: Age and 1-2 comorbidities: anxiety,  Osteoporosis  are also affecting patient's functional outcome.   REHAB POTENTIAL: Good  CLINICAL DECISION MAKING: Evolving/moderate complexity  EVALUATION COMPLEXITY: Moderate   GOALS: Goals reviewed with patient? Yes  SHORT TERM GOALS: Target date: 12/20/2023 Pt will be independent with HEP in order to improve strength and balance in order to decrease fall risk and improve function at home and work.  Baseline: EVAL: No formal HEP in place Goal status: INITIAL   LONG TERM GOALS: Target date: 02/01/2024  Pt will increase LEFS by at least 9 points in order to demonstrate significant improvement in lower extremity function. Baseline: EVAL: 22/80 Goal status: INITIAL  2.  Pt will decrease worst pain as reported on NPRS by at least 3 points in order to demonstrate clinically significant reduction in ankle/foot pain.  Baseline: EVAL= 8/10 right hip pain at worst Goal status: INITIAL  3.  Pt will decrease 5TSTS by at least 3 seconds in order to demonstrate clinically significant improvement in LE strength. Baseline: EVAL 13.98 sec with heavy BUE Support - unable to perform without UE support despite multiple attempts  Goal status: INITIAL  4.  Pt will decrease TUG to below 14 seconds/decrease in order to demonstrate decreased fall risk. Baseline: EVAL=28.14 sec with RW Goal status: INITIAL  5.  Patient will improve gait speed by 0.2 m/s for improved functional mobility and decreased risk of falling. Baseline: EVAL=0.65 m/s with RW Goal status: INITIAL  6.  Pt will improve BERG by at least 3 points  in order to demonstrate clinically significant improvement in balance.  Baseline: EVAL- To be assessed visit #2; 11/13/2023= 45/56 Goal status: INITIAL  7. Pt will increase (able to complete the test without stopping) and > 800 feet in order to demonstrate clinically significant improvement in cardiopulmonary endurance and community ambulation   Baseline: 11/20/2023= 450 feet in 4 min  (Stopped due to pain  Goal status: NEW   PLAN:  PT FREQUENCY: 1-2x/week  PT DURATION: 12 weeks  PLANNED INTERVENTIONS: 97164- PT Re-evaluation, 97110-Therapeutic exercises, 97530- Therapeutic activity, 97112- Neuromuscular re-education, 97535- Self Care, 16109- Manual therapy, [id number]- Gait training, [phone]- Orthotic Fit/training, [phone]- Canalith repositioning, [phone]- Electrical stimulation (manual), Patient/Family education, Balance training, Stair training, Taping, Dry Needling, Joint mobilization, Spinal mobilization, Scar mobilization, Compression bandaging, Vestibular training, DME instructions, Cryotherapy, and Moist heat  PLAN FOR NEXT SESSION:  progress  strengthening and balance activities. Mobility with education in gait training and use of appropriate AD. Progress HEP. Continue to progress with standing weight bearing activities.    Ossie Blend, PT Physical Therapist - [facility]  12/06/2023, 11:01 AM

## 2023-12-06 ENCOUNTER — Ambulatory Visit

## 2023-12-06 DIAGNOSIS — R2689 Other abnormalities of gait and mobility: Secondary | ICD-10-CM

## 2023-12-06 DIAGNOSIS — R269 Unspecified abnormalities of gait and mobility: Secondary | ICD-10-CM

## 2023-12-06 DIAGNOSIS — M25551 Pain in right hip: Secondary | ICD-10-CM

## 2023-12-06 DIAGNOSIS — M6281 Muscle weakness (generalized): Secondary | ICD-10-CM

## 2023-12-06 DIAGNOSIS — R262 Difficulty in walking, not elsewhere classified: Secondary | ICD-10-CM

## 2023-12-06 DIAGNOSIS — R278 Other lack of coordination: Secondary | ICD-10-CM

## 2023-12-10 ENCOUNTER — Ambulatory Visit: Payer: Self-pay

## 2023-12-10 NOTE — Telephone Encounter (Signed)
 Chief Complaint: Pt believes medications need adjusted Symptoms: None Frequency: Ongoing Pertinent Negatives: Patient denies SOB, CP, HA, vision changes, dizziness Disposition: [] ED /[] Urgent Care (no appt availability in office) / [x] Appointment(In office/virtual)/ []  [facility]/ [] Home Care/ [] Refused Recommended Disposition /[]  Mobile Bus/ []  Follow-up with PCP Additional Notes: Pt reports she was previously taking CBD supplements and her synthroid  dosage was adjusted. Pt notes she has been off this supplement for some time and she would like to follow up with provider for labs and possible medication adjustments. Pt denies any symptoms at this time, notes she has continued leg swelling but this is not new or worsening and PCP is aware. OV scheduled. This RN educated pt on home care, new-worsening symptoms, when to call back/seek emergent care. Pt verbalized understanding and agrees to plan.   Reason for Disposition  Prescription request for new medicine (not a refill)  Answer Assessment - Initial Assessment Questions 1. NAME of MEDICINE: "What medicine(s) are you calling about?"     Synthroid  2. QUESTION: "What is your question?" (e.g., double dose of medicine, side effect)     Pt believes dosage may need changed due to recent med changes 3. PRESCRIBER: "Who prescribed the medicine?" Reason: if prescribed by specialist, call should be referred to that group.     Dr. Malissa Se 4. SYMPTOMS: "Do you have any symptoms?" If Yes, ask: "What symptoms are you having?"  "How bad are the symptoms (e.g., mild, moderate, severe)     None  Protocols used: Medication Question Call-A-AH

## 2023-12-10 NOTE — Telephone Encounter (Signed)
 Will see on 4/24 as planned

## 2023-12-11 ENCOUNTER — Ambulatory Visit

## 2023-12-11 DIAGNOSIS — R278 Other lack of coordination: Secondary | ICD-10-CM

## 2023-12-11 DIAGNOSIS — R269 Unspecified abnormalities of gait and mobility: Secondary | ICD-10-CM | POA: Diagnosis not present

## 2023-12-11 DIAGNOSIS — R262 Difficulty in walking, not elsewhere classified: Secondary | ICD-10-CM

## 2023-12-11 DIAGNOSIS — M25551 Pain in right hip: Secondary | ICD-10-CM

## 2023-12-11 DIAGNOSIS — M6281 Muscle weakness (generalized): Secondary | ICD-10-CM

## 2023-12-11 DIAGNOSIS — R2689 Other abnormalities of gait and mobility: Secondary | ICD-10-CM

## 2023-12-11 NOTE — Therapy (Signed)
 OUTPATIENT PHYSICAL THERAPY LOWER EXTREMITY TREATMTENT   Patient Name: Sarah Harper MRN: [id number] DOB:02-21-1941, 83 y.o., female Today's Date: 12/11/2023  END OF SESSION:  PT End of Session - 12/11/23 1013     Visit Number 9    Number of Visits 24    Date for PT Re-Evaluation 01/31/24    Progress Note Due on Visit 10    PT Start Time 1014    PT Stop Time 1059    PT Time Calculation (min) 45 min    Equipment Utilized During Treatment Gait belt    Activity Tolerance Patient tolerated treatment well    Behavior During Therapy WFL for tasks assessed/performed                  Past Medical History:  Diagnosis Date   Anxiety    Blood loss anemia 09/13/2023   Cataract 1997   Colon cancer screening 02/05/2018   DDD (degenerative disc disease)    chronic back pain   ETD (eustachian tube dysfunction)    GERD (gastroesophageal reflux disease)    Hypertension 2022   IBS (irritable bowel syndrome)    Meniere's disease    Migraine    still gets visual aura from time to time   Mild cognitive impairment    Osteoporosis    Sleep apnea    CPAP   Stroke (HCC) 2022   Syncopal episodes    after work-up - possible seizures?   Thyroid  disease    hypothyroid   TIA (transient ischemic attack) 11/09/2020   Past Surgical History:  Procedure Laterality Date   APPENDECTOMY     CATARACT EXTRACTION W/PHACO Right 05/21/2019   Procedure: CATARACT EXTRACTION PHACO AND INTRAOCULAR LENS PLACEMENT (IOC) RIGHT panoptix lens  00:35.0  21.7%  7.61;  Surgeon: Annell Kidney, MD;  Location: [facility];  Service: Ophthalmology;  Laterality: Right;  sleep apnea requests early   CATARACT EXTRACTION W/PHACO Left 06/11/2019   Procedure: CATARACT EXTRACTION PHACO AND INTRAOCULAR LENS PLACEMENT (IOC) LEFT PANOPTIX TORIC LENS  00:50.0  20.3%  10.21;  Surgeon: Annell Kidney, MD;  Location: [facility];  Service: Ophthalmology;  Laterality: Left;  sleep  apnea requests early   COLONOSCOPY     last 2012 - [facility]   ESOPHAGOGASTRODUODENOSCOPY     Multiple, last 01/07/2017 with Savary dilation to 18 mm   EYE SURGERY  August 2022   Cataracs removed   HEMORRHOID BANDING  2018   [facility]   INTRAMEDULLARY (IM) NAIL INTERTROCHANTERIC Right 09/06/2023   Procedure: INTRAMEDULLARY (IM) NAIL INTERTROCHANTERIC;  Surgeon: Hardy Lia, MD;  Location: [facility];  Service: Orthopedics;  Laterality: Right;   LUMBAR DISC SURGERY  1984   L5    Patient Active Problem List   Diagnosis Date Noted   Skin lesion of right leg 10/18/2023   Hyponatremia 10/18/2023   Blood loss anemia 09/13/2023   Closed right hip fracture (HCC) 09/05/2023   Abnormal urinalysis 09/02/2023   Acute cystitis 08/21/2023   Current use of proton pump inhibitor 03/13/2023   Pain in joint, lower leg 01/30/2023   Senile purpura (HCC) 12/26/2022   Skin tear of right lower leg without complication 12/26/2022   Dysuria 10/21/2021   Elevated alkaline phosphatase level 03/11/2021   Palpitations 11/10/2020   Migraine    Hypertension, essential    H/O: CVA (cerebrovascular accident) 11/08/2020   Headache 09/26/2019   Fall at home 06/02/2019   Colon cancer screening 02/05/2018   Degenerative joint disease (DJD) of  lumbar spine 05/02/2017   Internal hemorrhoids 05/02/2017   Generalized osteoarthritis of hand 12/11/2016   GAD (generalized anxiety disorder) 12/11/2016   Estrogen deficiency 05/11/2016   Encounter for screening mammogram for breast cancer 05/11/2016   OSA (obstructive sleep apnea) 03/06/2016   Fatigue 01/25/2016   Left knee pain 10/18/2015   Snoring 10/18/2015   Routine general medical examination at a health care facility 02/28/2015   Encounter for Medicare annual wellness exam 02/06/2013   Hyperlipidemia 08/27/2012   Irregular heart beat 08/05/2012   Encounter for gynecological examination 12/20/2010   Osteopenia 12/17/2009   BACK PAIN, LUMBAR 07/02/2009   IRRITABLE  BOWEL SYNDROME 04/02/2009   Asymptomatic postmenopausal status 12/10/2008   Hypothyroidism 12/05/2007   Meniere's disease 12/05/2007   GERD 12/05/2007    PCP: Dr. Deri Fleet  REFERRING PROVIDER: Dr. Hardy Lia  REFERRING DIAG: Right intertrochanteric fracture; Piriformis syndrome  THERAPY DIAG:  Abnormality of gait and mobility  Difficulty in walking, not elsewhere classified  Muscle weakness (generalized)  Other abnormalities of gait and mobility  Pain in right hip  Other lack of coordination  Rationale for Evaluation and Treatment: Rehabilitation  ONSET DATE: 09/05/2023  SUBJECTIVE:   SUBJECTIVE STATEMENT:    Patient reports burning right posterior gluteal pain.   PERTINENT HISTORY: Per hospital H&P from 09/05/2023: Sarah Harper is a 83 year old female with past medical history significant for HTN, hypothyroidism, GERD, osteopenia, dyslipidemia, OSA who presented to [facility] ED on 1/15 fall mechanical fall at home sustaining injury to her right hip with acute pain.  Unable to ambulate.  Workup in the ED notable for acute impacted, angulated, displaced intertrochanteric fracture proximal right femur.  Orthopedics was consulted with plan for operative management on 09/06/2023.  Patient underwent intramedullary nailing of the right hip 09/06/2023 by Dr. Guyann Leitz.  She tested positive for COVID on 09/08/2023 and completed isolation and was discharge to skilled nursing facility.  PAIN:  Are you having pain? Yes: NPRS scale: current 1-2/10; best 0/10 and sleep pretty; up to 8/10 at worst Pain location: R glute region Pain description: ache but sharp at times Aggravating factors: walking on unlevel surfaces Relieving factors: lying down; medication.   PRECAUTIONS: Fall  RED FLAGS: None   WEIGHT BEARING RESTRICTIONS: No  FALLS:  Has patient fallen in last 6 months? Yes. Number of falls 1  LIVING ENVIRONMENT: Lives with: lives with their spouse Lives in:  House/apartment Stairs: Yes: Internal: 2 steps; on right going up and grab bar on left. Has following equipment at home: Single point cane, Walker - 2 wheeled, Grab bars, and elevated toilet  OCCUPATION: Retired -  PLOF: Independent  PATIENT GOALS: I want to walk without my walker  NEXT MD VISIT: 12/12/2023  OBJECTIVE:  Note: Objective measures were completed at Evaluation unless otherwise noted.  DIAGNOSTIC FINDINGS: CLINICAL DATA:  Fall onto right hip.   EXAM: DG HIP (WITH OR WITHOUT PELVIS) 2-3V RIGHT   COMPARISON:  None Available.   FINDINGS: There is an acute impacted intertrochanteric fracture of the proximal right femur with fracture margins extending through the greater and lesser trochanters. There is approximately 1.8 cm of lateral displacement of the distal segment with apex varus angulation. The right femoral head is seated within the acetabulum. Evaluation of the sacrum is limited due to overlying bowel-gas. The sacroiliac joints and pubic symphysis otherwise appear anatomically aligned.   IMPRESSION: Acute impacted, angulated, and displaced intertrochanteric fracture of the proximal right femur.     Electronically Signed  By: Mannie Seek M.D.   On: 09/05/2023 08:21  PATIENT SURVEYS:  LEFS 22/80  COGNITION: Overall cognitive status: Within functional limits for tasks assessed     SENSATION: WFL  EDEMA:  Wearing compression stocking on left    PALPATION: (+) tenderness along right posterior gluteal region  LOWER EXTREMITY ROM:  Active ROM Right eval Left eval  Hip flexion    Hip extension    Hip abduction    Hip adduction    Hip internal rotation    Hip external rotation    Knee flexion    Knee extension    Ankle dorsiflexion    Ankle plantarflexion    Ankle inversion    Ankle eversion     (Blank rows = not tested)  LOWER EXTREMITY MMT:  MMT Right eval Left eval  Hip flexion 3+ *pain 4  Hip extension 3+ 4  Hip  abduction 3+ *pain 4  Hip adduction    Hip internal rotation 3+ 4  Hip external rotation 3+ 4  Knee flexion 4 4  Knee extension 4 4  Ankle dorsiflexion 4 4  Ankle plantarflexion    Ankle inversion    Ankle eversion     (Blank rows = not tested)  LOWER EXTREMITY SPECIAL TESTS:  To be assessed next visit  FUNCTIONAL TESTS:  5 times sit to stand: 13.98 sec with heavy BUE Support - unable to perform without UE support despite multiple attempts Timed up and go (TUG): 28.14 sec with RW 6 minute walk test: to be assessed visit #2 10 meter walk test: 15.22 and 15.77 sec with RW= 0.65 m/s Berg Balance Scale: To be assessed visit #2  GAIT: Distance walked: approx 100 feet Assistive device utilized: Walker - 2 wheeled Level of assistance: CGA Comments: decreased step length, Increased UE support on walker, antalgic gait                                                                                                                                TREATMENT DATE: 12/11/23        THERAPEUTIC ACTIVITIES:  Standing at support bar performing- -Hip flex/ext (Active) for mobility and pain relief x 10 reps x 2 -Hip ext 3# AW 2 x 10 reps alt LE -Hip circles (CW/CCW) 2 x10 reps each direction -Hip circles (CW/CCW) x 12 reps each LE  Step tap onto "the step" 2 x 10 reps with BUE Support Modified lunge onto "the Step"  x15 reps each LE    Step ups with minimal UE support onto "the step" x 12 reps alt LE using 3#AW Standing Side step tap onto "the step"   x 15 reps each Side with 3# AW- to improve weight bearing and RLE strength.  Standing heel raises x 20 BLE from 1/2 foam     Ambulation using hurrycane x 50 feet x multiple trials- captured gait speed on last 3 trials - measured at  0.56 m/s then 0.67m/s then 0.65 m/s  focusing on gait sequencing and maintaining erect posture.   Sit to stand without UE support  from varying heights on mat table - 24 in x 3; 23 in x 3; 22 in x  3    PATIENT EDUCATION:  Education details: Exercise technique Person educated: Patient and Spouse Education method: Explanation, Demonstration, Tactile cues, and Verbal cues Education comprehension: verbalized understanding and returned demonstration  HOME EXERCISE PROGRAM: Access Code: [facility] URL: [email or web address] Date: 11/20/2023 Prepared by: Ferrell Hu  Exercises - Supine Bridge  - 3 x weekly - 3 sets - 10 reps - Clamshell with Resistance  - 3 x weekly - 3 sets - 10 reps - Standing Diagonal Hip Extension and External Rotation  - 3 x weekly - 3 sets - 10 reps - Standing Hip Abduction with Counter Support  - 3 x weekly - 3 sets - 10 reps - Standing March with Counter Support  - 3 x weekly - 3 sets - 10 reps     Access Code: [email or web address] URL: [email or web address] Date: 11/13/2023 Prepared by: Ferrell Hu  Exercises - Supine Piriformis Stretch with Foot on Ground  - 1 x daily - 3 sets - 30 sec hold - Seated Piriformis Stretch with Trunk Bend  - 1 x daily - 3 sets - 30 sec hold - Seated Piriformis Release With [employer]  - 1 x daily - 3 sets - 10 reps  ASSESSMENT:  CLINICAL IMPRESSION:   Patient presents with improved LE strength and weight bearing without increasing right gluteal pain today. She presents with improving gait sequencing with cane without any report of increased pain. Gait speed today with cane = initial gait speed using walker. Patient has follow up with MD tomorrow and to discuss ongoing report of pain and "catching" with transfers to see if any further testing warranted.  Patient will benefit from skilled PT services to address her right hip pain, muscle weakness, and impaired mobility to restore her to her previous level of independence with improved quality of life and decreased risk of falling.    OBJECTIVE IMPAIRMENTS: Abnormal gait, decreased activity tolerance, decreased balance, decreased  coordination, decreased endurance, decreased mobility, difficulty walking, decreased strength, and pain.   ACTIVITY LIMITATIONS: carrying, lifting, bending, standing, squatting, stairs, and transfers  PARTICIPATION LIMITATIONS: meal prep, cleaning, laundry, shopping, community activity, and yard work  PERSONAL FACTORS: Age and 1-2 comorbidities: anxiety, Osteoporosis  are also affecting patient's functional outcome.   REHAB POTENTIAL: Good  CLINICAL DECISION MAKING: Evolving/moderate complexity  EVALUATION COMPLEXITY: Moderate   GOALS: Goals reviewed with patient? Yes  SHORT TERM GOALS: Target date: 12/20/2023 Pt will be independent with HEP in order to improve strength and balance in order to decrease fall risk and improve function at home and work.  Baseline: EVAL: No formal HEP in place Goal status: INITIAL   LONG TERM GOALS: Target date: 02/01/2024  Pt will increase LEFS by at least 9 points in order to demonstrate significant improvement in lower extremity function. Baseline: EVAL: 22/80 Goal status: INITIAL  2.  Pt will decrease worst pain as reported on NPRS by at least 3 points in order to demonstrate clinically significant reduction in ankle/foot pain.  Baseline: EVAL= 8/10 right hip pain at worst Goal status: INITIAL  3.  Pt will decrease 5TSTS by at least 3 seconds in order to demonstrate clinically significant improvement in LE strength. Baseline: EVAL 13.98 sec with heavy BUE Support - unable to perform  without UE support despite multiple attempts  Goal status: INITIAL  4.  Pt will decrease TUG to below 14 seconds/decrease in order to demonstrate decreased fall risk. Baseline: EVAL=28.14 sec with RW Goal status: INITIAL  5.  Patient will improve gait speed by 0.2 m/s for improved functional mobility and decreased risk of falling. Baseline: EVAL=0.65 m/s with RW Goal status: INITIAL  6.  Pt will improve BERG by at least 3 points in order to demonstrate  clinically significant improvement in balance.  Baseline: EVAL- To be assessed visit #2; 11/13/2023= 45/56 Goal status: INITIAL  7. Pt will increase (able to complete the test without stopping) and > 800 feet in order to demonstrate clinically significant improvement in cardiopulmonary endurance and community ambulation   Baseline: 11/20/2023= 450 feet in 4 min (Stopped due to pain  Goal status: NEW   PLAN:  PT FREQUENCY: 1-2x/week  PT DURATION: 12 weeks  PLANNED INTERVENTIONS: 97164- PT Re-evaluation, 97110-Therapeutic exercises, 97530- Therapeutic activity, 97112- Neuromuscular re-education, 97535- Self Care, 16109- Manual therapy, [id number]- Gait training, [phone]- Orthotic Fit/training, [phone]- Canalith repositioning, [phone]- Electrical stimulation (manual), Patient/Family education, Balance training, Stair training, Taping, Dry Needling, Joint mobilization, Spinal mobilization, Scar mobilization, Compression bandaging, Vestibular training, DME instructions, Cryotherapy, and Moist heat  PLAN FOR NEXT SESSION:  progress  strengthening and balance activities. Mobility with education in gait training and use of appropriate AD. Progress HEP. Continue to progress with standing weight bearing activities.    Ossie Blend, PT Physical Therapist - [facility]  12/11/2023, 11:20 AM

## 2023-12-13 ENCOUNTER — Ambulatory Visit

## 2023-12-13 ENCOUNTER — Encounter: Payer: Self-pay | Admitting: Family Medicine

## 2023-12-13 ENCOUNTER — Ambulatory Visit (INDEPENDENT_AMBULATORY_CARE_PROVIDER_SITE_OTHER): Admitting: Family Medicine

## 2023-12-13 VITALS — BP 136/80 | HR 65 | Temp 98.0°F | Ht 64.0 in | Wt 128.0 lb

## 2023-12-13 DIAGNOSIS — R269 Unspecified abnormalities of gait and mobility: Secondary | ICD-10-CM | POA: Diagnosis not present

## 2023-12-13 DIAGNOSIS — M6281 Muscle weakness (generalized): Secondary | ICD-10-CM

## 2023-12-13 DIAGNOSIS — F411 Generalized anxiety disorder: Secondary | ICD-10-CM

## 2023-12-13 DIAGNOSIS — D500 Iron deficiency anemia secondary to blood loss (chronic): Secondary | ICD-10-CM

## 2023-12-13 DIAGNOSIS — Z79899 Other long term (current) drug therapy: Secondary | ICD-10-CM | POA: Diagnosis not present

## 2023-12-13 DIAGNOSIS — E039 Hypothyroidism, unspecified: Secondary | ICD-10-CM

## 2023-12-13 DIAGNOSIS — I10 Essential (primary) hypertension: Secondary | ICD-10-CM | POA: Diagnosis not present

## 2023-12-13 DIAGNOSIS — M47816 Spondylosis without myelopathy or radiculopathy, lumbar region: Secondary | ICD-10-CM

## 2023-12-13 DIAGNOSIS — R262 Difficulty in walking, not elsewhere classified: Secondary | ICD-10-CM

## 2023-12-13 DIAGNOSIS — R2689 Other abnormalities of gait and mobility: Secondary | ICD-10-CM

## 2023-12-13 DIAGNOSIS — M25551 Pain in right hip: Secondary | ICD-10-CM

## 2023-12-13 DIAGNOSIS — R5383 Other fatigue: Secondary | ICD-10-CM | POA: Diagnosis not present

## 2023-12-13 DIAGNOSIS — R278 Other lack of coordination: Secondary | ICD-10-CM

## 2023-12-13 DIAGNOSIS — S72001S Fracture of unspecified part of neck of right femur, sequela: Secondary | ICD-10-CM | POA: Diagnosis not present

## 2023-12-13 LAB — CBC WITH DIFFERENTIAL/PLATELET
Basophils Absolute: 0.1 10*3/uL (ref 0.0–0.1)
Basophils Relative: 1.1 % (ref 0.0–3.0)
Eosinophils Absolute: 0.2 10*3/uL (ref 0.0–0.7)
Eosinophils Relative: 3.5 % (ref 0.0–5.0)
HCT: 35.7 % — ABNORMAL LOW (ref 36.0–46.0)
Hemoglobin: 11.8 g/dL — ABNORMAL LOW (ref 12.0–15.0)
Lymphocytes Relative: 30.2 % (ref 12.0–46.0)
Lymphs Abs: 1.7 10*3/uL (ref 0.7–4.0)
MCHC: 33 g/dL (ref 30.0–36.0)
MCV: 90.3 fl (ref 78.0–100.0)
Monocytes Absolute: 0.6 10*3/uL (ref 0.1–1.0)
Monocytes Relative: 10.3 % (ref 3.0–12.0)
Neutro Abs: 3.1 10*3/uL (ref 1.4–7.7)
Neutrophils Relative %: 54.9 % (ref 43.0–77.0)
Platelets: 283 10*3/uL (ref 150.0–400.0)
RBC: 3.95 Mil/uL (ref 3.87–5.11)
RDW: 14.5 % (ref 11.5–15.5)
WBC: 5.7 10*3/uL (ref 4.0–10.5)

## 2023-12-13 LAB — COMPREHENSIVE METABOLIC PANEL WITH GFR
ALT: 14 U/L (ref 0–35)
AST: 19 U/L (ref 0–37)
Albumin: 4.3 g/dL (ref 3.5–5.2)
Alkaline Phosphatase: 107 U/L (ref 39–117)
BUN: 18 mg/dL (ref 6–23)
CO2: 29 meq/L (ref 19–32)
Calcium: 9.3 mg/dL (ref 8.4–10.5)
Chloride: 104 meq/L (ref 96–112)
Creatinine, Ser: 0.7 mg/dL (ref 0.40–1.20)
GFR: 80.49 mL/min (ref >60.00)
Glucose, Bld: 80 mg/dL (ref 70–99)
Potassium: 3.8 meq/L (ref 3.5–5.1)
Sodium: 139 meq/L (ref 135–145)
Total Bilirubin: 0.5 mg/dL (ref 0.2–1.2)
Total Protein: 6.7 g/dL (ref 6.0–8.3)

## 2023-12-13 LAB — TSH: TSH: 0.89 u[IU]/mL (ref 0.35–5.50)

## 2023-12-13 LAB — VITAMIN B12: Vitamin B-12: 466 pg/mL (ref 211–911)

## 2023-12-13 LAB — IRON: Iron: 63 ug/dL (ref 42–145)

## 2023-12-13 LAB — FERRITIN: Ferritin: 13.7 ng/mL (ref 10.0–291.0)

## 2023-12-13 NOTE — Assessment & Plan Note (Signed)
 More lately  Off CBD Recovering from hip fracture - has anemia and in PT  Hypothyroid  On ppi  Labs ordered

## 2023-12-13 NOTE — Assessment & Plan Note (Signed)
 More fatigued  TSH ordered  Takes levothyroxine  75 mcg daily   Has stopped CBD for now

## 2023-12-13 NOTE — Progress Notes (Signed)
 Subjective:    Patient ID: Sarah Harper, female    DOB: September 29, 1940, 83 y.o.   MRN: [id number]  HPI  Wt Readings from Last 3 Encounters:  12/13/23 128 lb (58.1 kg)  11/12/23 127 lb 2.1 oz (57.7 kg)  10/18/23 127 lb 2 oz (57.7 kg)   21.97 kg/m  Vitals:   12/13/23 0927  BP: 136/80  Pulse: 65  Temp: 98 F (36.7 C)  SpO2: 98%    Pt presents for follow up of hypothyroidism More fatigued lately   Had hip fracture in February and had surgery and still in PT Using cane    Lab Results  Component Value Date   TSH 1.538 09/05/2023   Takes levothyroxine  75 mcg daily   Feels tired most of the time May be due to walker and PT and recovery  Both sleepy and weary feeling  Sleep is fair  Has to get up to urinate more often -wakes up more than she used to  Some pain still in right hip  (had ortho follow up yesterday)  Some sciatic nerve problems also with walking / has arthritis in back also-discussed getting injection     She used to take CBD supplement for mood  Has stopped that when she was in hospital  Felt like it was no longer worth it  Was concerned that this change may affect thyroid  requirement    Anxiety is stable  Was worse when going through hip fracture  Husband covid and he is doing better now     HTN bp is stable today  No cp or palpitations or headaches or edema  No side effects to medicines  BP Readings from Last 3 Encounters:  12/13/23 136/80  10/18/23 134/70  09/17/23 134/69    Amlodipine  2.5 mg daily  Metoprolol  12.5 mg bid   Lab Results  Component Value Date   NA 137 10/18/2023   K 3.4 (L) 10/18/2023   CO2 29 10/18/2023   GLUCOSE 83 10/18/2023   BUN 12 10/18/2023   CREATININE 0.63 10/18/2023   CALCIUM  9.0 10/18/2023   GFR 82.65 10/18/2023   EGFR 82 12/08/2020   GFRNONAA >60 09/17/2023   Lab Results  Component Value Date   ALT 14 03/13/2023   AST 19 03/13/2023   ALKPHOS 104 03/13/2023   BILITOT 0.6 03/13/2023   Lab  Results  Component Value Date   WBC 5.4 10/18/2023   HGB 10.9 (L) 10/18/2023   HCT 33.0 (L) 10/18/2023   MCV 91.7 10/18/2023   PLT 340.0 10/18/2023   Lab Results  Component Value Date   TSH 1.538 09/05/2023   Lab Results  Component Value Date   VITAMINB12 535 03/13/2023   Last vitamin D  Lab Results  Component Value Date   VD25OH 44.40 09/07/2023      Patient Active Problem List   Diagnosis Date Noted   Skin lesion of right leg 10/18/2023   Hyponatremia 10/18/2023   Blood loss anemia 09/13/2023   Closed right hip fracture (HCC) 09/05/2023   Abnormal urinalysis 09/02/2023   Acute cystitis 08/21/2023   Current use of proton pump inhibitor 03/13/2023   Pain in joint, lower leg 01/30/2023   Senile purpura (HCC) 12/26/2022   Skin tear of right lower leg without complication 12/26/2022   Dysuria 10/21/2021   Elevated alkaline phosphatase level 03/11/2021   Palpitations 11/10/2020   Migraine    Hypertension, essential    H/O: CVA (cerebrovascular accident) 11/08/2020   Headache 09/26/2019  Fall at home 06/02/2019   Colon cancer screening 02/05/2018   Degenerative joint disease (DJD) of lumbar spine 05/02/2017   Internal hemorrhoids 05/02/2017   Generalized osteoarthritis of hand 12/11/2016   GAD (generalized anxiety disorder) 12/11/2016   Estrogen deficiency 05/11/2016   Encounter for screening mammogram for breast cancer 05/11/2016   OSA (obstructive sleep apnea) 03/06/2016   Fatigue 01/25/2016   Left knee pain 10/18/2015   Snoring 10/18/2015   Routine general medical examination at a health care facility 02/28/2015   Encounter for Medicare annual wellness exam 02/06/2013   Hyperlipidemia 08/27/2012   Irregular heart beat 08/05/2012   Encounter for gynecological examination 12/20/2010   Osteopenia 12/17/2009   BACK PAIN, LUMBAR 07/02/2009   IRRITABLE BOWEL SYNDROME 04/02/2009   Asymptomatic postmenopausal status 12/10/2008   Hypothyroidism 12/05/2007    Meniere's disease 12/05/2007   GERD 12/05/2007   Past Medical History:  Diagnosis Date   Anxiety    Blood loss anemia 09/13/2023   Cataract 1997   Colon cancer screening 02/05/2018   DDD (degenerative disc disease)    chronic back pain   ETD (eustachian tube dysfunction)    GERD (gastroesophageal reflux disease)    Hypertension 2022   IBS (irritable bowel syndrome)    Meniere's disease    Migraine    still gets visual aura from time to time   Mild cognitive impairment    Osteoporosis    Sleep apnea    CPAP   Stroke (HCC) 2022   Syncopal episodes    after work-up - possible seizures?   Thyroid  disease    hypothyroid   TIA (transient ischemic attack) 11/09/2020   Past Surgical History:  Procedure Laterality Date   APPENDECTOMY     CATARACT EXTRACTION W/PHACO Right 05/21/2019   Procedure: CATARACT EXTRACTION PHACO AND INTRAOCULAR LENS PLACEMENT (IOC) RIGHT panoptix lens  00:35.0  21.7%  7.61;  Surgeon: Annell Kidney, MD;  Location: [facility];  Service: Ophthalmology;  Laterality: Right;  sleep apnea requests early   CATARACT EXTRACTION W/PHACO Left 06/11/2019   Procedure: CATARACT EXTRACTION PHACO AND INTRAOCULAR LENS PLACEMENT (IOC) LEFT PANOPTIX TORIC LENS  00:50.0  20.3%  10.21;  Surgeon: Annell Kidney, MD;  Location: [facility];  Service: Ophthalmology;  Laterality: Left;  sleep apnea requests early   COLONOSCOPY     last 2012 - [facility]   ESOPHAGOGASTRODUODENOSCOPY     Multiple, last 01/07/2017 with Savary dilation to 18 mm   EYE SURGERY  August 2022   Cataracs removed   HEMORRHOID BANDING  2018   [facility]   INTRAMEDULLARY (IM) NAIL INTERTROCHANTERIC Right 09/06/2023   Procedure: INTRAMEDULLARY (IM) NAIL INTERTROCHANTERIC;  Surgeon: Hardy Lia, MD;  Location: [facility];  Service: Orthopedics;  Laterality: Right;   LUMBAR DISC SURGERY  1984   L5    Social History   Tobacco Use   Smoking status: Never    Passive exposure: Never    Smokeless tobacco: Never  Vaping Use   Vaping status: Never Used  Substance Use Topics   Alcohol use: Yes    Comment: Average 1 glass of wine/week   Drug use: Yes    Types: Marijuana    Comment: CBD drops   Family History  Problem Relation Age of Onset   Diabetes Paternal Aunt    Breast cancer Paternal Aunt    Hypertension Maternal Grandmother    Hypertension Maternal Grandfather    Hypertension Sister    Allergies  Allergen Reactions   Aspirin  Other (  See Comments)    GI intolerance    Nsaids     GI discomfort and gastritis   Tolmetin Other (See Comments)    GI discomfort and gastritis   Current Outpatient Medications on File Prior to Visit  Medication Sig Dispense Refill   acetaminophen  (TYLENOL ) 500 MG tablet Take 500 mg by mouth every 6 (six) hours as needed for moderate pain (pain score 4-6).     amLODipine  (NORVASC ) 5 MG tablet Take 1/2 tablet (2.5 mg) by mouth daily 45 tablet 3   B Complex-C (SUPER B COMPLEX PO) Take 1 tablet by mouth daily.     butalbital -acetaminophen -caffeine  (FIORICET) 50-325-40 MG tablet Take 1 tablet by mouth every 6 (six) hours as needed. 60 tablet 0   cholecalciferol  (VITAMIN D ) 25 MCG (1000 UNIT) tablet Take 1,000 Units by mouth daily.     diclofenac  sodium (VOLTAREN ) 1 % GEL APPLY 2 GRAMS TOPICALLY FOUR TIMES A DAY TO AFFECTED AREAS (Patient taking differently: Apply 2 g topically 2 (two) times daily.) 300 g 3   estradiol  (ESTRACE ) 0.1 MG/GM vaginal cream INSERT SMALL AMOUNT (1 CM) VAGINALLY EVERY OTHER DAY 42.5 g 1   famotidine  (PEPCID ) 40 MG tablet Take 40 mg by mouth daily as needed for heartburn or indigestion.     levothyroxine  (SYNTHROID ) 75 MCG tablet Take 1 tablet (75 mcg total) by mouth daily before breakfast. 90 tablet 0   linaclotide  (LINZESS ) 145 MCG CAPS capsule Take 1 capsule (145 mcg total) by mouth as needed. 90 capsule 3   LORazepam  (ATIVAN ) 0.5 MG tablet Take 1 tablet (0.5 mg total) by mouth 2 (two) times daily as needed for  anxiety. 30 tablet 0   metoprolol  tartrate (LOPRESSOR ) 25 MG tablet Take 0.5 tablets (12.5 mg total) by mouth 2 (two) times daily. 90 tablet 1   mometasone (NASONEX) 50 MCG/ACT nasal spray Place 2 sprays into the nose daily as needed (Rhinitis).     omeprazole  (PRILOSEC) 40 MG capsule Take 1 capsule (40 mg total) by mouth daily. 90 capsule 0   sodium chloride  1 g tablet Take 1 tablet (1 g total) by mouth 2 (two) times daily with a meal. 60 tablet 0   sucralfate  (CARAFATE ) 1 g tablet Take 1 g by mouth 4 (four) times daily as needed (Esophagitis).     No current facility-administered medications on file prior to visit.    Review of Systems     Objective:   Physical Exam Constitutional:      General: She is not in acute distress.    Appearance: Normal appearance. She is well-developed and normal weight. She is not ill-appearing or diaphoretic.  HENT:     Head: Normocephalic and atraumatic.  Eyes:     Conjunctiva/sclera: Conjunctivae normal.     Pupils: Pupils are equal, round, and reactive to light.  Neck:     Thyroid : No thyromegaly.     Vascular: No carotid bruit or JVD.     Comments: No change in thyroid  exam  Cardiovascular:     Rate and Rhythm: Normal rate and regular rhythm.     Heart sounds: Normal heart sounds.     No gallop.  Pulmonary:     Effort: Pulmonary effort is normal. No respiratory distress.     Breath sounds: Normal breath sounds. No wheezing or rales.  Abdominal:     General: There is no distension or abdominal bruit.     Palpations: Abdomen is soft.  Musculoskeletal:     Cervical  back: Normal range of motion and neck supple.     Right lower leg: No edema.     Left lower leg: No edema.     Comments: Slow gait with right hip stiffness /using cane   Lymphadenopathy:     Cervical: No cervical adenopathy.  Skin:    General: Skin is warm and dry.     Coloration: Skin is not pale.     Findings: No rash.  Neurological:     Mental Status: She is alert.      Coordination: Coordination normal.     Deep Tendon Reflexes: Reflexes are normal and symmetric. Reflexes normal.     Comments: Very mild hand tremor (intention)   Psychiatric:        Mood and Affect: Mood normal.           Assessment & Plan:   Problem List Items Addressed This Visit       Cardiovascular and Mediastinum   Hypertension, essential    bp in fair control at this time  BP Readings from Last 1 Encounters:  12/13/23 136/80   No changes needed Most recent labs reviewed  Disc lifstyle change with low sodium diet and exercise  Continues  Amlodipine  2.5 mg daily  Metoprolol  12.5 mg bid   Some mild pedal edema Lab today       Relevant Orders   TSH   Comprehensive metabolic panel with GFR     Endocrine   Hypothyroidism   More fatigued  TSH ordered  Takes levothyroxine  75 mcg daily   Has stopped CBD for now           Musculoskeletal and Integument   Degenerative joint disease (DJD) of lumbar spine   Considering injection currently  Worsened pain with hip fracture recovery       Closed right hip fracture (HCC)   Still recovering Some pain  Walker has caused sciatic nerve issues (has oa spine also) Working with PT Recently started using cane           Other   GAD (generalized anxiety disorder)   Fatigue - Primary   More lately  Off CBD Recovering from hip fracture - has anemia and in PT  Hypothyroid  On ppi  Labs ordered        Relevant Orders   Iron   CBC with Differential/Platelet   Vitamin B12   TSH   Comprehensive metabolic panel with GFR   Current use of proton pump inhibitor   More fatigued  B12 added to labs       Relevant Orders   Vitamin B12   Blood loss anemia   Still fatigued Had anemia from hip surgery   Lab today      Relevant Orders   Iron   CBC with Differential/Platelet   Ferritin

## 2023-12-13 NOTE — Assessment & Plan Note (Signed)
 Still fatigued Had anemia from hip surgery   Lab today

## 2023-12-13 NOTE — Therapy (Signed)
 OUTPATIENT PHYSICAL THERAPY LOWER EXTREMITY TREATMTENT/Physical Therapy Progress Note   Dates of reporting period  11/08/2023   to   12/13/2023    Patient Name: Sarah Harper MRN: [id number] DOB:07-09-1941, 83 y.o., female Today's Date: 12/13/2023  END OF SESSION:  PT End of Session - 12/13/23 1117     Visit Number 10    Number of Visits 24    Date for PT Re-Evaluation 01/31/24    Progress Note Due on Visit 20    PT Start Time 1100    PT Stop Time 1148    PT Time Calculation (min) 48 min    Equipment Utilized During Treatment Gait belt    Activity Tolerance Patient tolerated treatment well    Behavior During Therapy WFL for tasks assessed/performed                  Past Medical History:  Diagnosis Date   Anxiety    Blood loss anemia 09/13/2023   Cataract 1997   Colon cancer screening 02/05/2018   DDD (degenerative disc disease)    chronic back pain   ETD (eustachian tube dysfunction)    GERD (gastroesophageal reflux disease)    Hypertension 2022   IBS (irritable bowel syndrome)    Meniere's disease    Migraine    still gets visual aura from time to time   Mild cognitive impairment    Osteoporosis    Sleep apnea    CPAP   Stroke (HCC) 2022   Syncopal episodes    after work-up - possible seizures?   Thyroid  disease    hypothyroid   TIA (transient ischemic attack) 11/09/2020   Past Surgical History:  Procedure Laterality Date   APPENDECTOMY     CATARACT EXTRACTION W/PHACO Right 05/21/2019   Procedure: CATARACT EXTRACTION PHACO AND INTRAOCULAR LENS PLACEMENT (IOC) RIGHT panoptix lens  00:35.0  21.7%  7.61;  Surgeon: Annell Kidney, MD;  Location: [facility];  Service: Ophthalmology;  Laterality: Right;  sleep apnea requests early   CATARACT EXTRACTION W/PHACO Left 06/11/2019   Procedure: CATARACT EXTRACTION PHACO AND INTRAOCULAR LENS PLACEMENT (IOC) LEFT PANOPTIX TORIC LENS  00:50.0  20.3%  10.21;  Surgeon: Annell Kidney, MD;   Location: [facility];  Service: Ophthalmology;  Laterality: Left;  sleep apnea requests early   COLONOSCOPY     last 2012 - [facility]   ESOPHAGOGASTRODUODENOSCOPY     Multiple, last 01/07/2017 with Savary dilation to 18 mm   EYE SURGERY  August 2022   Cataracs removed   HEMORRHOID BANDING  2018   [facility]   INTRAMEDULLARY (IM) NAIL INTERTROCHANTERIC Right 09/06/2023   Procedure: INTRAMEDULLARY (IM) NAIL INTERTROCHANTERIC;  Surgeon: Hardy Lia, MD;  Location: [facility];  Service: Orthopedics;  Laterality: Right;   LUMBAR DISC SURGERY  1984   L5    Patient Active Problem List   Diagnosis Date Noted   Skin lesion of right leg 10/18/2023   Hyponatremia 10/18/2023   Blood loss anemia 09/13/2023   Closed right hip fracture (HCC) 09/05/2023   Abnormal urinalysis 09/02/2023   Acute cystitis 08/21/2023   Current use of proton pump inhibitor 03/13/2023   Pain in joint, lower leg 01/30/2023   Senile purpura (HCC) 12/26/2022   Skin tear of right lower leg without complication 12/26/2022   Dysuria 10/21/2021   Elevated alkaline phosphatase level 03/11/2021   Palpitations 11/10/2020   Migraine    Hypertension, essential    H/O: CVA (cerebrovascular accident) 11/08/2020   Headache 09/26/2019  Fall at home 06/02/2019   Colon cancer screening 02/05/2018   Degenerative joint disease (DJD) of lumbar spine 05/02/2017   Internal hemorrhoids 05/02/2017   Generalized osteoarthritis of hand 12/11/2016   GAD (generalized anxiety disorder) 12/11/2016   Estrogen deficiency 05/11/2016   Encounter for screening mammogram for breast cancer 05/11/2016   OSA (obstructive sleep apnea) 03/06/2016   Fatigue 01/25/2016   Left knee pain 10/18/2015   Snoring 10/18/2015   Routine general medical examination at a health care facility 02/28/2015   Encounter for Medicare annual wellness exam 02/06/2013   Hyperlipidemia 08/27/2012   Irregular heart beat 08/05/2012   Encounter for gynecological examination  12/20/2010   Osteopenia 12/17/2009   BACK PAIN, LUMBAR 07/02/2009   IRRITABLE BOWEL SYNDROME 04/02/2009   Asymptomatic postmenopausal status 12/10/2008   Hypothyroidism 12/05/2007   Meniere's disease 12/05/2007   GERD 12/05/2007    PCP: Dr. Deri Fleet  REFERRING PROVIDER: Dr. Hardy Lia  REFERRING DIAG: Right intertrochanteric fracture; Piriformis syndrome  THERAPY DIAG:  Abnormality of gait and mobility  Difficulty in walking, not elsewhere classified  Muscle weakness (generalized)  Other abnormalities of gait and mobility  Pain in right hip  Other lack of coordination  Rationale for Evaluation and Treatment: Rehabilitation  ONSET DATE: 09/05/2023  SUBJECTIVE:   SUBJECTIVE STATEMENT:    Patient reports doing well and states MD said her issue was arthritis and going to help her get set up for an injection into R Hip.   PERTINENT HISTORY: Per hospital H&P from 09/05/2023: Sarah Harper is a 83 year old female with past medical history significant for HTN, hypothyroidism, GERD, osteopenia, dyslipidemia, OSA who presented to [facility] ED on 1/15 fall mechanical fall at home sustaining injury to her right hip with acute pain.  Unable to ambulate.  Workup in the ED notable for acute impacted, angulated, displaced intertrochanteric fracture proximal right femur.  Orthopedics was consulted with plan for operative management on 09/06/2023.  Patient underwent intramedullary nailing of the right hip 09/06/2023 by Dr. Guyann Leitz.  She tested positive for COVID on 09/08/2023 and completed isolation and was discharge to skilled nursing facility.  PAIN:  Are you having pain? Yes: NPRS scale: current 1-2/10; best 0/10 and sleep pretty; up to 8/10 at worst Pain location: R glute region Pain description: ache but sharp at times Aggravating factors: walking on unlevel surfaces Relieving factors: lying down; medication.   PRECAUTIONS: Fall  RED FLAGS: None   WEIGHT BEARING RESTRICTIONS:  No  FALLS:  Has patient fallen in last 6 months? Yes. Number of falls 1  LIVING ENVIRONMENT: Lives with: lives with their spouse Lives in: House/apartment Stairs: Yes: Internal: 2 steps; on right going up and grab bar on left. Has following equipment at home: Single point cane, Walker - 2 wheeled, Grab bars, and elevated toilet  OCCUPATION: Retired -  PLOF: Independent  PATIENT GOALS: I want to walk without my walker  NEXT MD VISIT: 12/12/2023  OBJECTIVE:  Note: Objective measures were completed at Evaluation unless otherwise noted.  DIAGNOSTIC FINDINGS: CLINICAL DATA:  Fall onto right hip.   EXAM: DG HIP (WITH OR WITHOUT PELVIS) 2-3V RIGHT   COMPARISON:  None Available.   FINDINGS: There is an acute impacted intertrochanteric fracture of the proximal right femur with fracture margins extending through the greater and lesser trochanters. There is approximately 1.8 cm of lateral displacement of the distal segment with apex varus angulation. The right femoral head is seated within the acetabulum. Evaluation of the sacrum is limited  due to overlying bowel-gas. The sacroiliac joints and pubic symphysis otherwise appear anatomically aligned.   IMPRESSION: Acute impacted, angulated, and displaced intertrochanteric fracture of the proximal right femur.     Electronically Signed   By: Mannie Seek M.D.   On: 09/05/2023 08:21  PATIENT SURVEYS:  LEFS 22/80  COGNITION: Overall cognitive status: Within functional limits for tasks assessed     SENSATION: WFL  EDEMA:  Wearing compression stocking on left    PALPATION: (+) tenderness along right posterior gluteal region  LOWER EXTREMITY ROM:  Active ROM Right eval Left eval  Hip flexion    Hip extension    Hip abduction    Hip adduction    Hip internal rotation    Hip external rotation    Knee flexion    Knee extension    Ankle dorsiflexion    Ankle plantarflexion    Ankle inversion    Ankle  eversion     (Blank rows = not tested)  LOWER EXTREMITY MMT:  MMT Right eval Left eval  Hip flexion 3+ *pain 4  Hip extension 3+ 4  Hip abduction 3+ *pain 4  Hip adduction    Hip internal rotation 3+ 4  Hip external rotation 3+ 4  Knee flexion 4 4  Knee extension 4 4  Ankle dorsiflexion 4 4  Ankle plantarflexion    Ankle inversion    Ankle eversion     (Blank rows = not tested)  LOWER EXTREMITY SPECIAL TESTS:  To be assessed next visit  FUNCTIONAL TESTS:  5 times sit to stand: 13.98 sec with heavy BUE Support - unable to perform without UE support despite multiple attempts Timed up and go (TUG): 28.14 sec with RW 6 minute walk test: to be assessed visit #2 10 meter walk test: 15.22 and 15.77 sec with RW= 0.65 m/s Berg Balance Scale: To be assessed visit #2  GAIT: Distance walked: approx 100 feet Assistive device utilized: Walker - 2 wheeled Level of assistance: CGA Comments: decreased step length, Increased UE support on walker, antalgic gait                                                                                                                                TREATMENT DATE: 12/13/23     Physical therapy treatment session today consisted of completing assessment of goals and administration of testing as demonstrated and documented in flow sheet, treatment, and goals section of this note. Addition treatments may be found below.   6 Min Walk Test:  Instructed patient to ambulate as quickly and as safely as possible for 6 minutes using LRAD. Patient was allowed to take standing rest breaks without stopping the test, but if the patient required a sitting rest break the clock would be stopped and the test would be over.  Results: 880 feet (268 meters, Avg speed 0.46m/s) using a hurrycane with SBA. Results indicate that the patient has reduced endurance  with ambulation compared to age matched norms.  Age Matched Norms: 29-69 yo M: 70 F: 55, 39-79 yo M: 33 F: 471,  88-89 yo M: 417 F: 392 MDC: 58.21 meters (190.98 feet) or 50 meters (ANPTA Core Set of Outcome Measures for Adults with Neurologic Conditions, 2018)   PT instructed pt in TUG: 12/13/2023- 16.15 sec avg with Hurrycane (average of 3 trials; >13.5 sec indicates increased fall risk)   10 Meter Walk Test: Patient instructed to walk 10 meters (32.8 ft) as quickly and as safely as possible at their normal speed x2 and at a fast speed x2. Time measured from 2 meter mark to 8 meter mark to accommodate ramp-up and ramp-down.  Normal speed 1: 0.74 m/s using hurrycane m/s Normal speed 2: 0.74 m/s Average Normal speed: 0.74 m/s  Cut off scores: <0.4 m/s = household Ambulator, 0.4-0.8 m/s = limited community Ambulator, >0.8 m/s = community Ambulator, >1.2 m/s = crossing a street, <1.0 = increased fall risk MCID 0.05 m/s (small), 0.13 m/s (moderate), 0.06 m/s (significant)  (ANPTA Core Set of Outcome Measures for Adults with Neurologic Conditions, 2018)    Pt performed 5 time sit<>stand (5xSTS):  12/13/2023- Still unable to stand without UE Support yet did progress to only using 1 UE support today in 19.03 sec.  sec (>15 sec indicates increased fall risk)    THERAPEUTIC ACTIVITIES:  Step tap onto 6" step 2 x 10 reps with BUE Support             PATIENT EDUCATION:  Education details: Exercise technique Person educated: Patient and Spouse Education method: Explanation, Demonstration, Tactile cues, and Verbal cues Education comprehension: verbalized understanding and returned demonstration  HOME EXERCISE PROGRAM: Access Code: [facility] URL: [email or web address] Date: 11/20/2023 Prepared by: Ferrell Hu  Exercises - Supine Bridge  - 3 x weekly - 3 sets - 10 reps - Clamshell with Resistance  - 3 x weekly - 3 sets - 10 reps - Standing Diagonal Hip Extension and External Rotation  - 3 x weekly - 3 sets - 10 reps - Standing Hip Abduction with Counter Support  - 3 x weekly -  3 sets - 10 reps - Standing March with Counter Support  - 3 x weekly - 3 sets - 10 reps     Access Code: [email or web address] URL: [email or web address] Date: 11/13/2023 Prepared by: Ferrell Hu  Exercises - Supine Piriformis Stretch with Foot on Ground  - 1 x daily - 3 sets - 30 sec hold - Seated Piriformis Stretch with Trunk Bend  - 1 x daily - 3 sets - 30 sec hold - Seated Piriformis Release With [employer]  - 1 x daily - 3 sets - 10 reps  ASSESSMENT:  CLINICAL IMPRESSION: Patient presents with good motivation for today's progress visit. Many goals were reassessed today with improving results. Patient presents with improving Right hip pain and overall mobility- Improved TUG, 10 MWT, and 6 min walk test. She is still having some weakness and pain and will continue to benefit from continued PT services and her condition has the potential to improve in response to therapy. Maximum improvement is yet to be obtained. The anticipated improvement is attainable and reasonable in a generally predictable time.  Patient will benefit from skilled PT services to address her right hip pain, muscle weakness, and impaired mobility to restore her to her previous level of independence with improved quality of life and decreased risk of falling.    OBJECTIVE IMPAIRMENTS: Abnormal gait, decreased  activity tolerance, decreased balance, decreased coordination, decreased endurance, decreased mobility, difficulty walking, decreased strength, and pain.   ACTIVITY LIMITATIONS: carrying, lifting, bending, standing, squatting, stairs, and transfers  PARTICIPATION LIMITATIONS: meal prep, cleaning, laundry, shopping, community activity, and yard work  PERSONAL FACTORS: Age and 1-2 comorbidities: anxiety, Osteoporosis  are also affecting patient's functional outcome.   REHAB POTENTIAL: Good  CLINICAL DECISION MAKING: Evolving/moderate complexity  EVALUATION COMPLEXITY: Moderate   GOALS: Goals  reviewed with patient? Yes  SHORT TERM GOALS: Target date: 12/20/2023 Pt will be independent with HEP in order to improve strength and balance in order to decrease fall risk and improve function at home and work.  Baseline: EVAL: No formal HEP in place; 12/13/2023- Patient reports compliant with HEP to date- States no questions or concerns Goal status: MET   LONG TERM GOALS: Target date: 02/01/2024  Pt will increase LEFS by at least 9 points in order to demonstrate significant improvement in lower extremity function. Baseline: EVAL: 22/80 Goal status: INITIAL  2.  Pt will decrease worst pain as reported on NPRS by at least 3 points in order to demonstrate clinically significant reduction in ankle/foot pain.  Baseline: EVAL= 8/10 right hip pain at worst; 12/13/2023= Right hip 4/10 at worst  Goal status: PROGRESSING  3.  Pt will decrease 5TSTS by at least 3 seconds in order to demonstrate clinically significant improvement in LE strength. Baseline: EVAL 13.98 sec with heavy BUE Support - unable to perform without UE support despite multiple attempts; 12/13/2023- Still unable to stand without UE Support yet did progress to only using 1 UE support today in 19.03 sec.  Goal status: PROGRESSING  4.  Pt will decrease TUG to below 14 seconds/decrease in order to demonstrate decreased fall risk. Baseline: EVAL=28.14 sec with RW; 12/13/2023- 16.15 sec with Hurrycane Goal status: PROGRESSING  5.  Patient will improve gait speed by 0.2 m/s for improved functional mobility and decreased risk of falling. Baseline: EVAL=0.65 m/s with RW; 12/13/2023= 0.74 m/s using hurrycane Goal status: INITIAL  6.  Pt will improve BERG by at least 3 points in order to demonstrate clinically significant improvement in balance.  Baseline: EVAL- To be assessed visit #2; 11/13/2023= 45/56 Goal status: INITIAL  7. Pt will increase (able to complete the test without stopping) and > 800 feet in order to demonstrate  clinically significant improvement in cardiopulmonary endurance and community ambulation   Baseline: 11/20/2023= 450 feet in 4 min (Stopped due to pain); 12/13/2023= 880 with cane-with 2/10 right hip pain  Goal status: PROGRESSING   PLAN:  PT FREQUENCY: 1-2x/week  PT DURATION: 12 weeks  PLANNED INTERVENTIONS: [id number]- PT Re-evaluation, 97110-Therapeutic exercises, 97530- Therapeutic activity, 97112- Neuromuscular re-education, 97535- Self Care, [id number]- Manual therapy, [id number]- Gait training, [phone]- Orthotic Fit/training, [phone]- Canalith repositioning, [phone]- Electrical stimulation (manual), Patient/Family education, Balance training, Stair training, Taping, Dry Needling, Joint mobilization, Spinal mobilization, Scar mobilization, Compression bandaging, Vestibular training, DME instructions, Cryotherapy, and Moist heat  PLAN FOR NEXT SESSION:  progress  strengthening and balance activities. Mobility with education in gait training and use of appropriate AD. Progress HEP. Continue to progress with standing weight bearing activities.    Ossie Blend, PT Physical Therapist - [facility]  12/13/2023, 3:52 PM

## 2023-12-13 NOTE — Assessment & Plan Note (Signed)
 Considering injection currently  Worsened pain with hip fracture recovery

## 2023-12-13 NOTE — Patient Instructions (Signed)
 Labs today for thyroid  and fatigue and anemia   Keep eating regularly  Get protein with every meal    We will reach out with results and plan    Take care of yourself

## 2023-12-13 NOTE — Assessment & Plan Note (Signed)
  bp in fair control at this time  BP Readings from Last 1 Encounters:  12/13/23 136/80   No changes needed Most recent labs reviewed  Disc lifstyle change with low sodium diet and exercise  Continues  Amlodipine  2.5 mg daily  Metoprolol  12.5 mg bid   Some mild pedal edema Lab today

## 2023-12-13 NOTE — Assessment & Plan Note (Signed)
 Still recovering Some pain  Sarah Harper has caused sciatic nerve issues (has oa spine also) Working with PT Recently started using cane

## 2023-12-13 NOTE — Assessment & Plan Note (Signed)
 More fatigued  B12 added to labs

## 2023-12-18 ENCOUNTER — Ambulatory Visit

## 2023-12-18 DIAGNOSIS — R262 Difficulty in walking, not elsewhere classified: Secondary | ICD-10-CM

## 2023-12-18 DIAGNOSIS — M6281 Muscle weakness (generalized): Secondary | ICD-10-CM

## 2023-12-18 DIAGNOSIS — R2689 Other abnormalities of gait and mobility: Secondary | ICD-10-CM

## 2023-12-18 DIAGNOSIS — M25551 Pain in right hip: Secondary | ICD-10-CM

## 2023-12-18 DIAGNOSIS — R269 Unspecified abnormalities of gait and mobility: Secondary | ICD-10-CM

## 2023-12-18 DIAGNOSIS — R278 Other lack of coordination: Secondary | ICD-10-CM

## 2023-12-18 NOTE — Therapy (Signed)
 OUTPATIENT PHYSICAL THERAPY LOWER EXTREMITY TREATMTENT   Patient Name: Sarah Harper MRN: [id number] DOB:1941/05/20, 83 y.o., female Today's Date: 12/18/2023  END OF SESSION:  PT End of Session - 12/18/23 1021     Visit Number 11    Number of Visits 24    Date for PT Re-Evaluation 01/31/24    Progress Note Due on Visit 20    PT Start Time 1018    PT Stop Time 1059    PT Time Calculation (min) 41 min    Equipment Utilized During Treatment Gait belt    Activity Tolerance Patient tolerated treatment well    Behavior During Therapy WFL for tasks assessed/performed                  Past Medical History:  Diagnosis Date   Anxiety    Blood loss anemia 09/13/2023   Cataract 1997   Colon cancer screening 02/05/2018   DDD (degenerative disc disease)    chronic back pain   ETD (eustachian tube dysfunction)    GERD (gastroesophageal reflux disease)    Hypertension 2022   IBS (irritable bowel syndrome)    Meniere's disease    Migraine    still gets visual aura from time to time   Mild cognitive impairment    Osteoporosis    Sleep apnea    CPAP   Stroke (HCC) 2022   Syncopal episodes    after work-up - possible seizures?   Thyroid  disease    hypothyroid   TIA (transient ischemic attack) 11/09/2020   Past Surgical History:  Procedure Laterality Date   APPENDECTOMY     CATARACT EXTRACTION W/PHACO Right 05/21/2019   Procedure: CATARACT EXTRACTION PHACO AND INTRAOCULAR LENS PLACEMENT (IOC) RIGHT panoptix lens  00:35.0  21.7%  7.61;  Surgeon: Annell Kidney, MD;  Location: [facility];  Service: Ophthalmology;  Laterality: Right;  sleep apnea requests early   CATARACT EXTRACTION W/PHACO Left 06/11/2019   Procedure: CATARACT EXTRACTION PHACO AND INTRAOCULAR LENS PLACEMENT (IOC) LEFT PANOPTIX TORIC LENS  00:50.0  20.3%  10.21;  Surgeon: Annell Kidney, MD;  Location: [facility];  Service: Ophthalmology;  Laterality: Left;  sleep  apnea requests early   COLONOSCOPY     last 2012 - [facility]   ESOPHAGOGASTRODUODENOSCOPY     Multiple, last 01/07/2017 with Savary dilation to 18 mm   EYE SURGERY  August 2022   Cataracs removed   HEMORRHOID BANDING  2018   [facility]   INTRAMEDULLARY (IM) NAIL INTERTROCHANTERIC Right 09/06/2023   Procedure: INTRAMEDULLARY (IM) NAIL INTERTROCHANTERIC;  Surgeon: Hardy Lia, MD;  Location: [facility];  Service: Orthopedics;  Laterality: Right;   LUMBAR DISC SURGERY  1984   L5    Patient Active Problem List   Diagnosis Date Noted   Skin lesion of right leg 10/18/2023   Hyponatremia 10/18/2023   Blood loss anemia 09/13/2023   Closed right hip fracture (HCC) 09/05/2023   Abnormal urinalysis 09/02/2023   Acute cystitis 08/21/2023   Current use of proton pump inhibitor 03/13/2023   Pain in joint, lower leg 01/30/2023   Senile purpura (HCC) 12/26/2022   Skin tear of right lower leg without complication 12/26/2022   Dysuria 10/21/2021   Elevated alkaline phosphatase level 03/11/2021   Palpitations 11/10/2020   Migraine    Hypertension, essential    H/O: CVA (cerebrovascular accident) 11/08/2020   Headache 09/26/2019   Fall at home 06/02/2019   Colon cancer screening 02/05/2018   Degenerative joint disease (DJD) of  lumbar spine 05/02/2017   Internal hemorrhoids 05/02/2017   Generalized osteoarthritis of hand 12/11/2016   GAD (generalized anxiety disorder) 12/11/2016   Estrogen deficiency 05/11/2016   Encounter for screening mammogram for breast cancer 05/11/2016   OSA (obstructive sleep apnea) 03/06/2016   Fatigue 01/25/2016   Left knee pain 10/18/2015   Snoring 10/18/2015   Routine general medical examination at a health care facility 02/28/2015   Encounter for Medicare annual wellness exam 02/06/2013   Hyperlipidemia 08/27/2012   Irregular heart beat 08/05/2012   Encounter for gynecological examination 12/20/2010   Osteopenia 12/17/2009   BACK PAIN, LUMBAR 07/02/2009   IRRITABLE  BOWEL SYNDROME 04/02/2009   Asymptomatic postmenopausal status 12/10/2008   Hypothyroidism 12/05/2007   Meniere's disease 12/05/2007   GERD 12/05/2007    PCP: Dr. Deri Fleet  REFERRING PROVIDER: Dr. Hardy Lia  REFERRING DIAG: Right intertrochanteric fracture; Piriformis syndrome  THERAPY DIAG:  Abnormality of gait and mobility  Difficulty in walking, not elsewhere classified  Muscle weakness (generalized)  Other abnormalities of gait and mobility  Pain in right hip  Other lack of coordination  Rationale for Evaluation and Treatment: Rehabilitation  ONSET DATE: 09/05/2023  SUBJECTIVE:   SUBJECTIVE STATEMENT:    Patient reports She has not heard back about the injection.    PERTINENT HISTORY: Per hospital H&P from 09/05/2023: Sarah Harper is a 83 year old female with past medical history significant for HTN, hypothyroidism, GERD, osteopenia, dyslipidemia, OSA who presented to [facility] ED on 1/15 fall mechanical fall at home sustaining injury to her right hip with acute pain.  Unable to ambulate.  Workup in the ED notable for acute impacted, angulated, displaced intertrochanteric fracture proximal right femur.  Orthopedics was consulted with plan for operative management on 09/06/2023.  Patient underwent intramedullary nailing of the right hip 09/06/2023 by Dr. Guyann Leitz.  She tested positive for COVID on 09/08/2023 and completed isolation and was discharge to skilled nursing facility.  PAIN:  Are you having pain? Yes: NPRS scale: current 1-2/10; best 0/10 and sleep pretty; up to 8/10 at worst Pain location: R glute region Pain description: ache but sharp at times Aggravating factors: walking on unlevel surfaces Relieving factors: lying down; medication.   PRECAUTIONS: Fall  RED FLAGS: None   WEIGHT BEARING RESTRICTIONS: No  FALLS:  Has patient fallen in last 6 months? Yes. Number of falls 1  LIVING ENVIRONMENT: Lives with: lives with their spouse Lives in:  House/apartment Stairs: Yes: Internal: 2 steps; on right going up and grab bar on left. Has following equipment at home: Single point cane, Walker - 2 wheeled, Grab bars, and elevated toilet  OCCUPATION: Retired -  PLOF: Independent  PATIENT GOALS: I want to walk without my walker  NEXT MD VISIT: 12/12/2023  OBJECTIVE:  Note: Objective measures were completed at Evaluation unless otherwise noted.  DIAGNOSTIC FINDINGS: CLINICAL DATA:  Fall onto right hip.   EXAM: DG HIP (WITH OR WITHOUT PELVIS) 2-3V RIGHT   COMPARISON:  None Available.   FINDINGS: There is an acute impacted intertrochanteric fracture of the proximal right femur with fracture margins extending through the greater and lesser trochanters. There is approximately 1.8 cm of lateral displacement of the distal segment with apex varus angulation. The right femoral head is seated within the acetabulum. Evaluation of the sacrum is limited due to overlying bowel-gas. The sacroiliac joints and pubic symphysis otherwise appear anatomically aligned.   IMPRESSION: Acute impacted, angulated, and displaced intertrochanteric fracture of the proximal right femur.  Electronically Signed   By: Mannie Seek M.D.   On: 09/05/2023 08:21  PATIENT SURVEYS:  LEFS 22/80  COGNITION: Overall cognitive status: Within functional limits for tasks assessed     SENSATION: WFL  EDEMA:  Wearing compression stocking on left    PALPATION: (+) tenderness along right posterior gluteal region  LOWER EXTREMITY ROM:  Active ROM Right eval Left eval  Hip flexion    Hip extension    Hip abduction    Hip adduction    Hip internal rotation    Hip external rotation    Knee flexion    Knee extension    Ankle dorsiflexion    Ankle plantarflexion    Ankle inversion    Ankle eversion     (Blank rows = not tested)  LOWER EXTREMITY MMT:  MMT Right eval Left eval  Hip flexion 3+ *pain 4  Hip extension 3+ 4  Hip  abduction 3+ *pain 4  Hip adduction    Hip internal rotation 3+ 4  Hip external rotation 3+ 4  Knee flexion 4 4  Knee extension 4 4  Ankle dorsiflexion 4 4  Ankle plantarflexion    Ankle inversion    Ankle eversion     (Blank rows = not tested)  LOWER EXTREMITY SPECIAL TESTS:  To be assessed next visit  FUNCTIONAL TESTS:  5 times sit to stand: 13.98 sec with heavy BUE Support - unable to perform without UE support despite multiple attempts Timed up and go (TUG): 28.14 sec with RW 6 minute walk test: to be assessed visit #2 10 meter walk test: 15.22 and 15.77 sec with RW= 0.65 m/s Berg Balance Scale: To be assessed visit #2  GAIT: Distance walked: approx 100 feet Assistive device utilized: Walker - 2 wheeled Level of assistance: CGA Comments: decreased step length, Increased UE support on walker, antalgic gait                                                                                                                                TREATMENT DATE: 12/18/23      Neuromuscular Re-Education: neuromuscular reeducation of movement, balance, coordination, kinesthetic sense, posture and proprioception for sitting and/or standing:    - Dynamic step tap x 20 reps with 1UE support  - Forward/backward walking (along 10 feet line)- counting steps (6-8 steps forward and 10-16 steps backward)   -Dynamic lateral side steps up/over 1/2 foam x 15 reps (min to no UE support)   - Activity Description: at steps- arranged pods to dynamic work left LE while static standing on RLE.  Activity Setting:  The Blaze Pod Random setting was chosen to enhance cognitive processing and agility, providing an unpredictable environment to simulate real-world scenarios, and fostering quick reactions and adaptability.   Number of Pods:  3 Cycles/Sets:  8 Duration (Time or Hit Count):  30 sec    Therapeutic Activities: dynamic therapeutic activities designed to achieve improved  functional  performance:  -Sit to stand x 12 (1 UE support) and on last 3 - eccentric control (5 sec- slow descent)   - Dynamic step up (1 UE support) x 15 reps ea LE  - Standing Hip ext with (gluteal squeeze) x 12 reps alt LE  -Step up onto "the step"  alt LE then stepping off- Initially using HHA when stepping up on right- progressed to no UE support.          PATIENT EDUCATION:  Education details: Exercise technique Person educated: Patient and Spouse Education method: Explanation, Demonstration, Tactile cues, and Verbal cues Education comprehension: verbalized understanding and returned demonstration  HOME EXERCISE PROGRAM: Access Code: [facility] URL: [email or web address] Date: 11/20/2023 Prepared by: Ferrell Hu  Exercises - Supine Bridge  - 3 x weekly - 3 sets - 10 reps - Clamshell with Resistance  - 3 x weekly - 3 sets - 10 reps - Standing Diagonal Hip Extension and External Rotation  - 3 x weekly - 3 sets - 10 reps - Standing Hip Abduction with Counter Support  - 3 x weekly - 3 sets - 10 reps - Standing March with Counter Support  - 3 x weekly - 3 sets - 10 reps     Access Code: [email or web address] URL: [email or web address] Date: 11/13/2023 Prepared by: Ferrell Hu  Exercises - Supine Piriformis Stretch with Foot on Ground  - 1 x daily - 3 sets - 30 sec hold - Seated Piriformis Stretch with Trunk Bend  - 1 x daily - 3 sets - 30 sec hold - Seated Piriformis Release With [employer]  - 1 x daily - 3 sets - 10 reps  ASSESSMENT:  CLINICAL IMPRESSION: Patient continues to respond well to PT and demo improving confidence with practice today.   She exhibited some fear of increased weight bearing or step ups on RLE yet able to demo progress with all with practice. - She was pleased with ability to use LE more without report of any worsening pain today. Patient will benefit from skilled PT services to address her right hip pain, muscle weakness,  and impaired mobility to restore her to her previous level of independence with improved quality of life and decreased risk of falling.    OBJECTIVE IMPAIRMENTS: Abnormal gait, decreased activity tolerance, decreased balance, decreased coordination, decreased endurance, decreased mobility, difficulty walking, decreased strength, and pain.   ACTIVITY LIMITATIONS: carrying, lifting, bending, standing, squatting, stairs, and transfers  PARTICIPATION LIMITATIONS: meal prep, cleaning, laundry, shopping, community activity, and yard work  PERSONAL FACTORS: Age and 1-2 comorbidities: anxiety, Osteoporosis  are also affecting patient's functional outcome.   REHAB POTENTIAL: Good  CLINICAL DECISION MAKING: Evolving/moderate complexity  EVALUATION COMPLEXITY: Moderate   GOALS: Goals reviewed with patient? Yes  SHORT TERM GOALS: Target date: 12/20/2023 Pt will be independent with HEP in order to improve strength and balance in order to decrease fall risk and improve function at home and work.  Baseline: EVAL: No formal HEP in place; 12/13/2023- Patient reports compliant with HEP to date- States no questions or concerns Goal status: MET   LONG TERM GOALS: Target date: 02/01/2024  Pt will increase LEFS by at least 9 points in order to demonstrate significant improvement in lower extremity function. Baseline: EVAL: 22/80 Goal status: INITIAL  2.  Pt will decrease worst pain as reported on NPRS by at least 3 points in order to demonstrate clinically significant reduction in ankle/foot pain.  Baseline: EVAL= 8/10 right hip pain at worst; 12/13/2023=  Right hip 4/10 at worst  Goal status: PROGRESSING  3.  Pt will decrease 5TSTS by at least 3 seconds in order to demonstrate clinically significant improvement in LE strength. Baseline: EVAL 13.98 sec with heavy BUE Support - unable to perform without UE support despite multiple attempts; 12/13/2023- Still unable to stand without UE Support yet did  progress to only using 1 UE support today in 19.03 sec.  Goal status: PROGRESSING  4.  Pt will decrease TUG to below 14 seconds/decrease in order to demonstrate decreased fall risk. Baseline: EVAL=28.14 sec with RW; 12/13/2023- 16.15 sec with Hurrycane Goal status: PROGRESSING  5.  Patient will improve gait speed by 0.2 m/s for improved functional mobility and decreased risk of falling. Baseline: EVAL=0.65 m/s with RW; 12/13/2023= 0.74 m/s using hurrycane Goal status: INITIAL  6.  Pt will improve BERG by at least 3 points in order to demonstrate clinically significant improvement in balance.  Baseline: EVAL- To be assessed visit #2; 11/13/2023= 45/56 Goal status: INITIAL  7. Pt will increase (able to complete the test without stopping) and > 800 feet in order to demonstrate clinically significant improvement in cardiopulmonary endurance and community ambulation   Baseline: 11/20/2023= 450 feet in 4 min (Stopped due to pain); 12/13/2023= 880 with cane-with 2/10 right hip pain  Goal status: PROGRESSING   PLAN:  PT FREQUENCY: 1-2x/week  PT DURATION: 12 weeks  PLANNED INTERVENTIONS: 97164- PT Re-evaluation, 97110-Therapeutic exercises, 97530- Therapeutic activity, 97112- Neuromuscular re-education, 97535- Self Care, 16109- Manual therapy, [id number]- Gait training, [phone]- Orthotic Fit/training, [phone]- Canalith repositioning, [phone]- Electrical stimulation (manual), Patient/Family education, Balance training, Stair training, Taping, Dry Needling, Joint mobilization, Spinal mobilization, Scar mobilization, Compression bandaging, Vestibular training, DME instructions, Cryotherapy, and Moist heat  PLAN FOR NEXT SESSION:  progress  strengthening and balance activities. Mobility with education in gait training and use of appropriate AD. Progress HEP. Continue to progress with standing weight bearing activities.    Ossie Blend, PT Physical Therapist - [facility]  12/18/2023, 11:55 AM

## 2023-12-20 ENCOUNTER — Ambulatory Visit: Attending: Orthopedic Surgery

## 2023-12-20 DIAGNOSIS — R278 Other lack of coordination: Secondary | ICD-10-CM | POA: Diagnosis present

## 2023-12-20 DIAGNOSIS — R269 Unspecified abnormalities of gait and mobility: Secondary | ICD-10-CM | POA: Diagnosis present

## 2023-12-20 DIAGNOSIS — M25551 Pain in right hip: Secondary | ICD-10-CM | POA: Diagnosis present

## 2023-12-20 DIAGNOSIS — R262 Difficulty in walking, not elsewhere classified: Secondary | ICD-10-CM | POA: Insufficient documentation

## 2023-12-20 DIAGNOSIS — M6281 Muscle weakness (generalized): Secondary | ICD-10-CM | POA: Insufficient documentation

## 2023-12-20 DIAGNOSIS — R2689 Other abnormalities of gait and mobility: Secondary | ICD-10-CM | POA: Insufficient documentation

## 2023-12-20 NOTE — Therapy (Signed)
 OUTPATIENT PHYSICAL THERAPY LOWER EXTREMITY TREATMTENT   Patient Name: Sarah Harper MRN: [id number] DOB:[birth year], 83 y.o., female Today's Date: 12/20/2023  END OF SESSION:  PT End of Session - 12/20/23 1013     Visit Number 12    Number of Visits 24    Date for PT Re-Evaluation 01/31/24    Progress Note Due on Visit 20    PT Start Time 1014    PT Stop Time 1058    PT Time Calculation (min) 44 min    Equipment Utilized During Treatment Gait belt    Activity Tolerance Patient tolerated treatment well    Behavior During Therapy WFL for tasks assessed/performed                   Past Medical History:  Diagnosis Date   Anxiety    Blood loss anemia 09/13/2023   Cataract 1997   Colon cancer screening 02/05/2018   DDD (degenerative disc disease)    chronic back pain   ETD (eustachian tube dysfunction)    GERD (gastroesophageal reflux disease)    Hypertension 2022   IBS (irritable bowel syndrome)    Meniere's disease    Migraine    still gets visual aura from time to time   Mild cognitive impairment    Osteoporosis    Sleep apnea    CPAP   Stroke (HCC) 2022   Syncopal episodes    after work-up - possible seizures?   Thyroid  disease    hypothyroid   TIA (transient ischemic attack) 11/09/2020   Past Surgical History:  Procedure Laterality Date   APPENDECTOMY     CATARACT EXTRACTION W/PHACO Right 05/21/2019   Procedure: CATARACT EXTRACTION PHACO AND INTRAOCULAR LENS PLACEMENT (IOC) RIGHT panoptix lens  00:35.0  21.7%  7.61;  Surgeon: Annell Kidney, MD;  Location: [facility];  Service: Ophthalmology;  Laterality: Right;  sleep apnea requests early   CATARACT EXTRACTION W/PHACO Left 06/11/2019   Procedure: CATARACT EXTRACTION PHACO AND INTRAOCULAR LENS PLACEMENT (IOC) LEFT PANOPTIX TORIC LENS  00:50.0  20.3%  10.21;  Surgeon: Annell Kidney, MD;  Location: [facility];  Service: Ophthalmology;  Laterality: Left;  sleep  apnea requests early   COLONOSCOPY     last 2012 - [facility]   ESOPHAGOGASTRODUODENOSCOPY     Multiple, last 01/07/2017 with Savary dilation to 18 mm   EYE SURGERY  August 2022   Cataracs removed   HEMORRHOID BANDING  2018   [facility]   INTRAMEDULLARY (IM) NAIL INTERTROCHANTERIC Right 09/06/2023   Procedure: INTRAMEDULLARY (IM) NAIL INTERTROCHANTERIC;  Surgeon: Hardy Lia, MD;  Location: [facility];  Service: Orthopedics;  Laterality: Right;   LUMBAR DISC SURGERY  1984   L5    Patient Active Problem List   Diagnosis Date Noted   Skin lesion of right leg 10/18/2023   Hyponatremia 10/18/2023   Blood loss anemia 09/13/2023   Closed right hip fracture (HCC) 09/05/2023   Abnormal urinalysis 09/02/2023   Acute cystitis 08/21/2023   Current use of proton pump inhibitor 03/13/2023   Pain in joint, lower leg 01/30/2023   Senile purpura (HCC) 12/26/2022   Skin tear of right lower leg without complication 12/26/2022   Dysuria 10/21/2021   Elevated alkaline phosphatase level 03/11/2021   Palpitations 11/10/2020   Migraine    Hypertension, essential    H/O: CVA (cerebrovascular accident) 11/08/2020   Headache 09/26/2019   Fall at home 06/02/2019   Colon cancer screening 02/05/2018   Degenerative joint disease (DJD)  of lumbar spine 05/02/2017   Internal hemorrhoids 05/02/2017   Generalized osteoarthritis of hand 12/11/2016   GAD (generalized anxiety disorder) 12/11/2016   Estrogen deficiency 05/11/2016   Encounter for screening mammogram for breast cancer 05/11/2016   OSA (obstructive sleep apnea) 03/06/2016   Fatigue 01/25/2016   Left knee pain 10/18/2015   Snoring 10/18/2015   Routine general medical examination at a health care facility 02/28/2015   Encounter for Medicare annual wellness exam 02/06/2013   Hyperlipidemia 08/27/2012   Irregular heart beat 08/05/2012   Encounter for gynecological examination 12/20/2010   Osteopenia 12/17/2009   BACK PAIN, LUMBAR 07/02/2009   IRRITABLE  BOWEL SYNDROME 04/02/2009   Asymptomatic postmenopausal status 12/10/2008   Hypothyroidism 12/05/2007   Meniere's disease 12/05/2007   GERD 12/05/2007    PCP: Dr. Deri Fleet  REFERRING PROVIDER: Dr. Hardy Lia  REFERRING DIAG: Right intertrochanteric fracture; Piriformis syndrome  THERAPY DIAG:  Abnormality of gait and mobility  Difficulty in walking, not elsewhere classified  Muscle weakness (generalized)  Other abnormalities of gait and mobility  Pain in right hip  Other lack of coordination  Rationale for Evaluation and Treatment: Rehabilitation  ONSET DATE: 09/05/2023  SUBJECTIVE:   SUBJECTIVE STATEMENT:   Patient reports pain is worse today- still waiting on hearing back from MD office regarding injection.    PERTINENT HISTORY: Per hospital H&P from 09/05/2023: Sarah Harper is a 83 year old female with past medical history significant for HTN, hypothyroidism, GERD, osteopenia, dyslipidemia, OSA who presented to [facility] ED on 1/15 fall mechanical fall at home sustaining injury to her right hip with acute pain.  Unable to ambulate.  Workup in the ED notable for acute impacted, angulated, displaced intertrochanteric fracture proximal right femur.  Orthopedics was consulted with plan for operative management on 09/06/2023.  Patient underwent intramedullary nailing of the right hip 09/06/2023 by Dr. Guyann Leitz.  She tested positive for COVID on 09/08/2023 and completed isolation and was discharge to skilled nursing facility.  PAIN:  Are you having pain? Yes: NPRS scale: current 1-2/10; best 0/10 and sleep pretty; up to 8/10 at worst Pain location: R glute region Pain description: ache but sharp at times Aggravating factors: walking on unlevel surfaces Relieving factors: lying down; medication.   PRECAUTIONS: Fall  RED FLAGS: None   WEIGHT BEARING RESTRICTIONS: No  FALLS:  Has patient fallen in last 6 months? Yes. Number of falls 1  LIVING ENVIRONMENT: Lives with:  lives with their spouse Lives in: House/apartment Stairs: Yes: Internal: 2 steps; on right going up and grab bar on left. Has following equipment at home: Single point cane, Walker - 2 wheeled, Grab bars, and elevated toilet  OCCUPATION: Retired -  PLOF: Independent  PATIENT GOALS: I want to walk without my walker  NEXT MD VISIT: 12/12/2023  OBJECTIVE:  Note: Objective measures were completed at Evaluation unless otherwise noted.  DIAGNOSTIC FINDINGS: CLINICAL DATA:  Fall onto right hip.   EXAM: DG HIP (WITH OR WITHOUT PELVIS) 2-3V RIGHT   COMPARISON:  None Available.   FINDINGS: There is an acute impacted intertrochanteric fracture of the proximal right femur with fracture margins extending through the greater and lesser trochanters. There is approximately 1.8 cm of lateral displacement of the distal segment with apex varus angulation. The right femoral head is seated within the acetabulum. Evaluation of the sacrum is limited due to overlying bowel-gas. The sacroiliac joints and pubic symphysis otherwise appear anatomically aligned.   IMPRESSION: Acute impacted, angulated, and displaced intertrochanteric fracture of the  proximal right femur.     Electronically Signed   By: Mannie Seek M.D.   On: 09/05/2023 08:21  PATIENT SURVEYS:  LEFS 22/80  COGNITION: Overall cognitive status: Within functional limits for tasks assessed     SENSATION: WFL  EDEMA:  Wearing compression stocking on left    PALPATION: (+) tenderness along right posterior gluteal region  LOWER EXTREMITY ROM:  Active ROM Right eval Left eval  Hip flexion    Hip extension    Hip abduction    Hip adduction    Hip internal rotation    Hip external rotation    Knee flexion    Knee extension    Ankle dorsiflexion    Ankle plantarflexion    Ankle inversion    Ankle eversion     (Blank rows = not tested)  LOWER EXTREMITY MMT:  MMT Right eval Left eval  Hip flexion 3+ *pain  4  Hip extension 3+ 4  Hip abduction 3+ *pain 4  Hip adduction    Hip internal rotation 3+ 4  Hip external rotation 3+ 4  Knee flexion 4 4  Knee extension 4 4  Ankle dorsiflexion 4 4  Ankle plantarflexion    Ankle inversion    Ankle eversion     (Blank rows = not tested)  LOWER EXTREMITY SPECIAL TESTS:  To be assessed next visit  FUNCTIONAL TESTS:  5 times sit to stand: 13.98 sec with heavy BUE Support - unable to perform without UE support despite multiple attempts Timed up and go (TUG): 28.14 sec with RW 6 minute walk test: to be assessed visit #2 10 meter walk test: 15.22 and 15.77 sec with RW= 0.65 m/s Berg Balance Scale: To be assessed visit #2  GAIT: Distance walked: approx 100 feet Assistive device utilized: Walker - 2 wheeled Level of assistance: CGA Comments: decreased step length, Increased UE support on walker, antalgic gait                                                                                                                                TREATMENT DATE: 12/20/23    Self care/Home management:  Review of anatomy of posterior hip with patient and her husband Discussion of pain management strategies including adding heat in morning and continued ice after exercises.   TE:  Bridging 2x 10  Ham set 5 sec  (2x10)  Hip abd/add x 10 Active and x 10 AAROM Resistive Hip ER Hooklye RTB 2 x 10  Resistive Hip IR Hooklye 2 x 10 reps           PATIENT EDUCATION:  Education details: Exercise technique Person educated: Patient and Spouse Education method: Explanation, Demonstration, Tactile cues, and Verbal cues Education comprehension: verbalized understanding and returned demonstration  HOME EXERCISE PROGRAM: Access Code: [facility] URL: [email or web address] Date: 11/20/2023 Prepared by: Ferrell Hu  Exercises - Supine Bridge  - 3 x weekly - 3 sets - 10  reps - Clamshell with Resistance  - 3 x weekly - 3 sets - 10 reps -  Standing Diagonal Hip Extension and External Rotation  - 3 x weekly - 3 sets - 10 reps - Standing Hip Abduction with Counter Support  - 3 x weekly - 3 sets - 10 reps - Standing March with Counter Support  - 3 x weekly - 3 sets - 10 reps     Access Code: [email or web address] URL: [email or web address] Date: 11/13/2023 Prepared by: Ferrell Hu  Exercises - Supine Piriformis Stretch with Foot on Ground  - 1 x daily - 3 sets - 30 sec hold - Seated Piriformis Stretch with Trunk Bend  - 1 x daily - 3 sets - 30 sec hold - Seated Piriformis Release With [employer]  - 1 x daily - 3 sets - 10 reps  ASSESSMENT:  CLINICAL IMPRESSION: Treatment modified today as patient was reporting more pain Right gluteal region. Treatment focused around more simple exercises with less weight bearing to target gluteal/hip muscles without increased stress. She responded well overall without report of any increased pain. Will continue to monitor symptoms and modify treatment as necessary.  Patient will benefit from skilled PT services to address her right hip pain, muscle weakness, and impaired mobility to restore her to her previous level of independence with improved quality of life and decreased risk of falling.    OBJECTIVE IMPAIRMENTS: Abnormal gait, decreased activity tolerance, decreased balance, decreased coordination, decreased endurance, decreased mobility, difficulty walking, decreased strength, and pain.   ACTIVITY LIMITATIONS: carrying, lifting, bending, standing, squatting, stairs, and transfers  PARTICIPATION LIMITATIONS: meal prep, cleaning, laundry, shopping, community activity, and yard work  PERSONAL FACTORS: Age and 1-2 comorbidities: anxiety, Osteoporosis  are also affecting patient's functional outcome.   REHAB POTENTIAL: Good  CLINICAL DECISION MAKING: Evolving/moderate complexity  EVALUATION COMPLEXITY: Moderate   GOALS: Goals reviewed with patient? Yes  SHORT TERM  GOALS: Target date: 12/20/2023 Pt will be independent with HEP in order to improve strength and balance in order to decrease fall risk and improve function at home and work.  Baseline: EVAL: No formal HEP in place; 12/13/2023- Patient reports compliant with HEP to date- States no questions or concerns Goal status: MET   LONG TERM GOALS: Target date: 02/01/2024  Pt will increase LEFS by at least 9 points in order to demonstrate significant improvement in lower extremity function. Baseline: EVAL: 22/80 Goal status: INITIAL  2.  Pt will decrease worst pain as reported on NPRS by at least 3 points in order to demonstrate clinically significant reduction in ankle/foot pain.  Baseline: EVAL= 8/10 right hip pain at worst; 12/13/2023= Right hip 4/10 at worst  Goal status: PROGRESSING  3.  Pt will decrease 5TSTS by at least 3 seconds in order to demonstrate clinically significant improvement in LE strength. Baseline: EVAL 13.98 sec with heavy BUE Support - unable to perform without UE support despite multiple attempts; 12/13/2023- Still unable to stand without UE Support yet did progress to only using 1 UE support today in 19.03 sec.  Goal status: PROGRESSING  4.  Pt will decrease TUG to below 14 seconds/decrease in order to demonstrate decreased fall risk. Baseline: EVAL=28.14 sec with RW; 12/13/2023- 16.15 sec with Hurrycane Goal status: PROGRESSING  5.  Patient will improve gait speed by 0.2 m/s for improved functional mobility and decreased risk of falling. Baseline: EVAL=0.65 m/s with RW; 12/13/2023= 0.74 m/s using hurrycane Goal status: INITIAL  6.  Pt will improve BERG by  at least 3 points in order to demonstrate clinically significant improvement in balance.  Baseline: EVAL- To be assessed visit #2; 11/13/2023= 45/56 Goal status: INITIAL  7. Pt will increase (able to complete the test without stopping) and > 800 feet in order to demonstrate clinically significant improvement in  cardiopulmonary endurance and community ambulation   Baseline: 11/20/2023= 450 feet in 4 min (Stopped due to pain); 12/13/2023= 880 with cane-with 2/10 right hip pain  Goal status: PROGRESSING   PLAN:  PT FREQUENCY: 1-2x/week  PT DURATION: 12 weeks  PLANNED INTERVENTIONS: 97164- PT Re-evaluation, 97110-Therapeutic exercises, 97530- Therapeutic activity, 97112- Neuromuscular re-education, 97535- Self Care, 82956- Manual therapy, [id number]- Gait training, [phone]- Orthotic Fit/training, [phone]- Canalith repositioning, [phone]- Electrical stimulation (manual), Patient/Family education, Balance training, Stair training, Taping, Dry Needling, Joint mobilization, Spinal mobilization, Scar mobilization, Compression bandaging, Vestibular training, DME instructions, Cryotherapy, and Moist heat  PLAN FOR NEXT SESSION:  progress  strengthening and balance activities. Mobility with education in gait training and use of appropriate AD. Progress HEP. Continue to progress with standing weight bearing activities.    Ossie Blend, PT Physical Therapist - [facility]  12/20/2023, 5:07 PM

## 2023-12-21 ENCOUNTER — Other Ambulatory Visit: Payer: Self-pay | Admitting: Orthopedic Surgery

## 2023-12-21 DIAGNOSIS — M461 Sacroiliitis, not elsewhere classified: Secondary | ICD-10-CM

## 2023-12-22 ENCOUNTER — Encounter: Payer: Self-pay | Admitting: Family Medicine

## 2023-12-24 MED ORDER — METOPROLOL TARTRATE 25 MG PO TABS: 12.5000 mg | ORAL_TABLET | Freq: Two times a day (BID) | ORAL | 1 refills | Status: DC

## 2023-12-25 ENCOUNTER — Ambulatory Visit

## 2023-12-25 DIAGNOSIS — M6281 Muscle weakness (generalized): Secondary | ICD-10-CM

## 2023-12-25 DIAGNOSIS — R269 Unspecified abnormalities of gait and mobility: Secondary | ICD-10-CM | POA: Diagnosis not present

## 2023-12-25 DIAGNOSIS — M25551 Pain in right hip: Secondary | ICD-10-CM

## 2023-12-25 DIAGNOSIS — R262 Difficulty in walking, not elsewhere classified: Secondary | ICD-10-CM

## 2023-12-25 DIAGNOSIS — R278 Other lack of coordination: Secondary | ICD-10-CM

## 2023-12-25 DIAGNOSIS — R2689 Other abnormalities of gait and mobility: Secondary | ICD-10-CM

## 2023-12-25 NOTE — Therapy (Signed)
 OUTPATIENT PHYSICAL THERAPY LOWER EXTREMITY TREATMTENT   Patient Name: Sarah Harper MRN: [id number] DOB:1941-04-30, 83 y.o., female Today's Date: 12/25/2023  END OF SESSION:  PT End of Session - 12/25/23 1026     Visit Number 13    Number of Visits 24    Date for PT Re-Evaluation 01/31/24    Progress Note Due on Visit 20    PT Start Time 1017    Equipment Utilized During Treatment Gait belt    Activity Tolerance Patient tolerated treatment well    Behavior During Therapy [facility] for tasks assessed/performed                   Past Medical History:  Diagnosis Date   Anxiety    Blood loss anemia 09/13/2023   Cataract 1997   Colon cancer screening 02/05/2018   DDD (degenerative disc disease)    chronic back pain   ETD (eustachian tube dysfunction)    GERD (gastroesophageal reflux disease)    Hypertension 2022   IBS (irritable bowel syndrome)    Meniere's disease    Migraine    still gets visual aura from time to time   Mild cognitive impairment    Osteoporosis    Sleep apnea    CPAP   Stroke (HCC) 2022   Syncopal episodes    after work-up - possible seizures?   Thyroid  disease    hypothyroid   TIA (transient ischemic attack) 11/09/2020   Past Surgical History:  Procedure Laterality Date   APPENDECTOMY     CATARACT EXTRACTION W/PHACO Right 05/21/2019   Procedure: CATARACT EXTRACTION PHACO AND INTRAOCULAR LENS PLACEMENT (IOC) RIGHT panoptix lens  00:35.0  21.7%  7.61;  Surgeon: Annell Kidney, MD;  Location: [facility];  Service: Ophthalmology;  Laterality: Right;  sleep apnea requests early   CATARACT EXTRACTION W/PHACO Left 06/11/2019   Procedure: CATARACT EXTRACTION PHACO AND INTRAOCULAR LENS PLACEMENT (IOC) LEFT PANOPTIX TORIC LENS  00:50.0  20.3%  10.21;  Surgeon: Annell Kidney, MD;  Location: [facility];  Service: Ophthalmology;  Laterality: Left;  sleep apnea requests early   COLONOSCOPY     last 2012 - [facility]    ESOPHAGOGASTRODUODENOSCOPY     Multiple, last 01/07/2017 with Savary dilation to 18 mm   EYE SURGERY  August 2022   Cataracs removed   HEMORRHOID BANDING  2018   [facility]   INTRAMEDULLARY (IM) NAIL INTERTROCHANTERIC Right 09/06/2023   Procedure: INTRAMEDULLARY (IM) NAIL INTERTROCHANTERIC;  Surgeon: Hardy Lia, MD;  Location: [facility] OR;  Service: Orthopedics;  Laterality: Right;   LUMBAR DISC SURGERY  1984   L5    Patient Active Problem List   Diagnosis Date Noted   Skin lesion of right leg 10/18/2023   Hyponatremia 10/18/2023   Blood loss anemia 09/13/2023   Closed right hip fracture (HCC) 09/05/2023   Abnormal urinalysis 09/02/2023   Acute cystitis 08/21/2023   Current use of proton pump inhibitor 03/13/2023   Pain in joint, lower leg 01/30/2023   Senile purpura (HCC) 12/26/2022   Skin tear of right lower leg without complication 12/26/2022   Dysuria 10/21/2021   Elevated alkaline phosphatase level 03/11/2021   Palpitations 11/10/2020   Migraine    Hypertension, essential    H/O: CVA (cerebrovascular accident) 11/08/2020   Headache 09/26/2019   Fall at home 06/02/2019   Colon cancer screening 02/05/2018   Degenerative joint disease (DJD) of lumbar spine 05/02/2017   Internal hemorrhoids 05/02/2017   Generalized osteoarthritis of hand 12/11/2016  GAD (generalized anxiety disorder) 12/11/2016   Estrogen deficiency 05/11/2016   Encounter for screening mammogram for breast cancer 05/11/2016   OSA (obstructive sleep apnea) 03/06/2016   Fatigue 01/25/2016   Left knee pain 10/18/2015   Snoring 10/18/2015   Routine general medical examination at a health care facility 02/28/2015   Encounter for Medicare annual wellness exam 02/06/2013   Hyperlipidemia 08/27/2012   Irregular heart beat 08/05/2012   Encounter for gynecological examination 12/20/2010   Osteopenia 12/17/2009   BACK PAIN, LUMBAR 07/02/2009   IRRITABLE BOWEL SYNDROME 04/02/2009   Asymptomatic postmenopausal status  12/10/2008   Hypothyroidism 12/05/2007   Meniere's disease 12/05/2007   GERD 12/05/2007    PCP: Dr. Deri Fleet  REFERRING PROVIDER: Dr. Hardy Lia  REFERRING DIAG: Right intertrochanteric fracture; Piriformis syndrome  THERAPY DIAG:  Abnormality of gait and mobility  Difficulty in walking, not elsewhere classified  Muscle weakness (generalized)  Other abnormalities of gait and mobility  Pain in right hip  Other lack of coordination  Rationale for Evaluation and Treatment: Rehabilitation  ONSET DATE: 09/05/2023  SUBJECTIVE:   SUBJECTIVE STATEMENT:   Patient reports continued pain in right hip and states she is only able to bridge a little due to pain. States she just received word that her injection is scheduled for this Friday. States still not using her walker much at home.     PERTINENT HISTORY: Per hospital H&P from 09/05/2023: Sarah Harper is a 83 year old female with past medical history significant for HTN, hypothyroidism, GERD, osteopenia, dyslipidemia, OSA who presented to [facility] ED on 1/15 fall mechanical fall at home sustaining injury to her right hip with acute pain.  Unable to ambulate.  Workup in the ED notable for acute impacted, angulated, displaced intertrochanteric fracture proximal right femur.  Orthopedics was consulted with plan for operative management on 09/06/2023.  Patient underwent intramedullary nailing of the right hip 09/06/2023 by Dr. Guyann Leitz.  She tested positive for COVID on 09/08/2023 and completed isolation and was discharge to skilled nursing facility.  PAIN:  Are you having pain? Yes: NPRS scale: current 1-2/10; best 0/10 and sleep pretty; up to 8/10 at worst Pain location: R glute region Pain description: ache but sharp at times Aggravating factors: walking on unlevel surfaces Relieving factors: lying down; medication.   PRECAUTIONS: Fall  RED FLAGS: None   WEIGHT BEARING RESTRICTIONS: No  FALLS:  Has patient fallen in last 6  months? Yes. Number of falls 1  LIVING ENVIRONMENT: Lives with: lives with their spouse Lives in: House/apartment Stairs: Yes: Internal: 2 steps; on right going up and grab bar on left. Has following equipment at home: Single point cane, Walker - 2 wheeled, Grab bars, and elevated toilet  OCCUPATION: Retired -  PLOF: Independent  PATIENT GOALS: I want to walk without my walker  NEXT MD VISIT: 12/12/2023  OBJECTIVE:  Note: Objective measures were completed at Evaluation unless otherwise noted.  DIAGNOSTIC FINDINGS: CLINICAL DATA:  Fall onto right hip.   EXAM: DG HIP (WITH OR WITHOUT PELVIS) 2-3V RIGHT   COMPARISON:  None Available.   FINDINGS: There is an acute impacted intertrochanteric fracture of the proximal right femur with fracture margins extending through the greater and lesser trochanters. There is approximately 1.8 cm of lateral displacement of the distal segment with apex varus angulation. The right femoral head is seated within the acetabulum. Evaluation of the sacrum is limited due to overlying bowel-gas. The sacroiliac joints and pubic symphysis otherwise appear anatomically aligned.   IMPRESSION:  Acute impacted, angulated, and displaced intertrochanteric fracture of the proximal right femur.     Electronically Signed   By: Mannie Seek M.D.   On: 09/05/2023 08:21  PATIENT SURVEYS:  LEFS 22/80  COGNITION: Overall cognitive status: Within functional limits for tasks assessed     SENSATION: WFL  EDEMA:  Wearing compression stocking on left    PALPATION: (+) tenderness along right posterior gluteal region  LOWER EXTREMITY ROM:  Active ROM Right eval Left eval  Hip flexion    Hip extension    Hip abduction    Hip adduction    Hip internal rotation    Hip external rotation    Knee flexion    Knee extension    Ankle dorsiflexion    Ankle plantarflexion    Ankle inversion    Ankle eversion     (Blank rows = not tested)  LOWER  EXTREMITY MMT:  MMT Right eval Left eval  Hip flexion 3+ *pain 4  Hip extension 3+ 4  Hip abduction 3+ *pain 4  Hip adduction    Hip internal rotation 3+ 4  Hip external rotation 3+ 4  Knee flexion 4 4  Knee extension 4 4  Ankle dorsiflexion 4 4  Ankle plantarflexion    Ankle inversion    Ankle eversion     (Blank rows = not tested)  LOWER EXTREMITY SPECIAL TESTS:  To be assessed next visit  FUNCTIONAL TESTS:  5 times sit to stand: 13.98 sec with heavy BUE Support - unable to perform without UE support despite multiple attempts Timed up and go (TUG): 28.14 sec with RW 6 minute walk test: to be assessed visit #2 10 meter walk test: 15.22 and 15.77 sec with RW= 0.65 m/s Berg Balance Scale: To be assessed visit #2  GAIT: Distance walked: approx 100 feet Assistive device utilized: Walker - 2 wheeled Level of assistance: CGA Comments: decreased step length, Increased UE support on walker, antalgic gait                                                                                                                                TREATMENT DATE: 12/25/23    Self care/Home management:  Review of previous Lumbar MRI results from 2016 and how spinal nerves could refer pain vs. SI joint vs. Piriformis vs. Hip Osteoarthritis symptoms.  Discussed revising exercises and emphasized to only perform activities that do not cause a catching sensation. Switched bridging for gluteal sets. Review from previous visit discussion of pain management strategies including adding heat in morning and continued ice after exercises.   TA:   3-way SLR BLE - 10 reps x 2 sets Resistive Hip abd RTB at support bar- down and back x 6 Hip circles CW/CCW 2 sets of 10 on RLE and 2 sets of 5 on Left LE.              PATIENT EDUCATION:  Education details:  Exercise technique Person educated: Patient and Spouse Education method: Explanation, Demonstration, Tactile cues, and Verbal cues Education  comprehension: verbalized understanding and returned demonstration  HOME EXERCISE PROGRAM: Access Code: [facility] URL: [email or web address] Date: 11/20/2023 Prepared by: Ferrell Hu  Exercises - Supine Bridge  - 3 x weekly - 3 sets - 10 reps - Clamshell with Resistance  - 3 x weekly - 3 sets - 10 reps - Standing Diagonal Hip Extension and External Rotation  - 3 x weekly - 3 sets - 10 reps - Standing Hip Abduction with Counter Support  - 3 x weekly - 3 sets - 10 reps - Standing March with Counter Support  - 3 x weekly - 3 sets - 10 reps     Access Code: [email or web address] URL: [email or web address] Date: 11/13/2023 Prepared by: Ferrell Hu  Exercises - Supine Piriformis Stretch with Foot on Ground  - 1 x daily - 3 sets - 30 sec hold - Seated Piriformis Stretch with Trunk Bend  - 1 x daily - 3 sets - 30 sec hold - Seated Piriformis Release With [employer]  - 1 x daily - 3 sets - 10 reps  ASSESSMENT:  CLINICAL IMPRESSION: Treatment modified today as patient was reporting more pain Right gluteal region. She is now scheduled to have right posterior gluteal/hip injection on Friday. Reviewed some pain free activities performed today and modified home program based on recent pain.  She is going to rest and perform light activities until her injection on Friday and return to PT next week and continue as appropriate post injection.  Patient will benefit from skilled PT services to address her right hip pain, muscle weakness, and impaired mobility to restore her to her previous level of independence with improved quality of life and decreased risk of falling.    OBJECTIVE IMPAIRMENTS: Abnormal gait, decreased activity tolerance, decreased balance, decreased coordination, decreased endurance, decreased mobility, difficulty walking, decreased strength, and pain.   ACTIVITY LIMITATIONS: carrying, lifting, bending, standing, squatting, stairs, and  transfers  PARTICIPATION LIMITATIONS: meal prep, cleaning, laundry, shopping, community activity, and yard work  PERSONAL FACTORS: Age and 1-2 comorbidities: anxiety, Osteoporosis  are also affecting patient's functional outcome.   REHAB POTENTIAL: Good  CLINICAL DECISION MAKING: Evolving/moderate complexity  EVALUATION COMPLEXITY: Moderate   GOALS: Goals reviewed with patient? Yes  SHORT TERM GOALS: Target date: 12/20/2023 Pt will be independent with HEP in order to improve strength and balance in order to decrease fall risk and improve function at home and work.  Baseline: EVAL: No formal HEP in place; 12/13/2023- Patient reports compliant with HEP to date- States no questions or concerns Goal status: MET   LONG TERM GOALS: Target date: 02/01/2024  Pt will increase LEFS by at least 9 points in order to demonstrate significant improvement in lower extremity function. Baseline: EVAL: 22/80 Goal status: INITIAL  2.  Pt will decrease worst pain as reported on NPRS by at least 3 points in order to demonstrate clinically significant reduction in ankle/foot pain.  Baseline: EVAL= 8/10 right hip pain at worst; 12/13/2023= Right hip 4/10 at worst  Goal status: PROGRESSING  3.  Pt will decrease 5TSTS by at least 3 seconds in order to demonstrate clinically significant improvement in LE strength. Baseline: EVAL 13.98 sec with heavy BUE Support - unable to perform without UE support despite multiple attempts; 12/13/2023- Still unable to stand without UE Support yet did progress to only using 1 UE support today in 19.03 sec.  Goal status: PROGRESSING  4.  Pt will decrease TUG to below 14 seconds/decrease in order to demonstrate decreased fall risk. Baseline: EVAL=28.14 sec with RW; 12/13/2023- 16.15 sec with Hurrycane Goal status: PROGRESSING  5.  Patient will improve gait speed by 0.2 m/s for improved functional mobility and decreased risk of falling. Baseline: EVAL=0.65 m/s with RW;  12/13/2023= 0.74 m/s using hurrycane Goal status: INITIAL  6.  Pt will improve BERG by at least 3 points in order to demonstrate clinically significant improvement in balance.  Baseline: EVAL- To be assessed visit #2; 11/13/2023= 45/56 Goal status: INITIAL  7. Pt will increase (able to complete the test without stopping) and > 800 feet in order to demonstrate clinically significant improvement in cardiopulmonary endurance and community ambulation   Baseline: 11/20/2023= 450 feet in 4 min (Stopped due to pain); 12/13/2023= 880 with cane-with 2/10 right hip pain  Goal status: PROGRESSING   PLAN:  PT FREQUENCY: 1-2x/week  PT DURATION: 12 weeks  PLANNED INTERVENTIONS: 97164- PT Re-evaluation, 97110-Therapeutic exercises, 97530- Therapeutic activity, 97112- Neuromuscular re-education, 97535- Self Care, 16109- Manual therapy, [id number]- Gait training, [phone]- Orthotic Fit/training, [phone]- Canalith repositioning, [phone]- Electrical stimulation (manual), Patient/Family education, Balance training, Stair training, Taping, Dry Needling, Joint mobilization, Spinal mobilization, Scar mobilization, Compression bandaging, Vestibular training, DME instructions, Cryotherapy, and Moist heat  PLAN FOR NEXT SESSION:   Reassess all symptoms post injection Resume/progress Hip strengthening as appropriate based on pain symptoms Resume any manual therapy as appropriate for pain relief.  Ossie Blend, PT Physical Therapist - [facility]  12/25/2023, 10:54 AM

## 2023-12-26 ENCOUNTER — Ambulatory Visit: Payer: Medicare HMO

## 2023-12-26 VITALS — Ht 64.0 in | Wt 128.0 lb

## 2023-12-26 DIAGNOSIS — Z0000 Encounter for general adult medical examination without abnormal findings: Secondary | ICD-10-CM | POA: Diagnosis not present

## 2023-12-26 NOTE — Patient Instructions (Signed)
 Ms. Jankowski , Thank you for taking time to come for your Medicare Wellness Visit. I appreciate your ongoing commitment to your health goals. Please review the following plan we discussed and let me know if I can assist you in the future.   Referrals/Orders/Follow-Ups/Clinician Recommendations: none  This is a list of the screening recommended for you and due dates:  Health Maintenance  Topic Date Due   COVID-19 Vaccine (5 - 2024-25 season) 08/23/2024*   Mammogram  03/04/2024   Flu Shot  03/21/2024   Medicare Annual Wellness Visit  12/25/2024   DEXA scan (bone density measurement)  04/28/2025   DTaP/Tdap/Td vaccine (4 - Td or Tdap) 01/29/2033   Pneumonia Vaccine  Completed   Zoster (Shingles) Vaccine  Completed   HPV Vaccine  Aged Out   Meningitis B Vaccine  Aged Out  *Topic was postponed. The date shown is not the original due date.    Advanced directives: (In Chart) A copy of your advanced directives are scanned into your chart should your provider ever need it.  Next Medicare Annual Wellness Visit scheduled for next year: Yes 12/27/23 @ 1:40pm televsit

## 2023-12-26 NOTE — Progress Notes (Signed)
 Subjective:   Sarah Harper is a 83 y.o. who presents for a Medicare Wellness preventive visit.  Visit Complete: Virtual I connected with  Sarah Harper on 12/26/23 by a audio enabled telemedicine application and verified that I am speaking with the correct person using two identifiers.  Patient Location: Home  Provider Location: Office/Clinic  I discussed the limitations of evaluation and management by telemedicine. The patient expressed understanding and agreed to proceed.  Vital Signs: Because this visit was a virtual/telehealth visit, some criteria may be missing or patient reported. Any vitals not documented were not able to be obtained and vitals that have been documented are patient reported.  VideoDeclined- This patient declined Librarian, academic. Therefore the visit was completed with audio only.  Persons Participating in Visit: Patient.  AWV Questionnaire: No: Patient Medicare AWV questionnaire was not completed prior to this visit.  Cardiac Risk Factors include: advanced age (>54men, >7 women);hypertension;sedentary lifestyle     Objective:    Today's Vitals   12/26/23 1341 12/26/23 1342  Weight: 128 lb (58.1 kg)   Height: 5\' 4"  (1.626 m)   PainSc:  4    Body mass index is 21.97 kg/m.     12/26/2023    1:56 PM 11/09/2023    7:38 AM 09/05/2023    7:26 AM 02/02/2023    8:35 AM 01/30/2023   11:17 AM 12/25/2022    1:05 PM 12/21/2021   12:10 PM  Advanced Directives  Does Patient Have a Medical Advance Directive? Yes Yes Yes Yes Yes Yes Yes  Type of Estate agent of [place];Living will Healthcare Power of [place];Living will Healthcare Power of [place];Living will Healthcare Power of [place];Living will Healthcare Power of [place];Living will Healthcare Power of [place];Living will Healthcare Power of [place];Living will  Does patient want to make changes to medical advance directive?  No - Patient declined        Copy of Healthcare Power of Attorney in Chart? Yes - validated most recent copy scanned in chart (See row information) No - copy requested    No - copy requested No - copy requested    Current Medications (verified) Outpatient Encounter Medications as of 12/26/2023  Medication Sig   acetaminophen  (TYLENOL ) 500 MG tablet Take 500 mg by mouth every 6 (six) hours as needed for moderate pain (pain score 4-6).   amLODipine  (NORVASC ) 5 MG tablet Take 1/2 tablet (2.5 mg) by mouth daily   B Complex-C (SUPER B COMPLEX PO) Take 1 tablet by mouth daily.   butalbital -acetaminophen -caffeine  (FIORICET) 50-325-40 MG tablet Take 1 tablet by mouth every 6 (six) hours as needed.   cholecalciferol  (VITAMIN D ) 25 MCG (1000 UNIT) tablet Take 1,000 Units by mouth daily.   diclofenac  sodium (VOLTAREN ) 1 % GEL APPLY 2 GRAMS TOPICALLY FOUR TIMES A DAY TO AFFECTED AREAS (Patient taking differently: Apply 2 g topically 2 (two) times daily.)   estradiol  (ESTRACE ) 0.1 MG/GM vaginal cream INSERT SMALL AMOUNT (1 CM) VAGINALLY EVERY OTHER DAY   famotidine  (PEPCID ) 40 MG tablet Take 40 mg by mouth daily as needed for heartburn or indigestion.   levothyroxine  (SYNTHROID ) 75 MCG tablet Take 1 tablet (75 mcg total) by mouth daily before breakfast.   linaclotide  (LINZESS ) 145 MCG CAPS capsule Take 1 capsule (145 mcg total) by mouth as needed.   LORazepam  (ATIVAN ) 0.5 MG tablet Take 1 tablet (0.5 mg total) by mouth 2 (two) times daily as needed for anxiety.   metoprolol  tartrate (LOPRESSOR )  25 MG tablet Take 0.5 tablets (12.5 mg total) by mouth 2 (two) times daily.   mometasone (NASONEX) 50 MCG/ACT nasal spray Place 2 sprays into the nose daily as needed (Rhinitis).   omeprazole  (PRILOSEC) 40 MG capsule Take 1 capsule (40 mg total) by mouth daily.   sodium chloride  1 g tablet Take 1 tablet (1 g total) by mouth 2 (two) times daily with a meal.   sucralfate  (CARAFATE ) 1 g tablet Take 1 g by mouth 4 (four) times daily as needed  (Esophagitis).   No facility-administered encounter medications on file as of 12/26/2023.    Allergies (verified) Aspirin , Nsaids, and Tolmetin   History: Past Medical History:  Diagnosis Date   Anxiety    Blood loss anemia 09/13/2023   Cataract 1997   Colon cancer screening 02/05/2018   DDD (degenerative disc disease)    chronic back pain   ETD (eustachian tube dysfunction)    GERD (gastroesophageal reflux disease)    Hypertension 2022   IBS (irritable bowel syndrome)    Meniere's disease    Migraine    still gets visual aura from time to time   Mild cognitive impairment    Osteoporosis    Sleep apnea    CPAP   Stroke (HCC) 2022   Syncopal episodes    after work-up - possible seizures?   Thyroid  disease    hypothyroid   TIA (transient ischemic attack) 11/09/2020   Past Surgical History:  Procedure Laterality Date   APPENDECTOMY     CATARACT EXTRACTION W/PHACO Right 05/21/2019   Procedure: CATARACT EXTRACTION PHACO AND INTRAOCULAR LENS PLACEMENT (IOC) RIGHT panoptix lens  00:35.0  21.7%  7.61;  Surgeon: Annell Kidney, MD;  Location: [facility];  Service: Ophthalmology;  Laterality: Right;  sleep apnea requests early   CATARACT EXTRACTION W/PHACO Left 06/11/2019   Procedure: CATARACT EXTRACTION PHACO AND INTRAOCULAR LENS PLACEMENT (IOC) LEFT PANOPTIX TORIC LENS  00:50.0  20.3%  10.21;  Surgeon: Annell Kidney, MD;  Location: [facility];  Service: Ophthalmology;  Laterality: Left;  sleep apnea requests early   COLONOSCOPY     last 2012 - [facility]   ESOPHAGOGASTRODUODENOSCOPY     Multiple, last 01/07/2017 with Savary dilation to 18 mm   EYE SURGERY  August 2022   Cataracs removed   HEMORRHOID BANDING  2018   [facility]   INTRAMEDULLARY (IM) NAIL INTERTROCHANTERIC Right 09/06/2023   Procedure: INTRAMEDULLARY (IM) NAIL INTERTROCHANTERIC;  Surgeon: Hardy Lia, MD;  Location: [facility];  Service: Orthopedics;  Laterality: Right;   LUMBAR DISC SURGERY   1984   L5    Family History  Problem Relation Age of Onset   Diabetes Paternal Aunt    Breast cancer Paternal Aunt    Hypertension Maternal Grandmother    Hypertension Maternal Grandfather    Hypertension Sister    Social History   Socioeconomic History   Marital status: Married    Spouse name: Erland   Number of children: 0   Years of education: Not on file   Highest education level: Some college, no degree  Occupational History   Occupation: Retired  Tobacco Use   Smoking status: Never    Passive exposure: Never   Smokeless tobacco: Never  Vaping Use   Vaping status: Never Used  Substance and Sexual Activity   Alcohol use: Yes    Comment: Average 1 glass of wine/week   Drug use: Yes    Types: Marijuana    Comment: CBD drops  Sexual activity: Yes    Birth control/protection: Post-menopausal  Other Topics Concern   Not on file  Social History Narrative   Married, husband is a type I diabetic.  No children.   Elderly mother in her 90s lives in an assisted living facility.   Very occasional white wine with dinner, never smoker no drug use.  Limited caffeine .   Social Drivers of Corporate investment banker Strain: Low Risk  (12/26/2023)   Overall Financial Resource Strain (CARDIA)    Difficulty of Paying Living Expenses: Not hard at all  Food Insecurity: No Food Insecurity (12/26/2023)   Hunger Vital Sign    Worried About Running Out of Food in the Last Year: Never true    Ran Out of Food in the Last Year: Never true  Transportation Needs: No Transportation Needs (12/26/2023)   PRAPARE - Administrator, Civil Service (Medical): No    Lack of Transportation (Non-Medical): No  Physical Activity: Insufficiently Active (12/26/2023)   Exercise Vital Sign    Days of Exercise per Week: 2 days    Minutes of Exercise per Session: 30 min  Stress: No Stress Concern Present (12/26/2023)   [employer] of Occupational Health - Occupational Stress Questionnaire     Feeling of Stress : Not at all  Social Connections: Moderately Integrated (12/26/2023)   Social Connection and Isolation Panel [NHANES]    Frequency of Communication with Friends and Family: More than three times a week    Frequency of Social Gatherings with Friends and Family: Once a week    Attends Religious Services: 1 to 4 times per year    Active Member of [employer] or Organizations: No    Attends Banker Meetings: Never    Marital Status: Married    Tobacco Counseling Counseling given: Not Answered    Clinical Intake:  Pre-visit preparation completed: Yes  Pain : 0-10 Pain Score: 4  Pain Type: Other (Comment) (post hip surgery) Pain Location: Hip Pain Orientation: Right Pain Descriptors / Indicators: Aching Pain Onset: More than a month ago Pain Frequency: Intermittent Pain Relieving Factors: ice, PT, medications  Pain Relieving Factors: ice, PT, medications  BMI - recorded: 21.97 Nutritional Status: BMI of 19-24  Normal Nutritional Risks: None Diabetes: No  Lab Results  Component Value Date   HGBA1C 5.7 (H) 11/09/2020     How often do you need to have someone help you when you read instructions, pamphlets, or other written materials from your doctor or pharmacy?: 1 - Never  Interpreter Needed?: No  Comments: lives with husband Information entered by :: B.Aliyha Fornes,LPN   Activities of Daily Living     12/26/2023    1:57 PM 09/05/2023    6:00 PM  In your present state of health, do you have any difficulty performing the following activities:  Hearing? 1 0  Vision? 0 0  Difficulty concentrating or making decisions? 1 0  Comment concentrating and recalling words   Walking or climbing stairs? 1   Dressing or bathing? 0   Doing errands, shopping? 1 0  Preparing Food and eating ? N   Using the Toilet? N   In the past six months, have you accidently leaked urine? N   Do you have problems with loss of bowel control? N   Managing your Medications? N    Managing your Finances? N   Housekeeping or managing your Housekeeping? N     Patient Care Team: Tower, Manley Seeds, MD as  PCP - General Constancia Delton, MD as PCP - Cardiology (Cardiology) Visser, Jacquelyn D, PA-C as Physician Assistant (Cardiology) Dingeldein, Landon Pinion, MD (Ophthalmology)  Indicate any recent Medical Services you may have received from other than [facility] providers in the past year (date may be approximate).     Assessment:   This is a routine wellness examination for Sarah Harper.  Hearing/Vision screen Hearing Screening - Comments:: Pt says her hearing is quite good Vision Screening - Comments:: Pt wears glasses and had eye exam this am Dr Janeice Medal   Goals Addressed             This Visit's Progress    Patient Stated       I would like to get stronger and healed from hip surgery       Depression Screen     12/26/2023    1:53 PM 08/08/2023    9:52 AM 03/20/2023    9:01 AM 12/26/2022    2:05 PM 12/25/2022    1:03 PM 12/21/2021   12:08 PM 03/19/2020    2:05 PM  PHQ 2/9 Scores  PHQ - 2 Score 0 0 0 0 0 0 2  PHQ- 9 Score  3 1 2   2     Fall Risk     12/26/2023    1:49 PM 08/08/2023    9:52 AM 03/20/2023    9:01 AM 12/26/2022    2:05 PM 12/25/2022    1:07 PM  Fall Risk   Falls in the past year? 1 1 0 0 0  Comment fell at home in Jan2025      Number falls in past yr: 1 0 0 0 0  Injury with Fall? 1 1 0 0 0  Comment rt hip fx      Risk for fall due to : No Fall Risks History of fall(s) No Fall Risks No Fall Risks No Fall Risks  Follow up Education provided;Falls prevention discussed Falls evaluation completed Falls evaluation completed Falls evaluation completed Falls prevention discussed;Falls evaluation completed;Education provided    MEDICARE RISK AT HOME:  Medicare Risk at Home Any stairs in or around the home?: Yes If so, are there any without handrails?: Yes Home free of loose throw rugs in walkways, pet beds, electrical cords, etc?: Yes Adequate lighting  in your home to reduce risk of falls?: Yes Life alert?: No Use of a cane, walker or w/c?: Yes Grab bars in the bathroom?: Yes Shower chair or bench in shower?: No Elevated toilet seat or a handicapped toilet?: Yes  TIMED UP AND GO:  Was the test performed?  No  Cognitive Function: 6CIT completed    03/19/2020    2:08 PM  MMSE - Mini Mental State Exam  Orientation to time 5  Orientation to Place 5  Registration 3  Attention/ Calculation 5  Recall 3  Language- repeat 1        12/26/2023    1:58 PM 12/25/2022    1:08 PM 12/21/2021   12:11 PM  6CIT Screen  What Year? 0 points 0 points 0 points  What month? 0 points 0 points 0 points  What time? 0 points 0 points 0 points  Count back from 20 0 points 0 points 0 points  Months in reverse 0 points 0 points 0 points  Repeat phrase 0 points 2 points 2 points  Total Score 0 points 2 points 2 points    Immunizations Immunization History  Administered Date(s) Administered   Fluad Quad(high Dose  65+) 05/07/2019, 05/22/2020, 05/27/2021, 05/17/2022   Fluad Trivalent(High Dose 65+) 06/20/2023   Influenza Whole 05/22/2007, 05/21/2009   Influenza,inj,Quad PF,6+ Mos 06/11/2013, 05/28/2014, 05/14/2015, 05/12/2016, 05/10/2017, 05/16/2018   PFIZER(Purple Top)SARS-COV-2 Vaccination 09/12/2019, 10/03/2019, 04/24/2020   Pfizer Covid-19 Vaccine Bivalent Booster 80yrs & up 06/17/2021   Pneumococcal Conjugate-13 02/24/2014   Pneumococcal Polysaccharide-23 12/22/2011   Td 12/19/2001, 12/22/2011   Tdap 01/30/2023   Zoster Recombinant(Shingrix) 08/20/2021, 12/14/2021   Zoster, Live 11/12/2012    Screening Tests Health Maintenance  Topic Date Due   COVID-19 Vaccine (5 - 2024-25 season) 08/23/2024 (Originally 04/22/2023)   MAMMOGRAM  03/04/2024   INFLUENZA VACCINE  03/21/2024   Medicare Annual Wellness (AWV)  12/25/2024   DEXA SCAN  04/28/2025   DTaP/Tdap/Td (4 - Td or Tdap) 01/29/2033   Pneumonia Vaccine 50+ Years old  Completed   Zoster  Vaccines- Shingrix  Completed   HPV VACCINES  Aged Out   Meningococcal B Vaccine  Aged Out    Health Maintenance  There are no preventive care reminders to display for this patient.  Health Maintenance Items Addressed: None   Additional Screening:  Vision Screening: Recommended annual ophthalmology exams for early detection of glaucoma and other disorders of the eye.  Dental Screening: Recommended annual dental exams for proper oral hygiene  Community Resource Referral / Chronic Care Management: CRR required this visit?  No   CCM required this visit?  No     Plan:     I have personally reviewed and noted the following in the patient's chart:   Medical and social history Use of alcohol, tobacco or illicit drugs  Current medications and supplements including opioid prescriptions. Patient is not currently taking opioid prescriptions. Functional ability and status Nutritional status Physical activity Advanced directives List of other physicians Hospitalizations, surgeries, and ER visits in previous 12 months Vitals Screenings to include cognitive, depression, and falls Referrals and appointments  In addition, I have reviewed and discussed with patient certain preventive protocols, quality metrics, and best practice recommendations. A written personalized care plan for preventive services as well as general preventive health recommendations were provided to patient.     Nerissa Bannister, LPN   08/26/1094   After Visit Summary: (MyChart) Due to this being a telephonic visit, the after visit summary with patients personalized plan was offered to patient via MyChart   Notes: Nothing significant to report at this time.

## 2023-12-27 ENCOUNTER — Ambulatory Visit

## 2023-12-27 DIAGNOSIS — R2689 Other abnormalities of gait and mobility: Secondary | ICD-10-CM

## 2023-12-27 DIAGNOSIS — R262 Difficulty in walking, not elsewhere classified: Secondary | ICD-10-CM

## 2023-12-27 DIAGNOSIS — M6281 Muscle weakness (generalized): Secondary | ICD-10-CM

## 2023-12-27 DIAGNOSIS — M25551 Pain in right hip: Secondary | ICD-10-CM

## 2023-12-27 DIAGNOSIS — R269 Unspecified abnormalities of gait and mobility: Secondary | ICD-10-CM | POA: Diagnosis not present

## 2023-12-27 DIAGNOSIS — R278 Other lack of coordination: Secondary | ICD-10-CM

## 2023-12-27 NOTE — Therapy (Signed)
 OUTPATIENT PHYSICAL THERAPY LOWER EXTREMITY TREATMTENT   Patient Name: Sarah Harper MRN: [id number] DOB:04/07/1941, 83 y.o., female Today's Date: 12/27/2023  END OF SESSION:  PT End of Session - 12/27/23 1010     Visit Number 14    Number of Visits 24    Date for PT Re-Evaluation 01/31/24    Progress Note Due on Visit 20    PT Start Time 1005    PT Stop Time 1040    PT Time Calculation (min) 35 min    Equipment Utilized During Treatment Gait belt    Activity Tolerance Patient tolerated treatment well    Behavior During Therapy WFL for tasks assessed/performed                   Past Medical History:  Diagnosis Date   Anxiety    Blood loss anemia 09/13/2023   Cataract 1997   Colon cancer screening 02/05/2018   DDD (degenerative disc disease)    chronic back pain   ETD (eustachian tube dysfunction)    GERD (gastroesophageal reflux disease)    Hypertension 2022   IBS (irritable bowel syndrome)    Meniere's disease    Migraine    still gets visual aura from time to time   Mild cognitive impairment    Osteoporosis    Sleep apnea    CPAP   Stroke (HCC) 2022   Syncopal episodes    after work-up - possible seizures?   Thyroid  disease    hypothyroid   TIA (transient ischemic attack) 11/09/2020   Past Surgical History:  Procedure Laterality Date   APPENDECTOMY     CATARACT EXTRACTION W/PHACO Right 05/21/2019   Procedure: CATARACT EXTRACTION PHACO AND INTRAOCULAR LENS PLACEMENT (IOC) RIGHT panoptix lens  00:35.0  21.7%  7.61;  Surgeon: Annell Kidney, MD;  Location: [facility];  Service: Ophthalmology;  Laterality: Right;  sleep apnea requests early   CATARACT EXTRACTION W/PHACO Left 06/11/2019   Procedure: CATARACT EXTRACTION PHACO AND INTRAOCULAR LENS PLACEMENT (IOC) LEFT PANOPTIX TORIC LENS  00:50.0  20.3%  10.21;  Surgeon: Annell Kidney, MD;  Location: [facility];  Service: Ophthalmology;  Laterality: Left;  sleep  apnea requests early   COLONOSCOPY     last 2012 - [facility]   ESOPHAGOGASTRODUODENOSCOPY     Multiple, last 01/07/2017 with Savary dilation to 18 mm   EYE SURGERY  August 2022   Cataracs removed   HEMORRHOID BANDING  2018   [facility]   INTRAMEDULLARY (IM) NAIL INTERTROCHANTERIC Right 09/06/2023   Procedure: INTRAMEDULLARY (IM) NAIL INTERTROCHANTERIC;  Surgeon: Hardy Lia, MD;  Location: [facility];  Service: Orthopedics;  Laterality: Right;   LUMBAR DISC SURGERY  1984   L5    Patient Active Problem List   Diagnosis Date Noted   Skin lesion of right leg 10/18/2023   Hyponatremia 10/18/2023   Blood loss anemia 09/13/2023   Closed right hip fracture (HCC) 09/05/2023   Abnormal urinalysis 09/02/2023   Acute cystitis 08/21/2023   Current use of proton pump inhibitor 03/13/2023   Pain in joint, lower leg 01/30/2023   Senile purpura (HCC) 12/26/2022   Skin tear of right lower leg without complication 12/26/2022   Dysuria 10/21/2021   Elevated alkaline phosphatase level 03/11/2021   Palpitations 11/10/2020   Migraine    Hypertension, essential    H/O: CVA (cerebrovascular accident) 11/08/2020   Headache 09/26/2019   Fall at home 06/02/2019   Colon cancer screening 02/05/2018   Degenerative joint disease (DJD)  of lumbar spine 05/02/2017   Internal hemorrhoids 05/02/2017   Generalized osteoarthritis of hand 12/11/2016   GAD (generalized anxiety disorder) 12/11/2016   Estrogen deficiency 05/11/2016   Encounter for screening mammogram for breast cancer 05/11/2016   OSA (obstructive sleep apnea) 03/06/2016   Fatigue 01/25/2016   Left knee pain 10/18/2015   Snoring 10/18/2015   Routine general medical examination at a health care facility 02/28/2015   Encounter for Medicare annual wellness exam 02/06/2013   Hyperlipidemia 08/27/2012   Irregular heart beat 08/05/2012   Encounter for gynecological examination 12/20/2010   Osteopenia 12/17/2009   BACK PAIN, LUMBAR 07/02/2009   IRRITABLE  BOWEL SYNDROME 04/02/2009   Asymptomatic postmenopausal status 12/10/2008   Hypothyroidism 12/05/2007   Meniere's disease 12/05/2007   GERD 12/05/2007    PCP: Dr. Deri Fleet  REFERRING PROVIDER: Dr. Hardy Lia  REFERRING DIAG: Right intertrochanteric fracture; Piriformis syndrome  THERAPY DIAG:  Abnormality of gait and mobility  Difficulty in walking, not elsewhere classified  Muscle weakness (generalized)  Other abnormalities of gait and mobility  Pain in right hip  Other lack of coordination  Rationale for Evaluation and Treatment: Rehabilitation  ONSET DATE: 09/05/2023  SUBJECTIVE:   SUBJECTIVE STATEMENT:   Patient reports she was confused about whether to come today so decided to come. Reports ongoing right posterior low back/gluteal pain. States everything is a go for injection procedure tomorrow.   PERTINENT HISTORY: Per hospital H&P from 09/05/2023: Sarah Harper is a 83 year old female with past medical history significant for HTN, hypothyroidism, GERD, osteopenia, dyslipidemia, OSA who presented to [facility] ED on 1/15 fall mechanical fall at home sustaining injury to her right hip with acute pain.  Unable to ambulate.  Workup in the ED notable for acute impacted, angulated, displaced intertrochanteric fracture proximal right femur.  Orthopedics was consulted with plan for operative management on 09/06/2023.  Patient underwent intramedullary nailing of the right hip 09/06/2023 by Dr. Guyann Leitz.  She tested positive for COVID on 09/08/2023 and completed isolation and was discharge to skilled nursing facility.  PAIN:  Are you having pain? Yes: NPRS scale: current 1-2/10; best 0/10 and sleep pretty; up to 8/10 at worst Pain location: R glute region Pain description: ache but sharp at times Aggravating factors: walking on unlevel surfaces Relieving factors: lying down; medication.   PRECAUTIONS: Fall  RED FLAGS: None   WEIGHT BEARING RESTRICTIONS: No  FALLS:  Has  patient fallen in last 6 months? Yes. Number of falls 1  LIVING ENVIRONMENT: Lives with: lives with their spouse Lives in: House/apartment Stairs: Yes: Internal: 2 steps; on right going up and grab bar on left. Has following equipment at home: Single point cane, Walker - 2 wheeled, Grab bars, and elevated toilet  OCCUPATION: Retired -  PLOF: Independent  PATIENT GOALS: I want to walk without my walker  NEXT MD VISIT: 12/12/2023  OBJECTIVE:  Note: Objective measures were completed at Evaluation unless otherwise noted.  DIAGNOSTIC FINDINGS: CLINICAL DATA:  Fall onto right hip.   EXAM: DG HIP (WITH OR WITHOUT PELVIS) 2-3V RIGHT   COMPARISON:  None Available.   FINDINGS: There is an acute impacted intertrochanteric fracture of the proximal right femur with fracture margins extending through the greater and lesser trochanters. There is approximately 1.8 cm of lateral displacement of the distal segment with apex varus angulation. The right femoral head is seated within the acetabulum. Evaluation of the sacrum is limited due to overlying bowel-gas. The sacroiliac joints and pubic symphysis otherwise appear anatomically  aligned.   IMPRESSION: Acute impacted, angulated, and displaced intertrochanteric fracture of the proximal right femur.     Electronically Signed   By: Mannie Seek M.D.   On: 09/05/2023 08:21  PATIENT SURVEYS:  LEFS 22/80  COGNITION: Overall cognitive status: Within functional limits for tasks assessed     SENSATION: WFL  EDEMA:  Wearing compression stocking on left    PALPATION: (+) tenderness along right posterior gluteal region  LOWER EXTREMITY ROM:  Active ROM Right eval Left eval  Hip flexion    Hip extension    Hip abduction    Hip adduction    Hip internal rotation    Hip external rotation    Knee flexion    Knee extension    Ankle dorsiflexion    Ankle plantarflexion    Ankle inversion    Ankle eversion     (Blank rows  = not tested)  LOWER EXTREMITY MMT:  MMT Right eval Left eval  Hip flexion 3+ *pain 4  Hip extension 3+ 4  Hip abduction 3+ *pain 4  Hip adduction    Hip internal rotation 3+ 4  Hip external rotation 3+ 4  Knee flexion 4 4  Knee extension 4 4  Ankle dorsiflexion 4 4  Ankle plantarflexion    Ankle inversion    Ankle eversion     (Blank rows = not tested)  LOWER EXTREMITY SPECIAL TESTS:  To be assessed next visit  FUNCTIONAL TESTS:  5 times sit to stand: 13.98 sec with heavy BUE Support - unable to perform without UE support despite multiple attempts Timed up and go (TUG): 28.14 sec with RW 6 minute walk test: to be assessed visit #2 10 meter walk test: 15.22 and 15.77 sec with RW= 0.65 m/s Berg Balance Scale: To be assessed visit #2  GAIT: Distance walked: approx 100 feet Assistive device utilized: Walker - 2 wheeled Level of assistance: CGA Comments: decreased step length, Increased UE support on walker, antalgic gait                                                                                                                                TREATMENT DATE: 12/27/23    Self care/Home management:  Review of anatomy of bones, ligaments, SI joint , sciatic nerve, and Piriformis. Discussed need to be as specific as possible with MD for successful injection.  Reviewed appropriate exercises and emphasized to only perform activities that do not cause a catching sensation.    TE:  PROM - R knee to chest, Hip rotation, circles, Hamstrings, Lower trunk rotation, piriformis, figure 4- all 30 sec x 3 each LE. Patient reported some right posterior low back/SI region pain- still catching at times.  Manual:  STM to right posterior low back and SI joint region x 8 min             PATIENT EDUCATION:  Education details: Exercise technique Person educated: Patient  and Spouse Education method: Explanation, Demonstration, Tactile cues, and Verbal cues Education  comprehension: verbalized understanding and returned demonstration  HOME EXERCISE PROGRAM: Access Code: [facility] URL: [email or web address] Date: 11/20/2023 Prepared by: Ferrell Hu  Exercises - Supine Bridge  - 3 x weekly - 3 sets - 10 reps - Clamshell with Resistance  - 3 x weekly - 3 sets - 10 reps - Standing Diagonal Hip Extension and External Rotation  - 3 x weekly - 3 sets - 10 reps - Standing Hip Abduction with Counter Support  - 3 x weekly - 3 sets - 10 reps - Standing March with Counter Support  - 3 x weekly - 3 sets - 10 reps     Access Code: [email or web address] URL: [email or web address] Date: 11/13/2023 Prepared by: Ferrell Hu  Exercises - Supine Piriformis Stretch with Foot on Ground  - 1 x daily - 3 sets - 30 sec hold - Seated Piriformis Stretch with Trunk Bend  - 1 x daily - 3 sets - 30 sec hold - Seated Piriformis Release With [employer]  - 1 x daily - 3 sets - 10 reps  ASSESSMENT:  CLINICAL IMPRESSION: Minimal improvement overall with manual techniques and therex in terms of pain today. Patient instructed to rest and go to appointment for injection today and to call if needed to cancel for Tues.  Patient will benefit from skilled PT services to address her right hip pain, muscle weakness, and impaired mobility to restore her to her previous level of independence with improved quality of life and decreased risk of falling.    OBJECTIVE IMPAIRMENTS: Abnormal gait, decreased activity tolerance, decreased balance, decreased coordination, decreased endurance, decreased mobility, difficulty walking, decreased strength, and pain.   ACTIVITY LIMITATIONS: carrying, lifting, bending, standing, squatting, stairs, and transfers  PARTICIPATION LIMITATIONS: meal prep, cleaning, laundry, shopping, community activity, and yard work  PERSONAL FACTORS: Age and 1-2 comorbidities: anxiety, Osteoporosis are also affecting patient's functional  outcome.   REHAB POTENTIAL: Good  CLINICAL DECISION MAKING: Evolving/moderate complexity  EVALUATION COMPLEXITY: Moderate   GOALS: Goals reviewed with patient? Yes  SHORT TERM GOALS: Target date: 12/20/2023 Pt will be independent with HEP in order to improve strength and balance in order to decrease fall risk and improve function at home and work.  Baseline: EVAL: No formal HEP in place; 12/13/2023- Patient reports compliant with HEP to date- States no questions or concerns Goal status: MET   LONG TERM GOALS: Target date: 02/01/2024  Pt will increase LEFS by at least 9 points in order to demonstrate significant improvement in lower extremity function. Baseline: EVAL: 22/80 Goal status: INITIAL  2.  Pt will decrease worst pain as reported on NPRS by at least 3 points in order to demonstrate clinically significant reduction in ankle/foot pain.  Baseline: EVAL= 8/10 right hip pain at worst; 12/13/2023= Right hip 4/10 at worst  Goal status: PROGRESSING  3.  Pt will decrease 5TSTS by at least 3 seconds in order to demonstrate clinically significant improvement in LE strength. Baseline: EVAL 13.98 sec with heavy BUE Support - unable to perform without UE support despite multiple attempts; 12/13/2023- Still unable to stand without UE Support yet did progress to only using 1 UE support today in 19.03 sec.  Goal status: PROGRESSING  4.  Pt will decrease TUG to below 14 seconds/decrease in order to demonstrate decreased fall risk. Baseline: EVAL=28.14 sec with RW; 12/13/2023- 16.15 sec with Hurrycane Goal status: PROGRESSING  5.  Patient will improve gait speed by 0.2  m/s for improved functional mobility and decreased risk of falling. Baseline: EVAL=0.65 m/s with RW; 12/13/2023= 0.74 m/s using hurrycane Goal status: INITIAL  6.  Pt will improve BERG by at least 3 points in order to demonstrate clinically significant improvement in balance.  Baseline: EVAL- To be assessed visit #2; 11/13/2023=  45/56 Goal status: INITIAL  7. Pt will increase (able to complete the test without stopping) and > 800 feet in order to demonstrate clinically significant improvement in cardiopulmonary endurance and community ambulation   Baseline: 11/20/2023= 450 feet in 4 min (Stopped due to pain); 12/13/2023= 880 with cane-with 2/10 right hip pain  Goal status: PROGRESSING   PLAN:  PT FREQUENCY: 1-2x/week  PT DURATION: 12 weeks  PLANNED INTERVENTIONS: 97164- PT Re-evaluation, 97110-Therapeutic exercises, 97530- Therapeutic activity, 97112- Neuromuscular re-education, 97535- Self Care, 16109- Manual therapy, [id number]- Gait training, [phone]- Orthotic Fit/training, [phone]- Canalith repositioning, [phone]- Electrical stimulation (manual), Patient/Family education, Balance training, Stair training, Taping, Dry Needling, Joint mobilization, Spinal mobilization, Scar mobilization, Compression bandaging, Vestibular training, DME instructions, Cryotherapy, and Moist heat  PLAN FOR NEXT SESSION:   Reassess all symptoms post injection Resume/progress Hip strengthening as appropriate based on pain symptoms Resume any manual therapy as appropriate for pain relief.  Ossie Blend, PT Physical Therapist - [facility]  12/27/2023, 11:00 AM

## 2023-12-28 ENCOUNTER — Ambulatory Visit
Admission: RE | Admit: 2023-12-28 | Discharge: 2023-12-28 | Disposition: A | Discharge status: Home / Self Care | Source: Ambulatory Visit | Attending: Orthopedic Surgery | Admitting: Orthopedic Surgery

## 2023-12-28 DIAGNOSIS — M461 Sacroiliitis, not elsewhere classified: Secondary | ICD-10-CM

## 2023-12-28 MED ORDER — IOPAMIDOL (ISOVUE-M 200) INJECTION 41%
1.0000 mL | Freq: Once | INTRAMUSCULAR | Status: AC
  Administered 2023-12-28: 1 mL via INTRA_ARTICULAR

## 2023-12-28 MED ORDER — METHYLPREDNISOLONE ACETATE 40 MG/ML INJ SUSP (RADIOLOG
80.0000 mg | Freq: Once | INTRAMUSCULAR | Status: AC
  Administered 2023-12-28: 80 mg via INTRA_ARTICULAR

## 2024-01-01 ENCOUNTER — Telehealth: Payer: Self-pay

## 2024-01-01 ENCOUNTER — Ambulatory Visit

## 2024-01-01 NOTE — Telephone Encounter (Signed)
 Patient Name: Sarah Harper MRN: [id number] DOB:February 22, 1941, 83 y.o., female Today's Date: 01/01/2024  Called patient as she did appear for her scheduled appointment. She reported still recovering from her injection on Friday. To date not much change. Reviewed patient next scheduled visit on Thurs and she confirmed. Instructed her to call clinic if unable to attend and she verbalized understanding.    Murlene Army, PT 01/01/2024, 10:45 AM

## 2024-01-03 ENCOUNTER — Ambulatory Visit

## 2024-01-08 ENCOUNTER — Ambulatory Visit

## 2024-01-08 DIAGNOSIS — R278 Other lack of coordination: Secondary | ICD-10-CM

## 2024-01-08 DIAGNOSIS — R2689 Other abnormalities of gait and mobility: Secondary | ICD-10-CM

## 2024-01-08 DIAGNOSIS — M6281 Muscle weakness (generalized): Secondary | ICD-10-CM

## 2024-01-08 DIAGNOSIS — R269 Unspecified abnormalities of gait and mobility: Secondary | ICD-10-CM | POA: Diagnosis not present

## 2024-01-08 DIAGNOSIS — M25551 Pain in right hip: Secondary | ICD-10-CM

## 2024-01-08 DIAGNOSIS — R262 Difficulty in walking, not elsewhere classified: Secondary | ICD-10-CM

## 2024-01-08 NOTE — Therapy (Signed)
 OUTPATIENT PHYSICAL THERAPY LOWER EXTREMITY TREATMTENT   Patient Name: Sarah Harper MRN: [id number] DOB:[birth year], 83 y.o., female Today's Date: 01/08/2024  END OF SESSION:  PT End of Session - 01/08/24 1019     Visit Number 15    Number of Visits 24    Date for PT Re-Evaluation 01/31/24    Progress Note Due on Visit 20    PT Start Time 1015    PT Stop Time 1056    PT Time Calculation (min) 41 min    Equipment Utilized During Treatment Gait belt    Activity Tolerance Patient tolerated treatment well    Behavior During Therapy WFL for tasks assessed/performed                   Past Medical History:  Diagnosis Date   Anxiety    Blood loss anemia 09/13/2023   Cataract 1997   Colon cancer screening 02/05/2018   DDD (degenerative disc disease)    chronic back pain   ETD (eustachian tube dysfunction)    GERD (gastroesophageal reflux disease)    Hypertension 2022   IBS (irritable bowel syndrome)    Meniere's disease    Migraine    still gets visual aura from time to time   Mild cognitive impairment    Osteoporosis    Sleep apnea    CPAP   Stroke (HCC) 2022   Syncopal episodes    after work-up - possible seizures?   Thyroid  disease    hypothyroid   TIA (transient ischemic attack) 11/09/2020   Past Surgical History:  Procedure Laterality Date   APPENDECTOMY     CATARACT EXTRACTION W/PHACO Right 05/21/2019   Procedure: CATARACT EXTRACTION PHACO AND INTRAOCULAR LENS PLACEMENT (IOC) RIGHT panoptix lens  00:35.0  21.7%  7.61;  Surgeon: Annell Kidney, MD;  Location: [facility];  Service: Ophthalmology;  Laterality: Right;  sleep apnea requests early   CATARACT EXTRACTION W/PHACO Left 06/11/2019   Procedure: CATARACT EXTRACTION PHACO AND INTRAOCULAR LENS PLACEMENT (IOC) LEFT PANOPTIX TORIC LENS  00:50.0  20.3%  10.21;  Surgeon: Annell Kidney, MD;  Location: [facility];  Service: Ophthalmology;  Laterality: Left;  sleep  apnea requests early   COLONOSCOPY     last 2012 - [facility]   ESOPHAGOGASTRODUODENOSCOPY     Multiple, last 01/07/2017 with Savary dilation to 18 mm   EYE SURGERY  August 2022   Cataracs removed   HEMORRHOID BANDING  2018   [facility]   INTRAMEDULLARY (IM) NAIL INTERTROCHANTERIC Right 09/06/2023   Procedure: INTRAMEDULLARY (IM) NAIL INTERTROCHANTERIC;  Surgeon: Hardy Lia, MD;  Location: [facility];  Service: Orthopedics;  Laterality: Right;   LUMBAR DISC SURGERY  1984   L5    Patient Active Problem List   Diagnosis Date Noted   Skin lesion of right leg 10/18/2023   Hyponatremia 10/18/2023   Blood loss anemia 09/13/2023   Closed right hip fracture (HCC) 09/05/2023   Abnormal urinalysis 09/02/2023   Acute cystitis 08/21/2023   Current use of proton pump inhibitor 03/13/2023   Pain in joint, lower leg 01/30/2023   Senile purpura (HCC) 12/26/2022   Skin tear of right lower leg without complication 12/26/2022   Dysuria 10/21/2021   Elevated alkaline phosphatase level 03/11/2021   Palpitations 11/10/2020   Migraine    Hypertension, essential    H/O: CVA (cerebrovascular accident) 11/08/2020   Headache 09/26/2019   Fall at home 06/02/2019   Colon cancer screening 02/05/2018   Degenerative joint disease (DJD)  of lumbar spine 05/02/2017   Internal hemorrhoids 05/02/2017   Generalized osteoarthritis of hand 12/11/2016   GAD (generalized anxiety disorder) 12/11/2016   Estrogen deficiency 05/11/2016   Encounter for screening mammogram for breast cancer 05/11/2016   OSA (obstructive sleep apnea) 03/06/2016   Fatigue 01/25/2016   Left knee pain 10/18/2015   Snoring 10/18/2015   Routine general medical examination at a health care facility 02/28/2015   Encounter for Medicare annual wellness exam 02/06/2013   Hyperlipidemia 08/27/2012   Irregular heart beat 08/05/2012   Encounter for gynecological examination 12/20/2010   Osteopenia 12/17/2009   BACK PAIN, LUMBAR 07/02/2009   IRRITABLE  BOWEL SYNDROME 04/02/2009   Asymptomatic postmenopausal status 12/10/2008   Hypothyroidism 12/05/2007   Meniere's disease 12/05/2007   GERD 12/05/2007    PCP: Dr. Deri Fleet  REFERRING PROVIDER: Dr. Hardy Lia  REFERRING DIAG: Right intertrochanteric fracture; Piriformis syndrome  THERAPY DIAG:  Abnormality of gait and mobility  Difficulty in walking, not elsewhere classified  Muscle weakness (generalized)  Other abnormalities of gait and mobility  Pain in right hip  Other lack of coordination  Rationale for Evaluation and Treatment: Rehabilitation  ONSET DATE: 09/05/2023  SUBJECTIVE:   SUBJECTIVE STATEMENT:   Patient reports the pain has shifted since she was last seen and since her injection- Not constant - More pain on lateral hip along the incision- but not as much gluteal pain and not catching as much with standing.  My biggest concern is falling at night - using cane mostly but using walker at night to avoid falling.   PERTINENT HISTORY: Per hospital H&P from 09/05/2023: Sarah Harper is a 83 year old female with past medical history significant for HTN, hypothyroidism, GERD, osteopenia, dyslipidemia, OSA who presented to [facility] ED on 1/15 fall mechanical fall at home sustaining injury to her right hip with acute pain.  Unable to ambulate.  Workup in the ED notable for acute impacted, angulated, displaced intertrochanteric fracture proximal right femur.  Orthopedics was consulted with plan for operative management on 09/06/2023.  Patient underwent intramedullary nailing of the right hip 09/06/2023 by Dr. Guyann Leitz.  She tested positive for COVID on 09/08/2023 and completed isolation and was discharge to skilled nursing facility.  PAIN:  Are you having pain? Yes: NPRS scale: current 1-2/10; best 0/10 and sleep pretty; up to 8/10 at worst Pain location: R glute region Pain description: ache but sharp at times Aggravating factors: walking on unlevel surfaces Relieving factors:  lying down; medication.   PRECAUTIONS: Fall  RED FLAGS: None   WEIGHT BEARING RESTRICTIONS: No  FALLS:  Has patient fallen in last 6 months? Yes. Number of falls 1  LIVING ENVIRONMENT: Lives with: lives with their spouse Lives in: House/apartment Stairs: Yes: Internal: 2 steps; on right going up and grab bar on left. Has following equipment at home: Single point cane, Walker - 2 wheeled, Grab bars, and elevated toilet  OCCUPATION: Retired -  PLOF: Independent  PATIENT GOALS: I want to walk without my walker  NEXT MD VISIT: 12/12/2023  OBJECTIVE:  Note: Objective measures were completed at Evaluation unless otherwise noted.  DIAGNOSTIC FINDINGS: CLINICAL DATA:  Fall onto right hip.   EXAM: DG HIP (WITH OR WITHOUT PELVIS) 2-3V RIGHT   COMPARISON:  None Available.   FINDINGS: There is an acute impacted intertrochanteric fracture of the proximal right femur with fracture margins extending through the greater and lesser trochanters. There is approximately 1.8 cm of lateral displacement of the distal segment with apex varus  angulation. The right femoral head is seated within the acetabulum. Evaluation of the sacrum is limited due to overlying bowel-gas. The sacroiliac joints and pubic symphysis otherwise appear anatomically aligned.   IMPRESSION: Acute impacted, angulated, and displaced intertrochanteric fracture of the proximal right femur.     Electronically Signed   By: Mannie Seek M.D.   On: 09/05/2023 08:21  PATIENT SURVEYS:  LEFS 22/80  COGNITION: Overall cognitive status: Within functional limits for tasks assessed     SENSATION: WFL  EDEMA:  Wearing compression stocking on left    PALPATION: (+) tenderness along right posterior gluteal region  LOWER EXTREMITY ROM:  Active ROM Right eval Left eval  Hip flexion    Hip extension    Hip abduction    Hip adduction    Hip internal rotation    Hip external rotation    Knee flexion     Knee extension    Ankle dorsiflexion    Ankle plantarflexion    Ankle inversion    Ankle eversion     (Blank rows = not tested)  LOWER EXTREMITY MMT:  MMT Right eval Left eval  Hip flexion 3+ *pain 4  Hip extension 3+ 4  Hip abduction 3+ *pain 4  Hip adduction    Hip internal rotation 3+ 4  Hip external rotation 3+ 4  Knee flexion 4 4  Knee extension 4 4  Ankle dorsiflexion 4 4  Ankle plantarflexion    Ankle inversion    Ankle eversion     (Blank rows = not tested)  LOWER EXTREMITY SPECIAL TESTS:  To be assessed next visit  FUNCTIONAL TESTS:  5 times sit to stand: 13.98 sec with heavy BUE Support - unable to perform without UE support despite multiple attempts Timed up and go (TUG): 28.14 sec with RW 6 minute walk test: to be assessed visit #2 10 meter walk test: 15.22 and 15.77 sec with RW= 0.65 m/s Berg Balance Scale: To be assessed visit #2  GAIT: Distance walked: approx 100 feet Assistive device utilized: Walker - 2 wheeled Level of assistance: CGA Comments: decreased step length, Increased UE support on walker, antalgic gait                                                                                                                                TREATMENT DATE: 01/08/24     Therapeutic Activities: dynamic therapeutic activities  designed to achieve improved functional performance    - Step tap without UE support (min soreness with weight bearing) 2x 10 reps  - Step up onto 1st step with min UE support x 15 reps - Standing hip ext alt x 15 reps  - Standing side step- up/over 1/2 spike ball x 15 reps - Minisquats x 15 reps BLE -Standing ham curls- x 15 reps Alt LE - Standing calf raises x 20 reps -Attempted hip abd with BTB- x 10 - stopped secondary to lateral  Hip soreness.          PATIENT EDUCATION:  Education details: Exercise technique Person educated: Patient and Spouse Education method: Explanation, Demonstration, Tactile cues, and  Verbal cues Education comprehension: verbalized understanding and returned demonstration  HOME EXERCISE PROGRAM: Access Code: [facility] URL: [email or web address] Date: 11/20/2023 Prepared by: Ferrell Hu  Exercises - Supine Bridge  - 3 x weekly - 3 sets - 10 reps - Clamshell with Resistance  - 3 x weekly - 3 sets - 10 reps - Standing Diagonal Hip Extension and External Rotation  - 3 x weekly - 3 sets - 10 reps - Standing Hip Abduction with Counter Support  - 3 x weekly - 3 sets - 10 reps - Standing March with Counter Support  - 3 x weekly - 3 sets - 10 reps     Access Code: [email or web address] URL: [email or web address] Date: 11/13/2023 Prepared by: Ferrell Hu  Exercises - Supine Piriformis Stretch with Foot on Ground  - 1 x daily - 3 sets - 30 sec hold - Seated Piriformis Stretch with Trunk Bend  - 1 x daily - 3 sets - 30 sec hold - Seated Piriformis Release With [employer]  - 1 x daily - 3 sets - 10 reps  ASSESSMENT:  CLINICAL IMPRESSION: Patient returned to clinic today after taking some time off after recent injection. She presents with pain yet different location than previously- now pain has shifted from R SI/gluteal region into lateral hip pain. She was able to perform and modify activities as pain free as possible. Patient will benefit from skilled PT services to address her right hip pain, muscle weakness, and impaired mobility to restore her to her previous level of independence with improved quality of life and decreased risk of falling.    OBJECTIVE IMPAIRMENTS: Abnormal gait, decreased activity tolerance, decreased balance, decreased coordination, decreased endurance, decreased mobility, difficulty walking, decreased strength, and pain.   ACTIVITY LIMITATIONS: carrying, lifting, bending, standing, squatting, stairs, and transfers  PARTICIPATION LIMITATIONS: meal prep, cleaning, laundry, shopping, community activity, and yard  work  PERSONAL FACTORS: Age and 1-2 comorbidities: anxiety, Osteoporosis are also affecting patient's functional outcome.   REHAB POTENTIAL: Good  CLINICAL DECISION MAKING: Evolving/moderate complexity  EVALUATION COMPLEXITY: Moderate   GOALS: Goals reviewed with patient? Yes  SHORT TERM GOALS: Target date: 12/20/2023 Pt will be independent with HEP in order to improve strength and balance in order to decrease fall risk and improve function at home and work.  Baseline: EVAL: No formal HEP in place; 12/13/2023- Patient reports compliant with HEP to date- States no questions or concerns Goal status: MET   LONG TERM GOALS: Target date: 02/01/2024  Pt will increase LEFS by at least 9 points in order to demonstrate significant improvement in lower extremity function. Baseline: EVAL: 22/80 Goal status: INITIAL  2.  Pt will decrease worst pain as reported on NPRS by at least 3 points in order to demonstrate clinically significant reduction in ankle/foot pain.  Baseline: EVAL= 8/10 right hip pain at worst; 12/13/2023= Right hip 4/10 at worst  Goal status: PROGRESSING  3.  Pt will decrease 5TSTS by at least 3 seconds in order to demonstrate clinically significant improvement in LE strength. Baseline: EVAL 13.98 sec with heavy BUE Support - unable to perform without UE support despite multiple attempts; 12/13/2023- Still unable to stand without UE Support yet did progress to only using 1 UE support today in 19.03 sec.  Goal status: PROGRESSING  4.  Pt will decrease TUG  to below 14 seconds/decrease in order to demonstrate decreased fall risk. Baseline: EVAL=28.14 sec with RW; 12/13/2023- 16.15 sec with Hurrycane Goal status: PROGRESSING  5.  Patient will improve gait speed by 0.2 m/s for improved functional mobility and decreased risk of falling. Baseline: EVAL=0.65 m/s with RW; 12/13/2023= 0.74 m/s using hurrycane Goal status: INITIAL  6.  Pt will improve BERG by at least 3 points in order to  demonstrate clinically significant improvement in balance.  Baseline: EVAL- To be assessed visit #2; 11/13/2023= 45/56 Goal status: INITIAL  7. Pt will increase (able to complete the test without stopping) and > 800 feet in order to demonstrate clinically significant improvement in cardiopulmonary endurance and community ambulation   Baseline: 11/20/2023= 450 feet in 4 min (Stopped due to pain); 12/13/2023= 880 with cane-with 2/10 right hip pain  Goal status: PROGRESSING   PLAN:  PT FREQUENCY: 1-2x/week  PT DURATION: 12 weeks  PLANNED INTERVENTIONS: 97164- PT Re-evaluation, 97110-Therapeutic exercises, 97530- Therapeutic activity, 97112- Neuromuscular re-education, 97535- Self Care, 13086- Manual therapy, [id number]- Gait training, [phone]- Orthotic Fit/training, [phone]- Canalith repositioning, [phone]- Electrical stimulation (manual), Patient/Family education, Balance training, Stair training, Taping, Dry Needling, Joint mobilization, Spinal mobilization, Scar mobilization, Compression bandaging, Vestibular training, DME instructions, Cryotherapy, and Moist heat  PLAN FOR NEXT SESSION:    Continue/progress Hip strengthening as appropriate based on pain symptoms Resume any manual therapy as appropriate for pain relief.  Ossie Blend, PT Physical Therapist - [facility]  01/08/2024, 11:04 AM

## 2024-01-10 ENCOUNTER — Ambulatory Visit

## 2024-01-11 ENCOUNTER — Other Ambulatory Visit: Payer: Self-pay | Admitting: Orthopedic Surgery

## 2024-01-11 DIAGNOSIS — S72141A Displaced intertrochanteric fracture of right femur, initial encounter for closed fracture: Secondary | ICD-10-CM

## 2024-01-11 DIAGNOSIS — M461 Sacroiliitis, not elsewhere classified: Secondary | ICD-10-CM

## 2024-01-15 ENCOUNTER — Ambulatory Visit: Admitting: Primary Care

## 2024-01-15 ENCOUNTER — Ambulatory Visit
Admission: RE | Admit: 2024-01-15 | Discharge: 2024-01-15 | Disposition: A | Discharge status: Home / Self Care | Source: Ambulatory Visit | Attending: Orthopedic Surgery | Admitting: Orthopedic Surgery

## 2024-01-15 ENCOUNTER — Ambulatory Visit: Payer: Self-pay

## 2024-01-15 ENCOUNTER — Ambulatory Visit

## 2024-01-15 ENCOUNTER — Ambulatory Visit: Admitting: Family Medicine

## 2024-01-15 DIAGNOSIS — S72141A Displaced intertrochanteric fracture of right femur, initial encounter for closed fracture: Secondary | ICD-10-CM

## 2024-01-15 DIAGNOSIS — M461 Sacroiliitis, not elsewhere classified: Secondary | ICD-10-CM

## 2024-01-15 NOTE — Telephone Encounter (Signed)
 Hopefully her blood pressure came down  Would someone please check in with her ?  I am out of town this week

## 2024-01-15 NOTE — Telephone Encounter (Signed)
 Aware, I am out of town Thanks for getting her an appointment

## 2024-01-15 NOTE — Therapy (Incomplete)
 OUTPATIENT PHYSICAL THERAPY LOWER EXTREMITY TREATMTENT   Patient Name: Sarah Harper MRN: [id number] DOB:[birth year], 83 y.o., female Today's Date: 01/15/2024  END OF SESSION:          Past Medical History:  Diagnosis Date   Anxiety    Blood loss anemia 09/13/2023   Cataract 1997   Colon cancer screening 02/05/2018   DDD (degenerative disc disease)    chronic back pain   ETD (eustachian tube dysfunction)    GERD (gastroesophageal reflux disease)    Hypertension 2022   IBS (irritable bowel syndrome)    Meniere's disease    Migraine    still gets visual aura from time to time   Mild cognitive impairment    Osteoporosis    Sleep apnea    CPAP   Stroke (HCC) 2022   Syncopal episodes    after work-up - possible seizures?   Thyroid  disease    hypothyroid   TIA (transient ischemic attack) 11/09/2020   Past Surgical History:  Procedure Laterality Date   APPENDECTOMY     CATARACT EXTRACTION W/PHACO Right 05/21/2019   Procedure: CATARACT EXTRACTION PHACO AND INTRAOCULAR LENS PLACEMENT (IOC) RIGHT panoptix lens  00:35.0  21.7%  7.61;  Surgeon: Annell Kidney, MD;  Location: [facility];  Service: Ophthalmology;  Laterality: Right;  sleep apnea requests early   CATARACT EXTRACTION W/PHACO Left 06/11/2019   Procedure: CATARACT EXTRACTION PHACO AND INTRAOCULAR LENS PLACEMENT (IOC) LEFT PANOPTIX TORIC LENS  00:50.0  20.3%  10.21;  Surgeon: Annell Kidney, MD;  Location: [facility];  Service: Ophthalmology;  Laterality: Left;  sleep apnea requests early   COLONOSCOPY     last 2012 - [facility]   ESOPHAGOGASTRODUODENOSCOPY     Multiple, last 01/07/2017 with Savary dilation to 18 mm   EYE SURGERY  August 2022   Cataracs removed   HEMORRHOID BANDING  2018   [facility]   INTRAMEDULLARY (IM) NAIL INTERTROCHANTERIC Right 09/06/2023   Procedure: INTRAMEDULLARY (IM) NAIL INTERTROCHANTERIC;  Surgeon: Hardy Lia, MD;  Location: [facility];  Service: Orthopedics;   Laterality: Right;   LUMBAR DISC SURGERY  1984   L5    Patient Active Problem List   Diagnosis Date Noted   Skin lesion of right leg 10/18/2023   Hyponatremia 10/18/2023   Blood loss anemia 09/13/2023   Closed right hip fracture (HCC) 09/05/2023   Abnormal urinalysis 09/02/2023   Acute cystitis 08/21/2023   Current use of proton pump inhibitor 03/13/2023   Pain in joint, lower leg 01/30/2023   Senile purpura (HCC) 12/26/2022   Skin tear of right lower leg without complication 12/26/2022   Dysuria 10/21/2021   Elevated alkaline phosphatase level 03/11/2021   Palpitations 11/10/2020   Migraine    Hypertension, essential    H/O: CVA (cerebrovascular accident) 11/08/2020   Headache 09/26/2019   Fall at home 06/02/2019   Colon cancer screening 02/05/2018   Degenerative joint disease (DJD) of lumbar spine 05/02/2017   Internal hemorrhoids 05/02/2017   Generalized osteoarthritis of hand 12/11/2016   GAD (generalized anxiety disorder) 12/11/2016   Estrogen deficiency 05/11/2016   Encounter for screening mammogram for breast cancer 05/11/2016   OSA (obstructive sleep apnea) 03/06/2016   Fatigue 01/25/2016   Left knee pain 10/18/2015   Snoring 10/18/2015   Routine general medical examination at a health care facility 02/28/2015   Encounter for Medicare annual wellness exam 02/06/2013   Hyperlipidemia 08/27/2012   Irregular heart beat 08/05/2012   Encounter for gynecological examination 12/20/2010  Osteopenia 12/17/2009   BACK PAIN, LUMBAR 07/02/2009   IRRITABLE BOWEL SYNDROME 04/02/2009   Asymptomatic postmenopausal status 12/10/2008   Hypothyroidism 12/05/2007   Meniere's disease 12/05/2007   GERD 12/05/2007    PCP: Dr. Deri Fleet  REFERRING PROVIDER: Dr. Hardy Lia  REFERRING DIAG: Right intertrochanteric fracture; Piriformis syndrome  THERAPY DIAG:  No diagnosis found.  Rationale for Evaluation and Treatment: Rehabilitation  ONSET DATE:  09/05/2023  SUBJECTIVE:   SUBJECTIVE STATEMENT:   *** Patient reports the pain has shifted since she was last seen and since her injection- Not constant - More pain on lateral hip along the incision- but not as much gluteal pain and not catching as much with standing.  My biggest concern is falling at night - using cane mostly but using walker at night to avoid falling.   PERTINENT HISTORY: Per hospital H&P from 09/05/2023: Sarah Harper is a 83 year old female with past medical history significant for HTN, hypothyroidism, GERD, osteopenia, dyslipidemia, OSA who presented to [facility] ED on 1/15 fall mechanical fall at home sustaining injury to her right hip with acute pain.  Unable to ambulate.  Workup in the ED notable for acute impacted, angulated, displaced intertrochanteric fracture proximal right femur.  Orthopedics was consulted with plan for operative management on 09/06/2023.  Patient underwent intramedullary nailing of the right hip 09/06/2023 by Dr. Guyann Leitz.  She tested positive for COVID on 09/08/2023 and completed isolation and was discharge to skilled nursing facility.  PAIN:  Are you having pain? Yes: NPRS scale: current 1-2/10; best 0/10 and sleep pretty; up to 8/10 at worst Pain location: R glute region Pain description: ache but sharp at times Aggravating factors: walking on unlevel surfaces Relieving factors: lying down; medication.   PRECAUTIONS: Fall  RED FLAGS: None   WEIGHT BEARING RESTRICTIONS: No  FALLS:  Has patient fallen in last 6 months? Yes. Number of falls 1  LIVING ENVIRONMENT: Lives with: lives with their spouse Lives in: House/apartment Stairs: Yes: Internal: 2 steps; on right going up and grab bar on left. Has following equipment at home: Single point cane, Walker - 2 wheeled, Grab bars, and elevated toilet  OCCUPATION: Retired -  PLOF: Independent  PATIENT GOALS: I want to walk without my walker  NEXT MD VISIT: 12/12/2023  OBJECTIVE:  Note: Objective  measures were completed at Evaluation unless otherwise noted.  DIAGNOSTIC FINDINGS: CLINICAL DATA:  Fall onto right hip.   EXAM: DG HIP (WITH OR WITHOUT PELVIS) 2-3V RIGHT   COMPARISON:  None Available.   FINDINGS: There is an acute impacted intertrochanteric fracture of the proximal right femur with fracture margins extending through the greater and lesser trochanters. There is approximately 1.8 cm of lateral displacement of the distal segment with apex varus angulation. The right femoral head is seated within the acetabulum. Evaluation of the sacrum is limited due to overlying bowel-gas. The sacroiliac joints and pubic symphysis otherwise appear anatomically aligned.   IMPRESSION: Acute impacted, angulated, and displaced intertrochanteric fracture of the proximal right femur.     Electronically Signed   By: Mannie Seek M.D.   On: 09/05/2023 08:21  PATIENT SURVEYS:  LEFS 22/80  COGNITION: Overall cognitive status: Within functional limits for tasks assessed     SENSATION: WFL  EDEMA:  Wearing compression stocking on left    PALPATION: (+) tenderness along right posterior gluteal region  LOWER EXTREMITY ROM:  Active ROM Right eval Left eval  Hip flexion    Hip extension    Hip  abduction    Hip adduction    Hip internal rotation    Hip external rotation    Knee flexion    Knee extension    Ankle dorsiflexion    Ankle plantarflexion    Ankle inversion    Ankle eversion     (Blank rows = not tested)  LOWER EXTREMITY MMT:  MMT Right eval Left eval  Hip flexion 3+ *pain 4  Hip extension 3+ 4  Hip abduction 3+ *pain 4  Hip adduction    Hip internal rotation 3+ 4  Hip external rotation 3+ 4  Knee flexion 4 4  Knee extension 4 4  Ankle dorsiflexion 4 4  Ankle plantarflexion    Ankle inversion    Ankle eversion     (Blank rows = not tested)  LOWER EXTREMITY SPECIAL TESTS:  To be assessed next visit  FUNCTIONAL TESTS:  5 times sit to  stand: 13.98 sec with heavy BUE Support - unable to perform without UE support despite multiple attempts Timed up and go (TUG): 28.14 sec with RW 6 minute walk test: to be assessed visit #2 10 meter walk test: 15.22 and 15.77 sec with RW= 0.65 m/s Berg Balance Scale: To be assessed visit #2  GAIT: Distance walked: approx 100 feet Assistive device utilized: Walker - 2 wheeled Level of assistance: CGA Comments: decreased step length, Increased UE support on walker, antalgic gait                                                                                                                                TREATMENT DATE: 01/15/24     Therapeutic Activities: dynamic therapeutic activities  designed to achieve improved functional performance    - Step tap without UE support (min soreness with weight bearing) 2x 10 reps  - Step up onto 1st step with min UE support x 15 reps - Standing hip ext alt x 15 reps  - Standing side step- up/over 1/2 spike ball x 15 reps - Minisquats x 15 reps BLE -Standing ham curls- x 15 reps Alt LE - Standing calf raises x 20 reps -Attempted hip abd with BTB- x 10 - stopped secondary to lateral Hip soreness.          PATIENT EDUCATION:  Education details: Exercise technique Person educated: Patient and Spouse Education method: Explanation, Demonstration, Tactile cues, and Verbal cues Education comprehension: verbalized understanding and returned demonstration  HOME EXERCISE PROGRAM: Access Code: [facility] URL: [email or web address] Date: 11/20/2023 Prepared by: Ferrell Hu  Exercises - Supine Bridge  - 3 x weekly - 3 sets - 10 reps - Clamshell with Resistance  - 3 x weekly - 3 sets - 10 reps - Standing Diagonal Hip Extension and External Rotation  - 3 x weekly - 3 sets - 10 reps - Standing Hip Abduction with Counter Support  - 3 x weekly - 3 sets - 10 reps - Standing March with Counter Support  -  3 x weekly - 3 sets - 10  reps     Access Code: [email or web address] URL: [email or web address] Date: 11/13/2023 Prepared by: Ferrell Hu  Exercises - Supine Piriformis Stretch with Foot on Ground  - 1 x daily - 3 sets - 30 sec hold - Seated Piriformis Stretch with Trunk Bend  - 1 x daily - 3 sets - 30 sec hold - Seated Piriformis Release With [employer]  - 1 x daily - 3 sets - 10 reps  ASSESSMENT:  CLINICAL IMPRESSION: Patient returned to clinic today after taking some time off after recent injection. She presents with pain yet different location than previously- now pain has shifted from R SI/gluteal region into lateral hip pain. She was able to perform and modify activities as pain free as possible. Patient will benefit from skilled PT services to address her right hip pain, muscle weakness, and impaired mobility to restore her to her previous level of independence with improved quality of life and decreased risk of falling.    OBJECTIVE IMPAIRMENTS: Abnormal gait, decreased activity tolerance, decreased balance, decreased coordination, decreased endurance, decreased mobility, difficulty walking, decreased strength, and pain.   ACTIVITY LIMITATIONS: carrying, lifting, bending, standing, squatting, stairs, and transfers  PARTICIPATION LIMITATIONS: meal prep, cleaning, laundry, shopping, community activity, and yard work  PERSONAL FACTORS: Age and 1-2 comorbidities: anxiety, Osteoporosis are also affecting patient's functional outcome.   REHAB POTENTIAL: Good  CLINICAL DECISION MAKING: Evolving/moderate complexity  EVALUATION COMPLEXITY: Moderate   GOALS: Goals reviewed with patient? Yes  SHORT TERM GOALS: Target date: 12/20/2023 Pt will be independent with HEP in order to improve strength and balance in order to decrease fall risk and improve function at home and work.  Baseline: EVAL: No formal HEP in place; 12/13/2023- Patient reports compliant with HEP to date- States no questions or  concerns Goal status: MET   LONG TERM GOALS: Target date: 02/01/2024  Pt will increase LEFS by at least 9 points in order to demonstrate significant improvement in lower extremity function. Baseline: EVAL: 22/80 Goal status: INITIAL  2.  Pt will decrease worst pain as reported on NPRS by at least 3 points in order to demonstrate clinically significant reduction in ankle/foot pain.  Baseline: EVAL= 8/10 right hip pain at worst; 12/13/2023= Right hip 4/10 at worst  Goal status: PROGRESSING  3.  Pt will decrease 5TSTS by at least 3 seconds in order to demonstrate clinically significant improvement in LE strength. Baseline: EVAL 13.98 sec with heavy BUE Support - unable to perform without UE support despite multiple attempts; 12/13/2023- Still unable to stand without UE Support yet did progress to only using 1 UE support today in 19.03 sec.  Goal status: PROGRESSING  4.  Pt will decrease TUG to below 14 seconds/decrease in order to demonstrate decreased fall risk. Baseline: EVAL=28.14 sec with RW; 12/13/2023- 16.15 sec with Hurrycane Goal status: PROGRESSING  5.  Patient will improve gait speed by 0.2 m/s for improved functional mobility and decreased risk of falling. Baseline: EVAL=0.65 m/s with RW; 12/13/2023= 0.74 m/s using hurrycane Goal status: INITIAL  6.  Pt will improve BERG by at least 3 points in order to demonstrate clinically significant improvement in balance.  Baseline: EVAL- To be assessed visit #2; 11/13/2023= 45/56 Goal status: INITIAL  7. Pt will increase (able to complete the test without stopping) and > 800 feet in order to demonstrate clinically significant improvement in cardiopulmonary endurance and community ambulation   Baseline: 11/20/2023= 450 feet in 4 min (  Stopped due to pain); 12/13/2023= 880 with cane-with 2/10 right hip pain  Goal status: PROGRESSING   PLAN:  PT FREQUENCY: 1-2x/week  PT DURATION: 12 weeks  PLANNED INTERVENTIONS: [id number]- PT  Re-evaluation, 97110-Therapeutic exercises, 97530- Therapeutic activity, 97112- Neuromuscular re-education, 97535- Self Care, 40981- Manual therapy, [phone]- Gait training, [phone]- Orthotic Fit/training, [phone]- Canalith repositioning, [phone]- Electrical stimulation (manual), Patient/Family education, Balance training, Stair training, Taping, Dry Needling, Joint mobilization, Spinal mobilization, Scar mobilization, Compression bandaging, Vestibular training, DME instructions, Cryotherapy, and Moist heat  PLAN FOR NEXT SESSION:    Continue/progress Hip strengthening as appropriate based on pain symptoms Resume any manual therapy as appropriate for pain relief.  Ossie Blend, PT Physical Therapist - [facility]  01/15/2024, 7:13 AM

## 2024-01-15 NOTE — Telephone Encounter (Signed)
 Patient cancelled appointment with Dr. Cherlyn Cornet today.  FYI to Dr. Malissa Se.

## 2024-01-15 NOTE — Telephone Encounter (Signed)
 Chief Complaint: HTN  Symptoms: intermittent headaches  Frequency: approximately 3 weeks Pertinent Negatives: Patient denies chest pain, new weakness/numbness in extremities or face Disposition: [] ED /[] Urgent Care (no appt availability in office) / [x] Appointment(In office/virtual)/ []  [place] [facility]/ [] Home Care/ [] Refused Recommended Disposition /[] [place] [facility]/ []  Follow-up with PCP Additional Notes: Patient called in stating she would like to be seen for her BP. Patient received a steroid injection on the 5th of May for her arthritis in her hip, and has been having high bp since this injection, despite her current medication regimen of Amlodopine 2.5 mg daily and Metoprolol  12.5 mg daily. Patient has been self-increasing her Metoprolol  to a full tablet twice a day to help her BP, that has been getting up into the 180s. Patient appt for today for evaluation.    Copied from CRM [id number]. Topic: Clinical - Red Word Triage >> Jan 15, 2024  7:51 AM Alyse July wrote: Red Word that prompted transfer to Nurse Triage: Possible allergic reaction to injection & elevated blood pressure Reason for Disposition  Systolic BP  >= 160 OR Diastolic >= 100    High BP  Answer Assessment - Initial Assessment Questions 1. BLOOD PRESSURE: "What is the blood pressure?" "Did you take at least two measurements 5 minutes apart?"     140/87 - Heartrate 68 = taken at 0730      Last night = 138/85 but she had to increase her BP meds 2. ONSET: "When did you take your blood pressure?"     May 5 - since she received a steroid injection for arthritis pain in hip 3. HOW: "How did you take your blood pressure?" (e.g., automatic home BP monitor, visiting nurse)     Automatic cuff at home 4. HISTORY: "Do you have a history of high blood pressure?"     Yes 5. MEDICINES: "Are you taking any medicines for blood pressure?" "Have you missed any doses recently?"     Amlodopine 2.5 mg daily, Metoprolol  (has  been increasing up to a whole tablet twice a day to control htn) 6. OTHER SYMPTOMS: "Do you have any symptoms?" (e.g., blurred vision, chest pain, difficulty breathing, headache, weakness)     Intermittent sharp pain through head  Protocols used: Blood Pressure - High-A-AH

## 2024-01-16 NOTE — Telephone Encounter (Signed)
 Copied from CRM [id number]. Topic: General - Other >> Jan 16, 2024  3:13 PM Dewanda Foots wrote: Reason for CRM: Patient is calling back to return phone call to [place]. Called the office and [place] was gone for the day. Let the patient know that we will have her call her back as soon as possible.  Callback number is [phone]

## 2024-01-16 NOTE — Telephone Encounter (Signed)
 Left VM requesting pt to call the office back

## 2024-01-16 NOTE — Therapy (Incomplete)
 OUTPATIENT PHYSICAL THERAPY LOWER EXTREMITY TREATMTENT   Patient Name: Sarah Harper MRN: [id number] DOB:[birth year], 83 y.o., female Today's Date: 01/16/2024  END OF SESSION:          Past Medical History:  Diagnosis Date   Anxiety    Blood loss anemia 09/13/2023   Cataract 1997   Colon cancer screening 02/05/2018   DDD (degenerative disc disease)    chronic back pain   ETD (eustachian tube dysfunction)    GERD (gastroesophageal reflux disease)    Hypertension 2022   IBS (irritable bowel syndrome)    Meniere's disease    Migraine    still gets visual aura from time to time   Mild cognitive impairment    Osteoporosis    Sleep apnea    CPAP   Stroke (HCC) 2022   Syncopal episodes    after work-up - possible seizures?   Thyroid  disease    hypothyroid   TIA (transient ischemic attack) 11/09/2020   Past Surgical History:  Procedure Laterality Date   APPENDECTOMY     CATARACT EXTRACTION W/PHACO Right 05/21/2019   Procedure: CATARACT EXTRACTION PHACO AND INTRAOCULAR LENS PLACEMENT (IOC) RIGHT panoptix lens  00:35.0  21.7%  7.61;  Surgeon: Annell Kidney, MD;  Location: [facility];  Service: Ophthalmology;  Laterality: Right;  sleep apnea requests early   CATARACT EXTRACTION W/PHACO Left 06/11/2019   Procedure: CATARACT EXTRACTION PHACO AND INTRAOCULAR LENS PLACEMENT (IOC) LEFT PANOPTIX TORIC LENS  00:50.0  20.3%  10.21;  Surgeon: Annell Kidney, MD;  Location: [facility];  Service: Ophthalmology;  Laterality: Left;  sleep apnea requests early   COLONOSCOPY     last 2012 - [facility]   ESOPHAGOGASTRODUODENOSCOPY     Multiple, last 01/07/2017 with Savary dilation to 18 mm   EYE SURGERY  August 2022   Cataracs removed   HEMORRHOID BANDING  2018   [facility]   INTRAMEDULLARY (IM) NAIL INTERTROCHANTERIC Right 09/06/2023   Procedure: INTRAMEDULLARY (IM) NAIL INTERTROCHANTERIC;  Surgeon: Hardy Lia, MD;  Location: [facility];  Service: Orthopedics;   Laterality: Right;   LUMBAR DISC SURGERY  1984   L5    Patient Active Problem List   Diagnosis Date Noted   Skin lesion of right leg 10/18/2023   Hyponatremia 10/18/2023   Blood loss anemia 09/13/2023   Closed right hip fracture (HCC) 09/05/2023   Abnormal urinalysis 09/02/2023   Acute cystitis 08/21/2023   Current use of proton pump inhibitor 03/13/2023   Pain in joint, lower leg 01/30/2023   Senile purpura (HCC) 12/26/2022   Skin tear of right lower leg without complication 12/26/2022   Dysuria 10/21/2021   Elevated alkaline phosphatase level 03/11/2021   Palpitations 11/10/2020   Migraine    Hypertension, essential    H/O: CVA (cerebrovascular accident) 11/08/2020   Headache 09/26/2019   Fall at home 06/02/2019   Colon cancer screening 02/05/2018   Degenerative joint disease (DJD) of lumbar spine 05/02/2017   Internal hemorrhoids 05/02/2017   Generalized osteoarthritis of hand 12/11/2016   GAD (generalized anxiety disorder) 12/11/2016   Estrogen deficiency 05/11/2016   Encounter for screening mammogram for breast cancer 05/11/2016   OSA (obstructive sleep apnea) 03/06/2016   Fatigue 01/25/2016   Left knee pain 10/18/2015   Snoring 10/18/2015   Routine general medical examination at a health care facility 02/28/2015   Encounter for Medicare annual wellness exam 02/06/2013   Hyperlipidemia 08/27/2012   Irregular heart beat 08/05/2012   Encounter for gynecological examination 12/20/2010  Osteopenia 12/17/2009   BACK PAIN, LUMBAR 07/02/2009   IRRITABLE BOWEL SYNDROME 04/02/2009   Asymptomatic postmenopausal status 12/10/2008   Hypothyroidism 12/05/2007   Meniere's disease 12/05/2007   GERD 12/05/2007    PCP: Dr. Deri Fleet  REFERRING PROVIDER: Dr. Hardy Lia  REFERRING DIAG: Right intertrochanteric fracture; Piriformis syndrome  THERAPY DIAG:  No diagnosis found.  Rationale for Evaluation and Treatment: Rehabilitation  ONSET DATE:  09/05/2023  SUBJECTIVE:   SUBJECTIVE STATEMENT:   *** Patient reports the pain has shifted since she was last seen and since her injection- Not constant - More pain on lateral hip along the incision- but not as much gluteal pain and not catching as much with standing.  My biggest concern is falling at night - using cane mostly but using walker at night to avoid falling.   PERTINENT HISTORY: Per hospital H&P from 09/05/2023: TAMIKKA PILGER is a 83 year old female with past medical history significant for HTN, hypothyroidism, GERD, osteopenia, dyslipidemia, OSA who presented to [facility] ED on 1/15 fall mechanical fall at home sustaining injury to her right hip with acute pain.  Unable to ambulate.  Workup in the ED notable for acute impacted, angulated, displaced intertrochanteric fracture proximal right femur.  Orthopedics was consulted with plan for operative management on 09/06/2023.  Patient underwent intramedullary nailing of the right hip 09/06/2023 by Dr. Guyann Leitz.  She tested positive for COVID on 09/08/2023 and completed isolation and was discharge to skilled nursing facility.  PAIN:  Are you having pain? Yes: NPRS scale: current 1-2/10; best 0/10 and sleep pretty; up to 8/10 at worst Pain location: R glute region Pain description: ache but sharp at times Aggravating factors: walking on unlevel surfaces Relieving factors: lying down; medication.   PRECAUTIONS: Fall  RED FLAGS: None   WEIGHT BEARING RESTRICTIONS: No  FALLS:  Has patient fallen in last 6 months? Yes. Number of falls 1  LIVING ENVIRONMENT: Lives with: lives with their spouse Lives in: House/apartment Stairs: Yes: Internal: 2 steps; on right going up and grab bar on left. Has following equipment at home: Single point cane, Walker - 2 wheeled, Grab bars, and elevated toilet  OCCUPATION: Retired -  PLOF: Independent  PATIENT GOALS: I want to walk without my walker  NEXT MD VISIT: 12/12/2023  OBJECTIVE:  Note: Objective  measures were completed at Evaluation unless otherwise noted.  DIAGNOSTIC FINDINGS: CLINICAL DATA:  Fall onto right hip.   EXAM: DG HIP (WITH OR WITHOUT PELVIS) 2-3V RIGHT   COMPARISON:  None Available.   FINDINGS: There is an acute impacted intertrochanteric fracture of the proximal right femur with fracture margins extending through the greater and lesser trochanters. There is approximately 1.8 cm of lateral displacement of the distal segment with apex varus angulation. The right femoral head is seated within the acetabulum. Evaluation of the sacrum is limited due to overlying bowel-gas. The sacroiliac joints and pubic symphysis otherwise appear anatomically aligned.   IMPRESSION: Acute impacted, angulated, and displaced intertrochanteric fracture of the proximal right femur.     Electronically Signed   By: Mannie Seek M.D.   On: 09/05/2023 08:21  PATIENT SURVEYS:  LEFS 22/80  COGNITION: Overall cognitive status: Within functional limits for tasks assessed     SENSATION: WFL  EDEMA:  Wearing compression stocking on left    PALPATION: (+) tenderness along right posterior gluteal region  LOWER EXTREMITY ROM:  Active ROM Right eval Left eval  Hip flexion    Hip extension    Hip  abduction    Hip adduction    Hip internal rotation    Hip external rotation    Knee flexion    Knee extension    Ankle dorsiflexion    Ankle plantarflexion    Ankle inversion    Ankle eversion     (Blank rows = not tested)  LOWER EXTREMITY MMT:  MMT Right eval Left eval  Hip flexion 3+ *pain 4  Hip extension 3+ 4  Hip abduction 3+ *pain 4  Hip adduction    Hip internal rotation 3+ 4  Hip external rotation 3+ 4  Knee flexion 4 4  Knee extension 4 4  Ankle dorsiflexion 4 4  Ankle plantarflexion    Ankle inversion    Ankle eversion     (Blank rows = not tested)  LOWER EXTREMITY SPECIAL TESTS:  To be assessed next visit  FUNCTIONAL TESTS:  5 times sit to  stand: 13.98 sec with heavy BUE Support - unable to perform without UE support despite multiple attempts Timed up and go (TUG): 28.14 sec with RW 6 minute walk test: to be assessed visit #2 10 meter walk test: 15.22 and 15.77 sec with RW= 0.65 m/s Berg Balance Scale: To be assessed visit #2  GAIT: Distance walked: approx 100 feet Assistive device utilized: Walker - 2 wheeled Level of assistance: CGA Comments: decreased step length, Increased UE support on walker, antalgic gait                                                                                                                                TREATMENT DATE: 01/16/24     Therapeutic Activities: dynamic therapeutic activities  designed to achieve improved functional performance    - Step tap without UE support (min soreness with weight bearing) 2x 10 reps  - Step up onto 1st step with min UE support x 15 reps - Standing hip ext alt x 15 reps  - Standing side step- up/over 1/2 spike ball x 15 reps - Minisquats x 15 reps BLE -Standing ham curls- x 15 reps Alt LE - Standing calf raises x 20 reps -Attempted hip abd with BTB- x 10 - stopped secondary to lateral Hip soreness.          PATIENT EDUCATION:  Education details: Exercise technique Person educated: Patient and Spouse Education method: Explanation, Demonstration, Tactile cues, and Verbal cues Education comprehension: verbalized understanding and returned demonstration  HOME EXERCISE PROGRAM: Access Code: [facility] URL: [email or web address] Date: 11/20/2023 Prepared by: Ferrell Hu  Exercises - Supine Bridge  - 3 x weekly - 3 sets - 10 reps - Clamshell with Resistance  - 3 x weekly - 3 sets - 10 reps - Standing Diagonal Hip Extension and External Rotation  - 3 x weekly - 3 sets - 10 reps - Standing Hip Abduction with Counter Support  - 3 x weekly - 3 sets - 10 reps - Standing March with Counter Support  -  3 x weekly - 3 sets - 10  reps     Access Code: [email or web address] URL: [email or web address] Date: 11/13/2023 Prepared by: Ferrell Hu  Exercises - Supine Piriformis Stretch with Foot on Ground  - 1 x daily - 3 sets - 30 sec hold - Seated Piriformis Stretch with Trunk Bend  - 1 x daily - 3 sets - 30 sec hold - Seated Piriformis Release With [employer]  - 1 x daily - 3 sets - 10 reps  ASSESSMENT:  CLINICAL IMPRESSION: Patient returned to clinic today after taking some time off after recent injection. She presents with pain yet different location than previously- now pain has shifted from R SI/gluteal region into lateral hip pain. She was able to perform and modify activities as pain free as possible. Patient will benefit from skilled PT services to address her right hip pain, muscle weakness, and impaired mobility to restore her to her previous level of independence with improved quality of life and decreased risk of falling.    OBJECTIVE IMPAIRMENTS: Abnormal gait, decreased activity tolerance, decreased balance, decreased coordination, decreased endurance, decreased mobility, difficulty walking, decreased strength, and pain.   ACTIVITY LIMITATIONS: carrying, lifting, bending, standing, squatting, stairs, and transfers  PARTICIPATION LIMITATIONS: meal prep, cleaning, laundry, shopping, community activity, and yard work  PERSONAL FACTORS: Age and 1-2 comorbidities: anxiety, Osteoporosis are also affecting patient's functional outcome.   REHAB POTENTIAL: Good  CLINICAL DECISION MAKING: Evolving/moderate complexity  EVALUATION COMPLEXITY: Moderate   GOALS: Goals reviewed with patient? Yes  SHORT TERM GOALS: Target date: 12/20/2023 Pt will be independent with HEP in order to improve strength and balance in order to decrease fall risk and improve function at home and work.  Baseline: EVAL: No formal HEP in place; 12/13/2023- Patient reports compliant with HEP to date- States no questions or  concerns Goal status: MET   LONG TERM GOALS: Target date: 02/01/2024  Pt will increase LEFS by at least 9 points in order to demonstrate significant improvement in lower extremity function. Baseline: EVAL: 22/80 Goal status: INITIAL  2.  Pt will decrease worst pain as reported on NPRS by at least 3 points in order to demonstrate clinically significant reduction in ankle/foot pain.  Baseline: EVAL= 8/10 right hip pain at worst; 12/13/2023= Right hip 4/10 at worst  Goal status: PROGRESSING  3.  Pt will decrease 5TSTS by at least 3 seconds in order to demonstrate clinically significant improvement in LE strength. Baseline: EVAL 13.98 sec with heavy BUE Support - unable to perform without UE support despite multiple attempts; 12/13/2023- Still unable to stand without UE Support yet did progress to only using 1 UE support today in 19.03 sec.  Goal status: PROGRESSING  4.  Pt will decrease TUG to below 14 seconds/decrease in order to demonstrate decreased fall risk. Baseline: EVAL=28.14 sec with RW; 12/13/2023- 16.15 sec with Hurrycane Goal status: PROGRESSING  5.  Patient will improve gait speed by 0.2 m/s for improved functional mobility and decreased risk of falling. Baseline: EVAL=0.65 m/s with RW; 12/13/2023= 0.74 m/s using hurrycane Goal status: INITIAL  6.  Pt will improve BERG by at least 3 points in order to demonstrate clinically significant improvement in balance.  Baseline: EVAL- To be assessed visit #2; 11/13/2023= 45/56 Goal status: INITIAL  7. Pt will increase (able to complete the test without stopping) and > 800 feet in order to demonstrate clinically significant improvement in cardiopulmonary endurance and community ambulation   Baseline: 11/20/2023= 450 feet in 4 min (  Stopped due to pain); 12/13/2023= 880 with cane-with 2/10 right hip pain  Goal status: PROGRESSING   PLAN:  PT FREQUENCY: 1-2x/week  PT DURATION: 12 weeks  PLANNED INTERVENTIONS: [id number]- PT  Re-evaluation, 97110-Therapeutic exercises, 97530- Therapeutic activity, 97112- Neuromuscular re-education, 97535- Self Care, [id number]- Manual therapy, [phone]- Gait training, [phone]- Orthotic Fit/training, [phone]- Canalith repositioning, [phone]- Electrical stimulation (manual), Patient/Family education, Balance training, Stair training, Taping, Dry Needling, Joint mobilization, Spinal mobilization, Scar mobilization, Compression bandaging, Vestibular training, DME instructions, Cryotherapy, and Moist heat  PLAN FOR NEXT SESSION:    Continue/progress Hip strengthening as appropriate based on pain symptoms Resume any manual therapy as appropriate for pain relief.  Ossie Blend, PT Physical Therapist - [place]  [facility]  01/16/2024, 1:10 PM

## 2024-01-17 ENCOUNTER — Ambulatory Visit

## 2024-01-18 NOTE — Telephone Encounter (Signed)
 The metoprolol  does also lower pulse rate so that needs to be watched  A pulse under 50 could be problematic   I will see her Monday as planned

## 2024-01-18 NOTE — Telephone Encounter (Signed)
 Called pt back and she said she cancelled appt because Triage nurse answered all her questions she had regarding her BP however pt said her BP has still been high so she decided to take a whole pill of metoprolol  on days it's high. Her directions on the metoprolol  now are 1/2 tab BID but some days she would take a whole tab in the am and 1/2 tab in Pm if BP is hight. Pt said some days her BP is okay but her diastolic # is still ranging from 80s-90s. Pt said once morning her BP was 157/94 pulse 57 so she took a whole pill that day. Pt also thinks some anxiety is still playing a role in her BP also. Given BP is still elevated and pt is trying to self manage her meds I scheduled a f/u appt with PCP on Monday at 3pm. ER precautions given.  Pt ask if she should keep managing her BP meds as prev stated until appt or does Dr. Malissa Se want her to do something differently

## 2024-01-18 NOTE — Telephone Encounter (Signed)
Pt notified of Dr. Tower's comments and verbalized understanding  

## 2024-01-21 ENCOUNTER — Encounter: Payer: Self-pay | Admitting: Family Medicine

## 2024-01-21 ENCOUNTER — Ambulatory Visit (INDEPENDENT_AMBULATORY_CARE_PROVIDER_SITE_OTHER): Admitting: Family Medicine

## 2024-01-21 VITALS — BP 160/80 | HR 64 | Temp 98.3°F | Ht 64.0 in | Wt 126.4 lb

## 2024-01-21 DIAGNOSIS — F411 Generalized anxiety disorder: Secondary | ICD-10-CM | POA: Diagnosis not present

## 2024-01-21 DIAGNOSIS — I10 Essential (primary) hypertension: Secondary | ICD-10-CM

## 2024-01-21 MED ORDER — DULOXETINE HCL 30 MG PO CPEP
30.0000 mg | ORAL_CAPSULE | Freq: Every day | ORAL | 1 refills | Status: AC
Start: 2024-01-21 — End: ?

## 2024-01-21 MED ORDER — AMLODIPINE BESYLATE 5 MG PO TABS: ORAL_TABLET | ORAL | 0 refills | Status: DC

## 2024-01-21 NOTE — Assessment & Plan Note (Addendum)
 Blood pressure is up  BP: (!) 160/80   Pt suspects worse due to chronic hip pain, anxiety and lack of sleep  Would like to treat mood before advancing HTN medication  Continues  Amlodipine  2.5 mg daily  Metoprolol  12.5 mg bid

## 2024-01-21 NOTE — Progress Notes (Signed)
 Subjective:    Patient ID: Sarah Harper, female    DOB: 05/22/1941, 83 y.o.   MRN: [id number]  HPI  Wt Readings from Last 3 Encounters:  01/21/24 126 lb 6 oz (57.3 kg)  12/26/23 128 lb (58.1 kg)  12/13/23 128 lb (58.1 kg)   21.69 kg/m  Vitals:   01/21/24 1452 01/21/24 1525  BP: (!) 166/92 (!) 160/80  Pulse: 64   Temp: 98.3 F (36.8 C)   SpO2: 95%    Pt presents for  Elevated blood pressure  Anxious mood    Had a steroid injection in her spine for pain (around may 9th)  Did not help a lot  Thinks it made her blood pressure go up   Pain continued  This made her more anx  Is in PT  Had CT scan of her surgical area - there is a non healing area around hardware   No plan yet for this-waiting to hear back from the ortho team re: next steps  Uses tylenol  for pain      In past CBD helped anxiety  Went back on it and did not feel like it helped   Has never done talk therapy in the past   Not able to do things that she usually does    HTN bp is stable today  No cp or palpitations or headaches or edema  No side effects to medicines  BP Readings from Last 3 Encounters:  01/21/24 (!) 160/80  12/13/23 136/80  10/18/23 134/70     Amlodipine  2.5 mg daily  Metoprolol  12.5 mg bid  Pulse Readings from Last 3 Encounters:  01/21/24 64  12/13/23 65  10/18/23 71   Lab Results  Component Value Date   NA 139 12/13/2023   K 3.8 12/13/2023   CO2 29 12/13/2023   GLUCOSE 80 12/13/2023   BUN 18 12/13/2023   CREATININE 0.70 12/13/2023   CALCIUM  9.3 12/13/2023   GFR 80.49 12/13/2023   EGFR 82 12/08/2020   GFRNONAA >60 09/17/2023   Lab Results  Component Value Date   TSH 0.89 12/13/2023  Levothyroxine  75 mcg daily    Lab Results  Component Value Date   ALT 14 12/13/2023   AST 19 12/13/2023   ALKPHOS 107 12/13/2023   BILITOT 0.5 12/13/2023    Mood      01/21/2024    3:52 PM 12/26/2023    1:53 PM 08/08/2023    9:52 AM 03/20/2023    9:01 AM 12/26/2022     2:05 PM  Depression screen PHQ 2/9  Decreased Interest 2 0 0 0 0  Down, Depressed, Hopeless 2 0 0 0 0  PHQ - 2 Score 4 0 0 0 0  Altered sleeping 3  0 0 0  Tired, decreased energy 3  3 1 2   Change in appetite 0  0 0 0  Feeling bad or failure about yourself  1  0 0 0  Trouble concentrating 1  0 0 0  Moving slowly or fidgety/restless 0  0 0 0  Suicidal thoughts 0  0 0 0  PHQ-9 Score 12  3 1 2   Difficult doing work/chores Somewhat difficult  Not difficult at all Not difficult at all Somewhat difficult      01/21/2024    3:52 PM 08/08/2023    9:53 AM 12/26/2022    2:05 PM  GAD 7 : Generalized Anxiety Score  Nervous, Anxious, on Edge 2 1 1   Control/stop worrying 1  0 0  Worry too much - different things 1 1 1   Trouble relaxing 1 0 0  Restless 0 0 0  Easily annoyed or irritable 1 1 0  Afraid - awful might happen 0 0 0  Total GAD 7 Score 6 3 2   Anxiety Difficulty Somewhat difficult Not difficult at all Not difficult at all    Has moh's surgery on leg planned soon also   Patient Active Problem List   Diagnosis Date Noted   Skin lesion of right leg 10/18/2023   Hyponatremia 10/18/2023   Blood loss anemia 09/13/2023   Closed right hip fracture (HCC) 09/05/2023   Abnormal urinalysis 09/02/2023   Acute cystitis 08/21/2023   Current use of proton pump inhibitor 03/13/2023   Pain in joint, lower leg 01/30/2023   Senile purpura (HCC) 12/26/2022   Skin tear of right lower leg without complication 12/26/2022   Dysuria 10/21/2021   Elevated alkaline phosphatase level 03/11/2021   Palpitations 11/10/2020   Migraine    Hypertension, essential    H/O: CVA (cerebrovascular accident) 11/08/2020   Headache 09/26/2019   Fall at home 06/02/2019   Colon cancer screening 02/05/2018   Degenerative joint disease (DJD) of lumbar spine 05/02/2017   Internal hemorrhoids 05/02/2017   Generalized osteoarthritis of hand 12/11/2016   GAD (generalized anxiety disorder) 12/11/2016   Estrogen deficiency  05/11/2016   Encounter for screening mammogram for breast cancer 05/11/2016   OSA (obstructive sleep apnea) 03/06/2016   Fatigue 01/25/2016   Left knee pain 10/18/2015   Snoring 10/18/2015   Routine general medical examination at a health care facility 02/28/2015   Encounter for Medicare annual wellness exam 02/06/2013   Hyperlipidemia 08/27/2012   Irregular heart beat 08/05/2012   Encounter for gynecological examination 12/20/2010   Osteopenia 12/17/2009   BACK PAIN, LUMBAR 07/02/2009   IRRITABLE BOWEL SYNDROME 04/02/2009   Asymptomatic postmenopausal status 12/10/2008   Hypothyroidism 12/05/2007   Meniere's disease 12/05/2007   GERD 12/05/2007   Past Medical History:  Diagnosis Date   Anxiety    Blood loss anemia 09/13/2023   Cataract 1997   Colon cancer screening 02/05/2018   DDD (degenerative disc disease)    chronic back pain   ETD (eustachian tube dysfunction)    GERD (gastroesophageal reflux disease)    Hypertension 2022   IBS (irritable bowel syndrome)    Meniere's disease    Migraine    still gets visual aura from time to time   Mild cognitive impairment    Osteoporosis    Sleep apnea    CPAP   Stroke (HCC) 2022   Syncopal episodes    after work-up - possible seizures?   Thyroid  disease    hypothyroid   TIA (transient ischemic attack) 11/09/2020   Past Surgical History:  Procedure Laterality Date   APPENDECTOMY     CATARACT EXTRACTION W/PHACO Right 05/21/2019   Procedure: CATARACT EXTRACTION PHACO AND INTRAOCULAR LENS PLACEMENT (IOC) RIGHT panoptix lens  00:35.0  21.7%  7.61;  Surgeon: Annell Kidney, MD;  Location: [facility];  Service: Ophthalmology;  Laterality: Right;  sleep apnea requests early   CATARACT EXTRACTION W/PHACO Left 06/11/2019   Procedure: CATARACT EXTRACTION PHACO AND INTRAOCULAR LENS PLACEMENT (IOC) LEFT PANOPTIX TORIC LENS  00:50.0  20.3%  10.21;  Surgeon: Annell Kidney, MD;  [facility];   Service: Ophthalmology;  Laterality: Left;  sleep apnea requests early   COLONOSCOPY     last 2012 - [facility]   ESOPHAGOGASTRODUODENOSCOPY  Multiple, last 01/07/2017 with Savary dilation to 18 mm   EYE SURGERY  August 2022   Cataracs removed   HEMORRHOID BANDING  2018   [facility]   INTRAMEDULLARY (IM) NAIL INTERTROCHANTERIC Right 09/06/2023   Procedure: INTRAMEDULLARY (IM) NAIL INTERTROCHANTERIC;  Surgeon: Hardy Lia, MD;  Location: [facility];  Service: Orthopedics;  Laterality: Right;   LUMBAR DISC SURGERY  1984   L5    Social History   Tobacco Use   Smoking status: Never    Passive exposure: Never   Smokeless tobacco: Never  Vaping Use   Vaping status: Never Used  Substance Use Topics   Alcohol use: Yes    Comment: Average 1 glass of wine/week   Drug use: Yes    Types: Marijuana    Comment: CBD drops   Family History  Problem Relation Age of Onset   Diabetes Paternal Aunt    Breast cancer Paternal Aunt    Hypertension Maternal Grandmother    Hypertension Maternal Grandfather    Hypertension Sister    Allergies  Allergen Reactions   Aspirin  Other (See Comments)    GI intolerance    Nsaids     GI discomfort and gastritis   Tolmetin Other (See Comments)    GI discomfort and gastritis   Current Outpatient Medications on File Prior to Visit  Medication Sig Dispense Refill   acetaminophen  (TYLENOL ) 500 MG tablet Take 500 mg by mouth every 6 (six) hours as needed for moderate pain (pain score 4-6).     B Complex-C (SUPER B COMPLEX PO) Take 1 tablet by mouth daily.     butalbital -acetaminophen -caffeine  (FIORICET) 50-325-40 MG tablet Take 1 tablet by mouth every 6 (six) hours as needed. 60 tablet 0   cholecalciferol  (VITAMIN D ) 25 MCG (1000 UNIT) tablet Take 1,000 Units by mouth daily.     diclofenac  sodium (VOLTAREN ) 1 % GEL APPLY 2 GRAMS TOPICALLY FOUR TIMES A DAY TO AFFECTED AREAS (Patient taking differently: Apply 2 g topically 2 (two) times daily.) 300 g 3   estradiol   (ESTRACE ) 0.1 MG/GM vaginal cream INSERT SMALL AMOUNT (1 CM) VAGINALLY EVERY OTHER DAY 42.5 g 1   famotidine  (PEPCID ) 40 MG tablet Take 40 mg by mouth daily as needed for heartburn or indigestion.     levothyroxine  (SYNTHROID ) 75 MCG tablet Take 1 tablet (75 mcg total) by mouth daily before breakfast. 90 tablet 0   linaclotide  (LINZESS ) 145 MCG CAPS capsule Take 1 capsule (145 mcg total) by mouth as needed. 90 capsule 3   LORazepam  (ATIVAN ) 0.5 MG tablet Take 1 tablet (0.5 mg total) by mouth 2 (two) times daily as needed for anxiety. 30 tablet 0   metoprolol  tartrate (LOPRESSOR ) 25 MG tablet Take 0.5 tablets (12.5 mg total) by mouth 2 (two) times daily. 90 tablet 1   mometasone (NASONEX) 50 MCG/ACT nasal spray Place 2 sprays into the nose daily as needed (Rhinitis).     omeprazole  (PRILOSEC) 40 MG capsule Take 1 capsule (40 mg total) by mouth daily. 90 capsule 0   sodium chloride  1 g tablet Take 1 tablet (1 g total) by mouth 2 (two) times daily with a meal. 60 tablet 0   sucralfate  (CARAFATE ) 1 g tablet Take 1 g by mouth 4 (four) times daily as needed (Esophagitis).     No current facility-administered medications on file prior to visit.    Review of Systems  Constitutional:  Positive for fatigue. Negative for activity change, appetite change, fever and unexpected weight change.  HENT:  Negative for congestion, ear pain, rhinorrhea, sinus pressure and sore throat.   Eyes:  Negative for pain, redness and visual disturbance.  Respiratory:  Negative for cough, shortness of breath and wheezing.   Cardiovascular:  Negative for chest pain and palpitations.  Gastrointestinal:  Negative for abdominal pain, blood in stool, constipation and diarrhea.  Endocrine: Negative for polydipsia and polyuria.  Genitourinary:  Negative for dysuria, frequency and urgency.  Musculoskeletal:  Positive for arthralgias, back pain and gait problem. Negative for myalgias.  Skin:  Negative for pallor and rash.   Allergic/Immunologic: Negative for environmental allergies.  Neurological:  Negative for dizziness, syncope and headaches.  Hematological:  Negative for adenopathy. Does not bruise/bleed easily.  Psychiatric/Behavioral:  Positive for decreased concentration, dysphoric mood and sleep disturbance. Negative for self-injury and suicidal ideas. The patient is nervous/anxious.        Objective:   Physical Exam Constitutional:      General: She is not in acute distress.    Appearance: Normal appearance. She is well-developed and normal weight. She is not ill-appearing or diaphoretic.  HENT:     Head: Normocephalic and atraumatic.  Eyes:     Conjunctiva/sclera: Conjunctivae normal.     Pupils: Pupils are equal, round, and reactive to light.  Neck:     Thyroid : No thyromegaly.     Vascular: No carotid bruit or JVD.  Cardiovascular:     Rate and Rhythm: Normal rate and regular rhythm.     Heart sounds: Normal heart sounds.     No gallop.  Pulmonary:     Effort: Pulmonary effort is normal. No respiratory distress.     Breath sounds: Normal breath sounds. No wheezing or rales.  Abdominal:     General: There is no distension or abdominal bruit.     Palpations: Abdomen is soft.  Musculoskeletal:     Cervical back: Normal range of motion and neck supple.     Right lower leg: No edema.     Left lower leg: No edema.     Comments: Limited hip and LS rom due to pain  Uses a cane   Lymphadenopathy:     Cervical: No cervical adenopathy.  Skin:    General: Skin is warm and dry.     Coloration: Skin is not pale.     Findings: No rash.  Neurological:     Mental Status: She is alert.     Coordination: Coordination normal.     Deep Tendon Reflexes: Reflexes are normal and symmetric. Reflexes normal.  Psychiatric:        Attention and Perception: Attention normal.        Mood and Affect: Mood is anxious and depressed. Affect is not flat or tearful.        Behavior: Behavior normal.         Cognition and Memory: Cognition normal.     Comments: Candidly discusses symptoms and stressors             Assessment & Plan:   Problem List Items Addressed This Visit       Cardiovascular and Mediastinum   Hypertension, essential   Blood pressure is up  BP: (!) 160/80   Pt suspects worse due to chronic hip pain, anxiety and lack of sleep  Would like to treat mood before advancing HTN medication  Continues  Amlodipine  2.5 mg daily  Metoprolol  12.5 mg bid       Relevant Medications   amLODipine  (NORVASC ) 5  MG tablet     Other   GAD (generalized anxiety disorder) - Primary   Worsened lately in setting of worse hip pain and inability to sleep  Also some depressive features  Discussed treatment options for both depression and pain  Interested in try (re try) of cymbalta 30 mg daily  Discussed expectations of SNRI medication including time to effectiveness and mechanism of action, also poss of side effects (early and late)- including mental fuzziness, weight or appetite change, nausea and poss of worse dep or anxiety (even suicidal thoughts)  Pt voiced understanding and will stop med and update if this occurs   If not tolerated-could consider tricyclic medication with great caution (falls)  She is not ready for counseling referral  Encouraged good self care Follow up 2-4 weeks   Pt wants to hold off on additional blood pressure medicine until anxiety improves       Relevant Medications   DULoxetine (CYMBALTA) 30 MG capsule

## 2024-01-21 NOTE — Assessment & Plan Note (Addendum)
 Worsened lately in setting of worse hip pain and inability to sleep  Also some depressive features  Discussed treatment options for both depression and pain  Interested in try (re try) of cymbalta 30 mg daily  Discussed expectations of SNRI medication including time to effectiveness and mechanism of action, also poss of side effects (early and late)- including mental fuzziness, weight or appetite change, nausea and poss of worse dep or anxiety (even suicidal thoughts)  Pt voiced understanding and will stop med and update if this occurs   If not tolerated-could consider tricyclic medication with great caution (falls)  She is not ready for counseling referral  Encouraged good self care Follow up 2-4 weeks   Pt wants to hold off on additional blood pressure medicine until anxiety improves

## 2024-01-21 NOTE — Patient Instructions (Addendum)
 Start the generic cymbalta   If any intolerable side effects or if you feel worse instead of better let us  know and hold the medication   Let us  know if you want to talk to a mental health counselor   Talk to your specialists about pain control and strategy for the hip    Take care of yourself   Follow up in about 2-4 weeks here for mood and blood pressure

## 2024-01-22 ENCOUNTER — Ambulatory Visit

## 2024-01-22 ENCOUNTER — Telehealth: Payer: Self-pay

## 2024-01-22 NOTE — Telephone Encounter (Signed)
 Patient Name: Sarah Harper MRN: [id number] DOB:1941-03-01, 83 y.o., female Today's Date: 01/22/2024  Phone call to patient to discuss her situation. She reports still having increased overall pain and has a call in to her Ortho MD to discuss. She requested to hold PT until she has a plan with her MD. Agreed to cancel all PT visits week of 01/21/2024 and will await call back from patient to discuss any changes to POC.    Murlene Army, PT 01/22/2024, 10:41 AM

## 2024-01-24 ENCOUNTER — Encounter: Payer: Self-pay | Admitting: Family Medicine

## 2024-01-24 ENCOUNTER — Ambulatory Visit

## 2024-01-28 ENCOUNTER — Emergency Department (HOSPITAL_COMMUNITY)

## 2024-01-28 ENCOUNTER — Inpatient Hospital Stay (HOSPITAL_COMMUNITY)
Admission: EM | Admit: 2024-01-28 | Discharge: 2024-02-05 | DRG: 643 | Disposition: A | Discharge status: Skilled Nursing Facility | Attending: Internal Medicine | Admitting: Internal Medicine

## 2024-01-28 DIAGNOSIS — I10 Essential (primary) hypertension: Secondary | ICD-10-CM | POA: Diagnosis present

## 2024-01-28 DIAGNOSIS — E876 Hypokalemia: Secondary | ICD-10-CM | POA: Diagnosis not present

## 2024-01-28 DIAGNOSIS — Z79899 Other long term (current) drug therapy: Secondary | ICD-10-CM

## 2024-01-28 DIAGNOSIS — T43215A Adverse effect of selective serotonin and norepinephrine reuptake inhibitors, initial encounter: Secondary | ICD-10-CM | POA: Diagnosis not present

## 2024-01-28 DIAGNOSIS — E870 Hyperosmolality and hypernatremia: Secondary | ICD-10-CM | POA: Diagnosis present

## 2024-01-28 DIAGNOSIS — E871 Hypo-osmolality and hyponatremia: Secondary | ICD-10-CM | POA: Diagnosis not present

## 2024-01-28 DIAGNOSIS — F411 Generalized anxiety disorder: Secondary | ICD-10-CM | POA: Diagnosis present

## 2024-01-28 DIAGNOSIS — G9341 Metabolic encephalopathy: Secondary | ICD-10-CM | POA: Diagnosis present

## 2024-01-28 DIAGNOSIS — E559 Vitamin D deficiency, unspecified: Secondary | ICD-10-CM | POA: Insufficient documentation

## 2024-01-28 DIAGNOSIS — N3000 Acute cystitis without hematuria: Secondary | ICD-10-CM | POA: Diagnosis present

## 2024-01-28 DIAGNOSIS — G928 Other toxic encephalopathy: Secondary | ICD-10-CM | POA: Diagnosis present

## 2024-01-28 DIAGNOSIS — K219 Gastro-esophageal reflux disease without esophagitis: Secondary | ICD-10-CM | POA: Diagnosis present

## 2024-01-28 DIAGNOSIS — G4730 Sleep apnea, unspecified: Secondary | ICD-10-CM | POA: Diagnosis present

## 2024-01-28 DIAGNOSIS — Z8249 Family history of ischemic heart disease and other diseases of the circulatory system: Secondary | ICD-10-CM

## 2024-01-28 DIAGNOSIS — B952 Enterococcus as the cause of diseases classified elsewhere: Secondary | ICD-10-CM | POA: Diagnosis present

## 2024-01-28 DIAGNOSIS — E222 Syndrome of inappropriate secretion of antidiuretic hormone: Secondary | ICD-10-CM | POA: Diagnosis not present

## 2024-01-28 DIAGNOSIS — Z8673 Personal history of transient ischemic attack (TIA), and cerebral infarction without residual deficits: Secondary | ICD-10-CM

## 2024-01-28 DIAGNOSIS — E039 Hypothyroidism, unspecified: Secondary | ICD-10-CM | POA: Diagnosis present

## 2024-01-28 DIAGNOSIS — N390 Urinary tract infection, site not specified: Secondary | ICD-10-CM

## 2024-01-28 DIAGNOSIS — Z79890 Hormone replacement therapy (postmenopausal): Secondary | ICD-10-CM

## 2024-01-28 LAB — CBC
HCT: 36.2 % (ref 36.0–46.0)
Hemoglobin: 12.3 g/dL (ref 12.0–15.0)
MCH: 29.3 pg (ref 26.0–34.0)
MCHC: 34 g/dL (ref 30.0–36.0)
MCV: 86.2 fL (ref 80.0–100.0)
Platelets: 229 10*3/uL (ref 150–400)
RBC: 4.2 MIL/uL (ref 3.87–5.11)
RDW: 12.8 % (ref 11.5–15.5)
WBC: 7.6 10*3/uL (ref 4.0–10.5)
nRBC: 0 % (ref 0.0–0.2)

## 2024-01-28 LAB — URINALYSIS, ROUTINE W REFLEX MICROSCOPIC
Bilirubin Urine: NEGATIVE
Glucose, UA: 50 mg/dL — AB
Hgb urine dipstick: NEGATIVE
Ketones, ur: 5 mg/dL — AB
Nitrite: NEGATIVE
Protein, ur: NEGATIVE mg/dL
Specific Gravity, Urine: 1.008 (ref 1.005–1.030)
pH: 6 (ref 5.0–8.0)

## 2024-01-28 LAB — COMPREHENSIVE METABOLIC PANEL WITH GFR
ALT: 16 U/L (ref 0–44)
AST: 30 U/L (ref 15–41)
Albumin: 3.7 g/dL (ref 3.5–5.0)
Alkaline Phosphatase: 76 U/L (ref 38–126)
Anion gap: 10 (ref 5–15)
BUN: 15 mg/dL (ref 8–23)
CO2: 22 mmol/L (ref 22–32)
Calcium: 8.4 mg/dL — ABNORMAL LOW (ref 8.9–10.3)
Chloride: 89 mmol/L — ABNORMAL LOW (ref 98–111)
Creatinine, Ser: 0.72 mg/dL (ref 0.44–1.00)
GFR, Estimated: >60 mL/min (ref >60)
Glucose, Bld: 178 mg/dL — ABNORMAL HIGH (ref 70–99)
Potassium: 3.5 mmol/L (ref 3.5–5.1)
Sodium: 121 mmol/L — ABNORMAL LOW (ref 135–145)
Total Bilirubin: 1.5 mg/dL — ABNORMAL HIGH (ref 0.0–1.2)
Total Protein: 6.4 g/dL — ABNORMAL LOW (ref 6.5–8.1)

## 2024-01-28 LAB — TROPONIN I (HIGH SENSITIVITY): Troponin I (High Sensitivity): 12 ng/L (ref <18)

## 2024-01-28 LAB — OSMOLALITY: Osmolality: 258 mosm/kg — ABNORMAL LOW (ref 275–295)

## 2024-01-28 LAB — AMMONIA: Ammonia: <13 umol/L (ref 9–35)

## 2024-01-28 LAB — CBG MONITORING, ED: Glucose-Capillary: 102 mg/dL — ABNORMAL HIGH (ref 70–99)

## 2024-01-28 LAB — SODIUM, URINE, RANDOM: Sodium, Ur: 81 mmol/L

## 2024-01-28 MED ORDER — LACTATED RINGERS IV BOLUS
500.0000 mL | Freq: Once | INTRAVENOUS | Status: AC
  Administered 2024-01-28: 500 mL via INTRAVENOUS

## 2024-01-28 MED ORDER — SODIUM CHLORIDE 0.9 % IV SOLN
1.0000 g | Freq: Once | INTRAVENOUS | Status: AC
  Administered 2024-01-28: 1 g via INTRAVENOUS
  Filled 2024-01-28: qty 10

## 2024-01-28 NOTE — ED Triage Notes (Signed)
 Pt BIB [facility] for syncopal episode at kitchen table. Per EMS family endorses that pt hasn't felt well for a few days and has been altered. Also states that pt had a fall onto bottom a couple of days.  No LOC with the fall.  No thinners.  Pt is oriented to self at this time.   Fire reported initial BP of 60/40.  EMS gave 500 mL NS through 20 G in L. AC  Last set of VS 170/62 HR 60, RR 16, 98% RA, [id number] CBG 143.

## 2024-01-28 NOTE — ED Notes (Signed)
 Pt stated "still feels have the urgency to urinate, pt request to stay on bedpan and will let staff know when pt is done"

## 2024-01-28 NOTE — ED Provider Notes (Signed)
  Physical Exam  BP (!) 150/68   Pulse 80   Temp 98.7 F (37.1 C) (Oral)   Resp 18   SpO2 (!) 83%   Physical Exam  Procedures  Procedures  ED Course / MDM    Medical Decision Making Amount and/or Complexity of Data Reviewed Labs: ordered. Radiology: ordered.  Risk Decision regarding hospitalization.   Assuming care of patient from Dr. Aldean Amass   Patient in the ED for ams, near syncope. Workup thus far shows Na of 121.   Concerning findings are as following Na of 121. Important pending results are - admission call  According to Dr. Aldean Amass, plan is to admit for symptomatic hyponatremia.  No need for hypertonic saline emergently.    Patient had no complains, no concerns from the nursing side. Will continue to monitor.        Deatra Face, MD 01/28/24 [phone]

## 2024-01-28 NOTE — ED Provider Notes (Signed)
 [place] EMERGENCY DEPARTMENT AT [facility] Provider Note   CSN: [id number] Arrival date & time: 01/28/24  1204     History  Chief Complaint  Patient presents with   Loss of Consciousness    Sarah Harper is a 83 y.o. female.  HPI 83 year old female presents with confusion and syncope/near syncope.  History is primarily from the husband over the phone.  Patient has been confused for the last 1 week which corresponds with her taking a new blood pressure pill.  No other new medications.  She has been acting differently, especially asking strange questions and going to bed earlier and staying in bed more often.  While at breakfast she was sitting and her head drooped and she either passed out or nearly passed out.  Husband estimates she was that way for about an hour and she would mumble responses to them but would not respond to his commands to go to the bedroom to rest.  Thus he called 911.  First responders reportedly noted her to be hypotensive into the 60s and she was given some fluids.  She did fall onto her buttocks a couple nights ago but with no apparent injury.  Today before this episode she was staggering and nearly fell but did not actually fall.  Patient denies any acute complaints when asked but seems generally confused.  Home Medications Prior to Admission medications   Medication Sig Start Date End Date Taking? Authorizing Provider  acetaminophen  (TYLENOL ) 500 MG tablet Take 500 mg by mouth every 6 (six) hours as needed for moderate pain (pain score 4-6).    [provider]  amLODipine  (NORVASC ) 5 MG tablet Take 1/2 tablet (2.5 mg) by mouth daily 01/21/24   Tower, Manley Seeds, MD  B Complex-C (SUPER B COMPLEX PO) Take 1 tablet by mouth daily.    [provider]  butalbital -acetaminophen -caffeine  (FIORICET) 50-325-40 MG tablet Take 1 tablet by mouth every 6 (six) hours as needed. 03/20/23   Tower, Manley Seeds, MD  cholecalciferol  (VITAMIN D ) 25 MCG (1000  UNIT) tablet Take 1,000 Units by mouth daily.    [provider]  diclofenac  sodium (VOLTAREN ) 1 % GEL APPLY 2 GRAMS TOPICALLY FOUR TIMES A DAY TO AFFECTED AREAS Patient taking differently: Apply 2 g topically 2 (two) times daily. 05/24/18   Tower, Manley Seeds, MD  DULoxetine  (CYMBALTA ) 30 MG capsule Take 1 capsule (30 mg total) by mouth daily. 01/21/24   Tower, Manley Seeds, MD  estradiol  (ESTRACE ) 0.1 MG/GM vaginal cream INSERT SMALL AMOUNT (1 CM) VAGINALLY EVERY OTHER DAY 07/04/22   Tower, Manley Seeds, MD  famotidine  (PEPCID ) 40 MG tablet Take 40 mg by mouth daily as needed for heartburn or indigestion. 08/20/23   [provider]  levothyroxine  (SYNTHROID ) 75 MCG tablet Take 1 tablet (75 mcg total) by mouth daily before breakfast. 11/09/23   Tower, Manley Seeds, MD  linaclotide  (LINZESS ) 145 MCG CAPS capsule Take 1 capsule (145 mcg total) by mouth as needed. 03/13/22   Tower, Manley Seeds, MD  LORazepam  (ATIVAN ) 0.5 MG tablet Take 1 tablet (0.5 mg total) by mouth 2 (two) times daily as needed for anxiety. 09/17/23   Daren Eck, DO  metoprolol  tartrate (LOPRESSOR ) 25 MG tablet Take 0.5 tablets (12.5 mg total) by mouth 2 (two) times daily. 12/24/23   Tower, Manley Seeds, MD  mometasone (NASONEX) 50 MCG/ACT nasal spray Place 2 sprays into the nose daily as needed (Rhinitis).    [provider]  omeprazole  Charlett Conroy)  40 MG capsule Take 1 capsule (40 mg total) by mouth daily. 11/09/23   Tower, Manley Seeds, MD  sodium chloride  1 g tablet Take 1 tablet (1 g total) by mouth 2 (two) times daily with a meal. 09/17/23   Daren Eck, DO  sucralfate  (CARAFATE ) 1 g tablet Take 1 g by mouth 4 (four) times daily as needed (Esophagitis).    [provider]      Allergies    Aspirin , Nsaids, and Tolmetin    Review of Systems   Review of Systems  Unable to perform ROS: Mental status change    Physical Exam Updated Vital Signs BP (!) 150/68   Pulse 80   Temp 98.7 F (37.1 C) (Oral)   Resp 18   SpO2  (!) 83%  Physical Exam Vitals and nursing note reviewed.  Constitutional:      Appearance: She is well-developed.  HENT:     Head: Normocephalic and atraumatic.  Eyes:     Extraocular Movements: Extraocular movements intact.     Pupils: Pupils are equal, round, and reactive to light.  Cardiovascular:     Rate and Rhythm: Normal rate and regular rhythm.     Heart sounds: Normal heart sounds.  Pulmonary:     Effort: Pulmonary effort is normal.     Breath sounds: Normal breath sounds.  Abdominal:     General: There is no distension.     Palpations: Abdomen is soft.     Tenderness: There is no abdominal tenderness.  Skin:    General: Skin is warm and dry.  Neurological:     Mental Status: She is alert.     Comments: Oriented to person, place, year, but disoriented to situation. Awake, alert, no facial droop. 5/5 strength in all 4 extremities.      ED Results / Procedures / Treatments   Labs (all labs ordered are listed, but only abnormal results are displayed) Labs Reviewed  COMPREHENSIVE METABOLIC PANEL WITH GFR - Abnormal; Notable for the following components:      Result Value   Sodium 121 (*)    Chloride 89 (*)    Glucose, Bld 178 (*)    Calcium  8.4 (*)    Total Protein 6.4 (*)    Total Bilirubin 1.5 (*)    All other components within normal limits  URINALYSIS, ROUTINE W REFLEX MICROSCOPIC - Abnormal; Notable for the following components:   Glucose, UA 50 (*)    Ketones, ur 5 (*)    Leukocytes,Ua MODERATE (*)    Bacteria, UA RARE (*)    All other components within normal limits  OSMOLALITY - Abnormal; Notable for the following components:   Osmolality 258 (*)    All other components within normal limits  URINE CULTURE  CBC  AMMONIA  SODIUM, URINE, RANDOM  CBG MONITORING, ED  TROPONIN I (HIGH SENSITIVITY)  TROPONIN I (HIGH SENSITIVITY)    EKG EKG Interpretation Date/Time:  Monday January 28 2024 12:14:32 EDT Ventricular Rate:  85 PR Interval:  248 QRS  Duration:  106 QT Interval:  396 QTC Calculation: 471 R Axis:   -40  Text Interpretation: Sinus rhythm Atrial premature complex Prolonged PR interval Probable left atrial enlargement Incomplete RBBB and LAFB RSR' in V1 or V2, right VCD or RVH Nonspecific T abnormalities, lateral leads overall smilar to 2022 Confirmed by Jerilynn Montenegro [phone]) on 01/28/2024 12:17:58 PM  Radiology DG Chest Portable 1 View Result Date: 01/28/2024 CLINICAL DATA:  Syncope. EXAM: PORTABLE CHEST 1  VIEW COMPARISON:  September 05, 2023 FINDINGS: The heart size and mediastinal contours are within normal limits. Low lung volumes are noted. There is no evidence of acute infiltrate, pleural effusion or pneumothorax. Multilevel degenerative changes seen throughout the thoracic spine. IMPRESSION: Low lung volumes without evidence of acute or active cardiopulmonary disease. Electronically Signed   By: Virgle Grime M.D.   On: 01/28/2024 14:08    Procedures Procedures    Medications Ordered in ED Medications  cefTRIAXone (ROCEPHIN) 1 g in sodium chloride  0.9 % 100 mL IVPB (has no administration in time range)  lactated ringers  bolus 500 mL (500 mLs Intravenous New Bag/Given 01/28/24 1402)    ED Course/ Medical Decision Making/ A&P                                 Medical Decision Making Amount and/or Complexity of Data Reviewed Independent Historian: spouse Labs: ordered.    Details: Hyponatremia Radiology: ordered and independent interpretation performed.    Details: No Pneumonia ECG/medicine tests: ordered and independent interpretation performed.    Details: No acute change  Risk Decision regarding hospitalization.   I suspect her confusion the husband has been describing is from hyponatremia. However, she is awake, alert, and only mildly confused here. I don't think she needs 3% saline. CT head and other labs are reassuring, save for a UTI. Will give rocephin.  Hospitalist paged for admission but has been  pending so care transferred to Dr. Lewis Red.        Final Clinical Impression(s) / ED Diagnoses Final diagnoses:  Hyponatremia  Acute urinary tract infection    Rx / DC Orders ED Discharge Orders     None         Jerilynn Montenegro, MD 01/28/24 1622

## 2024-01-29 ENCOUNTER — Observation Stay (HOSPITAL_COMMUNITY)

## 2024-01-29 ENCOUNTER — Encounter (HOSPITAL_COMMUNITY): Payer: Self-pay | Admitting: Internal Medicine

## 2024-01-29 ENCOUNTER — Ambulatory Visit

## 2024-01-29 DIAGNOSIS — N3000 Acute cystitis without hematuria: Secondary | ICD-10-CM

## 2024-01-29 DIAGNOSIS — E559 Vitamin D deficiency, unspecified: Secondary | ICD-10-CM | POA: Insufficient documentation

## 2024-01-29 DIAGNOSIS — F411 Generalized anxiety disorder: Secondary | ICD-10-CM

## 2024-01-29 DIAGNOSIS — G4730 Sleep apnea, unspecified: Secondary | ICD-10-CM | POA: Diagnosis present

## 2024-01-29 DIAGNOSIS — Z8249 Family history of ischemic heart disease and other diseases of the circulatory system: Secondary | ICD-10-CM | POA: Diagnosis not present

## 2024-01-29 DIAGNOSIS — I10 Essential (primary) hypertension: Secondary | ICD-10-CM | POA: Diagnosis present

## 2024-01-29 DIAGNOSIS — G9341 Metabolic encephalopathy: Secondary | ICD-10-CM | POA: Diagnosis present

## 2024-01-29 DIAGNOSIS — E870 Hyperosmolality and hypernatremia: Secondary | ICD-10-CM

## 2024-01-29 DIAGNOSIS — Z8673 Personal history of transient ischemic attack (TIA), and cerebral infarction without residual deficits: Secondary | ICD-10-CM | POA: Diagnosis not present

## 2024-01-29 DIAGNOSIS — E871 Hypo-osmolality and hyponatremia: Secondary | ICD-10-CM

## 2024-01-29 DIAGNOSIS — R55 Syncope and collapse: Secondary | ICD-10-CM

## 2024-01-29 DIAGNOSIS — T43215A Adverse effect of selective serotonin and norepinephrine reuptake inhibitors, initial encounter: Secondary | ICD-10-CM | POA: Diagnosis not present

## 2024-01-29 DIAGNOSIS — G928 Other toxic encephalopathy: Secondary | ICD-10-CM | POA: Diagnosis present

## 2024-01-29 DIAGNOSIS — E038 Other specified hypothyroidism: Secondary | ICD-10-CM

## 2024-01-29 DIAGNOSIS — E876 Hypokalemia: Secondary | ICD-10-CM | POA: Diagnosis not present

## 2024-01-29 DIAGNOSIS — E222 Syndrome of inappropriate secretion of antidiuretic hormone: Secondary | ICD-10-CM | POA: Diagnosis present

## 2024-01-29 DIAGNOSIS — K219 Gastro-esophageal reflux disease without esophagitis: Secondary | ICD-10-CM | POA: Diagnosis present

## 2024-01-29 DIAGNOSIS — E039 Hypothyroidism, unspecified: Secondary | ICD-10-CM | POA: Diagnosis present

## 2024-01-29 DIAGNOSIS — Z79890 Hormone replacement therapy (postmenopausal): Secondary | ICD-10-CM | POA: Diagnosis not present

## 2024-01-29 DIAGNOSIS — B952 Enterococcus as the cause of diseases classified elsewhere: Secondary | ICD-10-CM | POA: Diagnosis present

## 2024-01-29 DIAGNOSIS — Z79899 Other long term (current) drug therapy: Secondary | ICD-10-CM | POA: Diagnosis not present

## 2024-01-29 DIAGNOSIS — R569 Unspecified convulsions: Secondary | ICD-10-CM | POA: Diagnosis not present

## 2024-01-29 DIAGNOSIS — R4182 Altered mental status, unspecified: Secondary | ICD-10-CM | POA: Diagnosis not present

## 2024-01-29 LAB — ECHOCARDIOGRAM COMPLETE
AR max vel: 2.69 cm2
AV Area VTI: 2.95 cm2
AV Area mean vel: 2.49 cm2
AV Mean grad: 4 mmHg
AV Peak grad: 7.4 mmHg
Ao pk vel: 1.36 m/s
Area-P 1/2: 2.6 cm2
Calc EF: 73.9 %
S' Lateral: 2.6 cm
Single Plane A2C EF: 72.5 %
Single Plane A4C EF: 75.3 %

## 2024-01-29 LAB — BASIC METABOLIC PANEL WITH GFR
Anion gap: 12 (ref 5–15)
Anion gap: 10 (ref 5–15)
Anion gap: 13 (ref 5–15)
Anion gap: 10 (ref 5–15)
BUN: 6 mg/dL — ABNORMAL LOW (ref 8–23)
BUN: 7 mg/dL — ABNORMAL LOW (ref 8–23)
BUN: 8 mg/dL (ref 8–23)
BUN: 7 mg/dL — ABNORMAL LOW (ref 8–23)
CO2: 24 mmol/L (ref 22–32)
CO2: 29 mmol/L (ref 22–32)
CO2: 23 mmol/L (ref 22–32)
CO2: 26 mmol/L (ref 22–32)
Calcium: 8.8 mg/dL — ABNORMAL LOW (ref 8.9–10.3)
Calcium: 9.5 mg/dL (ref 8.9–10.3)
Calcium: 9 mg/dL (ref 8.9–10.3)
Calcium: 9 mg/dL (ref 8.9–10.3)
Chloride: 88 mmol/L — ABNORMAL LOW (ref 98–111)
Chloride: 88 mmol/L — ABNORMAL LOW (ref 98–111)
Chloride: 90 mmol/L — ABNORMAL LOW (ref 98–111)
Chloride: 89 mmol/L — ABNORMAL LOW (ref 98–111)
Creatinine, Ser: 0.55 mg/dL (ref 0.44–1.00)
Creatinine, Ser: 0.71 mg/dL (ref 0.44–1.00)
Creatinine, Ser: 0.53 mg/dL (ref 0.44–1.00)
Creatinine, Ser: 0.54 mg/dL (ref 0.44–1.00)
GFR, Estimated: >60 mL/min (ref >60)
GFR, Estimated: >60 mL/min (ref >60)
GFR, Estimated: >60 mL/min (ref >60)
GFR, Estimated: >60 mL/min (ref >60)
Glucose, Bld: 130 mg/dL — ABNORMAL HIGH (ref 70–99)
Glucose, Bld: 112 mg/dL — ABNORMAL HIGH (ref 70–99)
Glucose, Bld: 88 mg/dL (ref 70–99)
Glucose, Bld: 114 mg/dL — ABNORMAL HIGH (ref 70–99)
Potassium: 2.5 mmol/L — CL (ref 3.5–5.1)
Potassium: 3 mmol/L — ABNORMAL LOW (ref 3.5–5.1)
Potassium: 3.7 mmol/L (ref 3.5–5.1)
Potassium: 2.7 mmol/L — CL (ref 3.5–5.1)
Sodium: 124 mmol/L — ABNORMAL LOW (ref 135–145)
Sodium: 127 mmol/L — ABNORMAL LOW (ref 135–145)
Sodium: 126 mmol/L — ABNORMAL LOW (ref 135–145)
Sodium: 125 mmol/L — ABNORMAL LOW (ref 135–145)

## 2024-01-29 LAB — CBC
HCT: 37.5 % (ref 36.0–46.0)
Hemoglobin: 13 g/dL (ref 12.0–15.0)
MCH: 29 pg (ref 26.0–34.0)
MCHC: 34.7 g/dL (ref 30.0–36.0)
MCV: 83.7 fL (ref 80.0–100.0)
Platelets: 270 10*3/uL (ref 150–400)
RBC: 4.48 MIL/uL (ref 3.87–5.11)
RDW: 13 % (ref 11.5–15.5)
WBC: 10 10*3/uL (ref 4.0–10.5)
nRBC: 0 % (ref 0.0–0.2)

## 2024-01-29 LAB — TSH: TSH: 6.578 u[IU]/mL — ABNORMAL HIGH (ref 0.350–4.500)

## 2024-01-29 LAB — TROPONIN I (HIGH SENSITIVITY): Troponin I (High Sensitivity): 14 ng/L (ref <18)

## 2024-01-29 MED ORDER — DIPHENHYDRAMINE HCL 50 MG/ML IJ SOLN
25.0000 mg | Freq: Once | INTRAMUSCULAR | Status: AC
  Administered 2024-01-29: 25 mg via INTRAVENOUS
  Filled 2024-01-29: qty 1

## 2024-01-29 MED ORDER — METOPROLOL TARTRATE 12.5 MG HALF TABLET
12.5000 mg | ORAL_TABLET | Freq: Two times a day (BID) | ORAL | Status: DC
  Administered 2024-01-29 – 2024-02-05 (×13): 12.5 mg via ORAL
  Filled 2024-01-29 (×16): qty 1

## 2024-01-29 MED ORDER — POTASSIUM CHLORIDE CRYS ER 20 MEQ PO TBCR
40.0000 meq | EXTENDED_RELEASE_TABLET | Freq: Once | ORAL | Status: AC
  Administered 2024-01-30: 40 meq via ORAL
  Filled 2024-01-29: qty 2

## 2024-01-29 MED ORDER — SODIUM CHLORIDE 0.9 % IV SOLN: 250.0000 mL | INTRAVENOUS | Status: AC | PRN

## 2024-01-29 MED ORDER — SODIUM CHLORIDE 0.9% FLUSH: 3.0000 mL | INTRAVENOUS | Status: DC | PRN

## 2024-01-29 MED ORDER — ONDANSETRON HCL 4 MG/2ML IJ SOLN: 4.0000 mg | Freq: Four times a day (QID) | INTRAMUSCULAR | Status: DC | PRN

## 2024-01-29 MED ORDER — SODIUM CHLORIDE 0.9% FLUSH
3.0000 mL | Freq: Two times a day (BID) | INTRAVENOUS | Status: DC
  Administered 2024-01-29 – 2024-02-03 (×10): 3 mL via INTRAVENOUS

## 2024-01-29 MED ORDER — ONDANSETRON HCL 4 MG PO TABS: 4.0000 mg | ORAL_TABLET | Freq: Four times a day (QID) | ORAL | Status: DC | PRN

## 2024-01-29 MED ORDER — HYDRALAZINE HCL 20 MG/ML IJ SOLN
10.0000 mg | Freq: Four times a day (QID) | INTRAMUSCULAR | Status: DC | PRN
  Administered 2024-01-29 – 2024-02-02 (×2): 10 mg via INTRAVENOUS
  Filled 2024-01-29 (×3): qty 1

## 2024-01-29 MED ORDER — HALOPERIDOL LACTATE 5 MG/ML IJ SOLN
1.0000 mg | Freq: Four times a day (QID) | INTRAMUSCULAR | Status: DC | PRN
  Administered 2024-01-29 – 2024-01-30 (×2): 1 mg via INTRAVENOUS
  Filled 2024-01-29 (×2): qty 1

## 2024-01-29 MED ORDER — POTASSIUM CHLORIDE CRYS ER 10 MEQ PO TBCR
40.0000 meq | EXTENDED_RELEASE_TABLET | Freq: Once | ORAL | Status: DC
  Filled 2024-01-29: qty 4

## 2024-01-29 MED ORDER — SUCRALFATE 1 G PO TABS: 1.0000 g | ORAL_TABLET | Freq: Four times a day (QID) | ORAL | Status: DC | PRN

## 2024-01-29 MED ORDER — POTASSIUM CHLORIDE 10 MEQ/100ML IV SOLN
10.0000 meq | INTRAVENOUS | Status: AC
  Administered 2024-01-29 – 2024-01-30 (×4): 10 meq via INTRAVENOUS
  Filled 2024-01-29 (×4): qty 100

## 2024-01-29 MED ORDER — POTASSIUM CHLORIDE 10 MEQ/100ML IV SOLN
10.0000 meq | INTRAVENOUS | Status: AC
  Administered 2024-01-29 (×3): 10 meq via INTRAVENOUS
  Filled 2024-01-29 (×2): qty 100

## 2024-01-29 MED ORDER — PANTOPRAZOLE SODIUM 40 MG PO TBEC: 40.0000 mg | DELAYED_RELEASE_TABLET | Freq: Every day | ORAL | Status: DC

## 2024-01-29 MED ORDER — ACETAMINOPHEN 650 MG RE SUPP: 650.0000 mg | Freq: Four times a day (QID) | RECTAL | Status: DC | PRN

## 2024-01-29 MED ORDER — HALOPERIDOL LACTATE 5 MG/ML IJ SOLN
2.0000 mg | Freq: Once | INTRAMUSCULAR | Status: AC
  Administered 2024-01-29: 2 mg via INTRAVENOUS
  Filled 2024-01-29: qty 1

## 2024-01-29 MED ORDER — LEVOTHYROXINE SODIUM 75 MCG PO TABS
75.0000 ug | ORAL_TABLET | Freq: Every day | ORAL | Status: DC
  Administered 2024-01-29 – 2024-02-05 (×7): 75 ug via ORAL
  Filled 2024-01-29 (×8): qty 1

## 2024-01-29 MED ORDER — SODIUM CHLORIDE 0.9 % IV SOLN
1.0000 g | INTRAVENOUS | Status: DC
  Administered 2024-01-29: 1 g via INTRAVENOUS
  Filled 2024-01-29: qty 10

## 2024-01-29 MED ORDER — SODIUM CHLORIDE 0.9% FLUSH
3.0000 mL | Freq: Two times a day (BID) | INTRAVENOUS | Status: DC
  Administered 2024-01-29 – 2024-02-03 (×10): 3 mL via INTRAVENOUS

## 2024-01-29 MED ORDER — ACETAMINOPHEN 325 MG PO TABS
650.0000 mg | ORAL_TABLET | Freq: Four times a day (QID) | ORAL | Status: DC | PRN
  Administered 2024-01-31: 650 mg via ORAL
  Filled 2024-01-29: qty 2

## 2024-01-29 MED ORDER — ENOXAPARIN SODIUM 40 MG/0.4ML IJ SOSY
40.0000 mg | PREFILLED_SYRINGE | INTRAMUSCULAR | Status: DC
  Administered 2024-01-29 – 2024-02-05 (×7): 40 mg via SUBCUTANEOUS
  Filled 2024-01-29 (×8): qty 0.4

## 2024-01-29 MED ORDER — POTASSIUM CHLORIDE CRYS ER 20 MEQ PO TBCR
40.0000 meq | EXTENDED_RELEASE_TABLET | Freq: Once | ORAL | Status: AC
  Administered 2024-01-29: 40 meq via ORAL
  Filled 2024-01-29: qty 2

## 2024-01-29 MED ORDER — AMLODIPINE BESYLATE 5 MG PO TABS
2.5000 mg | ORAL_TABLET | Freq: Every day | ORAL | Status: DC
  Administered 2024-01-30 – 2024-02-05 (×6): 2.5 mg via ORAL
  Filled 2024-01-29 (×7): qty 1

## 2024-01-29 MED ORDER — LORAZEPAM 2 MG/ML IJ SOLN
0.5000 mg | Freq: Once | INTRAMUSCULAR | Status: AC
  Administered 2024-01-29: 0.5 mg via INTRAVENOUS
  Filled 2024-01-29: qty 1

## 2024-01-29 NOTE — ED Notes (Signed)
 Sleeping, NAD, calm, arousable to voice, minimal interaction. No changes. Answers some questions. Follows commands. Falls back to sleep. Mittens on. 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 KCL runs 2 or 4 infusing.

## 2024-01-29 NOTE — Progress Notes (Addendum)
 Same day note  Sarah Harper is a 83 y.o. female with past medical history significant for essential hypertension, generalized anxiety disorder, hypothyroidism, vitamin B12 and vitamin D  deficiency presented to the emergency department for evaluation for syncopal episode at home. Patient's husband reported that she has been seemed confused over the course of last 1 week corresponds with her taking a new blood pressure pill.  She was also recently started on Cymbalta .  No other new medications.  She has been acting differently, especially asking strange questions and going to bed earlier and staying in bed more often.  EMS was called.  In the ED patient was hypertensive.  Initial labs showed sodium of 121.    Urine sodium 81. Urinalysis with red cells.  CBC was unremarkable.  Troponin within normal range.  EKG showed normal sinus rhythm.  CT head without any acute findings.  Chest x-ray with low lung volumes.  In the ED patient was confused and restless and received Haldol.  Patient was then considered for admission to the hospital for further evaluation and treatment.  Patient seen and examined at bedside.  Patient was admitted to the hospital for confusion and syncope  At the time of my evaluation, patient appears to be very somnolent  Physical examination reveals thinly built female, somnolent, minimally responsive on stimulus.  Laboratory data and imaging was reviewed  Assessment and Plan.  Acute metabolic encephalopathy likely secondary to UTI and hyponatremia. Patient appears to be deeply somnolent.  Recent syncopal episode and confusion.  Was oriented to self in the ED.  Initial sodium was 121.  CT head negative.  Continue to monitor closely.  Delirium precautions.  Treat for UTI.  Check BMP in p.m.  Syncopal episode likely secondary to hyponatremia UTI.  Continue telemetry monitor.  Will check 2D echocardiogram.   Acute cystitis Check urine culture.  Continue Rocephin  IV  Hypokalemia.  Potassium was 2.5 today.  Will replace through IV and orally..  Check levels in AM.  Hypoosmolar hyponatremia history of previous hypoosmolar hyponatremia and SIADH. Euvolemic on exam.  Recently been started on Cymbalta  6/2.  Continue to hold Cymbalta .  TSH of 6.5.  Serum osmolality low.  Likely SIADH.  Continue fluid restriction 1.2 L. If with fluid restriction fails to improve sodium level in that case will give oral sodium chloride  tablet, urea or tolvaptan. Stop Protonix  and Cymbalta .  Sodium level today at 124 from 121 yesterday.  Check BMP in p.m. and again in AM.   Hypothyroidism Continue levothyroxine .  TSH borderline high at 6.5.   Generalized anxiety disorder Was recently started on Cymbalta .  Will hold off for now in the context of hyponatremia.   Essential hypertension Continue amlodipine  metoprolol  and hydralazine .  Continue to monitor blood pressure.   History of hip fracture status post nailing repair in January 2025 Continue physical therapy.  Fall precautions.   GERD Discontinue Protonix  for now in the context of hyponatremia.   I tried to reach out to the patient's spouse from the phone but was unable to reach him. Change to inpatient. Will order PT and OT  No Charge  Signed,  Lindwood Rhody, MD [facility]

## 2024-01-29 NOTE — ED Notes (Signed)
 Received 2.5 bags of 4 KCL runs. KCL stopped. Order d/c'd or timed out. Pending BMP.

## 2024-01-29 NOTE — Progress Notes (Signed)
*  PRELIMINARY RESULTS* Echocardiogram 2D Echocardiogram has been performed.  Sarah Harper 01/29/2024, 2:01 PM

## 2024-01-29 NOTE — Progress Notes (Signed)
 Patient continuously pulling at leads and trying to get out of bed .  MD paged and 2 mg IV Haldol ordered .

## 2024-01-29 NOTE — ED Notes (Addendum)
 Sleeping, semi-arousable to voice, shook head no to pain, unable to hold conversation. Somnolent. Answers some questions, follows some commands, inconsistent. KCL infusing. Unable to do PO safely. SB on monitor. VSS.

## 2024-01-29 NOTE — ED Notes (Addendum)
 Echo finished at [facility] L arm IV questionable, will reassess. New IV started R FA. BMP sent, minimal return. Sent in pediatric green bullet. IVF and KCL run continue to new R FA IV. Husband present. Sitter present.

## 2024-01-29 NOTE — H&P (Addendum)
 History and Physical    Sarah Harper WUJ:[id number] DOB: October 31, 1940 DOA: 01/28/2024  PCP: Clemens Curt, MD   Patient coming from: Home   Chief Complaint:  Chief Complaint  Patient presents with   Loss of Consciousness   ED TRIAGE note:Pt BIB [facility] for syncopal episode at kitchen table. Per EMS family endorses that pt hasn't felt well for a few days and has been altered. Also states that pt had a fall onto bottom a couple of days.  No LOC with the fall.  No thinners.  Pt is oriented to self at this time.    Fire reported initial BP of 60/40.   EMS gave 500 mL NS through 20 G in L. AC   Last set of VS 170/62 HR 60, RR 16, 98% RA, [id number] CBG 143.  HPI:  Sarah Harper is a 83 y.o. female with medical history significant of essential hypertension, generalized anxiety disorder, hypothyroidism, vitamin B12 and vitamin D  deficiency presented to emergency department for evaluation for syncopal episode at home. Patient's husband reported that she has been seemed confused over the course of last 1 week and had not near syncopal episode in the kitchen today. Per patient's husband patient has been confused for the last 1 week which corresponds with her taking a new blood pressure pill.  No other new medications.  She has been acting differently, especially asking strange questions and going to bed earlier and staying in bed more often.  While at breakfast she was sitting and her head drooped and she either passed out or nearly passed out.  Husband estimates she was that way for about an hour and she would mumble responses to them but would not respond to his commands to go to the bedroom to rest.  Thus he called 911.  First responders reportedly noted her to be hypotensive into the 60s and she was given some fluids.  She did fall onto her buttocks a couple nights ago but with no apparent injury.  Today before this episode she was staggering and nearly fell but did not actually fall. During my  evaluation at the bedside patient is alert but not oriented.  Patient is pleasantly confused.   ED Course:  At presentation to ED patient found to be hypertensive blood pressure 171/91.  Blood pressure is still upper 150s range.  Otherwise hemodynamically stable. CMP showed low sodium 121, low bicarb 81, elevated bilirubin 1.5.  No evidence of AKI.  Decrease from osmolarity 258.  Urine sodium 81. UA evidence of UTI.  Pending urine culture. CBC unremarkable. Troponin within normal range. Normal sinus rhythm heart rate 8 85, premature atrial complex, prolonged PR interval.  CT head no acute intracranial abnormality. Chest x-ray low lung volume without any evidence of active disease process.  While in the ED patient continues to be is confused and restless.  Received Haldol.  ED physician reported that patient has previous hospital admission due to delirium, acute metabolic encephalopathy in the setting of acute on chronic hyponatremia.  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   Per chart review patient has been recently started on Cymbalta  6/2 p.o. daily seems like that seems starting Cymbalta  patient has more due to hyponatremia secondary to SIADH.  In the ED patient has been given ceftriaxone, Haldol and 500 mL mL of LR bolus.  Hospitalist has been consulted for further evaluation management of acute metabolic encephalopathy in  the setting of hyponatremia and acute cystitis.  Significant labs in the ED: Lab Orders         Urine Culture         Comprehensive metabolic panel         CBC         Urinalysis, Routine w reflex microscopic -Urine, Clean Catch         Ammonia         Osmolality         Sodium, urine, random         Basic metabolic panel         TSH         Osmolality, urine         CBC         CBG monitoring, ED       Review of Systems:  Review of Systems  Unable to perform ROS:  Mental status change    Past Medical History:  Diagnosis Date   Anxiety    Blood loss anemia 09/13/2023   Cataract 1997   Colon cancer screening 02/05/2018   DDD (degenerative disc disease)    chronic back pain   ETD (eustachian tube dysfunction)    GERD (gastroesophageal reflux disease)    Hypertension 2022   IBS (irritable bowel syndrome)    Meniere's disease    Migraine    still gets visual aura from time to time   Mild cognitive impairment    Osteoporosis    Sleep apnea    CPAP   Stroke (HCC) 2022   Syncopal episodes    after work-up - possible seizures?   Thyroid  disease    hypothyroid   TIA (transient ischemic attack) 11/09/2020    Past Surgical History:  Procedure Laterality Date   APPENDECTOMY     CATARACT EXTRACTION W/PHACO Right 05/21/2019   Procedure: CATARACT EXTRACTION PHACO AND INTRAOCULAR LENS PLACEMENT (IOC) RIGHT panoptix lens  00:35.0  21.7%  7.61;  Surgeon: Annell Kidney, MD;  Location: [facility];  Service: Ophthalmology;  Laterality: Right;  sleep apnea requests early   CATARACT EXTRACTION W/PHACO Left 06/11/2019   Procedure: CATARACT EXTRACTION PHACO AND INTRAOCULAR LENS PLACEMENT (IOC) LEFT PANOPTIX TORIC LENS  00:50.0  20.3%  10.21;  Surgeon: Annell Kidney, MD;  Location: [facility];  Service: Ophthalmology;  Laterality: Left;  sleep apnea requests early   COLONOSCOPY     last 2012 - [facility]   ESOPHAGOGASTRODUODENOSCOPY     Multiple, last 01/07/2017 with Savary dilation to 18 mm   EYE SURGERY  August 2022   Cataracs removed   HEMORRHOID BANDING  2018   [facility]   INTRAMEDULLARY (IM) NAIL INTERTROCHANTERIC Right 09/06/2023   Procedure: INTRAMEDULLARY (IM) NAIL INTERTROCHANTERIC;  Surgeon: Hardy Lia, MD;  Location: [facility];  Service: Orthopedics;  Laterality: Right;   LUMBAR DISC SURGERY  1984   L5      reports that she has never smoked. She has never been exposed to tobacco smoke. She has never used smokeless  tobacco. She reports current alcohol use. She reports current drug use. Drug: Marijuana.  Allergies  Allergen Reactions   Aspirin  Other (See Comments)    GI intolerance    Nsaids     GI discomfort and gastritis   Tolmetin Other (See Comments)    GI discomfort and gastritis    Family History  Problem Relation Age of Onset   Diabetes Paternal Aunt    Breast cancer Paternal Aunt  Hypertension Maternal Grandmother    Hypertension Maternal Grandfather    Hypertension Sister     Prior to Admission medications   Medication Sig Start Date End Date Taking? Authorizing Provider  acetaminophen  (TYLENOL ) 500 MG tablet Take 500 mg by mouth every 6 (six) hours as needed for moderate pain (pain score 4-6).    [provider]  amLODipine  (NORVASC ) 5 MG tablet Take 1/2 tablet (2.5 mg) by mouth daily 01/21/24   Tower, Manley Seeds, MD  B Complex-C (SUPER B COMPLEX PO) Take 1 tablet by mouth daily.    [provider]  butalbital -acetaminophen -caffeine  (FIORICET) 50-325-40 MG tablet Take 1 tablet by mouth every 6 (six) hours as needed. 03/20/23   Tower, Manley Seeds, MD  cholecalciferol  (VITAMIN D ) 25 MCG (1000 UNIT) tablet Take 1,000 Units by mouth daily.    [provider]  diclofenac  sodium (VOLTAREN ) 1 % GEL APPLY 2 GRAMS TOPICALLY FOUR TIMES A DAY TO AFFECTED AREAS Patient taking differently: Apply 2 g topically 2 (two) times daily. 05/24/18   Tower, Manley Seeds, MD  DULoxetine  (CYMBALTA ) 30 MG capsule Take 1 capsule (30 mg total) by mouth daily. 01/21/24   Tower, Manley Seeds, MD  estradiol  (ESTRACE ) 0.1 MG/GM vaginal cream INSERT SMALL AMOUNT (1 CM) VAGINALLY EVERY OTHER DAY 07/04/22   Tower, Manley Seeds, MD  famotidine  (PEPCID ) 40 MG tablet Take 40 mg by mouth daily as needed for heartburn or indigestion. 08/20/23   [provider]  levothyroxine  (SYNTHROID ) 75 MCG tablet Take 1 tablet (75 mcg total) by mouth daily before breakfast. 11/09/23   Tower, Manley Seeds, MD  linaclotide  (LINZESS )  145 MCG CAPS capsule Take 1 capsule (145 mcg total) by mouth as needed. 03/13/22   Tower, Manley Seeds, MD  LORazepam  (ATIVAN ) 0.5 MG tablet Take 1 tablet (0.5 mg total) by mouth 2 (two) times daily as needed for anxiety. 09/17/23   Daren Eck, DO  metoprolol  tartrate (LOPRESSOR ) 25 MG tablet Take 0.5 tablets (12.5 mg total) by mouth 2 (two) times daily. 12/24/23   Tower, Manley Seeds, MD  mometasone (NASONEX) 50 MCG/ACT nasal spray Place 2 sprays into the nose daily as needed (Rhinitis).    [provider]  omeprazole  (PRILOSEC) 40 MG capsule Take 1 capsule (40 mg total) by mouth daily. 11/09/23   Tower, Manley Seeds, MD  sodium chloride  1 g tablet Take 1 tablet (1 g total) by mouth 2 (two) times daily with a meal. 09/17/23   Daren Eck, DO  sucralfate  (CARAFATE ) 1 g tablet Take 1 g by mouth 4 (four) times daily as needed (Esophagitis).    [provider]     Physical Exam: Vitals:   01/28/24 1530 01/28/24 1545 01/28/24 2017 01/29/24 0018  BP: (!) 151/117 (!) 158/62 (!) 155/106 (!) 157/71  Pulse:   66 80  Resp: 19 16 15 16   Temp:   98.1 F (36.7 C) 98.3 F (36.8 C)  TempSrc:   Oral Oral  SpO2:   100% 100%    Physical Exam Vitals and nursing note reviewed.  Constitutional:      Comments: Alert and oriented to self only.  HENT:     Head: Normocephalic and atraumatic.     Nose: Nose normal.     Mouth/Throat:     Mouth: Mucous membranes are moist.  Eyes:     Pupils: Pupils are equal, round, and reactive to light.  Cardiovascular:     Rate and Rhythm: Normal rate and regular rhythm.  Pulses: Normal pulses.     Heart sounds: Normal heart sounds.  Pulmonary:     Effort: Pulmonary effort is normal.     Breath sounds: Normal breath sounds.  Abdominal:     Palpations: Abdomen is soft.  Musculoskeletal:     Cervical back: Neck supple.     Right lower leg: No edema.     Left lower leg: No edema.  Skin:    General: Skin is warm.     Capillary Refill: Capillary refill  takes less than 2 seconds.  Neurological:     Mental Status: She is alert.     Comments: Alert and oriented to self.  Pleasantly confused  Psychiatric:     Comments: Unable to assess      Labs on Admission: I have personally reviewed following labs and imaging studies  CBC: Recent Labs  Lab 01/28/24 1225  WBC 7.6  HGB 12.3  HCT 36.2  MCV 86.2  PLT 229   Basic Metabolic Panel: Recent Labs  Lab 01/28/24 1225  NA 121*  K 3.5  CL 89*  CO2 22  GLUCOSE 178*  BUN 15  CREATININE 0.72  CALCIUM  8.4*   GFR: Estimated Creatinine Clearance: 46.8 mL/min (by C-G formula based on SCr of 0.72 mg/dL). Liver Function Tests: Recent Labs  Lab 01/28/24 1225  AST 30  ALT 16  ALKPHOS 76  BILITOT 1.5*  PROT 6.4*  ALBUMIN 3.7   No results for input(s): "LIPASE", "AMYLASE" in the last 168 hours. Recent Labs  Lab 01/28/24 1402  AMMONIA <13   Coagulation Profile: No results for input(s): "INR", "PROTIME" in the last 168 hours. Cardiac Enzymes: Recent Labs  Lab 01/28/24 1228  TROPONINIHS 12   BNP (last 3 results) No results for input(s): "BNP" in the last 8760 hours. HbA1C: No results for input(s): "HGBA1C" in the last 72 hours. CBG: Recent Labs  Lab 01/28/24 1953  GLUCAP 102*   Lipid Profile: No results for input(s): "CHOL", "HDL", "LDLCALC", "TRIG", "CHOLHDL", "LDLDIRECT" in the last 72 hours. Thyroid  Function Tests: No results for input(s): "TSH", "T4TOTAL", "FREET4", "T3FREE", "THYROIDAB" in the last 72 hours. Anemia Panel: No results for input(s): "VITAMINB12", "FOLATE", "FERRITIN", "TIBC", "IRON", "RETICCTPCT" in the last 72 hours. Urine analysis:    Component Value Date/Time   COLORURINE YELLOW 01/28/2024 1447   APPEARANCEUR CLEAR 01/28/2024 1447   LABSPEC 1.008 01/28/2024 1447   PHURINE 6.0 01/28/2024 1447   GLUCOSEU 50 (A) 01/28/2024 1447   HGBUR NEGATIVE 01/28/2024 1447   HGBUR negative 10/06/2008 1447   BILIRUBINUR NEGATIVE 01/28/2024 1447    BILIRUBINUR Negative 09/03/2023 0948   KETONESUR 5 (A) 01/28/2024 1447   PROTEINUR NEGATIVE 01/28/2024 1447   UROBILINOGEN 0.2 09/03/2023 0948   UROBILINOGEN 0.2 10/06/2008 1447   NITRITE NEGATIVE 01/28/2024 1447   LEUKOCYTESUR MODERATE (A) 01/28/2024 1447    Radiological Exams on Admission: I have personally reviewed images CT Head Wo Contrast Result Date: 01/28/2024 CLINICAL DATA:  Mental status change, unknown cause EXAM: CT HEAD WITHOUT CONTRAST TECHNIQUE: Contiguous axial images were obtained from the base of the skull through the vertex without intravenous contrast. RADIATION DOSE REDUCTION: This exam was performed according to the departmental dose-optimization program which includes automated exposure control, adjustment of the mA and/or kV according to patient size and/or use of iterative reconstruction technique. COMPARISON:  CT head 11/15/2020 FINDINGS: Brain: Patchy and confluent areas of decreased attenuation are noted throughout the deep and periventricular white matter of the cerebral hemispheres bilaterally, compatible with  chronic microvascular ischemic disease. No evidence of large-territorial acute infarction. No parenchymal hemorrhage. No mass lesion. No extra-axial collection. No mass effect or midline shift. No hydrocephalus. Basilar cisterns are patent. Vascular: No hyperdense vessel. Atherosclerotic calcifications are present within the cavernous internal carotid arteries. Skull: No acute fracture or focal lesion. Sinuses/Orbits: Paranasal sinuses and mastoid air cells are clear. Bilateral lens replacement. Otherwise the orbits are unremarkable. Other: None. IMPRESSION: No acute intracranial abnormality. Electronically Signed   By: Morgane  Naveau M.D.   On: 01/28/2024 15:51   DG Chest Portable 1 View Result Date: 01/28/2024 CLINICAL DATA:  Syncope. EXAM: PORTABLE CHEST 1 VIEW COMPARISON:  September 05, 2023 FINDINGS: The heart size and mediastinal contours are within normal limits.  Low lung volumes are noted. There is no evidence of acute infiltrate, pleural effusion or pneumothorax. Multilevel degenerative changes seen throughout the thoracic spine. IMPRESSION: Low lung volumes without evidence of acute or active cardiopulmonary disease. Electronically Signed   By: Virgle Grime M.D.   On: 01/28/2024 14:08     EKG: My personal interpretation of EKG shows: EKG showing normal sinus rhythm heart rate 85, premature atrial complex and prolonged PR interval.   Assessment/Plan: Principal Problem:   Acute metabolic encephalopathy Active Problems:   Hypothyroidism   Hyperosmolar hyponatremia   GAD (generalized anxiety disorder)   Essential hypertension   Acute cystitis   Vitamin D  deficiency    Assessment and Plan: Acute metabolic encephalopathy-in the setting of UTI and hyponatremia -Patient presented emergency department with them near syncopal episode.  Husband reported that patient has been seems confused over the course of last few days.  Hemodynamically stable except blood pressure in the high range. - At the bedside patient is alert oriented to self.  However she becomes agitated time to time in the ED patient received Haldol. -CBC unremarkable.  CMP showing low sodium 121, low chloride 89, elevated bilirubin 1.5.  Normal ammonia level. UA showing evidence of UTI and pending urine culture. - CT head no acute intracranial abnormality. - Concern for acute metabolic encephalopathy in the setting of UTI and hyponatremia.  Treating for both UTI and hyponatremia as mentioned below. -Continue delirium precaution, fall precaution and keep the bed alarm on.   Acute cystitis -Unable to obtain much history regarding symptoms of UTI.  However currently hemodynamically stable evidence of leukocytosis and patient is afebrile. -Continue IV ceftriaxone 1 g daily. - Pending urine culture result.  Hypoosmolar hyponatremia history of previous hypoosmolar hyponatremia in the  setting of SIADH due to Bactrim  use -Recently been started on Cymbalta  6/2. -Low serum sodium 121 and serum osmolarity 258.  Urine sodium 81.  Checking urine osmolarity and TSH level.  Euvolemic physical exam. -Hyponatremia in the setting of recent Cymbalta  initiation on 01/21/2024 - Concern for development of SIADH in the setting of hyponatremia. - Continue fluid restriction 1.2 L/day - If patient develops symptomatic hyponatremia include seizure and altered mental status will give sodium bolus. - Goal to improve sodium 8 to 10 mEq over the course of next 24 hours.   -If with fluid restriction fails to improve sodium level in that case will give oral sodium chloride  tablet, urea or tolvaptan. -Completely stopping Protonix  and Cymbalta  as it will provoke further hyponatremia.   Hypothyroidism -Continue levothyroxine   Generalized anxiety disorder -Patient being recently started on Cymbalta  on 01/21/2024.  Need to discontinue Cymbalta   in the setting of hyponatremia.  Essential hypertension Continue amlodipine  2.5 mg daily, metoprolol  12.5 mg twice daily and  IV hydralazine  as needed.  History of hip fracture status post nailing repair in January 2025 -Continue to monitor for fall precaution.  GERD - Discontinue Protonix  as it will provoke SIADH associated hyponatremia.    DVT prophylaxis:  Lovenox  Code Status:  Full Code Diet: Heart healthy diet. Family Communication:   Patient's husband is available over phone. Disposition Plan: Continue to monitor improvement of sodium level and confusion.  Pending urine culture result. Consults: None indicated at this time Admission status:   Observation, Step Down Unit  Severity of Illness: The appropriate patient status for this patient is OBSERVATION. Observation status is judged to be reasonable and necessary in order to provide the required intensity of service to ensure the patient's safety. The patient's presenting symptoms, physical exam  findings, and initial radiographic and laboratory data in the context of their medical condition is felt to place them at decreased risk for further clinical deterioration. Furthermore, it is anticipated that the patient will be medically stable for discharge from the hospital within 2 midnights of admission.     Saavi Mceachron, MD [facility]  How to contact the [facility] Attending or Consulting provider 7A - 7P or covering provider during after hours 7P -7A, for this patient.  Check the care team in [facility] and look for a) attending/consulting [facility] provider listed and b) the [facility] team listed Log into [email or web address] and use [facility]'s universal password to access. If you do not have the password, please contact the hospital operator. Locate the [facility] provider you are looking for under [facility] and page to a number that you can be directly reached. If you still have difficulty reaching the provider, please page the [facility] (Director on Call) for the Hospitalists listed on amion for assistance.  01/29/2024, 3:49 AM

## 2024-01-29 NOTE — Hospital Course (Addendum)
 Same day note  Sarah Harper is a 83 y.o. female with past medical history significant for essential hypertension, generalized anxiety disorder, hypothyroidism, vitamin B12 and vitamin D  deficiency presented to the emergency department for evaluation for syncopal episode at home. Patient's husband reported that she has been seemed confused over the course of last 1 week corresponds with her taking a new blood pressure pill.  She was also recently started on Cymbalta .  No other new medications.  She has been acting differently, especially asking strange questions and going to bed earlier and staying in bed more often.  EMS was called.  In the ED patient was hypertensive.  Initial labs showed sodium of 121.    Urine sodium 81. Urinalysis with red cells.  CBC was unremarkable.  Troponin within normal range.  EKG showed normal sinus rhythm.  CT head without any acute findings.  Chest x-ray with low lung volumes.  In the ED patient was confused and restless and received Haldol.  Patient was then considered for admission to the hospital for further evaluation and treatment.  Patient seen and examined at bedside.  Patient was admitted to the hospital for confusion and syncope  At the time of my evaluation, patient complains of  Physical examination reveals  Laboratory data and imaging was reviewed  Assessment and Plan.  Acute metabolic encephalopathy likely secondary to UTI and hyponatremia. Recent syncopal episode and confusion.  Was oriented to self in the ED.  Initial sodium was 121.  CT head negative.  Continue to monitor closely.  Delirium precautions.  Treat for UTI.  Check BMP in p.m.  Syncopal episode likely secondary to hyponatremia UTI.  Continue telemetry monitor.  Will check 2D echocardiogram.   Acute cystitis Check urine culture.  Continue Rocephin IV  Hypokalemia.  Potassium was 2.5 today.  Will replace through IV and orally..  Check levels in AM.  Hypoosmolar  hyponatremia history of previous hypoosmolar hyponatremia and SIADH. Euvolemic on exam.  Recently been started on Cymbalta  6/2.  Continue to hold Cymbalta .  TSH of 6.5.  Serum osmolality low.  Likely SIADH.  Continue fluid restriction 1.2 L. If with fluid restriction fails to improve sodium level in that case will give oral sodium chloride  tablet, urea or tolvaptan. Stop Protonix  and Cymbalta .  Sodium level today at 124 from 121 yesterday.  Check BMP in p.m. and again in AM.   Hypothyroidism Continue levothyroxine .  TSH borderline high at 6.5.   Generalized anxiety disorder Was recently started on Cymbalta .  Will hold off for now in the context of hyponatremia.   Essential hypertension Continue amlodipine  metoprolol  and hydralazine .  Continue to monitor blood pressure.   History of hip fracture status post nailing repair in January 2025 Continue physical therapy.  Fall precautions.   GERD Discontinue Protonix  for now in the context of hyponatremia.    No Charge  Signed,  Lindwood Rhody, MD [facility]

## 2024-01-29 NOTE — Progress Notes (Signed)
 Patient continues to pull off leads and wires and try to get out bed .  IV Haldol given x 2.  It has not been effective.  Asked for sitter for  the patient , but there is not one available at this time.  Patient will need a sitter if her family doesn't come and stay with her today.

## 2024-01-29 NOTE — Care Management Obs Status (Signed)
 MEDICARE OBSERVATION STATUS NOTIFICATION   Patient Details  Name: Sarah Harper MRN: [id number] Date of Birth: 09-21-1940   Medicare Observation Status Notification Given:  Yes  Letter signed and copy given  Wynonia Hedges 01/29/2024, 3:09 PM

## 2024-01-30 DIAGNOSIS — G9341 Metabolic encephalopathy: Secondary | ICD-10-CM | POA: Diagnosis not present

## 2024-01-30 LAB — BASIC METABOLIC PANEL WITH GFR
Anion gap: 11 (ref 5–15)
Anion gap: 11 (ref 5–15)
BUN: 7 mg/dL — ABNORMAL LOW (ref 8–23)
BUN: 12 mg/dL (ref 8–23)
CO2: 24 mmol/L (ref 22–32)
CO2: 23 mmol/L (ref 22–32)
Calcium: 9.4 mg/dL (ref 8.9–10.3)
Calcium: 9.5 mg/dL (ref 8.9–10.3)
Chloride: 88 mmol/L — ABNORMAL LOW (ref 98–111)
Chloride: 90 mmol/L — ABNORMAL LOW (ref 98–111)
Creatinine, Ser: 0.57 mg/dL (ref 0.44–1.00)
Creatinine, Ser: 0.6 mg/dL (ref 0.44–1.00)
GFR, Estimated: >60 mL/min (ref >60)
GFR, Estimated: >60 mL/min (ref >60)
Glucose, Bld: 142 mg/dL — ABNORMAL HIGH (ref 70–99)
Glucose, Bld: 133 mg/dL — ABNORMAL HIGH (ref 70–99)
Potassium: 3.8 mmol/L (ref 3.5–5.1)
Potassium: 4.3 mmol/L (ref 3.5–5.1)
Sodium: 123 mmol/L — ABNORMAL LOW (ref 135–145)
Sodium: 124 mmol/L — ABNORMAL LOW (ref 135–145)

## 2024-01-30 LAB — CBC
HCT: 39.5 % (ref 36.0–46.0)
Hemoglobin: 13.7 g/dL (ref 12.0–15.0)
MCH: 28.8 pg (ref 26.0–34.0)
MCHC: 34.7 g/dL (ref 30.0–36.0)
MCV: 83 fL (ref 80.0–100.0)
Platelets: 277 10*3/uL (ref 150–400)
RBC: 4.76 MIL/uL (ref 3.87–5.11)
RDW: 13 % (ref 11.5–15.5)
WBC: 11.1 10*3/uL — ABNORMAL HIGH (ref 4.0–10.5)
nRBC: 0 % (ref 0.0–0.2)

## 2024-01-30 LAB — MAGNESIUM: Magnesium: 1.5 mg/dL — ABNORMAL LOW (ref 1.7–2.4)

## 2024-01-30 LAB — URINE CULTURE: Culture: >=100000 — AB

## 2024-01-30 LAB — OSMOLALITY, URINE: Osmolality, Ur: 537 mosm/kg (ref 300–900)

## 2024-01-30 LAB — PHOSPHORUS: Phosphorus: 2.1 mg/dL — ABNORMAL LOW (ref 2.5–4.6)

## 2024-01-30 LAB — PROCALCITONIN: Procalcitonin: <0.1 ng/mL

## 2024-01-30 MED ORDER — ENSURE PLUS HIGH PROTEIN PO LIQD
237.0000 mL | Freq: Three times a day (TID) | ORAL | Status: DC
  Administered 2024-01-30 – 2024-02-03 (×11): 237 mL via ORAL

## 2024-01-30 MED ORDER — MAGNESIUM SULFATE 4 GM/100ML IV SOLN
4.0000 g | Freq: Once | INTRAVENOUS | Status: AC
  Administered 2024-01-30: 4 g via INTRAVENOUS
  Filled 2024-01-30: qty 100

## 2024-01-30 MED ORDER — SODIUM CHLORIDE 0.9 % IV SOLN
1.0000 g | Freq: Three times a day (TID) | INTRAVENOUS | Status: DC
  Administered 2024-01-30 – 2024-02-03 (×14): 1 g via INTRAVENOUS
  Filled 2024-01-30 (×16): qty 1000

## 2024-01-30 MED ORDER — K PHOS MONO-SOD PHOS DI & MONO 155-852-130 MG PO TABS
500.0000 mg | ORAL_TABLET | Freq: Four times a day (QID) | ORAL | Status: AC
  Administered 2024-01-30 (×3): 500 mg via ORAL
  Filled 2024-01-30 (×3): qty 2

## 2024-01-30 MED ORDER — SODIUM CHLORIDE 0.9 % IV SOLN
1.0000 g | INTRAVENOUS | Status: DC
  Filled 2024-01-30: qty 1000

## 2024-01-30 MED ORDER — QUETIAPINE FUMARATE 25 MG PO TABS
25.0000 mg | ORAL_TABLET | ORAL | Status: DC
  Administered 2024-01-30 – 2024-02-03 (×5): 25 mg via ORAL
  Filled 2024-01-30 (×5): qty 1

## 2024-01-30 NOTE — Progress Notes (Signed)
   01/30/24 1557  TOC Brief Assessment  Insurance and Status Reviewed Administrator Medicare HMO/PPO)  Home environment has been reviewed From Home with Husband  Prior level of function: Moderately independent w/ RW  Prior/Current Home Services Current home services ([facility] [place])  Social Drivers of Health Review SDOH reviewed no interventions necessary  Readmission risk has been reviewed Yes (18%)  Transition of care needs transition of care needs identified, TOC will continue to follow   Patient from home with Husband  [facility] had provided [facility] in the past. Liaison is Leslee Rase [phone]. Patient is being recommended SNF  If she dc's to home- We will reinstate [facility] for services AND order a BSC.

## 2024-01-30 NOTE — Plan of Care (Signed)
   Problem: Education: Goal: Knowledge of General Education information will improve Description: Including pain rating scale, medication(s)/side effects and non-pharmacologic comfort measures Outcome: Progressing   Problem: Safety: Goal: Ability to remain free from injury will improve Outcome: Progressing

## 2024-01-30 NOTE — Evaluation (Signed)
 Physical Therapy Evaluation Patient Details Name: Sarah Harper MRN: [id number] DOB: July 03, 1941 Today's Date: 01/30/2024  History of Present Illness  Patient is an 83 y.o. female presented to the ED on 01/29/24 for evaluation for syncopal episode at home. Pt's spouse reports increased confusion in week PTA. Pt hypertensive in ED. PT admitted for encephalopathy suspected due to UTI. PMH significant for HTN, GERD, osteopenia, CVA, syncopal episodes, TIA, DDD, anxiety,  R hip fx s/p IMN 09/06/23.   Clinical Impression  Sarah Harper is 83 y.o. female admitted with above HPI and diagnosis. Patient is currently limited by functional impairments below (see PT problem list). Patient lives with spouse and is mod ind with RW at baseline. Currently pt limited by weakness, balance impairments, and confusion. Pt requires min-mod assist for bed mob, and min assit with RW for transfers and gait, completed ~80' with assist to steady and prevent LOB. Steps shuffled with low foot clearance throughout. VSS on RA. Patient will benefit from continued skilled PT interventions to address impairments and progress independence with mobility. Patient will benefit from continued inpatient follow up therapy, <3 hours/day. Acute PT will follow and progress as able.         If plan is discharge home, recommend the following: A little help with walking and/or transfers;A little help with bathing/dressing/bathroom;Assistance with cooking/housework;Direct supervision/assist for medications management;Assist for transportation;Help with stairs or ramp for entrance;Supervision due to cognitive status   Can travel by private vehicle   Yes    Equipment Recommendations BSC/3in1  Recommendations for Other Services       Functional Status Assessment Patient has had a recent decline in their functional status and demonstrates the ability to make significant improvements in function in a reasonable and predictable amount of time.      Precautions / Restrictions Precautions Precautions: Fall Restrictions Weight Bearing Restrictions Per Provider Order: No      Mobility  Bed Mobility Overal bed mobility: Needs Assistance Bed Mobility: Supine to Sit, Sit to Supine     Supine to sit: HOB elevated, Used rails, Mod assist Sit to supine: Mod assist   General bed mobility comments: mod assist to guide hips and raise trunk for supine>sit. Assist to bring LE's back onto bed.    Transfers Overall transfer level: Needs assistance Equipment used: Rolling walker (2 wheels) Transfers: Sit to/from Stand Sit to Stand: Min assist           General transfer comment: min assist with cues for hand placement and to prevent psoteiro LOB.    Ambulation/Gait Ambulation/Gait assistance: Min assist Gait Distance (Feet): 80 Feet Assistive device: Rolling walker (2 wheels) Gait Pattern/deviations: Step-through pattern, Decreased step length - right, Decreased step length - left, Decreased stride length, Shuffle, Trunk flexed Gait velocity: decr     General Gait Details: assist to steady due to post bias and low shuffled steps.  Stairs            Wheelchair Mobility     Tilt Bed    Modified Rankin (Stroke Patients Only)       Balance Overall balance assessment: Needs assistance Sitting-balance support: Feet supported Sitting balance-Leahy Scale: Poor Sitting balance - Comments: CGA- min A   Standing balance support: Bilateral upper extremity supported, During functional activity Standing balance-Leahy Scale: Poor Standing balance comment: posterior bais upon standing  Pertinent Vitals/Pain Pain Assessment Pain Assessment: PAINAD Faces Pain Scale: No hurt Breathing: normal Negative Vocalization: none Facial Expression: smiling or inexpressive Body Language: relaxed Consolability: no need to console PAINAD Score: 0 Pain Intervention(s): Monitored during  session, Repositioned    Home Living Family/patient expects to be discharged to:: Private residence Living Arrangements: Spouse/significant other Available Help at Discharge: Family;Available 24 hours/day Type of Home: House Home Access: Stairs to enter Entrance Stairs-Rails: Can reach both Entrance Stairs-Number of Steps: 2   Home Layout: One level Home Equipment: Grab bars - tub/shower;Toilet riser;Rolling Environmental consultant (2 wheels) Additional Comments: gleaned from 09/07/23 admission    Prior Function Prior Level of Function : Independent/Modified Independent;Driving             Mobility Comments: 09/07/23 pt was indep without AD; pt presented with impaired cog, but states she sometimes uses the walker ADLs Comments: 09/07/23 pt was indep with ADLs and driving - pt unable to confirm PLOF on eval     Extremity/Trunk Assessment   Upper Extremity Assessment Upper Extremity Assessment: Generalized weakness;Defer to OT evaluation    Lower Extremity Assessment Lower Extremity Assessment: Generalized weakness    Cervical / Trunk Assessment Cervical / Trunk Assessment: Kyphotic  Communication   Communication Communication: No apparent difficulties    Cognition Arousal: Alert Behavior During Therapy: Impulsive   PT - Cognitive impairments: Awareness, Initiation, Sequencing, Problem solving, Safety/Judgement                       PT - Cognition Comments: pt confused, states at a house at dinner group. pt Following commands: Impaired Following commands impaired: Follows one step commands inconsistently     Cueing Cueing Techniques: Verbal cues     General Comments      Exercises     Assessment/Plan    PT Assessment Patient needs continued PT services  PT Problem List Decreased strength;Decreased range of motion;Decreased activity tolerance;Decreased balance;Decreased mobility;Decreased coordination;Decreased cognition;Decreased knowledge of use of DME;Decreased  safety awareness;Cardiopulmonary status limiting activity;Decreased knowledge of precautions       PT Treatment Interventions DME instruction;Gait training;Stair training;Functional mobility training;Therapeutic activities;Therapeutic exercise;Balance training;Neuromuscular re-education;Cognitive remediation;Patient/family education    PT Goals (Current goals can be found in the Care Plan section)  Acute Rehab PT Goals Patient Stated Goal: get better and home PT Goal Formulation: With patient/family Time For Goal Achievement: 02/13/24 Potential to Achieve Goals: Good    Frequency Min 2X/week     Co-evaluation               AM-PAC PT 6 Clicks Mobility  Outcome Measure Help needed turning from your back to your side while in a flat bed without using bedrails?: A Little Help needed moving from lying on your back to sitting on the side of a flat bed without using bedrails?: A Lot Help needed moving to and from a bed to a chair (including a wheelchair)?: A Little Help needed standing up from a chair using your arms (e.g., wheelchair or bedside chair)?: A Little Help needed to walk in hospital room?: A Little Help needed climbing 3-5 steps with a railing? : Total 6 Click Score: 15    End of Session Equipment Utilized During Treatment: Gait belt Activity Tolerance: Patient tolerated treatment well Patient left: with call bell/phone within reach;in bed;with bed alarm set;with family/visitor present Nurse Communication: Mobility status PT Visit Diagnosis: Other abnormalities of gait and mobility (R26.89);Muscle weakness (generalized) (M62.81);History of falling (Z91.81);Difficulty in walking, not elsewhere  classified (R26.2);Other symptoms and signs involving the nervous system (R29.898)    Time: 5621-3086 PT Time Calculation (min) (ACUTE ONLY): 35 min   Charges:   PT Evaluation $PT Eval Moderate Complexity: 1 Mod PT Treatments $Gait Training: 8-22 mins PT General  Charges $$ ACUTE PT VISIT: 1 Visit         Tish Forge, DPT Acute Rehabilitation Services Office [phone]  01/30/24 3:20 PM

## 2024-01-30 NOTE — Evaluation (Signed)
 Occupational Therapy Evaluation Patient Details Name: Sarah Harper MRN: [id number] DOB: 09-Feb-1941 Today's Date: 01/30/2024   History of Present Illness   Patient is an 83 y.o. female presented to the ED on 01/29/24 for evaluation for syncopal episode at home. Pt's spouse reports increased confusion in week PTA. Pt hypertensive in ED. PT admitted for encephalopathy suspected due to UTI. PMH significant for HTN, GERD, osteopenia, CVA, syncopal episodes, TIA, DDD, anxiety,  R hip fx s/p IMN 09/06/23.        Clinical Impressions Euretha was evaluated s/p the above admission list. Per chart review from 09/07/2023 admission, pt was independent and lives with her husband. Pt presents with confusion and is unable to confirm PLOF or home set up. Upon evaluation the pt was limited by impaired cognition, confusion, R inattention, generalized weakness, high fall risk and limited activity tolerance. Overall she required mod-max A for bed mobility and min A to transfer and mobilize in the room with a RW. Due to the deficits listed below the pt also needs up to max/total A for LB ADLs and min/mod A for LB ADLs. Anticipate once pt's cognition clears she will be able to participate in functional tasks more independently. Pt will benefit from continued acute OT services and skilled inpatient follow up therapy, <3 hours/day due to significant fall risk and confusion at the time of evaluation.      If plan is discharge home, recommend the following:   A lot of help with walking and/or transfers;A lot of help with bathing/dressing/bathroom;Assistance with cooking/housework;Assist for transportation;Help with stairs or ramp for entrance;Supervision due to cognitive status;Direct supervision/assist for medications management;Assistance with feeding;Direct supervision/assist for financial management     Functional Status Assessment   Patient has had a recent decline in their functional status and demonstrates the  ability to make significant improvements in function in a reasonable and predictable amount of time.     Equipment Recommendations   None recommended by OT      Precautions/Restrictions   Precautions Precautions: Fall Restrictions Weight Bearing Restrictions Per Provider Order: No     Mobility Bed Mobility Overal bed mobility: Needs Assistance Bed Mobility: Supine to Sit, Sit to Supine     Supine to sit: Max assist Sit to supine: Mod assist   General bed mobility comments: pt attempting to get OOB to the L despite the rail being down and the therapist located on the R. Max A needed to bring legs to EOB and elevate trunk    Transfers Overall transfer level: Needs assistance Equipment used: Rolling walker (2 wheels) Transfers: Sit to/from Stand Sit to Stand: Min assist           General transfer comment: min A to ambulate from teh door and back wtih RW, min A to manage RW and for balance. Pt with short shuffling gait.      Balance Overall balance assessment: Needs assistance Sitting-balance support: Feet supported Sitting balance-Leahy Scale: Poor Sitting balance - Comments: CGA- min A   Standing balance support: Bilateral upper extremity supported, During functional activity Standing balance-Leahy Scale: Poor Standing balance comment: posterior bais upon standing                           ADL either performed or assessed with clinical judgement   ADL Overall ADL's : Needs assistance/impaired Eating/Feeding: Minimal assistance   Grooming: Moderate assistance   Upper Body Bathing: Maximal assistance   Lower Body Bathing:  Maximal assistance   Upper Body Dressing : Moderate assistance   Lower Body Dressing: Total assistance   Toilet Transfer: Minimal assistance;Ambulation;Rolling walker (2 wheels);BSC/3in1   Toileting- Clothing Manipulation and Hygiene: Total assistance       Functional mobility during ADLs: Minimal assistance General  ADL Comments: significant cues needed for initiation, sequencing and safety. Pt has a short shuffle gait. Notable R inattention throughout     Vision Baseline Vision/History: 0 No visual deficits Vision Assessment?: Vision impaired- to be further tested in functional context Additional Comments: R inattention, unable to formally assess due to cognition     Perception Perception: Impaired Preception Impairment Details: Inattention/Neglect Perception-Other Comments: R inattention   Praxis Praxis: Not tested       Pertinent Vitals/Pain Pain Assessment Pain Assessment: Faces Faces Pain Scale: No hurt     Extremity/Trunk Assessment Upper Extremity Assessment Upper Extremity Assessment: Generalized weakness   Lower Extremity Assessment Lower Extremity Assessment: Defer to PT evaluation   Cervical / Trunk Assessment Cervical / Trunk Assessment: Kyphotic   Communication Communication Communication: No apparent difficulties   Cognition Arousal: Alert Behavior During Therapy: Impulsive Cognition: Cognition impaired   Orientation impairments: Place, Time, Situation Awareness: Intellectual awareness impaired, Online awareness impaired Memory impairment (select all impairments): Short-term memory, Working [employer] fast streamer, Non-declarative long-term memory, Geneticist, molecular long-term memory Attention impairment (select first level of impairment): Focused attention Executive functioning impairment (select all impairments): Initiation, Organization, Sequencing, Reasoning, Problem solving OT - Cognition Comments: pt able to state name and birthday, otherwise disoriented and unable to recall correct orientation after education. pt followed simple 1 step commands with constant redirection and cues. R inattention noted throughout. Impulsive with mobility, no safety awareness.                 Following commands: Impaired Following commands impaired: Follows one step commands inconsistently      Cueing  General Comments   Cueing Techniques: Verbal cues  VSS, BP and HR  assessed before and after activity    Home Living Family/patient expects to be discharged to:: Private residence Living Arrangements: Spouse/significant other Available Help at Discharge: Family;Available 24 hours/day Type of Home: House Home Access: Stairs to enter [employer] of Steps: 2 Entrance Stairs-Rails: Can reach both Home Layout: One level     Bathroom Shower/Tub: Producer, television/film/video: Handicapped height     Home Equipment: Grab bars - tub/shower;Toilet riser;Rolling Environmental consultant (2 wheels)   Additional Comments: gleaned from 09/07/23 admission      Prior Functioning/Environment Prior Level of Function : Independent/Modified Independent;Driving             Mobility Comments: 09/07/23 pt was indep without AD; pt presented with impaired cog, but states she sometimes uses the walker ADLs Comments: 09/07/23 pt was indep with ADLs and driving - pt unable to confirm PLOF on eval    OT Problem List: Decreased strength;Decreased range of motion;Decreased activity tolerance;Impaired balance (sitting and/or standing);Decreased cognition;Decreased knowledge of use of DME or AE;Decreased safety awareness;Decreased knowledge of precautions   OT Treatment/Interventions: Self-care/ADL training;Therapeutic exercise;DME and/or AE instruction;Therapeutic activities;Patient/family education      OT Goals(Current goals can be found in the care plan section)   Acute Rehab OT Goals Patient Stated Goal: unable to state OT Goal Formulation: Patient unable to participate in goal setting Time For Goal Achievement: 02/13/24 Potential to Achieve Goals: Fair ADL Goals Pt Will Perform Grooming: with set-up;sitting Pt Will Perform Upper Body Dressing: with set-up;sitting Pt Will Perform Lower  Body Dressing: with min assist;sit to/from stand Pt Will Transfer to Toilet: with  supervision;ambulating   OT Frequency:  Min 1X/week       AM-PAC OT 6 Clicks Daily Activity     Outcome Measure Help from another person eating meals?: A Little Help from another person taking care of personal grooming?: A Lot Help from another person toileting, which includes using toliet, bedpan, or urinal?: A Lot Help from another person bathing (including washing, rinsing, drying)?: A Lot Help from another person to put on and taking off regular upper body clothing?: A Lot Help from another person to put on and taking off regular lower body clothing?: A Lot 6 Click Score: 13   End of Session Equipment Utilized During Treatment: Gait belt;Rolling walker (2 wheels) Nurse Communication: Mobility status  Activity Tolerance: Patient tolerated treatment well Patient left: in bed;with call bell/phone within reach;with bed alarm set;with nursing/sitter in room  OT Visit Diagnosis: Unsteadiness on feet (R26.81);Other abnormalities of gait and mobility (R26.89);Repeated falls (R29.6);Muscle weakness (generalized) (M62.81);History of falling (Z91.81)                Time: 4098-1191 OT Time Calculation (min): 22 min Charges:  OT General Charges $OT Visit: 1 Visit OT Evaluation $OT Eval Moderate Complexity: 1 Mod  Veryl Gottron, OTR/L Acute Rehabilitation Services Office [phone] Secure Chat Communication Preferred   Starla Easterly 01/30/2024, 12:09 PM

## 2024-01-30 NOTE — Progress Notes (Signed)
 PROGRESS NOTE    Sarah Harper  YQM:[id number] DOB: 1941-05-01 DOA: 01/28/2024 PCP: Clemens Curt, MD    Chief Complaint  Patient presents with   Loss of Consciousness    Brief Narrative:    Sarah Harper is a 83 y.o. female with past medical history significant for essential hypertension, generalized anxiety disorder, hypothyroidism, vitamin B12 and vitamin D  deficiency presented to the emergency department for evaluation for syncopal episode at home. Patient's husband reported that she has been seemed confused over the course of last 1 week corresponds with her taking a new blood pressure pill.  She was also recently started on Cymbalta .  No other new medications.  She has been acting differently, especially asking strange questions and going to bed earlier and staying in bed more often.  EMS was called.  In the ED patient was hypertensive.  Initial labs showed sodium of 121.    Urine sodium 81. Urinalysis with red cells.  CBC was unremarkable.  Troponin within normal range.  EKG showed normal sinus rhythm.  CT head without any acute findings.  Chest x-ray with low lung volumes.  In the ED patient was confused and restless and received Haldol.  Patient was then considered for admission to the hospital for further evaluation and treatment. - Patient was admitted for syncope, encephalopathic, workup significant for UTI, he remained significantly confused and altered, far from her baseline..     Assessment & Plan:   Principal Problem:   Acute metabolic encephalopathy Active Problems:   Hypothyroidism   Hyperosmolar hyponatremia   GAD (generalized anxiety disorder)   Essential hypertension   Acute cystitis   Vitamin D  deficiency   Metabolic encephalopathy   Acute metabolic encephalopathy likely secondary to UTI and hyponatremia. -Patient significantly altered, confused this morning, appears more awake but oriented x 1 only . - CT head with no acute findings . - This can be  attributed to UTI, as well hyponatremia . - Possible hospital delirium as well, but will treat UTI first and hyponatremia then reassess if remains persistent despite treating UTI and hyponatremia then will consider MRI brain .    Syncopal  - Likely in the setting of severe electrolyte derangements, hyponatremia and UTI . - 2D echo with EF 75%, continue to monitor on telemetry .   UTI, Enterococcus On IV Rocephin, urine culture growing Enterococcus, antibiotic changed to IV ampicillin   Severe hypokalemia  - Replaced .  Hypomagnesemia  - Replaced   Hypophosphatemia -  replaced     Hypoosmolar hyponatremia history of previous hypoosmolar hyponatremia and SIADH. -Euvolemic . - Continue with fluid restrictions . - Will start on Ensure, , she was encouraged to increase her p.o. intake . - Cymbalta  . - Will consider salt tabs, oral urea if remains resistant to fluid restrictions .Sarah Harper   Hypothyroidism Continue levothyroxine .  TSH borderline high at 6.5.   Generalized anxiety disorder Was recently started on Cymbalta .  Will hold off for now in the context of hyponatremia.   Essential hypertension Continue amlodipine  metoprolol  and hydralazine .  Continue to monitor blood pressure.  Overall labile, but couple significant elevated reading, but overall well-controlled, on as needed hydralazine    History of hip fracture status post nailing repair in January 2025 Continue physical therapy.  Fall precautions.   GERD Discontinue Protonix  for now in the context of hyponatremia.     DVT prophylaxis: Lovenox  Code Status: Full Family Communication: none at bedside Disposition:   Status is: Inpatient  Consultants:  None   Subjective: Patient was confused overnight, this morning unable to provide any reliable history, she has a sitter at bedside.  Objective: Vitals:   01/29/24 1600 01/29/24 2024 01/30/24 0720 01/30/24 1125  BP: (!) 174/84 (!) 182/88 (!) 180/84 123/61   Pulse: 81   69  Resp: 20  15 15   Temp:  97.9 F (36.6 C) 98.6 F (37 C) 97.9 F (36.6 C)  TempSrc:  Oral Oral Oral  SpO2: 100%  97% 98%    Intake/Output Summary (Last 24 hours) at 01/30/2024 1336 Last data filed at 01/30/2024 1110 Gross per 24 hour  Intake 823.33 ml  Output 701 ml  Net 122.33 ml   There were no vitals filed for this visit.  Examination:  Awake Alert, Oriented X 1, confused, frail, chronically ill-appearing Symmetrical Chest wall movement, Good air movement bilaterally, CTAB RRR,No Gallops,Rubs or new Murmurs, No Parasternal Heave +ve B.Sounds, Abd Soft, No tenderness, No rebound - guarding or rigidity. No Cyanosis, Clubbing or edema, No new Rash or bruise      Data Reviewed: I have personally reviewed following labs and imaging studies  CBC: Recent Labs  Lab 01/28/24 1225 01/29/24 1445 01/30/24 0603  WBC 7.6 10.0 11.1*  HGB 12.3 13.0 13.7  HCT 36.2 37.5 39.5  MCV 86.2 83.7 83.0  PLT 229 270 277    Basic Metabolic Panel: Recent Labs  Lab 01/29/24 0549 01/29/24 0757 01/29/24 1355 01/29/24 1732 01/30/24 0603  NA 124* 127* 126* 125* 123*  K 2.5* 3.0* 3.7 2.7* 3.8  CL 88* 88* 90* 89* 88*  CO2 24 29 23 26 24   GLUCOSE 130* 112* 88 114* 142*  BUN 6* 7* 8 7* 7*  CREATININE 0.55 0.71 0.53 0.54 0.57  CALCIUM  8.8* 9.5 9.0 9.0 9.4  MG  --   --   --   --  1.5*  PHOS  --   --   --   --  2.1*    GFR: Estimated Creatinine Clearance: 46.8 mL/min (by C-G formula based on SCr of 0.57 mg/dL).  Liver Function Tests: Recent Labs  Lab 01/28/24 1225  AST 30  ALT 16  ALKPHOS 76  BILITOT 1.5*  PROT 6.4*  ALBUMIN 3.7    CBG: Recent Labs  Lab 01/28/24 1953  GLUCAP 102*     Recent Results (from the past 240 hours)  Urine Culture     Status: Abnormal   Collection Time: 01/28/24  8:22 PM   Specimen: Urine, Clean Catch  Result Value Ref Range Status   Specimen Description URINE, CLEAN CATCH  Final   Special Requests   Final     NONE Performed at [facility], [address], [place], [place] [address]    Culture >=100,000 COLONIES/mL ENTEROCOCCUS FAECALIS (A)  Final   Report Status 01/30/2024 FINAL  Final   Organism ID, Bacteria ENTEROCOCCUS FAECALIS (A)  Final      Susceptibility   Enterococcus faecalis - MIC*    AMPICILLIN <=2 SENSITIVE Sensitive     NITROFURANTOIN <=16 SENSITIVE Sensitive     VANCOMYCIN 1 SENSITIVE Sensitive     * >=100,000 COLONIES/mL ENTEROCOCCUS FAECALIS         Radiology Studies: ECHOCARDIOGRAM COMPLETE Result Date: 01/29/2024    ECHOCARDIOGRAM REPORT   Patient Name:   Sarah Harper Date of Exam: 01/29/2024 Medical Rec #:  [id number]       Height:       64.0 in Accession #:  [id number]      Weight:       126.4 lb Date of Birth:  Jan 17, 1941      BSA:          1.610 m Patient Age:    82 years        BP:           120/60 mmHg Patient Gender: F               HR:           59 bpm. Exam Location:  Inpatient Procedure: 2D Echo, Cardiac Doppler and Color Doppler (Both Spectral and Color            Flow Doppler were utilized during procedure). Indications:    R55 Syncope  History:        Patient has prior history of Echocardiogram examinations, most                 recent 11/09/2020.  Sonographer:    Andrena Bang Referring Phys: LAXMAN POKHREL IMPRESSIONS  1. Left ventricular ejection fraction, by estimation, is 70 to 75%. The left ventricle has hyperdynamic function. The left ventricle has no regional wall motion abnormalities. There is severe left ventricular hypertrophy of the inferior segment. Left ventricular diastolic parameters are consistent with Grade I diastolic dysfunction (impaired relaxation).  2. Right ventricular systolic function is normal. The right ventricular size is normal.  3. A small pericardial effusion is present. The pericardial effusion is circumferential. There is no evidence of cardiac tamponade.  4. The mitral valve is normal in structure. Mild mitral valve  regurgitation. No evidence of mitral stenosis.  5. Tricuspid valve regurgitation is mild to moderate.  6. The aortic valve is tricuspid. Aortic valve regurgitation is not visualized. No aortic stenosis is present.  7. The inferior vena cava is normal in size with greater than 50% respiratory variability, suggesting right atrial pressure of 3 mmHg. Comparison(s): No significant change from prior study. Prior images reviewed side by side. Prior ventricular function was more robust than reported. FINDINGS  Left Ventricle: Left ventricular ejection fraction, by estimation, is 70 to 75%. The left ventricle has hyperdynamic function. The left ventricle has no regional wall motion abnormalities. The left ventricular internal cavity size was normal in size. There is severe left ventricular hypertrophy of the inferior segment. Left ventricular diastolic parameters are consistent with Grade I diastolic dysfunction (impaired relaxation). Right Ventricle: The right ventricular size is normal. No increase in right ventricular wall thickness. Right ventricular systolic function is normal. Left Atrium: Left atrial size was normal in size. Right Atrium: Right atrial size was normal in size. Pericardium: A small pericardial effusion is present. The pericardial effusion is circumferential. There is no evidence of cardiac tamponade. Mitral Valve: The mitral valve is normal in structure. Mild mitral valve regurgitation. No evidence of mitral valve stenosis. Tricuspid Valve: The tricuspid valve is normal in structure. Tricuspid valve regurgitation is mild to moderate. No evidence of tricuspid stenosis. Aortic Valve: The aortic valve is tricuspid. Aortic valve regurgitation is not visualized. No aortic stenosis is present. Aortic valve mean gradient measures 4.0 mmHg. Aortic valve peak gradient measures 7.4 mmHg. Aortic valve area, by VTI measures 2.95 cm. Pulmonic Valve: The pulmonic valve was normal in structure. Pulmonic valve  regurgitation is mild to moderate. No evidence of pulmonic stenosis. Aorta: The aortic root and ascending aorta are structurally normal, with no evidence of dilitation. Pulmonary Artery: The pulmonary artery is of normal size.  Venous: The inferior vena cava is normal in size with greater than 50% respiratory variability, suggesting right atrial pressure of 3 mmHg. IAS/Shunts: The atrial septum is grossly normal.  LEFT VENTRICLE PLAX 2D LVIDd:         3.70 cm     Diastology LVIDs:         2.60 cm     LV e' medial:    6.31 cm/s LV PW:         1.60 cm     LV E/e' medial:  12.9 LV IVS:        0.90 cm     LV e' lateral:   6.20 cm/s LVOT diam:     1.90 cm     LV E/e' lateral: 13.1 LV SV:         91 LV SV Index:   57 LVOT Area:     2.84 cm  LV Volumes (MOD) LV vol d, MOD A2C: 55.6 ml LV vol d, MOD A4C: 77.2 ml LV vol s, MOD A2C: 15.3 ml LV vol s, MOD A4C: 19.1 ml LV SV MOD A2C:     40.3 ml LV SV MOD A4C:     77.2 ml LV SV MOD BP:      49.3 ml RIGHT VENTRICLE RV S prime:     10.80 cm/s TAPSE (M-mode): 2.3 cm LEFT ATRIUM           Index LA diam:      2.70 cm 1.68 cm/m LA Vol (A2C): 26.5 ml 16.46 ml/m LA Vol (A4C): 34.7 ml 21.56 ml/m  AORTIC VALVE AV Area (Vmax):    2.69 cm AV Area (Vmean):   2.49 cm AV Area (VTI):     2.95 cm AV Vmax:           136.00 cm/s AV Vmean:          89.100 cm/s AV VTI:            0.309 m AV Peak Grad:      7.4 mmHg AV Mean Grad:      4.0 mmHg LVOT Vmax:         129.00 cm/s LVOT Vmean:        78.400 cm/s LVOT VTI:          0.322 m LVOT/AV VTI ratio: 1.04  AORTA Ao Asc diam: 3.60 cm MITRAL VALVE MV Area (PHT): 2.60 cm    SHUNTS MV Decel Time: 292 msec    Systemic VTI:  0.32 m MV E velocity: 81.20 cm/s  Systemic Diam: 1.90 cm MV A velocity: 89.10 cm/s MV E/A ratio:  0.91 Sarah Larger MD Electronically signed by Sarah Larger MD Signature Date/Time: 01/29/2024/3:52:05 PM    Final    CT Head Wo Contrast Result Date: 01/28/2024 CLINICAL DATA:  Mental status change, unknown cause  EXAM: CT HEAD WITHOUT CONTRAST TECHNIQUE: Contiguous axial images were obtained from the base of the skull through the vertex without intravenous contrast. RADIATION DOSE REDUCTION: This exam was performed according to the departmental dose-optimization program which includes automated exposure control, adjustment of the mA and/or kV according to patient size and/or use of iterative reconstruction technique. COMPARISON:  CT head 11/15/2020 FINDINGS: Brain: Patchy and confluent areas of decreased attenuation are noted throughout the deep and periventricular white matter of the cerebral hemispheres bilaterally, compatible with chronic microvascular ischemic disease. No evidence of large-territorial acute infarction. No parenchymal hemorrhage. No mass lesion. No extra-axial collection. No mass effect or midline  shift. No hydrocephalus. Basilar cisterns are patent. Vascular: No hyperdense vessel. Atherosclerotic calcifications are present within the cavernous internal carotid arteries. Skull: No acute fracture or focal lesion. Sinuses/Orbits: Paranasal sinuses and mastoid air cells are clear. Bilateral lens replacement. Otherwise the orbits are unremarkable. Other: None. IMPRESSION: No acute intracranial abnormality. Electronically Signed   By: Sarah  Harper M.D.   On: 01/28/2024 15:51        Scheduled Meds:  amLODipine   2.5 mg Oral Daily   enoxaparin  (LOVENOX ) injection  40 mg Subcutaneous Q24H   feeding supplement  237 mL Oral TID BM   levothyroxine   75 mcg Oral QAC breakfast   metoprolol  tartrate  12.5 mg Oral BID   potassium chloride   40 mEq Oral Once   sodium chloride  flush  3 mL Intravenous Q12H   sodium chloride  flush  3 mL Intravenous Q12H   Continuous Infusions:  ampicillin (OMNIPEN) IV 1 g (01/30/24 1215)     LOS: 1 day      Sarah Dadds, MD [facility]   To contact the attending provider between 7A-7P or the covering provider during after hours 7P-7A, please log into  the web site [email or web address] and access using universal [place] password for that web site. If you do not have the password, please call the hospital operator.  01/30/2024, 1:36 PM

## 2024-01-31 ENCOUNTER — Inpatient Hospital Stay (HOSPITAL_COMMUNITY)

## 2024-01-31 ENCOUNTER — Ambulatory Visit

## 2024-01-31 DIAGNOSIS — G9341 Metabolic encephalopathy: Secondary | ICD-10-CM | POA: Diagnosis not present

## 2024-01-31 LAB — CBC
HCT: 39 % (ref 36.0–46.0)
Hemoglobin: 13.5 g/dL (ref 12.0–15.0)
MCH: 28.8 pg (ref 26.0–34.0)
MCHC: 34.6 g/dL (ref 30.0–36.0)
MCV: 83.3 fL (ref 80.0–100.0)
Platelets: 292 10*3/uL (ref 150–400)
RBC: 4.68 MIL/uL (ref 3.87–5.11)
RDW: 13.3 % (ref 11.5–15.5)
WBC: 9.2 10*3/uL (ref 4.0–10.5)
nRBC: 0 % (ref 0.0–0.2)

## 2024-01-31 LAB — BASIC METABOLIC PANEL WITH GFR
Anion gap: 12 (ref 5–15)
BUN: 9 mg/dL (ref 8–23)
CO2: 25 mmol/L (ref 22–32)
Calcium: 9.1 mg/dL (ref 8.9–10.3)
Chloride: 88 mmol/L — ABNORMAL LOW (ref 98–111)
Creatinine, Ser: 0.58 mg/dL (ref 0.44–1.00)
GFR, Estimated: >60 mL/min (ref >60)
Glucose, Bld: 115 mg/dL — ABNORMAL HIGH (ref 70–99)
Potassium: 3.5 mmol/L (ref 3.5–5.1)
Sodium: 125 mmol/L — ABNORMAL LOW (ref 135–145)

## 2024-01-31 LAB — MAGNESIUM: Magnesium: 1.9 mg/dL (ref 1.7–2.4)

## 2024-01-31 LAB — PHOSPHORUS: Phosphorus: 2.5 mg/dL (ref 2.5–4.6)

## 2024-01-31 MED ORDER — POTASSIUM CHLORIDE CRYS ER 20 MEQ PO TBCR
40.0000 meq | EXTENDED_RELEASE_TABLET | Freq: Four times a day (QID) | ORAL | Status: AC
  Administered 2024-01-31 (×2): 40 meq via ORAL
  Filled 2024-01-31 (×2): qty 2

## 2024-01-31 NOTE — Progress Notes (Signed)
 Physical Therapy Treatment Patient Details Name: Sarah Harper MRN: [id number] DOB: 01/07/1941 Today's Date: 01/31/2024   History of Present Illness Patient is an 83 y.o. female presented to the ED on 01/29/24 for evaluation for syncopal episode at home. Pt's spouse reports increased confusion in week PTA. Pt hypertensive in ED. PT admitted for encephalopathy suspected due to UTI. PMH significant for HTN, GERD, osteopenia, CVA, syncopal episodes, TIA, DDD, anxiety,  R hip fx s/p IMN 09/06/23.    PT Comments  Patient resting in bed and agreeable to mobilize with therapy and slightly more aware of situation but remains confused. Pt with improved initiation for bed mobility and min assist to move supine>sit. Transfers and gait more limited today as pt had increased posterior bias and increased difficulty initiating steps. Pt gait somewhat festinating and assist required to advance walker, shift anteriorly, and visual cue of therapist's foot to facilitate greater step length. Pt unable to ambulate into bathroom with IV pole safely and transitioned to amb to [facility] set by sink. Pt unable to void bladder and amb same distance to recliner for rest with family present until time for MRI. Will continue to progress pt as able during acute stay. Patient will benefit from continued inpatient follow up therapy, <3 hours/day.    If plan is discharge home, recommend the following: A little help with walking and/or transfers;A little help with bathing/dressing/bathroom;Assistance with cooking/housework;Direct supervision/assist for medications management;Assist for transportation;Help with stairs or ramp for entrance;Supervision due to cognitive status   Can travel by private vehicle     Yes  Equipment Recommendations  BSC/3in1    Recommendations for Other Services       Precautions / Restrictions Precautions Precautions: Fall Recall of Precautions/Restrictions: Impaired Restrictions Weight Bearing  Restrictions Per Provider Order: No     Mobility  Bed Mobility Overal bed mobility: Needs Assistance Bed Mobility: Supine to Sit     Supine to sit: HOB elevated, Used rails, Min assist     General bed mobility comments: Cues to initiate and sequence, min assist to raise trunk and scoot to edge.    Transfers Overall transfer level: Needs assistance Equipment used: Rolling walker (2 wheels) Transfers: Sit to/from Stand Sit to Stand: Min assist           General transfer comment: min assist with cues for hand placement and to steady due to posterior bias. cues for anterior trunk lean and weight shift.    Ambulation/Gait Ambulation/Gait assistance: Mod assist Gait Distance (Feet): 15 Feet (2x) Assistive device: Rolling walker (2 wheels) Gait Pattern/deviations: Step-through pattern, Decreased step length - right, Decreased step length - left, Decreased stride length, Shuffle, Trunk flexed Gait velocity: decr     General Gait Details: assist to steady due to posterior bias and low shuffled steps. somewhat festinating gait. pt with greater difficulty today and distance limited to amb short bout in room to [facility]. then abck to recliner.   Stairs             Wheelchair Mobility     Tilt Bed    Modified Rankin (Stroke Patients Only)       Balance Overall balance assessment: Needs assistance Sitting-balance support: Feet supported Sitting balance-Leahy Scale: Poor Sitting balance - Comments: CGA- min A   Standing balance support: Bilateral upper extremity supported, During functional activity Standing balance-Leahy Scale: Poor Standing balance comment: posterior bais upon standing  Communication Communication Communication: No apparent difficulties  Cognition Arousal: Alert Behavior During Therapy: Impulsive   PT - Cognitive impairments: Awareness, Initiation, Sequencing, Problem solving, Safety/Judgement                        PT - Cognition Comments: pt confused but slightly more aware today, reports going for a procedure. Following commands: Impaired Following commands impaired: Follows one step commands inconsistently    Cueing Cueing Techniques: Verbal cues  Exercises      General Comments        Pertinent Vitals/Pain Pain Assessment Faces Pain Scale: No hurt Breathing: normal Negative Vocalization: none Facial Expression: smiling or inexpressive Body Language: relaxed Consolability: no need to console PAINAD Score: 0 Pain Intervention(s): Monitored during session    Home Living                          Prior Function            PT Goals (current goals can now be found in the care plan section) Acute Rehab PT Goals Patient Stated Goal: get better and home PT Goal Formulation: With patient/family Time For Goal Achievement: 02/13/24 Potential to Achieve Goals: Good Progress towards PT goals: Progressing toward goals    Frequency    Min 2X/week      PT Plan      Co-evaluation              AM-PAC PT 6 Clicks Mobility   Outcome Measure  Help needed turning from your back to your side while in a flat bed without using bedrails?: A Little Help needed moving from lying on your back to sitting on the side of a flat bed without using bedrails?: A Lot Help needed moving to and from a bed to a chair (including a wheelchair)?: A Little Help needed standing up from a chair using your arms (e.g., wheelchair or bedside chair)?: A Little Help needed to walk in hospital room?: A Little Help needed climbing 3-5 steps with a railing? : Total 6 Click Score: 15    End of Session Equipment Utilized During Treatment: Gait belt Activity Tolerance: Patient tolerated treatment well Patient left: with call bell/phone within reach;in bed;with bed alarm set;with family/visitor present Nurse Communication: Mobility status PT Visit Diagnosis: Other abnormalities  of gait and mobility (R26.89);Muscle weakness (generalized) (M62.81);History of falling (Z91.81);Difficulty in walking, not elsewhere classified (R26.2);Other symptoms and signs involving the nervous system (R29.898)     Time: 7829-5621 PT Time Calculation (min) (ACUTE ONLY): 35 min  Charges:    $Gait Training: 8-22 mins $Therapeutic Activity: 8-22 mins PT General Charges $$ ACUTE PT VISIT: 1 Visit                     Tish Forge, DPT Acute Rehabilitation Services Office [phone]  01/31/24 7:01 PM

## 2024-01-31 NOTE — Plan of Care (Signed)
  Problem: Clinical Measurements: Goal: Ability to maintain clinical measurements within normal limits will improve Outcome: Progressing   Problem: Activity: Goal: Risk for activity intolerance will decrease Outcome: Progressing   Problem: Safety: Goal: Ability to remain free from injury will improve Outcome: Progressing   Problem: Skin Integrity: Goal: Risk for impaired skin integrity will decrease Outcome: Progressing

## 2024-01-31 NOTE — TOC Initial Note (Signed)
 Transition of Care [facility]) - Initial/Assessment Note    Patient Details  Name: Sarah Harper MRN: [id number] Date of Birth: 11/05/1940  Transition of Care [facility]) CM/SW Contact:    Jannice Mends, LCSW Phone Number: 01/31/2024, 4:15 PM  Clinical Narrative:                 Family not at bedside.  CSW left voicemail for patient's spouse.   Expected Discharge Plan: Skilled Nursing Facility Barriers to Discharge: Continued Medical Work up, English as a second language teacher, SNF Pending bed offer   Patient Goals and CMS Choice            Expected Discharge Plan and Services In-house Referral: Clinical Social Work     Living arrangements for the past 2 months: Single Family Home                                      Prior Living Arrangements/Services Living arrangements for the past 2 months: Single Family Home Lives with:: Spouse Patient language and need for interpreter reviewed:: Yes Do you feel safe going back to the place where you live?: Yes      Need for Family Participation in Patient Care: Yes (Comment) Care giver support system in place?: Yes (comment)   Criminal Activity/Legal Involvement Pertinent to Current Situation/Hospitalization: No - Comment as needed  Activities of Daily Living   ADL Screening (condition at time of admission) Independently performs ADLs?: No Does the patient have a NEW difficulty with bathing/dressing/toileting/self-feeding that is expected to last >3 days?: Yes (Initiates electronic notice to provider for possible OT consult) Does the patient have a NEW difficulty with getting in/out of bed, walking, or climbing stairs that is expected to last >3 days?: Yes (Initiates electronic notice to provider for possible PT consult) Does the patient have a NEW difficulty with communication that is expected to last >3 days?: No Is the patient deaf or have difficulty hearing?: No Does the patient have difficulty seeing, even when wearing  glasses/contacts?: No Does the patient have difficulty concentrating, remembering, or making decisions?: Yes  Permission Sought/Granted Permission sought to share information with : Facility Medical sales representative, Family Supports Permission granted to share information with : Yes, Verbal Permission Granted  Share Information with NAME: Erland  Permission granted to share info w AGENCY: SNFs  Permission granted to share info w Relationship: Spouse  Permission granted to share info w Contact Information: [phone]  Emotional Assessment Appearance:: Appears stated age Attitude/Demeanor/Rapport: Unable to Assess Affect (typically observed): Unable to Assess Orientation: : Oriented to Self Alcohol / Substance Use: Not Applicable Psych Involvement: No (comment)  Admission diagnosis:  Hyponatremia [E87.1] Acute urinary tract infection [N39.0] Acute metabolic encephalopathy [G93.41] Metabolic encephalopathy [G93.41] Patient Active Problem List   Diagnosis Date Noted   Vitamin D  deficiency 01/29/2024   Metabolic encephalopathy 01/29/2024   Skin lesion of right leg 10/18/2023   Hyperosmolar hyponatremia 10/18/2023   Blood loss anemia 09/13/2023   Closed right hip fracture (HCC) 09/05/2023   Abnormal urinalysis 09/02/2023   Acute cystitis 08/21/2023   Current use of proton pump inhibitor 03/13/2023   Pain in joint, lower leg 01/30/2023   Senile purpura (HCC) 12/26/2022   Skin tear of right lower leg without complication 12/26/2022   Dysuria 10/21/2021   Elevated alkaline phosphatase level 03/11/2021   Palpitations 11/10/2020   Migraine    Acute metabolic encephalopathy    Essential  hypertension    H/O: CVA (cerebrovascular accident) 11/08/2020   Headache 09/26/2019   Fall at home 06/02/2019   Colon cancer screening 02/05/2018   Degenerative joint disease (DJD) of lumbar spine 05/02/2017   Internal hemorrhoids 05/02/2017   Generalized osteoarthritis of hand 12/11/2016   GAD  (generalized anxiety disorder) 12/11/2016   Estrogen deficiency 05/11/2016   Encounter for screening mammogram for breast cancer 05/11/2016   OSA (obstructive sleep apnea) 03/06/2016   Fatigue 01/25/2016   Left knee pain 10/18/2015   Snoring 10/18/2015   Routine general medical examination at a health care facility 02/28/2015   Encounter for Medicare annual wellness exam 02/06/2013   Hyperlipidemia 08/27/2012   Irregular heart beat 08/05/2012   Encounter for gynecological examination 12/20/2010   Osteopenia 12/17/2009   BACK PAIN, LUMBAR 07/02/2009   IRRITABLE BOWEL SYNDROME 04/02/2009   Asymptomatic postmenopausal status 12/10/2008   Hypothyroidism 12/05/2007   Meniere's disease 12/05/2007   GERD 12/05/2007   PCP:  Clemens Curt, MD Pharmacy:   [facility] [phone] - [place], [place] - [address] [place] [address] Phone: [phone] Fax: [phone]     Social Drivers of Health (SDOH) Social History: SDOH Screenings   Food Insecurity: No Food Insecurity (01/29/2024)  Housing: Low Risk  (01/29/2024)  Transportation Needs: No Transportation Needs (01/29/2024)  Utilities: Not At Risk (01/29/2024)  Alcohol Screen: Low Risk  (12/26/2023)  Depression (PHQ2-9): High Risk (01/21/2024)  Financial Resource Strain: Low Risk  (12/26/2023)  Physical Activity: Insufficiently Active (12/26/2023)  Social Connections: Moderately Integrated (01/29/2024)  Stress: No Stress Concern Present (12/26/2023)  Tobacco Use: Low Risk  (01/29/2024)  Health Literacy: Adequate Health Literacy (12/26/2023)   SDOH Interventions:     Readmission Risk Interventions     No data to display

## 2024-01-31 NOTE — Progress Notes (Signed)
 PROGRESS NOTE    Sarah Harper  ZOX:[id number] DOB: [birth year] DOA: 01/28/2024 PCP: Clemens Curt, MD    Chief Complaint  Patient presents with   Loss of Consciousness    Brief Narrative:    Sarah Harper is a 83 y.o. female with past medical history significant for essential hypertension, generalized anxiety disorder, hypothyroidism, vitamin B12 and vitamin D  deficiency presented to the emergency department for evaluation for syncopal episode at home. Patient's husband reported that she has been seemed confused over the course of last 1 week corresponds with her taking a new blood pressure pill.  She was also recently started on Cymbalta .  No other new medications.  She has been acting differently, especially asking strange questions and going to bed earlier and staying in bed more often.  EMS was called.  In the ED patient was hypertensive.  Initial labs showed sodium of 121.    Urine sodium 81. Urinalysis with red cells.  CBC was unremarkable.  Troponin within normal range.  EKG showed normal sinus rhythm.  CT head without any acute findings.  Chest x-ray with low lung volumes.  In the ED patient was confused and restless and received Haldol.  Patient was then considered for admission to the hospital for further evaluation and treatment. - Patient was admitted for syncope, encephalopathic, workup significant for UTI, he remained significantly confused and altered, far from her baseline..     Assessment & Plan:   Principal Problem:   Acute metabolic encephalopathy Active Problems:   Hypothyroidism   Hyperosmolar hyponatremia   GAD (generalized anxiety disorder)   Essential hypertension   Acute cystitis   Vitamin D  deficiency   Metabolic encephalopathy   Acute metabolic encephalopathy likely secondary to UTI and hyponatremia. -Patient significantly altered, confused this morning, appears more awake but oriented x 1 only . - CT head with no acute findings . - This can be  attributed to UTI, as well hyponatremia . - Possible hospital delirium as well. - She remains confused this morning, so will obtain MRI brain, - Continue to treat UTI and hyponatremia.    Syncopal  - Likely in the setting of severe electrolyte derangements, hyponatremia and UTI . - 2D echo with EF 75%, continue to monitor on telemetry .   UTI, Enterococcus On IV Rocephin, urine culture growing Enterococcus, antibiotic changed to IV ampicillin   Severe hypokalemia  - Replaced .  Hypomagnesemia  - Replaced   Hypophosphatemia -  replaced     Hypoosmolar hyponatremia history of previous hypoosmolar hyponatremia and SIADH. -Euvolemic . - Continue with fluid restrictions . - Will start on Ensure, , she was encouraged to increase her p.o. intake . - Cymbalta  . - Will consider salt tabs, oral urea if remains resistant to fluid restrictions .Sarah Harper   Hypothyroidism Continue levothyroxine .  TSH borderline high at 6.5.   Generalized anxiety disorder Was recently started on Cymbalta .  Will hold off for now in the context of hyponatremia.   Essential hypertension Continue amlodipine  metoprolol  and hydralazine .  Continue to monitor blood pressure.  Overall labile, but couple significant elevated reading, but overall well-controlled, on as needed hydralazine    History of hip fracture status post nailing repair in January 2025 Continue physical therapy.  Fall precautions.   GERD Discontinue Protonix  for now in the context of hyponatremia.     DVT prophylaxis: Lovenox  Code Status: Full Family Communication: disussed with husband at bedside 6/11 Disposition:   Status is: Inpatient    Consultants:  None   Subjective:  She remains confused this morning, no significant events overnight as discussed with staff.   Objective: Vitals:   01/31/24 0800 01/31/24 0809 01/31/24 0900 01/31/24 1000  BP:      Pulse:      Resp: 16 18 16 19   Temp:      TempSrc:      SpO2:         Intake/Output Summary (Last 24 hours) at 01/31/2024 1439 Last data filed at 01/31/2024 0830 Gross per 24 hour  Intake 343 ml  Output 250 ml  Net 93 ml   There were no vitals filed for this visit.  Examination:    Awake Alert, Oriented X 1, confused, frail, chronically ill-appearing Symmetrical Chest wall movement, Good air movement bilaterally, CTAB RRR,No Gallops,Rubs or new Murmurs, No Parasternal Heave +ve B.Sounds, Abd Soft, No tenderness, No rebound - guarding or rigidity. No Cyanosis, Clubbing or edema, No new Rash or bruise      Data Reviewed: I have personally reviewed following labs and imaging studies  CBC: Recent Labs  Lab 01/28/24 1225 01/29/24 1445 01/30/24 0603 01/31/24 0741  WBC 7.6 10.0 11.1* 9.2  HGB 12.3 13.0 13.7 13.5  HCT 36.2 37.5 39.5 39.0  MCV 86.2 83.7 83.0 83.3  PLT 229 270 277 292    Basic Metabolic Panel: Recent Labs  Lab 01/29/24 1355 01/29/24 1732 01/30/24 0603 01/30/24 1458 01/31/24 0741  NA 126* 125* 123* 124* 125*  K 3.7 2.7* 3.8 4.3 3.5  CL 90* 89* 88* 90* 88*  CO2 23 26 24 23 25   GLUCOSE 88 114* 142* 133* 115*  BUN 8 7* 7* 12 9  CREATININE 0.53 0.54 0.57 0.60 0.58  CALCIUM  9.0 9.0 9.4 9.5 9.1  MG  --   --  1.5*  --  1.9  PHOS  --   --  2.1*  --  2.5    GFR: Estimated Creatinine Clearance: 46.8 mL/min (by C-G formula based on SCr of 0.58 mg/dL).  Liver Function Tests: Recent Labs  Lab 01/28/24 1225  AST 30  ALT 16  ALKPHOS 76  BILITOT 1.5*  PROT 6.4*  ALBUMIN 3.7    CBG: Recent Labs  Lab 01/28/24 1953  GLUCAP 102*     Recent Results (from the past 240 hours)  Urine Culture     Status: Abnormal   Collection Time: 01/28/24  8:22 PM   Specimen: Urine, Clean Catch  Result Value Ref Range Status   Specimen Description URINE, CLEAN CATCH  Final   Special Requests   Final    NONE Performed at [facility], [address], [place], [place] [address]    Culture >=100,000 COLONIES/mL ENTEROCOCCUS  FAECALIS (A)  Final   Report Status 01/30/2024 FINAL  Final   Organism ID, Bacteria ENTEROCOCCUS FAECALIS (A)  Final      Susceptibility   Enterococcus faecalis - MIC*    AMPICILLIN <=2 SENSITIVE Sensitive     NITROFURANTOIN <=16 SENSITIVE Sensitive     VANCOMYCIN 1 SENSITIVE Sensitive     * >=100,000 COLONIES/mL ENTEROCOCCUS FAECALIS         Radiology Studies: No results found.       Scheduled Meds:  amLODipine   2.5 mg Oral Daily   enoxaparin  (LOVENOX ) injection  40 mg Subcutaneous Q24H   feeding supplement  237 mL Oral TID BM   levothyroxine   75 mcg Oral QAC breakfast   metoprolol  tartrate  12.5 mg Oral BID  potassium chloride   40 mEq Oral Once   QUEtiapine  25 mg Oral Q24H   sodium chloride  flush  3 mL Intravenous Q12H   sodium chloride  flush  3 mL Intravenous Q12H   Continuous Infusions:  ampicillin (OMNIPEN) IV 1 g (01/31/24 1302)     LOS: 2 days      Seena Dadds, MD [facility]   To contact the attending provider between 7A-7P or the covering provider during after hours 7P-7A, please log into the web site [email or web address] and access using universal [place] password for that web site. If you do not have the password, please call the hospital operator.  01/31/2024, 2:39 PM

## 2024-01-31 NOTE — Plan of Care (Signed)

## 2024-02-01 DIAGNOSIS — G9341 Metabolic encephalopathy: Secondary | ICD-10-CM | POA: Diagnosis not present

## 2024-02-01 LAB — BASIC METABOLIC PANEL WITH GFR
Anion gap: 11 (ref 5–15)
BUN: 13 mg/dL (ref 8–23)
CO2: 23 mmol/L (ref 22–32)
Calcium: 8.8 mg/dL — ABNORMAL LOW (ref 8.9–10.3)
Chloride: 91 mmol/L — ABNORMAL LOW (ref 98–111)
Creatinine, Ser: 0.54 mg/dL (ref 0.44–1.00)
GFR, Estimated: >60 mL/min (ref >60)
Glucose, Bld: 98 mg/dL (ref 70–99)
Potassium: 4.4 mmol/L (ref 3.5–5.1)
Sodium: 125 mmol/L — ABNORMAL LOW (ref 135–145)

## 2024-02-01 LAB — CBC
HCT: 38.3 % (ref 36.0–46.0)
Hemoglobin: 13 g/dL (ref 12.0–15.0)
MCH: 28.8 pg (ref 26.0–34.0)
MCHC: 33.9 g/dL (ref 30.0–36.0)
MCV: 84.7 fL (ref 80.0–100.0)
Platelets: 291 10*3/uL (ref 150–400)
RBC: 4.52 MIL/uL (ref 3.87–5.11)
RDW: 13.6 % (ref 11.5–15.5)
WBC: 7.4 10*3/uL (ref 4.0–10.5)
nRBC: 0 % (ref 0.0–0.2)

## 2024-02-01 LAB — T4, FREE: Free T4: 1.51 ng/dL — ABNORMAL HIGH (ref 0.61–1.12)

## 2024-02-01 LAB — PHOSPHORUS: Phosphorus: 2.4 mg/dL — ABNORMAL LOW (ref 2.5–4.6)

## 2024-02-01 LAB — MAGNESIUM: Magnesium: 1.8 mg/dL (ref 1.7–2.4)

## 2024-02-01 MED ORDER — SODIUM CHLORIDE 1 G PO TABS
1.0000 g | ORAL_TABLET | Freq: Three times a day (TID) | ORAL | Status: DC
  Administered 2024-02-01 – 2024-02-05 (×8): 1 g via ORAL
  Filled 2024-02-01 (×11): qty 1

## 2024-02-01 MED ORDER — K PHOS MONO-SOD PHOS DI & MONO 155-852-130 MG PO TABS
500.0000 mg | ORAL_TABLET | Freq: Two times a day (BID) | ORAL | Status: AC
  Administered 2024-02-01 (×2): 500 mg via ORAL
  Filled 2024-02-01 (×2): qty 2

## 2024-02-01 NOTE — TOC Transition Note (Signed)
 Transition of Care [facility]) - Discharge Note   Patient Details  Name: Sarah Harper MRN: [id number] Date of Birth: 1940-12-22  [facility]) CM/SW Contact:  Eusebio High, RN Phone Number: 02/01/2024, 3:59 PM   Clinical Narrative:     BSC ordered from [employer] and will be delivered bedside prior to DC       Barriers to Discharge: Continued Medical Work up   Patient Goals and CMS Choice Patient states their goals for this hospitalization and ongoing recovery are:: Return home CMS Medicare.gov Compare Post Acute Care list provided to:: Patient Represenative (must comment) Choice offered to / list presented to : Spouse [place] ownership interest in [facility].provided to:: Spouse    Discharge Placement                       Discharge Plan and Services Additional resources added to the After Visit Summary for   In-house Referral: Clinical Social Work Discharge Planning Services: CM Consult Post Acute Care Choice: Home Health, Durable Medical Equipment                               Social Drivers of Health (SDOH) Interventions SDOH Screenings   Food Insecurity: No Food Insecurity (01/29/2024)  Housing: Low Risk  (01/29/2024)  Transportation Needs: No Transportation Needs (01/29/2024)  Utilities: Not At Risk (01/29/2024)  Alcohol Screen: Low Risk  (12/26/2023)  Depression (PHQ2-9): High Risk (01/21/2024)  Financial Resource Strain: Low Risk  (12/26/2023)  Physical Activity: Insufficiently Active (12/26/2023)  Social Connections: Moderately Integrated (01/29/2024)  Stress: No Stress Concern Present (12/26/2023)  Tobacco Use: Low Risk  (01/29/2024)  Health Literacy: Adequate Health Literacy (12/26/2023)     Readmission Risk Interventions     No data to display

## 2024-02-01 NOTE — Patient Instructions (Signed)

## 2024-02-01 NOTE — Progress Notes (Signed)
 PROGRESS NOTE    Sarah Harper  ZOX:[id number] DOB: October 18, 1940 DOA: 01/28/2024 PCP: Clemens Curt, MD    Chief Complaint  Patient presents with   Loss of Consciousness    Brief Narrative:    Sarah Harper is a 83 y.o. female with past medical history significant for essential hypertension, generalized anxiety disorder, hypothyroidism, vitamin B12 and vitamin D  deficiency presented to the emergency department for evaluation for syncopal episode at home. Patient's husband reported that she has been seemed confused over the course of last 1 week corresponds with her taking a new blood pressure pill.  She was also recently started on Cymbalta .  No other new medications.  She has been acting differently, especially asking strange questions and going to bed earlier and staying in bed more often.  EMS was called.  In the ED patient was hypertensive.  Initial labs showed sodium of 121.    Urine sodium 81. Urinalysis with red cells.  CBC was unremarkable.  Troponin within normal range.  EKG showed normal sinus rhythm.  CT head without any acute findings.  Chest x-ray with low lung volumes.  In the ED patient was confused and restless and received Haldol .  Patient was then considered for admission to the hospital for further evaluation and treatment. - Patient was admitted for syncope, encephalopathic, workup significant for UTI, he remained significantly confused and altered, far from her baseline.   Assessment & Plan:   Principal Problem:   Acute metabolic encephalopathy Active Problems:   Hypothyroidism   Hyperosmolar hyponatremia   GAD (generalized anxiety disorder)   Essential hypertension   Acute cystitis   Vitamin D  deficiency   Metabolic encephalopathy   Acute metabolic encephalopathy likely secondary to UTI and hyponatremia. Acute toxic encephalopathy secondary to Cymbalta  -Plan, she is appropriate, awake alert x 3, significantly confused and altered on admission, she is  gradually improving but not back at baseline yet . - CT head with no acute findings . - This can be attributed to UTI, as well hyponatremia .  Likely to Cymbalta  -Need to hold Cymbalta  and avoid in the future -MRI brain with no acute findings   Syncopal  - Likely in the setting of severe electrolyte derangements, hyponatremia and UTI . - 2D echo with EF 75%, continue to monitor on telemetry .   UTI, Enterococcus rine culture growing Enterococcus, the Rocephin  changed to IV ampicillin    Severe hypokalemia  - Replaced .  Hypomagnesemia  - Replaced   Hypophosphatemia -  replaced     Hypoosmolar hyponatremia history of previous hypoosmolar hyponatremia and SIADH. -Euvolemic . - Continue with fluid restrictions . - Will start on Ensure, , she was encouraged to increase her p.o. intake . - Avoid Cymbalta  - Will start on salt tabs   Hypothyroidism Continue levothyroxine .  TSH borderline high at 6.5.   Generalized anxiety disorder Was recently started on Cymbalta .  Will hold off for now in the context of hyponatremia and encephalopathy.   Essential hypertension Continue amlodipine  metoprolol  and hydralazine .  Continue to monitor blood pressure.  Overall labile, but couple significant elevated reading, but overall well-controlled, on as needed hydralazine    History of hip fracture status post nailing repair in January 2025 Continue physical therapy.  Fall precautions.   GERD Discontinue Protonix  for now in the context of hyponatremia.     DVT prophylaxis: Lovenox  Code Status: Full Family Communication: Cussed with husband at bedside 6/1 11, none at bedside today Disposition:   Status is:  Inpatient    Consultants:  None   Subjective: Had a good night sleep, more awake and appropriate today  Objective: Vitals:   01/31/24 1620 01/31/24 2104 02/01/24 0400 02/01/24 0758  BP: 127/75   (!) 150/75  Pulse: 86 90 (!) 58 78  Resp:  16  15  Temp: 97.8 F (36.6 C) 98.5  F (36.9 C)  98.3 F (36.8 C)  TempSrc: Oral Oral  Oral  SpO2:   98%     Intake/Output Summary (Last 24 hours) at 02/01/2024 1248 Last data filed at 02/01/2024 0900 Gross per 24 hour  Intake 360 ml  Output 1350 ml  Net -990 ml   There were no vitals filed for this visit.  Examination:  Awake Alert, oriented x 1, but she knows it is 2025 does not know the month, she thinks she is in the hospital but not sure, but she is more conversant and appropriate today. Symmetrical Chest wall movement, Good air movement bilaterally, CTAB RRR,No Gallops,Rubs or new Murmurs, No Parasternal Heave +ve B.Sounds, Abd Soft, No tenderness, No rebound - guarding or rigidity. No Cyanosis, Clubbing or edema, No new Rash or bruise      Data Reviewed: I have personally reviewed following labs and imaging studies  CBC: Recent Labs  Lab 01/28/24 1225 01/29/24 1445 01/30/24 0603 01/31/24 0741 02/01/24 0703  WBC 7.6 10.0 11.1* 9.2 7.4  HGB 12.3 13.0 13.7 13.5 13.0  HCT 36.2 37.5 39.5 39.0 38.3  MCV 86.2 83.7 83.0 83.3 84.7  PLT 229 270 277 292 291    Basic Metabolic Panel: Recent Labs  Lab 01/29/24 1732 01/30/24 0603 01/30/24 1458 01/31/24 0741 02/01/24 0703  NA 125* 123* 124* 125* 125*  K 2.7* 3.8 4.3 3.5 4.4  CL 89* 88* 90* 88* 91*  CO2 26 24 23 25 23   GLUCOSE 114* 142* 133* 115* 98  BUN 7* 7* 12 9 13   CREATININE 0.54 0.57 0.60 0.58 0.54  CALCIUM  9.0 9.4 9.5 9.1 8.8*  MG  --  1.5*  --  1.9 1.8  PHOS  --  2.1*  --  2.5 2.4*    GFR: Estimated Creatinine Clearance: 46.8 mL/min (by C-G formula based on SCr of 0.54 mg/dL).  Liver Function Tests: Recent Labs  Lab 01/28/24 1225  AST 30  ALT 16  ALKPHOS 76  BILITOT 1.5*  PROT 6.4*  ALBUMIN 3.7    CBG: Recent Labs  Lab 01/28/24 1953  GLUCAP 102*     Recent Results (from the past 240 hours)  Urine Culture     Status: Abnormal   Collection Time: 01/28/24  8:22 PM   Specimen: Urine, Clean Catch  Result Value Ref Range  Status   Specimen Description URINE, CLEAN CATCH  Final   Special Requests   Final    NONE Performed at [facility], [address], [place], [place] [address]    Culture >=100,000 COLONIES/mL ENTEROCOCCUS FAECALIS (A)  Final   Report Status 01/30/2024 FINAL  Final   Organism ID, Bacteria ENTEROCOCCUS FAECALIS (A)  Final      Susceptibility   Enterococcus faecalis - MIC*    AMPICILLIN  <=2 SENSITIVE Sensitive     NITROFURANTOIN <=16 SENSITIVE Sensitive     VANCOMYCIN 1 SENSITIVE Sensitive     * >=100,000 COLONIES/mL ENTEROCOCCUS FAECALIS         Radiology Studies: MR BRAIN WO CONTRAST Result Date: 01/31/2024 EXAM DESCRIPTION: MR BRAIN WO CONTRAST CLINICAL HISTORY: AMS COMPARISON: None available  TECHNIQUE: Non contrast MRI of the brain is performed according to our usual protocol including multiplanar multi sequence technique. FINDINGS: No acute intracranial hemorrhage, mass, edema, or hydrocephalus. Mild cortical atrophy. No encephalomalacia. Mild to moderate white matter disease suggesting chronic small vessel ischemic change. The vascular flow voids are unremarkable. No significant sinus disease. IMPRESSION: Age-related change without acute intracranial pathology. Electronically signed by: Basilio Both MD 01/31/2024 08:41 PM EDT RP Workstation: [id number]        Scheduled Meds:  amLODipine   2.5 mg Oral Daily   enoxaparin  (LOVENOX ) injection  40 mg Subcutaneous Q24H   feeding supplement  237 mL Oral TID BM   levothyroxine   75 mcg Oral QAC breakfast   metoprolol  tartrate  12.5 mg Oral BID   QUEtiapine   25 mg Oral Q24H   sodium chloride  flush  3 mL Intravenous Q12H   sodium chloride  flush  3 mL Intravenous Q12H   Continuous Infusions:  ampicillin  (OMNIPEN) IV 1 g (02/01/24 0639)     LOS: 3 days      Seena Dadds, MD [facility]   To contact the attending provider between 7A-7P or the covering provider during after hours 7P-7A, please log into  the web site [email or web address] and access using universal [place] password for that web site. If you do not have the password, please call the hospital operator.  02/01/2024, 12:48 PM

## 2024-02-01 NOTE — Progress Notes (Signed)
 Occupational Therapy Treatment Patient Details Name: Sarah Harper MRN: [id number] DOB: 1940/10/02 Today's Date: 02/01/2024   History of present illness Patient is an 83 y.o. female presented to the ED on 01/29/24 for evaluation for syncopal episode at home. Pt's spouse reports increased confusion in week PTA. Pt hypertensive in ED. PT admitted for encephalopathy suspected due to UTI. PMH significant for HTN, GERD, osteopenia, CVA, syncopal episodes, TIA, DDD, anxiety,  R hip fx s/p IMN 09/06/23.   OT comments  Pt up in recliner upon therapy arrival with husband visiting in room. Pt participated in BUE and BLE strengthening exercises in order to increase functional performance during mobility and transfers. Pt able to follow all direction and demonstrate understanding of visual demonstration and verbal education while completing all exercises. Plan to discharge home with husband assisting. Recommend follow up HHOT services when at home.       If plan is discharge home, recommend the following:  A little help with walking and/or transfers;A little help with bathing/dressing/bathroom;Assist for transportation;Assistance with cooking/housework;Help with stairs or ramp for entrance   Equipment Recommendations  None recommended by OT       Precautions / Restrictions Precautions Precautions: Fall Recall of Precautions/Restrictions: Impaired Restrictions Weight Bearing Restrictions Per Provider Order: No              ADL either performed or assessed with clinical judgement     Communication Communication Communication: No apparent difficulties   Cognition Arousal: Alert Behavior During Therapy: WFL for tasks assessed/performed Cognition: Cognition impaired   Orientation impairments: Time, Situation Awareness: Intellectual awareness impaired, Online awareness impaired Memory impairment (select all impairments): Working memory Attention impairment (select first level of  impairment): Selective attention Executive functioning impairment (select all impairments): Reasoning, Problem solving      Following commands: Impaired Following commands impaired: Follows one step commands inconsistently      Cueing   Cueing Techniques: Verbal cues, Visual cues  Exercises General Exercises - Upper Extremity Shoulder ADduction: Strengthening, Both, 10 reps, Seated, Theraband Theraband Level (Shoulder Adduction): Level 1 (Yellow) Shoulder Horizontal ABduction: Both, 10 reps, Strengthening, Seated, Theraband Theraband Level (Shoulder Horizontal Abduction): Level 1 (Yellow) Shoulder Horizontal ADduction: Strengthening, Both, 10 reps, Theraband, Seated Theraband Level (Shoulder Horizontal Adduction): Level 1 (Yellow) General Exercises - Lower Extremity Hip ABduction/ADduction: Strengthening, Both, 10 reps, Seated, Other (comment) (yellow t-band loop) Shoulder Exercises Shoulder External Rotation: Strengthening, Both, 10 reps, Seated, Theraband Theraband Level (Shoulder External Rotation): Level 1 (Yellow) Other Exercises Other Exercises: seated, resistive knee flexion/extension, yellow t-band, 10X, 1 set            Pertinent Vitals/ Pain       Pain Assessment Pain Assessment: 0-10 Pain Score: 0-No pain         Frequency  Min 2X/week        Progress Toward Goals  OT Goals(current goals can now be found in the care plan section)  Progress towards OT goals: Progressing toward goals            AM-PAC OT 6 Clicks Daily Activity     Outcome Measure   Help from another person eating meals?: A Little Help from another person taking care of personal grooming?: A Little Help from another person toileting, which includes using toliet, bedpan, or urinal?: A Lot Help from another person bathing (including washing, rinsing, drying)?: A Lot Help from another person to put on and taking off regular upper body clothing?: A Little Help from another person to  put on and taking off regular lower body clothing?: A Lot 6 Click Score: 15    End of Session    OT Visit Diagnosis: Unsteadiness on feet (R26.81);Other abnormalities of gait and mobility (R26.89);Repeated falls (R29.6);Muscle weakness (generalized) (M62.81);History of falling (Z91.81)   Activity Tolerance Patient tolerated treatment well   Patient Left in chair;with call bell/phone within reach;with family/visitor present           Time: 1610-9604 OT Time Calculation (min): 25 min  Charges: OT General Charges $OT Visit: 1 Visit OT Treatments $Therapeutic Exercise: 23-37 mins  Carollee Circle, OTR/L,CBIS  Supplemental OT - [facility] and [facility] Secure Chat Preferred    [name], Ocie Belt 02/01/2024, 4:30 PM

## 2024-02-01 NOTE — Care Management Important Message (Signed)
 Important Message  Patient Details  Name: Sarah Harper MRN: [id number] Date of Birth: 1941-06-23   Important Message Given:  Yes - Medicare IM     Wynonia Hedges 02/01/2024, 3:30 PM

## 2024-02-01 NOTE — TOC Progression Note (Signed)
 Transition of Care [facility]) - Progression Note    Patient Details  Name: Sarah Harper MRN: [id number] Date of Birth: 09-24-1940  [facility] of [facility] CM/SW Contact  Jannice Mends, LCSW Phone Number: 02/01/2024, 9:14 AM  Clinical Narrative:    Patient's spouse returned call and declined SNF placement. He reported he is comfortable taking patient home with Home Health services and using [facility] again. Will update RNCM. Possible need for BSC.    Expected Discharge Plan: Home w Home Health Services Barriers to Discharge: Continued Medical Work up  Expected Discharge Plan and Services In-house Referral: Clinical Social Work Discharge Planning Services: CM Consult Post Acute Care Choice: Home Health, Durable Medical Equipment Living arrangements for the past 2 months: Single Family Home                                       Social Determinants of Health (SDOH) Interventions SDOH Screenings   Food Insecurity: No Food Insecurity (01/29/2024)  Housing: Low Risk  (01/29/2024)  Transportation Needs: No Transportation Needs (01/29/2024)  Utilities: Not At Risk (01/29/2024)  Alcohol Screen: Low Risk  (12/26/2023)  Depression (PHQ2-9): High Risk (01/21/2024)  Financial Resource Strain: Low Risk  (12/26/2023)  Physical Activity: Insufficiently Active (12/26/2023)  Social Connections: Moderately Integrated (01/29/2024)  Stress: No Stress Concern Present (12/26/2023)  Tobacco Use: Low Risk  (01/29/2024)  Health Literacy: Adequate Health Literacy (12/26/2023)    Readmission Risk Interventions     No data to display

## 2024-02-01 NOTE — Plan of Care (Signed)
  Problem: Clinical Measurements: Goal: Respiratory complications will improve Outcome: Progressing   Problem: Nutrition: Goal: Adequate nutrition will be maintained Outcome: Progressing   Problem: Elimination: Goal: Will not experience complications related to urinary retention Outcome: Progressing   Problem: Pain Managment: Goal: General experience of comfort will improve and/or be controlled Outcome: Progressing   Problem: Safety: Goal: Ability to remain free from injury will improve Outcome: Progressing   Problem: Skin Integrity: Goal: Risk for impaired skin integrity will decrease Outcome: Progressing   Problem: Education: Goal: Knowledge of General Education information will improve Description: Including pain rating scale, medication(s)/side effects and non-pharmacologic comfort measures Outcome: Not Progressing   Problem: Health Behavior/Discharge Planning: Goal: Ability to manage health-related needs will improve Outcome: Not Progressing   Problem: Activity: Goal: Risk for activity intolerance will decrease Outcome: Not Progressing

## 2024-02-02 ENCOUNTER — Inpatient Hospital Stay (HOSPITAL_COMMUNITY)

## 2024-02-02 DIAGNOSIS — R55 Syncope and collapse: Secondary | ICD-10-CM

## 2024-02-02 DIAGNOSIS — R569 Unspecified convulsions: Secondary | ICD-10-CM | POA: Diagnosis not present

## 2024-02-02 DIAGNOSIS — R4182 Altered mental status, unspecified: Secondary | ICD-10-CM

## 2024-02-02 DIAGNOSIS — G9341 Metabolic encephalopathy: Secondary | ICD-10-CM | POA: Diagnosis not present

## 2024-02-02 LAB — GLUCOSE, CAPILLARY: Glucose-Capillary: 145 mg/dL — ABNORMAL HIGH (ref 70–99)

## 2024-02-02 LAB — BASIC METABOLIC PANEL WITH GFR
Anion gap: 13 (ref 5–15)
BUN: 9 mg/dL (ref 8–23)
CO2: 24 mmol/L (ref 22–32)
Calcium: 9.5 mg/dL (ref 8.9–10.3)
Chloride: 88 mmol/L — ABNORMAL LOW (ref 98–111)
Creatinine, Ser: 0.54 mg/dL (ref 0.44–1.00)
GFR, Estimated: >60 mL/min (ref >60)
Glucose, Bld: 112 mg/dL — ABNORMAL HIGH (ref 70–99)
Potassium: 3.7 mmol/L (ref 3.5–5.1)
Sodium: 125 mmol/L — ABNORMAL LOW (ref 135–145)

## 2024-02-02 LAB — CBC
HCT: 42.2 % (ref 36.0–46.0)
Hemoglobin: 14.5 g/dL (ref 12.0–15.0)
MCH: 29 pg (ref 26.0–34.0)
MCHC: 34.4 g/dL (ref 30.0–36.0)
MCV: 84.4 fL (ref 80.0–100.0)
Platelets: 340 10*3/uL (ref 150–400)
RBC: 5 MIL/uL (ref 3.87–5.11)
RDW: 13.6 % (ref 11.5–15.5)
WBC: 8.2 10*3/uL (ref 4.0–10.5)
nRBC: 0 % (ref 0.0–0.2)

## 2024-02-02 LAB — PHOSPHORUS: Phosphorus: 3.3 mg/dL (ref 2.5–4.6)

## 2024-02-02 LAB — SODIUM, URINE, RANDOM: Sodium, Ur: 95 mmol/L

## 2024-02-02 LAB — MAGNESIUM: Magnesium: 1.8 mg/dL (ref 1.7–2.4)

## 2024-02-02 MED ORDER — SODIUM CHLORIDE 0.9 % IV SOLN: INTRAVENOUS | Status: AC

## 2024-02-02 MED ORDER — K PHOS MONO-SOD PHOS DI & MONO 155-852-130 MG PO TABS
500.0000 mg | ORAL_TABLET | Freq: Two times a day (BID) | ORAL | Status: AC
  Administered 2024-02-02 (×2): 500 mg via ORAL
  Filled 2024-02-02 (×2): qty 2

## 2024-02-02 NOTE — Plan of Care (Signed)
  Problem: Health Behavior/Discharge Planning: Goal: Ability to manage health-related needs will improve Outcome: Progressing   Problem: Clinical Measurements: Goal: Will remain free from infection Outcome: Progressing   Problem: Activity: Goal: Risk for activity intolerance will decrease Outcome: Progressing   Problem: Coping: Goal: Level of anxiety will decrease Outcome: Progressing   Problem: Safety: Goal: Ability to remain free from injury will improve Outcome: Progressing   

## 2024-02-02 NOTE — Significant Event (Signed)
 Rapid Response Event Note   Reason for Call :  unresponsive  Initial Focused Assessment:  Patient lying in bed, alert X4 with no focal deficits. NIH 0. Skin warm/dry, minimal edema noted in lower extremities. Lungs clear/diminished. Per family, patient sitting in chair (up for approximately one hour) talking when they noticed her chin drooping to chest and she was no longer responding. Staff entered room, placed pt into bed. No shaking reported, pt not diaphoretic. Pt was noted to be incontinent; however has been using purewick. Patient without complaints, states she felt like I was going to pass out.  94/71 (77) HR 63 RR 20 O2 96% RA CBG 145  Interventions/Plan of Care:  Orthostatic vitals Stroke called prior to my arrival by staff. Cancelled by Elgergawy MD. NIH 0, no focal deficits.  EEG  Event Summary:  MD Notified: Elgergawy MD Call Time: 1104 Arrival Time: 1106 End Time: 1125  Ever Hiss, RN

## 2024-02-02 NOTE — Progress Notes (Signed)
 PROGRESS NOTE    Sarah Harper  WJX:[id number] DOB: 10-27-1940 DOA: 01/28/2024 PCP: Clemens Curt, MD    Chief Complaint  Patient presents with   Loss of Consciousness    Brief Narrative:    Sarah Harper is a 83 y.o. female with past medical history significant for essential hypertension, generalized anxiety disorder, hypothyroidism, vitamin B12 and vitamin D  deficiency presented to the emergency department for evaluation for syncopal episode at home. Patient's husband reported that she has been seemed confused over the course of last 1 week corresponds with her taking a new blood pressure pill.  She was also recently started on Cymbalta .  No other new medications.  She has been acting differently, especially asking strange questions and going to bed earlier and staying in bed more often.  EMS was called.  In the ED patient was hypertensive.  Initial labs showed sodium of 121.    Urine sodium 81. Urinalysis with red cells.  CBC was unremarkable.  Troponin within normal range.  EKG showed normal sinus rhythm.  CT head without any acute findings.  Chest x-ray with low lung volumes.  In the ED patient was confused and restless and received Haldol .  Patient was then considered for admission to the hospital for further evaluation and treatment. - Patient was admitted for syncope, encephalopathic, workup significant for UTI, he remained significantly confused and altered, far from her baseline.   Assessment & Plan:   Principal Problem:   Acute metabolic encephalopathy Active Problems:   Hypothyroidism   Hyperosmolar hyponatremia   GAD (generalized anxiety disorder)   Essential hypertension   Acute cystitis   Vitamin D  deficiency   Metabolic encephalopathy   Acute metabolic encephalopathy likely secondary to UTI and hyponatremia. Acute toxic encephalopathy secondary to Cymbalta  -Plan, she is appropriate, awake alert x 3, significantly confused and altered on admission, she is  gradually improving but not back at baseline yet . - CT head with no acute findings . - This can be attributed to UTI, as well hyponatremia .  Likely to Cymbalta  -Need to hold Cymbalta  and avoid in the future -MRI brain with no acute findings   Syncopal went at home with recurrent event 6/14 during hospital stay - Likely in the setting of severe electrolyte derangements, hyponatremia and UTI . - 2D echo with EF 75%, continue to monitor on telemetry . -She is having some intermittent SVTs -Patient had episode of unresponsiveness late morning, blood pressure has been soft, not orthostatic, CBG within normal limit,, CT head with no acute finding, EEG significant for encephalopathic, but no seizures, currently at baseline   UTI, Enterococcus rine culture growing Enterococcus, the Rocephin  changed to IV ampicillin    Severe hypokalemia  - Replaced .  Hypomagnesemia  - Replaced   Hypophosphatemia -  replaced     Hypoosmolar hyponatremia history of previous hypoosmolar hyponatremia and SIADH. -Euvolemic . - Continue with fluid restrictions . - Will start on Ensure, , she was encouraged to increase her p.o. intake . - Avoid Cymbalta  - Will start on salt tabs   Hypothyroidism Continue levothyroxine .  TSH borderline high at 6.5.   Generalized anxiety disorder Was recently started on Cymbalta .  Will hold off for now in the context of hyponatremia and encephalopathy.   Essential hypertension Continue amlodipine  metoprolol  and hydralazine .  Continue to monitor blood pressure.  Overall labile, but couple significant elevated reading, but overall well-controlled, on as needed hydralazine    History of hip fracture status post nailing repair  in January 2025 Continue physical therapy.  Fall precautions.   GERD Discontinue Protonix  for now in the context of hyponatremia.     DVT prophylaxis: Lovenox  Code Status: Full Family Communication: discussed with husband at bedside 6/1 11, none  at bedside today Disposition:   Status is: Inpatient    Consultants:  None   Subjective: Patient with some delirium and confusion this morning, has improved, but she had a rapid response event late morning as she had an episode of loss of consciousness and unresponsiveness while sitting up on a chair.  Objective: Vitals:   02/02/24 1108 02/02/24 1113 02/02/24 1114 02/02/24 1115  BP: 117/62 106/60 112/66 122/72  Pulse: 79 81 87 91  Resp: (!) 21 20 20  (!) 21  Temp:      TempSrc:      SpO2: 97% 95% 95% 96%    Intake/Output Summary (Last 24 hours) at 02/02/2024 1614 Last data filed at 02/02/2024 1300 Gross per 24 hour  Intake 6 ml  Output 1300 ml  Net -1294 ml   There were no vitals filed for this visit.  Examination:    Awake Alert, Oriented X 3, but remains mildly confused, extremely frail and deconditioned Symmetrical Chest wall movement, Good air movement bilaterally, CTAB RRR,No Gallops,Rubs or new Murmurs, No Parasternal Heave +ve B.Sounds, Abd Soft, No tenderness, No rebound - guarding or rigidity. No Cyanosis, Clubbing or edema, No new Rash or bruise    Data Reviewed: I have personally reviewed following labs and imaging studies  CBC: Recent Labs  Lab 01/29/24 1445 01/30/24 0603 01/31/24 0741 02/01/24 0703 02/02/24 0727  WBC 10.0 11.1* 9.2 7.4 8.2  HGB 13.0 13.7 13.5 13.0 14.5  HCT 37.5 39.5 39.0 38.3 42.2  MCV 83.7 83.0 83.3 84.7 84.4  PLT 270 277 292 291 340    Basic Metabolic Panel: Recent Labs  Lab 01/30/24 0603 01/30/24 1458 01/31/24 0741 02/01/24 0703 02/02/24 0727  NA 123* 124* 125* 125* 125*  K 3.8 4.3 3.5 4.4 3.7  CL 88* 90* 88* 91* 88*  CO2 24 23 25 23 24   GLUCOSE 142* 133* 115* 98 112*  BUN 7* 12 9 13 9   CREATININE 0.57 0.60 0.58 0.54 0.54  CALCIUM  9.4 9.5 9.1 8.8* 9.5  MG 1.5*  --  1.9 1.8 1.8  PHOS 2.1*  --  2.5 2.4* 3.3    GFR: Estimated Creatinine Clearance: 46.8 mL/min (by C-G formula based on SCr of 0.54  mg/dL).  Liver Function Tests: Recent Labs  Lab 01/28/24 1225  AST 30  ALT 16  ALKPHOS 76  BILITOT 1.5*  PROT 6.4*  ALBUMIN 3.7    CBG: Recent Labs  Lab 01/28/24 1953 02/02/24 1106  GLUCAP 102* 145*     Recent Results (from the past 240 hours)  Urine Culture     Status: Abnormal   Collection Time: 01/28/24  8:22 PM   Specimen: Urine, Clean Catch  Result Value Ref Range Status   Specimen Description URINE, CLEAN CATCH  Final   Special Requests   Final    NONE Performed at [facility], [address], [place], [place] [address]    Culture >=100,000 COLONIES/mL ENTEROCOCCUS FAECALIS (A)  Final   Report Status 01/30/2024 FINAL  Final   Organism ID, Bacteria ENTEROCOCCUS FAECALIS (A)  Final      Susceptibility   Enterococcus faecalis - MIC*    AMPICILLIN  <=2 SENSITIVE Sensitive     NITROFURANTOIN <=16 SENSITIVE Sensitive  VANCOMYCIN 1 SENSITIVE Sensitive     * >=100,000 COLONIES/mL ENTEROCOCCUS FAECALIS         Radiology Studies: CT HEAD WO CONTRAST ( ) Result Date: 02/02/2024 CLINICAL DATA:  Loss of consciousness. EXAM: CT HEAD WITHOUT CONTRAST TECHNIQUE: Contiguous axial images were obtained from the base of the skull through the vertex without intravenous contrast. RADIATION DOSE REDUCTION: This exam was performed according to the departmental dose-optimization program which includes automated exposure control, adjustment of the mA and/or kV according to patient size and/or use of iterative reconstruction technique. COMPARISON:  Head CT 01/28/2024 and MRI 01/31/2024 FINDINGS: Brain: There is no evidence of an acute infarct, intracranial hemorrhage, mass, midline shift, or extra-axial fluid collection. There is mild-to-moderate cerebral atrophy. Patchy cerebral white matter hypodensities are unchanged and nonspecific but compatible with mild-to-moderate chronic small vessel ischemic disease. Vascular: Calcified atherosclerosis at the skull base. No  hyperdense vessel. Skull: No fracture or suspicious lesion. Sinuses/Orbits: Visualized paranasal sinuses and mastoid air cells are clear. Bilateral cataract extraction. Other: None. IMPRESSION: 1. No evidence of acute intracranial abnormality. 2. Mild-to-moderate chronic small vessel ischemic disease. Electronically Signed   By: Aundra Lee M.D.   On: 02/02/2024 12:05   MR BRAIN WO CONTRAST Result Date: 01/31/2024 EXAM DESCRIPTION: MR BRAIN WO CONTRAST CLINICAL HISTORY: AMS COMPARISON: None available TECHNIQUE: Non contrast MRI of the brain is performed according to our usual protocol including multiplanar multi sequence technique. FINDINGS: No acute intracranial hemorrhage, mass, edema, or hydrocephalus. Mild cortical atrophy. No encephalomalacia. Mild to moderate white matter disease suggesting chronic small vessel ischemic change. The vascular flow voids are unremarkable. No significant sinus disease. IMPRESSION: Age-related change without acute intracranial pathology. Electronically signed by: Basilio Both MD 01/31/2024 08:41 PM EDT RP Workstation: [id number]        Scheduled Meds:  amLODipine   2.5 mg Oral Daily   enoxaparin  (LOVENOX ) injection  40 mg Subcutaneous Q24H   feeding supplement  237 mL Oral TID BM   levothyroxine   75 mcg Oral QAC breakfast   metoprolol  tartrate  12.5 mg Oral BID   phosphorus  500 mg Oral BID   QUEtiapine   25 mg Oral Q24H   sodium chloride  flush  3 mL Intravenous Q12H   sodium chloride  flush  3 mL Intravenous Q12H   sodium chloride   1 g Oral TID WC   Continuous Infusions:  sodium chloride      ampicillin  (OMNIPEN) IV 1 g (02/02/24 1315)     LOS: 4 days      Seena Dadds, MD [facility]   To contact the attending provider between 7A-7P or the covering provider during after hours 7P-7A, please log into the web site [email or web address] and access using universal [place] password for that web site. If you do not have the password, please  call the hospital operator.  02/02/2024, 4:14 PM

## 2024-02-02 NOTE — Procedures (Signed)
 History: 83 year old female being evaluated for sudden mental status change, concern for seizure  EEG Duration: 22 minutes  Sedation: none  Patient State: Awake and drowsy  Technique: This EEG was acquired with electrodes placed according to the International 10-20 electrode system (including Fp1, Fp2, F3, F4, C3, C4, P3, P4, O1, O2, T3, T4, T5, T6, A1, A2, Fz, Cz, Pz). The following electrodes were missing or displaced: none.   Background: There is a posterior dominant rhythm of 8 to 9 Hz.  In addition, there is diffuse irregular generalized irregular theta and delta range activities that intrude in the background.  There is more prominent with drowsiness but well-formed sleep is not recorded.  Photic stimulation: Physiologic driving is not performed  EEG Abnormalities: Generalized irregular slow activity  Clinical Interpretation: This EEG is consistent with a generalized non-specific cerebral dysfunction(encephalopathy). There was no seizure or seizure predisposition recorded on this study. Please note that lack of epileptiform activity on EEG does not preclude the possibility of epilepsy.   Ann Keto, MD [facility]   If 7pm- 7am, please page neurology on call as listed in AMION.

## 2024-02-02 NOTE — Plan of Care (Signed)
  Problem: Clinical Measurements: Goal: Diagnostic test results will improve Outcome: Progressing Goal: Respiratory complications will improve Outcome: Progressing Goal: Cardiovascular complication will be avoided Outcome: Progressing   Problem: Nutrition: Goal: Adequate nutrition will be maintained Outcome: Progressing   Problem: Coping: Goal: Level of anxiety will decrease Outcome: Progressing

## 2024-02-02 NOTE — Progress Notes (Signed)
 Notified by telemetry patient had SVT, HR at 160s.RN rushed to the room.Patient was on SR with HR was on 80s.MD made aware.

## 2024-02-02 NOTE — Progress Notes (Signed)
STAT EEG complete - results pending. ? ?

## 2024-02-03 DIAGNOSIS — G9341 Metabolic encephalopathy: Secondary | ICD-10-CM | POA: Diagnosis not present

## 2024-02-03 LAB — BASIC METABOLIC PANEL WITH GFR
Anion gap: 12 (ref 5–15)
BUN: 16 mg/dL (ref 8–23)
CO2: 24 mmol/L (ref 22–32)
Calcium: 9 mg/dL (ref 8.9–10.3)
Chloride: 93 mmol/L — ABNORMAL LOW (ref 98–111)
Creatinine, Ser: 0.65 mg/dL (ref 0.44–1.00)
GFR, Estimated: >60 mL/min (ref >60)
Glucose, Bld: 122 mg/dL — ABNORMAL HIGH (ref 70–99)
Potassium: 3.8 mmol/L (ref 3.5–5.1)
Sodium: 129 mmol/L — ABNORMAL LOW (ref 135–145)

## 2024-02-03 LAB — CBC
HCT: 39.3 % (ref 36.0–46.0)
Hemoglobin: 13.3 g/dL (ref 12.0–15.0)
MCH: 29.4 pg (ref 26.0–34.0)
MCHC: 33.8 g/dL (ref 30.0–36.0)
MCV: 86.8 fL (ref 80.0–100.0)
Platelets: 306 10*3/uL (ref 150–400)
RBC: 4.53 MIL/uL (ref 3.87–5.11)
RDW: 14.1 % (ref 11.5–15.5)
WBC: 7.9 10*3/uL (ref 4.0–10.5)
nRBC: 0 % (ref 0.0–0.2)

## 2024-02-03 MED ORDER — SODIUM CHLORIDE 0.9 % IV SOLN: INTRAVENOUS | Status: DC

## 2024-02-03 NOTE — TOC Progression Note (Signed)
 Transition of Care [facility]) - Progression Note    Patient Details  Name: Sarah Harper MRN: [id number] Date of Birth: 02-03-1941  Transition of Care [facility]) CM/SW Contact  [address] [place], [place] Phone Number: 02/03/2024, 2:49 PM  Clinical Narrative:    CSW spoke with patient and confirmed plan to discharge to SNF. SNF process discussed. Patient states preference of [facility] however willing to consider additional facilities in the area in the event that [facility] is not available. Fl2 faxed, TOC to contact patient with bed offers as they become available.  Juno Bozard, LCSW Transition of Care     Expected Discharge Plan: Home w Home Health Services Barriers to Discharge: Continued Medical Work up  Expected Discharge Plan and Services In-house Referral: Clinical Social Work Discharge Planning Services: CM Consult Post Acute Care Choice: Home Health, Durable Medical Equipment Living arrangements for the past 2 months: Single Family Home                                       Social Determinants of Health (SDOH) Interventions SDOH Screenings   Food Insecurity: No Food Insecurity (01/29/2024)  Housing: Low Risk  (01/29/2024)  Transportation Needs: No Transportation Needs (01/29/2024)  Utilities: Not At Risk (01/29/2024)  Alcohol Screen: Low Risk  (12/26/2023)  Depression (PHQ2-9): High Risk (01/21/2024)  Financial Resource Strain: Low Risk  (12/26/2023)  Physical Activity: Insufficiently Active (12/26/2023)  Social Connections: Moderately Integrated (01/29/2024)  Stress: No Stress Concern Present (12/26/2023)  Tobacco Use: Low Risk  (01/29/2024)  Health Literacy: Adequate Health Literacy (12/26/2023)    Readmission Risk Interventions     No data to display

## 2024-02-03 NOTE — Plan of Care (Signed)
  Problem: Clinical Measurements: Goal: Ability to maintain clinical measurements within normal limits will improve Outcome: Progressing   Problem: Activity: Goal: Risk for activity intolerance will decrease Outcome: Progressing   Problem: Safety: Goal: Ability to remain free from injury will improve Outcome: Progressing   

## 2024-02-03 NOTE — Plan of Care (Signed)
   Problem: Clinical Measurements: Goal: Diagnostic test results will improve Outcome: Progressing Goal: Respiratory complications will improve Outcome: Progressing   Problem: Activity: Goal: Risk for activity intolerance will decrease Outcome: Progressing   Problem: Nutrition: Goal: Adequate nutrition will be maintained Outcome: Progressing   Problem: Safety: Goal: Ability to remain free from injury will improve Outcome: Progressing

## 2024-02-03 NOTE — Progress Notes (Signed)
 PROGRESS NOTE    Sarah Harper  WUJ:[id number] DOB: [birth year] DOA: 01/28/2024 PCP: Clemens Curt, MD    Chief Complaint  Patient presents with   Loss of Consciousness    Brief Narrative:    Sarah Harper is a 83 y.o. female with past medical history significant for essential hypertension, generalized anxiety disorder, hypothyroidism, vitamin B12 and vitamin D  deficiency presented to the emergency department for evaluation for syncopal episode at home. Patient's husband reported that she has been seemed confused over the course of last 1 week corresponds with her taking a new blood pressure pill.  She was also recently started on Cymbalta .  No other new medications.  She has been acting differently, especially asking strange questions and going to bed earlier and staying in bed more often.  EMS was called.  In the ED patient was hypertensive.  Initial labs showed sodium of 121.    Urine sodium 81. Urinalysis with red cells.  CBC was unremarkable.  Troponin within normal range.  EKG showed normal sinus rhythm.  CT head without any acute findings.  Chest x-ray with low lung volumes.  In the ED patient was confused and restless and received Haldol .  Patient was then considered for admission to the hospital for further evaluation and treatment. - Patient was admitted for syncope, encephalopathic, workup significant for UTI, he remained significantly confused and altered, far from her baseline.   Assessment & Plan:   Principal Problem:   Acute metabolic encephalopathy Active Problems:   Hypothyroidism   Hyperosmolar hyponatremia   GAD (generalized anxiety disorder)   Essential hypertension   Acute cystitis   Vitamin D  deficiency   Metabolic encephalopathy   Acute metabolic encephalopathy likely secondary to UTI and hyponatremia. Acute toxic encephalopathy secondary to Cymbalta  -Plan, she is appropriate, awake alert x 3, significantly confused and altered on admission, she is  gradually improving but not back at baseline yet . - CT head with no acute findings . - This can be attributed to UTI, as well hyponatremia .  Likely to Cymbalta  -Need to hold Cymbalta  and avoid in the future -MRI brain with no acute findings   Syncopal went at home with recurrent event 6/14 during hospital stay - Likely in the setting of severe electrolyte derangements, hyponatremia and UTI . - 2D echo with EF 75%, continue to monitor on telemetry . -She is having some intermittent SVTs -Patient had episode of unresponsiveness late morning, blood pressure has been soft, not orthostatic, CBG within normal limit,, CT head with no acute finding, EEG significant for encephalopathic, but no seizures, currently at baseline   UTI, Enterococcus rine culture growing Enterococcus, the Rocephin  changed to IV ampicillin    Severe hypokalemia  - Replaced .  Hypomagnesemia  - Replaced   Hypophosphatemia -  replaced    Hypoosmolar hyponatremia history of previous hypoosmolar hyponatremia and SIADH. -Euvolemic . - Continue with fluid restrictions . - Will start on Ensure, , she was encouraged to increase her p.o. intake . - Avoid Cymbalta  - Consult tablets -Much improved overnight after starting on IV NS, will continue for another 500 cc over 10 hours.   Hypothyroidism Continue levothyroxine .  TSH borderline high at 6.5.   Generalized anxiety disorder Was recently started on Cymbalta .  Will hold off for now in the context of hyponatremia and encephalopathy.   Essential hypertension Continue amlodipine  metoprolol  and hydralazine .  Continue to monitor blood pressure.  Overall labile, but couple significant elevated reading, but overall well-controlled, on  as needed hydralazine    History of hip fracture status post nailing repair in January 2025 Continue physical therapy.  Fall precautions.   GERD Discontinue Protonix  for now in the context of hyponatremia.     DVT prophylaxis:  Lovenox  Code Status: Full Family Communication: discussed with husband at bedside daily.   Disposition:   Status is: Inpatient    Consultants:  None   Subjective: She had a good night sleep, denies any complaints today. Objective: Vitals:   02/03/24 0635 02/03/24 0739 02/03/24 0800 02/03/24 1200  BP:  (!) 133/59 134/66 124/68  Pulse:  91 91 72  Resp:   18 18  Temp: 98.2 F (36.8 C) 98.2 F (36.8 C)  98 F (36.7 C)  TempSrc: Oral Oral  Oral  SpO2:  96% 90% 99%    Intake/Output Summary (Last 24 hours) at 02/03/2024 1313 Last data filed at 02/03/2024 0429 Gross per 24 hour  Intake 3 ml  Output 300 ml  Net -297 ml   There were no vitals filed for this visit.  Examination:    Awake Alert, Oriented X 3, but remains mildly confused, extremely frail and deconditioned Symmetrical Chest wall movement, Good air movement bilaterally, CTAB RRR,No Gallops,Rubs or new Murmurs, No Parasternal Heave +ve B.Sounds, Abd Soft, No tenderness, No rebound - guarding or rigidity. No Cyanosis, Clubbing or edema, No new Rash or bruise     Data Reviewed: I have personally reviewed following labs and imaging studies  CBC: Recent Labs  Lab 01/30/24 0603 01/31/24 0741 02/01/24 0703 02/02/24 0727 02/03/24 0736  WBC 11.1* 9.2 7.4 8.2 7.9  HGB 13.7 13.5 13.0 14.5 13.3  HCT 39.5 39.0 38.3 42.2 39.3  MCV 83.0 83.3 84.7 84.4 86.8  PLT 277 292 291 340 306    Basic Metabolic Panel: Recent Labs  Lab 01/30/24 0603 01/30/24 1458 01/31/24 0741 02/01/24 0703 02/02/24 0727 02/03/24 0736  NA 123* 124* 125* 125* 125* 129*  K 3.8 4.3 3.5 4.4 3.7 3.8  CL 88* 90* 88* 91* 88* 93*  CO2 24 23 25 23 24 24   GLUCOSE 142* 133* 115* 98 112* 122*  BUN 7* 12 9 13 9 16   CREATININE 0.57 0.60 0.58 0.54 0.54 0.65  CALCIUM  9.4 9.5 9.1 8.8* 9.5 9.0  MG 1.5*  --  1.9 1.8 1.8  --   PHOS 2.1*  --  2.5 2.4* 3.3  --     GFR: Estimated Creatinine Clearance: 46.8 mL/min (by C-G formula based on SCr of  0.65 mg/dL).  Liver Function Tests: Recent Labs  Lab 01/28/24 1225  AST 30  ALT 16  ALKPHOS 76  BILITOT 1.5*  PROT 6.4*  ALBUMIN 3.7    CBG: Recent Labs  Lab 01/28/24 1953 02/02/24 1106  GLUCAP 102* 145*     Recent Results (from the past 240 hours)  Urine Culture     Status: Abnormal   Collection Time: 01/28/24  8:22 PM   Specimen: Urine, Clean Catch  Result Value Ref Range Status   Specimen Description URINE, CLEAN CATCH  Final   Special Requests   Final    NONE Performed at [facility], [address] [place], [place] [address]    Culture >=100,000 COLONIES/mL ENTEROCOCCUS FAECALIS (A)  Final   Report Status 01/30/2024 FINAL  Final   Organism ID, Bacteria ENTEROCOCCUS FAECALIS (A)  Final      Susceptibility   Enterococcus faecalis - MIC*    AMPICILLIN  <=2 SENSITIVE Sensitive  NITROFURANTOIN <=16 SENSITIVE Sensitive     VANCOMYCIN 1 SENSITIVE Sensitive     * >=100,000 COLONIES/mL ENTEROCOCCUS FAECALIS         Radiology Studies: CT HEAD WO CONTRAST ( ) Result Date: 02/02/2024 CLINICAL DATA:  Loss of consciousness. EXAM: CT HEAD WITHOUT CONTRAST TECHNIQUE: Contiguous axial images were obtained from the base of the skull through the vertex without intravenous contrast. RADIATION DOSE REDUCTION: This exam was performed according to the departmental dose-optimization program which includes automated exposure control, adjustment of the mA and/or kV according to patient size and/or use of iterative reconstruction technique. COMPARISON:  Head CT 01/28/2024 and MRI 01/31/2024 FINDINGS: Brain: There is no evidence of an acute infarct, intracranial hemorrhage, mass, midline shift, or extra-axial fluid collection. There is mild-to-moderate cerebral atrophy. Patchy cerebral white matter hypodensities are unchanged and nonspecific but compatible with mild-to-moderate chronic small vessel ischemic disease. Vascular: Calcified atherosclerosis at the skull base. No  hyperdense vessel. Skull: No fracture or suspicious lesion. Sinuses/Orbits: Visualized paranasal sinuses and mastoid air cells are clear. Bilateral cataract extraction. Other: None. IMPRESSION: 1. No evidence of acute intracranial abnormality. 2. Mild-to-moderate chronic small vessel ischemic disease. Electronically Signed   By: Aundra Lee M.D.   On: 02/02/2024 12:05        Scheduled Meds:  amLODipine   2.5 mg Oral Daily   enoxaparin  (LOVENOX ) injection  40 mg Subcutaneous Q24H   feeding supplement  237 mL Oral TID BM   levothyroxine   75 mcg Oral QAC breakfast   metoprolol  tartrate  12.5 mg Oral BID   QUEtiapine   25 mg Oral Q24H   sodium chloride  flush  3 mL Intravenous Q12H   sodium chloride  flush  3 mL Intravenous Q12H   sodium chloride   1 g Oral TID WC   Continuous Infusions:  sodium chloride  50 mL/hr at 02/03/24 1205   ampicillin  (OMNIPEN) IV Stopped (02/03/24 0539)     LOS: 5 days      Sarah Dadds, MD [facility]   To contact the attending provider between 7A-7P or the covering provider during after hours 7P-7A, please log into the web site [email or web address] and access using universal [place] password for that web site. If you do not have the password, please call the hospital operator.  02/03/2024, 1:13 PM

## 2024-02-03 NOTE — NC FL2 (Signed)
 [place]  MEDICAID FL2 LEVEL OF CARE FORM     IDENTIFICATION  Patient Name: Sarah Harper Birthdate: Dec 30, 1940 Sex: female Admission Date (Current Location): 01/28/2024  [facility] and [place] Number:  Producer, television/film/video and Address:  [facility], [address] [place], [place] [address]      Provider Number: [id number]  Attending Physician Name and Address:  Elgergawy, Ardia Kraft, MD  Relative Name and Phone Number:       Current Level of Care: Hospital Recommended Level of Care: Skilled Nursing Facility Prior Approval Number:    Date Approved/Denied:   PASRR Number: [id number]  Discharge Plan: SNF    Current Diagnoses: Patient Active Problem List   Diagnosis Date Noted   Vitamin D  deficiency 01/29/2024   Metabolic encephalopathy 01/29/2024   Skin lesion of right leg 10/18/2023   Hyperosmolar hyponatremia 10/18/2023   Blood loss anemia 09/13/2023   Closed right hip fracture (HCC) 09/05/2023   Abnormal urinalysis 09/02/2023   Acute cystitis 08/21/2023   Current use of proton pump inhibitor 03/13/2023   Pain in joint, lower leg 01/30/2023   Senile purpura (HCC) 12/26/2022   Skin tear of right lower leg without complication 12/26/2022   Dysuria 10/21/2021   Elevated alkaline phosphatase level 03/11/2021   Palpitations 11/10/2020   Migraine    Acute metabolic encephalopathy    Essential hypertension    H/O: CVA (cerebrovascular accident) 11/08/2020   Headache 09/26/2019   Fall at home 06/02/2019   Colon cancer screening 02/05/2018   Degenerative joint disease (DJD) of lumbar spine 05/02/2017   Internal hemorrhoids 05/02/2017   Generalized osteoarthritis of hand 12/11/2016   GAD (generalized anxiety disorder) 12/11/2016   Estrogen deficiency 05/11/2016   Encounter for screening mammogram for breast cancer 05/11/2016   OSA (obstructive sleep apnea) 03/06/2016   Fatigue 01/25/2016   Left knee pain 10/18/2015   Snoring 10/18/2015    Routine general medical examination at a health care facility 02/28/2015   Encounter for Medicare annual wellness exam 02/06/2013   Hyperlipidemia 08/27/2012   Irregular heart beat 08/05/2012   Encounter for gynecological examination 12/20/2010   Osteopenia 12/17/2009   BACK PAIN, LUMBAR 07/02/2009   IRRITABLE BOWEL SYNDROME 04/02/2009   Asymptomatic postmenopausal status 12/10/2008   Hypothyroidism 12/05/2007   Meniere's disease 12/05/2007   GERD 12/05/2007    Orientation RESPIRATION BLADDER Height & Weight     Self  Normal Continent, External catheter Weight:   Height:     BEHAVIORAL SYMPTOMS/MOOD NEUROLOGICAL BOWEL NUTRITION STATUS      Continent Diet (See dc summary)  AMBULATORY STATUS COMMUNICATION OF NEEDS Skin     Verbally Normal                       Personal Care Assistance Level of Assistance  Bathing, Feeding, Dressing Bathing Assistance: Limited assistance Feeding assistance: Limited assistance Dressing Assistance: Limited assistance     Functional Limitations Info  Sight Sight Info: Impaired        SPECIAL CARE FACTORS FREQUENCY  PT (By licensed PT), OT (By licensed OT)     PT Frequency: 5x/week OT Frequency: 5x/week            Contractures Contractures Info: Not present    Additional Factors Info  Code Status, Allergies Code Status Info: Full Allergies Info: Aspirin , Nsaids, Tolmetin           Current Medications (02/03/2024):  This is the current hospital active medication  list Current Facility-Administered Medications  Medication Dose Route Frequency Provider Last Rate Last Admin   0.9 %  sodium chloride  infusion   Intravenous Continuous Elgergawy, Dawood S, MD 50 mL/hr at 02/03/24 1205 New Bag at 02/03/24 1205   acetaminophen  (TYLENOL ) tablet 650 mg  650 mg Oral Q6H PRN Sundil, Subrina, MD   650 mg at 01/31/24 2121   Or   acetaminophen  (TYLENOL ) suppository 650 mg  650 mg Rectal Q6H PRN Sundil, Subrina, MD       amLODipine   (NORVASC ) tablet 2.5 mg  2.5 mg Oral Daily Sundil, Subrina, MD   2.5 mg at 02/03/24 0836   ampicillin  (OMNIPEN) 1 g in sodium chloride  0.9 % 100 mL IVPB  1 g Intravenous Q8H Elgergawy, Dawood S, MD 300 mL/hr at 02/03/24 1317 1 g at 02/03/24 1317   enoxaparin  (LOVENOX ) injection 40 mg  40 mg Subcutaneous Q24H Sundil, Subrina, MD   40 mg at 02/03/24 0842   feeding supplement (ENSURE PLUS HIGH PROTEIN) liquid 237 mL  237 mL Oral TID BM Elgergawy, Dawood S, MD   237 mL at 02/03/24 1317   haloperidol  lactate (HALDOL ) injection 1 mg  1 mg Intravenous Q6H PRN Sundil, Subrina, MD   1 mg at 01/30/24 0355   hydrALAZINE  (APRESOLINE ) injection 10 mg  10 mg Intravenous Q6H PRN Sundil, Subrina, MD   10 mg at 02/02/24 0510   levothyroxine  (SYNTHROID ) tablet 75 mcg  75 mcg Oral QAC breakfast Sundil, Subrina, MD   75 mcg at 02/03/24 0444   metoprolol  tartrate (LOPRESSOR ) tablet 12.5 mg  12.5 mg Oral BID Sundil, Subrina, MD   12.5 mg at 02/03/24 1610   ondansetron  (ZOFRAN ) tablet 4 mg  4 mg Oral Q6H PRN Sundil, Subrina, MD       Or   ondansetron  (ZOFRAN ) injection 4 mg  4 mg Intravenous Q6H PRN Sundil, Subrina, MD       QUEtiapine  (SEROQUEL ) tablet 25 mg  25 mg Oral Q24H Elgergawy, Dawood S, MD   25 mg at 02/02/24 2049   sodium chloride  flush (NS) 0.9 % injection 3 mL  3 mL Intravenous Q12H Sundil, Subrina, MD   3 mL at 02/02/24 2050   sodium chloride  flush (NS) 0.9 % injection 3 mL  3 mL Intravenous Q12H Sundil, Subrina, MD   3 mL at 02/02/24 2050   sodium chloride  flush (NS) 0.9 % injection 3 mL  3 mL Intravenous PRN Sundil, Subrina, MD       sodium chloride  tablet 1 g  1 g Oral TID WC Elgergawy, Dawood S, MD   1 g at 02/03/24 1205   sucralfate  (CARAFATE ) tablet 1 g  1 g Oral QID PRN Sundil, Subrina, MD         Discharge Medications: Please see discharge summary for a list of discharge medications.  Relevant Imaging Results:  Relevant Lab Results:   Additional Information SSN: [id number]  [place], [place]

## 2024-02-04 DIAGNOSIS — G9341 Metabolic encephalopathy: Secondary | ICD-10-CM | POA: Diagnosis not present

## 2024-02-04 LAB — BASIC METABOLIC PANEL WITH GFR
Anion gap: 7 (ref 5–15)
BUN: 19 mg/dL (ref 8–23)
CO2: 28 mmol/L (ref 22–32)
Calcium: 8.9 mg/dL (ref 8.9–10.3)
Chloride: 97 mmol/L — ABNORMAL LOW (ref 98–111)
Creatinine, Ser: 0.74 mg/dL (ref 0.44–1.00)
GFR, Estimated: >60 mL/min (ref >60)
Glucose, Bld: 92 mg/dL (ref 70–99)
Potassium: 4.2 mmol/L (ref 3.5–5.1)
Sodium: 132 mmol/L — ABNORMAL LOW (ref 135–145)

## 2024-02-04 LAB — CBC
HCT: 38 % (ref 36.0–46.0)
Hemoglobin: 12.9 g/dL (ref 12.0–15.0)
MCH: 29.8 pg (ref 26.0–34.0)
MCHC: 33.9 g/dL (ref 30.0–36.0)
MCV: 87.8 fL (ref 80.0–100.0)
Platelets: 306 10*3/uL (ref 150–400)
RBC: 4.33 MIL/uL (ref 3.87–5.11)
RDW: 14.2 % (ref 11.5–15.5)
WBC: 8.4 10*3/uL (ref 4.0–10.5)
nRBC: 0 % (ref 0.0–0.2)

## 2024-02-04 MED ORDER — QUETIAPINE FUMARATE 25 MG PO TABS
12.5000 mg | ORAL_TABLET | ORAL | Status: DC
  Administered 2024-02-04: 12.5 mg via ORAL
  Filled 2024-02-04: qty 1

## 2024-02-04 NOTE — Plan of Care (Signed)
  Problem: Health Behavior/Discharge Planning: Goal: Ability to manage health-related needs will improve Outcome: Progressing   Problem: Clinical Measurements: Goal: Will remain free from infection Outcome: Progressing   Problem: Activity: Goal: Risk for activity intolerance will decrease Outcome: Progressing   Problem: Safety: Goal: Ability to remain free from injury will improve Outcome: Progressing   

## 2024-02-04 NOTE — Progress Notes (Signed)
 Occupational Therapy Treatment Patient Details Name: Sarah Harper MRN: [id number] DOB: [birth year] Today's Date: 02/04/2024   History of present illness Patient is an 83 y.o. female presented to the ED on 01/29/24 for evaluation for syncopal episode at home. Pt's spouse reports increased confusion in week PTA. Pt hypertensive in ED. PT admitted for encephalopathy suspected due to UTI. PMH significant for HTN, GERD, osteopenia, CVA, syncopal episodes, TIA, DDD, anxiety,  R hip fx s/p IMN 09/06/23.   OT comments  Patient demonstrating good gains with OT treatment. Patient was CGA and verbal cues to get to EOB and able to perform transfer to recliner with RW and min assist. Patient able to perform grooming tasks seated in recliner with setup and supervision. Patient was led in BUE HEP with yellow therapy band to assist with sit to stands and activity tolerance. Patient will benefit from continued inpatient follow up therapy, <3 hours/day. Acute OT to continue to follow to address established goals to facilitate DC to next venue of care.        If plan is discharge home, recommend the following:  A little help with walking and/or transfers;A little help with bathing/dressing/bathroom;Assist for transportation;Assistance with cooking/housework;Help with stairs or ramp for entrance   Equipment Recommendations  None recommended by OT    Recommendations for Other Services      Precautions / Restrictions Precautions Precautions: Fall Recall of Precautions/Restrictions: Impaired Restrictions Weight Bearing Restrictions Per Provider Order: No       Mobility Bed Mobility Overal bed mobility: Needs Assistance Bed Mobility: Supine to Sit     Supine to sit: Contact guard, HOB elevated, Used rails     General bed mobility comments: cues for rail use with CGA to scoot towards EOB    Transfers Overall transfer level: Needs assistance Equipment used: Rolling walker (2 wheels) Transfers: Sit  to/from Stand, Bed to chair/wheelchair/BSC Sit to Stand: Min assist     Step pivot transfers: Min assist     General transfer comment: cues for hand placement and min assist to power up     Balance Overall balance assessment: Needs assistance Sitting-balance support: Feet supported Sitting balance-Leahy Scale: Poor Sitting balance - Comments: supervision on EOB   Standing balance support: Bilateral upper extremity supported, During functional activity Standing balance-Leahy Scale: Poor Standing balance comment: reliant on UE support                           ADL either performed or assessed with clinical judgement   ADL Overall ADL's : Needs assistance/impaired     Grooming: Wash/dry hands;Wash/dry face;Oral care;Brushing hair;Supervision/safety;Sitting           Upper Body Dressing : Minimal assistance;Sitting Upper Body Dressing Details (indicate cue type and reason): gown Lower Body Dressing: Maximal assistance;Sitting/lateral leans Lower Body Dressing Details (indicate cue type and reason): to donn socks Toilet Transfer: Minimal assistance;Rolling walker (2 wheels) Toilet Transfer Details (indicate cue type and reason): simulated to recliner         Functional mobility during ADLs: Minimal assistance      Extremity/Trunk Assessment              Vision       Perception     Praxis     Communication Communication Communication: No apparent difficulties   Cognition Arousal: Alert Behavior During Therapy: WFL for tasks assessed/performed Cognition: Cognition impaired   Orientation impairments: Time Awareness: Intellectual awareness impaired, Online awareness  impaired Memory impairment (select all impairments): Working memory Attention impairment (select first level of impairment): Selective attention Executive functioning impairment (select all impairments): Reasoning, Problem solving OT - Cognition Comments: more alert, aware about  potential discharge to SNF                 Following commands: Impaired Following commands impaired: Follows one step commands inconsistently      Cueing   Cueing Techniques: Verbal cues, Visual cues  Exercises Exercises: General Upper Extremity General Exercises - Upper Extremity Shoulder Flexion: Strengthening, Both, 5 reps, Seated, Theraband Theraband Level (Shoulder Flexion): Level 1 (Yellow) Shoulder Horizontal ABduction: Both, 10 reps, Strengthening, Seated, Theraband Theraband Level (Shoulder Horizontal Abduction): Level 1 (Yellow) Elbow Flexion: Strengthening, Both, 10 reps, Seated, Theraband Theraband Level (Elbow Flexion): Level 1 (Yellow) Elbow Extension: Strengthening, Both, 10 reps, Seated, Theraband Theraband Level (Elbow Extension): Level 1 (Yellow)    Shoulder Instructions       General Comments VSS    Pertinent Vitals/ Pain       Pain Assessment Pain Assessment: 0-10 Faces Pain Scale: No hurt Pain Intervention(s): Monitored during session  Home Living                                          Prior Functioning/Environment              Frequency  Min 2X/week        Progress Toward Goals  OT Goals(current goals can now be found in the care plan section)  Progress towards OT goals: Progressing toward goals  Acute Rehab OT Goals Patient Stated Goal: to get more rehab OT Goal Formulation: With patient/family Time For Goal Achievement: 02/13/24 Potential to Achieve Goals: Fair ADL Goals Pt Will Perform Grooming: with set-up;sitting Pt Will Perform Upper Body Dressing: with set-up;sitting Pt Will Perform Lower Body Dressing: with min assist;sit to/from stand Pt Will Transfer to Toilet: with supervision;ambulating  Plan      Co-evaluation                 AM-PAC OT 6 Clicks Daily Activity     Outcome Measure   Help from another person eating meals?: A Little Help from another person taking care of  personal grooming?: A Little Help from another person toileting, which includes using toliet, bedpan, or urinal?: A Little Help from another person bathing (including washing, rinsing, drying)?: A Lot Help from another person to put on and taking off regular upper body clothing?: A Little Help from another person to put on and taking off regular lower body clothing?: A Lot 6 Click Score: 16    End of Session Equipment Utilized During Treatment: Gait belt;Rolling walker (2 wheels)  OT Visit Diagnosis: Unsteadiness on feet (R26.81);Other abnormalities of gait and mobility (R26.89);Repeated falls (R29.6);Muscle weakness (generalized) (M62.81);History of falling (Z91.81)   Activity Tolerance Patient tolerated treatment well   Patient Left in chair;with call bell/phone within reach;with chair alarm set;with family/visitor present   Nurse Communication Mobility status        Time: 1610-9604 OT Time Calculation (min): 28 min  Charges: OT General Charges $OT Visit: 1 Visit OT Treatments $Self Care/Home Management : 8-22 mins $Therapeutic Exercise: 8-22 mins  Anitra Barn, OTA Acute Rehabilitation Services  Office [phone]   Jovita Nipper 02/04/2024, 12:30 PM

## 2024-02-04 NOTE — Progress Notes (Signed)
 D6 of amp for UTI. Ok to stop per Dr. Osborne Blazer.  Ivery Marking, PharmD, BCIDP, AAHIVP, CPP Infectious Disease Pharmacist 02/04/2024 10:43 AM

## 2024-02-04 NOTE — Plan of Care (Signed)
  Problem: Activity: Goal: Risk for activity intolerance will decrease Outcome: Progressing   Problem: Elimination: Goal: Will not experience complications related to urinary retention Outcome: Progressing   Problem: Pain Managment: Goal: General experience of comfort will improve and/or be controlled Outcome: Progressing   Problem: Safety: Goal: Ability to remain free from injury will improve Outcome: Progressing   Problem: Skin Integrity: Goal: Risk for impaired skin integrity will decrease Outcome: Progressing

## 2024-02-04 NOTE — Progress Notes (Signed)
 PROGRESS NOTE    Sarah Harper  ZOX:[id number] DOB: 10-31-1940 DOA: 01/28/2024 PCP: Clemens Curt, MD    Chief Complaint  Patient presents with   Loss of Consciousness    Brief Narrative:    Sarah Harper is a 83 y.o. female with past medical history significant for essential hypertension, generalized anxiety disorder, hypothyroidism, vitamin B12 and vitamin D  deficiency presented to the emergency department for evaluation for syncopal episode at home. Patient's husband reported that she has been seemed confused over the course of last 1 week corresponds with her taking a new blood pressure pill.  She was also recently started on Cymbalta .  No other new medications.  She has been acting differently, especially asking strange questions and going to bed earlier and staying in bed more often.  EMS was called.  In the ED patient was hypertensive.  Initial labs showed sodium of 121.    Urine sodium 81. Urinalysis with red cells.  CBC was unremarkable.  Troponin within normal range.  EKG showed normal sinus rhythm.  CT head without any acute findings.  Chest x-ray with low lung volumes.  In the ED patient was confused and restless and received Haldol .  Patient was then considered for admission to the hospital for further evaluation and treatment. - Patient was admitted for syncope, encephalopathic, workup significant for UTI, he remained significantly confused and altered, far from her baseline.   Assessment & Plan:   Principal Problem:   Acute metabolic encephalopathy Active Problems:   Hypothyroidism   Hyperosmolar hyponatremia   GAD (generalized anxiety disorder)   Essential hypertension   Acute cystitis   Vitamin D  deficiency   Metabolic encephalopathy   Acute metabolic encephalopathy likely secondary to UTI and hyponatremia. Acute toxic encephalopathy secondary to Cymbalta  -Plan, she is appropriate, awake alert x 3, significantly confused and altered on admission, she is  gradually improving but not back at baseline yet . - CT head with no acute findings . - This can be attributed to UTI, as well hyponatremia .  Likely to Cymbalta  - Continue to hold Cymbalta  and avoid in the future -MRI brain with no acute findings   Syncopal went at home with recurrent event 6/14 during hospital stay - Likely in the setting of severe electrolyte derangements, hyponatremia and UTI . - 2D echo with EF 75%, continue to monitor on telemetry . -She is having some intermittent SVTs -Patient had episode of unresponsiveness late morning, blood pressure has been soft, not orthostatic, CBG within normal limit,, CT head with no acute finding, EEG significant for encephalopathic, but no seizures, currently at baseline   UTI, Enterococcus rine culture growing Enterococcus, he did with IV ampicillin    Severe hypokalemia  - Replaced .  Hypomagnesemia  - Replaced   Hypophosphatemia -  replaced    Hypoosmolar hyponatremia history of previous hypoosmolar hyponatremia and SIADH. -Euvolemic . - Continue with fluid restrictions . - Will start on Ensure, , she was encouraged to increase her p.o. intake . - Avoid Cymbalta  - Consult tablets - Continuous to improve IV NS, 132 today.   Hypothyroidism Continue levothyroxine .  TSH borderline high at 6.5.   Generalized anxiety disorder Was recently started on Cymbalta .  Will hold off for now in the context of hyponatremia and encephalopathy.   Essential hypertension Continue amlodipine  metoprolol  and hydralazine .  Continue to monitor blood pressure.  Overall labile, but couple significant elevated reading, but overall well-controlled, on as needed hydralazine    History of hip fracture  status post nailing repair in January 2025 Continue physical therapy.  Fall precautions.   GERD Discontinue Protonix  for now in the context of hyponatremia.     DVT prophylaxis: Lovenox  Code Status: Full Family Communication: discussed with  husband at bedside daily.   Disposition:   Status is: Inpatient    Consultants:  None   Subjective: She has a good night sleep, mentation back to baseline, appetite is good. Objective: Vitals:   02/03/24 2350 02/04/24 0301 02/04/24 0827 02/04/24 1227  BP: 117/76 111/78 135/72 125/63  Pulse: 92 82 92   Resp: 20 18 18 18   Temp: 99 F (37.2 C) 97.7 F (36.5 C) 99.4 F (37.4 C) 97.8 F (36.6 C)  TempSrc: Axillary Oral Oral Oral  SpO2: 97% 97% 95% 98%    Intake/Output Summary (Last 24 hours) at 02/04/2024 1407 Last data filed at 02/04/2024 1300 Gross per 24 hour  Intake 606 ml  Output 1550 ml  Net -944 ml   There were no vitals filed for this visit.  Examination:  Awake Alert, Oriented X 3, frail Symmetrical Chest wall movement, Good air movement bilaterally, CTAB RRR,No Gallops,Rubs or new Murmurs, No Parasternal Heave +ve B.Sounds, Abd Soft, No tenderness, No rebound - guarding or rigidity. No Cyanosis, Clubbing or edema, No new Rash or bruise     Data Reviewed: I have personally reviewed following labs and imaging studies  CBC: Recent Labs  Lab 01/31/24 0741 02/01/24 0703 02/02/24 0727 02/03/24 0736 02/04/24 0548  WBC 9.2 7.4 8.2 7.9 8.4  HGB 13.5 13.0 14.5 13.3 12.9  HCT 39.0 38.3 42.2 39.3 38.0  MCV 83.3 84.7 84.4 86.8 87.8  PLT 292 291 340 306 306    Basic Metabolic Panel: Recent Labs  Lab 01/30/24 0603 01/30/24 1458 01/31/24 0741 02/01/24 0703 02/02/24 0727 02/03/24 0736 02/04/24 0548  NA 123*   < > 125* 125* 125* 129* 132*  K 3.8   < > 3.5 4.4 3.7 3.8 4.2  CL 88*   < > 88* 91* 88* 93* 97*  CO2 24   < > 25 23 24 24 28   GLUCOSE 142*   < > 115* 98 112* 122* 92  BUN 7*   < > 9 13 9 16 19   CREATININE 0.57   < > 0.58 0.54 0.54 0.65 0.74  CALCIUM  9.4   < > 9.1 8.8* 9.5 9.0 8.9  MG 1.5*  --  1.9 1.8 1.8  --   --   PHOS 2.1*  --  2.5 2.4* 3.3  --   --    < > = values in this interval not displayed.    GFR: Estimated Creatinine Clearance:  46.8 mL/min (by C-G formula based on SCr of 0.74 mg/dL).  Liver Function Tests: No results for input(s): AST, ALT, ALKPHOS, BILITOT, PROT, ALBUMIN in the last 168 hours.   CBG: Recent Labs  Lab 01/28/24 1953 02/02/24 1106  GLUCAP 102* 145*     Recent Results (from the past 240 hours)  Urine Culture     Status: Abnormal   Collection Time: 01/28/24  8:22 PM   Specimen: Urine, Clean Catch  Result Value Ref Range Status   Specimen Description URINE, CLEAN CATCH  Final   Special Requests   Final    NONE Performed at [facility], [address] [place], [place] [address]    Culture >=100,000 COLONIES/mL ENTEROCOCCUS FAECALIS (A)  Final   Report Status 01/30/2024 FINAL  Final   Organism  ID, Bacteria ENTEROCOCCUS FAECALIS (A)  Final      Susceptibility   Enterococcus faecalis - MIC*    AMPICILLIN  <=2 SENSITIVE Sensitive     NITROFURANTOIN <=16 SENSITIVE Sensitive     VANCOMYCIN 1 SENSITIVE Sensitive     * >=100,000 COLONIES/mL ENTEROCOCCUS FAECALIS         Radiology Studies: No results found.       Scheduled Meds:  amLODipine   2.5 mg Oral Daily   enoxaparin  (LOVENOX ) injection  40 mg Subcutaneous Q24H   feeding supplement  237 mL Oral TID BM   levothyroxine   75 mcg Oral QAC breakfast   metoprolol  tartrate  12.5 mg Oral BID   QUEtiapine   25 mg Oral Q24H   sodium chloride  flush  3 mL Intravenous Q12H   sodium chloride  flush  3 mL Intravenous Q12H   sodium chloride   1 g Oral TID WC   Continuous Infusions:  sodium chloride  50 mL/hr at 02/03/24 1927     LOS: 6 days      Seena Dadds, MD [facility]   To contact the attending provider between 7A-7P or the covering provider during after hours 7P-7A, please log into the web site [email or web address] and access using universal [place] password for that web site. If you do not have the password, please call the hospital operator.  02/04/2024, 2:07 PM

## 2024-02-04 NOTE — Progress Notes (Signed)
 Physical Therapy Treatment Patient Details Name: Sarah Harper MRN: [id number] DOB: Apr 16, 1941 Today's Date: 02/04/2024   History of Present Illness Patient is an 83 y.o. female presented to the ED on 01/29/24 for evaluation for syncopal episode at home. Pt's spouse reports increased confusion in week PTA. Pt hypertensive in ED. PT admitted for encephalopathy suspected due to UTI. PMH significant for HTN, GERD, osteopenia, CVA, syncopal episodes, TIA, DDD, anxiety,  R hip fx s/p IMN 09/06/23.    PT Comments  Pt received in chair, husband present. Pt given shoes and socks and was able to don them herself. Pt ambulated to bathroom with RW and min A, was able to use bathroom and performed perineal care with min A. Pt ambulated in hallway with RW and min A. Is able to pick feet up better today than last session, continues to have very short step length and worked on gait symmetry with ambulation. Patient will benefit from continued inpatient follow up therapy, <3 hours/day. PT will continue to follow.     If plan is discharge home, recommend the following: A little help with walking and/or transfers;A little help with bathing/dressing/bathroom;Assistance with cooking/housework;Direct supervision/assist for medications management;Assist for transportation;Help with stairs or ramp for entrance;Supervision due to cognitive status   Can travel by private vehicle     Yes  Equipment Recommendations  BSC/3in1    Recommendations for Other Services       Precautions / Restrictions Precautions Precautions: Fall Recall of Precautions/Restrictions: Impaired Restrictions Weight Bearing Restrictions Per Provider Order: No     Mobility  Bed Mobility               General bed mobility comments: pt up in recliner    Transfers Overall transfer level: Needs assistance Equipment used: Rolling walker (2 wheels) Transfers: Sit to/from Stand Sit to Stand: Min assist, Contact guard assist            General transfer comment: Min A for first sit>stand from recliner, needed assist for fwd wt shift. CGA for sit>stand from toilet with use of grab bar    Ambulation/Gait Ambulation/Gait assistance: Min assist Gait Distance (Feet): 85 Feet (10', 75') Assistive device: Rolling walker (2 wheels) Gait Pattern/deviations: Step-through pattern, Decreased step length - right, Decreased step length - left, Decreased stride length, Shuffle, Trunk flexed Gait velocity: decr Gait velocity interpretation: <1.8 ft/sec, indicate of risk for recurrent falls Pre-gait activities: standing marching General Gait Details: assisted pt with setp up for donning socks and shoes, she was then able to complete on her own. Min A to ambulate with RW, vc's for equal step length and pt was able to achieve this. Decreased step length but was picking feet up off floor and no posterior bias today, much improved from last session. Still moving very slowly with minimal ability to correct for LOB so fall risk remains very high with gait   Stairs             Wheelchair Mobility     Tilt Bed    Modified Rankin (Stroke Patients Only)       Balance Overall balance assessment: Needs assistance Sitting-balance support: Feet supported Sitting balance-Leahy Scale: Fair     Standing balance support: Bilateral upper extremity supported, During functional activity Standing balance-Leahy Scale: Poor Standing balance comment: able to stand with unilateral support and pull up clothes with other hand  Communication Communication Communication: No apparent difficulties  Cognition Arousal: Alert Behavior During Therapy: WFL for tasks assessed/performed   PT - Cognitive impairments: Awareness, Sequencing, Problem solving, Safety/Judgement                       PT - Cognition Comments: pt much clearer today, asking good questions, able to recall home PLOF and relay  how she feels differently from baseline Following commands: Intact      Cueing Cueing Techniques: Verbal cues  Exercises General Exercises - Lower Extremity Hip ABduction/ADduction:  (yellow t-band loop)    General Comments General comments (skin integrity, edema, etc.): VSS      Pertinent Vitals/Pain Pain Assessment Pain Assessment: No/denies pain    Home Living                          Prior Function            PT Goals (current goals can now be found in the care plan section) Acute Rehab PT Goals Patient Stated Goal: get better and home PT Goal Formulation: With patient/family Time For Goal Achievement: 02/13/24 Potential to Achieve Goals: Good Progress towards PT goals: Progressing toward goals    Frequency    Min 2X/week      PT Plan      Co-evaluation              AM-PAC PT 6 Clicks Mobility   Outcome Measure  Help needed turning from your back to your side while in a flat bed without using bedrails?: A Little Help needed moving from lying on your back to sitting on the side of a flat bed without using bedrails?: A Lot Help needed moving to and from a bed to a chair (including a wheelchair)?: A Little Help needed standing up from a chair using your arms (e.g., wheelchair or bedside chair)?: A Little Help needed to walk in hospital room?: A Little Help needed climbing 3-5 steps with a railing? : Total 6 Click Score: 15    End of Session Equipment Utilized During Treatment: Gait belt Activity Tolerance: Patient tolerated treatment well Patient left: with call bell/phone within reach;with family/visitor present;in chair Nurse Communication: Mobility status PT Visit Diagnosis: Other abnormalities of gait and mobility (R26.89);Muscle weakness (generalized) (M62.81);History of falling (Z91.81);Difficulty in walking, not elsewhere classified (R26.2);Other symptoms and signs involving the nervous system (R29.898)     Time: 1108-1140 PT  Time Calculation (min) (ACUTE ONLY): 32 min  Charges:    $Gait Training: 8-22 mins $Therapeutic Activity: 8-22 mins PT General Charges $$ ACUTE PT VISIT: 1 Visit                     Amey Ka, PT  Acute Rehab Services Secure chat preferred Office [phone]    Deloris Fetters [facility] 02/04/2024, 12:41 PM

## 2024-02-04 NOTE — TOC Progression Note (Signed)
 Transition of Care [facility]) - Progression Note    Patient Details  Name: Sarah Harper MRN: [id number] Date of Birth: 08-13-1941  [facility]) CM/SW Contact  Jannice Mends, LCSW Phone Number: 02/04/2024, 1:46 PM  Clinical Narrative:    CSW met with patient and spouse to discuss SNF bed offer from [facility]. They asked about [facility] and CSW explained they are not in network with insurance. They reported understanding and agreed to proceed with [facility] since patient has been there before. CSW initiated insurance process, Certification # [id number].    Expected Discharge Plan: Skilled Nursing Facility Barriers to Discharge: Continued Medical Work up  Expected Discharge Plan and Services In-house Referral: Clinical Social Work Discharge Planning Services: CM Consult Post Acute Care Choice: Home Health, Durable Medical Equipment Living arrangements for the past 2 months: Single Family Home                                       Social Determinants of Health (SDOH) Interventions SDOH Screenings   Food Insecurity: No Food Insecurity (01/29/2024)  Housing: Low Risk  (01/29/2024)  Transportation Needs: No Transportation Needs (01/29/2024)  Utilities: Not At Risk (01/29/2024)  Alcohol Screen: Low Risk  (12/26/2023)  Depression (PHQ2-9): High Risk (01/21/2024)  Financial Resource Strain: Low Risk  (12/26/2023)  Physical Activity: Insufficiently Active (12/26/2023)  Social Connections: Moderately Integrated (01/29/2024)  Stress: No Stress Concern Present (12/26/2023)  Tobacco Use: Low Risk  (01/29/2024)  Health Literacy: Adequate Health Literacy (12/26/2023)    Readmission Risk Interventions     No data to display

## 2024-02-05 ENCOUNTER — Ambulatory Visit

## 2024-02-05 DIAGNOSIS — G9341 Metabolic encephalopathy: Secondary | ICD-10-CM | POA: Diagnosis not present

## 2024-02-05 LAB — CBC
HCT: 36.8 % (ref 36.0–46.0)
Hemoglobin: 12.2 g/dL (ref 12.0–15.0)
MCH: 29.3 pg (ref 26.0–34.0)
MCHC: 33.2 g/dL (ref 30.0–36.0)
MCV: 88.2 fL (ref 80.0–100.0)
Platelets: 316 10*3/uL (ref 150–400)
RBC: 4.17 MIL/uL (ref 3.87–5.11)
RDW: 14.3 % (ref 11.5–15.5)
WBC: 8.6 10*3/uL (ref 4.0–10.5)
nRBC: 0 % (ref 0.0–0.2)

## 2024-02-05 LAB — BASIC METABOLIC PANEL WITH GFR
Anion gap: 9 (ref 5–15)
BUN: 17 mg/dL (ref 8–23)
CO2: 25 mmol/L (ref 22–32)
Calcium: 9.1 mg/dL (ref 8.9–10.3)
Chloride: 98 mmol/L (ref 98–111)
Creatinine, Ser: 0.56 mg/dL (ref 0.44–1.00)
GFR, Estimated: >60 mL/min (ref >60)
Glucose, Bld: 99 mg/dL (ref 70–99)
Potassium: 4 mmol/L (ref 3.5–5.1)
Sodium: 132 mmol/L — ABNORMAL LOW (ref 135–145)

## 2024-02-05 MED ORDER — ACETAMINOPHEN 325 MG PO TABS: 650.0000 mg | ORAL_TABLET | Freq: Four times a day (QID) | ORAL | Status: AC | PRN

## 2024-02-05 MED ORDER — ENSURE PLUS HIGH PROTEIN PO LIQD: 237.0000 mL | Freq: Three times a day (TID) | ORAL | Status: DC

## 2024-02-05 MED ORDER — LORAZEPAM 0.5 MG PO TABS: 0.5000 mg | ORAL_TABLET | Freq: Every day | ORAL | 0 refills | Status: AC | PRN

## 2024-02-05 NOTE — Discharge Instructions (Signed)
 Follow with Primary MD Tower, Manley Seeds, MD /SNF physician  Get CBC, CMP,  checked  by Primary MD next visit.    Activity: As tolerated with Full fall precautions use walker/cane & assistance as needed  Disposition SNF   Diet: Heart Healthy  , 1.8 L /24 hr  fluid restrictions, with feeding assistance and aspiration precautions.  For Heart failure patients - Check your Weight same time everyday, if you gain over 2 pounds, or you develop in leg swelling, experience more shortness of breath or chest pain, call your Primary MD immediately. Follow Cardiac Low Salt Diet and 1.5 lit/day fluid restriction.   On your next visit with your primary care physician please Get Medicines reviewed and adjusted.   Please request your Prim.MD to go over all Hospital Tests and Procedure/Radiological results at the follow up, please get all Hospital records sent to your Prim MD by signing hospital release before you go home.   If you experience worsening of your admission symptoms, develop shortness of breath, life threatening emergency, suicidal or homicidal thoughts you must seek medical attention immediately by calling 911 or calling your MD immediately  if symptoms less severe.  You Must read complete instructions/literature along with all the possible adverse reactions/side effects for all the Medicines you take and that have been prescribed to you. Take any new Medicines after you have completely understood and accpet all the possible adverse reactions/side effects.   Do not drive, operating heavy machinery, perform activities at heights, swimming or participation in water activities or provide baby sitting services if your were admitted for syncope or siezures until you have seen by Primary MD or a Neurologist and advised to do so again.  Do not drive when taking Pain medications.    Do not take more than prescribed Pain, Sleep and Anxiety Medications  Special Instructions: If you have smoked or  chewed Tobacco  in the last 2 yrs please stop smoking, stop any regular Alcohol  and or any Recreational drug use.  Wear Seat belts while driving.   Please note  You were cared for by a hospitalist during your hospital stay. If you have any questions about your discharge medications or the care you received while you were in the hospital after you are discharged, you can call the unit and asked to speak with the hospitalist on call if the hospitalist that took care of you is not available. Once you are discharged, your primary care physician will handle any further medical issues. Please note that NO REFILLS for any discharge medications will be authorized once you are discharged, as it is imperative that you return to your primary care physician (or establish a relationship with a primary care physician if you do not have one) for your aftercare needs so that they can reassess your need for medications and monitor your lab values.

## 2024-02-05 NOTE — Discharge Summary (Signed)
 Physician Discharge Summary  Sarah Harper ZOX:[id number] DOB: 12/20/1940 DOA: 01/28/2024  PCP: Clemens Curt, MD  Admit date: 01/28/2024 Discharge date: 02/05/2024  Admitted From: (Home) Disposition:  (SNF)  Recommendations for Outpatient Follow-up:  Follow up with PCP in 1-2 weeks Please obtain BMP/CBC in one week Please avoid Cymbalta  in the future   Diet recommendation: Heart Healthy, please limit free fluid intake to 1.8 L/day  Brief/Interim Summary:  Sarah Harper is a 83 y.o. female with past medical history significant for essential hypertension, generalized anxiety disorder, hypothyroidism, vitamin B12 and vitamin D  deficiency presented to the emergency department for evaluation for syncopal episode at home. Patient's husband reported that she has been seemed confused over the course of last 1 week corresponds with her taking a new blood pressure pill.  She was also recently started on Cymbalta .  No other new medications.  She has been acting differently, especially asking strange questions and going to bed earlier and staying in bed more often.  EMS was called.  In the ED patient was hypertensive.  Initial labs showed sodium of 121.    Urine sodium 81. Urinalysis with red cells.  CBC was unremarkable.  Troponin within normal range.  EKG showed normal sinus rhythm.  CT head without any acute findings.  Chest x-ray with low lung volumes.  In the ED patient was confused and restless and received Haldol .  Patient was then considered for admission to the hospital for further evaluation and treatment. - Patient was admitted for syncope, encephalopathic, workup significant for UTI, hyponatremia    Acute metabolic encephalopathy secondary to UTI and hyponatremia. Acute toxic encephalopathy secondary to Cymbalta  - at baseline, she is appropriate, awake alert x 3, she is significantly confused and altered on admission, has much improved, currently back at baseline.   - CT head with no  acute findings .  MRI brain with no acute findings. - This can be attributed to UTI, as well hyponatremia .  As well due to to Cymbalta  was initiated few days before admission. - Continue to hold Cymbalta  and avoid in the future - Edition back to baseline.   Syncopal went at home with recurrent event 6/14 during hospital stay - Likely in the setting of severe electrolyte derangements, hyponatremia and UTI . - 2D echo with EF 75%, continue to monitor on telemetry . -She is having some intermittent SVTs, but she was asymptomatic during these events. -Patient had episode of unresponsiveness late morning, blood pressure has been soft, not orthostatic, CBG within normal limit,, CT head with no acute finding, EEG significant for encephalopathic, but no seizures, currently at baseline   UTI, Enterococcus rine culture growing Enterococcus, manage treatment with IV ampicillin    Severe hypokalemia  - Replaced .   Hypomagnesemia  - Replaced    Hypophosphatemia -  replaced    Hypoosmolar hyponatremia history of previous hypoosmolar hyponatremia and SIADH. -Euvolemic .  She she was on fluid restrictions, and resume salt tablets, required some IV NS as well, sodium is 132 on discharge - Avoid Cymbalta     Hypothyroidism Continue levothyroxine .  TSH borderline high at 6.5.   Generalized anxiety disorder Was recently started on Cymbalta .  Will hold off for now in the context of hyponatremia and encephalopathy. -Does not require Ativan  during hospital stay, but she is on Ativan  at home as needed, so we will continue only as needed at bedtime.   Essential hypertension Continue amlodipine  metoprolol  and hydralazine .     History of  hip fracture status post nailing repair in January 2025 Continue physical therapy.  Fall precautions.   GERD Continue with PPI     Discharge Diagnoses:  Principal Problem:   Acute metabolic encephalopathy Active Problems:   Hypothyroidism   Hyperosmolar  hyponatremia   GAD (generalized anxiety disorder)   Essential hypertension   Acute cystitis   Vitamin D  deficiency   Metabolic encephalopathy    Discharge Instructions  Discharge Instructions     Diet - low sodium heart healthy   Complete by: As directed    Discharge instructions   Complete by: As directed    Follow with Primary MD Tower, Manley Seeds, MD /SNF physician  Get CBC, CMP,  checked  by Primary MD next visit.    Activity: As tolerated with Full fall precautions use walker/cane & assistance as needed  Disposition SNF   Diet: Heart Healthy  , 1.8 L /24 hr  fluid restrictions, with feeding assistance and aspiration precautions.  For Heart failure patients - Check your Weight same time everyday, if you gain over 2 pounds, or you develop in leg swelling, experience more shortness of breath or chest pain, call your Primary MD immediately. Follow Cardiac Low Salt Diet and 1.5 lit/day fluid restriction.   On your next visit with your primary care physician please Get Medicines reviewed and adjusted.   Please request your Prim.MD to go over all Hospital Tests and Procedure/Radiological results at the follow up, please get all Hospital records sent to your Prim MD by signing hospital release before you go home.   If you experience worsening of your admission symptoms, develop shortness of breath, life threatening emergency, suicidal or homicidal thoughts you must seek medical attention immediately by calling 911 or calling your MD immediately  if symptoms less severe.  You Must read complete instructions/literature along with all the possible adverse reactions/side effects for all the Medicines you take and that have been prescribed to you. Take any new Medicines after you have completely understood and accpet all the possible adverse reactions/side effects.   Do not drive, operating heavy machinery, perform activities at heights, swimming or participation in water activities or  provide baby sitting services if your were admitted for syncope or siezures until you have seen by Primary MD or a Neurologist and advised to do so again.  Do not drive when taking Pain medications.    Do not take more than prescribed Pain, Sleep and Anxiety Medications  Special Instructions: If you have smoked or chewed Tobacco  in the last 2 yrs please stop smoking, stop any regular Alcohol  and or any Recreational drug use.  Wear Seat belts while driving.   Please note  You were cared for by a hospitalist during your hospital stay. If you have any questions about your discharge medications or the care you received while you were in the hospital after you are discharged, you can call the unit and asked to speak with the hospitalist on call if the hospitalist that took care of you is not available. Once you are discharged, your primary care physician will handle any further medical issues. Please note that NO REFILLS for any discharge medications will be authorized once you are discharged, as it is imperative that you return to your primary care physician (or establish a relationship with a primary care physician if you do not have one) for your aftercare needs so that they can reassess your need for medications and monitor your lab values.  Increase activity slowly   Complete by: As directed       Allergies as of 02/05/2024       Reactions   Aspirin  Other (See Comments)   GI intolerance    Nsaids Other (See Comments)   GI discomfort and gastritis   Tolmetin Other (See Comments)   GI discomfort and gastritis        Medication List     STOP taking these medications    butalbital -acetaminophen -caffeine  50-325-40 MG tablet Commonly known as: Fioricet   DULoxetine  30 MG capsule Commonly known as: Cymbalta        TAKE these medications    acetaminophen  325 MG tablet Commonly known as: TYLENOL  Take 2 tablets (650 mg total) by mouth every 6 (six) hours as needed for mild  pain (pain score 1-3) or fever (or Fever >/= 101). What changed:  medication strength how much to take reasons to take this   amLODipine  5 MG tablet Commonly known as: NORVASC  Take 1/2 tablet (2.5 mg) by mouth daily   calcium  carbonate 500 MG chewable tablet Commonly known as: TUMS - dosed in mg elemental calcium  Chew 1 tablet by mouth daily.   cholecalciferol  25 MCG (1000 UNIT) tablet Commonly known as: VITAMIN D3 Take 1,000 Units by mouth daily.   clobetasol ointment 0.05 % Commonly known as: TEMOVATE Apply 1 Application topically daily.   Cranberry 250 MG Caps Take 1 capsule by mouth daily.   diclofenac  sodium 1 % Gel Commonly known as: VOLTAREN  APPLY 2 GRAMS TOPICALLY FOUR TIMES A DAY TO AFFECTED AREAS What changed: See the new instructions.   feeding supplement Liqd Take 237 mLs by mouth 3 (three) times daily between meals.   levothyroxine  75 MCG tablet Commonly known as: SYNTHROID  Take 1 tablet (75 mcg total) by mouth daily before breakfast.   LORazepam  0.5 MG tablet Commonly known as: ATIVAN  Take 1 tablet (0.5 mg total) by mouth daily as needed for anxiety. What changed: when to take this   metoprolol  tartrate 25 MG tablet Commonly known as: LOPRESSOR  Take 0.5 tablets (12.5 mg total) by mouth 2 (two) times daily. What changed: additional instructions   omeprazole  40 MG capsule Commonly known as: PRILOSEC Take 1 capsule (40 mg total) by mouth daily.   sodium chloride  1 g tablet Take 1 tablet (1 g total) by mouth 2 (two) times daily with a meal.   sucralfate  1 g tablet Commonly known as: CARAFATE  Take 1 g by mouth 4 (four) times daily as needed (Esophagitis).   SUPER B COMPLEX PO Take 1 tablet by mouth daily.               Durable Medical Equipment  (From admission, onward)           Start     Ordered   02/01/24 1558  For home use only DME Bedside commode  Once       Question:  Patient needs a bedside commode to treat with the  following condition  Answer:  Muscle weakness   02/01/24 1557            Contact information for after-discharge care     Destination     [name]   Service: Skilled Nursing Contact information: [address] [place]  [address] [phone]                    Allergies  Allergen Reactions   Aspirin  Other (See Comments)  GI intolerance    Nsaids Other (See Comments)    GI discomfort and gastritis   Tolmetin Other (See Comments)    GI discomfort and gastritis    Consultations: None   Procedures/Studies: CT HEAD WO CONTRAST ( ) Result Date: 02/02/2024 CLINICAL DATA:  Loss of consciousness. EXAM: CT HEAD WITHOUT CONTRAST TECHNIQUE: Contiguous axial images were obtained from the base of the skull through the vertex without intravenous contrast. RADIATION DOSE REDUCTION: This exam was performed according to the departmental dose-optimization program which includes automated exposure control, adjustment of the mA and/or kV according to patient size and/or use of iterative reconstruction technique. COMPARISON:  Head CT 01/28/2024 and MRI 01/31/2024 FINDINGS: Brain: There is no evidence of an acute infarct, intracranial hemorrhage, mass, midline shift, or extra-axial fluid collection. There is mild-to-moderate cerebral atrophy. Patchy cerebral white matter hypodensities are unchanged and nonspecific but compatible with mild-to-moderate chronic small vessel ischemic disease. Vascular: Calcified atherosclerosis at the skull base. No hyperdense vessel. Skull: No fracture or suspicious lesion. Sinuses/Orbits: Visualized paranasal sinuses and mastoid air cells are clear. Bilateral cataract extraction. Other: None. IMPRESSION: 1. No evidence of acute intracranial abnormality. 2. Mild-to-moderate chronic small vessel ischemic disease. Electronically Signed   By: Aundra Lee M.D.   On: 02/02/2024 12:05   MR BRAIN WO CONTRAST Result Date:  01/31/2024 EXAM DESCRIPTION: MR BRAIN WO CONTRAST CLINICAL HISTORY: AMS COMPARISON: None available TECHNIQUE: Non contrast MRI of the brain is performed according to our usual protocol including multiplanar multi sequence technique. FINDINGS: No acute intracranial hemorrhage, mass, edema, or hydrocephalus. Mild cortical atrophy. No encephalomalacia. Mild to moderate white matter disease suggesting chronic small vessel ischemic change. The vascular flow voids are unremarkable. No significant sinus disease. IMPRESSION: Age-related change without acute intracranial pathology. Electronically signed by: Basilio Both MD 01/31/2024 08:41 PM EDT RP Workstation: [id number]   ECHOCARDIOGRAM COMPLETE Result Date: 01/29/2024    ECHOCARDIOGRAM REPORT   Patient Name:   Sarah Harper Date of Exam: 01/29/2024 Medical Rec #:  [id number]       Height:       64.0 in Accession #:    [id number]      Weight:       126.4 lb Date of Birth:  1940-08-28      BSA:          1.610 m Patient Age:    82 years        BP:           120/60 mmHg Patient Gender: F               HR:           59 bpm. Exam Location:  Inpatient Procedure: 2D Echo, Cardiac Doppler and Color Doppler (Both Spectral and Color            Flow Doppler were utilized during procedure). Indications:    R55 Syncope  History:        Patient has prior history of Echocardiogram examinations, most                 recent 11/09/2020.  Sonographer:    Andrena Bang Referring Phys: LAXMAN POKHREL IMPRESSIONS  1. Left ventricular ejection fraction, by estimation, is 70 to 75%. The left ventricle has hyperdynamic function. The left ventricle has no regional wall motion abnormalities. There is severe left ventricular hypertrophy of the inferior segment. Left ventricular diastolic parameters are consistent with Grade I diastolic dysfunction (impaired relaxation).  2. Right ventricular systolic  function is normal. The right ventricular size is normal.  3. A small pericardial effusion is  present. The pericardial effusion is circumferential. There is no evidence of cardiac tamponade.  4. The mitral valve is normal in structure. Mild mitral valve regurgitation. No evidence of mitral stenosis.  5. Tricuspid valve regurgitation is mild to moderate.  6. The aortic valve is tricuspid. Aortic valve regurgitation is not visualized. No aortic stenosis is present.  7. The inferior vena cava is normal in size with greater than 50% respiratory variability, suggesting right atrial pressure of 3 mmHg. Comparison(s): No significant change from prior study. Prior images reviewed side by side. Prior ventricular function was more robust than reported. FINDINGS  Left Ventricle: Left ventricular ejection fraction, by estimation, is 70 to 75%. The left ventricle has hyperdynamic function. The left ventricle has no regional wall motion abnormalities. The left ventricular internal cavity size was normal in size. There is severe left ventricular hypertrophy of the inferior segment. Left ventricular diastolic parameters are consistent with Grade I diastolic dysfunction (impaired relaxation). Right Ventricle: The right ventricular size is normal. No increase in right ventricular wall thickness. Right ventricular systolic function is normal. Left Atrium: Left atrial size was normal in size. Right Atrium: Right atrial size was normal in size. Pericardium: A small pericardial effusion is present. The pericardial effusion is circumferential. There is no evidence of cardiac tamponade. Mitral Valve: The mitral valve is normal in structure. Mild mitral valve regurgitation. No evidence of mitral valve stenosis. Tricuspid Valve: The tricuspid valve is normal in structure. Tricuspid valve regurgitation is mild to moderate. No evidence of tricuspid stenosis. Aortic Valve: The aortic valve is tricuspid. Aortic valve regurgitation is not visualized. No aortic stenosis is present. Aortic valve mean gradient measures 4.0 mmHg. Aortic valve  peak gradient measures 7.4 mmHg. Aortic valve area, by VTI measures 2.95 cm. Pulmonic Valve: The pulmonic valve was normal in structure. Pulmonic valve regurgitation is mild to moderate. No evidence of pulmonic stenosis. Aorta: The aortic root and ascending aorta are structurally normal, with no evidence of dilitation. Pulmonary Artery: The pulmonary artery is of normal size. Venous: The inferior vena cava is normal in size with greater than 50% respiratory variability, suggesting right atrial pressure of 3 mmHg. IAS/Shunts: The atrial septum is grossly normal.  LEFT VENTRICLE PLAX 2D LVIDd:         3.70 cm     Diastology LVIDs:         2.60 cm     LV e' medial:    6.31 cm/s LV PW:         1.60 cm     LV E/e' medial:  12.9 LV IVS:        0.90 cm     LV e' lateral:   6.20 cm/s LVOT diam:     1.90 cm     LV E/e' lateral: 13.1 LV SV:         91 LV SV Index:   57 LVOT Area:     2.84 cm  LV Volumes (MOD) LV vol d, MOD A2C: 55.6 ml LV vol d, MOD A4C: 77.2 ml LV vol s, MOD A2C: 15.3 ml LV vol s, MOD A4C: 19.1 ml LV SV MOD A2C:     40.3 ml LV SV MOD A4C:     77.2 ml LV SV MOD BP:      49.3 ml RIGHT VENTRICLE RV S prime:     10.80 cm/s TAPSE (M-mode): 2.3 cm  LEFT ATRIUM           Index LA diam:      2.70 cm 1.68 cm/m LA Vol (A2C): 26.5 ml 16.46 ml/m LA Vol (A4C): 34.7 ml 21.56 ml/m  AORTIC VALVE AV Area (Vmax):    2.69 cm AV Area (Vmean):   2.49 cm AV Area (VTI):     2.95 cm AV Vmax:           136.00 cm/s AV Vmean:          89.100 cm/s AV VTI:            0.309 m AV Peak Grad:      7.4 mmHg AV Mean Grad:      4.0 mmHg LVOT Vmax:         129.00 cm/s LVOT Vmean:        78.400 cm/s LVOT VTI:          0.322 m LVOT/AV VTI ratio: 1.04  AORTA Ao Asc diam: 3.60 cm MITRAL VALVE MV Area (PHT): 2.60 cm    SHUNTS MV Decel Time: 292 msec    Systemic VTI:  0.32 m MV E velocity: 81.20 cm/s  Systemic Diam: 1.90 cm MV A velocity: 89.10 cm/s MV E/A ratio:  0.91 Gloriann Larger MD Electronically signed by Gloriann Larger MD  Signature Date/Time: 01/29/2024/3:52:05 PM    Final    CT Head Wo Contrast Result Date: 01/28/2024 CLINICAL DATA:  Mental status change, unknown cause EXAM: CT HEAD WITHOUT CONTRAST TECHNIQUE: Contiguous axial images were obtained from the base of the skull through the vertex without intravenous contrast. RADIATION DOSE REDUCTION: This exam was performed according to the departmental dose-optimization program which includes automated exposure control, adjustment of the mA and/or kV according to patient size and/or use of iterative reconstruction technique. COMPARISON:  CT head 11/15/2020 FINDINGS: Brain: Patchy and confluent areas of decreased attenuation are noted throughout the deep and periventricular white matter of the cerebral hemispheres bilaterally, compatible with chronic microvascular ischemic disease. No evidence of large-territorial acute infarction. No parenchymal hemorrhage. No mass lesion. No extra-axial collection. No mass effect or midline shift. No hydrocephalus. Basilar cisterns are patent. Vascular: No hyperdense vessel. Atherosclerotic calcifications are present within the cavernous internal carotid arteries. Skull: No acute fracture or focal lesion. Sinuses/Orbits: Paranasal sinuses and mastoid air cells are clear. Bilateral lens replacement. Otherwise the orbits are unremarkable. Other: None. IMPRESSION: No acute intracranial abnormality. Electronically Signed   By: Morgane  Naveau M.D.   On: 01/28/2024 15:51   DG Chest Portable 1 View Result Date: 01/28/2024 CLINICAL DATA:  Syncope. EXAM: PORTABLE CHEST 1 VIEW COMPARISON:  September 05, 2023 FINDINGS: The heart size and mediastinal contours are within normal limits. Low lung volumes are noted. There is no evidence of acute infiltrate, pleural effusion or pneumothorax. Multilevel degenerative changes seen throughout the thoracic spine. IMPRESSION: Low lung volumes without evidence of acute or active cardiopulmonary disease. Electronically  Signed   By: Virgle Grime M.D.   On: 01/28/2024 14:08   CT PELVIS WO CONTRAST Result Date: 01/15/2024 CLINICAL DATA:  Right SI joint pain. Prior right hip intertrochanteric fracture ORIF in January 2025. EXAM: CT PELVIS WITHOUT CONTRAST TECHNIQUE: Multidetector CT imaging of the pelvis was performed following the standard protocol without intravenous contrast. RADIATION DOSE REDUCTION: This exam was performed according to the departmental dose-optimization program which includes automated exposure control, adjustment of the mA and/or kV according to patient size and/or use of iterative reconstruction technique. COMPARISON:  Multiple  exams, including radiographs 09/05/2023 FINDINGS: OSSEOUS STRUCTURES AND JOINTS: Lower lumbar degenerative facet arthropathy. No SI joint diastasis. A small amount of nitrogen gas phenomenon in both SI joints without SI joint effusion observed. No erosions or other specific abnormalities along the SI joints aside from mild spurring. No pelvic fracture observed. Right femoral nail system in place with single distal interlocking screw and femoral head screw noted. This traverses the intertrochanteric fracture which is still visible, with notable lucency along part of the medial fracture margin on image 52 series 5 and image 99 series 2 probably from hyperemic resorption of bone in this local vicinity. There is potentially some early bridging of bone anteriorly at the fracture site. Early cortication along the posterior portion of the fracture may suggest early nonunion of the posterior portion for example as on image 62 series 5. No abnormal lucency around the components of the nail system to indicate loosening or infection. MUSCULOTENDINOUS: Unremarkable SACRAL PLEXUS: No impinging lesion along the sacral plexus or proximal sciatic nerves. OTHER: Prominent stool throughout the colon favors constipation. Mild degenerative arthropathy of the left (contralateral) hip. IMPRESSION: 1.  No specific abnormality is identified to explain the patient's right SI joint pain. 2. Right femoral nail system in place traversing the intertrochanteric fracture. Questionable partial early nonunion of the posterior portion of the fracture anterior bridging is appreciated. There is some accentuated lucency along the medial portion of the fracture probably from hyperemic resorption of bone, but no abnormal extension of lucency along components of the IM nail or femoral head nail to suggest loosening/infection. 3. Prominent stool throughout the colon favors constipation. 4. Mild degenerative arthropathy of the left (contralateral) hip. 5. Lower lumbar degenerative facet arthropathy. Electronically Signed   By: Freida Jes M.D.   On: 01/15/2024 16:54   (Echo, Carotid, EGD, Colonoscopy, ERCP)    Subjective:  She denies any complaints, good night sleep, appetite has improved, as discussed with husband at bedside, she appears back at baseline. Discharge Exam: Vitals:   02/05/24 0305 02/05/24 0821  BP: (!) 146/65 133/68  Pulse: 90 (!) 101  Resp: 17   Temp: 97.6 F (36.4 C)   SpO2: 97%    Vitals:   02/04/24 2108 02/04/24 2327 02/05/24 0305 02/05/24 0821  BP: (!) 171/77 (!) 160/80 (!) 146/65 133/68  Pulse: (!) 109 100 90 (!) 101  Resp:  20 17   Temp:  97.8 F (36.6 C) 97.6 F (36.4 C)   TempSrc:  Oral Oral   SpO2:  93% 97%     General: Pt is alert, awake, not in acute distress Cardiovascular: RRR, S1/S2 +, no rubs, no gallops Respiratory: CTA bilaterally, no wheezing, no rhonchi Abdominal: Soft, NT, ND, bowel sounds + Extremities: no edema, no cyanosis    The results of significant diagnostics from this hospitalization (including imaging, microbiology, ancillary and laboratory) are listed below for reference.     Microbiology: Recent Results (from the past 240 hours)  Urine Culture     Status: Abnormal   Collection Time: 01/28/24  8:22 PM   Specimen: Urine, Clean Catch   Result Value Ref Range Status   Specimen Description URINE, CLEAN CATCH  Final   Special Requests   Final    NONE Performed at [facility], [address], [place], [place] [address]    Culture >=100,000 COLONIES/mL ENTEROCOCCUS FAECALIS (A)  Final   Report Status 01/30/2024 FINAL  Final   Organism ID, Bacteria ENTEROCOCCUS FAECALIS (A)  Final  Susceptibility   Enterococcus faecalis - MIC*    AMPICILLIN  <=2 SENSITIVE Sensitive     NITROFURANTOIN <=16 SENSITIVE Sensitive     VANCOMYCIN 1 SENSITIVE Sensitive     * >=100,000 COLONIES/mL ENTEROCOCCUS FAECALIS     Labs: BNP (last 3 results) No results for input(s): BNP in the last 8760 hours. Basic Metabolic Panel: Recent Labs  Lab 01/30/24 0603 01/30/24 1458 01/31/24 0741 02/01/24 0703 02/02/24 0727 02/03/24 0736 02/04/24 0548 02/05/24 0500  NA 123*   < > 125* 125* 125* 129* 132* 132*  K 3.8   < > 3.5 4.4 3.7 3.8 4.2 4.0  CL 88*   < > 88* 91* 88* 93* 97* 98  CO2 24   < > 25 23 24 24 28 25   GLUCOSE 142*   < > 115* 98 112* 122* 92 99  BUN 7*   < > 9 13 9 16 19 17   CREATININE 0.57   < > 0.58 0.54 0.54 0.65 0.74 0.56  CALCIUM  9.4   < > 9.1 8.8* 9.5 9.0 8.9 9.1  MG 1.5*  --  1.9 1.8 1.8  --   --   --   PHOS 2.1*  --  2.5 2.4* 3.3  --   --   --    < > = values in this interval not displayed.   Liver Function Tests: No results for input(s): AST, ALT, ALKPHOS, BILITOT, PROT, ALBUMIN in the last 168 hours. No results for input(s): LIPASE, AMYLASE in the last 168 hours. No results for input(s): AMMONIA in the last 168 hours. CBC: Recent Labs  Lab 02/01/24 0703 02/02/24 0727 02/03/24 0736 02/04/24 0548 02/05/24 0500  WBC 7.4 8.2 7.9 8.4 8.6  HGB 13.0 14.5 13.3 12.9 12.2  HCT 38.3 42.2 39.3 38.0 36.8  MCV 84.7 84.4 86.8 87.8 88.2  PLT 291 340 306 306 316   Cardiac Enzymes: No results for input(s): CKTOTAL, CKMB, CKMBINDEX, TROPONINI in the last 168 hours. BNP: Invalid input(s):  POCBNP CBG: Recent Labs  Lab 02/02/24 1106  GLUCAP 145*   D-Dimer No results for input(s): DDIMER in the last 72 hours. Hgb A1c No results for input(s): HGBA1C in the last 72 hours. Lipid Profile No results for input(s): CHOL, HDL, LDLCALC, TRIG, CHOLHDL, LDLDIRECT in the last 72 hours. Thyroid  function studies No results for input(s): TSH, T4TOTAL, T3FREE, THYROIDAB in the last 72 hours.  Invalid input(s): FREET3 Anemia work up No results for input(s): VITAMINB12, FOLATE, FERRITIN, TIBC, IRON, RETICCTPCT in the last 72 hours. Urinalysis    Component Value Date/Time   COLORURINE YELLOW 01/28/2024 1447   APPEARANCEUR CLEAR 01/28/2024 1447   LABSPEC 1.008 01/28/2024 1447   PHURINE 6.0 01/28/2024 1447   GLUCOSEU 50 (A) 01/28/2024 1447   HGBUR NEGATIVE 01/28/2024 1447   HGBUR negative 10/06/2008 1447   BILIRUBINUR NEGATIVE 01/28/2024 1447   BILIRUBINUR Negative 09/03/2023 0948   KETONESUR 5 (A) 01/28/2024 1447   PROTEINUR NEGATIVE 01/28/2024 1447   UROBILINOGEN 0.2 09/03/2023 0948   UROBILINOGEN 0.2 10/06/2008 1447   NITRITE NEGATIVE 01/28/2024 1447   LEUKOCYTESUR MODERATE (A) 01/28/2024 1447   Sepsis Labs Recent Labs  Lab 02/02/24 0727 02/03/24 0736 02/04/24 0548 02/05/24 0500  WBC 8.2 7.9 8.4 8.6   Microbiology Recent Results (from the past 240 hours)  Urine Culture     Status: Abnormal   Collection Time: 01/28/24  8:22 PM   Specimen: Urine, Clean Catch  Result Value Ref Range Status   Specimen Description  URINE, CLEAN CATCH  Final   Special Requests   Final    NONE Performed at [facility], [address], [place], [place] [address]    Culture >=100,000 COLONIES/mL ENTEROCOCCUS FAECALIS (A)  Final   Report Status 01/30/2024 FINAL  Final   Organism ID, Bacteria ENTEROCOCCUS FAECALIS (A)  Final      Susceptibility   Enterococcus faecalis - MIC*    AMPICILLIN  <=2 SENSITIVE Sensitive     NITROFURANTOIN <=16  SENSITIVE Sensitive     VANCOMYCIN 1 SENSITIVE Sensitive     * >=100,000 COLONIES/mL ENTEROCOCCUS FAECALIS     Time coordinating discharge: Over 30 minutes  SIGNED:   Seena Dadds, MD  [facility] 02/05/2024, 10:46 AM Pager   If 7PM-7AM, please contact night-coverage [email or web address] Password [email or web address]

## 2024-02-05 NOTE — Progress Notes (Signed)
 Physical Therapy Treatment Patient Details Name: Sarah Harper MRN: [id number] DOB: November 20, 1940 Today's Date: 02/05/2024   History of Present Illness Patient is an 83 y.o. female presented to the ED on 01/29/24 for evaluation for syncopal episode at home. Pt's spouse reports increased confusion in week PTA. Pt hypertensive in ED. PT admitted for encephalopathy suspected due to UTI. PMH significant for HTN, GERD, osteopenia, CVA, syncopal episodes, TIA, DDD, anxiety,  R hip fx s/p IMN 09/06/23.    PT Comments  Tolerated well, making notable progress quickly. Still with mild instability while ambulating and reduced awareness/memory. Transferred with min assist from bed. Patient will continue to benefit from skilled physical therapy services to further improve independence with functional mobility.     If plan is discharge home, recommend the following: A little help with walking and/or transfers;A little help with bathing/dressing/bathroom;Assistance with cooking/housework;Direct supervision/assist for medications management;Assist for transportation;Help with stairs or ramp for entrance;Supervision due to cognitive status   Can travel by private vehicle     Yes  Equipment Recommendations  BSC/3in1    Recommendations for Other Services       Precautions / Restrictions Precautions Precautions: Fall Recall of Precautions/Restrictions: Impaired Restrictions Weight Bearing Restrictions Per Provider Order: No     Mobility  Bed Mobility Overal bed mobility: Needs Assistance Bed Mobility: Supine to Sit     Supine to sit: Contact guard, HOB elevated, Used rails     General bed mobility comments: CGA for safety to rise, slow effortful, cues to sequence to EOB.    Transfers Overall transfer level: Needs assistance Equipment used: Rolling walker (2 wheels) Transfers: Sit to/from Stand Sit to Stand: Min assist           General transfer comment: Min assist for boost to stand  from edge of bed with cues for technique and hand placement. CGA from toilet with rail use.    Ambulation/Gait Ambulation/Gait assistance: Contact guard assist Gait Distance (Feet): 95 Feet Assistive device: Rolling walker (2 wheels) Gait Pattern/deviations: Step-through pattern, Decreased step length - right, Decreased step length - left, Decreased stride length, Trunk flexed Gait velocity: decr Gait velocity interpretation: <1.31 ft/sec, indicative of household ambulator   General Gait Details: Better foot clearance today. Cues for upright posture, larger step length and awareness due to bumping into items a couple of times. CGA for safety. VSS.   Stairs             Wheelchair Mobility     Tilt Bed    Modified Rankin (Stroke Patients Only)       Balance Overall balance assessment: Needs assistance Sitting-balance support: Feet supported Sitting balance-Leahy Scale: Fair Sitting balance - Comments: supervision on EOB   Standing balance support: During functional activity, Single extremity supported Standing balance-Leahy Scale: Poor Standing balance comment: able to stand with unilateral support perform peri-care.                            Communication Communication Communication: No apparent difficulties  Cognition Arousal: Alert Behavior During Therapy: WFL for tasks assessed/performed   PT - Cognitive impairments: Awareness, Sequencing, Problem solving, Safety/Judgement, Memory                       PT - Cognition Comments: Repeated herself a couple times. Following commands: Intact Following commands impaired: Only follows one step commands consistently    Cueing Cueing Techniques: Verbal cues  Exercises  General Exercises - Lower Extremity Ankle Circles/Pumps: AROM, Both, 10 reps, Seated Quad Sets: Strengthening, Both, 10 reps, Seated Gluteal Sets: Strengthening, Both, 10 reps, Seated    General Comments General comments (skin  integrity, edema, etc.): VSS      Pertinent Vitals/Pain Pain Assessment Pain Assessment: No/denies pain    Home Living                          Prior Function            PT Goals (current goals can now be found in the care plan section) Acute Rehab PT Goals Patient Stated Goal: get better and home PT Goal Formulation: With patient/family Time For Goal Achievement: 02/13/24 Potential to Achieve Goals: Good Progress towards PT goals: Progressing toward goals    Frequency    Min 2X/week      PT Plan      Co-evaluation              AM-PAC PT 6 Clicks Mobility   Outcome Measure  Help needed turning from your back to your side while in a flat bed without using bedrails?: A Little Help needed moving from lying on your back to sitting on the side of a flat bed without using bedrails?: A Little Help needed moving to and from a bed to a chair (including a wheelchair)?: A Little Help needed standing up from a chair using your arms (e.g., wheelchair or bedside chair)?: A Little Help needed to walk in hospital room?: A Little Help needed climbing 3-5 steps with a railing? : A Lot 6 Click Score: 17    End of Session Equipment Utilized During Treatment: Gait belt Activity Tolerance: Patient tolerated treatment well Patient left: with call bell/phone within reach;with family/visitor present;in chair;with chair alarm set   PT Visit Diagnosis: Other abnormalities of gait and mobility (R26.89);Muscle weakness (generalized) (M62.81);History of falling (Z91.81);Difficulty in walking, not elsewhere classified (R26.2);Other symptoms and signs involving the nervous system (R29.898)     Time: 4098-1191 PT Time Calculation (min) (ACUTE ONLY): 23 min  Charges:    $Gait Training: 8-22 mins $Therapeutic Activity: 8-22 mins PT General Charges $$ ACUTE PT VISIT: 1 Visit                     Jory Ng, PT, DPT [facility]  Rehabilitation Services Physical  Therapist Office: [phone] Website: [email or web address]    Alinda Irani 02/05/2024, 10:45 AM

## 2024-02-05 NOTE — TOC Transition Note (Signed)
 Transition of Care [facility]) - Discharge Note   Patient Details  Name: Sarah Harper MRN: [id number] Date of Birth: 1941/08/13  [facility]) CM/SW Contact:  Jannice Mends, LCSW Phone Number: 02/05/2024, 2:45 PM   Clinical Narrative:    Patient will DC to: [facility] [place] Anticipated DC date: 02/05/24 Family notified: Spouse Transport by: Lyna Sandhoff   Per MD patient ready for DC to . RN to call report prior to discharge [phone] room 710). RN, patient, patient's family, and facility notified of DC. Discharge Summary and FL2 sent to facility. DC packet on chart including signed script. Ambulance transport requested for patient.   CSW will sign off for now as social work intervention is no longer needed. Please consult us  again if new needs arise.     Final next level of care: Skilled Nursing Facility Barriers to Discharge: Barriers Resolved   Patient Goals and CMS Choice Patient states their goals for this hospitalization and ongoing recovery are:: Return home CMS Medicare.gov Compare Post Acute Care list provided to:: Patient Represenative (must comment) Choice offered to / list presented to : Spouse [place] ownership interest in [facility].provided to:: Spouse    Discharge Placement   Existing PASRR number confirmed : 02/05/24          Patient chooses bed at: [facility] [place] Patient to be transferred to facility by: [facility] Name of family member notified: Spouse Patient and family notified of of transfer: 02/05/24  Discharge Plan and Services Additional resources added to the After Visit Summary for   In-house Referral: Clinical Social Work Discharge Planning Services: CM Consult Post Acute Care Choice: Home Health, Durable Medical Equipment                               Social Drivers of Health (SDOH) Interventions SDOH Screenings   Food Insecurity: No Food Insecurity (01/29/2024)  Housing: Low Risk  (01/29/2024)   Transportation Needs: No Transportation Needs (01/29/2024)  Utilities: Not At Risk (01/29/2024)  Alcohol Screen: Low Risk  (12/26/2023)  Depression (PHQ2-9): High Risk (01/21/2024)  Financial Resource Strain: Low Risk  (12/26/2023)  Physical Activity: Insufficiently Active (12/26/2023)  Social Connections: Moderately Integrated (01/29/2024)  Stress: No Stress Concern Present (12/26/2023)  Tobacco Use: Low Risk  (01/29/2024)  Health Literacy: Adequate Health Literacy (12/26/2023)     Readmission Risk Interventions     No data to display

## 2024-02-05 NOTE — Plan of Care (Signed)
  Problem: Clinical Measurements: Goal: Ability to maintain clinical measurements within normal limits will improve Outcome: Progressing   Problem: Activity: Goal: Risk for activity intolerance will decrease Outcome: Progressing   Problem: Coping: Goal: Level of anxiety will decrease Outcome: Progressing   Problem: Safety: Goal: Ability to remain free from injury will improve Outcome: Progressing   

## 2024-02-07 ENCOUNTER — Other Ambulatory Visit: Payer: Self-pay | Admitting: Family Medicine

## 2024-02-07 ENCOUNTER — Ambulatory Visit

## 2024-02-11 ENCOUNTER — Ambulatory Visit: Admitting: Family Medicine

## 2024-02-12 ENCOUNTER — Ambulatory Visit

## 2024-02-13 ENCOUNTER — Telehealth: Payer: Self-pay

## 2024-02-13 NOTE — Transitions of Care (Post Inpatient/ED Visit) (Signed)
 02/13/2024  Name: Sarah Harper MRN: [id number] DOB: 29-Mar-1941  Today's TOC FU Call Status: Today's TOC FU Call Status:: Successful TOC FU Call Completed TOC FU Call Complete Date: 02/13/24 Patient's Name and Date of Birth confirmed.  Transition Care Management Follow-up Telephone Call Date of Discharge: 02/12/24 Discharge Facility: Other [facility]) Name of Other (Non[facility]) Discharge Facility: [facility] Type of Discharge: Inpatient Admission Primary Inpatient Discharge Diagnosis:: fracture femur How have you been since you were released from the hospital?: Better Any questions or concerns?: No  Items Reviewed: Did you receive and understand the discharge instructions provided?: Yes Medications obtained,verified, and reconciled?: Yes (Medications Reviewed) Any new allergies since your discharge?: No Dietary orders reviewed?: Yes Do you have support at home?: Yes People in Home [RPT]: spouse  Medications Reviewed Today: Medications Reviewed Today     Reviewed by Emmitt Pan, LPN (Licensed Practical Nurse) on 02/13/24 at 1137  Med List Status: <None>   Medication Order Taking? Sig Documenting Provider Last Dose Status Informant  acetaminophen  (TYLENOL ) 325 MG tablet [id number] Yes Take 2 tablets (650 mg total) by mouth every 6 (six) hours as needed for mild pain (pain score 1-3) or fever (or Fever >/= 101). Elgergawy, Brayton RAMAN, MD  Active   amLODipine  (NORVASC ) 5 MG tablet [id number] Yes Take 1/2 tablet (2.5 mg) by mouth daily Tower, Laine LABOR, MD  Active Spouse/Significant Other, Pharmacy Records  B Complex-C (SUPER B COMPLEX PO) [id number] Yes Take 1 tablet by mouth daily. [provider]  Active Pharmacy Records, Spouse/Significant Other  calcium  carbonate (TUMS - DOSED IN MG ELEMENTAL CALCIUM ) 500 MG chewable tablet [id number] Yes Chew 1 tablet by mouth daily. [provider]  Active Spouse/Significant Other, Pharmacy Records  cholecalciferol   (VITAMIN D ) 25 MCG (1000 UNIT) tablet [id number] Yes Take 1,000 Units by mouth daily. [provider]  Active Pharmacy Records, Spouse/Significant Other  clobetasol ointment (TEMOVATE) 0.05 % [id number] Yes Apply 1 Application topically daily. [provider]  Active Spouse/Significant Other, Pharmacy Records  Cranberry 250 MG CAPS [id number] Yes Take 1 capsule by mouth daily. [provider]  Active Spouse/Significant Other, Pharmacy Records  diclofenac  sodium (VOLTAREN ) 1 % GEL [id number] Yes APPLY 2 GRAMS TOPICALLY FOUR TIMES A DAY TO AFFECTED AREAS  Patient taking differently: Apply 2 g topically 2 (two) times daily.   Tower, Laine LABOR, MD  Active Pharmacy Records, Spouse/Significant Other  feeding supplement (ENSURE PLUS HIGH PROTEIN) LIQD [id number] Yes Take 237 mLs by mouth 3 (three) times daily between meals. Elgergawy, Brayton RAMAN, MD  Active   levothyroxine  (SYNTHROID ) 75 MCG tablet [id number] Yes TAKE 1 TABLET BY MOUTH DAILY BEFORE BREAKFAST. Tower, Laine LABOR, MD  Active   LORazepam  (ATIVAN ) 0.5 MG tablet [id number] Yes Take 1 tablet (0.5 mg total) by mouth daily as needed for anxiety. Elgergawy, Brayton RAMAN, MD  Active   metoprolol  tartrate (LOPRESSOR ) 25 MG tablet [id number] Yes Take 0.5 tablets (12.5 mg total) by mouth 2 (two) times daily.  Patient taking differently: Take 12.5 mg by mouth 2 (two) times daily. May increase to 25mg  twice daily if hypertension is not controlled   Tower, Laine LABOR, MD  Active Spouse/Significant Other, Pharmacy Records  omeprazole  (PRILOSEC) 40 MG capsule [id number] Yes TAKE 1 CAPSULE (40 MG TOTAL) BY MOUTH DAILY. Tower, Laine LABOR, MD  Active   sodium chloride  1 g tablet [id number] Yes Take 1 tablet (1 g total) by mouth 2 (two) times daily with a meal. Rojelio Nest, DO  Active  Spouse/Significant Other, Pharmacy Records  sucralfate  (CARAFATE ) 1 g tablet [id number] Yes Take 1 g by mouth 4 (four) times daily as needed (Esophagitis). [provider]   Active Pharmacy Records, Spouse/Significant Other  Med List Note Marisa Nathanel LOISE Bishop 01/28/24 2112): Last name is: [facility] and Equipment/Supplies: Were Home Health Services Ordered?: NA Any new equipment or medical supplies ordered?: NA  Functional Questionnaire: Do you need assistance with bathing/showering or dressing?: Yes Do you need assistance with meal preparation?: No Do you need assistance with eating?: No Do you have difficulty maintaining continence: No Do you need assistance with getting out of bed/getting out of a chair/moving?: Yes Do you have difficulty managing or taking your medications?: No  Follow up appointments reviewed: PCP Follow-up appointment confirmed?: Yes Date of PCP follow-up appointment?: 02/21/24 Follow-up Provider: [facility] Follow-up appointment confirmed?: Yes Date of Specialist follow-up appointment?: 02/20/24 Follow-Up Specialty Provider:: surgeon Do you need transportation to your follow-up appointment?: No Do you understand care options if your condition(s) worsen?: Yes-patient verbalized understanding    SIGNATURE Julian Lemmings, LPN [facility] Nurse Health Advisor Direct Dial [phone]

## 2024-02-14 ENCOUNTER — Ambulatory Visit

## 2024-02-19 ENCOUNTER — Ambulatory Visit

## 2024-02-21 ENCOUNTER — Ambulatory Visit

## 2024-02-21 ENCOUNTER — Ambulatory Visit: Payer: Self-pay | Admitting: Family Medicine

## 2024-02-21 ENCOUNTER — Encounter: Payer: Self-pay | Admitting: Family Medicine

## 2024-02-21 ENCOUNTER — Ambulatory Visit: Admitting: Family Medicine

## 2024-02-21 VITALS — BP 138/84 | HR 61 | Temp 98.5°F | Ht 64.0 in | Wt 123.1 lb

## 2024-02-21 DIAGNOSIS — N3000 Acute cystitis without hematuria: Secondary | ICD-10-CM

## 2024-02-21 DIAGNOSIS — E870 Hyperosmolality and hypernatremia: Secondary | ICD-10-CM | POA: Diagnosis not present

## 2024-02-21 DIAGNOSIS — I10 Essential (primary) hypertension: Secondary | ICD-10-CM | POA: Diagnosis not present

## 2024-02-21 DIAGNOSIS — G9341 Metabolic encephalopathy: Secondary | ICD-10-CM

## 2024-02-21 DIAGNOSIS — E871 Hypo-osmolality and hyponatremia: Secondary | ICD-10-CM

## 2024-02-21 DIAGNOSIS — F411 Generalized anxiety disorder: Secondary | ICD-10-CM

## 2024-02-21 DIAGNOSIS — E039 Hypothyroidism, unspecified: Secondary | ICD-10-CM | POA: Diagnosis not present

## 2024-02-21 LAB — BASIC METABOLIC PANEL WITH GFR
BUN: 17 mg/dL (ref 6–23)
CO2: 32 meq/L (ref 19–32)
Calcium: 9.3 mg/dL (ref 8.4–10.5)
Chloride: 99 meq/L (ref 96–112)
Creatinine, Ser: 0.64 mg/dL (ref 0.40–1.20)
GFR: 82.14 mL/min (ref >60.00)
Glucose, Bld: 91 mg/dL (ref 70–99)
Potassium: 3.9 meq/L (ref 3.5–5.1)
Sodium: 137 meq/L (ref 135–145)

## 2024-02-21 LAB — TSH: TSH: 1.52 u[IU]/mL (ref 0.35–5.50)

## 2024-02-21 NOTE — Progress Notes (Signed)
 Subjective:    Patient ID: Sarah Harper, female    DOB: 09/23/1940, 83 y.o.   MRN: [id number]  HPI  Wt Readings from Last 3 Encounters:  02/21/24 123 lb 2 oz (55.8 kg)  01/21/24 126 lb 6 oz (57.3 kg)  12/26/23 128 lb (58.1 kg)   21.13 kg/m  Vitals:   02/21/24 1201  BP: 138/84  Pulse: 61  Temp: 98.5 F (36.9 C)  SpO2: 98%    Pt presents for follow up of hospitalization from 6/9 to 6/17 for uti , enceophalopathy, hyponatremia , syncopal event  Reaction to cymbalta    Per discharge summary     Acute metabolic encephalopathy secondary to UTI and hyponatremia. Acute toxic encephalopathy secondary to Cymbalta  - at baseline, she is appropriate, awake alert x 3, she is significantly confused and altered on admission, has much improved, currently back at baseline.   - CT head with no acute findings .  MRI brain with no acute findings. - This can be attributed to UTI, as well hyponatremia .  As well due to to Cymbalta  was initiated few days before admission. - Continue to hold Cymbalta  and avoid in the future - Edition back to baseline.   Syncopal went at home with recurrent event 6/14 during hospital stay - Likely in the setting of severe electrolyte derangements, hyponatremia and UTI . - 2D echo with EF 75%, continue to monitor on telemetry . -She is having some intermittent SVTs, but she was asymptomatic during these events. -Patient had episode of unresponsiveness late morning, blood pressure has been soft, not orthostatic, CBG within normal limit,, CT head with no acute finding, EEG significant for encephalopathic, but no seizures, currently at baseline   UTI, Enterococcus rine culture growing Enterococcus, manage treatment with IV ampicillin    Severe hypokalemia  - Replaced .   Hypomagnesemia  - Replaced    Hypophosphatemia -  replaced    Hypoosmolar hyponatremia history of previous hypoosmolar hyponatremia and SIADH. -Euvolemic .  She she was on fluid  restrictions, and resume salt tablets, required some IV NS as well, sodium is 132 on discharge - Avoid Cymbalta     Hypothyroidism Continue levothyroxine .  TSH borderline high at 6.5.   Generalized anxiety disorder Was recently started on Cymbalta .  Will hold off for now in the context of hyponatremia and encephalopathy. -Does not require Ativan  during hospital stay, but she is on Ativan  at home as needed, so we will continue only as needed at bedtime.   Essential hypertension Continue amlodipine  metoprolol  and hydralazine .     History of hip fracture status post nailing repair in January 2025 Continue physical therapy.  Fall precautions.   GERD Continue with PPI     Lab Results  Component Value Date   NA 132 (L) 02/05/2024   K 4.0 02/05/2024   CO2 25 02/05/2024   GLUCOSE 99 02/05/2024   BUN 17 02/05/2024   CREATININE 0.56 02/05/2024   CALCIUM  9.1 02/05/2024   GFR 80.49 12/13/2023   EGFR 82 12/08/2020   GFRNONAA >60 02/05/2024   Lab Results  Component Value Date   ALT 16 01/28/2024   AST 30 01/28/2024   ALKPHOS 76 01/28/2024   BILITOT 1.5 (H) 01/28/2024   Lab Results  Component Value Date   WBC 8.6 02/05/2024   HGB 12.2 02/05/2024   HCT 36.8 02/05/2024   MCV 88.2 02/05/2024   PLT 316 02/05/2024   Lab Results  Component Value Date   TSH 6.578 (H) 01/29/2024  Pt does not remember much about the hospitalization    HTN bp is stable today  No cp or palpitations or headaches or edema  No side effects to medicines  BP Readings from Last 3 Encounters:  02/21/24 138/84  02/05/24 133/68  01/21/24 (!) 160/80    Pulse Readings from Last 3 Encounters:  02/21/24 61  02/05/24 (!) 101  01/21/24 64   Amlodipine  2.5 mg daily (at night)  Metoprolol  12.5 mg bid (took 12.5 this am) - has been titrating up and down     Blood pressure has been a little high and pulse high also  Having a hard time stabilizing it like it was     Lab Results  Component Value  Date   NA 132 (L) 02/05/2024   K 4.0 02/05/2024   CO2 25 02/05/2024   GLUCOSE 99 02/05/2024   BUN 17 02/05/2024   CREATININE 0.56 02/05/2024   CALCIUM  9.1 02/05/2024   GFR 80.49 12/13/2023   EGFR 82 12/08/2020   GFRNONAA >60 02/05/2024   Taking salt pill three times daily  Fluid intake is moderate    Mood-is anxious  about blood pressure but otherwise doing ok     Patient Active Problem List   Diagnosis Date Noted   Vitamin D  deficiency 01/29/2024   Metabolic encephalopathy 01/29/2024   Skin lesion of right leg 10/18/2023   Hyperosmolar hyponatremia 10/18/2023   Blood loss anemia 09/13/2023   Closed right hip fracture (HCC) 09/05/2023   Abnormal urinalysis 09/02/2023   Acute cystitis 08/21/2023   Current use of proton pump inhibitor 03/13/2023   Pain in joint, lower leg 01/30/2023   Senile purpura (HCC) 12/26/2022   Skin tear of right lower leg without complication 12/26/2022   Dysuria 10/21/2021   Elevated alkaline phosphatase level 03/11/2021   Palpitations 11/10/2020   Migraine    Acute metabolic encephalopathy    Essential hypertension    H/O: CVA (cerebrovascular accident) 11/08/2020   Headache 09/26/2019   Fall at home 06/02/2019   Colon cancer screening 02/05/2018   Degenerative joint disease (DJD) of lumbar spine 05/02/2017   Internal hemorrhoids 05/02/2017   Generalized osteoarthritis of hand 12/11/2016   GAD (generalized anxiety disorder) 12/11/2016   Estrogen deficiency 05/11/2016   Encounter for screening mammogram for breast cancer 05/11/2016   OSA (obstructive sleep apnea) 03/06/2016   Fatigue 01/25/2016   Left knee pain 10/18/2015   Snoring 10/18/2015   Routine general medical examination at a health care facility 02/28/2015   Encounter for Medicare annual wellness exam 02/06/2013   Hyperlipidemia 08/27/2012   Irregular heart beat 08/05/2012   Encounter for gynecological examination 12/20/2010   Osteopenia 12/17/2009   BACK PAIN, LUMBAR  07/02/2009   IRRITABLE BOWEL SYNDROME 04/02/2009   Asymptomatic postmenopausal status 12/10/2008   Hypothyroidism 12/05/2007   Meniere's disease 12/05/2007   GERD 12/05/2007   Past Medical History:  Diagnosis Date   Anxiety 2022   Arthritis    Blood loss anemia 09/13/2023   Cataract 1997   Colon cancer screening 02/05/2018   DDD (degenerative disc disease)    chronic back pain   ETD (eustachian tube dysfunction)    GERD (gastroesophageal reflux disease)    Hypertension 2022   IBS (irritable bowel syndrome)    Meniere's disease    Migraine    still gets visual aura from time to time   Mild cognitive impairment    Osteoporosis    Sleep apnea 2017   CPAP   Stroke (  HCC) 2022   Syncopal episodes    after work-up - possible seizures?   Thyroid  disease    hypothyroid   TIA (transient ischemic attack) 11/09/2020   Past Surgical History:  Procedure Laterality Date   APPENDECTOMY     CATARACT EXTRACTION W/PHACO Right 05/21/2019   Procedure: CATARACT EXTRACTION PHACO AND INTRAOCULAR LENS PLACEMENT (IOC) RIGHT panoptix lens  00:35.0  21.7%  7.61;  Surgeon: Mittie Gaskin, MD;  Location: [facility];  Service: Ophthalmology;  Laterality: Right;  sleep apnea requests early   CATARACT EXTRACTION W/PHACO Left 06/11/2019   Procedure: CATARACT EXTRACTION PHACO AND INTRAOCULAR LENS PLACEMENT (IOC) LEFT PANOPTIX TORIC LENS  00:50.0  20.3%  10.21;  Surgeon: Mittie Gaskin, MD;  Location: [facility];  Service: Ophthalmology;  Laterality: Left;  sleep apnea requests early   COLONOSCOPY     last 2012 - [facility]   ESOPHAGOGASTRODUODENOSCOPY     Multiple, last 01/07/2017 with Savary dilation to 18 mm   EYE SURGERY  August 2022   Cataracs removed   HEMORRHOID BANDING  2018   [facility]   INTRAMEDULLARY (IM) NAIL INTERTROCHANTERIC Right 09/06/2023   Procedure: INTRAMEDULLARY (IM) NAIL INTERTROCHANTERIC;  Surgeon: Celena Sharper, MD;  Location: [facility];  Service:  Orthopedics;  Laterality: Right;   LUMBAR DISC SURGERY  1984   L5    Social History   Tobacco Use   Smoking status: Never    Passive exposure: Never   Smokeless tobacco: Never  Vaping Use   Vaping status: Never Used  Substance Use Topics   Alcohol use: Yes    Comment: Average 1 glass of wine/week   Drug use: Yes    Types: Marijuana    Comment: CBD drops   Family History  Problem Relation Age of Onset   Arthritis Mother    Varicose Veins Mother    Diabetes Paternal Aunt    Breast cancer Paternal Aunt    Hypertension Maternal Grandmother    Hypertension Maternal Grandfather    Hypertension Sister    Asthma Sister    Asthma Sister    Diabetes Paternal Aunt    Diabetes Paternal Aunt    Allergies  Allergen Reactions   Aspirin  Other (See Comments)    GI intolerance    Nsaids Other (See Comments)    GI discomfort and gastritis   Tolmetin Other (See Comments)    GI discomfort and gastritis   Current Outpatient Medications on File Prior to Visit  Medication Sig Dispense Refill   acetaminophen  (TYLENOL ) 325 MG tablet Take 2 tablets (650 mg total) by mouth every 6 (six) hours as needed for mild pain (pain score 1-3) or fever (or Fever >/= 101).     amLODipine  (NORVASC ) 5 MG tablet Take 1/2 tablet (2.5 mg) by mouth daily 45 tablet 0   B Complex-C (SUPER B COMPLEX PO) Take 1 tablet by mouth daily.     calcium  carbonate (TUMS - DOSED IN MG ELEMENTAL CALCIUM ) 500 MG chewable tablet Chew 1 tablet by mouth daily.     cholecalciferol  (VITAMIN D ) 25 MCG (1000 UNIT) tablet Take 1,000 Units by mouth daily.     clobetasol ointment (TEMOVATE) 0.05 % Apply 1 Application topically daily.     Cranberry 250 MG CAPS Take 1 capsule by mouth daily.     diclofenac  sodium (VOLTAREN ) 1 % GEL APPLY 2 GRAMS TOPICALLY FOUR TIMES A DAY TO AFFECTED AREAS (Patient taking differently: Apply 2 g topically 2 (two) times daily.) 300 g 3  levothyroxine  (SYNTHROID ) 75 MCG tablet TAKE 1 TABLET BY MOUTH DAILY  BEFORE BREAKFAST. 90 tablet 0   LORazepam  (ATIVAN ) 0.5 MG tablet Take 1 tablet (0.5 mg total) by mouth daily as needed for anxiety. 10 tablet 0   metoprolol  tartrate (LOPRESSOR ) 25 MG tablet Take 0.5 tablets (12.5 mg total) by mouth 2 (two) times daily. (Patient taking differently: Take 12.5 mg by mouth 2 (two) times daily. May increase to 25mg  twice daily if hypertension is not controlled) 90 tablet 1   omeprazole  (PRILOSEC) 40 MG capsule TAKE 1 CAPSULE (40 MG TOTAL) BY MOUTH DAILY. 90 capsule 0   sodium chloride  1 g tablet Take 1 tablet (1 g total) by mouth 2 (two) times daily with a meal. (Patient taking differently: Take 1 g by mouth 3 (three) times daily with meals.) 60 tablet 0   sucralfate  (CARAFATE ) 1 g tablet Take 1 g by mouth 4 (four) times daily as needed (Esophagitis).     No current facility-administered medications on file prior to visit.    Review of Systems  Constitutional:  Positive for fatigue. Negative for activity change, appetite change, fever and unexpected weight change.  HENT:  Negative for congestion, ear pain, rhinorrhea, sinus pressure and sore throat.   Eyes:  Negative for pain, redness and visual disturbance.  Respiratory:  Negative for cough, shortness of breath and wheezing.   Cardiovascular:  Negative for chest pain and palpitations.  Gastrointestinal:  Negative for abdominal pain, blood in stool, constipation and diarrhea.  Endocrine: Negative for polydipsia and polyuria.  Genitourinary:  Negative for dysuria, frequency and urgency.  Musculoskeletal:  Negative for arthralgias, back pain and myalgias.  Skin:  Negative for pallor and rash.  Allergic/Immunologic: Negative for environmental allergies.  Neurological:  Negative for dizziness, syncope and headaches.       Feels weak  Getting better   Hematological:  Negative for adenopathy. Does not bruise/bleed easily.  Psychiatric/Behavioral:  Negative for decreased concentration and dysphoric mood. The patient  is not nervous/anxious.        Objective:   Physical Exam Constitutional:      General: She is not in acute distress.    Appearance: Normal appearance. She is well-developed and normal weight. She is not ill-appearing or diaphoretic.  HENT:     Head: Normocephalic and atraumatic.  Eyes:     Conjunctiva/sclera: Conjunctivae normal.     Pupils: Pupils are equal, round, and reactive to light.  Neck:     Thyroid : No thyromegaly.     Vascular: No carotid bruit or JVD.  Cardiovascular:     Rate and Rhythm: Normal rate and regular rhythm.     Heart sounds: Normal heart sounds.     No gallop.  Pulmonary:     Effort: Pulmonary effort is normal. No respiratory distress.     Breath sounds: Normal breath sounds. No wheezing or rales.  Abdominal:     General: There is no distension or abdominal bruit.     Palpations: Abdomen is soft. There is no mass.     Tenderness: There is no abdominal tenderness.     Hernia: No hernia is present.  Musculoskeletal:     Cervical back: Normal range of motion and neck supple.     Right lower leg: No edema.     Left lower leg: No edema.  Lymphadenopathy:     Cervical: No cervical adenopathy.  Skin:    General: Skin is warm and dry.     Coloration: Skin  is not pale.     Findings: No rash.  Neurological:     Mental Status: She is alert.     Cranial Nerves: No cranial nerve deficit or facial asymmetry.     Motor: No weakness or tremor.     Coordination: Coordination normal.     Gait: Gait normal.     Deep Tendon Reflexes: Reflexes are normal and symmetric. Reflexes normal.  Psychiatric:        Mood and Affect: Mood normal.     Comments: Mentally very sharp  Not overly anxious            Assessment & Plan:   Problem List Items Addressed This Visit       Cardiovascular and Mediastinum   Essential hypertension   BP: 138/84   Blood pressure is labile at home Taking amlodipine  2.5 mg daily  Metoprolol  12.5 mg bid   Pulse Readings from  Last 3 Encounters:  02/21/24 61  02/05/24 (!) 101  01/21/24 64    Reassuring here  Taking 3 sodium tabls tdaily  Bmet now and advised further       Relevant Orders   Basic metabolic panel with GFR     Endocrine   Hypothyroidism   Lab Results  Component Value Date   TSH 6.578 (H) 01/29/2024   Taking levoth 85 mcg   Re check today  Dosing may have been off in hospital       Relevant Orders   TSH     Nervous and Auditory   Acute metabolic encephalopathy   Recent hosp  Uti -enterococcus  Electrolyte imbalance Rxn to cymbalta   Reviewed hospital records, lab results and studies in detail    Clinically resolved MS is back to baseline Labs today  Call back and Er precautions noted in detail today          Genitourinary   Acute cystitis   Recently hosp with enterococcus uti  Treatment with IV amp  Clinically improved/no longer confused and vitals are improved  Reviewed hospital records, lab results and studies in detail    Call back and Er precautions noted in detail today          Other   Hyperosmolar hyponatremia - Primary   Due to cymbalta  and uti  Hospitalized  Reviewed hospital records, lab results and studies in detail    Na 132 at d/c with  3 sodium tabs daily  Reassuring vitals and exam today   Bmet today        Relevant Orders   Basic metabolic panel with GFR   GAD (generalized anxiety disorder)   Off cymbalta -caused encephalopathy and low na   Overall doing ok w/o it  Was d/c with ativan  for emergencies

## 2024-02-21 NOTE — Assessment & Plan Note (Signed)
 Off cymbalta -caused encephalopathy and low na   Overall doing ok w/o it  Was d/c with ativan  for emergencies

## 2024-02-21 NOTE — Assessment & Plan Note (Signed)
 Due to cymbalta  and uti  Hospitalized  Reviewed hospital records, lab results and studies in detail    Na 132 at d/c with  3 sodium tabs daily  Reassuring vitals and exam today   Bmet today

## 2024-02-21 NOTE — Assessment & Plan Note (Signed)
 Recent hosp  Uti -enterococcus  Electrolyte imbalance Rxn to cymbalta   Reviewed hospital records, lab results and studies in detail    Clinically resolved MS is back to baseline Labs today  Call back and Er precautions noted in detail today

## 2024-02-21 NOTE — Assessment & Plan Note (Signed)
 Lab Results  Component Value Date   TSH 6.578 (H) 01/29/2024   Taking levoth 85 mcg   Re check today  Dosing may have been off in hospital

## 2024-02-21 NOTE — Assessment & Plan Note (Signed)
 Recently hosp with enterococcus uti  Treatment with IV amp  Clinically improved/no longer confused and vitals are improved  Reviewed hospital records, lab results and studies in detail    Call back and Er precautions noted in detail today

## 2024-02-21 NOTE — Assessment & Plan Note (Signed)
 BP: 138/84   Blood pressure is labile at home Taking amlodipine  2.5 mg daily  Metoprolol  12.5 mg bid   Pulse Readings from Last 3 Encounters:  02/21/24 61  02/05/24 (!) 101  01/21/24 64    Reassuring here  Taking 3 sodium tabls tdaily  Bmet now and advised further

## 2024-02-21 NOTE — Patient Instructions (Signed)
 Keep working on getting strength back  Use your walker if you need it   Continue your current medicines for now   Labs today   We will make adjustments based on results and plan follow up   Take care of yourself   If any symptoms some back

## 2024-02-22 ENCOUNTER — Ambulatory Visit: Admission: EM | Admit: 2024-02-22 | Discharge: 2024-02-22 | Disposition: A | Discharge status: Home / Self Care

## 2024-02-22 ENCOUNTER — Encounter: Payer: Self-pay | Admitting: Emergency Medicine

## 2024-02-22 DIAGNOSIS — I10 Essential (primary) hypertension: Secondary | ICD-10-CM | POA: Diagnosis not present

## 2024-02-22 NOTE — ED Triage Notes (Signed)
 Patient was concerned about elevated BP and HR. Patient denies CP and SOB.

## 2024-02-22 NOTE — ED Provider Notes (Signed)
 [facility] [place]  Note:  This document was prepared using Conservation officer, historic buildings and may include unintentional dictation errors.  MRN: [id number] DOB: 07-Feb-1941  Subjective:   Sarah Harper is a 83 y.o. female presenting for high blood pressure readings and heart rate at home earlier today.  Patient contacted her physician's office but physicians were out the office today for the holiday and the on-call covering physician recommended she go to urgent care for evaluation.  Patient reports that she takes metoprolol  25 mg daily for blood pressure control and is usually pretty efficient at controlling her blood pressure and heart rate.  Patient reports that today she had 2 blood pressure readings in the 180s over 100s.  Patient denies any headache, dizziness, blurred vision, chest pain, shortness of breath, weakness.  Patient checks her blood pressure every single day and today she checked it more than once.  Patient is looking for advice of what she should do if blood pressure readings continue to run high.  No current facility-administered medications for this encounter.  Current Outpatient Medications:    acetaminophen  (TYLENOL ) 325 MG tablet, Take 2 tablets (650 mg total) by mouth every 6 (six) hours as needed for mild pain (pain score 1-3) or fever (or Fever >/= 101)., Disp: , Rfl:    amLODipine  (NORVASC ) 5 MG tablet, Take 1/2 tablet (2.5 mg) by mouth daily, Disp: 45 tablet, Rfl: 0   B Complex-C (SUPER B COMPLEX PO), Take 1 tablet by mouth daily., Disp: , Rfl:    calcium  carbonate (TUMS - DOSED IN MG ELEMENTAL CALCIUM ) 500 MG chewable tablet, Chew 1 tablet by mouth daily., Disp: , Rfl:    cholecalciferol  (VITAMIN D ) 25 MCG (1000 UNIT) tablet, Take 1,000 Units by mouth daily., Disp: , Rfl:    clobetasol ointment (TEMOVATE) 0.05 %, Apply 1 Application topically daily., Disp: , Rfl:    Cranberry 250 MG CAPS, Take 1 capsule by mouth daily., Disp: , Rfl:    diclofenac  sodium  (VOLTAREN ) 1 % GEL, APPLY 2 GRAMS TOPICALLY FOUR TIMES A DAY TO AFFECTED AREAS (Patient taking differently: Apply 2 g topically 2 (two) times daily.), Disp: 300 g, Rfl: 3   levothyroxine  (SYNTHROID ) 75 MCG tablet, TAKE 1 TABLET BY MOUTH DAILY BEFORE BREAKFAST., Disp: 90 tablet, Rfl: 0   LORazepam  (ATIVAN ) 0.5 MG tablet, Take 1 tablet (0.5 mg total) by mouth daily as needed for anxiety., Disp: 10 tablet, Rfl: 0   metoprolol  tartrate (LOPRESSOR ) 25 MG tablet, Take 0.5 tablets (12.5 mg total) by mouth 2 (two) times daily. (Patient taking differently: Take 12.5 mg by mouth 2 (two) times daily. May increase to 25mg  twice daily if hypertension is not controlled), Disp: 90 tablet, Rfl: 1   omeprazole  (PRILOSEC) 40 MG capsule, TAKE 1 CAPSULE (40 MG TOTAL) BY MOUTH DAILY., Disp: 90 capsule, Rfl: 0   sodium chloride  1 g tablet, Take 1 tablet (1 g total) by mouth 2 (two) times daily with a meal. (Patient taking differently: Take 1 g by mouth 3 (three) times daily with meals.), Disp: 60 tablet, Rfl: 0   sucralfate  (CARAFATE ) 1 g tablet, Take 1 g by mouth 4 (four) times daily as needed (Esophagitis)., Disp: , Rfl:    Allergies  Allergen Reactions   Aspirin  Other (See Comments)    GI intolerance    Nsaids Other (See Comments)    GI discomfort and gastritis   Tolmetin Other (See Comments)    GI discomfort and gastritis    Past  Medical History:  Diagnosis Date   Anxiety 2022   Arthritis    Blood loss anemia 09/13/2023   Cataract 1997   Colon cancer screening 02/05/2018   DDD (degenerative disc disease)    chronic back pain   ETD (eustachian tube dysfunction)    GERD (gastroesophageal reflux disease)    Hypertension 2022   IBS (irritable bowel syndrome)    Meniere's disease    Migraine    still gets visual aura from time to time   Mild cognitive impairment    Osteoporosis    Sleep apnea 2017   CPAP   Stroke (HCC) 2022   Syncopal episodes    after work-up - possible seizures?   Thyroid  disease     hypothyroid   TIA (transient ischemic attack) 11/09/2020     Past Surgical History:  Procedure Laterality Date   APPENDECTOMY     CATARACT EXTRACTION W/PHACO Right 05/21/2019   Procedure: CATARACT EXTRACTION PHACO AND INTRAOCULAR LENS PLACEMENT (IOC) RIGHT panoptix lens  00:35.0  21.7%  7.61;  Surgeon: Mittie Gaskin, MD;  Location: [facility];  Service: Ophthalmology;  Laterality: Right;  sleep apnea requests early   CATARACT EXTRACTION W/PHACO Left 06/11/2019   Procedure: CATARACT EXTRACTION PHACO AND INTRAOCULAR LENS PLACEMENT (IOC) LEFT PANOPTIX TORIC LENS  00:50.0  20.3%  10.21;  Surgeon: Mittie Gaskin, MD;  Location: [facility];  Service: Ophthalmology;  Laterality: Left;  sleep apnea requests early   COLONOSCOPY     last 2012 - [facility]   ESOPHAGOGASTRODUODENOSCOPY     Multiple, last 01/07/2017 with Savary dilation to 18 mm   EYE SURGERY  August 2022   Cataracs removed   HEMORRHOID BANDING  2018   [facility]   INTRAMEDULLARY (IM) NAIL INTERTROCHANTERIC Right 09/06/2023   Procedure: INTRAMEDULLARY (IM) NAIL INTERTROCHANTERIC;  Surgeon: Celena Sharper, MD;  Location[facility];  Service: Orthopedics;  Laterality: Right;   LUMBAR DISC SURGERY  1984   L5     Family History  Problem Relation Age of Onset   Arthritis Mother    Varicose Veins Mother    Diabetes Paternal Aunt    Breast cancer Paternal Aunt    Hypertension Maternal Grandmother    Hypertension Maternal Grandfather    Hypertension Sister    Asthma Sister    Asthma Sister    Diabetes Paternal Aunt    Diabetes Paternal Aunt     Social History   Tobacco Use   Smoking status: Never    Passive exposure: Never   Smokeless tobacco: Never  Vaping Use   Vaping status: Never Used  Substance Use Topics   Alcohol use: Yes    Comment: Average 1 glass of wine/week   Drug use: Yes    Types: Marijuana    Comment: CBD drops    ROS Refer to HPI for ROS details.  Objective:   Vitals: BP  (!) 162/83 (BP Location: Left Arm)   Pulse 80   Temp 98.8 F (37.1 C) (Oral)   Resp 20   SpO2 98%   Physical Exam Vitals and nursing note reviewed.  Constitutional:      General: She is not in acute distress.    Appearance: Normal appearance. She is well-developed. She is not ill-appearing or toxic-appearing.  HENT:     Head: Normocephalic and atraumatic.  Cardiovascular:     Rate and Rhythm: Normal rate and regular rhythm.     Heart sounds: Normal heart sounds. No murmur heard. Pulmonary:  Effort: Pulmonary effort is normal. No respiratory distress.     Breath sounds: Normal breath sounds. No stridor. No wheezing, rhonchi or rales.  Chest:     Chest wall: No tenderness.  Skin:    General: Skin is warm and dry.  Neurological:     General: No focal deficit present.     Mental Status: She is alert and oriented to person, place, and time.  Psychiatric:        Mood and Affect: Mood normal.        Behavior: Behavior normal.     Procedures  No results found for this or any previous visit (from the past 24 hours).  No results found.   Assessment and Plan :     Discharge Instructions       1. Asymptomatic hypertension (Primary) - Patient is advised to continue monitoring blood pressure as normal and taking medication as directed. - Advised to monitor for any change in symptoms such as chest pain, shortness of breath, weakness, dizziness, blurred vision, headache or fatigue. - If he is experiencing any escalation of symptoms or blood pressure sustained of 180/100 contact physician or follow-up in emergency department for further evaluation and treatment. - Furthermore patient advised if blood pressure is below 100/60 do not take blood pressure medication as it could cause unsafe drop in blood pressure causing severe dizziness or loss of consciousness.      Zhania Shaheen B Elvina Bosch   Letti Towell, [place], [place] 02/22/24 1905

## 2024-02-22 NOTE — Discharge Instructions (Addendum)
  1. Asymptomatic hypertension (Primary) - Patient is advised to continue monitoring blood pressure as normal and taking medication as directed. - Advised to monitor for any change in symptoms such as chest pain, shortness of breath, weakness, dizziness, blurred vision, headache or fatigue. - If he is experiencing any escalation of symptoms or blood pressure sustained of 180/100 contact physician or follow-up in emergency department for further evaluation and treatment. - Furthermore patient advised if blood pressure is below 100/60 do not take blood pressure medication as it could cause unsafe drop in blood pressure causing severe dizziness or loss of consciousness.

## 2024-02-25 ENCOUNTER — Ambulatory Visit (INDEPENDENT_AMBULATORY_CARE_PROVIDER_SITE_OTHER): Admitting: Internal Medicine

## 2024-02-25 ENCOUNTER — Encounter: Payer: Self-pay | Admitting: Internal Medicine

## 2024-02-25 ENCOUNTER — Telehealth: Payer: Self-pay

## 2024-02-25 VITALS — BP 132/80 | HR 70 | Temp 98.3°F | Ht 64.0 in | Wt 122.0 lb

## 2024-02-25 DIAGNOSIS — I10 Essential (primary) hypertension: Secondary | ICD-10-CM | POA: Diagnosis not present

## 2024-02-25 MED ORDER — METOPROLOL TARTRATE 25 MG PO TABS: 12.5000 mg | ORAL_TABLET | Freq: Two times a day (BID) | ORAL | 1 refills | Status: DC

## 2024-02-25 NOTE — Assessment & Plan Note (Signed)
 BP Readings from Last 3 Encounters:  02/25/24 132/80  02/22/24 (!) 162/83  02/21/24 138/84   Repeat BP 144/64 by me (on right) Discussed that she should use a set dose of metoprolol ---she does get dizzy feeling when she looks down Amlodipine  2.5mg  daily and metoprolol  varying  Discussed increased risks of orthostatic hypotension with the metoprolol ---she should stick with the 12.5mg  twice a day If she has consistent BP over 150/95---would increase amlodipine  to 5mg 

## 2024-02-25 NOTE — Telephone Encounter (Signed)
 Jimmy Charlie FERNS, MD to Me     02/25/24  9:54 AM Please check to make sure her BP is better now   I spoke with Sarah Harper; Sarah Harper said since seen at [facility] on 02/22/24 Sarah Harper has been taking BP twice a day. Sarah Harper is not at home now and cannot remember BP values. Sarah Harper said BP has been fluctuating up and down. Sarah Harper thinks this morning BP was 140/ something. Sarah Harper said that starting 02/23/24 Sarah Harper has been taking metoprolol  25 mg instead of 12.5 mg. Sarah Harper said no CP,SOB,H/A,dizziness or blurred vision. Sarah Harper is concerned about BP fluctuating so much. Sarah Harper scheduled appt with Dr Jimmy 02/25/24 at 3:45 with UC & ED precautions and Sarah Harper voiced understanding. Sending note to Dr Jimmy.

## 2024-02-25 NOTE — Telephone Encounter (Signed)
 Per chart review tab pt was seen [facility] on 02/22/24. Sending note to Dr Jimmy since Dr Randeen is out of office.

## 2024-02-25 NOTE — Progress Notes (Signed)
 Subjective:    Patient ID: Sarah Harper, female    DOB: 1941/03/07, 83 y.o.   MRN: [id number]  HPI Here due to trouble with blood pressure variability With husband  Had spell where she collapsed at kitchen table Admitted June 9th Felt to be excessive hypotension from interaction between metoprolol  and duloxetine  (which had just been started) Duloxetine  was stopped  Had been on the metoprolol  for a few years At 25 bid--it was lowering her too much (so she cut it to 12.5mg  twice a day) After she left hospital--her BP has been going up So she had increased it back to the 25mg  bid (if her BP was up)  Went to urgent care on July 4th BP 180/109 with HR 88--despite the 25mg  metoprolol  No changes needed All this is triggering her anxiety  7/6 took just 12.5mg  --due to BP 143/83 and 74 HR Later 146/76  Takes 1/2 amlodipine  nightly Took 12.5 mg three times yesterday--and 25mg  this morning  No chest pain No SOB No palpitations  Current Outpatient Medications on File Prior to Visit  Medication Sig Dispense Refill   acetaminophen  (TYLENOL ) 325 MG tablet Take 2 tablets (650 mg total) by mouth every 6 (six) hours as needed for mild pain (pain score 1-3) or fever (or Fever >/= 101).     amLODipine  (NORVASC ) 5 MG tablet Take 1/2 tablet (2.5 mg) by mouth daily 45 tablet 0   B Complex-C (SUPER B COMPLEX PO) Take 1 tablet by mouth daily.     calcium  carbonate (TUMS - DOSED IN MG ELEMENTAL CALCIUM ) 500 MG chewable tablet Chew 1 tablet by mouth daily.     cholecalciferol  (VITAMIN D ) 25 MCG (1000 UNIT) tablet Take 1,000 Units by mouth daily.     clobetasol ointment (TEMOVATE) 0.05 % Apply 1 Application topically daily.     Cranberry 250 MG CAPS Take 1 capsule by mouth daily.     diclofenac  sodium (VOLTAREN ) 1 % GEL APPLY 2 GRAMS TOPICALLY FOUR TIMES A DAY TO AFFECTED AREAS (Patient taking differently: Apply 2 g topically 2 (two) times daily.) 300 g 3   levothyroxine  (SYNTHROID ) 75 MCG  tablet TAKE 1 TABLET BY MOUTH DAILY BEFORE BREAKFAST. 90 tablet 0   LORazepam  (ATIVAN ) 0.5 MG tablet Take 1 tablet (0.5 mg total) by mouth daily as needed for anxiety. 10 tablet 0   metoprolol  tartrate (LOPRESSOR ) 25 MG tablet Take 0.5 tablets (12.5 mg total) by mouth 2 (two) times daily. (Patient taking differently: Take 12.5 mg by mouth 2 (two) times daily. May increase to 25mg  twice daily if hypertension is not controlled) 90 tablet 1   omeprazole  (PRILOSEC) 40 MG capsule TAKE 1 CAPSULE (40 MG TOTAL) BY MOUTH DAILY. 90 capsule 0   sucralfate  (CARAFATE ) 1 g tablet Take 1 g by mouth 4 (four) times daily as needed (Esophagitis).     No current facility-administered medications on file prior to visit.    Allergies  Allergen Reactions   Aspirin  Other (See Comments)    GI intolerance    Nsaids Other (See Comments)    GI discomfort and gastritis   Tolmetin Other (See Comments)    GI discomfort and gastritis    Past Medical History:  Diagnosis Date   Anxiety 2022   Arthritis    Blood loss anemia 09/13/2023   Cataract 1997   Colon cancer screening 02/05/2018   DDD (degenerative disc disease)    chronic back pain   ETD (eustachian tube dysfunction)  GERD (gastroesophageal reflux disease)    Hypertension 2022   IBS (irritable bowel syndrome)    Meniere's disease    Migraine    still gets visual aura from time to time   Mild cognitive impairment    Osteoporosis    Sleep apnea 2017   CPAP   Stroke (HCC) 2022   Syncopal episodes    after work-up - possible seizures?   Thyroid  disease    hypothyroid   TIA (transient ischemic attack) 11/09/2020    Past Surgical History:  Procedure Laterality Date   APPENDECTOMY     CATARACT EXTRACTION W/PHACO Right 05/21/2019   Procedure: CATARACT EXTRACTION PHACO AND INTRAOCULAR LENS PLACEMENT (IOC) RIGHT panoptix lens  00:35.0  21.7%  7.61;  Surgeon: Mittie Gaskin, MD;  Location: [facility];  Service: Ophthalmology;   Laterality: Right;  sleep apnea requests early   CATARACT EXTRACTION W/PHACO Left 06/11/2019   Procedure: CATARACT EXTRACTION PHACO AND INTRAOCULAR LENS PLACEMENT (IOC) LEFT PANOPTIX TORIC LENS  00:50.0  20.3%  10.21;  Surgeon: Mittie Gaskin, MD;  Location: [facility];  Service: Ophthalmology;  Laterality: Left;  sleep apnea requests early   COLONOSCOPY     last 2012 - [facility]   ESOPHAGOGASTRODUODENOSCOPY     Multiple, last 01/07/2017 with Savary dilation to 18 mm   EYE SURGERY  August 2022   Cataracs removed   HEMORRHOID BANDING  2018   [facility]   INTRAMEDULLARY (IM) NAIL INTERTROCHANTERIC Right 09/06/2023   Procedure: INTRAMEDULLARY (IM) NAIL INTERTROCHANTERIC;  Surgeon: Celena Sharper, MD;  Location: [facility];  Service: Orthopedics;  Laterality: Right;   LUMBAR DISC SURGERY  1984   L5     Family History  Problem Relation Age of Onset   Arthritis Mother    Varicose Veins Mother    Diabetes Paternal Aunt    Breast cancer Paternal Aunt    Hypertension Maternal Grandmother    Hypertension Maternal Grandfather    Hypertension Sister    Asthma Sister    Asthma Sister    Diabetes Paternal Aunt    Diabetes Paternal Aunt     Social History   Socioeconomic History   Marital status: Married    Spouse name: Erland   Number of children: 0   Years of education: Not on file   Highest education level: Some college, no degree  Occupational History   Occupation: Retired  Tobacco Use   Smoking status: Never    Passive exposure: Never   Smokeless tobacco: Never  Vaping Use   Vaping status: Never Used  Substance and Sexual Activity   Alcohol use: Yes    Comment: Average 1 glass of wine/week   Drug use: Yes    Types: Marijuana    Comment: CBD drops   Sexual activity: Yes    Birth control/protection: Post-menopausal  Other Topics Concern   Not on file  Social History Narrative   Married, husband is a type I diabetic.  No children.   Elderly mother in her 90s lives in  an assisted living facility.   Very occasional white wine with dinner, never smoker no drug use.  Limited caffeine .   Social Drivers of Corporate investment banker Strain: Low Risk  (12/26/2023)   Overall Financial Resource Strain (CARDIA)    Difficulty of Paying Living Expenses: Not hard at all  Food Insecurity: No Food Insecurity (01/29/2024)   Hunger Vital Sign    Worried About Running Out of Food in the Last Year: Never true  Ran Out of Food in the Last Year: Never true  Transportation Needs: No Transportation Needs (01/29/2024)   PRAPARE - Administrator, Civil Service (Medical): No    Lack of Transportation (Non-Medical): No  Physical Activity: Insufficiently Active (12/26/2023)   Exercise Vital Sign    Days of Exercise per Week: 2 days    Minutes of Exercise per Session: 30 min  Stress: No Stress Concern Present (12/26/2023)   [employer] of Occupational Health - Occupational Stress Questionnaire    Feeling of Stress : Not at all  Social Connections: Moderately Integrated (01/29/2024)   Social Connection and Isolation Panel    Frequency of Communication with Friends and Family: More than three times a week    Frequency of Social Gatherings with Friends and Family: Once a week    Attends Religious Services: 1 to 4 times per year    Active Member of [employer] or Organizations: No    Attends Banker Meetings: Never    Marital Status: Married  Catering manager Violence: Not At Risk (01/29/2024)   Humiliation, Afraid, Rape, and Kick questionnaire    Fear of Current or Ex-Partner: No    Emotionally Abused: No    Physically Abused: No    Sexually Abused: No   Review of Systems Never had heart rate under 60 that she knows of No dizziness or syncope ----other than the time after duloxetine  was started    Objective:   Physical Exam Constitutional:      Appearance: Normal appearance.  Cardiovascular:     Rate and Rhythm: Normal rate and regular rhythm.      Heart sounds: No murmur heard.    No gallop.  Pulmonary:     Effort: Pulmonary effort is normal.     Breath sounds: Normal breath sounds. No wheezing or rales.  Musculoskeletal:     Right lower leg: No edema.     Left lower leg: No edema.  Neurological:     Mental Status: She is alert.            Assessment & Plan:

## 2024-02-25 NOTE — Telephone Encounter (Signed)
 Sounds good I will check her then

## 2024-02-25 NOTE — Telephone Encounter (Signed)
 SABRA

## 2024-02-26 ENCOUNTER — Ambulatory Visit

## 2024-02-28 ENCOUNTER — Encounter: Payer: Self-pay | Admitting: Podiatry

## 2024-02-28 ENCOUNTER — Ambulatory Visit

## 2024-02-28 ENCOUNTER — Ambulatory Visit: Admitting: Podiatry

## 2024-02-28 VITALS — Ht 64.0 in | Wt 122.0 lb

## 2024-02-28 DIAGNOSIS — M79675 Pain in left toe(s): Secondary | ICD-10-CM

## 2024-02-28 DIAGNOSIS — B351 Tinea unguium: Secondary | ICD-10-CM

## 2024-02-28 DIAGNOSIS — M79674 Pain in right toe(s): Secondary | ICD-10-CM | POA: Diagnosis not present

## 2024-02-28 NOTE — Progress Notes (Signed)
  Subjective:  Patient ID: Sarah Harper, female    DOB: 1941-07-18,  MRN: [id number]  83 y.o. female presents painful thick toenails that are difficult to trim. Pain interferes with ambulation. Aggravating factors include wearing enclosed shoe gear. Pain is relieved with periodic professional debridement. Patient states she has to have right hip surgery again. Chief Complaint  Patient presents with   Nail Problem    Pt is here for [facility] PCP is Dr Randeen and LOV was last week.   New problem(s): None   PCP is Tower, Laine LABOR, MD.  Allergies  Allergen Reactions   Aspirin  Other (See Comments)    GI intolerance    Nsaids Other (See Comments)    GI discomfort and gastritis   Tolmetin Other (See Comments)    GI discomfort and gastritis    Review of Systems: Negative except as noted in the HPI.   Objective:  Sarah Harper is a pleasant 83 y.o. female WD, WN in NAD. AAO x 3.  Neurovascular status intact and unchaged bilaterally.  Dermatological Examination: Pedal skin with normal turgor, texture and tone b/l. No open wounds nor interdigital macerations noted. Toenails 1-5 b/l thick, discolored, elongated with subungual debris and pain on dorsal palpation. No hyperkeratotic lesions noted b/l.   Musculoskeletal Examination: Muscle strength 5/5 to b/l LE.  No pain, crepitus noted b/l. HAV with bunion deformity b/l.  Radiographs: None  Last A1c:       No data to display           Assessment:   1. Pain due to onychomycosis of toenails of both feet    Plan:  Consent given for treatment. Patient examined. All patient's and/or POA's questions/concerns addressed on today's visit. Mycotic toenails 1-5 debrided in length and girth without incident. Continue soft, supportive shoe gear daily. Report any pedal injuries to medical professional. Call office if there are any quesitons/concerns. -Patient/POA to call should there be question/concern in the interim.  Return in about 3  months (around 05/30/2024).  Delon LITTIE Merlin, DPM      [facility] LOCATION: [address], [place] [address]                   Office [phone]   [facility] LOCATION: [address] [place], [place] [address] Office [phone]

## 2024-03-04 ENCOUNTER — Ambulatory Visit

## 2024-03-06 ENCOUNTER — Ambulatory Visit

## 2024-03-14 ENCOUNTER — Ambulatory Visit

## 2024-03-16 ENCOUNTER — Telehealth: Payer: Self-pay | Admitting: Family Medicine

## 2024-03-16 DIAGNOSIS — Z79899 Other long term (current) drug therapy: Secondary | ICD-10-CM

## 2024-03-16 DIAGNOSIS — E559 Vitamin D deficiency, unspecified: Secondary | ICD-10-CM

## 2024-03-16 DIAGNOSIS — E039 Hypothyroidism, unspecified: Secondary | ICD-10-CM

## 2024-03-16 DIAGNOSIS — E7800 Pure hypercholesterolemia, unspecified: Secondary | ICD-10-CM

## 2024-03-16 DIAGNOSIS — I10 Essential (primary) hypertension: Secondary | ICD-10-CM

## 2024-03-16 NOTE — Telephone Encounter (Signed)
-----   Message from Harlene Du sent at 02/19/2024  3:52 PM EDT ----- Regarding: Lab Mon 03/17/24 Hello,   Patient has a lab appointment on Monday 03/17/24. Can we get lab orders please.   Thanks

## 2024-03-16 NOTE — Telephone Encounter (Signed)
-----   Message from Sarah Harper sent at 02/19/2024  3:52 PM EDT ----- Regarding: Lab Mon 03/17/24 Hello,   Patient has a lab appointment on Monday 03/17/24. Can we get lab orders please.   Thanks

## 2024-03-17 ENCOUNTER — Other Ambulatory Visit (INDEPENDENT_AMBULATORY_CARE_PROVIDER_SITE_OTHER)

## 2024-03-17 DIAGNOSIS — E039 Hypothyroidism, unspecified: Secondary | ICD-10-CM

## 2024-03-17 DIAGNOSIS — I10 Essential (primary) hypertension: Secondary | ICD-10-CM

## 2024-03-17 DIAGNOSIS — E871 Hypo-osmolality and hyponatremia: Secondary | ICD-10-CM

## 2024-03-17 DIAGNOSIS — Z79899 Other long term (current) drug therapy: Secondary | ICD-10-CM

## 2024-03-17 DIAGNOSIS — E870 Hyperosmolality and hypernatremia: Secondary | ICD-10-CM | POA: Diagnosis not present

## 2024-03-17 DIAGNOSIS — E559 Vitamin D deficiency, unspecified: Secondary | ICD-10-CM

## 2024-03-17 DIAGNOSIS — E7800 Pure hypercholesterolemia, unspecified: Secondary | ICD-10-CM | POA: Diagnosis not present

## 2024-03-17 LAB — CBC WITH DIFFERENTIAL/PLATELET
Basophils Absolute: 0.1 K/uL (ref 0.0–0.1)
Basophils Relative: 1.2 % (ref 0.0–3.0)
Eosinophils Absolute: 0.1 K/uL (ref 0.0–0.7)
Eosinophils Relative: 2.5 % (ref 0.0–5.0)
HCT: 39.5 % (ref 36.0–46.0)
Hemoglobin: 12.9 g/dL (ref 12.0–15.0)
Lymphocytes Relative: 31.1 % (ref 12.0–46.0)
Lymphs Abs: 1.6 K/uL (ref 0.7–4.0)
MCHC: 32.7 g/dL (ref 30.0–36.0)
MCV: 88.5 fl (ref 78.0–100.0)
Monocytes Absolute: 0.3 K/uL (ref 0.1–1.0)
Monocytes Relative: 6.4 % (ref 3.0–12.0)
Neutro Abs: 3.1 K/uL (ref 1.4–7.7)
Neutrophils Relative %: 58.8 % (ref 43.0–77.0)
Platelets: 295 K/uL (ref 150.0–400.0)
RBC: 4.46 Mil/uL (ref 3.87–5.11)
RDW: 15.7 % — ABNORMAL HIGH (ref 11.5–15.5)
WBC: 5.3 K/uL (ref 4.0–10.5)

## 2024-03-17 LAB — BASIC METABOLIC PANEL WITH GFR
BUN: 13 mg/dL (ref 6–23)
CO2: 32 meq/L (ref 19–32)
Calcium: 9.8 mg/dL (ref 8.4–10.5)
Chloride: 99 meq/L (ref 96–112)
Creatinine, Ser: 0.67 mg/dL (ref 0.40–1.20)
GFR: 81.2 mL/min (ref >60.00)
Glucose, Bld: 115 mg/dL — ABNORMAL HIGH (ref 70–99)
Potassium: 4 meq/L (ref 3.5–5.1)
Sodium: 138 meq/L (ref 135–145)

## 2024-03-17 LAB — COMPREHENSIVE METABOLIC PANEL WITH GFR
ALT: 17 U/L (ref 0–35)
AST: 22 U/L (ref 0–37)
Albumin: 4.5 g/dL (ref 3.5–5.2)
Alkaline Phosphatase: 93 U/L (ref 39–117)
BUN: 13 mg/dL (ref 6–23)
CO2: 32 meq/L (ref 19–32)
Calcium: 9.8 mg/dL (ref 8.4–10.5)
Chloride: 99 meq/L (ref 96–112)
Creatinine, Ser: 0.67 mg/dL (ref 0.40–1.20)
GFR: 81.2 mL/min (ref >60.00)
Glucose, Bld: 115 mg/dL — ABNORMAL HIGH (ref 70–99)
Potassium: 4 meq/L (ref 3.5–5.1)
Sodium: 138 meq/L (ref 135–145)
Total Bilirubin: 0.7 mg/dL (ref 0.2–1.2)
Total Protein: 6.8 g/dL (ref 6.0–8.3)

## 2024-03-17 LAB — LIPID PANEL
Cholesterol: 219 mg/dL — ABNORMAL HIGH (ref 0–200)
HDL: 81.1 mg/dL (ref >39.00)
LDL Cholesterol: 120 mg/dL — ABNORMAL HIGH (ref 0–99)
NonHDL: 137.61
Total CHOL/HDL Ratio: 3
Triglycerides: 87 mg/dL (ref 0.0–149.0)
VLDL: 17.4 mg/dL (ref 0.0–40.0)

## 2024-03-18 ENCOUNTER — Ambulatory Visit

## 2024-03-18 ENCOUNTER — Ambulatory Visit: Payer: Self-pay | Admitting: Family Medicine

## 2024-03-18 LAB — VITAMIN B12: Vitamin B-12: 650 pg/mL (ref 211–911)

## 2024-03-18 LAB — TSH: TSH: 2.2 u[IU]/mL (ref 0.35–5.50)

## 2024-03-18 LAB — VITAMIN D 25 HYDROXY (VIT D DEFICIENCY, FRACTURES): VITD: 38.31 ng/mL (ref 30.00–100.00)

## 2024-03-21 ENCOUNTER — Encounter: Admitting: Family Medicine

## 2024-03-27 ENCOUNTER — Ambulatory Visit

## 2024-04-01 ENCOUNTER — Ambulatory Visit

## 2024-04-04 ENCOUNTER — Ambulatory Visit

## 2024-04-08 ENCOUNTER — Ambulatory Visit

## 2024-04-10 ENCOUNTER — Ambulatory Visit

## 2024-04-11 ENCOUNTER — Ambulatory Visit (INDEPENDENT_AMBULATORY_CARE_PROVIDER_SITE_OTHER): Admitting: Family Medicine

## 2024-04-11 ENCOUNTER — Encounter: Payer: Self-pay | Admitting: Family Medicine

## 2024-04-11 VITALS — BP 130/74 | HR 59 | Temp 98.4°F | Ht 64.0 in | Wt 122.0 lb

## 2024-04-11 DIAGNOSIS — Z79899 Other long term (current) drug therapy: Secondary | ICD-10-CM

## 2024-04-11 DIAGNOSIS — Z0000 Encounter for general adult medical examination without abnormal findings: Secondary | ICD-10-CM | POA: Diagnosis not present

## 2024-04-11 DIAGNOSIS — E7800 Pure hypercholesterolemia, unspecified: Secondary | ICD-10-CM

## 2024-04-11 DIAGNOSIS — E039 Hypothyroidism, unspecified: Secondary | ICD-10-CM

## 2024-04-11 DIAGNOSIS — Z1231 Encounter for screening mammogram for malignant neoplasm of breast: Secondary | ICD-10-CM

## 2024-04-11 DIAGNOSIS — M8589 Other specified disorders of bone density and structure, multiple sites: Secondary | ICD-10-CM

## 2024-04-11 DIAGNOSIS — E559 Vitamin D deficiency, unspecified: Secondary | ICD-10-CM

## 2024-04-11 DIAGNOSIS — F411 Generalized anxiety disorder: Secondary | ICD-10-CM

## 2024-04-11 DIAGNOSIS — I10 Essential (primary) hypertension: Secondary | ICD-10-CM | POA: Diagnosis not present

## 2024-04-11 DIAGNOSIS — S72001S Fracture of unspecified part of neck of right femur, sequela: Secondary | ICD-10-CM

## 2024-04-11 DIAGNOSIS — E2839 Other primary ovarian failure: Secondary | ICD-10-CM

## 2024-04-11 DIAGNOSIS — K219 Gastro-esophageal reflux disease without esophagitis: Secondary | ICD-10-CM

## 2024-04-11 MED ORDER — AMLODIPINE BESYLATE 5 MG PO TABS: ORAL_TABLET | ORAL | 3 refills | Status: AC

## 2024-04-11 MED ORDER — METOPROLOL TARTRATE 25 MG PO TABS: 12.5000 mg | ORAL_TABLET | Freq: Two times a day (BID) | ORAL | 3 refills | Status: DC

## 2024-04-11 NOTE — Assessment & Plan Note (Signed)
 With history of barrett's esophagus  On omeprazole  40 mg daily   Carafate  prn  Under GI care  B12 and D levels in normal range

## 2024-04-11 NOTE — Assessment & Plan Note (Signed)
 Mammogram ordered Pt will call to schedule  Declines breast exam

## 2024-04-11 NOTE — Progress Notes (Signed)
 Subjective:    Patient ID: Sarah Harper, female    DOB: 1941-05-19, 83 y.o.   MRN: [id number]  HPI  Here for health maintenance exam and to review chronic medical problems   Wt Readings from Last 3 Encounters:  04/11/24 122 lb (55.3 kg)  02/28/24 122 lb (55.3 kg)  02/25/24 122 lb (55.3 kg)   20.94 kg/m  Vitals:   04/11/24 0955  BP: 130/74  Pulse: (!) 59  Temp: 98.4 F (36.9 C)  SpO2: 97%    Immunization History  Administered Date(s) Administered   Fluad Quad(high Dose 65+) 05/07/2019, 05/22/2020, 05/27/2021, 05/17/2022   Fluad Trivalent(High Dose 65+) 06/20/2023   Influenza Whole 05/22/2007, 05/21/2009   Influenza,inj,Quad PF,6+ Mos 06/11/2013, 05/28/2014, 05/14/2015, 05/12/2016, 05/10/2017, 05/16/2018   PFIZER(Purple Top)SARS-COV-2 Vaccination 09/12/2019, 10/03/2019, 04/24/2020   Pfizer Covid-19 Vaccine Bivalent Booster 35yrs & up 06/17/2021   Pneumococcal Conjugate-13 02/24/2014   Pneumococcal Polysaccharide-23 12/22/2011   Td 12/19/2001, 12/22/2011   Tdap 01/30/2023   Zoster Recombinant(Shingrix) 08/20/2021, 12/14/2021   Zoster, Live 11/12/2012    Health Maintenance Due  Topic Date Due   MAMMOGRAM  03/04/2024   Still dealing with some loose bone in hip after her surgery  Doing some ultrasound treatments for that  Uses walker   Mammogram 02/2023 - wants to schedule  Self breast exam-no lumps   Gyn health No problems    Colon cancer screening -neg cologuard 03/2021   Bone health  Dexa 04/2022  Had a hip fracture  Falls-one  Is using walker to prevent falls Trying to get stronger    Supplements -vitamin D   Also tums for calcium   Last vitamin D  Lab Results  Component Value Date   VD25OH 38.31 03/17/2024    Exercise  Uses some exercise bands when seated  Limited due ot hip right now    Mood    04/11/2024   10:01 AM 02/25/2024    3:53 PM 02/21/2024   12:07 PM 01/21/2024    3:52 PM 12/26/2023    1:53 PM  Depression screen PHQ 2/9   Decreased Interest 0 1 1 2  0  Down, Depressed, Hopeless 0 1 0 2 0  PHQ - 2 Score 0 2 1 4  0  Altered sleeping 0 0 0 3   Tired, decreased energy 1 2 2 3    Change in appetite 0 0 0 0   Feeling bad or failure about yourself  0 0 0 1   Trouble concentrating 0 0 0 1   Moving slowly or fidgety/restless 0 0 0 0   Suicidal thoughts 0 0 0 0   PHQ-9 Score 1 4 3 12    Difficult doing work/chores Not difficult at all Somewhat difficult Somewhat difficult Somewhat difficult       04/11/2024   10:02 AM 02/25/2024    3:53 PM 02/21/2024   12:07 PM 01/21/2024    3:52 PM  GAD 7 : Generalized Anxiety Score  Nervous, Anxious, on Edge 1 1 2 2   Control/stop worrying 0 0 0 1  Worry too much - different things 0 0 0 1  Trouble relaxing 0 0 0 1  Restless 0 0 0 0  Easily annoyed or irritable 0 0 0 1  Afraid - awful might happen 0 0 0 0  Total GAD 7 Score 1 1 2 6   Anxiety Difficulty Not difficult at all Not difficult at all Somewhat difficult Somewhat difficult      HTN bp is stable today  No  cp or palpitations or headaches or edema  No side effects to medicines  BP Readings from Last 3 Encounters:  04/11/24 130/74  02/25/24 132/80  02/22/24 (!) 162/83    Amlodipine  2.5 mg daily  Metoprolol  12.5 mg daily   Blood pressure is better She is pleased No more dizzy spells   Lab Results  Component Value Date   NA 138 03/17/2024   NA 138 03/17/2024   K 4.0 03/17/2024   K 4.0 03/17/2024   CO2 32 03/17/2024   CO2 32 03/17/2024   GLUCOSE 115 (H) 03/17/2024   GLUCOSE 115 (H) 03/17/2024   BUN 13 03/17/2024   BUN 13 03/17/2024   CREATININE 0.67 03/17/2024   CREATININE 0.67 03/17/2024   CALCIUM  9.8 03/17/2024   CALCIUM  9.8 03/17/2024   GFR 81.20 03/17/2024   GFR 81.20 03/17/2024   EGFR 82 12/08/2020   GFRNONAA >60 02/05/2024    Hypothyroidism  Pt has no clinical changes No change in energy level/ hair or skin/ edema and no tremor Lab Results  Component Value Date   TSH 2.20 03/17/2024    Levothyroxine  75 mcg daily  GERD Omeprazole  40 mg daily   Lab Results  Component Value Date   VITAMINB12 650 03/17/2024    Hyperlipidemia Lab Results  Component Value Date   CHOL 219 (H) 03/17/2024   CHOL 184 03/13/2023   CHOL 181 03/08/2022   Lab Results  Component Value Date   HDL 81.10 03/17/2024   HDL 72.90 03/13/2023   HDL 67.50 03/08/2022   Lab Results  Component Value Date   LDLCALC 120 (H) 03/17/2024   LDLCALC 99 03/13/2023   LDLCALC 101 (H) 03/08/2022   Lab Results  Component Value Date   TRIG 87.0 03/17/2024   TRIG 62.0 03/13/2023   TRIG 63.0 03/08/2022   Lab Results  Component Value Date   CHOLHDL 3 03/17/2024   CHOLHDL 3 03/13/2023   CHOLHDL 3 03/08/2022   Lab Results  Component Value Date   LDLDIRECT 115.5 02/12/2013   LDLDIRECT 109.9 12/19/2010   LDLDIRECT 117.7 12/17/2009   Is a healthy eater  LDL is up but so is HDL   She usually eats a diabetic diet with her hubby   Some ice cream -not willing to stop it   Lab Results  Component Value Date   ALT 17 03/17/2024   AST 22 03/17/2024   ALKPHOS 93 03/17/2024   BILITOT 0.7 03/17/2024     Lab Results  Component Value Date   WBC 5.3 03/17/2024   HGB 12.9 03/17/2024   HCT 39.5 03/17/2024   MCV 88.5 03/17/2024   PLT 295.0 03/17/2024      Patient Active Problem List   Diagnosis Date Noted   Vitamin D  deficiency 01/29/2024   Closed right hip fracture (HCC) 09/05/2023   Current use of proton pump inhibitor 03/13/2023   Pain in joint, lower leg 01/30/2023   Migraine    Essential hypertension    H/O: CVA (cerebrovascular accident) 11/08/2020   Colon cancer screening 02/05/2018   Degenerative joint disease (DJD) of lumbar spine 05/02/2017   Internal hemorrhoids 05/02/2017   Generalized osteoarthritis of hand 12/11/2016   GAD (generalized anxiety disorder) 12/11/2016   Estrogen deficiency 05/11/2016   Encounter for screening mammogram for breast cancer 05/11/2016   OSA  (obstructive sleep apnea) 03/06/2016   Left knee pain 10/18/2015   Snoring 10/18/2015   Routine general medical examination at a health care facility 02/28/2015   Hyperlipidemia 08/27/2012  Osteopenia 12/17/2009   BACK PAIN, LUMBAR 07/02/2009   IRRITABLE BOWEL SYNDROME 04/02/2009   Asymptomatic postmenopausal status 12/10/2008   Hypothyroidism 12/05/2007   Meniere's disease 12/05/2007   GERD 12/05/2007   Past Medical History:  Diagnosis Date   Anxiety 2022   Arthritis    Blood loss anemia 09/13/2023   Cataract 1997   Colon cancer screening 02/05/2018   DDD (degenerative disc disease)    chronic back pain   ETD (eustachian tube dysfunction)    GERD (gastroesophageal reflux disease)    Hypertension 2022   IBS (irritable bowel syndrome)    Meniere's disease    Migraine    still gets visual aura from time to time   Mild cognitive impairment    Osteoporosis    Sleep apnea 2017   CPAP   Stroke (HCC) 2022   Syncopal episodes    after work-up - possible seizures?   Thyroid  disease 1990   hypothyroid   TIA (transient ischemic attack) 11/09/2020   Past Surgical History:  Procedure Laterality Date   APPENDECTOMY     CATARACT EXTRACTION W/PHACO Right 05/21/2019   Procedure: CATARACT EXTRACTION PHACO AND INTRAOCULAR LENS PLACEMENT (IOC) RIGHT panoptix lens  00:35.0  21.7%  7.61;  Surgeon: Mittie Gaskin, MD;  Location: [facility];  Service: Ophthalmology;  Laterality: Right;  sleep apnea requests early   CATARACT EXTRACTION W/PHACO Left 06/11/2019   Procedure: CATARACT EXTRACTION PHACO AND INTRAOCULAR LENS PLACEMENT (IOC) LEFT PANOPTIX TORIC LENS  00:50.0  20.3%  10.21;  Surgeon: Mittie Gaskin, MD;  Location: [facility];  Service: Ophthalmology;  Laterality: Left;  sleep apnea requests early   COLONOSCOPY     last 2012 - [facility]   ESOPHAGOGASTRODUODENOSCOPY     Multiple, last 01/07/2017 with Savary dilation to 18 mm   EYE SURGERY  August 2022    Cataracs removed   FRACTURE SURGERY  2025   HEMORRHOID BANDING  2018   [facility]   INTRAMEDULLARY (IM) NAIL INTERTROCHANTERIC Right 09/06/2023   Procedure: INTRAMEDULLARY (IM) NAIL INTERTROCHANTERIC;  Surgeon: Celena Sharper, MD;  Location: [facility];  Service: Orthopedics;  Laterality: Right;   LUMBAR DISC SURGERY  1984   L5    Social History   Tobacco Use   Smoking status: Never    Passive exposure: Never   Smokeless tobacco: Never  Vaping Use   Vaping status: Never Used  Substance Use Topics   Alcohol use: Yes    Comment: Average 1 glass of wine/week   Drug use: Not Currently    Types: Marijuana    Comment: CBD drops   Family History  Problem Relation Age of Onset   Arthritis Mother    Varicose Veins Mother    Diabetes Paternal Aunt    Breast cancer Paternal Aunt    Hypertension Maternal Grandmother    Hypertension Maternal Grandfather    Hypertension Sister    Asthma Sister    Asthma Sister    Diabetes Paternal Aunt    Diabetes Paternal Aunt    Allergies  Allergen Reactions   Aspirin  Other (See Comments)    GI intolerance    Nsaids Other (See Comments)    GI discomfort and gastritis   Tolmetin Other (See Comments)    GI discomfort and gastritis   Current Outpatient Medications on File Prior to Visit  Medication Sig Dispense Refill   acetaminophen  (TYLENOL ) 325 MG tablet Take 2 tablets (650 mg total) by mouth every 6 (six) hours as needed for mild  pain (pain score 1-3) or fever (or Fever >/= 101).     B Complex-C (SUPER B COMPLEX PO) Take 1 tablet by mouth daily.     calcium  carbonate (TUMS - DOSED IN MG ELEMENTAL CALCIUM ) 500 MG chewable tablet Chew 1 tablet by mouth daily.     cholecalciferol  (VITAMIN D ) 25 MCG (1000 UNIT) tablet Take 1,000 Units by mouth daily.     Cranberry 250 MG CAPS Take 1 capsule by mouth daily.     diclofenac  sodium (VOLTAREN ) 1 % GEL APPLY 2 GRAMS TOPICALLY FOUR TIMES A DAY TO AFFECTED AREAS (Patient taking differently: Apply 2 g topically  2 (two) times daily.) 300 g 3   levothyroxine  (SYNTHROID ) 75 MCG tablet TAKE 1 TABLET BY MOUTH DAILY BEFORE BREAKFAST. 90 tablet 0   LORazepam  (ATIVAN ) 0.5 MG tablet Take 1 tablet (0.5 mg total) by mouth daily as needed for anxiety. 10 tablet 0   omeprazole  (PRILOSEC) 40 MG capsule TAKE 1 CAPSULE (40 MG TOTAL) BY MOUTH DAILY. 90 capsule 0   sucralfate  (CARAFATE ) 1 g tablet Take 1 g by mouth 4 (four) times daily as needed (Esophagitis).     No current facility-administered medications on file prior to visit.    Review of Systems  Constitutional:  Negative for activity change, appetite change, fatigue, fever and unexpected weight change.  HENT:  Negative for congestion, ear pain, rhinorrhea, sinus pressure and sore throat.   Eyes:  Negative for pain, redness and visual disturbance.  Respiratory:  Negative for cough, shortness of breath and wheezing.   Cardiovascular:  Negative for chest pain and palpitations.  Gastrointestinal:  Negative for abdominal pain, blood in stool, constipation and diarrhea.  Endocrine: Negative for polydipsia and polyuria.  Genitourinary:  Negative for dysuria, frequency and urgency.  Musculoskeletal:  Positive for arthralgias. Negative for back pain and myalgias.       Right hip pain   Skin:  Negative for pallor and rash.  Allergic/Immunologic: Negative for environmental allergies.  Neurological:  Negative for dizziness, syncope and headaches.  Hematological:  Negative for adenopathy. Does not bruise/bleed easily.  Psychiatric/Behavioral:  Negative for decreased concentration and dysphoric mood. The patient is not nervous/anxious.        Objective:   Physical Exam Constitutional:      General: She is not in acute distress.    Appearance: Normal appearance. She is well-developed and normal weight. She is not ill-appearing or diaphoretic.  HENT:     Head: Normocephalic and atraumatic.     Right Ear: Tympanic membrane, ear canal and external ear normal.      Left Ear: Tympanic membrane, ear canal and external ear normal.     Nose: Nose normal. No congestion.     Mouth/Throat:     Mouth: Mucous membranes are moist.     Pharynx: Oropharynx is clear. No posterior oropharyngeal erythema.  Eyes:     General: No scleral icterus.    Extraocular Movements: Extraocular movements intact.     Conjunctiva/sclera: Conjunctivae normal.     Pupils: Pupils are equal, round, and reactive to light.  Neck:     Thyroid : No thyromegaly.     Vascular: No carotid bruit or JVD.  Cardiovascular:     Rate and Rhythm: Normal rate and regular rhythm.     Pulses: Normal pulses.     Heart sounds: Normal heart sounds.     No gallop.  Pulmonary:     Effort: Pulmonary effort is normal. No respiratory distress.  Breath sounds: Normal breath sounds. No wheezing.     Comments: Good air exch Chest:     Chest wall: No tenderness.  Abdominal:     General: Bowel sounds are normal. There is no distension or abdominal bruit.     Palpations: Abdomen is soft. There is no mass.     Tenderness: There is no abdominal tenderness.     Hernia: No hernia is present.  Genitourinary:    Comments: Pt declines breast exam Musculoskeletal:        General: No tenderness. Normal range of motion.     Cervical back: Normal range of motion and neck supple. No rigidity. No muscular tenderness.     Right lower leg: No edema.     Left lower leg: No edema.     Comments: No kyphosis   Lymphadenopathy:     Cervical: No cervical adenopathy.  Skin:    General: Skin is warm and dry.     Coloration: Skin is not pale.     Findings: No erythema or rash.     Comments: Solar lentigines diffusely Solar aging Some sks  Scar on RLL is less red and raised   Neurological:     Mental Status: She is alert. Mental status is at baseline.     Cranial Nerves: No cranial nerve deficit.     Motor: No abnormal muscle tone.     Coordination: Coordination normal.     Gait: Gait normal.     Deep Tendon  Reflexes: Reflexes are normal and symmetric. Reflexes normal.  Psychiatric:        Mood and Affect: Mood normal.        Cognition and Memory: Cognition and memory normal.           Assessment & Plan:   Problem List Items Addressed This Visit       Cardiovascular and Mediastinum   Essential hypertension   bp in fair control at this time -improved BP Readings from Last 1 Encounters:  04/11/24 130/74   No changes needed Most recent labs reviewed  Disc lifstyle change with low sodium diet and exercise   Continues  Metoprolol  12.5 mg bid  Amlodipine  5 mg daily   Pt continues to monitor for high and low readings  Reviewed recent note from Dr Jimmy        Relevant Medications   metoprolol  tartrate (LOPRESSOR ) 25 MG tablet   amLODipine  (NORVASC ) 5 MG tablet     Digestive   GERD   With history of barrett's esophagus  On omeprazole  40 mg daily   Carafate  prn  Under GI care  B12 and D levels in normal range         Endocrine   Hypothyroidism   Hypothyroidism  Pt has no clinical changes No change in energy level/ hair or skin/ edema and no tremor Lab Results  Component Value Date   TSH 2.20 03/17/2024     Taking levothyroxine  75 mcg daily         Relevant Medications   metoprolol  tartrate (LOPRESSOR ) 25 MG tablet     Musculoskeletal and Integument   Osteopenia   Due for dexa in sept 2025- ordered and pt will schedule Had fall and hip fracture this year  Taking vit D and tums   Discussed fall prevention, supplements and exercise for bone density   Exercise as tolerated with walker currently      Closed right hip fracture (HCC)   Still dealing with  some discomfort /healing issues Using ultrasound therapeutically  Still using walker to be safe  Getting stronger         Other   Vitamin D  deficiency   Last vitamin D  Lab Results  Component Value Date   VD25OH 38.31 03/17/2024   Noted importance to bone and overall health       Routine  general medical examination at a health care facility - Primary   Reviewed health habits including diet and exercise and skin cancer prevention Reviewed appropriate screening tests for age  Also reviewed health mt list, fam hx and immunization status , as well as social and family history   See HPI Labs reviewed and ordered Health Maintenance  Topic Date Due   Mammogram  03/04/2024   COVID-19 Vaccine (5 - 2024-25 season) 08/23/2024*   Flu Shot  11/18/2024*   Medicare Annual Wellness Visit  04/11/2025   DEXA scan (bone density measurement)  04/28/2025   DTaP/Tdap/Td vaccine (4 - Td or Tdap) 01/29/2033   Pneumococcal Vaccine for age over 69  Completed   Zoster (Shingles) Vaccine  Completed   HPV Vaccine  Aged Out   Meningitis B Vaccine  Aged Out  *Topic was postponed. The date shown is not the original due date.    Mammogram and dexa ordered  Discussed fall prevention, supplements and exercise for bone density   PHQ 1 May consider PT for balance once she is done with treatments for this hip       Hyperlipidemia   Disc goals for lipids and reasons to control them Rev last labs with pt Rev low sat fat diet in detail Eating well  Both LDL and HDL went up (HDL in 80s)  Eats ice cream-not willing to give that up      Relevant Medications   metoprolol  tartrate (LOPRESSOR ) 25 MG tablet   amLODipine  (NORVASC ) 5 MG tablet   GAD (generalized anxiety disorder)   Doing better overall       Estrogen deficiency   Dexa ordered       Relevant Orders   DG Bone Density   Encounter for screening mammogram for breast cancer   Mammogram ordered Pt will call to schedule  Declines breast exam      Relevant Orders   MM 3D SCREENING MAMMOGRAM BILATERAL BREAST   Current use of proton pump inhibitor   Lab Results  Component Value Date   VITAMINB12 650 03/17/2024   Last vitamin D  Lab Results  Component Value Date   VD25OH 38.31 03/17/2024

## 2024-04-11 NOTE — Assessment & Plan Note (Signed)
 Dexa ordered

## 2024-04-11 NOTE — Assessment & Plan Note (Signed)
 Disc goals for lipids and reasons to control them Rev last labs with pt Rev low sat fat diet in detail Eating well  Both LDL and HDL went up (HDL in 80s)  Eats ice cream-not willing to give that up

## 2024-04-11 NOTE — Patient Instructions (Addendum)
 Stay as active as you can safely be   Eat a healthy balanced diet  Make sure to get enough protein    You have an order for:  [x]   3D Mammogram  [x]   Bone Density     Please call for appointment:   [x]   [facility]  [address] [place] [address]  [phone]  []   [facility])   [address]  [place], [place] [address]  [phone]  []   [facility]      [address] [place], [place]        [phone]         []   [facility]  [address] [place], [place]  [phone]  []  [facility]   [address]   [place], [place] [address]  [phone]  []  [facility]  [address] [place], [place] [address] [phone]    Make sure to wear two piece clothing  No lotions powders or deodorants the day of the appointment Make sure to bring picture ID and insurance card.  Bring list of medications you are currently taking including any supplements.   Schedule your screening mammogram through MyChart!   Select [place] imaging sites can now be scheduled through MyChart.  Log into your MyChart account.  Go to 'Visit' (or 'Appointments' if  on mobile App) --> Schedule an  Appointment  Under 'Select a Reason for Visit' choose the Mammogram  Screening option.  Complete the pre-visit questions  and select the time and place that  best fits your schedule

## 2024-04-11 NOTE — Assessment & Plan Note (Signed)
 Reviewed health habits including diet and exercise and skin cancer prevention Reviewed appropriate screening tests for age  Also reviewed health mt list, fam hx and immunization status , as well as social and family history   See HPI Labs reviewed and ordered Health Maintenance  Topic Date Due   Mammogram  03/04/2024   COVID-19 Vaccine (5 - 2024-25 season) 08/23/2024*   Flu Shot  11/18/2024*   Medicare Annual Wellness Visit  04/11/2025   DEXA scan (bone density measurement)  04/28/2025   DTaP/Tdap/Td vaccine (4 - Td or Tdap) 01/29/2033   Pneumococcal Vaccine for age over 31  Completed   Zoster (Shingles) Vaccine  Completed   HPV Vaccine  Aged Out   Meningitis B Vaccine  Aged Out  *Topic was postponed. The date shown is not the original due date.    Mammogram and dexa ordered  Discussed fall prevention, supplements and exercise for bone density   PHQ 1 May consider PT for balance once she is done with treatments for this hip

## 2024-04-11 NOTE — Assessment & Plan Note (Signed)
 Hypothyroidism  Pt has no clinical changes No change in energy level/ hair or skin/ edema and no tremor Lab Results  Component Value Date   TSH 2.20 03/17/2024     Taking levothyroxine  75 mcg daily

## 2024-04-11 NOTE — Assessment & Plan Note (Addendum)
 bp in fair control at this time -improved BP Readings from Last 1 Encounters:  04/11/24 130/74   No changes needed Most recent labs reviewed  Disc lifstyle change with low sodium diet and exercise   Continues  Metoprolol  12.5 mg bid  Amlodipine  5 mg daily   Pt continues to monitor for high and low readings  Reviewed recent note from Dr Letvak

## 2024-04-11 NOTE — Assessment & Plan Note (Signed)
 Much improved

## 2024-04-11 NOTE — Assessment & Plan Note (Signed)
 Due for dexa in sept 2025- ordered and pt will schedule Had fall and hip fracture this year  Taking vit D and tums   Discussed fall prevention, supplements and exercise for bone density   Exercise as tolerated with walker currently

## 2024-04-11 NOTE — Assessment & Plan Note (Signed)
 Still dealing with some discomfort /healing issues Using ultrasound therapeutically  Still using walker to be safe  Getting stronger

## 2024-04-11 NOTE — Assessment & Plan Note (Signed)
 Lab Results  Component Value Date   VITAMINB12 650 03/17/2024   Last vitamin D  Lab Results  Component Value Date   VD25OH 38.31 03/17/2024

## 2024-04-11 NOTE — Assessment & Plan Note (Signed)
 Last vitamin D  Lab Results  Component Value Date   VD25OH 38.31 03/17/2024   Noted importance to bone and overall health

## 2024-04-11 NOTE — Assessment & Plan Note (Signed)
 Doing better overall.

## 2024-04-15 ENCOUNTER — Ambulatory Visit

## 2024-04-17 ENCOUNTER — Ambulatory Visit

## 2024-04-22 ENCOUNTER — Ambulatory Visit

## 2024-04-24 ENCOUNTER — Encounter: Payer: Self-pay | Admitting: Family Medicine

## 2024-04-24 ENCOUNTER — Ambulatory Visit

## 2024-05-01 ENCOUNTER — Ambulatory Visit

## 2024-05-08 ENCOUNTER — Ambulatory Visit

## 2024-05-08 ENCOUNTER — Other Ambulatory Visit: Payer: Self-pay | Admitting: Family Medicine

## 2024-05-12 ENCOUNTER — Telehealth: Payer: Self-pay

## 2024-05-12 NOTE — Telephone Encounter (Signed)
 Lvm to schedule for Nurse visit for flu shot

## 2024-05-12 NOTE — Telephone Encounter (Signed)
 Copied from CRM #[id number]. Topic: Appointments - Scheduling Inquiry for Clinic >> May 12, 2024 10:36 AM Jasmin G wrote: Reason for CRM: Pt called to schedule appt for flu shot, call her back at [phone] to schedule.

## 2024-05-15 ENCOUNTER — Ambulatory Visit (INDEPENDENT_AMBULATORY_CARE_PROVIDER_SITE_OTHER)

## 2024-05-15 ENCOUNTER — Ambulatory Visit: Payer: Self-pay | Admitting: Family Medicine

## 2024-05-15 ENCOUNTER — Ambulatory Visit
Admission: RE | Admit: 2024-05-15 | Discharge: 2024-05-15 | Disposition: A | Discharge status: Home / Self Care | Source: Ambulatory Visit | Attending: Family Medicine | Admitting: Family Medicine

## 2024-05-15 DIAGNOSIS — E2839 Other primary ovarian failure: Secondary | ICD-10-CM | POA: Insufficient documentation

## 2024-05-15 DIAGNOSIS — Z23 Encounter for immunization: Secondary | ICD-10-CM | POA: Diagnosis not present

## 2024-05-15 DIAGNOSIS — Z1231 Encounter for screening mammogram for malignant neoplasm of breast: Secondary | ICD-10-CM | POA: Insufficient documentation

## 2024-05-19 ENCOUNTER — Other Ambulatory Visit: Payer: Self-pay | Admitting: Family Medicine

## 2024-05-19 DIAGNOSIS — R928 Other abnormal and inconclusive findings on diagnostic imaging of breast: Secondary | ICD-10-CM

## 2024-05-20 ENCOUNTER — Encounter: Payer: Self-pay | Admitting: Family Medicine

## 2024-05-20 ENCOUNTER — Ambulatory Visit (INDEPENDENT_AMBULATORY_CARE_PROVIDER_SITE_OTHER): Admitting: Family Medicine

## 2024-05-20 VITALS — BP 132/70 | HR 56 | Temp 98.5°F | Ht 64.0 in | Wt 124.0 lb

## 2024-05-20 DIAGNOSIS — M8589 Other specified disorders of bone density and structure, multiple sites: Secondary | ICD-10-CM

## 2024-05-20 MED ORDER — ALENDRONATE SODIUM 70 MG PO TABS: 70.0000 mg | ORAL_TABLET | ORAL | 3 refills | Status: AC

## 2024-05-20 NOTE — Progress Notes (Signed)
 Subjective:    Patient ID: Sarah Harper, female    DOB: 26-Dec-1940, 83 y.o.   MRN: [id number]  HPI  Wt Readings from Last 3 Encounters:  05/20/24 124 lb (56.2 kg)  04/11/24 122 lb (55.3 kg)  02/28/24 122 lb (55.3 kg)   21.28 kg/m  Vitals:   05/20/24 1056  BP: 132/70  Pulse: (!) 56  Temp: 98.5 F (36.9 C)  SpO2: 98%    Pt presents for follow up of bone density    Dexa 05/15/24 LFN T score -1.8 Left total hip -2.1 Forearm -2.4 (down)  Frax maj osteoporosis fracture risk 32.2% Hip fractures 20.2%  Personal history of hip fracture with surgery in January   Sister has osteoporosis  Has reclast   Has appointment with dentist for cleaning in oct  Not major work lately    Lab Results  Component Value Date   NA 138 03/17/2024   NA 138 03/17/2024   K 4.0 03/17/2024   K 4.0 03/17/2024   CO2 32 03/17/2024   CO2 32 03/17/2024   GLUCOSE 115 (H) 03/17/2024   GLUCOSE 115 (H) 03/17/2024   BUN 13 03/17/2024   BUN 13 03/17/2024   CREATININE 0.67 03/17/2024   CREATININE 0.67 03/17/2024   CALCIUM  9.8 03/17/2024   CALCIUM  9.8 03/17/2024   GFR 81.20 03/17/2024   GFR 81.20 03/17/2024   EGFR 82 12/08/2020   GFRNONAA >60 02/05/2024    Last vitamin D  Lab Results  Component Value Date   VD25OH 38.31 03/17/2024   Almost no exercise  Is using an exercise band from PT   Still taking us  treatments  Also starting back with PT soon   Using walker when not in the house     Patient Active Problem List   Diagnosis Date Noted   Vitamin D  deficiency 01/29/2024   Closed right hip fracture (HCC) 09/05/2023   Current use of proton pump inhibitor 03/13/2023   Pain in joint, lower leg 01/30/2023   Migraine    Essential hypertension    H/O: CVA (cerebrovascular accident) 11/08/2020   Colon cancer screening 02/05/2018   Degenerative joint disease (DJD) of lumbar spine 05/02/2017   Internal hemorrhoids 05/02/2017   Generalized osteoarthritis of hand 12/11/2016    GAD (generalized anxiety disorder) 12/11/2016   Estrogen deficiency 05/11/2016   Encounter for screening mammogram for breast cancer 05/11/2016   OSA (obstructive sleep apnea) 03/06/2016   Left knee pain 10/18/2015   Snoring 10/18/2015   Routine general medical examination at a health care facility 02/28/2015   Hyperlipidemia 08/27/2012   Osteopenia 12/17/2009   BACK PAIN, LUMBAR 07/02/2009   IRRITABLE BOWEL SYNDROME 04/02/2009   Asymptomatic postmenopausal status 12/10/2008   Hypothyroidism 12/05/2007   Meniere's disease 12/05/2007   GERD 12/05/2007   Past Medical History:  Diagnosis Date   Anxiety 2022   Arthritis    Blood loss anemia 09/13/2023   Cataract 1997   Colon cancer screening 02/05/2018   DDD (degenerative disc disease)    chronic back pain   ETD (eustachian tube dysfunction)    GERD (gastroesophageal reflux disease)    Hypertension 2022   IBS (irritable bowel syndrome)    Meniere's disease    Migraine    still gets visual aura from time to time   Mild cognitive impairment    Osteoporosis    Sleep apnea 2017   CPAP   Stroke (HCC) 2022   Syncopal episodes    after work-up - possible seizures?  Thyroid  disease 1990   hypothyroid   TIA (transient ischemic attack) 11/09/2020   Past Surgical History:  Procedure Laterality Date   APPENDECTOMY     CATARACT EXTRACTION W/PHACO Right 05/21/2019   Procedure: CATARACT EXTRACTION PHACO AND INTRAOCULAR LENS PLACEMENT (IOC) RIGHT panoptix lens  00:35.0  21.7%  7.61;  Surgeon: Mittie Gaskin, MD;  Location: [facility];  Service: Ophthalmology;  Laterality: Right;  sleep apnea requests early   CATARACT EXTRACTION W/PHACO Left 06/11/2019   Procedure: CATARACT EXTRACTION PHACO AND INTRAOCULAR LENS PLACEMENT (IOC) LEFT PANOPTIX TORIC LENS  00:50.0  20.3%  10.21;  Surgeon: Mittie Gaskin, MD;  Location: [facility];  Service: Ophthalmology;  Laterality: Left;  sleep apnea requests early    COLONOSCOPY     last 2012 - [facility]   ESOPHAGOGASTRODUODENOSCOPY     Multiple, last 01/07/2017 with Savary dilation to 18 mm   EYE SURGERY  August 2022   Cataracs removed   FRACTURE SURGERY  2025   HEMORRHOID BANDING  2018   [facility]   INTRAMEDULLARY (IM) NAIL INTERTROCHANTERIC Right 09/06/2023   Procedure: INTRAMEDULLARY (IM) NAIL INTERTROCHANTERIC;  Surgeon: Celena Sharper, MD;  Location: [facility];  Service: Orthopedics;  Laterality: Right;   LUMBAR DISC SURGERY  1984   L5    Social History   Tobacco Use   Smoking status: Never    Passive exposure: Never   Smokeless tobacco: Never  Vaping Use   Vaping status: Never Used  Substance Use Topics   Alcohol use: Yes    Comment: Average 1 glass of wine/week   Drug use: Not Currently    Types: Marijuana    Comment: CBD drops   Family History  Problem Relation Age of Onset   Arthritis Mother    Varicose Veins Mother    Diabetes Paternal Aunt    Breast cancer Paternal Aunt    Hypertension Maternal Grandmother    Hypertension Maternal Grandfather    Hypertension Sister    Asthma Sister    Asthma Sister    Diabetes Paternal Aunt    Diabetes Paternal Aunt    Allergies  Allergen Reactions   Aspirin  Other (See Comments)    GI intolerance    Nsaids Other (See Comments)    GI discomfort and gastritis   Tolmetin Other (See Comments)    GI discomfort and gastritis   Current Outpatient Medications on File Prior to Visit  Medication Sig Dispense Refill   acetaminophen  (TYLENOL ) 325 MG tablet Take 2 tablets (650 mg total) by mouth every 6 (six) hours as needed for mild pain (pain score 1-3) or fever (or Fever >/= 101).     amLODipine  (NORVASC ) 5 MG tablet Take 1/2 tablet (2.5 mg) by mouth daily 45 tablet 3   B Complex-C (SUPER B COMPLEX PO) Take 1 tablet by mouth daily.     Calcium  Carbonate Antacid (TUMS PO) Take 1,000 mg by mouth daily.     cholecalciferol  (VITAMIN D ) 25 MCG (1000 UNIT) tablet Take 1,000 Units by mouth daily.      Cranberry 250 MG CAPS Take 1 capsule by mouth daily.     diclofenac  sodium (VOLTAREN ) 1 % GEL APPLY 2 GRAMS TOPICALLY FOUR TIMES A DAY TO AFFECTED AREAS (Patient taking differently: Apply 2 g topically 2 (two) times daily.) 300 g 3   levothyroxine  (SYNTHROID ) 75 MCG tablet TAKE 1 TABLET BY MOUTH EVERY DAY BEFORE BREAKFAST 90 tablet 2   LORazepam  (ATIVAN ) 0.5 MG tablet Take 1 tablet (0.5 mg  total) by mouth daily as needed for anxiety. 10 tablet 0   metoprolol  tartrate (LOPRESSOR ) 25 MG tablet Take 0.5 tablets (12.5 mg total) by mouth 2 (two) times daily. 90 tablet 3   omeprazole  (PRILOSEC) 40 MG capsule TAKE 1 CAPSULE (40 MG TOTAL) BY MOUTH DAILY. 90 capsule 2   sucralfate  (CARAFATE ) 1 g tablet Take 1 g by mouth 4 (four) times daily as needed (Esophagitis).     No current facility-administered medications on file prior to visit.    Review of Systems  Constitutional:  Negative for activity change, appetite change, fatigue, fever and unexpected weight change.  HENT:  Negative for congestion, ear pain, rhinorrhea, sinus pressure and sore throat.   Eyes:  Negative for pain, redness and visual disturbance.  Respiratory:  Negative for cough, shortness of breath and wheezing.   Cardiovascular:  Negative for chest pain and palpitations.  Gastrointestinal:  Negative for abdominal pain, blood in stool, constipation and diarrhea.  Endocrine: Negative for polydipsia and polyuria.  Genitourinary:  Negative for dysuria, frequency and urgency.  Musculoskeletal:  Positive for arthralgias. Negative for back pain and myalgias.       Hip pain  Gradually getting better   Skin:  Negative for pallor and rash.  Allergic/Immunologic: Negative for environmental allergies.  Neurological:  Negative for dizziness, syncope and headaches.  Hematological:  Negative for adenopathy. Does not bruise/bleed easily.  Psychiatric/Behavioral:  Negative for decreased concentration and dysphoric mood. The patient is not  nervous/anxious.        Objective:   Physical Exam Constitutional:      General: She is not in acute distress.    Appearance: Normal appearance. She is normal weight. She is not ill-appearing or diaphoretic.  Cardiovascular:     Rate and Rhythm: Normal rate.  Pulmonary:     Effort: Pulmonary effort is normal. No respiratory distress.     Breath sounds: Normal breath sounds.  Musculoskeletal:     Comments: No kyphosis  Petite frame   Skin:    Coloration: Skin is not pale.     Findings: No erythema or rash.  Neurological:     Mental Status: She is alert.  Psychiatric:        Mood and Affect: Mood normal.           Assessment & Plan:   Problem List Items Addressed This Visit       Musculoskeletal and Integument   Osteopenia - Primary   Borderline osteoporosis  T score -2.4 in forearm  Elevated frax score 32.2 % 10 y maj fracture risk  Had hip fracture in January (no other fractures) On vit D - D level is in normal range  Exercise- PT currently ,discussed chair programs  Pt is very motivated Discussed opt for treatment  Will start with alendronate 70 mg weekly  Discussed way to take and possible side effects Handout on med and bone health  Instructed to call if any side effects  Discussed fall prevention

## 2024-05-20 NOTE — Patient Instructions (Addendum)
 Add some strength training to your routine, this is important for bone and brain health and can reduce your risk of falls and help your body use insulin properly and regulate weight  Light weights, exercise bands , and internet videos are a good way to start  Yoga (chair or regular), machines , floor exercises or a gym with machines are also good options   Any chair based program wound be helpful for you   Try the weekly alendronate for bone density  If any intolerable side effects stop it and call If you need an y major dental work- your dentist will have you hold it for a specified amount of time   Goal is 5 years of treatment to start

## 2024-05-20 NOTE — Assessment & Plan Note (Addendum)
 Borderline osteoporosis  T score -2.4 in forearm  Elevated frax score 32.2 % 10 y maj fracture risk  Had hip fracture in January (no other fractures) On vit D - D level is in normal range  Exercise- PT currently ,discussed chair programs  Pt is very motivated Ca level normal / renal labs normal  Discussed opt for treatment  Will start with alendronate 70 mg weekly  Discussed way to take and possible side effects Handout on med and bone health  Instructed to call if any side effects  Discussed fall prevention

## 2024-05-22 ENCOUNTER — Ambulatory Visit
Admission: RE | Admit: 2024-05-22 | Discharge: 2024-05-22 | Disposition: A | Discharge status: Home / Self Care | Source: Ambulatory Visit | Attending: Family Medicine | Admitting: Family Medicine

## 2024-05-22 ENCOUNTER — Ambulatory Visit: Payer: Self-pay | Admitting: Family Medicine

## 2024-05-22 DIAGNOSIS — R928 Other abnormal and inconclusive findings on diagnostic imaging of breast: Secondary | ICD-10-CM | POA: Diagnosis present

## 2024-05-30 ENCOUNTER — Ambulatory Visit: Admitting: Podiatry

## 2024-05-30 DIAGNOSIS — B351 Tinea unguium: Secondary | ICD-10-CM | POA: Diagnosis not present

## 2024-05-30 DIAGNOSIS — M79675 Pain in left toe(s): Secondary | ICD-10-CM

## 2024-05-30 DIAGNOSIS — M79674 Pain in right toe(s): Secondary | ICD-10-CM

## 2024-06-02 ENCOUNTER — Encounter: Payer: Self-pay | Admitting: Family Medicine

## 2024-06-02 DIAGNOSIS — M8589 Other specified disorders of bone density and structure, multiple sites: Secondary | ICD-10-CM

## 2024-06-06 ENCOUNTER — Encounter: Payer: Self-pay | Admitting: Family Medicine

## 2024-06-07 ENCOUNTER — Encounter: Payer: Self-pay | Admitting: Podiatry

## 2024-06-07 NOTE — Progress Notes (Signed)
  Subjective:  Patient ID: Sarah Harper, female    DOB: [birth year],  MRN: [id number]  83 y.o. female presents painful mycotic toenails of both feet that are difficult to trim. Pain interferes with daily activities and wearing enclosed shoe gear comfortably. Chief Complaint  Patient presents with   Toe Pain    RFC. Dr. Randeen is her PCP. Last visit was in Sept   New problem(s): None   PCP is Tower, Laine LABOR, MD.  Allergies  Allergen Reactions   Aspirin  Other (See Comments)    GI intolerance    Nsaids Other (See Comments)    GI discomfort and gastritis   Tolmetin Other (See Comments)    GI discomfort and gastritis    Review of Systems: Negative except as noted in the HPI.   Objective:  Sarah Harper is a pleasant 83 y.o. female WD, WN in NAD. AAO x 3.  Vascular Examination: Vascular status intact b/l with palpable pedal pulses. CFT immediate b/l. Pedal hair present. No edema. No pain with calf compression b/l. Skin temperature gradient WNL b/l. No varicosities noted. No cyanosis or clubbing noted.  Neurological Examination: Sensation grossly intact b/l with 10 gram monofilament. Vibratory sensation intact b/l.  Dermatological Examination: Pedal skin with normal turgor, texture and tone b/l. No open wounds nor interdigital macerations noted. Toenails 1-5 b/l thick, discolored, elongated with subungual debris and pain on dorsal palpation. No hyperkeratotic lesions noted b/l.   Musculoskeletal Examination: Muscle strength 5/5 to b/l LE.  No pain, crepitus noted b/l. HAV with bunion deformity b/l.  Radiographs: None   Assessment:   1. Pain due to onychomycosis of toenails of both feet    Plan:  Patient was evaluated and treated. All patient's and/or POA's questions/concerns addressed on today's visit. Toenails 1-5 b/l debrided in length and girth without incident. Continue soft, supportive shoe gear daily. Report any pedal injuries to medical professional. Call office if  there are any questions/concerns. -Patient/POA to call should there be question/concern in the interim.  Return in about 3 months (around 08/30/2024).  Delon LITTIE Merlin, DPM      [facility] LOCATION: [address] [place] [address]                   Office [phone]   [facility] LOCATION: [address] [place], [place] [address] Office [phone]

## 2024-06-10 ENCOUNTER — Ambulatory Visit: Attending: Orthopedic Surgery

## 2024-06-10 DIAGNOSIS — R269 Unspecified abnormalities of gait and mobility: Secondary | ICD-10-CM | POA: Insufficient documentation

## 2024-06-10 DIAGNOSIS — R2689 Other abnormalities of gait and mobility: Secondary | ICD-10-CM | POA: Diagnosis present

## 2024-06-10 DIAGNOSIS — M6281 Muscle weakness (generalized): Secondary | ICD-10-CM | POA: Insufficient documentation

## 2024-06-10 DIAGNOSIS — R278 Other lack of coordination: Secondary | ICD-10-CM | POA: Diagnosis present

## 2024-06-10 DIAGNOSIS — R262 Difficulty in walking, not elsewhere classified: Secondary | ICD-10-CM | POA: Insufficient documentation

## 2024-06-10 DIAGNOSIS — M25551 Pain in right hip: Secondary | ICD-10-CM | POA: Diagnosis present

## 2024-06-10 NOTE — Therapy (Signed)
 OUTPATIENT PHYSICAL THERAPY LOWER EXTREMITY EVALUATION   Patient Name: Sarah Harper MRN: [id number] DOB:1941/04/04, 83 y.o., female Today's Date: 06/11/2024  END OF SESSION:  PT End of Session - 06/11/24 0727     Visit Number 1    Number of Visits 24    Date for Recertification  09/03/23    Progress Note Due on Visit 10    PT Start Time 1145    PT Stop Time 1230    PT Time Calculation (min) 45 min    Equipment Utilized During Treatment Gait belt    Activity Tolerance Patient tolerated treatment well    Behavior During Therapy [facility] for tasks assessed/performed          Past Medical History:  Diagnosis Date   Anxiety 2022   Arthritis    Blood loss anemia 09/13/2023   Cataract 1997   Colon cancer screening 02/05/2018   DDD (degenerative disc disease)    chronic back pain   ETD (eustachian tube dysfunction)    GERD (gastroesophageal reflux disease)    Hypertension 2022   IBS (irritable bowel syndrome)    Meniere's disease    Migraine    still gets visual aura from time to time   Mild cognitive impairment    Osteoporosis    Sleep apnea 2017   CPAP   Stroke (HCC) 2022   Syncopal episodes    after work-up - possible seizures?   Thyroid  disease 1990   hypothyroid   TIA (transient ischemic attack) 11/09/2020   Past Surgical History:  Procedure Laterality Date   APPENDECTOMY     CATARACT EXTRACTION W/PHACO Right 05/21/2019   Procedure: CATARACT EXTRACTION PHACO AND INTRAOCULAR LENS PLACEMENT (IOC) RIGHT panoptix lens  00:35.0  21.7%  7.61;  Surgeon: Mittie Gaskin, MD;  Location: [facility];  Service: Ophthalmology;  Laterality: Right;  sleep apnea requests early   CATARACT EXTRACTION W/PHACO Left 06/11/2019   Procedure: CATARACT EXTRACTION PHACO AND INTRAOCULAR LENS PLACEMENT (IOC) LEFT PANOPTIX TORIC LENS  00:50.0  20.3%  10.21;  Surgeon: Mittie Gaskin, MD;  Location: [facility];  Service: Ophthalmology;  Laterality: Left;  sleep  apnea requests early   COLONOSCOPY     last 2012 - [facility]   ESOPHAGOGASTRODUODENOSCOPY     Multiple, last 01/07/2017 with Savary dilation to 18 mm   EYE SURGERY  August 2022   Cataracs removed   FRACTURE SURGERY  2025   HEMORRHOID BANDING  2018   [facility]   INTRAMEDULLARY (IM) NAIL INTERTROCHANTERIC Right 09/06/2023   Procedure: INTRAMEDULLARY (IM) NAIL INTERTROCHANTERIC;  Surgeon: Celena Sharper, MD;  Location: [facility] OR;  Service: Orthopedics;  Laterality: Right;   LUMBAR DISC SURGERY  1984   L5    Patient Active Problem List   Diagnosis Date Noted   Vitamin D  deficiency 01/29/2024   Closed right hip fracture (HCC) 09/05/2023   Current use of proton pump inhibitor 03/13/2023   Pain in joint, lower leg 01/30/2023   Migraine    Essential hypertension    H/O: CVA (cerebrovascular accident) 11/08/2020   Colon cancer screening 02/05/2018   Degenerative joint disease (DJD) of lumbar spine 05/02/2017   Internal hemorrhoids 05/02/2017   Generalized osteoarthritis of hand 12/11/2016   GAD (generalized anxiety disorder) 12/11/2016   Estrogen deficiency 05/11/2016   Encounter for screening mammogram for breast cancer 05/11/2016   OSA (obstructive sleep apnea) 03/06/2016   Left knee pain 10/18/2015   Snoring 10/18/2015   Routine general medical examination at a  health care facility 02/28/2015   Hyperlipidemia 08/27/2012   Osteopenia 12/17/2009   BACK PAIN, LUMBAR 07/02/2009   IRRITABLE BOWEL SYNDROME 04/02/2009   Asymptomatic postmenopausal status 12/10/2008   Hypothyroidism 12/05/2007   Meniere's disease 12/05/2007   GERD 12/05/2007    PCP: Dr. Laine Balls  REFERRING PROVIDER: Dr. Ozell Bruch  REFERRING DIAG: R hip fracture  THERAPY DIAG:  Abnormality of gait and mobility - Plan: PT plan of care cert/re-cert  Difficulty in walking, not elsewhere classified - Plan: PT plan of care cert/re-cert  Muscle weakness (generalized) - Plan: PT plan of care cert/re-cert  Other  abnormalities of gait and mobility - Plan: PT plan of care cert/re-cert  Pain in right hip - Plan: PT plan of care cert/re-cert  Other lack of coordination - Plan: PT plan of care cert/re-cert  Rationale for Evaluation and Treatment: Rehabilitation  ONSET DATE: 09/06/2023  SUBJECTIVE:   SUBJECTIVE STATEMENT: Patient reports feeling better overall and states her MD wants her back in PT to work her out and strengthen her hip and work on balance. She denies any recent falls and reports her hip is feeling better since she was in PT back in June.   PERTINENT HISTORY: Sarah Harper is a 83 year old female with past medical history significant for HTN, hypothyroidism, GERD, osteopenia, dyslipidemia, OSA who presented to [facility] ED on 1/15 fall mechanical fall at home sustaining injury to her right hip. Patient underwent intramedullary nailing of the right hip 09/06/2023 by Dr. Bruch. Patient was receiving outpatient PT in May however was admitted back to hospital with Hyponatremia and acute metabolic encephalopathy. She has continued to be followed since release from hospital by Dr. Bruch for ongoing hip pain and received a bone stimulator/ultrasound device to help with non-healing hip and -Ultrasound/bone stimulator treatments 2 sets of 6 week treatment. Has competed 80/90 treatemnt and has f/u with Dr. Bruch on 10/29.  PAIN:  Are you having pain? Yes: NPRS scale: 2/10  Pain location: Right posterior superior gluteal Pain description: aggravating achy- constant Aggravating factors: Worse with ultrasound treatment Relieving factors: lying flat  PRECAUTIONS: Other: WBAT RLE and no restrictions per MD order  RED FLAGS: None   WEIGHT BEARING RESTRICTIONS: R LE WBAT  FALLS:  Has patient fallen in last 6 months? No  LIVING ENVIRONMENT: Lives with: lives with their spouse Lives in: House/apartment Stairs: Yes: Internal: 2 steps; on right going up Has following equipment at home: Vannie - 2  wheeled and Grab bars  OCCUPATION: Stage manager  PLOF: Independent  PATIENT GOALS: Hope to be able to walk on my own without a device and be able to drive independently  NEXT MD VISIT: 06/18/2024- Dr. Bruch  OBJECTIVE:  Note: Objective measures were completed at Evaluation unless otherwise noted.  DIAGNOSTIC FINDINGS:  ADDENDUM REPORT: 03/05/2024 08:07   ADDENDUM: The original report was by Dr. Ryan Salvage. The following addendum is by Dr. Ryan Salvage:   With respect to further specifics on the intertrochanteric fracture, the original fracture as shown on the radiographs from 09/05/2023 does include a small comminuted avulsion of the lesser trochanter, with a component measuring 2.4 by 1.0 by 0.6 cm, shown for example on image 63 series 5 and image 102 series 2. It is conceivable that the lucency described as likely hyperemic could have been a donor site for this fragment, although the fragment is about 1.9 cm posterior to this lucency, and the natural action of the iliopsoas would tend to pull the fragment  anteriorly and cephalad rather than posteriorly.   With respect to infection as a cause for the lucency, I am skeptical given the lack of abnormal lucency extending along the hardware, although if the patient is experiencing fever and signs of infection then close monitoring would be recommended.     Electronically Signed   By: Ryan Salvage M.D.   On: 03/05/2024 08:07    Addended by Salvage Sawyers, MD on 03/05/2024  8:09 AM    Study Result  Narrative & Impression  CLINICAL DATA:  Right SI joint pain. Prior right hip intertrochanteric fracture ORIF in January 2025.   EXAM: CT PELVIS WITHOUT CONTRAST   TECHNIQUE: Multidetector CT imaging of the pelvis was performed following the standard protocol without intravenous contrast.   RADIATION DOSE REDUCTION: This exam was performed according to the departmental dose-optimization program  which includes automated exposure control, adjustment of the mA and/or kV according to patient size and/or use of iterative reconstruction technique.   COMPARISON:  Multiple exams, including radiographs 09/05/2023   FINDINGS: OSSEOUS STRUCTURES AND JOINTS:   Lower lumbar degenerative facet arthropathy. No SI joint diastasis. A small amount of nitrogen gas phenomenon in both SI joints without SI joint effusion observed. No erosions or other specific abnormalities along the SI joints aside from mild spurring.   No pelvic fracture observed.   Right femoral nail system in place with single distal interlocking screw and femoral head screw noted. This traverses the intertrochanteric fracture which is still visible, with notable lucency along part of the medial fracture margin on image 52 series 5 and image 99 series 2 probably from hyperemic resorption of bone in this local vicinity. There is potentially some early bridging of bone anteriorly at the fracture site. Early cortication along the posterior portion of the fracture may suggest early nonunion of the posterior portion for example as on image 62 series 5.   No abnormal lucency around the components of the nail system to indicate loosening or infection.   MUSCULOTENDINOUS:   Unremarkable   SACRAL PLEXUS:   No impinging lesion along the sacral plexus or proximal sciatic nerves.   OTHER:   Prominent stool throughout the colon favors constipation. Mild degenerative arthropathy of the left (contralateral) hip.   IMPRESSION: 1. No specific abnormality is identified to explain the patient's right SI joint pain. 2. Right femoral nail system in place traversing the intertrochanteric fracture. Questionable partial early nonunion of the posterior portion of the fracture anterior bridging is appreciated. There is some accentuated lucency along the medial portion of the fracture probably from hyperemic resorption of bone, but  no abnormal extension of lucency along components of the IM nail or femoral head nail to suggest loosening/infection. 3. Prominent stool throughout the colon favors constipation. 4. Mild degenerative arthropathy of the left (contralateral) hip. 5. Lower lumbar degenerative facet arthropathy.   Electronically Signed: By: Ryan Salvage M.D. On: 01/15/2024 16:54     PATIENT SURVEYS:  ABC scale: The Activities-Specific Balance Confidence (ABC) Scale 0% 10 20 30  40 50 60 70 80 90 100% No confidence<->completely confident  "How confident are you that you will not lose your balance or become unsteady when you . . .   Date tested 06/10/2024  Walk around the house 80%  2. Walk up or down stairs 20%  3. Bend over and pick up a slipper from in front of a closet floor 30%  4. Reach for a small can off a shelf at eye level 80%  5. Stand on tip toes and reach for something above your head 20%  6. Stand on a chair and reach for something 0%  7. Sweep the floor 60%  8. Walk outside the house to a car parked in the driveway 50%  9. Get into or out of a car 50%  10. Walk across a parking lot to the mall 10%  11. Walk up or down a ramp 50%  12. Walk in a crowded mall where people rapidly walk past you 10%  13. Are bumped into by people as you walk through the mall 10%  14. Step onto or off of an escalator while you are holding onto the railing 20%  15. Step onto or off an escalator while holding onto parcels such that you cannot hold onto the railing 10%  16. Walk outside on icy sidewalks 0%  Total: #/16 26.25%     COGNITION: Overall cognitive status: Within functional limits for tasks assessed     SENSATION: WFL   POSTURE: rounded shoulders and forward head  PALPATION: (+) tenderness (lateral hip and posteior right off edge of R sided sacrum)   LOWER EXTREMITY ROM:  Active ROM Right eval Left eval  Hip flexion    Hip extension    Hip abduction    Hip adduction    Hip  internal rotation    Hip external rotation    Knee flexion    Knee extension    Ankle dorsiflexion    Ankle plantarflexion    Ankle inversion    Ankle eversion     (Blank rows = not tested)  LOWER EXTREMITY MMT:  MMT Right eval Left eval  Hip flexion 4 4+  Hip extension    Hip abduction 4 4+  Hip adduction    Hip internal rotation 4 4+  Hip external rotation 4 4+  Knee flexion 4 4  Knee extension 4 4  Ankle dorsiflexion 4+ 4+  Ankle plantarflexion    Ankle inversion    Ankle eversion     (Blank rows = not tested)  LOWER EXTREMITY SPECIAL TESTS:  None  FUNCTIONAL TESTS:  30 seconds chair stand test= Unable to stand without UE support today but did perform 10 reps with min UE support Timed up and go (TUG): 15.10 and 15.33=15.2 sec with RW 6 minute walk test: to be assessed next visit. 10 meter walk test: 12.10 and 11.69 sec =0.85 m/s avg with RW  Berg Balance Scale:  Item Test date: 06/10/2024 Date:  Date:   Sitting to standing 3. able to stand independently using hands Insert SmartPhrase OPRCBERGREEVAL Insert SmartPhrase OPRCBERGREEVAL  2. Standing unsupported 4. able to stand safely for 2 minutes    3. Sitting with back unsupported, feet supported 4. able to sit safely and securely for 2 minutes    4. Standing to sitting 3. controls descent by using hands    5. Pivot transfer  3. able to transfer safely with definite need of hands    6. Standing unsupported with eyes closed 4. able to stand 10 seconds safely    7. Standing unsupported with feet together 4. able to place feet together independently and stand 1 minute safely    8. Reaching forward with outstretched arms while standing 4. can reach forward confidently 25 cm (10 inches)    9. Pick up object from the floor from standing 3. able to pick up slipper but needs supervision    10. Turning to look behind over  left and right shoulders while standing 2. turns sideways but only maintains balance    11. Turn 360  degrees 2. able to turn 360 degrees safely but slowly    12. Place alternate foot on step or stool while standing unsupported 3. able to stand independently and complete 8 steps in > 20 seconds     13. Standing unsupported one foot in front 2. able to take small step independently and hold 30 seconds    14. Standing on one leg 1. tries to lift leg unable to hold 3 seconds but remains standing independently.      Total Score 42/56 Total Score:    Total Score:      GAIT: Distance walked: 150 feet Assistive device utilized: Walker - 2 wheeled Level of assistance: CGA Comments: Mild antalgia noted on right and decreased step length - short reciprocal steps with use of RW                                                                                                                                TREATMENT DATE: 06/10/2024   PT EVAL  Self care- Discussed using walker for all mobility and reviewed previous HEP and instructed to walk several times per day and add tandem and SLS (multiple attempts each LE) until next visit.    PATIENT EDUCATION:  Education details: Plan of care; Purpose of PT and functional outcome testing Person educated: Patient and Spouse Education method: Explanation Education comprehension: verbalized understanding  HOME EXERCISE PROGRAM: Reviewed from previous episode- Instructed her to walk with walker and practice tandem and SLS until next visit.   ASSESSMENT:  CLINICAL IMPRESSION: Patient is a 83 y.o. female  who was seen today for physical therapy evaluation and treatment for history of right hip fracture. Patient is well known to clinic as she was being seen earlier this year for same issue but hip was non-healing and patient was re-hospitalized and subsequently went to rehab following admit for hyponatremia. She presents today with overall improved hip pain current rating at a 2/10 up to 7/10 and presents with RLE muscle weakness, difficulty walking - currently  using a walker, and impaired balance as seen by observed walking and with BERG balance test score indicating increased risk of falling. Patient will benefit from skilled PT services to address her LE muscle weakness and restore independent mobility for improved functional capabilities in the home and decrease her risk of falling.   OBJECTIVE IMPAIRMENTS: Abnormal gait, cardiopulmonary status limiting activity, decreased activity tolerance, decreased balance, decreased coordination, decreased endurance, decreased mobility, difficulty walking, decreased strength, and pain.   ACTIVITY LIMITATIONS: carrying, lifting, bending, sitting, standing, squatting, stairs, and transfers  PARTICIPATION LIMITATIONS: meal prep, cleaning, laundry, driving, shopping, community activity, and yard work  PERSONAL FACTORS: 1-2 comorbidities: HTN, Arthritis are also affecting patient's functional outcome.   REHAB POTENTIAL: Good  CLINICAL DECISION MAKING: Stable/uncomplicated  EVALUATION COMPLEXITY: Low   GOALS:  Goals reviewed with patient? Yes  SHORT TERM GOALS: Target date: 07/22/2024 Pt will be independent with HEP in order to improve strength and balance in order to decrease fall risk and improve function at home. Baseline: EVAL- Patient reports has not been performing any former HEP since her hospitalization in June Goal status: INITIAL LONG TERM GOALS: Target date: 09/02/2024  Patient will report < 4/10 Right hip pain at worst with all functional activities.  Baseline: EVAL= worst = 7/10 Goal status: INITIAL  2.  Patient will complete >12 reps without use of UE support in 30 second sit to stand test indicating an increased LE strength and improved balance. Baseline: EVAL: Unable to stand without UE support today but did perform 10 reps with min UE support Goal status: INITIAL  3.  Patient will improve ABC score by 15 points to demonstrate statistically significant improvement in mobility and quality of  life as it relates to their confidence in their balance.  Baseline: EVAL: 26.25 Goal status: INITIAL   4.  Patient will increase Berg Balance score by > 6 points to demonstrate decreased fall risk during functional activities. Baseline: EVAL= 42/56 Goal status: INITIAL   5.   Patient will reduce timed up and go to <11 seconds to reduce fall risk and demonstrate improved transfer/gait ability. Baseline: EVAL= 15.2 sec with RW Goal status: INITIAL  6.   Patient will increase 10 meter walk test to >1.52m/s as to improve gait speed for better community ambulation and to reduce fall risk. Baseline: EVAL= 0.85 m/s with RW Goal status: INITIAL  7.   Patient will increase six minute walk test distance to >1000 for progression to community ambulator and improve gait ability Baseline: Eval= To be completed at visit #2 Goal status: INITIAL    PLAN:  PT FREQUENCY: 1-2x/week  PT DURATION: 12 weeks  PLANNED INTERVENTIONS: [id number]- PT Re-evaluation, 97750- Physical Performance Testing, 97110-Therapeutic exercises, 97530- Therapeutic activity, [id number]- Neuromuscular re-education, 97535- Self Care, [id number]- Manual therapy, [id number]- Gait training, [id number]- Orthotic Initial, [id number]- Orthotic/Prosthetic subsequent, [phone]- Canalith repositioning, [id number]- Electrical stimulation (manual), [phone] (1-2 muscles), [id number] (3+ muscles)- Dry Needling, Patient/Family education, Balance training, Stair training, Taping, Joint mobilization, Joint manipulation, Spinal mobilization, Vestibular training, DME instructions, Cryotherapy, and Moist heat  PLAN FOR NEXT SESSION:  6 min walk test R hip strengthening Gait training with least restrictive device Balance training Add to HEP as appropriate.    Sarah Harper, PT 06/11/2024, 8:00 AM

## 2024-06-16 ENCOUNTER — Ambulatory Visit

## 2024-06-16 DIAGNOSIS — M25551 Pain in right hip: Secondary | ICD-10-CM

## 2024-06-16 DIAGNOSIS — M6281 Muscle weakness (generalized): Secondary | ICD-10-CM

## 2024-06-16 DIAGNOSIS — R262 Difficulty in walking, not elsewhere classified: Secondary | ICD-10-CM

## 2024-06-16 DIAGNOSIS — R269 Unspecified abnormalities of gait and mobility: Secondary | ICD-10-CM | POA: Diagnosis not present

## 2024-06-16 DIAGNOSIS — R278 Other lack of coordination: Secondary | ICD-10-CM

## 2024-06-16 DIAGNOSIS — R2689 Other abnormalities of gait and mobility: Secondary | ICD-10-CM

## 2024-06-16 NOTE — Therapy (Signed)
 OUTPATIENT PHYSICAL THERAPY LOWER EXTREMITY TREATMENT   Patient Name: [name] MRN: [id number] DOB:01-05-1941, 83 y.o., female Today's Date: 06/16/2024  END OF SESSION:  PT End of Session - 06/16/24 1006     Visit Number 2    Number of Visits 24    Date for Recertification  09/03/23    Progress Note Due on Visit 10    PT Start Time 1007    PT Stop Time 1049    PT Time Calculation (min) 42 min    Equipment Utilized During Treatment Gait belt    Activity Tolerance Patient tolerated treatment well    Behavior During Therapy WFL for tasks assessed/performed           Past Medical History:  Diagnosis Date   Anxiety 2022   Arthritis    Blood loss anemia 09/13/2023   Cataract 1997   Colon cancer screening 02/05/2018   DDD (degenerative disc disease)    chronic back pain   ETD (eustachian tube dysfunction)    GERD (gastroesophageal reflux disease)    Hypertension 2022   IBS (irritable bowel syndrome)    Meniere's disease    Migraine    still gets visual aura from time to time   Mild cognitive impairment    Osteoporosis    Sleep apnea 2017   CPAP   Stroke (HCC) 2022   Syncopal episodes    after work-up - possible seizures?   Thyroid  disease 1990   hypothyroid   TIA (transient ischemic attack) 11/09/2020   Past Surgical History:  Procedure Laterality Date   APPENDECTOMY     CATARACT EXTRACTION W/PHACO Right 05/21/2019   Procedure: CATARACT EXTRACTION PHACO AND INTRAOCULAR LENS PLACEMENT (IOC) RIGHT panoptix lens  00:35.0  21.7%  7.61;  Surgeon: Mittie Gaskin, MD;  Location: [facility];  Service: Ophthalmology;  Laterality: Right;  sleep apnea requests early   CATARACT EXTRACTION W/PHACO Left 06/11/2019   Procedure: CATARACT EXTRACTION PHACO AND INTRAOCULAR LENS PLACEMENT (IOC) LEFT PANOPTIX TORIC LENS  00:50.0  20.3%  10.21;  Surgeon: Mittie Gaskin, MD;  Location: [facility];  Service: Ophthalmology;  Laterality: Left;  sleep  apnea requests early   COLONOSCOPY     last 2012 - [facility]   ESOPHAGOGASTRODUODENOSCOPY     Multiple, last 01/07/2017 with Savary dilation to 18 mm   EYE SURGERY  August 2022   Cataracs removed   FRACTURE SURGERY  2025   HEMORRHOID BANDING  2018   [facility]   INTRAMEDULLARY (IM) NAIL INTERTROCHANTERIC Right 09/06/2023   Procedure: INTRAMEDULLARY (IM) NAIL INTERTROCHANTERIC;  Surgeon: Celena Sharper, MD;  Location: [facility];  Service: Orthopedics;  Laterality: Right;   LUMBAR DISC SURGERY  1984   L5    Patient Active Problem List   Diagnosis Date Noted   Vitamin D  deficiency 01/29/2024   Closed right hip fracture (HCC) 09/05/2023   Current use of proton pump inhibitor 03/13/2023   Pain in joint, lower leg 01/30/2023   Migraine    Essential hypertension    H/O: CVA (cerebrovascular accident) 11/08/2020   Colon cancer screening 02/05/2018   Degenerative joint disease (DJD) of lumbar spine 05/02/2017   Internal hemorrhoids 05/02/2017   Generalized osteoarthritis of hand 12/11/2016   GAD (generalized anxiety disorder) 12/11/2016   Estrogen deficiency 05/11/2016   Encounter for screening mammogram for breast cancer 05/11/2016   OSA (obstructive sleep apnea) 03/06/2016   Left knee pain 10/18/2015   Snoring 10/18/2015   Routine general medical examination at  a health care facility 02/28/2015   Hyperlipidemia 08/27/2012   Osteopenia 12/17/2009   BACK PAIN, LUMBAR 07/02/2009   IRRITABLE BOWEL SYNDROME 04/02/2009   Asymptomatic postmenopausal status 12/10/2008   Hypothyroidism 12/05/2007   Meniere's disease 12/05/2007   GERD 12/05/2007    PCP: Dr. Laine Balls  REFERRING PROVIDER: Dr. Ozell Bruch  REFERRING DIAG: R hip fracture  THERAPY DIAG:  Abnormality of gait and mobility  Difficulty in walking, not elsewhere classified  Muscle weakness (generalized)  Other abnormalities of gait and mobility  Pain in right hip  Other lack of coordination  Rationale for Evaluation  and Treatment: Rehabilitation  ONSET DATE: 09/06/2023  SUBJECTIVE:   SUBJECTIVE STATEMENT: From Today:   From Eval:  Patient reports feeling better overall and states her MD wants her back in PT to work her out and strengthen her hip and work on balance. She denies any recent falls and reports her hip is feeling better since she was in PT back in June.   PERTINENT HISTORY: Sarah Harper is a 83 year old female with past medical history significant for HTN, hypothyroidism, GERD, osteopenia, dyslipidemia, OSA who presented to [facility] ED on 1/15 fall mechanical fall at home sustaining injury to her right hip. Patient underwent intramedullary nailing of the right hip 09/06/2023 by Dr. Bruch. Patient was receiving outpatient PT in May however was admitted back to hospital with Hyponatremia and acute metabolic encephalopathy. She has continued to be followed since release from hospital by Dr. Bruch for ongoing hip pain and received a bone stimulator/ultrasound device to help with non-healing hip and -Ultrasound/bone stimulator treatments 2 sets of 6 week treatment. Has competed 80/90 treatemnt and has f/u with Dr. Bruch on 10/29.  PAIN:  Are you having pain? Yes: NPRS scale: 2/10  Pain location: Right posterior superior gluteal Pain description: aggravating achy- constant Aggravating factors: Worse with ultrasound treatment Relieving factors: lying flat  PRECAUTIONS: Other: WBAT RLE and no restrictions per MD order  RED FLAGS: None   WEIGHT BEARING RESTRICTIONS: R LE WBAT  FALLS:  Has patient fallen in last 6 months? No  LIVING ENVIRONMENT: Lives with: lives with their spouse Lives in: House/apartment Stairs: Yes: Internal: 2 steps; on right going up Has following equipment at home: Vannie - 2 wheeled and Grab bars  OCCUPATION: Stage manager  PLOF: Independent  PATIENT GOALS: Hope to be able to walk on my own without a device and be able to drive independently  NEXT MD  VISIT: 06/18/2024- Dr. Bruch  OBJECTIVE:  Note: Objective measures were completed at Evaluation unless otherwise noted.  DIAGNOSTIC FINDINGS:  ADDENDUM REPORT: 03/05/2024 08:07   ADDENDUM: The original report was by Dr. Ryan Salvage. The following addendum is by Dr. Ryan Salvage:   With respect to further specifics on the intertrochanteric fracture, the original fracture as shown on the radiographs from 09/05/2023 does include a small comminuted avulsion of the lesser trochanter, with a component measuring 2.4 by 1.0 by 0.6 cm, shown for example on image 63 series 5 and image 102 series 2. It is conceivable that the lucency described as likely hyperemic could have been a donor site for this fragment, although the fragment is about 1.9 cm posterior to this lucency, and the natural action of the iliopsoas would tend to pull the fragment anteriorly and cephalad rather than posteriorly.   With respect to infection as a cause for the lucency, I am skeptical given the lack of abnormal lucency extending along the hardware, although if the  patient is experiencing fever and signs of infection then close monitoring would be recommended.     Electronically Signed   By: Ryan Salvage M.D.   On: 03/05/2024 08:07    Addended by Salvage Sawyers, MD on 03/05/2024  8:09 AM    Study Result  Narrative & Impression  CLINICAL DATA:  Right SI joint pain. Prior right hip intertrochanteric fracture ORIF in January 2025.   EXAM: CT PELVIS WITHOUT CONTRAST   TECHNIQUE: Multidetector CT imaging of the pelvis was performed following the standard protocol without intravenous contrast.   RADIATION DOSE REDUCTION: This exam was performed according to the departmental dose-optimization program which includes automated exposure control, adjustment of the mA and/or kV according to patient size and/or use of iterative reconstruction technique.   COMPARISON:  Multiple exams, including  radiographs 09/05/2023   FINDINGS: OSSEOUS STRUCTURES AND JOINTS:   Lower lumbar degenerative facet arthropathy. No SI joint diastasis. A small amount of nitrogen gas phenomenon in both SI joints without SI joint effusion observed. No erosions or other specific abnormalities along the SI joints aside from mild spurring.   No pelvic fracture observed.   Right femoral nail system in place with single distal interlocking screw and femoral head screw noted. This traverses the intertrochanteric fracture which is still visible, with notable lucency along part of the medial fracture margin on image 52 series 5 and image 99 series 2 probably from hyperemic resorption of bone in this local vicinity. There is potentially some early bridging of bone anteriorly at the fracture site. Early cortication along the posterior portion of the fracture may suggest early nonunion of the posterior portion for example as on image 62 series 5.   No abnormal lucency around the components of the nail system to indicate loosening or infection.   MUSCULOTENDINOUS:   Unremarkable   SACRAL PLEXUS:   No impinging lesion along the sacral plexus or proximal sciatic nerves.   OTHER:   Prominent stool throughout the colon favors constipation. Mild degenerative arthropathy of the left (contralateral) hip.   IMPRESSION: 1. No specific abnormality is identified to explain the patient's right SI joint pain. 2. Right femoral nail system in place traversing the intertrochanteric fracture. Questionable partial early nonunion of the posterior portion of the fracture anterior bridging is appreciated. There is some accentuated lucency along the medial portion of the fracture probably from hyperemic resorption of bone, but no abnormal extension of lucency along components of the IM nail or femoral head nail to suggest loosening/infection. 3. Prominent stool throughout the colon favors constipation. 4. Mild  degenerative arthropathy of the left (contralateral) hip. 5. Lower lumbar degenerative facet arthropathy.   Electronically Signed: By: Ryan Salvage M.D. On: 01/15/2024 16:54     PATIENT SURVEYS:  ABC scale: The Activities-Specific Balance Confidence (ABC) Scale 0% 10 20 30  40 50 60 70 80 90 100% No confidence<->completely confident  "How confident are you that you will not lose your balance or become unsteady when you . . .   Date tested 06/10/2024  Walk around the house 80%  2. Walk up or down stairs 20%  3. Bend over and pick up a slipper from in front of a closet floor 30%  4. Reach for a small can off a shelf at eye level 80%  5. Stand on tip toes and reach for something above your head 20%  6. Stand on a chair and reach for something 0%  7. Sweep the floor 60%  8. Walk  outside the house to a car parked in the driveway 50%  9. Get into or out of a car 50%  10. Walk across a parking lot to the mall 10%  11. Walk up or down a ramp 50%  12. Walk in a crowded mall where people rapidly walk past you 10%  13. Are bumped into by people as you walk through the mall 10%  14. Step onto or off of an escalator while you are holding onto the railing 20%  15. Step onto or off an escalator while holding onto parcels such that you cannot hold onto the railing 10%  16. Walk outside on icy sidewalks 0%  Total: #/16 26.25%     COGNITION: Overall cognitive status: Within functional limits for tasks assessed     SENSATION: WFL   POSTURE: rounded shoulders and forward head  PALPATION: (+) tenderness (lateral hip and posteior right off edge of R sided sacrum)   LOWER EXTREMITY ROM:  Active ROM Right eval Left eval  Hip flexion    Hip extension    Hip abduction    Hip adduction    Hip internal rotation    Hip external rotation    Knee flexion    Knee extension    Ankle dorsiflexion    Ankle plantarflexion    Ankle inversion    Ankle eversion     (Blank rows = not  tested)  LOWER EXTREMITY MMT:  MMT Right eval Left eval  Hip flexion 4 4+  Hip extension    Hip abduction 4 4+  Hip adduction    Hip internal rotation 4 4+  Hip external rotation 4 4+  Knee flexion 4 4  Knee extension 4 4  Ankle dorsiflexion 4+ 4+  Ankle plantarflexion    Ankle inversion    Ankle eversion     (Blank rows = not tested)  LOWER EXTREMITY SPECIAL TESTS:  None  FUNCTIONAL TESTS:  30 seconds chair stand test= Unable to stand without UE support today but did perform 10 reps with min UE support Timed up and go (TUG): 15.10 and 15.33=15.2 sec with RW 6 minute walk test: to be assessed next visit. 10 meter walk test: 12.10 and 11.69 sec =0.85 m/s avg with RW  Berg Balance Scale:  Item Test date: 06/10/2024 Date:  Date:   Sitting to standing 3. able to stand independently using hands Insert SmartPhrase OPRCBERGREEVAL Insert SmartPhrase OPRCBERGREEVAL  2. Standing unsupported 4. able to stand safely for 2 minutes    3. Sitting with back unsupported, feet supported 4. able to sit safely and securely for 2 minutes    4. Standing to sitting 3. controls descent by using hands    5. Pivot transfer  3. able to transfer safely with definite need of hands    6. Standing unsupported with eyes closed 4. able to stand 10 seconds safely    7. Standing unsupported with feet together 4. able to place feet together independently and stand 1 minute safely    8. Reaching forward with outstretched arms while standing 4. can reach forward confidently 25 cm (10 inches)    9. Pick up object from the floor from standing 3. able to pick up slipper but needs supervision    10. Turning to look behind over left and right shoulders while standing 2. turns sideways but only maintains balance    11. Turn 360 degrees 2. able to turn 360 degrees safely but slowly    12.  Place alternate foot on step or stool while standing unsupported 3. able to stand independently and complete 8 steps in > 20 seconds      13. Standing unsupported one foot in front 2. able to take small step independently and hold 30 seconds    14. Standing on one leg 1. tries to lift leg unable to hold 3 seconds but remains standing independently.      Total Score 42/56 Total Score:    Total Score:      GAIT: Distance walked: 150 feet Assistive device utilized: Walker - 2 wheeled Level of assistance: CGA Comments: Mild antalgia noted on right and decreased step length - short reciprocal steps with use of RW                                                                                                                                TREATMENT DATE: 06/10/2024   6 Min Walk Test:  Instructed patient to ambulate as quickly and as safely as possible for 6 minutes using LRAD. Patient was allowed to take standing rest breaks without stopping the test, but if the patient required a sitting rest break the clock would be stopped and the test would be over.  Results: 805 feet with RW (245 meters, Avg speed 0.68 m/s) using a RW with CGA. Results indicate that the patient has reduced endurance with ambulation compared to age matched norms.  Age Matched Norms: 58-69 yo M: 71 F: 60, 77-79 yo M: 85 F: 471, 52-89 yo M: 417 F: 392 MDC: 58.21 meters (190.98 feet) or 50 meters (ANPTA Core Set of Outcome Measures for Adults with Neurologic Conditions, 2018)   NMR:  SLS - attempted many trials each LE (only able to hold up to 3 sec on RLE and 7-9 sec on Left  Tandem standing- multiple attempts - up to 11 sec with goal initiated at 30 sec for HEP- Transitioned to tandem walking x several trials x a few feet in // bars  TE: Resistive hip abd with RTB - walking in // bars x 3 down and back Sit to stand for LE strength x 10 (required use of BUE Support for 5 and 1 UE for 5 reps)   Self care/Home management:  Added HEP as instructed below and issued handout   PATIENT EDUCATION:  Education details: rationale for 6 min walk and  instruction in HEP Person educated: Patient and Spouse Education method: Explanation Education comprehension: verbalized understanding  HOME EXERCISE PROGRAM: Access Code: [email or web address] URL: [email or web address] Date: 06/16/2024 Prepared by: Reyes London  Exercises - Single Leg Stance  - 3 x weekly - 5-10 sets - up to 20 sec hold - Standing Hip Flexion March  - 3 x weekly - 3 sets - 10 reps - 2 sec hold - Tandem Stance  - 3 x weekly - 5 sets - 30 sec  hold - Side Stepping with Resistance at Ankles  -  3 x weekly - 3 sets - 10 reps - Sit to Stand with Arms Crossed  - 3 x weekly - 3 sets - 10 reps  ASSESSMENT:  CLINICAL IMPRESSION: Patient is a 83 y.o. female  who was seen today for physical therapy treatment for history of right hip fracture. Further assessment reveals more functional deficits including below normal values for gait distance with 6 min walk test when compared to age/sex based norms. She was instructed in beginner balance and some LE strengthening exercises with remaining time today and responded well to instruction - verbal and visual cues to complete correctly. Issued HEP for home use and provided handout and red theraband. Patient will benefit from skilled PT services to address her LE muscle weakness and restore independent mobility for improved functional capabilities in the home and decrease her risk of falling.   OBJECTIVE IMPAIRMENTS: Abnormal gait, cardiopulmonary status limiting activity, decreased activity tolerance, decreased balance, decreased coordination, decreased endurance, decreased mobility, difficulty walking, decreased strength, and pain.   ACTIVITY LIMITATIONS: carrying, lifting, bending, sitting, standing, squatting, stairs, and transfers  PARTICIPATION LIMITATIONS: meal prep, cleaning, laundry, driving, shopping, community activity, and yard work  PERSONAL FACTORS: 1-2 comorbidities: HTN, Arthritis are also affecting patient's  functional outcome.   REHAB POTENTIAL: Good  CLINICAL DECISION MAKING: Stable/uncomplicated  EVALUATION COMPLEXITY: Low   GOALS: Goals reviewed with patient? Yes  SHORT TERM GOALS: Target date: 07/22/2024 Pt will be independent with HEP in order to improve strength and balance in order to decrease fall risk and improve function at home. Baseline: EVAL- Patient reports has not been performing any former HEP since her hospitalization in June Goal status: INITIAL LONG TERM GOALS: Target date: 09/02/2024  Patient will report < 4/10 Right hip pain at worst with all functional activities.  Baseline: EVAL= worst = 7/10 Goal status: INITIAL  2.  Patient will complete >12 reps without use of UE support in 30 second sit to stand test indicating an increased LE strength and improved balance. Baseline: EVAL: Unable to stand without UE support today but did perform 10 reps with min UE support Goal status: INITIAL  3.  Patient will improve ABC score by 15 points to demonstrate statistically significant improvement in mobility and quality of life as it relates to their confidence in their balance.  Baseline: EVAL: 26.25 Goal status: INITIAL   4.  Patient will increase Berg Balance score by > 6 points to demonstrate decreased fall risk during functional activities. Baseline: EVAL= 42/56 Goal status: INITIAL   5.   Patient will reduce timed up and go to <11 seconds to reduce fall risk and demonstrate improved transfer/gait ability. Baseline: EVAL= 15.2 sec with RW Goal status: INITIAL  6.   Patient will increase 10 meter walk test to >1.31m/s as to improve gait speed for better community ambulation and to reduce fall risk. Baseline: EVAL= 0.85 m/s with RW Goal status: INITIAL  7.   Patient will increase six minute walk test distance to >1000 for progression to community ambulator and improve gait ability Baseline: Eval= To be completed at visit #2; 06/16/2024= 805 feet with RW Goal status:  INITIAL    PLAN:  PT FREQUENCY: 1-2x/week  PT DURATION: 12 weeks  PLANNED INTERVENTIONS: [id number]- PT Re-evaluation, 97750- Physical Performance Testing, 97110-Therapeutic exercises, 97530- Therapeutic activity, [id number]- Neuromuscular re-education, 97535- Self Care, [id number]- Manual therapy, [id number]- Gait training, [id number]- Orthotic Initial, [id number]- Orthotic/Prosthetic subsequent, [id number]- Canalith repositioning, [id number]- Electrical stimulation (manual), [id number] (1-2 muscles), 20561 (3+ muscles)- Dry  Needling, Patient/Family education, Balance training, Stair training, Taping, Joint mobilization, Joint manipulation, Spinal mobilization, Vestibular training, DME instructions, Cryotherapy, and Moist heat  PLAN FOR NEXT SESSION:   R hip strengthening Gait training with least restrictive device Balance training Add to HEP as appropriate.    Reyes LOISE London, PT 06/16/2024, 11:02 AM

## 2024-06-23 ENCOUNTER — Ambulatory Visit: Attending: Orthopedic Surgery

## 2024-06-23 ENCOUNTER — Encounter: Payer: Self-pay | Admitting: Radiology

## 2024-06-23 DIAGNOSIS — R2689 Other abnormalities of gait and mobility: Secondary | ICD-10-CM | POA: Insufficient documentation

## 2024-06-23 DIAGNOSIS — M6281 Muscle weakness (generalized): Secondary | ICD-10-CM | POA: Diagnosis present

## 2024-06-23 DIAGNOSIS — R262 Difficulty in walking, not elsewhere classified: Secondary | ICD-10-CM | POA: Diagnosis present

## 2024-06-23 DIAGNOSIS — R278 Other lack of coordination: Secondary | ICD-10-CM | POA: Insufficient documentation

## 2024-06-23 DIAGNOSIS — R269 Unspecified abnormalities of gait and mobility: Secondary | ICD-10-CM | POA: Diagnosis present

## 2024-06-23 DIAGNOSIS — M25551 Pain in right hip: Secondary | ICD-10-CM | POA: Diagnosis present

## 2024-06-23 NOTE — Therapy (Signed)
 OUTPATIENT PHYSICAL THERAPY LOWER EXTREMITY TREATMENT   Patient Name: Sarah Harper MRN: [id number] DOB:22-Mar-1941, 83 y.o., female Today's Date: 06/23/2024  END OF SESSION:  PT End of Session - 06/23/24 1020     Visit Number 3    Number of Visits 24    Date for Recertification  09/03/23    Progress Note Due on Visit 10    PT Start Time 1017    PT Stop Time 1058    PT Time Calculation (min) 41 min    Equipment Utilized During Treatment Gait belt    Activity Tolerance Patient tolerated treatment well    Behavior During Therapy WFL for tasks assessed/performed           Past Medical History:  Diagnosis Date   Anxiety 2022   Arthritis    Blood loss anemia 09/13/2023   Cataract 1997   Colon cancer screening 02/05/2018   DDD (degenerative disc disease)    chronic back pain   ETD (eustachian tube dysfunction)    GERD (gastroesophageal reflux disease)    Hypertension 2022   IBS (irritable bowel syndrome)    Meniere's disease    Migraine    still gets visual aura from time to time   Mild cognitive impairment    Osteoporosis    Sleep apnea 2017   CPAP   Stroke (HCC) 2022   Syncopal episodes    after work-up - possible seizures?   Thyroid  disease 1990   hypothyroid   TIA (transient ischemic attack) 11/09/2020   Past Surgical History:  Procedure Laterality Date   APPENDECTOMY     CATARACT EXTRACTION W/PHACO Right 05/21/2019   Procedure: CATARACT EXTRACTION PHACO AND INTRAOCULAR LENS PLACEMENT (IOC) RIGHT panoptix lens  00:35.0  21.7%  7.61;  Surgeon: Sarah Gaskin, MD;  Location: [facility];  Service: Ophthalmology;  Laterality: Right;  sleep apnea requests early   CATARACT EXTRACTION W/PHACO Left 06/11/2019   Procedure: CATARACT EXTRACTION PHACO AND INTRAOCULAR LENS PLACEMENT (IOC) LEFT PANOPTIX TORIC LENS  00:50.0  20.3%  10.21;  Surgeon: Sarah Gaskin, MD;  Location: [facility];  Service: Ophthalmology;  Laterality: Left;  sleep  apnea requests early   COLONOSCOPY     last 2012 - [facility]   ESOPHAGOGASTRODUODENOSCOPY     Multiple, last 01/07/2017 with Savary dilation to 18 mm   EYE SURGERY  August 2022   Cataracs removed   FRACTURE SURGERY  2025   HEMORRHOID BANDING  2018   [facility]   INTRAMEDULLARY (IM) NAIL INTERTROCHANTERIC Right 09/06/2023   Procedure: INTRAMEDULLARY (IM) NAIL INTERTROCHANTERIC;  Surgeon: Sarah Sharper, MD;  Location: [facility];  Service: Orthopedics;  Laterality: Right;   LUMBAR DISC SURGERY  1984   L5    Patient Active Problem List   Diagnosis Date Noted   Vitamin D  deficiency 01/29/2024   Closed right hip fracture (HCC) 09/05/2023   Current use of proton pump inhibitor 03/13/2023   Pain in joint, lower leg 01/30/2023   Migraine    Essential hypertension    H/O: CVA (cerebrovascular accident) 11/08/2020   Colon cancer screening 02/05/2018   Degenerative joint disease (DJD) of lumbar spine 05/02/2017   Internal hemorrhoids 05/02/2017   Generalized osteoarthritis of hand 12/11/2016   GAD (generalized anxiety disorder) 12/11/2016   Estrogen deficiency 05/11/2016   Encounter for screening mammogram for breast cancer 05/11/2016   OSA (obstructive sleep apnea) 03/06/2016   Left knee pain 10/18/2015   Snoring 10/18/2015   Routine general medical examination at  a health care facility 02/28/2015   Hyperlipidemia 08/27/2012   Osteopenia 12/17/2009   BACK PAIN, LUMBAR 07/02/2009   IRRITABLE BOWEL SYNDROME 04/02/2009   Asymptomatic postmenopausal status 12/10/2008   Hypothyroidism 12/05/2007   Meniere's disease 12/05/2007   GERD 12/05/2007    PCP: Dr. Laine Harper  REFERRING PROVIDER: Dr. Ozell Harper  REFERRING DIAG: R hip fracture  THERAPY DIAG:  Abnormality of gait and mobility  Difficulty in walking, not elsewhere classified  Muscle weakness (generalized)  Other abnormalities of gait and mobility  Pain in right hip  Other lack of coordination  Rationale for Evaluation  and Treatment: Rehabilitation  ONSET DATE: 09/06/2023  SUBJECTIVE:   SUBJECTIVE STATEMENT: From Today: Patient reports she surprised herself and able to complete her exericses at home well without significant issues or pain.    From Eval:  Patient reports feeling better overall and states her MD wants her back in PT to work her out and strengthen her hip and work on balance. She denies any recent falls and reports her hip is feeling better since she was in PT back in June.   PERTINENT HISTORY: Sarah Harper is a 83 year old female with past medical history significant for HTN, hypothyroidism, GERD, osteopenia, dyslipidemia, OSA who presented to [facility] ED on 1/15 fall mechanical fall at home sustaining injury to her right hip. Patient underwent intramedullary nailing of the right hip 09/06/2023 by Dr. Bruch. Patient was receiving outpatient PT in May however was admitted back to hospital with Hyponatremia and acute metabolic encephalopathy. She has continued to be followed since release from hospital by Dr. Bruch for ongoing hip pain and received a bone stimulator/ultrasound device to help with non-healing hip and -Ultrasound/bone stimulator treatments 2 sets of 6 week treatment. Has competed 80/90 treatemnt and has f/u with Dr. Bruch on 10/29.  PAIN:  Are you having pain? Yes: NPRS scale: 2/10  Pain location: Right posterior superior gluteal Pain description: aggravating achy- constant Aggravating factors: Worse with ultrasound treatment Relieving factors: lying flat  PRECAUTIONS: Other: WBAT RLE and no restrictions per MD order  RED FLAGS: None   WEIGHT BEARING RESTRICTIONS: R LE WBAT  FALLS:  Has patient fallen in last 6 months? No  LIVING ENVIRONMENT: Lives with: lives with their spouse Lives in: House/apartment Stairs: Yes: Internal: 2 steps; on right going up Has following equipment at home: Sarah Harper - 2 wheeled and Grab bars  OCCUPATION: Stage manager  PLOF:  Independent  PATIENT GOALS: Hope to be able to walk on my own without a device and be able to drive independently  NEXT MD VISIT: 06/18/2024- Dr. Bruch  OBJECTIVE:  Note: Objective measures were completed at Evaluation unless otherwise noted.  DIAGNOSTIC FINDINGS:  ADDENDUM REPORT: 03/05/2024 08:07   ADDENDUM: The original report was by Dr. Ryan Salvage. The following addendum is by Dr. Ryan Salvage:   With respect to further specifics on the intertrochanteric fracture, the original fracture as shown on the radiographs from 09/05/2023 does include a small comminuted avulsion of the lesser trochanter, with a component measuring 2.4 by 1.0 by 0.6 cm, shown for example on image 63 series 5 and image 102 series 2. It is conceivable that the lucency described as likely hyperemic could have been a donor site for this fragment, although the fragment is about 1.9 cm posterior to this lucency, and the natural action of the iliopsoas would tend to pull the fragment anteriorly and cephalad rather than posteriorly.   With respect to infection as a  cause for the lucency, I am skeptical given the lack of abnormal lucency extending along the hardware, although if the patient is experiencing fever and signs of infection then close monitoring would be recommended.     Electronically Signed   By: Ryan Salvage M.D.   On: 03/05/2024 08:07    Addended by Salvage Sawyers, MD on 03/05/2024  8:09 AM    Study Result  Narrative & Impression  CLINICAL DATA:  Right SI joint pain. Prior right hip intertrochanteric fracture ORIF in January 2025.   EXAM: CT PELVIS WITHOUT CONTRAST   TECHNIQUE: Multidetector CT imaging of the pelvis was performed following the standard protocol without intravenous contrast.   RADIATION DOSE REDUCTION: This exam was performed according to the departmental dose-optimization program which includes automated exposure control, adjustment of the mA  and/or kV according to patient size and/or use of iterative reconstruction technique.   COMPARISON:  Multiple exams, including radiographs 09/05/2023   FINDINGS: OSSEOUS STRUCTURES AND JOINTS:   Lower lumbar degenerative facet arthropathy. No SI joint diastasis. A small amount of nitrogen gas phenomenon in both SI joints without SI joint effusion observed. No erosions or other specific abnormalities along the SI joints aside from mild spurring.   No pelvic fracture observed.   Right femoral nail system in place with single distal interlocking screw and femoral head screw noted. This traverses the intertrochanteric fracture which is still visible, with notable lucency along part of the medial fracture margin on image 52 series 5 and image 99 series 2 probably from hyperemic resorption of bone in this local vicinity. There is potentially some early bridging of bone anteriorly at the fracture site. Early cortication along the posterior portion of the fracture may suggest early nonunion of the posterior portion for example as on image 62 series 5.   No abnormal lucency around the components of the nail system to indicate loosening or infection.   MUSCULOTENDINOUS:   Unremarkable   SACRAL PLEXUS:   No impinging lesion along the sacral plexus or proximal sciatic nerves.   OTHER:   Prominent stool throughout the colon favors constipation. Mild degenerative arthropathy of the left (contralateral) hip.   IMPRESSION: 1. No specific abnormality is identified to explain the patient's right SI joint pain. 2. Right femoral nail system in place traversing the intertrochanteric fracture. Questionable partial early nonunion of the posterior portion of the fracture anterior bridging is appreciated. There is some accentuated lucency along the medial portion of the fracture probably from hyperemic resorption of bone, but no abnormal extension of lucency along components of the IM  nail or femoral head nail to suggest loosening/infection. 3. Prominent stool throughout the colon favors constipation. 4. Mild degenerative arthropathy of the left (contralateral) hip. 5. Lower lumbar degenerative facet arthropathy.   Electronically Signed: By: Ryan Salvage M.D. On: 01/15/2024 16:54     PATIENT SURVEYS:  ABC scale: The Activities-Specific Balance Confidence (ABC) Scale 0% 10 20 30  40 50 60 70 80 90 100% No confidence<->completely confident  "How confident are you that you will not lose your balance or become unsteady when you . . .   Date tested 06/10/2024  Walk around the house 80%  2. Walk up or down stairs 20%  3. Bend over and pick up a slipper from in front of a closet floor 30%  4. Reach for a small can off a shelf at eye level 80%  5. Stand on tip toes and reach for something above your head 20%  6. Stand on a chair and reach for something 0%  7. Sweep the floor 60%  8. Walk outside the house to a car parked in the driveway 50%  9. Get into or out of a car 50%  10. Walk across a parking lot to the mall 10%  11. Walk up or down a ramp 50%  12. Walk in a crowded mall where people rapidly walk past you 10%  13. Are bumped into by people as you walk through the mall 10%  14. Step onto or off of an escalator while you are holding onto the railing 20%  15. Step onto or off an escalator while holding onto parcels such that you cannot hold onto the railing 10%  16. Walk outside on icy sidewalks 0%  Total: #/16 26.25%     COGNITION: Overall cognitive status: Within functional limits for tasks assessed     SENSATION: WFL   POSTURE: rounded shoulders and forward head  PALPATION: (+) tenderness (lateral hip and posteior right off edge of R sided sacrum)   LOWER EXTREMITY ROM:  Active ROM Right eval Left eval  Hip flexion    Hip extension    Hip abduction    Hip adduction    Hip internal rotation    Hip external rotation    Knee flexion     Knee extension    Ankle dorsiflexion    Ankle plantarflexion    Ankle inversion    Ankle eversion     (Blank rows = not tested)  LOWER EXTREMITY MMT:  MMT Right eval Left eval  Hip flexion 4 4+  Hip extension    Hip abduction 4 4+  Hip adduction    Hip internal rotation 4 4+  Hip external rotation 4 4+  Knee flexion 4 4  Knee extension 4 4  Ankle dorsiflexion 4+ 4+  Ankle plantarflexion    Ankle inversion    Ankle eversion     (Blank rows = not tested)  LOWER EXTREMITY SPECIAL TESTS:  None  FUNCTIONAL TESTS:  30 seconds chair stand test= Unable to stand without UE support today but did perform 10 reps with min UE support Timed up and go (TUG): 15.10 and 15.33=15.2 sec with RW 6 minute walk test: to be assessed next visit. 10 meter walk test: 12.10 and 11.69 sec =0.85 m/s avg with RW  Berg Balance Scale:  Item Test date: 06/10/2024 Date:  Date:   Sitting to standing 3. able to stand independently using hands Insert SmartPhrase OPRCBERGREEVAL Insert SmartPhrase OPRCBERGREEVAL  2. Standing unsupported 4. able to stand safely for 2 minutes    3. Sitting with back unsupported, feet supported 4. able to sit safely and securely for 2 minutes    4. Standing to sitting 3. controls descent by using hands    5. Pivot transfer  3. able to transfer safely with definite need of hands    6. Standing unsupported with eyes closed 4. able to stand 10 seconds safely    7. Standing unsupported with feet together 4. able to place feet together independently and stand 1 minute safely    8. Reaching forward with outstretched arms while standing 4. can reach forward confidently 25 cm (10 inches)    9. Pick up object from the floor from standing 3. able to pick up slipper but needs supervision    10. Turning to look behind over left and right shoulders while standing 2. turns sideways but only maintains balance  11. Turn 360 degrees 2. able to turn 360 degrees safely but slowly    12. Place  alternate foot on step or stool while standing unsupported 3. able to stand independently and complete 8 steps in > 20 seconds     13. Standing unsupported one foot in front 2. able to take small step independently and hold 30 seconds    14. Standing on one leg 1. tries to lift leg unable to hold 3 seconds but remains standing independently.      Total Score 42/56 Total Score:    Total Score:      GAIT: Distance walked: 150 feet Assistive device utilized: Walker - 2 wheeled Level of assistance: CGA Comments: Mild antalgia noted on right and decreased step length - short reciprocal steps with use of RW                                                                                                                                TREATMENT DATE: 06/10/2024   NMR:  Dynamic high knee march down and retro steps back in // bars x 8 with min UE support - VC for step height and length.     TA:  Step ups with alt LE for coordination and improved ability to neg steps- alt LE 3# x 10 reps each  Side step up 3# x 10 reps each Side Standing hip swings- CW/CCW around blue vertical foam roll 2 x 5 reps each direction ea LE Resistive walking 3# AW each LE x 330 feet with RW- VC for step length.   TE: Standing hip ext+ER (using external marker for reference to kick back to) 2 x 10 with 3#  AW- each LE Standing hip ext 3# 2 x 10  Standing knee flex 3# 2x 10   Self care/Home management:  Briefly reviewed  HEP as instructed below and issued handout   PATIENT EDUCATION:  Education details: rationale for 6 min walk and instruction in HEP Person educated: Patient and Spouse Education method: Explanation Education comprehension: verbalized understanding  HOME EXERCISE PROGRAM: Access Code: [email or web address] URL: [email or web address] Date: 06/16/2024 Prepared by: Reyes London  Exercises - Single Leg Stance  - 3 x weekly - 5-10 sets - up to 20 sec hold - Standing Hip Flexion March   - 3 x weekly - 3 sets - 10 reps - 2 sec hold - Tandem Stance  - 3 x weekly - 5 sets - 30 sec  hold - Side Stepping with Resistance at Ankles  - 3 x weekly - 3 sets - 10 reps - Sit to Stand with Arms Crossed  - 3 x weekly - 3 sets - 10 reps  ASSESSMENT:  CLINICAL IMPRESSION: Patient is a 83 y.o. female  who was seen today for physical therapy treatment for history of right hip fracture. Further assessment reveals more functional deficits including below normal values for gait distance with 6 min walk test  when compared to age/sex based norms. She was instructed in beginner balance and some LE strengthening exercises with remaining time today and responded well to instruction - verbal and visual cues to complete correctly. Issued HEP for home use and provided handout and red theraband. Patient will benefit from skilled PT services to address her LE muscle weakness and restore independent mobility for improved functional capabilities in the home and decrease her risk of falling.   OBJECTIVE IMPAIRMENTS: Abnormal gait, cardiopulmonary status limiting activity, decreased activity tolerance, decreased balance, decreased coordination, decreased endurance, decreased mobility, difficulty walking, decreased strength, and pain.   ACTIVITY LIMITATIONS: carrying, lifting, bending, sitting, standing, squatting, stairs, and transfers  PARTICIPATION LIMITATIONS: meal prep, cleaning, laundry, driving, shopping, community activity, and yard work  PERSONAL FACTORS: 1-2 comorbidities: HTN, Arthritis are also affecting patient's functional outcome.   REHAB POTENTIAL: Good  CLINICAL DECISION MAKING: Stable/uncomplicated  EVALUATION COMPLEXITY: Low   GOALS: Goals reviewed with patient? Yes  SHORT TERM GOALS: Target date: 07/22/2024 Pt will be independent with HEP in order to improve strength and balance in order to decrease fall risk and improve function at home. Baseline: EVAL- Patient reports has not been  performing any former HEP since her hospitalization in June Goal status: INITIAL LONG TERM GOALS: Target date: 09/02/2024  Patient will report < 4/10 Right hip pain at worst with all functional activities.  Baseline: EVAL= worst = 7/10 Goal status: INITIAL  2.  Patient will complete >12 reps without use of UE support in 30 second sit to stand test indicating an increased LE strength and improved balance. Baseline: EVAL: Unable to stand without UE support today but did perform 10 reps with min UE support Goal status: INITIAL  3.  Patient will improve ABC score by 15 points to demonstrate statistically significant improvement in mobility and quality of life as it relates to their confidence in their balance.  Baseline: EVAL: 26.25 Goal status: INITIAL   4.  Patient will increase Berg Balance score by > 6 points to demonstrate decreased fall risk during functional activities. Baseline: EVAL= 42/56 Goal status: INITIAL   5.   Patient will reduce timed up and go to <11 seconds to reduce fall risk and demonstrate improved transfer/gait ability. Baseline: EVAL= 15.2 sec with RW Goal status: INITIAL  6.   Patient will increase 10 meter walk test to >1.20m/s as to improve gait speed for better community ambulation and to reduce fall risk. Baseline: EVAL= 0.85 m/s with RW Goal status: INITIAL  7.   Patient will increase six minute walk test distance to >1000 for progression to community ambulator and improve gait ability Baseline: Eval= To be completed at visit #2; 06/16/2024= 805 feet with RW Goal status: INITIAL    PLAN:  PT FREQUENCY: 1-2x/week  PT DURATION: 12 weeks  PLANNED INTERVENTIONS: [id number]- PT Re-evaluation, 97750- Physical Performance Testing, 97110-Therapeutic exercises, 97530- Therapeutic activity, [id number]- Neuromuscular re-education, 97535- Self Care, [id number]- Manual therapy, [id number]- Gait training, [id number]- Orthotic Initial, [id number]- Orthotic/Prosthetic subsequent, [phone]- Canalith  repositioning, [id number]- Electrical stimulation (manual), [phone] (1-2 muscles), [id number] (3+ muscles)- Dry Needling, Patient/Family education, Balance training, Stair training, Taping, Joint mobilization, Joint manipulation, Spinal mobilization, Vestibular training, DME instructions, Cryotherapy, and Moist heat  PLAN FOR NEXT SESSION:   R hip strengthening Gait training with least restrictive device Balance training Add to HEP as appropriate.    Reyes LOISE London, PT 06/23/2024, 11:48 AM

## 2024-06-25 ENCOUNTER — Ambulatory Visit

## 2024-06-25 DIAGNOSIS — R262 Difficulty in walking, not elsewhere classified: Secondary | ICD-10-CM

## 2024-06-25 DIAGNOSIS — R278 Other lack of coordination: Secondary | ICD-10-CM

## 2024-06-25 DIAGNOSIS — R269 Unspecified abnormalities of gait and mobility: Secondary | ICD-10-CM

## 2024-06-25 DIAGNOSIS — M6281 Muscle weakness (generalized): Secondary | ICD-10-CM

## 2024-06-25 DIAGNOSIS — R2689 Other abnormalities of gait and mobility: Secondary | ICD-10-CM

## 2024-06-25 DIAGNOSIS — M25551 Pain in right hip: Secondary | ICD-10-CM

## 2024-06-25 NOTE — Therapy (Signed)
 OUTPATIENT PHYSICAL THERAPY LOWER EXTREMITY TREATMENT   Patient Name: Sarah Harper MRN: [id number] DOB:Mar 02, 1941, 83 y.o., female Today's Date: 06/25/2024  END OF SESSION:  PT End of Session - 06/25/24 1026     Visit Number 4    Number of Visits 24    Date for Recertification  09/03/23    Progress Note Due on Visit 10    PT Start Time 1017    PT Stop Time 1059    PT Time Calculation (min) 42 min    Equipment Utilized During Treatment Gait belt    Activity Tolerance Patient tolerated treatment well    Behavior During Therapy WFL for tasks assessed/performed           Past Medical History:  Diagnosis Date   Anxiety 2022   Arthritis    Blood loss anemia 09/13/2023   Cataract 1997   Colon cancer screening 02/05/2018   DDD (degenerative disc disease)    chronic back pain   ETD (eustachian tube dysfunction)    GERD (gastroesophageal reflux disease)    Hypertension 2022   IBS (irritable bowel syndrome)    Meniere's disease    Migraine    still gets visual aura from time to time   Mild cognitive impairment    Osteoporosis    Sleep apnea 2017   CPAP   Stroke (HCC) 2022   Syncopal episodes    after work-up - possible seizures?   Thyroid  disease 1990   hypothyroid   TIA (transient ischemic attack) 11/09/2020   Past Surgical History:  Procedure Laterality Date   APPENDECTOMY     CATARACT EXTRACTION W/PHACO Right 05/21/2019   Procedure: CATARACT EXTRACTION PHACO AND INTRAOCULAR LENS PLACEMENT (IOC) RIGHT panoptix lens  00:35.0  21.7%  7.61;  Surgeon: Mittie Gaskin, MD;  Location: [facility];  Service: Ophthalmology;  Laterality: Right;  sleep apnea requests early   CATARACT EXTRACTION W/PHACO Left 06/11/2019   Procedure: CATARACT EXTRACTION PHACO AND INTRAOCULAR LENS PLACEMENT (IOC) LEFT PANOPTIX TORIC LENS  00:50.0  20.3%  10.21;  Surgeon: Mittie Gaskin, MD;  Location: [facility];  Service: Ophthalmology;  Laterality: Left;  sleep  apnea requests early   COLONOSCOPY     last 2012 - [facility]   ESOPHAGOGASTRODUODENOSCOPY     Multiple, last 01/07/2017 with Savary dilation to 18 mm   EYE SURGERY  August 2022   Cataracs removed   FRACTURE SURGERY  2025   HEMORRHOID BANDING  2018   [facility]   INTRAMEDULLARY (IM) NAIL INTERTROCHANTERIC Right 09/06/2023   Procedure: INTRAMEDULLARY (IM) NAIL INTERTROCHANTERIC;  Surgeon: Celena Sharper, MD;  Location: [facility];  Service: Orthopedics;  Laterality: Right;   LUMBAR DISC SURGERY  1984   L5    Patient Active Problem List   Diagnosis Date Noted   Vitamin D  deficiency 01/29/2024   Closed right hip fracture (HCC) 09/05/2023   Current use of proton pump inhibitor 03/13/2023   Pain in joint, lower leg 01/30/2023   Migraine    Essential hypertension    H/O: CVA (cerebrovascular accident) 11/08/2020   Colon cancer screening 02/05/2018   Degenerative joint disease (DJD) of lumbar spine 05/02/2017   Internal hemorrhoids 05/02/2017   Generalized osteoarthritis of hand 12/11/2016   GAD (generalized anxiety disorder) 12/11/2016   Estrogen deficiency 05/11/2016   Encounter for screening mammogram for breast cancer 05/11/2016   OSA (obstructive sleep apnea) 03/06/2016   Left knee pain 10/18/2015   Snoring 10/18/2015   Routine general medical examination at  a health care facility 02/28/2015   Hyperlipidemia 08/27/2012   Osteopenia 12/17/2009   BACK PAIN, LUMBAR 07/02/2009   IRRITABLE BOWEL SYNDROME 04/02/2009   Asymptomatic postmenopausal status 12/10/2008   Hypothyroidism 12/05/2007   Meniere's disease 12/05/2007   GERD 12/05/2007    PCP: Dr. Laine Balls  REFERRING PROVIDER: Dr. Ozell Bruch  REFERRING DIAG: R hip fracture  THERAPY DIAG:  Abnormality of gait and mobility  Difficulty in walking, not elsewhere classified  Muscle weakness (generalized)  Other abnormalities of gait and mobility  Pain in right hip  Other lack of coordination  Rationale for Evaluation  and Treatment: Rehabilitation  ONSET DATE: 09/06/2023  SUBJECTIVE:   SUBJECTIVE STATEMENT: From Today: Patient denies any pain and any soreness   From Eval:  Patient reports feeling better overall and states her MD wants her back in PT to work her out and strengthen her hip and work on balance. She denies any recent falls and reports her hip is feeling better since she was in PT back in June.   PERTINENT HISTORY: Sarah Harper is a 83 year old female with past medical history significant for HTN, hypothyroidism, GERD, osteopenia, dyslipidemia, OSA who presented to [facility] ED on 1/15 fall mechanical fall at home sustaining injury to her right hip. Patient underwent intramedullary nailing of the right hip 09/06/2023 by Dr. Bruch. Patient was receiving outpatient PT in May however was admitted back to hospital with Hyponatremia and acute metabolic encephalopathy. She has continued to be followed since release from hospital by Dr. Bruch for ongoing hip pain and received a bone stimulator/ultrasound device to help with non-healing hip and -Ultrasound/bone stimulator treatments 2 sets of 6 week treatment. Has competed 80/90 treatemnt and has f/u with Dr. Bruch on 10/29.  PAIN:  Are you having pain? Yes: NPRS scale: 2/10  Pain location: Right posterior superior gluteal Pain description: aggravating achy- constant Aggravating factors: Worse with ultrasound treatment Relieving factors: lying flat  PRECAUTIONS: Other: WBAT RLE and no restrictions per MD order  RED FLAGS: None   WEIGHT BEARING RESTRICTIONS: R LE WBAT  FALLS:  Has patient fallen in last 6 months? No  LIVING ENVIRONMENT: Lives with: lives with their spouse Lives in: House/apartment Stairs: Yes: Internal: 2 steps; on right going up Has following equipment at home: Vannie - 2 wheeled and Grab bars  OCCUPATION: Stage manager  PLOF: Independent  PATIENT GOALS: Hope to be able to walk on my own without a device and be able  to drive independently  NEXT MD VISIT: 06/18/2024- Dr. Bruch  OBJECTIVE:  Note: Objective measures were completed at Evaluation unless otherwise noted.  DIAGNOSTIC FINDINGS:  ADDENDUM REPORT: 03/05/2024 08:07   ADDENDUM: The original report was by Dr. Ryan Salvage. The following addendum is by Dr. Ryan Salvage:   With respect to further specifics on the intertrochanteric fracture, the original fracture as shown on the radiographs from 09/05/2023 does include a small comminuted avulsion of the lesser trochanter, with a component measuring 2.4 by 1.0 by 0.6 cm, shown for example on image 63 series 5 and image 102 series 2. It is conceivable that the lucency described as likely hyperemic could have been a donor site for this fragment, although the fragment is about 1.9 cm posterior to this lucency, and the natural action of the iliopsoas would tend to pull the fragment anteriorly and cephalad rather than posteriorly.   With respect to infection as a cause for the lucency, I am skeptical given the lack of abnormal lucency  extending along the hardware, although if the patient is experiencing fever and signs of infection then close monitoring would be recommended.     Electronically Signed   By: Ryan Salvage M.D.   On: 03/05/2024 08:07    Addended by Salvage Sawyers, MD on 03/05/2024  8:09 AM    Study Result  Narrative & Impression  CLINICAL DATA:  Right SI joint pain. Prior right hip intertrochanteric fracture ORIF in January 2025.   EXAM: CT PELVIS WITHOUT CONTRAST   TECHNIQUE: Multidetector CT imaging of the pelvis was performed following the standard protocol without intravenous contrast.   RADIATION DOSE REDUCTION: This exam was performed according to the departmental dose-optimization program which includes automated exposure control, adjustment of the mA and/or kV according to patient size and/or use of iterative reconstruction technique.    COMPARISON:  Multiple exams, including radiographs 09/05/2023   FINDINGS: OSSEOUS STRUCTURES AND JOINTS:   Lower lumbar degenerative facet arthropathy. No SI joint diastasis. A small amount of nitrogen gas phenomenon in both SI joints without SI joint effusion observed. No erosions or other specific abnormalities along the SI joints aside from mild spurring.   No pelvic fracture observed.   Right femoral nail system in place with single distal interlocking screw and femoral head screw noted. This traverses the intertrochanteric fracture which is still visible, with notable lucency along part of the medial fracture margin on image 52 series 5 and image 99 series 2 probably from hyperemic resorption of bone in this local vicinity. There is potentially some early bridging of bone anteriorly at the fracture site. Early cortication along the posterior portion of the fracture may suggest early nonunion of the posterior portion for example as on image 62 series 5.   No abnormal lucency around the components of the nail system to indicate loosening or infection.   MUSCULOTENDINOUS:   Unremarkable   SACRAL PLEXUS:   No impinging lesion along the sacral plexus or proximal sciatic nerves.   OTHER:   Prominent stool throughout the colon favors constipation. Mild degenerative arthropathy of the left (contralateral) hip.   IMPRESSION: 1. No specific abnormality is identified to explain the patient's right SI joint pain. 2. Right femoral nail system in place traversing the intertrochanteric fracture. Questionable partial early nonunion of the posterior portion of the fracture anterior bridging is appreciated. There is some accentuated lucency along the medial portion of the fracture probably from hyperemic resorption of bone, but no abnormal extension of lucency along components of the IM nail or femoral head nail to suggest loosening/infection. 3. Prominent stool throughout the  colon favors constipation. 4. Mild degenerative arthropathy of the left (contralateral) hip. 5. Lower lumbar degenerative facet arthropathy.   Electronically Signed: By: Ryan Salvage M.D. On: 01/15/2024 16:54     PATIENT SURVEYS:  ABC scale: The Activities-Specific Balance Confidence (ABC) Scale 0% 10 20 30  40 50 60 70 80 90 100% No confidence<->completely confident  "How confident are you that you will not lose your balance or become unsteady when you . . .   Date tested 06/10/2024  Walk around the house 80%  2. Walk up or down stairs 20%  3. Bend over and pick up a slipper from in front of a closet floor 30%  4. Reach for a small can off a shelf at eye level 80%  5. Stand on tip toes and reach for something above your head 20%  6. Stand on a chair and reach for something 0%  7.  Sweep the floor 60%  8. Walk outside the house to a car parked in the driveway 50%  9. Get into or out of a car 50%  10. Walk across a parking lot to the mall 10%  11. Walk up or down a ramp 50%  12. Walk in a crowded mall where people rapidly walk past you 10%  13. Are bumped into by people as you walk through the mall 10%  14. Step onto or off of an escalator while you are holding onto the railing 20%  15. Step onto or off an escalator while holding onto parcels such that you cannot hold onto the railing 10%  16. Walk outside on icy sidewalks 0%  Total: #/16 26.25%     COGNITION: Overall cognitive status: Within functional limits for tasks assessed     SENSATION: WFL   POSTURE: rounded shoulders and forward head  PALPATION: (+) tenderness (lateral hip and posteior right off edge of R sided sacrum)   LOWER EXTREMITY ROM:  Active ROM Right eval Left eval  Hip flexion    Hip extension    Hip abduction    Hip adduction    Hip internal rotation    Hip external rotation    Knee flexion    Knee extension    Ankle dorsiflexion    Ankle plantarflexion    Ankle inversion    Ankle  eversion     (Blank rows = not tested)  LOWER EXTREMITY MMT:  MMT Right eval Left eval  Hip flexion 4 4+  Hip extension    Hip abduction 4 4+  Hip adduction    Hip internal rotation 4 4+  Hip external rotation 4 4+  Knee flexion 4 4  Knee extension 4 4  Ankle dorsiflexion 4+ 4+  Ankle plantarflexion    Ankle inversion    Ankle eversion     (Blank rows = not tested)  LOWER EXTREMITY SPECIAL TESTS:  None  FUNCTIONAL TESTS:  30 seconds chair stand test= Unable to stand without UE support today but did perform 10 reps with min UE support Timed up and go (TUG): 15.10 and 15.33=15.2 sec with RW 6 minute walk test: to be assessed next visit. 10 meter walk test: 12.10 and 11.69 sec =0.85 m/s avg with RW  Berg Balance Scale:  Item Test date: 06/10/2024 Date:  Date:   Sitting to standing 3. able to stand independently using hands Insert SmartPhrase OPRCBERGREEVAL Insert SmartPhrase OPRCBERGREEVAL  2. Standing unsupported 4. able to stand safely for 2 minutes    3. Sitting with back unsupported, feet supported 4. able to sit safely and securely for 2 minutes    4. Standing to sitting 3. controls descent by using hands    5. Pivot transfer  3. able to transfer safely with definite need of hands    6. Standing unsupported with eyes closed 4. able to stand 10 seconds safely    7. Standing unsupported with feet together 4. able to place feet together independently and stand 1 minute safely    8. Reaching forward with outstretched arms while standing 4. can reach forward confidently 25 cm (10 inches)    9. Pick up object from the floor from standing 3. able to pick up slipper but needs supervision    10. Turning to look behind over left and right shoulders while standing 2. turns sideways but only maintains balance    11. Turn 360 degrees 2. able to turn 360 degrees  safely but slowly    12. Place alternate foot on step or stool while standing unsupported 3. able to stand independently and  complete 8 steps in > 20 seconds     13. Standing unsupported one foot in front 2. able to take small step independently and hold 30 seconds    14. Standing on one leg 1. tries to lift leg unable to hold 3 seconds but remains standing independently.      Total Score 42/56 Total Score:    Total Score:      GAIT: Distance walked: 150 feet Assistive device utilized: Walker - 2 wheeled Level of assistance: CGA Comments: Mild antalgia noted on right and decreased step length - short reciprocal steps with use of RW                                                                                                                                TREATMENT DATE: 06/25/2024   TE:  -calf raises on 1/2 foam roll (3#AW) 3 x 10 reps BLE -Ankle DF on 1/2 foam roll 3 x 10 reps BLE -Seated Hip IR (ball squeeze) +foot/lower leg- abd 2x 10 3# AW -Seated Hip ER (resistive GTB) tied from leg of chair to ankle 2 x 10     TA:  Step ups with alt LE for coordination and improved ability to neg steps- alt LE 3# x 10 reps each  Side step up/down on opp side- 3# x 10 reps each Side Resistive walking 3# AW each LE x 475 feet with RW- VC for step length yet good reciprocal steps and continuous motion of walker.  Resistive gait in 10 feet area - counting steps fwd then bwd x 4 -8 steps fwd/17 steps bwd -7 steps fwd/12 steps bwd  -7 steps fwd/11 steps bwd -7 steps fwd/11 steps bwd Sit to stand with overhead ball raise x 10 reps    PATIENT EDUCATION:  Education details: rationale for 6 min walk and instruction in HEP Person educated: Patient and Spouse Education method: Explanation Education comprehension: verbalized understanding  HOME EXERCISE PROGRAM: Access Code: [email or web address] URL: [email or web address] Date: 06/16/2024 Prepared by: Reyes London  Exercises - Single Leg Stance  - 3 x weekly - 5-10 sets - up to 20 sec hold - Standing Hip Flexion March  - 3 x weekly - 3 sets - 10 reps - 2  sec hold - Tandem Stance  - 3 x weekly - 5 sets - 30 sec  hold - Side Stepping with Resistance at Ankles  - 3 x weekly - 3 sets - 10 reps - Sit to Stand with Arms Crossed  - 3 x weekly - 3 sets - 10 reps  ASSESSMENT:  CLINICAL IMPRESSION: Patient is a 83 y.o. female  who was seen today for physical therapy treatment for history of right hip fracture. Patient presents with improving gait quality- improved reciprocal steps and step length with continuous motion  of walker. She responded better to hip strengthening today with only minor complaint of some soreness with hip IR and ER.  Patient will benefit from skilled PT services to address her LE muscle weakness and restore independent mobility for improved functional capabilities in the home and decrease her risk of falling.   OBJECTIVE IMPAIRMENTS: Abnormal gait, cardiopulmonary status limiting activity, decreased activity tolerance, decreased balance, decreased coordination, decreased endurance, decreased mobility, difficulty walking, decreased strength, and pain.   ACTIVITY LIMITATIONS: carrying, lifting, bending, sitting, standing, squatting, stairs, and transfers  PARTICIPATION LIMITATIONS: meal prep, cleaning, laundry, driving, shopping, community activity, and yard work  PERSONAL FACTORS: 1-2 comorbidities: HTN, Arthritis are also affecting patient's functional outcome.   REHAB POTENTIAL: Good  CLINICAL DECISION MAKING: Stable/uncomplicated  EVALUATION COMPLEXITY: Low   GOALS: Goals reviewed with patient? Yes  SHORT TERM GOALS: Target date: 07/22/2024 Pt will be independent with HEP in order to improve strength and balance in order to decrease fall risk and improve function at home. Baseline: EVAL- Patient reports has not been performing any former HEP since her hospitalization in June Goal status: INITIAL LONG TERM GOALS: Target date: 09/02/2024  Patient will report < 4/10 Right hip pain at worst with all functional activities.   Baseline: EVAL= worst = 7/10 Goal status: INITIAL  2.  Patient will complete >12 reps without use of UE support in 30 second sit to stand test indicating an increased LE strength and improved balance. Baseline: EVAL: Unable to stand without UE support today but did perform 10 reps with min UE support Goal status: INITIAL  3.  Patient will improve ABC score by 15 points to demonstrate statistically significant improvement in mobility and quality of life as it relates to their confidence in their balance.  Baseline: EVAL: 26.25 Goal status: INITIAL   4.  Patient will increase Berg Balance score by > 6 points to demonstrate decreased fall risk during functional activities. Baseline: EVAL= 42/56 Goal status: INITIAL   5.   Patient will reduce timed up and go to <11 seconds to reduce fall risk and demonstrate improved transfer/gait ability. Baseline: EVAL= 15.2 sec with RW Goal status: INITIAL  6.   Patient will increase 10 meter walk test to >1.77m/s as to improve gait speed for better community ambulation and to reduce fall risk. Baseline: EVAL= 0.85 m/s with RW Goal status: INITIAL  7.   Patient will increase six minute walk test distance to >1000 for progression to community ambulator and improve gait ability Baseline: Eval= To be completed at visit #2; 06/16/2024= 805 feet with RW Goal status: INITIAL    PLAN:  PT FREQUENCY: 1-2x/week  PT DURATION: 12 weeks  PLANNED INTERVENTIONS: [id number]- PT Re-evaluation, 97750- Physical Performance Testing, 97110-Therapeutic exercises, 97530- Therapeutic activity, [id number]- Neuromuscular re-education, 97535- Self Care, [id number]- Manual therapy, [id number]- Gait training, [id number]- Orthotic Initial, [id number]- Orthotic/Prosthetic subsequent, [phone]- Canalith repositioning, [id number]- Electrical stimulation (manual), [phone] (1-2 muscles), [id number] (3+ muscles)- Dry Needling, Patient/Family education, Balance training, Stair training, Taping, Joint mobilization, Joint  manipulation, Spinal mobilization, Vestibular training, DME instructions, Cryotherapy, and Moist heat  PLAN FOR NEXT SESSION:   R hip strengthening Gait training with least restrictive device Balance training Add to HEP as appropriate.    Reyes LOISE London, PT 06/25/2024, 11:06 AM

## 2024-06-27 ENCOUNTER — Other Ambulatory Visit: Payer: Self-pay | Admitting: Family Medicine

## 2024-06-27 DIAGNOSIS — I10 Essential (primary) hypertension: Secondary | ICD-10-CM

## 2024-06-30 ENCOUNTER — Ambulatory Visit

## 2024-06-30 DIAGNOSIS — M6281 Muscle weakness (generalized): Secondary | ICD-10-CM

## 2024-06-30 DIAGNOSIS — R278 Other lack of coordination: Secondary | ICD-10-CM

## 2024-06-30 DIAGNOSIS — R2689 Other abnormalities of gait and mobility: Secondary | ICD-10-CM

## 2024-06-30 DIAGNOSIS — M25551 Pain in right hip: Secondary | ICD-10-CM

## 2024-06-30 DIAGNOSIS — R262 Difficulty in walking, not elsewhere classified: Secondary | ICD-10-CM

## 2024-06-30 DIAGNOSIS — R269 Unspecified abnormalities of gait and mobility: Secondary | ICD-10-CM

## 2024-06-30 NOTE — Therapy (Signed)
 OUTPATIENT PHYSICAL THERAPY LOWER EXTREMITY TREATMENT   Patient Name: Sarah Harper MRN: [id number] DOB:[birth year], 83 y.o., female Today's Date: 06/30/2024  END OF SESSION:  PT End of Session - 06/30/24 1059     Visit Number 5    Number of Visits 24    Date for Recertification  09/03/23    Progress Note Due on Visit 10    PT Start Time 1100    PT Stop Time 1142    PT Time Calculation (min) 42 min    Equipment Utilized During Treatment Gait belt    Activity Tolerance Patient tolerated treatment well    Behavior During Therapy WFL for tasks assessed/performed           Past Medical History:  Diagnosis Date   Anxiety 2022   Arthritis    Blood loss anemia 09/13/2023   Cataract 1997   Colon cancer screening 02/05/2018   DDD (degenerative disc disease)    chronic back pain   ETD (eustachian tube dysfunction)    GERD (gastroesophageal reflux disease)    Hypertension 2022   IBS (irritable bowel syndrome)    Meniere's disease    Migraine    still gets visual aura from time to time   Mild cognitive impairment    Osteoporosis    Sleep apnea 2017   CPAP   Stroke (HCC) 2022   Syncopal episodes    after work-up - possible seizures?   Thyroid  disease 1990   hypothyroid   TIA (transient ischemic attack) 11/09/2020   Past Surgical History:  Procedure Laterality Date   APPENDECTOMY     CATARACT EXTRACTION W/PHACO Right 05/21/2019   Procedure: CATARACT EXTRACTION PHACO AND INTRAOCULAR LENS PLACEMENT (IOC) RIGHT panoptix lens  00:35.0  21.7%  7.61;  Surgeon: Mittie Gaskin, MD;  Location: [facility];  Service: Ophthalmology;  Laterality: Right;  sleep apnea requests early   CATARACT EXTRACTION W/PHACO Left 06/11/2019   Procedure: CATARACT EXTRACTION PHACO AND INTRAOCULAR LENS PLACEMENT (IOC) LEFT PANOPTIX TORIC LENS  00:50.0  20.3%  10.21;  Surgeon: Mittie Gaskin, MD;  Location: [facility];  Service: Ophthalmology;  Laterality: Left;  sleep  apnea requests early   COLONOSCOPY     last 2012 - [facility]   ESOPHAGOGASTRODUODENOSCOPY     Multiple, last 01/07/2017 with Savary dilation to 18 mm   EYE SURGERY  August 2022   Cataracs removed   FRACTURE SURGERY  2025   HEMORRHOID BANDING  2018   [facility]   INTRAMEDULLARY (IM) NAIL INTERTROCHANTERIC Right 09/06/2023   Procedure: INTRAMEDULLARY (IM) NAIL INTERTROCHANTERIC;  Surgeon: Celena Sharper, MD;  Location: [facility];  Service: Orthopedics;  Laterality: Right;   LUMBAR DISC SURGERY  1984   L5    Patient Active Problem List   Diagnosis Date Noted   Vitamin D  deficiency 01/29/2024   Closed right hip fracture (HCC) 09/05/2023   Current use of proton pump inhibitor 03/13/2023   Pain in joint, lower leg 01/30/2023   Migraine    Essential hypertension    H/O: CVA (cerebrovascular accident) 11/08/2020   Colon cancer screening 02/05/2018   Degenerative joint disease (DJD) of lumbar spine 05/02/2017   Internal hemorrhoids 05/02/2017   Generalized osteoarthritis of hand 12/11/2016   GAD (generalized anxiety disorder) 12/11/2016   Estrogen deficiency 05/11/2016   Encounter for screening mammogram for breast cancer 05/11/2016   OSA (obstructive sleep apnea) 03/06/2016   Left knee pain 10/18/2015   Snoring 10/18/2015   Routine general medical examination at  a health care facility 02/28/2015   Hyperlipidemia 08/27/2012   Osteopenia 12/17/2009   BACK PAIN, LUMBAR 07/02/2009   IRRITABLE BOWEL SYNDROME 04/02/2009   Asymptomatic postmenopausal status 12/10/2008   Hypothyroidism 12/05/2007   Meniere's disease 12/05/2007   GERD 12/05/2007    PCP: Dr. Laine Balls  REFERRING PROVIDER: Dr. Ozell Bruch  REFERRING DIAG: R hip fracture  THERAPY DIAG:  Abnormality of gait and mobility  Muscle weakness (generalized)  Difficulty in walking, not elsewhere classified  Other abnormalities of gait and mobility  Pain in right hip  Other lack of coordination  Rationale for Evaluation  and Treatment: Rehabilitation  ONSET DATE: 09/06/2023  SUBJECTIVE:   SUBJECTIVE STATEMENT: From Today: Patient reports having a good weekend without any new issues.    From Eval:  Patient reports feeling better overall and states her MD wants her back in PT to work her out and strengthen her hip and work on balance. She denies any recent falls and reports her hip is feeling better since she was in PT back in June.   PERTINENT HISTORY: Sarah Harper is a 83 year old female with past medical history significant for HTN, hypothyroidism, GERD, osteopenia, dyslipidemia, OSA who presented to [facility] ED on 1/15 fall mechanical fall at home sustaining injury to her right hip. Patient underwent intramedullary nailing of the right hip 09/06/2023 by Dr. Bruch. Patient was receiving outpatient PT in May however was admitted back to hospital with Hyponatremia and acute metabolic encephalopathy. She has continued to be followed since release from hospital by Dr. Bruch for ongoing hip pain and received a bone stimulator/ultrasound device to help with non-healing hip and -Ultrasound/bone stimulator treatments 2 sets of 6 week treatment. Has competed 80/90 treatemnt and has f/u with Dr. Bruch on 10/29.  PAIN:  Are you having pain? Yes: NPRS scale: 2/10  Pain location: Right posterior superior gluteal Pain description: aggravating achy- constant Aggravating factors: Worse with ultrasound treatment Relieving factors: lying flat  PRECAUTIONS: Other: WBAT RLE and no restrictions per MD order  RED FLAGS: None   WEIGHT BEARING RESTRICTIONS: R LE WBAT  FALLS:  Has patient fallen in last 6 months? No  LIVING ENVIRONMENT: Lives with: lives with their spouse Lives in: House/apartment Stairs: Yes: Internal: 2 steps; on right going up Has following equipment at home: Vannie - 2 wheeled and Grab bars  OCCUPATION: Stage manager  PLOF: Independent  PATIENT GOALS: Hope to be able to walk on my own  without a device and be able to drive independently  NEXT MD VISIT: 06/18/2024- Dr. Bruch  OBJECTIVE:  Note: Objective measures were completed at Evaluation unless otherwise noted.  DIAGNOSTIC FINDINGS:  ADDENDUM REPORT: 03/05/2024 08:07   ADDENDUM: The original report was by Dr. Ryan Salvage. The following addendum is by Dr. Ryan Salvage:   With respect to further specifics on the intertrochanteric fracture, the original fracture as shown on the radiographs from 09/05/2023 does include a small comminuted avulsion of the lesser trochanter, with a component measuring 2.4 by 1.0 by 0.6 cm, shown for example on image 63 series 5 and image 102 series 2. It is conceivable that the lucency described as likely hyperemic could have been a donor site for this fragment, although the fragment is about 1.9 cm posterior to this lucency, and the natural action of the iliopsoas would tend to pull the fragment anteriorly and cephalad rather than posteriorly.   With respect to infection as a cause for the lucency, I am skeptical given the  lack of abnormal lucency extending along the hardware, although if the patient is experiencing fever and signs of infection then close monitoring would be recommended.     Electronically Signed   By: Ryan Salvage M.D.   On: 03/05/2024 08:07    Addended by Salvage Sawyers, MD on 03/05/2024  8:09 AM    Study Result  Narrative & Impression  CLINICAL DATA:  Right SI joint pain. Prior right hip intertrochanteric fracture ORIF in January 2025.   EXAM: CT PELVIS WITHOUT CONTRAST   TECHNIQUE: Multidetector CT imaging of the pelvis was performed following the standard protocol without intravenous contrast.   RADIATION DOSE REDUCTION: This exam was performed according to the departmental dose-optimization program which includes automated exposure control, adjustment of the mA and/or kV according to patient size and/or use of iterative  reconstruction technique.   COMPARISON:  Multiple exams, including radiographs 09/05/2023   FINDINGS: OSSEOUS STRUCTURES AND JOINTS:   Lower lumbar degenerative facet arthropathy. No SI joint diastasis. A small amount of nitrogen gas phenomenon in both SI joints without SI joint effusion observed. No erosions or other specific abnormalities along the SI joints aside from mild spurring.   No pelvic fracture observed.   Right femoral nail system in place with single distal interlocking screw and femoral head screw noted. This traverses the intertrochanteric fracture which is still visible, with notable lucency along part of the medial fracture margin on image 52 series 5 and image 99 series 2 probably from hyperemic resorption of bone in this local vicinity. There is potentially some early bridging of bone anteriorly at the fracture site. Early cortication along the posterior portion of the fracture may suggest early nonunion of the posterior portion for example as on image 62 series 5.   No abnormal lucency around the components of the nail system to indicate loosening or infection.   MUSCULOTENDINOUS:   Unremarkable   SACRAL PLEXUS:   No impinging lesion along the sacral plexus or proximal sciatic nerves.   OTHER:   Prominent stool throughout the colon favors constipation. Mild degenerative arthropathy of the left (contralateral) hip.   IMPRESSION: 1. No specific abnormality is identified to explain the patient's right SI joint pain. 2. Right femoral nail system in place traversing the intertrochanteric fracture. Questionable partial early nonunion of the posterior portion of the fracture anterior bridging is appreciated. There is some accentuated lucency along the medial portion of the fracture probably from hyperemic resorption of bone, but no abnormal extension of lucency along components of the IM nail or femoral head nail to suggest loosening/infection. 3.  Prominent stool throughout the colon favors constipation. 4. Mild degenerative arthropathy of the left (contralateral) hip. 5. Lower lumbar degenerative facet arthropathy.   Electronically Signed: By: Ryan Salvage M.D. On: 01/15/2024 16:54     PATIENT SURVEYS:  ABC scale: The Activities-Specific Balance Confidence (ABC) Scale 0% 10 20 30  40 50 60 70 80 90 100% No confidence<->completely confident  "How confident are you that you will not lose your balance or become unsteady when you . . .   Date tested 06/10/2024  Walk around the house 80%  2. Walk up or down stairs 20%  3. Bend over and pick up a slipper from in front of a closet floor 30%  4. Reach for a small can off a shelf at eye level 80%  5. Stand on tip toes and reach for something above your head 20%  6. Stand on a chair and reach for  something 0%  7. Sweep the floor 60%  8. Walk outside the house to a car parked in the driveway 50%  9. Get into or out of a car 50%  10. Walk across a parking lot to the mall 10%  11. Walk up or down a ramp 50%  12. Walk in a crowded mall where people rapidly walk past you 10%  13. Are bumped into by people as you walk through the mall 10%  14. Step onto or off of an escalator while you are holding onto the railing 20%  15. Step onto or off an escalator while holding onto parcels such that you cannot hold onto the railing 10%  16. Walk outside on icy sidewalks 0%  Total: #/16 26.25%     COGNITION: Overall cognitive status: Within functional limits for tasks assessed     SENSATION: WFL   POSTURE: rounded shoulders and forward head  PALPATION: (+) tenderness (lateral hip and posteior right off edge of R sided sacrum)   LOWER EXTREMITY ROM:  Active ROM Right eval Left eval  Hip flexion    Hip extension    Hip abduction    Hip adduction    Hip internal rotation    Hip external rotation    Knee flexion    Knee extension    Ankle dorsiflexion    Ankle  plantarflexion    Ankle inversion    Ankle eversion     (Blank rows = not tested)  LOWER EXTREMITY MMT:  MMT Right eval Left eval  Hip flexion 4 4+  Hip extension    Hip abduction 4 4+  Hip adduction    Hip internal rotation 4 4+  Hip external rotation 4 4+  Knee flexion 4 4  Knee extension 4 4  Ankle dorsiflexion 4+ 4+  Ankle plantarflexion    Ankle inversion    Ankle eversion     (Blank rows = not tested)  LOWER EXTREMITY SPECIAL TESTS:  None  FUNCTIONAL TESTS:  30 seconds chair stand test= Unable to stand without UE support today but did perform 10 reps with min UE support Timed up and go (TUG): 15.10 and 15.33=15.2 sec with RW 6 minute walk test: to be assessed next visit. 10 meter walk test: 12.10 and 11.69 sec =0.85 m/s avg with RW  Berg Balance Scale:  Item Test date: 06/10/2024 Date:  Date:   Sitting to standing 3. able to stand independently using hands Insert SmartPhrase OPRCBERGREEVAL Insert SmartPhrase OPRCBERGREEVAL  2. Standing unsupported 4. able to stand safely for 2 minutes    3. Sitting with back unsupported, feet supported 4. able to sit safely and securely for 2 minutes    4. Standing to sitting 3. controls descent by using hands    5. Pivot transfer  3. able to transfer safely with definite need of hands    6. Standing unsupported with eyes closed 4. able to stand 10 seconds safely    7. Standing unsupported with feet together 4. able to place feet together independently and stand 1 minute safely    8. Reaching forward with outstretched arms while standing 4. can reach forward confidently 25 cm (10 inches)    9. Pick up object from the floor from standing 3. able to pick up slipper but needs supervision    10. Turning to look behind over left and right shoulders while standing 2. turns sideways but only maintains balance    11. Turn 360 degrees 2. able  to turn 360 degrees safely but slowly    12. Place alternate foot on step or stool while standing  unsupported 3. able to stand independently and complete 8 steps in > 20 seconds     13. Standing unsupported one foot in front 2. able to take small step independently and hold 30 seconds    14. Standing on one leg 1. tries to lift leg unable to hold 3 seconds but remains standing independently.      Total Score 42/56 Total Score:    Total Score:      GAIT: Distance walked: 150 feet Assistive device utilized: Walker - 2 wheeled Level of assistance: CGA Comments: Mild antalgia noted on right and decreased step length - short reciprocal steps with use of RW                                                                                                                                TREATMENT DATE: 06/30/2024    NMR:  Step taps onto 4 step x 20 reps each LE Side step over 4 step x 15 reps with min to no UE support Tandem standing x 15 sec x 3 each LE- (increased unsteadiness) Near tandem walking in // bars-down/back x 10   TE:  -calf raises on incline (3#AW) 3 x 10 reps BLE -Ankle DF on 1/2 foam roll 3 x 10 reps BLE -Seated Hip abd(ball squeeze) hold 5 sec x 10  -Seated LAQ (3#)- 2 x 10 each LE    TA:  Hip march + Hip ER to step tap onto 4 step x 15 reps each LE Resistive Hip IR/ER with RTB - hip swings 2 x 10 reps each (Patient reported feeling it in the Right hip)  Side step up/down on opp side- 3# x 10 reps each Side Resistive walking 3# AW each LE x 475 feet with RW Sit to stand with overhead ball raise x 10 reps    PATIENT EDUCATION:  Education details: rationale for 6 min walk and instruction in HEP Person educated: Patient and Spouse Education method: Explanation Education comprehension: verbalized understanding  HOME EXERCISE PROGRAM: Access Code: [email or web address] URL: [email or web address] Date: 06/16/2024 Prepared by: Reyes London  Exercises - Single Leg Stance  - 3 x weekly - 5-10 sets - up to 20 sec hold - Standing Hip Flexion March  - 3 x  weekly - 3 sets - 10 reps - 2 sec hold - Tandem Stance  - 3 x weekly - 5 sets - 30 sec  hold - Side Stepping with Resistance at Ankles  - 3 x weekly - 3 sets - 10 reps - Sit to Stand with Arms Crossed  - 3 x weekly - 3 sets - 10 reps  ASSESSMENT:  CLINICAL IMPRESSION: Patient is a 83 y.o. female  who was seen today for physical therapy treatment for history of right hip fracture. Patient performed well initially - incorporating many  hip strengthening and functional activities along with some balance. She began to feel some R hip soreness mid treatment so concentrated on some seated LE strengthening and finished with a few more balance activities without any report of any worsening pain.  Patient will benefit from skilled PT services to address her LE muscle weakness and restore independent mobility for improved functional capabilities in the home and decrease her risk of falling.   OBJECTIVE IMPAIRMENTS: Abnormal gait, cardiopulmonary status limiting activity, decreased activity tolerance, decreased balance, decreased coordination, decreased endurance, decreased mobility, difficulty walking, decreased strength, and pain.   ACTIVITY LIMITATIONS: carrying, lifting, bending, sitting, standing, squatting, stairs, and transfers  PARTICIPATION LIMITATIONS: meal prep, cleaning, laundry, driving, shopping, community activity, and yard work  PERSONAL FACTORS: 1-2 comorbidities: HTN, Arthritis are also affecting patient's functional outcome.   REHAB POTENTIAL: Good  CLINICAL DECISION MAKING: Stable/uncomplicated  EVALUATION COMPLEXITY: Low   GOALS: Goals reviewed with patient? Yes  SHORT TERM GOALS: Target date: 07/22/2024 Pt will be independent with HEP in order to improve strength and balance in order to decrease fall risk and improve function at home. Baseline: EVAL- Patient reports has not been performing any former HEP since her hospitalization in June Goal status: INITIAL LONG TERM GOALS:  Target date: 09/02/2024  Patient will report < 4/10 Right hip pain at worst with all functional activities.  Baseline: EVAL= worst = 7/10 Goal status: INITIAL  2.  Patient will complete >12 reps without use of UE support in 30 second sit to stand test indicating an increased LE strength and improved balance. Baseline: EVAL: Unable to stand without UE support today but did perform 10 reps with min UE support Goal status: INITIAL  3.  Patient will improve ABC score by 15 points to demonstrate statistically significant improvement in mobility and quality of life as it relates to their confidence in their balance.  Baseline: EVAL: 26.25 Goal status: INITIAL   4.  Patient will increase Berg Balance score by > 6 points to demonstrate decreased fall risk during functional activities. Baseline: EVAL= 42/56 Goal status: INITIAL   5.   Patient will reduce timed up and go to <11 seconds to reduce fall risk and demonstrate improved transfer/gait ability. Baseline: EVAL= 15.2 sec with RW Goal status: INITIAL  6.   Patient will increase 10 meter walk test to >1.69m/s as to improve gait speed for better community ambulation and to reduce fall risk. Baseline: EVAL= 0.85 m/s with RW Goal status: INITIAL  7.   Patient will increase six minute walk test distance to >1000 for progression to community ambulator and improve gait ability Baseline: Eval= To be completed at visit #2; 06/16/2024= 805 feet with RW Goal status: INITIAL    PLAN:  PT FREQUENCY: 1-2x/week  PT DURATION: 12 weeks  PLANNED INTERVENTIONS: [id number]- PT Re-evaluation, 97750- Physical Performance Testing, 97110-Therapeutic exercises, 97530- Therapeutic activity, [id number]- Neuromuscular re-education, 97535- Self Care, [id number]- Manual therapy, [id number]- Gait training, [id number]- Orthotic Initial, [id number]- Orthotic/Prosthetic subsequent, [phone]- Canalith repositioning, [id number]- Electrical stimulation (manual), [phone] (1-2 muscles), 20561 (3+ muscles)- Dry  Needling, Patient/Family education, Balance training, Stair training, Taping, Joint mobilization, Joint manipulation, Spinal mobilization, Vestibular training, DME instructions, Cryotherapy, and Moist heat  PLAN FOR NEXT SESSION:   R hip strengthening Gait training with cane Balance training Add to HEP as appropriate.    Reyes LOISE London, PT 06/30/2024, 12:13 PM

## 2024-07-02 ENCOUNTER — Ambulatory Visit: Admitting: Physical Therapy

## 2024-07-02 DIAGNOSIS — R2689 Other abnormalities of gait and mobility: Secondary | ICD-10-CM

## 2024-07-02 DIAGNOSIS — M6281 Muscle weakness (generalized): Secondary | ICD-10-CM

## 2024-07-02 DIAGNOSIS — R262 Difficulty in walking, not elsewhere classified: Secondary | ICD-10-CM

## 2024-07-02 DIAGNOSIS — R269 Unspecified abnormalities of gait and mobility: Secondary | ICD-10-CM

## 2024-07-02 NOTE — Therapy (Signed)
 OUTPATIENT PHYSICAL THERAPY LOWER EXTREMITY TREATMENT   Patient Name: Sarah Harper MRN: [id number] DOB:[birth year], 83 y.o., female Today's Date: 07/02/2024  END OF SESSION:  PT End of Session - 07/02/24 1101     Visit Number 6    Number of Visits 24    Date for Recertification  09/03/23    Progress Note Due on Visit 10    PT Start Time 1017    PT Stop Time 1058    PT Time Calculation (min) 41 min    Equipment Utilized During Treatment Gait belt    Activity Tolerance Patient tolerated treatment well    Behavior During Therapy WFL for tasks assessed/performed            Past Medical History:  Diagnosis Date   Anxiety 2022   Arthritis    Blood loss anemia 09/13/2023   Cataract 1997   Colon cancer screening 02/05/2018   DDD (degenerative disc disease)    chronic back pain   ETD (eustachian tube dysfunction)    GERD (gastroesophageal reflux disease)    Hypertension 2022   IBS (irritable bowel syndrome)    Meniere's disease    Migraine    still gets visual aura from time to time   Mild cognitive impairment    Osteoporosis    Sleep apnea 2017   CPAP   Stroke (HCC) 2022   Syncopal episodes    after work-up - possible seizures?   Thyroid  disease 1990   hypothyroid   TIA (transient ischemic attack) 11/09/2020   Past Surgical History:  Procedure Laterality Date   APPENDECTOMY     CATARACT EXTRACTION W/PHACO Right 05/21/2019   Procedure: CATARACT EXTRACTION PHACO AND INTRAOCULAR LENS PLACEMENT (IOC) RIGHT panoptix lens  00:35.0  21.7%  7.61;  Surgeon: Sarah Gaskin, MD;  Location: [facility];  Service: Ophthalmology;  Laterality: Right;  sleep apnea requests early   CATARACT EXTRACTION W/PHACO Left 06/11/2019   Procedure: CATARACT EXTRACTION PHACO AND INTRAOCULAR LENS PLACEMENT (IOC) LEFT PANOPTIX TORIC LENS  00:50.0  20.3%  10.21;  Surgeon: Sarah Gaskin, MD;  Location: [facility];  Service: Ophthalmology;  Laterality: Left;   sleep apnea requests early   COLONOSCOPY     last 2012 - [facility]   ESOPHAGOGASTRODUODENOSCOPY     Multiple, last 01/07/2017 with Savary dilation to 18 mm   EYE SURGERY  August 2022   Cataracs removed   FRACTURE SURGERY  2025   HEMORRHOID BANDING  2018   [facility]   INTRAMEDULLARY (IM) NAIL INTERTROCHANTERIC Right 09/06/2023   Procedure: INTRAMEDULLARY (IM) NAIL INTERTROCHANTERIC;  Surgeon: Sarah Sharper, MD;  Location: [facility];  Service: Orthopedics;  Laterality: Right;   LUMBAR DISC SURGERY  1984   L5    Patient Active Problem List   Diagnosis Date Noted   Vitamin D  deficiency 01/29/2024   Closed right hip fracture (HCC) 09/05/2023   Current use of proton pump inhibitor 03/13/2023   Pain in joint, lower leg 01/30/2023   Migraine    Essential hypertension    H/O: CVA (cerebrovascular accident) 11/08/2020   Colon cancer screening 02/05/2018   Degenerative joint disease (DJD) of lumbar spine 05/02/2017   Internal hemorrhoids 05/02/2017   Generalized osteoarthritis of hand 12/11/2016   GAD (generalized anxiety disorder) 12/11/2016   Estrogen deficiency 05/11/2016   Encounter for screening mammogram for breast cancer 05/11/2016   OSA (obstructive sleep apnea) 03/06/2016   Left knee pain 10/18/2015   Snoring 10/18/2015   Routine general medical examination  at a health care facility 02/28/2015   Hyperlipidemia 08/27/2012   Osteopenia 12/17/2009   BACK PAIN, LUMBAR 07/02/2009   IRRITABLE BOWEL SYNDROME 04/02/2009   Asymptomatic postmenopausal status 12/10/2008   Hypothyroidism 12/05/2007   Meniere's disease 12/05/2007   GERD 12/05/2007    PCP: Dr. Laine Harper  REFERRING PROVIDER: Dr. Ozell Harper  REFERRING DIAG: R hip fracture  THERAPY DIAG:  Abnormality of gait and mobility  Muscle weakness (generalized)  Difficulty in walking, not elsewhere classified  Other abnormalities of gait and mobility  Rationale for Evaluation and Treatment: Rehabilitation  ONSET DATE:  09/06/2023  SUBJECTIVE:   SUBJECTIVE STATEMENT: From Today: Patient reports having a good weekend without any new issues.  Was sore after last session but is now recovered.   From Eval:  Patient reports feeling better overall and states her MD wants her back in PT to work her out and strengthen her hip and work on balance. She denies any recent falls and reports her hip is feeling better since she was in PT back in June.   PERTINENT HISTORY: Sarah Harper is a 83 year old female with past medical history significant for HTN, hypothyroidism, GERD, osteopenia, dyslipidemia, OSA who presented to [facility] ED on 1/15 fall mechanical fall at home sustaining injury to her right hip. Patient underwent intramedullary nailing of the right hip 09/06/2023 by Dr. Bruch. Patient was receiving outpatient PT in May however was admitted back to hospital with Hyponatremia and acute metabolic encephalopathy. She has continued to be followed since release from hospital by Dr. Bruch for ongoing hip pain and received a bone stimulator/ultrasound device to help with non-healing hip and -Ultrasound/bone stimulator treatments 2 sets of 6 week treatment. Has competed 80/90 treatemnt and has f/u with Dr. Bruch on 10/29.  PAIN:  Are you having pain? Yes: NPRS scale: 2/10  Pain location: Right posterior superior gluteal Pain description: aggravating achy- constant Aggravating factors: Worse with ultrasound treatment Relieving factors: lying flat  PRECAUTIONS: Other: WBAT RLE and no restrictions per MD order  RED FLAGS: None   WEIGHT BEARING RESTRICTIONS: R LE WBAT  FALLS:  Has patient fallen in last 6 months? No  LIVING ENVIRONMENT: Lives with: lives with their spouse Lives in: House/apartment Stairs: Yes: Internal: 2 steps; on right going up Has following equipment at home: Vannie - 2 wheeled and Grab bars  OCCUPATION: Stage manager  PLOF: Independent  PATIENT GOALS: Hope to be able to walk on my own  without a device and be able to drive independently  NEXT MD VISIT: 06/18/2024- Dr. Bruch  OBJECTIVE:   Note: Objective measures were completed at Evaluation unless otherwise noted.  DIAGNOSTIC FINDINGS:  ADDENDUM REPORT: 03/05/2024 08:07   ADDENDUM: The original report was by Dr. Ryan Salvage. The following addendum is by Dr. Ryan Salvage:   With respect to further specifics on the intertrochanteric fracture, the original fracture as shown on the radiographs from 09/05/2023 does include a small comminuted avulsion of the lesser trochanter, with a component measuring 2.4 by 1.0 by 0.6 cm, shown for example on image 63 series 5 and image 102 series 2. It is conceivable that the lucency described as likely hyperemic could have been a donor site for this fragment, although the fragment is about 1.9 cm posterior to this lucency, and the natural action of the iliopsoas would tend to pull the fragment anteriorly and cephalad rather than posteriorly.   With respect to infection as a cause for the lucency, I am skeptical given  the lack of abnormal lucency extending along the hardware, although if the patient is experiencing fever and signs of infection then close monitoring would be recommended.     Electronically Signed   By: Ryan Salvage M.D.   On: 03/05/2024 08:07    Addended by Salvage Sawyers, MD on 03/05/2024  8:09 AM    Study Result  Narrative & Impression  CLINICAL DATA:  Right SI joint pain. Prior right hip intertrochanteric fracture ORIF in January 2025.   EXAM: CT PELVIS WITHOUT CONTRAST   TECHNIQUE: Multidetector CT imaging of the pelvis was performed following the standard protocol without intravenous contrast.   RADIATION DOSE REDUCTION: This exam was performed according to the departmental dose-optimization program which includes automated exposure control, adjustment of the mA and/or kV according to patient size and/or use of iterative  reconstruction technique.   COMPARISON:  Multiple exams, including radiographs 09/05/2023   FINDINGS: OSSEOUS STRUCTURES AND JOINTS:   Lower lumbar degenerative facet arthropathy. No SI joint diastasis. A small amount of nitrogen gas phenomenon in both SI joints without SI joint effusion observed. No erosions or other specific abnormalities along the SI joints aside from mild spurring.   No pelvic fracture observed.   Right femoral nail system in place with single distal interlocking screw and femoral head screw noted. This traverses the intertrochanteric fracture which is still visible, with notable lucency along part of the medial fracture margin on image 52 series 5 and image 99 series 2 probably from hyperemic resorption of bone in this local vicinity. There is potentially some early bridging of bone anteriorly at the fracture site. Early cortication along the posterior portion of the fracture may suggest early nonunion of the posterior portion for example as on image 62 series 5.   No abnormal lucency around the components of the nail system to indicate loosening or infection.   MUSCULOTENDINOUS:   Unremarkable   SACRAL PLEXUS:   No impinging lesion along the sacral plexus or proximal sciatic nerves.   OTHER:   Prominent stool throughout the colon favors constipation. Mild degenerative arthropathy of the left (contralateral) hip.   IMPRESSION: 1. No specific abnormality is identified to explain the patient's right SI joint pain. 2. Right femoral nail system in place traversing the intertrochanteric fracture. Questionable partial early nonunion of the posterior portion of the fracture anterior bridging is appreciated. There is some accentuated lucency along the medial portion of the fracture probably from hyperemic resorption of bone, but no abnormal extension of lucency along components of the IM nail or femoral head nail to suggest loosening/infection. 3.  Prominent stool throughout the colon favors constipation. 4. Mild degenerative arthropathy of the left (contralateral) hip. 5. Lower lumbar degenerative facet arthropathy.   Electronically Signed: By: Ryan Salvage M.D. On: 01/15/2024 16:54     PATIENT SURVEYS:  ABC scale: The Activities-Specific Balance Confidence (ABC) Scale 0% 10 20 30  40 50 60 70 80 90 100% No confidence<->completely confident  "How confident are you that you will not lose your balance or become unsteady when you . . .   Date tested 06/10/2024  Walk around the house 80%  2. Walk up or down stairs 20%  3. Bend over and pick up a slipper from in front of a closet floor 30%  4. Reach for a small can off a shelf at eye level 80%  5. Stand on tip toes and reach for something above your head 20%  6. Stand on a chair and reach  for something 0%  7. Sweep the floor 60%  8. Walk outside the house to a car parked in the driveway 50%  9. Get into or out of a car 50%  10. Walk across a parking lot to the mall 10%  11. Walk up or down a ramp 50%  12. Walk in a crowded mall where people rapidly walk past you 10%  13. Are bumped into by people as you walk through the mall 10%  14. Step onto or off of an escalator while you are holding onto the railing 20%  15. Step onto or off an escalator while holding onto parcels such that you cannot hold onto the railing 10%  16. Walk outside on icy sidewalks 0%  Total: #/16 26.25%     COGNITION: Overall cognitive status: Within functional limits for tasks assessed     SENSATION: WFL   POSTURE: rounded shoulders and forward head  PALPATION: (+) tenderness (lateral hip and posteior right off edge of R sided sacrum)   LOWER EXTREMITY ROM:  Active ROM Right eval Left eval  Hip flexion    Hip extension    Hip abduction    Hip adduction    Hip internal rotation    Hip external rotation    Knee flexion    Knee extension    Ankle dorsiflexion    Ankle  plantarflexion    Ankle inversion    Ankle eversion     (Blank rows = not tested)  LOWER EXTREMITY MMT:  MMT Right eval Left eval  Hip flexion 4 4+  Hip extension    Hip abduction 4 4+  Hip adduction    Hip internal rotation 4 4+  Hip external rotation 4 4+  Knee flexion 4 4  Knee extension 4 4  Ankle dorsiflexion 4+ 4+  Ankle plantarflexion    Ankle inversion    Ankle eversion     (Blank rows = not tested)  LOWER EXTREMITY SPECIAL TESTS:  None  FUNCTIONAL TESTS:  30 seconds chair stand test= Unable to stand without UE support today but did perform 10 reps with min UE support Timed up and go (TUG): 15.10 and 15.33=15.2 sec with RW 6 minute walk test: to be assessed next visit. 10 meter walk test: 12.10 and 11.69 sec =0.85 m/s avg with RW  Berg Balance Scale:  Item Test date: 06/10/2024 Date:  Date:   Sitting to standing 3. able to stand independently using hands Insert SmartPhrase OPRCBERGREEVAL Insert SmartPhrase OPRCBERGREEVAL  2. Standing unsupported 4. able to stand safely for 2 minutes    3. Sitting with back unsupported, feet supported 4. able to sit safely and securely for 2 minutes    4. Standing to sitting 3. controls descent by using hands    5. Pivot transfer  3. able to transfer safely with definite need of hands    6. Standing unsupported with eyes closed 4. able to stand 10 seconds safely    7. Standing unsupported with feet together 4. able to place feet together independently and stand 1 minute safely    8. Reaching forward with outstretched arms while standing 4. can reach forward confidently 25 cm (10 inches)    9. Pick up object from the floor from standing 3. able to pick up slipper but needs supervision    10. Turning to look behind over left and right shoulders while standing 2. turns sideways but only maintains balance    11. Turn 360 degrees 2.  able to turn 360 degrees safely but slowly    12. Place alternate foot on step or stool while standing  unsupported 3. able to stand independently and complete 8 steps in > 20 seconds     13. Standing unsupported one foot in front 2. able to take small step independently and hold 30 seconds    14. Standing on one leg 1. tries to lift leg unable to hold 3 seconds but remains standing independently.      Total Score 42/56 Total Score:    Total Score:      GAIT: Distance walked: 150 feet Assistive device utilized: Walker - 2 wheeled Level of assistance: CGA Comments: Mild antalgia noted on right and decreased step length - short reciprocal steps with use of RW                                                                                                                                TREATMENT DATE: 07/02/24     NMR:   Step taps standing on airex x 10 ea LE  1 LE airex other on step 2 x 30-40 sec ea LE   Unless otherwise stated, CGA was provided and gait belt donned in order to ensure pt safety  TE:   -Seated Hip abd(ball squeeze) hold 5 sec x 20  -Seated LAQ (3#)- 2 x 10 each LE- cues for full ROM  TA:   Resistive walking 3# AW each LE x 175  feet with RW Sit to stand with 3KG  ball x 8 reps  Resistive walking 3# AW each LE x 175  feet with RW Sit to stand with 3KG ball x 8 reps Hip march with ER over 1/2 foam roller with 3# AW 2 x 10 ea LE  Side step up/down on opp side- 3# 2 x 10 reps each Side Calf raises on incline (3#AW) 3 x 12 reps BLE with UE support for balance   PATIENT EDUCATION:  Education details: rationale for 6 min walk and instruction in HEP Person educated: Patient and Spouse Education method: Explanation Education comprehension: verbalized understanding  HOME EXERCISE PROGRAM: Access Code: [email or web address] URL: [email or web address] Date: 06/16/2024 Prepared by: Reyes London  Exercises - Single Leg Stance  - 3 x weekly - 5-10 sets - up to 20 sec hold - Standing Hip Flexion March  - 3 x weekly - 3 sets - 10 reps - 2 sec hold - Tandem  Stance  - 3 x weekly - 5 sets - 30 sec  hold - Side Stepping with Resistance at Ankles  - 3 x weekly - 3 sets - 10 reps - Sit to Stand with Arms Crossed  - 3 x weekly - 3 sets - 10 reps  ASSESSMENT:  CLINICAL IMPRESSION:  Patient arrived with good motivation for completion of pt activities. Pt completes all activities well but requires visual and verbal cues for proper performance at times. Pt progressing  with hip and general LE strength, still fatigues easily requiring rest breaks between exercises. Pt will continue to benefit from skilled physical therapy intervention to address impairments, improve QOL, and attain therapy goals.    OBJECTIVE IMPAIRMENTS: Abnormal gait, cardiopulmonary status limiting activity, decreased activity tolerance, decreased balance, decreased coordination, decreased endurance, decreased mobility, difficulty walking, decreased strength, and pain.   ACTIVITY LIMITATIONS: carrying, lifting, bending, sitting, standing, squatting, stairs, and transfers  PARTICIPATION LIMITATIONS: meal prep, cleaning, laundry, driving, shopping, community activity, and yard work  PERSONAL FACTORS: 1-2 comorbidities: HTN, Arthritis are also affecting patient's functional outcome.   REHAB POTENTIAL: Good  CLINICAL DECISION MAKING: Stable/uncomplicated  EVALUATION COMPLEXITY: Low   GOALS: Goals reviewed with patient? Yes  SHORT TERM GOALS: Target date: 07/22/2024 Pt will be independent with HEP in order to improve strength and balance in order to decrease fall risk and improve function at home. Baseline: EVAL- Patient reports has not been performing any former HEP since her hospitalization in June Goal status: INITIAL LONG TERM GOALS: Target date: 09/02/2024  Patient will report < 4/10 Right hip pain at worst with all functional activities.  Baseline: EVAL= worst = 7/10 Goal status: INITIAL  2.  Patient will complete >12 reps without use of UE support in 30 second sit to stand  test indicating an increased LE strength and improved balance. Baseline: EVAL: Unable to stand without UE support today but did perform 10 reps with min UE support Goal status: INITIAL  3.  Patient will improve ABC score by 15 points to demonstrate statistically significant improvement in mobility and quality of life as it relates to their confidence in their balance.  Baseline: EVAL: 26.25 Goal status: INITIAL   4.  Patient will increase Berg Balance score by > 6 points to demonstrate decreased fall risk during functional activities. Baseline: EVAL= 42/56 Goal status: INITIAL   5.   Patient will reduce timed up and go to <11 seconds to reduce fall risk and demonstrate improved transfer/gait ability. Baseline: EVAL= 15.2 sec with RW Goal status: INITIAL  6.   Patient will increase 10 meter walk test to >1.66m/s as to improve gait speed for better community ambulation and to reduce fall risk. Baseline: EVAL= 0.85 m/s with RW Goal status: INITIAL  7.   Patient will increase six minute walk test distance to >1000 for progression to community ambulator and improve gait ability Baseline: Eval= To be completed at visit #2; 06/16/2024= 805 feet with RW Goal status: INITIAL    PLAN:  PT FREQUENCY: 1-2x/week  PT DURATION: 12 weeks  PLANNED INTERVENTIONS: [id number]- PT Re-evaluation, 97750- Physical Performance Testing, 97110-Therapeutic exercises, 97530- Therapeutic activity, [id number]- Neuromuscular re-education, 97535- Self Care, [id number]- Manual therapy, [id number]- Gait training, [id number]- Orthotic Initial, [id number]- Orthotic/Prosthetic subsequent, [phone]- Canalith repositioning, [id number]- Electrical stimulation (manual), [phone] (1-2 muscles), [id number] (3+ muscles)- Dry Needling, Patient/Family education, Balance training, Stair training, Taping, Joint mobilization, Joint manipulation, Spinal mobilization, Vestibular training, DME instructions, Cryotherapy, and Moist heat  PLAN FOR NEXT SESSION:  R hip  strengthening Gait training with cane Balance training Add to HEP as appropriate.    Lonni KATHEE Gainer, PT 07/02/2024, 11:01 AM

## 2024-07-07 ENCOUNTER — Ambulatory Visit

## 2024-07-07 DIAGNOSIS — R269 Unspecified abnormalities of gait and mobility: Secondary | ICD-10-CM | POA: Diagnosis not present

## 2024-07-07 DIAGNOSIS — R278 Other lack of coordination: Secondary | ICD-10-CM

## 2024-07-07 DIAGNOSIS — M25551 Pain in right hip: Secondary | ICD-10-CM

## 2024-07-07 DIAGNOSIS — M6281 Muscle weakness (generalized): Secondary | ICD-10-CM

## 2024-07-07 DIAGNOSIS — R262 Difficulty in walking, not elsewhere classified: Secondary | ICD-10-CM

## 2024-07-07 DIAGNOSIS — R2689 Other abnormalities of gait and mobility: Secondary | ICD-10-CM

## 2024-07-07 NOTE — Therapy (Signed)
 OUTPATIENT PHYSICAL THERAPY LOWER EXTREMITY TREATMENT   Patient Name: [name] MRN: [id number] DOB:04-30-1941, 83 y.o., female Today's Date: 07/07/2024  END OF SESSION:  PT End of Session - 07/07/24 1017     Visit Number 7    Number of Visits 24    Date for Recertification  09/03/23    Progress Note Due on Visit 10    PT Start Time 1017    PT Stop Time 1058    PT Time Calculation (min) 41 min    Equipment Utilized During Treatment Gait belt    Activity Tolerance Patient tolerated treatment well    Behavior During Therapy WFL for tasks assessed/performed            Past Medical History:  Diagnosis Date   Anxiety 2022   Arthritis    Blood loss anemia 09/13/2023   Cataract 1997   Colon cancer screening 02/05/2018   DDD (degenerative disc disease)    chronic back pain   ETD (eustachian tube dysfunction)    GERD (gastroesophageal reflux disease)    Hypertension 2022   IBS (irritable bowel syndrome)    Meniere's disease    Migraine    still gets visual aura from time to time   Mild cognitive impairment    Osteoporosis    Sleep apnea 2017   CPAP   Stroke (HCC) 2022   Syncopal episodes    after work-up - possible seizures?   Thyroid  disease 1990   hypothyroid   TIA (transient ischemic attack) 11/09/2020   Past Surgical History:  Procedure Laterality Date   APPENDECTOMY     CATARACT EXTRACTION W/PHACO Right 05/21/2019   Procedure: CATARACT EXTRACTION PHACO AND INTRAOCULAR LENS PLACEMENT (IOC) RIGHT panoptix lens  00:35.0  21.7%  7.61;  Surgeon: Mittie Gaskin, MD;  Location: [facility];  Service: Ophthalmology;  Laterality: Right;  sleep apnea requests early   CATARACT EXTRACTION W/PHACO Left 06/11/2019   Procedure: CATARACT EXTRACTION PHACO AND INTRAOCULAR LENS PLACEMENT (IOC) LEFT PANOPTIX TORIC LENS  00:50.0  20.3%  10.21;  Surgeon: Mittie Gaskin, MD;  Location: [facility];  Service: Ophthalmology;  Laterality: Left;   sleep apnea requests early   COLONOSCOPY     last 2012 - [facility]   ESOPHAGOGASTRODUODENOSCOPY     Multiple, last 01/07/2017 with Savary dilation to 18 mm   EYE SURGERY  August 2022   Cataracs removed   FRACTURE SURGERY  2025   HEMORRHOID BANDING  2018   [facility]   INTRAMEDULLARY (IM) NAIL INTERTROCHANTERIC Right 09/06/2023   Procedure: INTRAMEDULLARY (IM) NAIL INTERTROCHANTERIC;  Surgeon: Celena Sharper, MD;  Location: [facility];  Service: Orthopedics;  Laterality: Right;   LUMBAR DISC SURGERY  1984   L5    Patient Active Problem List   Diagnosis Date Noted   Vitamin D  deficiency 01/29/2024   Closed right hip fracture (HCC) 09/05/2023   Current use of proton pump inhibitor 03/13/2023   Pain in joint, lower leg 01/30/2023   Migraine    Essential hypertension    H/O: CVA (cerebrovascular accident) 11/08/2020   Colon cancer screening 02/05/2018   Degenerative joint disease (DJD) of lumbar spine 05/02/2017   Internal hemorrhoids 05/02/2017   Generalized osteoarthritis of hand 12/11/2016   GAD (generalized anxiety disorder) 12/11/2016   Estrogen deficiency 05/11/2016   Encounter for screening mammogram for breast cancer 05/11/2016   OSA (obstructive sleep apnea) 03/06/2016   Left knee pain 10/18/2015   Snoring 10/18/2015   Routine general medical examination  at a health care facility 02/28/2015   Hyperlipidemia 08/27/2012   Osteopenia 12/17/2009   BACK PAIN, LUMBAR 07/02/2009   IRRITABLE BOWEL SYNDROME 04/02/2009   Asymptomatic postmenopausal status 12/10/2008   Hypothyroidism 12/05/2007   Meniere's disease 12/05/2007   GERD 12/05/2007    PCP: Dr. Laine Balls  REFERRING PROVIDER: Dr. Ozell Bruch  REFERRING DIAG: R hip fracture  THERAPY DIAG:  Abnormality of gait and mobility  Muscle weakness (generalized)  Difficulty in walking, not elsewhere classified  Other abnormalities of gait and mobility  Pain in right hip  Other lack of coordination  Rationale for  Evaluation and Treatment: Rehabilitation  ONSET DATE: 09/06/2023  SUBJECTIVE:   SUBJECTIVE STATEMENT: From Today: Patient reports doing okay. Had questions about upcoming ortho visit and PT instructed her to contact [facility] to discuss. Reports 2/10   From Eval:  Patient reports feeling better overall and states her MD wants her back in PT to work her out and strengthen her hip and work on balance. She denies any recent falls and reports her hip is feeling better since she was in PT back in June.   PERTINENT HISTORY: Sarah Harper is a 83 year old female with past medical history significant for HTN, hypothyroidism, GERD, osteopenia, dyslipidemia, OSA who presented to [facility] ED on 1/15 fall mechanical fall at home sustaining injury to her right hip. Patient underwent intramedullary nailing of the right hip 09/06/2023 by Dr. Bruch. Patient was receiving outpatient PT in May however was admitted back to hospital with Hyponatremia and acute metabolic encephalopathy. She has continued to be followed since release from hospital by Dr. Bruch for ongoing hip pain and received a bone stimulator/ultrasound device to help with non-healing hip and -Ultrasound/bone stimulator treatments 2 sets of 6 week treatment. Has competed 80/90 treatemnt and has f/u with Dr. Bruch on 10/29.  PAIN:  Are you having pain? Yes: NPRS scale: 2/10  Pain location: Right posterior superior gluteal Pain description: aggravating achy- constant Aggravating factors: Worse with ultrasound treatment Relieving factors: lying flat  PRECAUTIONS: Other: WBAT RLE and no restrictions per MD order  RED FLAGS: None   WEIGHT BEARING RESTRICTIONS: R LE WBAT  FALLS:  Has patient fallen in last 6 months? No  LIVING ENVIRONMENT: Lives with: lives with their spouse Lives in: House/apartment Stairs: Yes: Internal: 2 steps; on right going up Has following equipment at home: Vannie - 2 wheeled and Grab bars  OCCUPATION: Presenter, broadcasting  PLOF: Independent  PATIENT GOALS: Hope to be able to walk on my own without a device and be able to drive independently  NEXT MD VISIT: 06/18/2024- Dr. Bruch  OBJECTIVE:   Note: Objective measures were completed at Evaluation unless otherwise noted.  DIAGNOSTIC FINDINGS:  ADDENDUM REPORT: 03/05/2024 08:07   ADDENDUM: The original report was by Dr. Ryan Salvage. The following addendum is by Dr. Ryan Salvage:   With respect to further specifics on the intertrochanteric fracture, the original fracture as shown on the radiographs from 09/05/2023 does include a small comminuted avulsion of the lesser trochanter, with a component measuring 2.4 by 1.0 by 0.6 cm, shown for example on image 63 series 5 and image 102 series 2. It is conceivable that the lucency described as likely hyperemic could have been a donor site for this fragment, although the fragment is about 1.9 cm posterior to this lucency, and the natural action of the iliopsoas would tend to pull the fragment anteriorly and cephalad rather than posteriorly.   With respect to  infection as a cause for the lucency, I am skeptical given the lack of abnormal lucency extending along the hardware, although if the patient is experiencing fever and signs of infection then close monitoring would be recommended.     Electronically Signed   By: Ryan Salvage M.D.   On: 03/05/2024 08:07    Addended by Salvage Sawyers, MD on 03/05/2024  8:09 AM    Study Result  Narrative & Impression  CLINICAL DATA:  Right SI joint pain. Prior right hip intertrochanteric fracture ORIF in January 2025.   EXAM: CT PELVIS WITHOUT CONTRAST   TECHNIQUE: Multidetector CT imaging of the pelvis was performed following the standard protocol without intravenous contrast.   RADIATION DOSE REDUCTION: This exam was performed according to the departmental dose-optimization program which includes automated exposure control,  adjustment of the mA and/or kV according to patient size and/or use of iterative reconstruction technique.   COMPARISON:  Multiple exams, including radiographs 09/05/2023   FINDINGS: OSSEOUS STRUCTURES AND JOINTS:   Lower lumbar degenerative facet arthropathy. No SI joint diastasis. A small amount of nitrogen gas phenomenon in both SI joints without SI joint effusion observed. No erosions or other specific abnormalities along the SI joints aside from mild spurring.   No pelvic fracture observed.   Right femoral nail system in place with single distal interlocking screw and femoral head screw noted. This traverses the intertrochanteric fracture which is still visible, with notable lucency along part of the medial fracture margin on image 52 series 5 and image 99 series 2 probably from hyperemic resorption of bone in this local vicinity. There is potentially some early bridging of bone anteriorly at the fracture site. Early cortication along the posterior portion of the fracture may suggest early nonunion of the posterior portion for example as on image 62 series 5.   No abnormal lucency around the components of the nail system to indicate loosening or infection.   MUSCULOTENDINOUS:   Unremarkable   SACRAL PLEXUS:   No impinging lesion along the sacral plexus or proximal sciatic nerves.   OTHER:   Prominent stool throughout the colon favors constipation. Mild degenerative arthropathy of the left (contralateral) hip.   IMPRESSION: 1. No specific abnormality is identified to explain the patient's right SI joint pain. 2. Right femoral nail system in place traversing the intertrochanteric fracture. Questionable partial early nonunion of the posterior portion of the fracture anterior bridging is appreciated. There is some accentuated lucency along the medial portion of the fracture probably from hyperemic resorption of bone, but no abnormal extension of lucency along  components of the IM nail or femoral head nail to suggest loosening/infection. 3. Prominent stool throughout the colon favors constipation. 4. Mild degenerative arthropathy of the left (contralateral) hip. 5. Lower lumbar degenerative facet arthropathy.   Electronically Signed: By: Ryan Salvage M.D. On: 01/15/2024 16:54     PATIENT SURVEYS:  ABC scale: The Activities-Specific Balance Confidence (ABC) Scale 0% 10 20 30  40 50 60 70 80 90 100% No confidence<->completely confident  "How confident are you that you will not lose your balance or become unsteady when you . . .   Date tested 06/10/2024  Walk around the house 80%  2. Walk up or down stairs 20%  3. Bend over and pick up a slipper from in front of a closet floor 30%  4. Reach for a small can off a shelf at eye level 80%  5. Stand on tip toes and reach for something above  your head 20%  6. Stand on a chair and reach for something 0%  7. Sweep the floor 60%  8. Walk outside the house to a car parked in the driveway 50%  9. Get into or out of a car 50%  10. Walk across a parking lot to the mall 10%  11. Walk up or down a ramp 50%  12. Walk in a crowded mall where people rapidly walk past you 10%  13. Are bumped into by people as you walk through the mall 10%  14. Step onto or off of an escalator while you are holding onto the railing 20%  15. Step onto or off an escalator while holding onto parcels such that you cannot hold onto the railing 10%  16. Walk outside on icy sidewalks 0%  Total: #/16 26.25%     COGNITION: Overall cognitive status: Within functional limits for tasks assessed     SENSATION: WFL   POSTURE: rounded shoulders and forward head  PALPATION: (+) tenderness (lateral hip and posteior right off edge of R sided sacrum)   LOWER EXTREMITY ROM:  Active ROM Right eval Left eval  Hip flexion    Hip extension    Hip abduction    Hip adduction    Hip internal rotation    Hip external  rotation    Knee flexion    Knee extension    Ankle dorsiflexion    Ankle plantarflexion    Ankle inversion    Ankle eversion     (Blank rows = not tested)  LOWER EXTREMITY MMT:  MMT Right eval Left eval  Hip flexion 4 4+  Hip extension    Hip abduction 4 4+  Hip adduction    Hip internal rotation 4 4+  Hip external rotation 4 4+  Knee flexion 4 4  Knee extension 4 4  Ankle dorsiflexion 4+ 4+  Ankle plantarflexion    Ankle inversion    Ankle eversion     (Blank rows = not tested)  LOWER EXTREMITY SPECIAL TESTS:  None  FUNCTIONAL TESTS:  30 seconds chair stand test= Unable to stand without UE support today but did perform 10 reps with min UE support Timed up and go (TUG): 15.10 and 15.33=15.2 sec with RW 6 minute walk test: to be assessed next visit. 10 meter walk test: 12.10 and 11.69 sec =0.85 m/s avg with RW  Berg Balance Scale:  Item Test date: 06/10/2024 Date:  Date:   Sitting to standing 3. able to stand independently using hands Insert SmartPhrase OPRCBERGREEVAL Insert SmartPhrase OPRCBERGREEVAL  2. Standing unsupported 4. able to stand safely for 2 minutes    3. Sitting with back unsupported, feet supported 4. able to sit safely and securely for 2 minutes    4. Standing to sitting 3. controls descent by using hands    5. Pivot transfer  3. able to transfer safely with definite need of hands    6. Standing unsupported with eyes closed 4. able to stand 10 seconds safely    7. Standing unsupported with feet together 4. able to place feet together independently and stand 1 minute safely    8. Reaching forward with outstretched arms while standing 4. can reach forward confidently 25 cm (10 inches)    9. Pick up object from the floor from standing 3. able to pick up slipper but needs supervision    10. Turning to look behind over left and right shoulders while standing 2. turns sideways but  only maintains balance    11. Turn 360 degrees 2. able to turn 360 degrees  safely but slowly    12. Place alternate foot on step or stool while standing unsupported 3. able to stand independently and complete 8 steps in > 20 seconds     13. Standing unsupported one foot in front 2. able to take small step independently and hold 30 seconds    14. Standing on one leg 1. tries to lift leg unable to hold 3 seconds but remains standing independently.      Total Score 42/56 Total Score:    Total Score:      GAIT: Distance walked: 150 feet Assistive device utilized: Walker - 2 wheeled Level of assistance: CGA Comments: Mild antalgia noted on right and decreased step length - short reciprocal steps with use of RW                                                                                                                                TREATMENT DATE: 07/07/24     NMR:   Step taps standing on airex x 10 ea LE without UE support   Unless otherwise stated, CGA was provided and gait belt donned in order to ensure pt safety  TE:   -Seated Hip abd(ball squeeze) hold 5 sec x 20  -Seated LAQ (3#)- 2 x 10 each LE- cues for full ROM -Standing hamstring Curl 3# AW 2 x 10 reps    TA:  Resistive side stepping along support bar- RTB around distal thighs -Left to right and back (approx 6 feet) x 8 ea direction Resistive walking 3# AW Fwd/back without an AD x 10 feet (counting steps) - initially 9 fwd and 14 back- down to 6 and 10 with 8 rds down and back Standing hip abd- circles - CW (RTB around knees) - swinging leg around yoga block (vertical) 2 x 10 ea LE  Side step up/down on opp side- 3# 2 x 10 reps each Side Calf raises on incline (3#AW) 3 x 12 reps BLE with UE support for balance   PATIENT EDUCATION:  Education details: rationale for 6 min walk and instruction in HEP Person educated: Patient and Spouse Education method: Explanation Education comprehension: verbalized understanding  HOME EXERCISE PROGRAM: Access Code: [email or web address] URL:  [email or web address] Date: 06/16/2024 Prepared by: Reyes London  Exercises - Single Leg Stance  - 3 x weekly - 5-10 sets - up to 20 sec hold - Standing Hip Flexion March  - 3 x weekly - 3 sets - 10 reps - 2 sec hold - Tandem Stance  - 3 x weekly - 5 sets - 30 sec  hold - Side Stepping with Resistance at Ankles  - 3 x weekly - 3 sets - 10 reps - Sit to Stand with Arms Crossed  - 3 x weekly - 3 sets - 10 reps  ASSESSMENT:  CLINICAL IMPRESSION:  Patient continues to  progress with hip and BLE strength, but still fatigues easily requiring rest breaks between exercises. She was able to incorporate more standing functional activities and reports some soreness with increased standing- but still rated overall pain at a 2/10. She was able to follow all VC to progress some hip stability and progressing with weight bearing through RLE.  Pt will continue to benefit from skilled physical therapy intervention to address impairments, improve QOL, and attain therapy goals.    OBJECTIVE IMPAIRMENTS: Abnormal gait, cardiopulmonary status limiting activity, decreased activity tolerance, decreased balance, decreased coordination, decreased endurance, decreased mobility, difficulty walking, decreased strength, and pain.   ACTIVITY LIMITATIONS: carrying, lifting, bending, sitting, standing, squatting, stairs, and transfers  PARTICIPATION LIMITATIONS: meal prep, cleaning, laundry, driving, shopping, community activity, and yard work  PERSONAL FACTORS: 1-2 comorbidities: HTN, Arthritis are also affecting patient's functional outcome.   REHAB POTENTIAL: Good  CLINICAL DECISION MAKING: Stable/uncomplicated  EVALUATION COMPLEXITY: Low   GOALS: Goals reviewed with patient? Yes  SHORT TERM GOALS: Target date: 07/22/2024 Pt will be independent with HEP in order to improve strength and balance in order to decrease fall risk and improve function at home. Baseline: EVAL- Patient reports has  not been performing any former HEP since her hospitalization in June Goal status: INITIAL LONG TERM GOALS: Target date: 09/02/2024  Patient will report < 4/10 Right hip pain at worst with all functional activities.  Baseline: EVAL= worst = 7/10 Goal status: INITIAL  2.  Patient will complete >12 reps without use of UE support in 30 second sit to stand test indicating an increased LE strength and improved balance. Baseline: EVAL: Unable to stand without UE support today but did perform 10 reps with min UE support Goal status: INITIAL  3.  Patient will improve ABC score by 15 points to demonstrate statistically significant improvement in mobility and quality of life as it relates to their confidence in their balance.  Baseline: EVAL: 26.25 Goal status: INITIAL   4.  Patient will increase Berg Balance score by > 6 points to demonstrate decreased fall risk during functional activities. Baseline: EVAL= 42/56 Goal status: INITIAL   5.   Patient will reduce timed up and go to <11 seconds to reduce fall risk and demonstrate improved transfer/gait ability. Baseline: EVAL= 15.2 sec with RW Goal status: INITIAL  6.   Patient will increase 10 meter walk test to >1.69m/s as to improve gait speed for better community ambulation and to reduce fall risk. Baseline: EVAL= 0.85 m/s with RW Goal status: INITIAL  7.   Patient will increase six minute walk test distance to >1000 for progression to community ambulator and improve gait ability Baseline: Eval= To be completed at visit #2; 06/16/2024= 805 feet with RW Goal status: INITIAL    PLAN:  PT FREQUENCY: 1-2x/week  PT DURATION: 12 weeks  PLANNED INTERVENTIONS: [id number]- PT Re-evaluation, 97750- Physical Performance Testing, 97110-Therapeutic exercises, 97530- Therapeutic activity, [id number]- Neuromuscular re-education, 97535- Self Care, [id number]- Manual therapy, [id number]- Gait training, [id number]- Orthotic Initial, [id number]- Orthotic/Prosthetic subsequent, [phone]-  Canalith repositioning, [id number]- Electrical stimulation (manual), [phone] (1-2 muscles), [id number] (3+ muscles)- Dry Needling, Patient/Family education, Balance training, Stair training, Taping, Joint mobilization, Joint manipulation, Spinal mobilization, Vestibular training, DME instructions, Cryotherapy, and Moist heat  PLAN FOR NEXT SESSION:  R hip strengthening Gait training with cane Balance training Add to HEP as appropriate.    Reyes LOISE London, PT 07/07/2024, 11:40 AM

## 2024-07-09 ENCOUNTER — Ambulatory Visit

## 2024-07-09 DIAGNOSIS — R2689 Other abnormalities of gait and mobility: Secondary | ICD-10-CM

## 2024-07-09 DIAGNOSIS — R269 Unspecified abnormalities of gait and mobility: Secondary | ICD-10-CM | POA: Diagnosis not present

## 2024-07-09 DIAGNOSIS — R278 Other lack of coordination: Secondary | ICD-10-CM

## 2024-07-09 DIAGNOSIS — M6281 Muscle weakness (generalized): Secondary | ICD-10-CM

## 2024-07-09 DIAGNOSIS — R262 Difficulty in walking, not elsewhere classified: Secondary | ICD-10-CM

## 2024-07-09 DIAGNOSIS — M25551 Pain in right hip: Secondary | ICD-10-CM

## 2024-07-09 NOTE — Therapy (Signed)
 OUTPATIENT PHYSICAL THERAPY LOWER EXTREMITY TREATMENT   Patient Name: Sarah Harper MRN: [id number] DOB:03/30/1941, 83 y.o., female Today's Date: 07/09/2024  END OF SESSION:  PT End of Session - 07/09/24 1020     Visit Number 8    Number of Visits 24    Date for Recertification  09/03/23    Progress Note Due on Visit 10    PT Start Time 1017    PT Stop Time 1057    PT Time Calculation (min) 40 min    Equipment Utilized During Treatment Gait belt    Activity Tolerance Patient tolerated treatment well    Behavior During Therapy WFL for tasks assessed/performed            Past Medical History:  Diagnosis Date   Anxiety 2022   Arthritis    Blood loss anemia 09/13/2023   Cataract 1997   Colon cancer screening 02/05/2018   DDD (degenerative disc disease)    chronic back pain   ETD (eustachian tube dysfunction)    GERD (gastroesophageal reflux disease)    Hypertension 2022   IBS (irritable bowel syndrome)    Meniere's disease    Migraine    still gets visual aura from time to time   Mild cognitive impairment    Osteoporosis    Sleep apnea 2017   CPAP   Stroke (HCC) 2022   Syncopal episodes    after work-up - possible seizures?   Thyroid  disease 1990   hypothyroid   TIA (transient ischemic attack) 11/09/2020   Past Surgical History:  Procedure Laterality Date   APPENDECTOMY     CATARACT EXTRACTION W/PHACO Right 05/21/2019   Procedure: CATARACT EXTRACTION PHACO AND INTRAOCULAR LENS PLACEMENT (IOC) RIGHT panoptix lens  00:35.0  21.7%  7.61;  Surgeon: Mittie Gaskin, MD;  Location: [facility];  Service: Ophthalmology;  Laterality: Right;  sleep apnea requests early   CATARACT EXTRACTION W/PHACO Left 06/11/2019   Procedure: CATARACT EXTRACTION PHACO AND INTRAOCULAR LENS PLACEMENT (IOC) LEFT PANOPTIX TORIC LENS  00:50.0  20.3%  10.21;  Surgeon: Mittie Gaskin, MD;  Location: [facility];  Service: Ophthalmology;  Laterality: Left;   sleep apnea requests early   COLONOSCOPY     last 2012 - [facility]   ESOPHAGOGASTRODUODENOSCOPY     Multiple, last 01/07/2017 with Savary dilation to 18 mm   EYE SURGERY  August 2022   Cataracs removed   FRACTURE SURGERY  2025   HEMORRHOID BANDING  2018   [facility]   INTRAMEDULLARY (IM) NAIL INTERTROCHANTERIC Right 09/06/2023   Procedure: INTRAMEDULLARY (IM) NAIL INTERTROCHANTERIC;  Surgeon: Celena Sharper, MD;  Location: [facility];  Service: Orthopedics;  Laterality: Right;   LUMBAR DISC SURGERY  1984   L5    Patient Active Problem List   Diagnosis Date Noted   Vitamin D  deficiency 01/29/2024   Closed right hip fracture (HCC) 09/05/2023   Current use of proton pump inhibitor 03/13/2023   Pain in joint, lower leg 01/30/2023   Migraine    Essential hypertension    H/O: CVA (cerebrovascular accident) 11/08/2020   Colon cancer screening 02/05/2018   Degenerative joint disease (DJD) of lumbar spine 05/02/2017   Internal hemorrhoids 05/02/2017   Generalized osteoarthritis of hand 12/11/2016   GAD (generalized anxiety disorder) 12/11/2016   Estrogen deficiency 05/11/2016   Encounter for screening mammogram for breast cancer 05/11/2016   OSA (obstructive sleep apnea) 03/06/2016   Left knee pain 10/18/2015   Snoring 10/18/2015   Routine general medical examination  at a health care facility 02/28/2015   Hyperlipidemia 08/27/2012   Osteopenia 12/17/2009   BACK PAIN, LUMBAR 07/02/2009   IRRITABLE BOWEL SYNDROME 04/02/2009   Asymptomatic postmenopausal status 12/10/2008   Hypothyroidism 12/05/2007   Meniere's disease 12/05/2007   GERD 12/05/2007    PCP: Dr. Laine Balls  REFERRING PROVIDER: Dr. Ozell Bruch  REFERRING DIAG: R hip fracture  THERAPY DIAG:  Abnormality of gait and mobility  Muscle weakness (generalized)  Other abnormalities of gait and mobility  Difficulty in walking, not elsewhere classified  Pain in right hip  Other lack of coordination  Rationale for  Evaluation and Treatment: Rehabilitation  ONSET DATE: 09/06/2023  SUBJECTIVE:   SUBJECTIVE STATEMENT: From Today: Patient reports not too sore after last visit. Reports 2/10 Right lateral hip pain.   From Eval:  Patient reports feeling better overall and states her MD wants her back in PT to work her out and strengthen her hip and work on balance. She denies any recent falls and reports her hip is feeling better since she was in PT back in June.   PERTINENT HISTORY: Sarah Harper is a 83 year old female with past medical history significant for HTN, hypothyroidism, GERD, osteopenia, dyslipidemia, OSA who presented [facility] ED on 1/15 fall mechanical fall at home sustaining injury to her right hip. Patient underwent intramedullary nailing of the right hip 09/06/2023 by Dr. Bruch. Patient was receiving outpatient PT in May however was admitted back to hospital with Hyponatremia and acute metabolic encephalopathy. She has continued to be followed since release from hospital by Dr. Bruch for ongoing hip pain and received a bone stimulator/ultrasound device to help with non-healing hip and -Ultrasound/bone stimulator treatments 2 sets of 6 week treatment. Has competed 80/90 treatemnt and has f/u with Dr. Bruch on 10/29.  PAIN:  Are you having pain? Yes: NPRS scale: 2/10  Pain location: Right posterior superior gluteal Pain description: aggravating achy- constant Aggravating factors: Worse with ultrasound treatment Relieving factors: lying flat  PRECAUTIONS: Other: WBAT RLE and no restrictions per MD order  RED FLAGS: None   WEIGHT BEARING RESTRICTIONS: R LE WBAT  FALLS:  Has patient fallen in last 6 months? No  LIVING ENVIRONMENT: Lives with: lives with their spouse Lives in: House/apartment Stairs: Yes: Internal: 2 steps; on right going up Has following equipment at home: Vannie - 2 wheeled and Grab bars  OCCUPATION: Stage manager  PLOF: Independent  PATIENT GOALS: Hope to  be able to walk on my own without a device and be able to drive independently  NEXT MD VISIT: 06/18/2024- Dr. Bruch  OBJECTIVE:   Note: Objective measures were completed at Evaluation unless otherwise noted.  DIAGNOSTIC FINDINGS:  ADDENDUM REPORT: 03/05/2024 08:07   ADDENDUM: The original report was by Dr. Ryan Salvage. The following addendum is by Dr. Ryan Salvage:   With respect to further specifics on the intertrochanteric fracture, the original fracture as shown on the radiographs from 09/05/2023 does include a small comminuted avulsion of the lesser trochanter, with a component measuring 2.4 by 1.0 by 0.6 cm, shown for example on image 63 series 5 and image 102 series 2. It is conceivable that the lucency described as likely hyperemic could have been a donor site for this fragment, although the fragment is about 1.9 cm posterior to this lucency, and the natural action of the iliopsoas would tend to pull the fragment anteriorly and cephalad rather than posteriorly.   With respect to infection as a cause for the lucency,  I am skeptical given the lack of abnormal lucency extending along the hardware, although if the patient is experiencing fever and signs of infection then close monitoring would be recommended.     Electronically Signed   By: Ryan Salvage M.D.   On: 03/05/2024 08:07    Addended by Salvage Sawyers, MD on 03/05/2024  8:09 AM    Study Result  Narrative & Impression  CLINICAL DATA:  Right SI joint pain. Prior right hip intertrochanteric fracture ORIF in January 2025.   EXAM: CT PELVIS WITHOUT CONTRAST   TECHNIQUE: Multidetector CT imaging of the pelvis was performed following the standard protocol without intravenous contrast.   RADIATION DOSE REDUCTION: This exam was performed according to the departmental dose-optimization program which includes automated exposure control, adjustment of the mA and/or kV according to patient size  and/or use of iterative reconstruction technique.   COMPARISON:  Multiple exams, including radiographs 09/05/2023   FINDINGS: OSSEOUS STRUCTURES AND JOINTS:   Lower lumbar degenerative facet arthropathy. No SI joint diastasis. A small amount of nitrogen gas phenomenon in both SI joints without SI joint effusion observed. No erosions or other specific abnormalities along the SI joints aside from mild spurring.   No pelvic fracture observed.   Right femoral nail system in place with single distal interlocking screw and femoral head screw noted. This traverses the intertrochanteric fracture which is still visible, with notable lucency along part of the medial fracture margin on image 52 series 5 and image 99 series 2 probably from hyperemic resorption of bone in this local vicinity. There is potentially some early bridging of bone anteriorly at the fracture site. Early cortication along the posterior portion of the fracture may suggest early nonunion of the posterior portion for example as on image 62 series 5.   No abnormal lucency around the components of the nail system to indicate loosening or infection.   MUSCULOTENDINOUS:   Unremarkable   SACRAL PLEXUS:   No impinging lesion along the sacral plexus or proximal sciatic nerves.   OTHER:   Prominent stool throughout the colon favors constipation. Mild degenerative arthropathy of the left (contralateral) hip.   IMPRESSION: 1. No specific abnormality is identified to explain the patient's right SI joint pain. 2. Right femoral nail system in place traversing the intertrochanteric fracture. Questionable partial early nonunion of the posterior portion of the fracture anterior bridging is appreciated. There is some accentuated lucency along the medial portion of the fracture probably from hyperemic resorption of bone, but no abnormal extension of lucency along components of the IM nail or femoral head nail to suggest  loosening/infection. 3. Prominent stool throughout the colon favors constipation. 4. Mild degenerative arthropathy of the left (contralateral) hip. 5. Lower lumbar degenerative facet arthropathy.   Electronically Signed: By: Ryan Salvage M.D. On: 01/15/2024 16:54     PATIENT SURVEYS:  ABC scale: The Activities-Specific Balance Confidence (ABC) Scale 0% 10 20 30  40 50 60 70 80 90 100% No confidence<->completely confident  "How confident are you that you will not lose your balance or become unsteady when you . . .   Date tested 06/10/2024  Walk around the house 80%  2. Walk up or down stairs 20%  3. Bend over and pick up a slipper from in front of a closet floor 30%  4. Reach for a small can off a shelf at eye level 80%  5. Stand on tip toes and reach for something above your head 20%  6. Stand on  a chair and reach for something 0%  7. Sweep the floor 60%  8. Walk outside the house to a car parked in the driveway 50%  9. Get into or out of a car 50%  10. Walk across a parking lot to the mall 10%  11. Walk up or down a ramp 50%  12. Walk in a crowded mall where people rapidly walk past you 10%  13. Are bumped into by people as you walk through the mall 10%  14. Step onto or off of an escalator while you are holding onto the railing 20%  15. Step onto or off an escalator while holding onto parcels such that you cannot hold onto the railing 10%  16. Walk outside on icy sidewalks 0%  Total: #/16 26.25%     COGNITION: Overall cognitive status: Within functional limits for tasks assessed     SENSATION: WFL   POSTURE: rounded shoulders and forward head  PALPATION: (+) tenderness (lateral hip and posteior right off edge of R sided sacrum)   LOWER EXTREMITY ROM:  Active ROM Right eval Left eval  Hip flexion    Hip extension    Hip abduction    Hip adduction    Hip internal rotation    Hip external rotation    Knee flexion    Knee extension    Ankle  dorsiflexion    Ankle plantarflexion    Ankle inversion    Ankle eversion     (Blank rows = not tested)  LOWER EXTREMITY MMT:  MMT Right eval Left eval  Hip flexion 4 4+  Hip extension    Hip abduction 4 4+  Hip adduction    Hip internal rotation 4 4+  Hip external rotation 4 4+  Knee flexion 4 4  Knee extension 4 4  Ankle dorsiflexion 4+ 4+  Ankle plantarflexion    Ankle inversion    Ankle eversion     (Blank rows = not tested)  LOWER EXTREMITY SPECIAL TESTS:  None  FUNCTIONAL TESTS:  30 seconds chair stand test= Unable to stand without UE support today but did perform 10 reps with min UE support Timed up and go (TUG): 15.10 and 15.33=15.2 sec with RW 6 minute walk test: to be assessed next visit. 10 meter walk test: 12.10 and 11.69 sec =0.85 m/s avg with RW  Berg Balance Scale:  Item Test date: 06/10/2024 Date:  Date:   Sitting to standing 3. able to stand independently using hands Insert SmartPhrase OPRCBERGREEVAL Insert SmartPhrase OPRCBERGREEVAL  2. Standing unsupported 4. able to stand safely for 2 minutes    3. Sitting with back unsupported, feet supported 4. able to sit safely and securely for 2 minutes    4. Standing to sitting 3. controls descent by using hands    5. Pivot transfer  3. able to transfer safely with definite need of hands    6. Standing unsupported with eyes closed 4. able to stand 10 seconds safely    7. Standing unsupported with feet together 4. able to place feet together independently and stand 1 minute safely    8. Reaching forward with outstretched arms while standing 4. can reach forward confidently 25 cm (10 inches)    9. Pick up object from the floor from standing 3. able to pick up slipper but needs supervision    10. Turning to look behind over left and right shoulders while standing 2. turns sideways but only maintains balance    11.  Turn 360 degrees 2. able to turn 360 degrees safely but slowly    12. Place alternate foot on step or  stool while standing unsupported 3. able to stand independently and complete 8 steps in > 20 seconds     13. Standing unsupported one foot in front 2. able to take small step independently and hold 30 seconds    14. Standing on one leg 1. tries to lift leg unable to hold 3 seconds but remains standing independently.      Total Score 42/56 Total Score:    Total Score:      GAIT: Distance walked: 150 feet Assistive device utilized: Walker - 2 wheeled Level of assistance: CGA Comments: Mild antalgia noted on right and decreased step length - short reciprocal steps with use of RW                                                                                                                                TREATMENT DATE: 07/09/24    Unless otherwise stated, CGA was provided and gait belt donned in order to ensure pt safety  TE:   -Seated Hip abd(ball squeeze) + LAQ hold 5 sec x 20  -Seated resistive hip march with RTB - 2x 10 ea LE -Seated resistive ham curls with RTB - 2x 10 reps ea LE  TA:  Fwd lunge (to be able to reach for items on low shelf or floor)  2 sets of 5 reps.  Rear lunge at support bar with 1 UE support 2 x 5 reps ea LE Standing hip abd- circles - CW (RTB around knees) - swinging leg around yoga block (vertical) 2 x 10 ea LE  Side step  up/down on opp side- 3# 2 x 10 reps each Side Sit to stand holding onto 3Kg ball x 12 reps (improved technique with practice)  Fwd step ups x 12 alt LE without any UE support   PATIENT EDUCATION:  Education details: rationale for 6 min walk and instruction in HEP Person educated: Patient and Spouse Education method: Explanation Education comprehension: verbalized understanding  HOME EXERCISE PROGRAM: Access Code: [email or web address] URL: [email or web address] Date: 06/16/2024 Prepared by: Reyes London  Exercises - Single Leg Stance  - 3 x weekly - 5-10 sets - up to 20 sec hold - Standing Hip Flexion March  - 3 x  weekly - 3 sets - 10 reps - 2 sec hold - Tandem Stance  - 3 x weekly - 5 sets - 30 sec  hold - Side Stepping with Resistance at Ankles  - 3 x weekly - 3 sets - 10 reps - Sit to Stand with Arms Crossed  - 3 x weekly - 3 sets - 10 reps  ASSESSMENT:  CLINICAL IMPRESSION:  Patient continues to progress - able to accept some resistance with sit to stand today and able to perform hip circles with good clearance around yoga block. She endorsed fatigue at  end session yet no pain. Will plan to add more gait activities next week- with either a cane or no device.  Pt will continue to benefit from skilled physical therapy intervention to address impairments, improve QOL, and attain therapy goals.    OBJECTIVE IMPAIRMENTS: Abnormal gait, cardiopulmonary status limiting activity, decreased activity tolerance, decreased balance, decreased coordination, decreased endurance, decreased mobility, difficulty walking, decreased strength, and pain.   ACTIVITY LIMITATIONS: carrying, lifting, bending, sitting, standing, squatting, stairs, and transfers  PARTICIPATION LIMITATIONS: meal prep, cleaning, laundry, driving, shopping, community activity, and yard work  PERSONAL FACTORS: 1-2 comorbidities: HTN, Arthritis are also affecting patient's functional outcome.   REHAB POTENTIAL: Good  CLINICAL DECISION MAKING: Stable/uncomplicated  EVALUATION COMPLEXITY: Low   GOALS: Goals reviewed with patient? Yes  SHORT TERM GOALS: Target date: 07/22/2024 Pt will be independent with HEP in order to improve strength and balance in order to decrease fall risk and improve function at home. Baseline: EVAL- Patient reports has not been performing any former HEP since her hospitalization in June Goal status: INITIAL LONG TERM GOALS: Target date: 09/02/2024  Patient will report < 4/10 Right hip pain at worst with all functional activities.  Baseline: EVAL= worst = 7/10 Goal status: INITIAL  2.  Patient will complete >12  reps without use of UE support in 30 second sit to stand test indicating an increased LE strength and improved balance. Baseline: EVAL: Unable to stand without UE support today but did perform 10 reps with min UE support Goal status: INITIAL  3.  Patient will improve ABC score by 15 points to demonstrate statistically significant improvement in mobility and quality of life as it relates to their confidence in their balance.  Baseline: EVAL: 26.25 Goal status: INITIAL   4.  Patient will increase Berg Balance score by > 6 points to demonstrate decreased fall risk during functional activities. Baseline: EVAL= 42/56 Goal status: INITIAL   5.   Patient will reduce timed up and go to <11 seconds to reduce fall risk and demonstrate improved transfer/gait ability. Baseline: EVAL= 15.2 sec with RW Goal status: INITIAL  6.   Patient will increase 10 meter walk test to >1.65m/s as to improve gait speed for better community ambulation and to reduce fall risk. Baseline: EVAL= 0.85 m/s with RW Goal status: INITIAL  7.   Patient will increase six minute walk test distance to >1000 for progression to community ambulator and improve gait ability Baseline: Eval= To be completed at visit #2; 06/16/2024= 805 feet with RW Goal status: INITIAL    PLAN:  PT FREQUENCY: 1-2x/week  PT DURATION: 12 weeks  PLANNED INTERVENTIONS: [id number]- PT Re-evaluation, 97750- Physical Performance Testing, 97110-Therapeutic exercises, 97530- Therapeutic activity, [id number]- Neuromuscular re-education, 97535- Self Care, [id number]- Manual therapy, [id number]- Gait training, [id number]- Orthotic Initial, [id number]- Orthotic/Prosthetic subsequent, [phone]- Canalith repositioning, [id number]- Electrical stimulation (manual), [phone] (1-2 muscles), [id number] (3+ muscles)- Dry Needling, Patient/Family education, Balance training, Stair training, Taping, Joint mobilization, Joint manipulation, Spinal mobilization, Vestibular training, DME instructions, Cryotherapy, and  Moist heat  PLAN FOR NEXT SESSION:  R hip strengthening Gait training with cane Balance training Add to HEP as appropriate.    Reyes LOISE London, PT 07/09/2024, 11:05 AM

## 2024-07-12 ENCOUNTER — Encounter: Payer: Self-pay | Admitting: Family Medicine

## 2024-07-12 DIAGNOSIS — Z1211 Encounter for screening for malignant neoplasm of colon: Secondary | ICD-10-CM

## 2024-07-13 NOTE — Therapy (Signed)
 OUTPATIENT PHYSICAL THERAPY LOWER EXTREMITY TREATMENT   Patient Name: Sarah Harper MRN: [id number] DOB:18-Feb-1941, 83 y.o., female Today's Date: 07/15/2024  END OF SESSION:  PT End of Session - 07/14/24 1023     Visit Number 9    Number of Visits 24    Date for Recertification  09/03/23    Progress Note Due on Visit 10    PT Start Time 1018    PT Stop Time 1058    PT Time Calculation (min) 40 min    Equipment Utilized During Treatment Gait belt    Activity Tolerance Patient tolerated treatment well    Behavior During Therapy WFL for tasks assessed/performed             Past Medical History:  Diagnosis Date   Anxiety 2022   Arthritis    Blood loss anemia 09/13/2023   Cataract 1997   Colon cancer screening 02/05/2018   DDD (degenerative disc disease)    chronic back pain   ETD (eustachian tube dysfunction)    GERD (gastroesophageal reflux disease)    Hypertension 2022   IBS (irritable bowel syndrome)    Meniere's disease    Migraine    still gets visual aura from time to time   Mild cognitive impairment    Osteoporosis    Sleep apnea 2017   CPAP   Stroke (HCC) 2022   Syncopal episodes    after work-up - possible seizures?   Thyroid  disease 1990   hypothyroid   TIA (transient ischemic attack) 11/09/2020   Past Surgical History:  Procedure Laterality Date   APPENDECTOMY     CATARACT EXTRACTION W/PHACO Right 05/21/2019   Procedure: CATARACT EXTRACTION PHACO AND INTRAOCULAR LENS PLACEMENT (IOC) RIGHT panoptix lens  00:35.0  21.7%  7.61;  Surgeon: Mittie Gaskin, MD;  Location: [facility];  Service: Ophthalmology;  Laterality: Right;  sleep apnea requests early   CATARACT EXTRACTION W/PHACO Left 06/11/2019   Procedure: CATARACT EXTRACTION PHACO AND INTRAOCULAR LENS PLACEMENT (IOC) LEFT PANOPTIX TORIC LENS  00:50.0  20.3%  10.21;  Surgeon: Mittie Gaskin, MD;  Location: [facility];  Service: Ophthalmology;  Laterality: Left;   sleep apnea requests early   COLONOSCOPY     last 2012 - [facility]   ESOPHAGOGASTRODUODENOSCOPY     Multiple, last 01/07/2017 with Savary dilation to 18 mm   EYE SURGERY  August 2022   Cataracs removed   FRACTURE SURGERY  2025   HEMORRHOID BANDING  2018   [facility]   INTRAMEDULLARY (IM) NAIL INTERTROCHANTERIC Right 09/06/2023   Procedure: INTRAMEDULLARY (IM) NAIL INTERTROCHANTERIC;  Surgeon: Celena Sharper, MD;  Location: [facility];  Service: Orthopedics;  Laterality: Right;   LUMBAR DISC SURGERY  1984   L5    Patient Active Problem List   Diagnosis Date Noted   Vitamin D  deficiency 01/29/2024   Closed right hip fracture (HCC) 09/05/2023   Current use of proton pump inhibitor 03/13/2023   Pain in joint, lower leg 01/30/2023   Migraine    Essential hypertension    H/O: CVA (cerebrovascular accident) 11/08/2020   Colon cancer screening 02/05/2018   Degenerative joint disease (DJD) of lumbar spine 05/02/2017   Internal hemorrhoids 05/02/2017   Generalized osteoarthritis of hand 12/11/2016   GAD (generalized anxiety disorder) 12/11/2016   Estrogen deficiency 05/11/2016   Encounter for screening mammogram for breast cancer 05/11/2016   OSA (obstructive sleep apnea) 03/06/2016   Left knee pain 10/18/2015   Snoring 10/18/2015   Routine general medical  examination at a health care facility 02/28/2015   Hyperlipidemia 08/27/2012   Osteopenia 12/17/2009   BACK PAIN, LUMBAR 07/02/2009   IRRITABLE BOWEL SYNDROME 04/02/2009   Asymptomatic postmenopausal status 12/10/2008   Hypothyroidism 12/05/2007   Meniere's disease 12/05/2007   GERD 12/05/2007    PCP: Dr. Laine Balls  REFERRING PROVIDER: Dr. Ozell Bruch  REFERRING DIAG: R hip fracture  THERAPY DIAG:  Abnormality of gait and mobility  Muscle weakness (generalized)  Other abnormalities of gait and mobility  Difficulty in walking, not elsewhere classified  Pain in right hip  Other lack of coordination  Rationale for  Evaluation and Treatment: Rehabilitation  ONSET DATE: 09/06/2023  SUBJECTIVE:   SUBJECTIVE STATEMENT: From Today: Patient reports doing well without any new complaints-  Reports 2/10 Right lateral hip pain.   From Eval:  Patient reports feeling better overall and states her MD wants her back in PT to work her out and strengthen her hip and work on balance. She denies any recent falls and reports her hip is feeling better since she was in PT back in June.   PERTINENT HISTORY: Sarah Harper is a 83 year old female with past medical history significant for HTN, hypothyroidism, GERD, osteopenia, dyslipidemia, OSA who presented to [facility] ED on 1/15 fall mechanical fall at home sustaining injury to her right hip. Patient underwent intramedullary nailing of the right hip 09/06/2023 by Dr. Bruch. Patient was receiving outpatient PT in May however was admitted back to hospital with Hyponatremia and acute metabolic encephalopathy. She has continued to be followed since release from hospital by Dr. Bruch for ongoing hip pain and received a bone stimulator/ultrasound device to help with non-healing hip and -Ultrasound/bone stimulator treatments 2 sets of 6 week treatment. Has competed 80/90 treatemnt and has f/u with Dr. Bruch on 10/29.  PAIN:  Are you having pain? Yes: NPRS scale: 2/10  Pain location: Right posterior superior gluteal Pain description: aggravating achy- constant Aggravating factors: Worse with ultrasound treatment Relieving factors: lying flat  PRECAUTIONS: Other: WBAT RLE and no restrictions per MD order  RED FLAGS: None   WEIGHT BEARING RESTRICTIONS: R LE WBAT  FALLS:  Has patient fallen in last 6 months? No  LIVING ENVIRONMENT: Lives with: lives with their spouse Lives in: House/apartment Stairs: Yes: Internal: 2 steps; on right going up Has following equipment at home: Vannie - 2 wheeled and Grab bars  OCCUPATION: Stage manager  PLOF: Independent  PATIENT GOALS:  Hope to be able to walk on my own without a device and be able to drive independently  NEXT MD VISIT: 06/18/2024- Dr. Bruch  OBJECTIVE:   Note: Objective measures were completed at Evaluation unless otherwise noted.  DIAGNOSTIC FINDINGS:  ADDENDUM REPORT: 03/05/2024 08:07   ADDENDUM: The original report was by Dr. Ryan Salvage. The following addendum is by Dr. Ryan Salvage:   With respect to further specifics on the intertrochanteric fracture, the original fracture as shown on the radiographs from 09/05/2023 does include a small comminuted avulsion of the lesser trochanter, with a component measuring 2.4 by 1.0 by 0.6 cm, shown for example on image 63 series 5 and image 102 series 2. It is conceivable that the lucency described as likely hyperemic could have been a donor site for this fragment, although the fragment is about 1.9 cm posterior to this lucency, and the natural action of the iliopsoas would tend to pull the fragment anteriorly and cephalad rather than posteriorly.   With respect to infection as a cause for  the lucency, I am skeptical given the lack of abnormal lucency extending along the hardware, although if the patient is experiencing fever and signs of infection then close monitoring would be recommended.     Electronically Signed   By: Ryan Salvage M.D.   On: 03/05/2024 08:07    Addended by Salvage Sawyers, MD on 03/05/2024  8:09 AM    Study Result  Narrative & Impression  CLINICAL DATA:  Right SI joint pain. Prior right hip intertrochanteric fracture ORIF in January 2025.   EXAM: CT PELVIS WITHOUT CONTRAST   TECHNIQUE: Multidetector CT imaging of the pelvis was performed following the standard protocol without intravenous contrast.   RADIATION DOSE REDUCTION: This exam was performed according to the departmental dose-optimization program which includes automated exposure control, adjustment of the mA and/or kV according to patient  size and/or use of iterative reconstruction technique.   COMPARISON:  Multiple exams, including radiographs 09/05/2023   FINDINGS: OSSEOUS STRUCTURES AND JOINTS:   Lower lumbar degenerative facet arthropathy. No SI joint diastasis. A small amount of nitrogen gas phenomenon in both SI joints without SI joint effusion observed. No erosions or other specific abnormalities along the SI joints aside from mild spurring.   No pelvic fracture observed.   Right femoral nail system in place with single distal interlocking screw and femoral head screw noted. This traverses the intertrochanteric fracture which is still visible, with notable lucency along part of the medial fracture margin on image 52 series 5 and image 99 series 2 probably from hyperemic resorption of bone in this local vicinity. There is potentially some early bridging of bone anteriorly at the fracture site. Early cortication along the posterior portion of the fracture may suggest early nonunion of the posterior portion for example as on image 62 series 5.   No abnormal lucency around the components of the nail system to indicate loosening or infection.   MUSCULOTENDINOUS:   Unremarkable   SACRAL PLEXUS:   No impinging lesion along the sacral plexus or proximal sciatic nerves.   OTHER:   Prominent stool throughout the colon favors constipation. Mild degenerative arthropathy of the left (contralateral) hip.   IMPRESSION: 1. No specific abnormality is identified to explain the patient's right SI joint pain. 2. Right femoral nail system in place traversing the intertrochanteric fracture. Questionable partial early nonunion of the posterior portion of the fracture anterior bridging is appreciated. There is some accentuated lucency along the medial portion of the fracture probably from hyperemic resorption of bone, but no abnormal extension of lucency along components of the IM nail or femoral head nail to suggest  loosening/infection. 3. Prominent stool throughout the colon favors constipation. 4. Mild degenerative arthropathy of the left (contralateral) hip. 5. Lower lumbar degenerative facet arthropathy.   Electronically Signed: By: Ryan Salvage M.D. On: 01/15/2024 16:54     PATIENT SURVEYS:  ABC scale: The Activities-Specific Balance Confidence (ABC) Scale 0% 10 20 30  40 50 60 70 80 90 100% No confidence<->completely confident  "How confident are you that you will not lose your balance or become unsteady when you . . .   Date tested 06/10/2024  Walk around the house 80%  2. Walk up or down stairs 20%  3. Bend over and pick up a slipper from in front of a closet floor 30%  4. Reach for a small can off a shelf at eye level 80%  5. Stand on tip toes and reach for something above your head 20%  6.  Stand on a chair and reach for something 0%  7. Sweep the floor 60%  8. Walk outside the house to a car parked in the driveway 50%  9. Get into or out of a car 50%  10. Walk across a parking lot to the mall 10%  11. Walk up or down a ramp 50%  12. Walk in a crowded mall where people rapidly walk past you 10%  13. Are bumped into by people as you walk through the mall 10%  14. Step onto or off of an escalator while you are holding onto the railing 20%  15. Step onto or off an escalator while holding onto parcels such that you cannot hold onto the railing 10%  16. Walk outside on icy sidewalks 0%  Total: #/16 26.25%     COGNITION: Overall cognitive status: Within functional limits for tasks assessed     SENSATION: WFL   POSTURE: rounded shoulders and forward head  PALPATION: (+) tenderness (lateral hip and posteior right off edge of R sided sacrum)   LOWER EXTREMITY ROM:  Active ROM Right eval Left eval  Hip flexion    Hip extension    Hip abduction    Hip adduction    Hip internal rotation    Hip external rotation    Knee flexion    Knee extension    Ankle  dorsiflexion    Ankle plantarflexion    Ankle inversion    Ankle eversion     (Blank rows = not tested)  LOWER EXTREMITY MMT:  MMT Right eval Left eval  Hip flexion 4 4+  Hip extension    Hip abduction 4 4+  Hip adduction    Hip internal rotation 4 4+  Hip external rotation 4 4+  Knee flexion 4 4  Knee extension 4 4  Ankle dorsiflexion 4+ 4+  Ankle plantarflexion    Ankle inversion    Ankle eversion     (Blank rows = not tested)  LOWER EXTREMITY SPECIAL TESTS:  None  FUNCTIONAL TESTS:  30 seconds chair stand test= Unable to stand without UE support today but did perform 10 reps with min UE support Timed up and go (TUG): 15.10 and 15.33=15.2 sec with RW 6 minute walk test: to be assessed next visit. 10 meter walk test: 12.10 and 11.69 sec =0.85 m/s avg with RW  Berg Balance Scale:  Item Test date: 06/10/2024 Date:  Date:   Sitting to standing 3. able to stand independently using hands Insert SmartPhrase OPRCBERGREEVAL Insert SmartPhrase OPRCBERGREEVAL  2. Standing unsupported 4. able to stand safely for 2 minutes    3. Sitting with back unsupported, feet supported 4. able to sit safely and securely for 2 minutes    4. Standing to sitting 3. controls descent by using hands    5. Pivot transfer  3. able to transfer safely with definite need of hands    6. Standing unsupported with eyes closed 4. able to stand 10 seconds safely    7. Standing unsupported with feet together 4. able to place feet together independently and stand 1 minute safely    8. Reaching forward with outstretched arms while standing 4. can reach forward confidently 25 cm (10 inches)    9. Pick up object from the floor from standing 3. able to pick up slipper but needs supervision    10. Turning to look behind over left and right shoulders while standing 2. turns sideways but only maintains balance  11. Turn 360 degrees 2. able to turn 360 degrees safely but slowly    12. Place alternate foot on step or  stool while standing unsupported 3. able to stand independently and complete 8 steps in > 20 seconds     13. Standing unsupported one foot in front 2. able to take small step independently and hold 30 seconds    14. Standing on one leg 1. tries to lift leg unable to hold 3 seconds but remains standing independently.      Total Score 42/56 Total Score:    Total Score:      GAIT: Distance walked: 150 feet Assistive device utilized: Walker - 2 wheeled Level of assistance: CGA Comments: Mild antalgia noted on right and decreased step length - short reciprocal steps with use of RW                                                                                                                                TREATMENT DATE: 07/14/24    Unless otherwise stated, CGA was provided and gait belt donned in order to ensure pt safety  NMR:  The patient completed a Balance Baseline Test using the [facility] platform to assess static and dynamic postural control. This standardized test measures the patient's ability to maintain balance under various conditions by recording weight shifts, sway, and stability. The results provide objective data on balance deficits, asymmetries, and areas for targeted intervention. The patient tolerated the assessment well and required CGA assistance during the test. Findings will guide treatment planning and monitor progress over time. Testing performed on 6.0 stability level for several minutes.   Testing including static stand with eyes open x 30 sec; eyes closed x 30 sec, and pursuit x 30 sec. Patient then practiced static standing with improving score for 4 additional trials- Score ranging from 775- 990    TA:   Sit to stand without UE support- 12 reps (holding 5Kg orange ball) and then 2nd set x 12 reps without any UE support.  Step tap onto 1/2 foam roll- 4# AW - alt lE step forward/ Each LE lateral/ then alt LE backward x 10 steps in each Position- all w/o UE  support  Fwd lunge (to be able to reach for items on low shelf or floor)  2 sets of 5 reps. 4# AW  Rear lunge at support bar with 1 UE support 2 x 5 reps ea LE 4#   FWD/BWD walking along support bar area (approx 6 feet) - 4# AW - focusing on step length Standing hip abd- circles - CW (RTB around knees) - swinging leg around yoga block (vertical) 2 x 10 ea LE  Fwd step ups x 12 alt LE without any UE support  Gait training:  Review of walking with SPC on left side- Patient able to accept initial VC and walk > 300 feet with SPC without any significant limp with short reciprocal gait  pattern.  PATIENT EDUCATION:  Education details: rationale for 6 min walk and instruction in HEP Person educated: Patient and Spouse Education method: Explanation Education comprehension: verbalized understanding  HOME EXERCISE PROGRAM: Access Code: [email or web address] URL: [email or web address] Date: 06/16/2024 Prepared by: Reyes London  Exercises - Single Leg Stance  - 3 x weekly - 5-10 sets - up to 20 sec hold - Standing Hip Flexion March  - 3 x weekly - 3 sets - 10 reps - 2 sec hold - Tandem Stance  - 3 x weekly - 5 sets - 30 sec  hold - Side Stepping with Resistance at Ankles  - 3 x weekly - 3 sets - 10 reps - Sit to Stand with Arms Crossed  - 3 x weekly - 3 sets - 10 reps  ASSESSMENT:  CLINICAL IMPRESSION:  Added more gait activities per plan of care and patient responded very well. She was able to walk without any significant limp today with use of cane. No pain reported and no LOB. She continues to progress with improving fwd/retro steps and gaining more confidence in her balance/mobility.  Pt will continue to benefit from skilled physical therapy intervention to address impairments, improve QOL, and attain therapy goals.    OBJECTIVE IMPAIRMENTS: Abnormal gait, cardiopulmonary status limiting activity, decreased activity tolerance, decreased balance, decreased coordination, decreased  endurance, decreased mobility, difficulty walking, decreased strength, and pain.   ACTIVITY LIMITATIONS: carrying, lifting, bending, sitting, standing, squatting, stairs, and transfers  PARTICIPATION LIMITATIONS: meal prep, cleaning, laundry, driving, shopping, community activity, and yard work  PERSONAL FACTORS: 1-2 comorbidities: HTN, Arthritis are also affecting patient's functional outcome.   REHAB POTENTIAL: Good  CLINICAL DECISION MAKING: Stable/uncomplicated  EVALUATION COMPLEXITY: Low   GOALS: Goals reviewed with patient? Yes  SHORT TERM GOALS: Target date: 07/22/2024 Pt will be independent with HEP in order to improve strength and balance in order to decrease fall risk and improve function at home. Baseline: EVAL- Patient reports has not been performing any former HEP since her hospitalization in June Goal status: INITIAL LONG TERM GOALS: Target date: 09/02/2024  Patient will report < 4/10 Right hip pain at worst with all functional activities.  Baseline: EVAL= worst = 7/10 Goal status: INITIAL  2.  Patient will complete >12 reps without use of UE support in 30 second sit to stand test indicating an increased LE strength and improved balance. Baseline: EVAL: Unable to stand without UE support today but did perform 10 reps with min UE support Goal status: INITIAL  3.  Patient will improve ABC score by 15 points to demonstrate statistically significant improvement in mobility and quality of life as it relates to their confidence in their balance.  Baseline: EVAL: 26.25 Goal status: INITIAL   4.  Patient will increase Berg Balance score by > 6 points to demonstrate decreased fall risk during functional activities. Baseline: EVAL= 42/56 Goal status: INITIAL   5.   Patient will reduce timed up and go to <11 seconds to reduce fall risk and demonstrate improved transfer/gait ability. Baseline: EVAL= 15.2 sec with RW Goal status: INITIAL  6.   Patient will increase 10  meter walk test to >1.72m/s as to improve gait speed for better community ambulation and to reduce fall risk. Baseline: EVAL= 0.85 m/s with RW Goal status: INITIAL  7.   Patient will increase six minute walk test distance to >1000 for progression to community ambulator and improve gait ability Baseline: Eval= To be completed at visit #2; 06/16/2024= 805 feet with  RW Goal status: INITIAL    PLAN:  PT FREQUENCY: 1-2x/week  PT DURATION: 12 weeks  PLANNED INTERVENTIONS: [id number]- PT Re-evaluation, 97750- Physical Performance Testing, 97110-Therapeutic exercises, 97530- Therapeutic activity, [id number]- Neuromuscular re-education, 97535- Self Care, [id number]- Manual therapy, [id number]- Gait training, [phone]- Orthotic Initial, [phone]- Orthotic/Prosthetic subsequent, [phone]- Canalith repositioning, [phone]- Electrical stimulation (manual), [phone] (1-2 muscles), [phone] (3+ muscles)- Dry Needling, Patient/Family education, Balance training, Stair training, Taping, Joint mobilization, Joint manipulation, Spinal mobilization, Vestibular training, DME instructions, Cryotherapy, and Moist heat  PLAN FOR NEXT SESSION:  R hip strengthening Gait training with cane Balance training Add to HEP as appropriate.    Reyes LOISE London, PT 07/15/2024, 8:35 AM

## 2024-07-14 ENCOUNTER — Ambulatory Visit

## 2024-07-14 DIAGNOSIS — R269 Unspecified abnormalities of gait and mobility: Secondary | ICD-10-CM | POA: Diagnosis not present

## 2024-07-14 DIAGNOSIS — R262 Difficulty in walking, not elsewhere classified: Secondary | ICD-10-CM

## 2024-07-14 DIAGNOSIS — M6281 Muscle weakness (generalized): Secondary | ICD-10-CM

## 2024-07-14 DIAGNOSIS — R278 Other lack of coordination: Secondary | ICD-10-CM

## 2024-07-14 DIAGNOSIS — R2689 Other abnormalities of gait and mobility: Secondary | ICD-10-CM

## 2024-07-14 DIAGNOSIS — M25551 Pain in right hip: Secondary | ICD-10-CM

## 2024-07-16 ENCOUNTER — Ambulatory Visit

## 2024-07-16 DIAGNOSIS — M6281 Muscle weakness (generalized): Secondary | ICD-10-CM

## 2024-07-16 DIAGNOSIS — R2689 Other abnormalities of gait and mobility: Secondary | ICD-10-CM

## 2024-07-16 DIAGNOSIS — R278 Other lack of coordination: Secondary | ICD-10-CM

## 2024-07-16 DIAGNOSIS — M25551 Pain in right hip: Secondary | ICD-10-CM

## 2024-07-16 DIAGNOSIS — R262 Difficulty in walking, not elsewhere classified: Secondary | ICD-10-CM

## 2024-07-16 DIAGNOSIS — R269 Unspecified abnormalities of gait and mobility: Secondary | ICD-10-CM

## 2024-07-16 NOTE — Therapy (Signed)
 OUTPATIENT PHYSICAL THERAPY LOWER EXTREMITY TREATMENT/Physical Therapy Progress Note   Dates of reporting period  06/10/2024   to   07/16/2024    Patient Name: Sarah Harper MRN: [id number] DOB:07-Jul-1941, 83 y.o., female Today's Date: 07/16/2024  END OF SESSION:  PT End of Session - 07/16/24 1025     Visit Number 10    Number of Visits 24    Date for Recertification  09/03/23    Progress Note Due on Visit 20    PT Start Time 1018    PT Stop Time 1050    PT Time Calculation (min) 32 min    Equipment Utilized During Treatment Gait belt    Activity Tolerance Patient tolerated treatment well    Behavior During Therapy [facility] for tasks assessed/performed             Past Medical History:  Diagnosis Date   Anxiety 2022   Arthritis    Blood loss anemia 09/13/2023   Cataract 1997   Colon cancer screening 02/05/2018   DDD (degenerative disc disease)    chronic back pain   ETD (eustachian tube dysfunction)    GERD (gastroesophageal reflux disease)    Hypertension 2022   IBS (irritable bowel syndrome)    Meniere's disease    Migraine    still gets visual aura from time to time   Mild cognitive impairment    Osteoporosis    Sleep apnea 2017   CPAP   Stroke (HCC) 2022   Syncopal episodes    after work-up - possible seizures?   Thyroid  disease 1990   hypothyroid   TIA (transient ischemic attack) 11/09/2020   Past Surgical History:  Procedure Laterality Date   APPENDECTOMY     CATARACT EXTRACTION W/PHACO Right 05/21/2019   Procedure: CATARACT EXTRACTION PHACO AND INTRAOCULAR LENS PLACEMENT (IOC) RIGHT panoptix lens  00:35.0  21.7%  7.61;  Surgeon: Mittie Gaskin, MD;  Location: [facility];  Service: Ophthalmology;  Laterality: Right;  sleep apnea requests early   CATARACT EXTRACTION W/PHACO Left 06/11/2019   Procedure: CATARACT EXTRACTION PHACO AND INTRAOCULAR LENS PLACEMENT (IOC) LEFT PANOPTIX TORIC LENS  00:50.0  20.3%  10.21;  Surgeon: Mittie Gaskin, MD;  Location: [facility];  Service: Ophthalmology;  Laterality: Left;  sleep apnea requests early   COLONOSCOPY     last 2012 - [facility]   ESOPHAGOGASTRODUODENOSCOPY     Multiple, last 01/07/2017 with Savary dilation to 18 mm   EYE SURGERY  August 2022   Cataracs removed   FRACTURE SURGERY  2025   HEMORRHOID BANDING  2018   [facility]   INTRAMEDULLARY (IM) NAIL INTERTROCHANTERIC Right 09/06/2023   Procedure: INTRAMEDULLARY (IM) NAIL INTERTROCHANTERIC;  Surgeon: Celena Sharper, MD;  Location: [facility];  Service: Orthopedics;  Laterality: Right;   LUMBAR DISC SURGERY  1984   L5    Patient Active Problem List   Diagnosis Date Noted   Vitamin D  deficiency 01/29/2024   Closed right hip fracture (HCC) 09/05/2023   Current use of proton pump inhibitor 03/13/2023   Pain in joint, lower leg 01/30/2023   Migraine    Essential hypertension    H/O: CVA (cerebrovascular accident) 11/08/2020   Colon cancer screening 02/05/2018   Degenerative joint disease (DJD) of lumbar spine 05/02/2017   Internal hemorrhoids 05/02/2017   Generalized osteoarthritis of hand 12/11/2016   GAD (generalized anxiety disorder) 12/11/2016   Estrogen deficiency 05/11/2016   Encounter for screening mammogram for breast cancer 05/11/2016   OSA (obstructive  sleep apnea) 03/06/2016   Left knee pain 10/18/2015   Snoring 10/18/2015   Routine general medical examination at a health care facility 02/28/2015   Hyperlipidemia 08/27/2012   Osteopenia 12/17/2009   BACK PAIN, LUMBAR 07/02/2009   IRRITABLE BOWEL SYNDROME 04/02/2009   Asymptomatic postmenopausal status 12/10/2008   Hypothyroidism 12/05/2007   Meniere's disease 12/05/2007   GERD 12/05/2007    PCP: Dr. Laine Balls  REFERRING PROVIDER: Dr. Ozell Bruch  REFERRING DIAG: R hip fracture  THERAPY DIAG:  Abnormality of gait and mobility  Muscle weakness (generalized)  Other abnormalities of gait and mobility  Difficulty in walking, not  elsewhere classified  Pain in right hip  Other lack of coordination  Rationale for Evaluation and Treatment: Rehabilitation  ONSET DATE: 09/06/2023  SUBJECTIVE:   SUBJECTIVE STATEMENT: From Today: Patient reports not feeling great today and states she was really doing okay until she was on her way to therapy today.    From Eval:  Patient reports feeling better overall and states her MD wants her back in PT to work her out and strengthen her hip and work on balance. She denies any recent falls and reports her hip is feeling better since she was in PT back in June.   PERTINENT HISTORY: Sarah Harper is a 83 year old female with past medical history significant for HTN, hypothyroidism, GERD, osteopenia, dyslipidemia, OSA who presented to [facility] ED on 1/15 fall mechanical fall at home sustaining injury to her right hip. Patient underwent intramedullary nailing of the right hip 09/06/2023 by Dr. Bruch. Patient was receiving outpatient PT in May however was admitted back to hospital with Hyponatremia and acute metabolic encephalopathy. She has continued to be followed since release from hospital by Dr. Bruch for ongoing hip pain and received a bone stimulator/ultrasound device to help with non-healing hip and -Ultrasound/bone stimulator treatments 2 sets of 6 week treatment. Has competed 80/90 treatemnt and has f/u with Dr. Bruch on 10/29.  PAIN:  Are you having pain? Yes: NPRS scale: 2/10  Pain location: Right posterior superior gluteal Pain description: aggravating achy- constant Aggravating factors: Worse with ultrasound treatment Relieving factors: lying flat  PRECAUTIONS: Other: WBAT RLE and no restrictions per MD order  RED FLAGS: None   WEIGHT BEARING RESTRICTIONS: R LE WBAT  FALLS:  Has patient fallen in last 6 months? No  LIVING ENVIRONMENT: Lives with: lives with their spouse Lives in: House/apartment Stairs: Yes: Internal: 2 steps; on right going up Has following equipment  at home: Vannie - 2 wheeled and Grab bars  OCCUPATION: Stage manager  PLOF: Independent  PATIENT GOALS: Hope to be able to walk on my own without a device and be able to drive independently  NEXT MD VISIT: 06/18/2024- Dr. Bruch  OBJECTIVE:   Note: Objective measures were completed at Evaluation unless otherwise noted.  DIAGNOSTIC FINDINGS:  ADDENDUM REPORT: 03/05/2024 08:07   ADDENDUM: The original report was by Dr. Ryan Salvage. The following addendum is by Dr. Ryan Salvage:   With respect to further specifics on the intertrochanteric fracture, the original fracture as shown on the radiographs from 09/05/2023 does include a small comminuted avulsion of the lesser trochanter, with a component measuring 2.4 by 1.0 by 0.6 cm, shown for example on image 63 series 5 and image 102 series 2. It is conceivable that the lucency described as likely hyperemic could have been a donor site for this fragment, although the fragment is about 1.9 cm posterior to this lucency, and the natural  action of the iliopsoas would tend to pull the fragment anteriorly and cephalad rather than posteriorly.   With respect to infection as a cause for the lucency, I am skeptical given the lack of abnormal lucency extending along the hardware, although if the patient is experiencing fever and signs of infection then close monitoring would be recommended.     Electronically Signed   By: Ryan Salvage M.D.   On: 03/05/2024 08:07    Addended by Salvage Sawyers, MD on 03/05/2024  8:09 AM    Study Result  Narrative & Impression  CLINICAL DATA:  Right SI joint pain. Prior right hip intertrochanteric fracture ORIF in January 2025.   EXAM: CT PELVIS WITHOUT CONTRAST   TECHNIQUE: Multidetector CT imaging of the pelvis was performed following the standard protocol without intravenous contrast.   RADIATION DOSE REDUCTION: This exam was performed according to the departmental  dose-optimization program which includes automated exposure control, adjustment of the mA and/or kV according to patient size and/or use of iterative reconstruction technique.   COMPARISON:  Multiple exams, including radiographs 09/05/2023   FINDINGS: OSSEOUS STRUCTURES AND JOINTS:   Lower lumbar degenerative facet arthropathy. No SI joint diastasis. A small amount of nitrogen gas phenomenon in both SI joints without SI joint effusion observed. No erosions or other specific abnormalities along the SI joints aside from mild spurring.   No pelvic fracture observed.   Right femoral nail system in place with single distal interlocking screw and femoral head screw noted. This traverses the intertrochanteric fracture which is still visible, with notable lucency along part of the medial fracture margin on image 52 series 5 and image 99 series 2 probably from hyperemic resorption of bone in this local vicinity. There is potentially some early bridging of bone anteriorly at the fracture site. Early cortication along the posterior portion of the fracture may suggest early nonunion of the posterior portion for example as on image 62 series 5.   No abnormal lucency around the components of the nail system to indicate loosening or infection.   MUSCULOTENDINOUS:   Unremarkable   SACRAL PLEXUS:   No impinging lesion along the sacral plexus or proximal sciatic nerves.   OTHER:   Prominent stool throughout the colon favors constipation. Mild degenerative arthropathy of the left (contralateral) hip.   IMPRESSION: 1. No specific abnormality is identified to explain the patient's right SI joint pain. 2. Right femoral nail system in place traversing the intertrochanteric fracture. Questionable partial early nonunion of the posterior portion of the fracture anterior bridging is appreciated. There is some accentuated lucency along the medial portion of the fracture probably from hyperemic  resorption of bone, but no abnormal extension of lucency along components of the IM nail or femoral head nail to suggest loosening/infection. 3. Prominent stool throughout the colon favors constipation. 4. Mild degenerative arthropathy of the left (contralateral) hip. 5. Lower lumbar degenerative facet arthropathy.   Electronically Signed: By: Ryan Salvage M.D. On: 01/15/2024 16:54     PATIENT SURVEYS:  ABC scale: The Activities-Specific Balance Confidence (ABC) Scale 0% 10 20 30  40 50 60 70 80 90 100% No confidence<->completely confident  "How confident are you that you will not lose your balance or become unsteady when you . . .   Date tested 06/10/2024  Walk around the house 80%  2. Walk up or down stairs 20%  3. Bend over and pick up a slipper from in front of a closet floor 30%  4. Reach for  a small can off a shelf at eye level 80%  5. Stand on tip toes and reach for something above your head 20%  6. Stand on a chair and reach for something 0%  7. Sweep the floor 60%  8. Walk outside the house to a car parked in the driveway 50%  9. Get into or out of a car 50%  10. Walk across a parking lot to the mall 10%  11. Walk up or down a ramp 50%  12. Walk in a crowded mall where people rapidly walk past you 10%  13. Are bumped into by people as you walk through the mall 10%  14. Step onto or off of an escalator while you are holding onto the railing 20%  15. Step onto or off an escalator while holding onto parcels such that you cannot hold onto the railing 10%  16. Walk outside on icy sidewalks 0%  Total: #/16 26.25%     COGNITION: Overall cognitive status: Within functional limits for tasks assessed     SENSATION: WFL   POSTURE: rounded shoulders and forward head  PALPATION: (+) tenderness (lateral hip and posteior right off edge of R sided sacrum)   LOWER EXTREMITY ROM:  Active ROM Right eval Left eval  Hip flexion    Hip extension    Hip abduction     Hip adduction    Hip internal rotation    Hip external rotation    Knee flexion    Knee extension    Ankle dorsiflexion    Ankle plantarflexion    Ankle inversion    Ankle eversion     (Blank rows = not tested)  LOWER EXTREMITY MMT:  MMT Right eval Left eval  Hip flexion 4 4+  Hip extension    Hip abduction 4 4+  Hip adduction    Hip internal rotation 4 4+  Hip external rotation 4 4+  Knee flexion 4 4  Knee extension 4 4  Ankle dorsiflexion 4+ 4+  Ankle plantarflexion    Ankle inversion    Ankle eversion     (Blank rows = not tested)  LOWER EXTREMITY SPECIAL TESTS:  None  FUNCTIONAL TESTS:  30 seconds chair stand test= Unable to stand without UE support today but did perform 10 reps with min UE support Timed up and go (TUG): 15.10 and 15.33=15.2 sec with RW 6 minute walk test: to be assessed next visit. 10 meter walk test: 12.10 and 11.69 sec =0.85 m/s avg with RW  Berg Balance Scale:  Item Test date: 06/10/2024 Date:  Date:   Sitting to standing 3. able to stand independently using hands Insert SmartPhrase OPRCBERGREEVAL Insert SmartPhrase OPRCBERGREEVAL  2. Standing unsupported 4. able to stand safely for 2 minutes    3. Sitting with back unsupported, feet supported 4. able to sit safely and securely for 2 minutes    4. Standing to sitting 3. controls descent by using hands    5. Pivot transfer  3. able to transfer safely with definite need of hands    6. Standing unsupported with eyes closed 4. able to stand 10 seconds safely    7. Standing unsupported with feet together 4. able to place feet together independently and stand 1 minute safely    8. Reaching forward with outstretched arms while standing 4. can reach forward confidently 25 cm (10 inches)    9. Pick up object from the floor from standing 3. able to pick up slipper but  needs supervision    10. Turning to look behind over left and right shoulders while standing 2. turns sideways but only maintains  balance    11. Turn 360 degrees 2. able to turn 360 degrees safely but slowly    12. Place alternate foot on step or stool while standing unsupported 3. able to stand independently and complete 8 steps in > 20 seconds     13. Standing unsupported one foot in front 2. able to take small step independently and hold 30 seconds    14. Standing on one leg 1. tries to lift leg unable to hold 3 seconds but remains standing independently.      Total Score 42/56 Total Score:    Total Score:      GAIT: Distance walked: 150 feet Assistive device utilized: Walker - 2 wheeled Level of assistance: CGA Comments: Mild antalgia noted on right and decreased step length - short reciprocal steps with use of RW                                                                                                                                TREATMENT DATE: 07/16/24    Unless otherwise stated, CGA was provided and gait belt donned in order to ensure pt safety   120/64 mHg (sitting) at rest LUE  108/66 mmHG (standing) -Feeling off- not dizzy.  124/72 mmHg (Standing for 4 min) - still not feeling great.   Physical therapy treatment session today consisted of completing assessment of goals and administration of testing as demonstrated and documented in flow sheet, treatment, and goals section of this note. Addition treatments may be found below.   Treatment limited -deferred majority of testing today due to patient not feeling well with near orthostatic values.   THEREX:   Longsit SLR hip swing up/over 1/2 spike ball x 8 (increased pain R hip flexor pain  Seated hip swing up/over 1/2 spike ball 2 x 10 reps ea LE  Seated ham curl RTB 2 x 10 reps   Seated Hip IR with RTB with knee ball squeeze x 12 reps  Seated resistive Hip ER x 15 reps  Seated LAQ 3# (slow eccentric hold) x 15 reps ea LE          PATIENT EDUCATION:  Education details: rationale for 6 min walk and instruction in HEP Person  educated: Patient and Spouse Education method: Explanation Education comprehension: verbalized understanding  HOME EXERCISE PROGRAM: Access Code: [email or web address] URL: [email or web address] Date: 06/16/2024 Prepared by: Reyes London  Exercises - Single Leg Stance  - 3 x weekly - 5-10 sets - up to 20 sec hold - Standing Hip Flexion March  - 3 x weekly - 3 sets - 10 reps - 2 sec hold - Tandem Stance  - 3 x weekly - 5 sets - 30 sec  hold - Side Stepping with Resistance at Ankles  - 3 x weekly - 3 sets -  10 reps - Sit to Stand with Arms Crossed  - 3 x weekly - 3 sets - 10 reps  ASSESSMENT:  CLINICAL IMPRESSION: Patient presents with good spirits for Progress report visit but states not feeling well. Deferred testing today secondary to not feeling well and will reassess mobility goals next visit. She is reporting decreased overall pain  since initiating services and has progressed to walking with cane. She was able to participate with seated activities with only soreness reported in upper anterior thigh region. Will reassess all functional goals next visit if feeling better. Overall describing less pain at home. Patient's condition has the potential to improve in response to therapy. Maximum improvement is yet to be obtained. The anticipated improvement is attainable and reasonable in a generally predictable time.  Pt will continue to benefit from skilled physical therapy intervention to address impairments, improve QOL, and attain therapy goals.    OBJECTIVE IMPAIRMENTS: Abnormal gait, cardiopulmonary status limiting activity, decreased activity tolerance, decreased balance, decreased coordination, decreased endurance, decreased mobility, difficulty walking, decreased strength, and pain.   ACTIVITY LIMITATIONS: carrying, lifting, bending, sitting, standing, squatting, stairs, and transfers  PARTICIPATION LIMITATIONS: meal prep, cleaning, laundry, driving, shopping, community  activity, and yard work  PERSONAL FACTORS: 1-2 comorbidities: HTN, Arthritis are also affecting patient's functional outcome.   REHAB POTENTIAL: Good  CLINICAL DECISION MAKING: Stable/uncomplicated  EVALUATION COMPLEXITY: Low   GOALS: Goals reviewed with patient? Yes  SHORT TERM GOALS: Target date: 07/22/2024 Pt will be independent with HEP in order to improve strength and balance in order to decrease fall risk and improve function at home. Baseline: EVAL- Patient reports has not been performing any former HEP since her hospitalization in June; 07/16/2024= HEP are going pretty well, no pain and compliant as best I can.  Goal status: MET   LONG TERM GOALS: Target date: 09/02/2024  Patient will report < 4/10 Right hip pain at worst with all functional activities.  Baseline: EVAL= worst = 7/10; 07/16/2024= 0/10 current but up to 6/10 at worst Goal status: PROGRESS  2.  Patient will complete >12 reps without use of UE support in 30 second sit to stand test indicating an increased LE strength and improved balance. Baseline: EVAL: Unable to stand without UE support today but did perform 10 reps with min UE support Goal status: INITIAL  3.  Patient will improve ABC score by 15 points to demonstrate statistically significant improvement in mobility and quality of life as it relates to their confidence in their balance.  Baseline: EVAL: 26.25 Goal status: INITIAL   4.  Patient will increase Berg Balance score by > 6 points to demonstrate decreased fall risk during functional activities. Baseline: EVAL= 42/56 Goal status: INITIAL   5.   Patient will reduce timed up and go to <11 seconds to reduce fall risk and demonstrate improved transfer/gait ability. Baseline: EVAL= 15.2 sec with RW Goal status: INITIAL  6.   Patient will increase 10 meter walk test to >1.60m/s as to improve gait speed for better community ambulation and to reduce fall risk. Baseline: EVAL= 0.85 m/s with RW Goal  status: INITIAL  7.   Patient will increase six minute walk test distance to >1000 for progression to community ambulator and improve gait ability Baseline: Eval= To be completed at visit #2; 06/16/2024= 805 feet with RW Goal status: INITIAL    PLAN:  PT FREQUENCY: 1-2x/week  PT DURATION: 12 weeks  PLANNED INTERVENTIONS: [id number]- PT Re-evaluation, 97750- Physical Performance Testing, 97110-Therapeutic exercises,  02469- Therapeutic activity, [id number]- Neuromuscular re-education, [id number]- Self Care, [id number]- Manual therapy, [id number]- Gait training, [id number]- Orthotic Initial, [id number]- Orthotic/Prosthetic subsequent, [phone]- Canalith repositioning, [id number]- Electrical stimulation (manual), [phone] (1-2 muscles), [id number] (3+ muscles)- Dry Needling, Patient/Family education, Balance training, Stair training, Taping, Joint mobilization, Joint manipulation, Spinal mobilization, Vestibular training, DME instructions, Cryotherapy, and Moist heat  PLAN FOR NEXT SESSION:  R hip strengthening Gait training with cane Balance training Add to HEP as appropriate.    Reyes LOISE London, PT 07/16/2024, 12:26 PM

## 2024-07-20 NOTE — Therapy (Signed)
 OUTPATIENT PHYSICAL THERAPY LOWER EXTREMITY TREATMENT   Patient Name: Sarah Harper MRN: [id number] DOB:1941-02-01, 83 y.o., female Today's Date: 07/21/2024  END OF SESSION:  PT End of Session - 07/21/24 1009     Visit Number 11    Number of Visits 24    Date for Recertification  09/03/23    Progress Note Due on Visit 20    PT Start Time 1013    PT Stop Time 1058    PT Time Calculation (min) 45 min    Equipment Utilized During Treatment Gait belt    Activity Tolerance Patient tolerated treatment well    Behavior During Therapy WFL for tasks assessed/performed              Past Medical History:  Diagnosis Date   Anxiety 2022   Arthritis    Blood loss anemia 09/13/2023   Cataract 1997   Colon cancer screening 02/05/2018   DDD (degenerative disc disease)    chronic back pain   ETD (eustachian tube dysfunction)    GERD (gastroesophageal reflux disease)    Hypertension 2022   IBS (irritable bowel syndrome)    Meniere's disease    Migraine    still gets visual aura from time to time   Mild cognitive impairment    Osteoporosis    Sleep apnea 2017   CPAP   Stroke (HCC) 2022   Syncopal episodes    after work-up - possible seizures?   Thyroid  disease 1990   hypothyroid   TIA (transient ischemic attack) 11/09/2020   Past Surgical History:  Procedure Laterality Date   APPENDECTOMY     CATARACT EXTRACTION W/PHACO Right 05/21/2019   Procedure: CATARACT EXTRACTION PHACO AND INTRAOCULAR LENS PLACEMENT (IOC) RIGHT panoptix lens  00:35.0  21.7%  7.61;  Surgeon: Mittie Gaskin, MD;  Location: [facility];  Service: Ophthalmology;  Laterality: Right;  sleep apnea requests early   CATARACT EXTRACTION W/PHACO Left 06/11/2019   Procedure: CATARACT EXTRACTION PHACO AND INTRAOCULAR LENS PLACEMENT (IOC) LEFT PANOPTIX TORIC LENS  00:50.0  20.3%  10.21;  Surgeon: Mittie Gaskin, MD;  Location: [facility];  Service: Ophthalmology;  Laterality: Left;   sleep apnea requests early   COLONOSCOPY     last 2012 - [facility]   ESOPHAGOGASTRODUODENOSCOPY     Multiple, last 01/07/2017 with Savary dilation to 18 mm   EYE SURGERY  August 2022   Cataracs removed   FRACTURE SURGERY  2025   HEMORRHOID BANDING  2018   [facility]   INTRAMEDULLARY (IM) NAIL INTERTROCHANTERIC Right 09/06/2023   Procedure: INTRAMEDULLARY (IM) NAIL INTERTROCHANTERIC;  Surgeon: Celena Sharper, MD;  Location: [facility];  Service: Orthopedics;  Laterality: Right;   LUMBAR DISC SURGERY  1984   L5    Patient Active Problem List   Diagnosis Date Noted   Vitamin D  deficiency 01/29/2024   Closed right hip fracture (HCC) 09/05/2023   Current use of proton pump inhibitor 03/13/2023   Pain in joint, lower leg 01/30/2023   Migraine    Essential hypertension    H/O: CVA (cerebrovascular accident) 11/08/2020   Colon cancer screening 02/05/2018   Degenerative joint disease (DJD) of lumbar spine 05/02/2017   Internal hemorrhoids 05/02/2017   Generalized osteoarthritis of hand 12/11/2016   GAD (generalized anxiety disorder) 12/11/2016   Estrogen deficiency 05/11/2016   Encounter for screening mammogram for breast cancer 05/11/2016   OSA (obstructive sleep apnea) 03/06/2016   Left knee pain 10/18/2015   Snoring 10/18/2015   Routine general  medical examination at a health care facility 02/28/2015   Hyperlipidemia 08/27/2012   Osteopenia 12/17/2009   BACK PAIN, LUMBAR 07/02/2009   IRRITABLE BOWEL SYNDROME 04/02/2009   Asymptomatic postmenopausal status 12/10/2008   Hypothyroidism 12/05/2007   Meniere's disease 12/05/2007   GERD 12/05/2007    PCP: Dr. Laine Balls  REFERRING PROVIDER: Dr. Ozell Bruch  REFERRING DIAG: R hip fracture  THERAPY DIAG:  Abnormality of gait and mobility  Muscle weakness (generalized)  Difficulty in walking, not elsewhere classified  Other abnormalities of gait and mobility  Pain in right hip  Other lack of coordination  Rationale for  Evaluation and Treatment: Rehabilitation  ONSET DATE: 09/06/2023  SUBJECTIVE:   SUBJECTIVE STATEMENT: From Today: Patient reports doing well overall. Denies any recent falls.     From Eval:  Patient reports feeling better overall and states her MD wants her back in PT to work her out and strengthen her hip and work on balance. She denies any recent falls and reports her hip is feeling better since she was in PT back in June.   PERTINENT HISTORY: Sarah Harper is a 83 year old female with past medical history significant for HTN, hypothyroidism, GERD, osteopenia, dyslipidemia, OSA who presented to [facility] ED on 1/15 fall mechanical fall at home sustaining injury to her right hip. Patient underwent intramedullary nailing of the right hip 09/06/2023 by Dr. Bruch. Patient was receiving outpatient PT in May however was admitted back to hospital with Hyponatremia and acute metabolic encephalopathy. She has continued to be followed since release from hospital by Dr. Bruch for ongoing hip pain and received a bone stimulator/ultrasound device to help with non-healing hip and -Ultrasound/bone stimulator treatments 2 sets of 6 week treatment. Has competed 80/90 treatemnt and has f/u with Dr. Bruch on 10/29.  PAIN:  Are you having pain? Yes: NPRS scale: 2/10  Pain location: Right posterior superior gluteal Pain description: aggravating achy- constant Aggravating factors: Worse with ultrasound treatment Relieving factors: lying flat  PRECAUTIONS: Other: WBAT RLE and no restrictions per MD order  RED FLAGS: None   WEIGHT BEARING RESTRICTIONS: R LE WBAT  FALLS:  Has patient fallen in last 6 months? No  LIVING ENVIRONMENT: Lives with: lives with their spouse Lives in: House/apartment Stairs: Yes: Internal: 2 steps; on right going up Has following equipment at home: Vannie - 2 wheeled and Grab bars  OCCUPATION: Stage manager  PLOF: Independent  PATIENT GOALS: Hope to be able to walk on  my own without a device and be able to drive independently  NEXT MD VISIT: 06/18/2024- Dr. Bruch  OBJECTIVE:   Note: Objective measures were completed at Evaluation unless otherwise noted.  DIAGNOSTIC FINDINGS:  ADDENDUM REPORT: 03/05/2024 08:07   ADDENDUM: The original report was by Dr. Ryan Salvage. The following addendum is by Dr. Ryan Salvage:   With respect to further specifics on the intertrochanteric fracture, the original fracture as shown on the radiographs from 09/05/2023 does include a small comminuted avulsion of the lesser trochanter, with a component measuring 2.4 by 1.0 by 0.6 cm, shown for example on image 63 series 5 and image 102 series 2. It is conceivable that the lucency described as likely hyperemic could have been a donor site for this fragment, although the fragment is about 1.9 cm posterior to this lucency, and the natural action of the iliopsoas would tend to pull the fragment anteriorly and cephalad rather than posteriorly.   With respect to infection as a cause for the lucency, I  am skeptical given the lack of abnormal lucency extending along the hardware, although if the patient is experiencing fever and signs of infection then close monitoring would be recommended.     Electronically Signed   By: Ryan Salvage M.D.   On: 03/05/2024 08:07    Addended by Salvage Sawyers, MD on 03/05/2024  8:09 AM    Study Result  Narrative & Impression  CLINICAL DATA:  Right SI joint pain. Prior right hip intertrochanteric fracture ORIF in January 2025.   EXAM: CT PELVIS WITHOUT CONTRAST   TECHNIQUE: Multidetector CT imaging of the pelvis was performed following the standard protocol without intravenous contrast.   RADIATION DOSE REDUCTION: This exam was performed according to the departmental dose-optimization program which includes automated exposure control, adjustment of the mA and/or kV according to patient size and/or use of  iterative reconstruction technique.   COMPARISON:  Multiple exams, including radiographs 09/05/2023   FINDINGS: OSSEOUS STRUCTURES AND JOINTS:   Lower lumbar degenerative facet arthropathy. No SI joint diastasis. A small amount of nitrogen gas phenomenon in both SI joints without SI joint effusion observed. No erosions or other specific abnormalities along the SI joints aside from mild spurring.   No pelvic fracture observed.   Right femoral nail system in place with single distal interlocking screw and femoral head screw noted. This traverses the intertrochanteric fracture which is still visible, with notable lucency along part of the medial fracture margin on image 52 series 5 and image 99 series 2 probably from hyperemic resorption of bone in this local vicinity. There is potentially some early bridging of bone anteriorly at the fracture site. Early cortication along the posterior portion of the fracture may suggest early nonunion of the posterior portion for example as on image 62 series 5.   No abnormal lucency around the components of the nail system to indicate loosening or infection.   MUSCULOTENDINOUS:   Unremarkable   SACRAL PLEXUS:   No impinging lesion along the sacral plexus or proximal sciatic nerves.   OTHER:   Prominent stool throughout the colon favors constipation. Mild degenerative arthropathy of the left (contralateral) hip.   IMPRESSION: 1. No specific abnormality is identified to explain the patient's right SI joint pain. 2. Right femoral nail system in place traversing the intertrochanteric fracture. Questionable partial early nonunion of the posterior portion of the fracture anterior bridging is appreciated. There is some accentuated lucency along the medial portion of the fracture probably from hyperemic resorption of bone, but no abnormal extension of lucency along components of the IM nail or femoral head nail to suggest  loosening/infection. 3. Prominent stool throughout the colon favors constipation. 4. Mild degenerative arthropathy of the left (contralateral) hip. 5. Lower lumbar degenerative facet arthropathy.   Electronically Signed: By: Ryan Salvage M.D. On: 01/15/2024 16:54     PATIENT SURVEYS:  ABC scale: The Activities-Specific Balance Confidence (ABC) Scale 0% 10 20 30  40 50 60 70 80 90 100% No confidence<->completely confident  "How confident are you that you will not lose your balance or become unsteady when you . . .   Date tested 06/10/2024  Walk around the house 80%  2. Walk up or down stairs 20%  3. Bend over and pick up a slipper from in front of a closet floor 30%  4. Reach for a small can off a shelf at eye level 80%  5. Stand on tip toes and reach for something above your head 20%  6. Stand on a  chair and reach for something 0%  7. Sweep the floor 60%  8. Walk outside the house to a car parked in the driveway 50%  9. Get into or out of a car 50%  10. Walk across a parking lot to the mall 10%  11. Walk up or down a ramp 50%  12. Walk in a crowded mall where people rapidly walk past you 10%  13. Are bumped into by people as you walk through the mall 10%  14. Step onto or off of an escalator while you are holding onto the railing 20%  15. Step onto or off an escalator while holding onto parcels such that you cannot hold onto the railing 10%  16. Walk outside on icy sidewalks 0%  Total: #/16 26.25%     COGNITION: Overall cognitive status: Within functional limits for tasks assessed     SENSATION: WFL   POSTURE: rounded shoulders and forward head  PALPATION: (+) tenderness (lateral hip and posteior right off edge of R sided sacrum)   LOWER EXTREMITY ROM:  Active ROM Right eval Left eval  Hip flexion    Hip extension    Hip abduction    Hip adduction    Hip internal rotation    Hip external rotation    Knee flexion    Knee extension    Ankle  dorsiflexion    Ankle plantarflexion    Ankle inversion    Ankle eversion     (Blank rows = not tested)  LOWER EXTREMITY MMT:  MMT Right eval Left eval  Hip flexion 4 4+  Hip extension    Hip abduction 4 4+  Hip adduction    Hip internal rotation 4 4+  Hip external rotation 4 4+  Knee flexion 4 4  Knee extension 4 4  Ankle dorsiflexion 4+ 4+  Ankle plantarflexion    Ankle inversion    Ankle eversion     (Blank rows = not tested)  LOWER EXTREMITY SPECIAL TESTS:  None  FUNCTIONAL TESTS:  30 seconds chair stand test= Unable to stand without UE support today but did perform 10 reps with min UE support Timed up and go (TUG): 15.10 and 15.33=15.2 sec with RW 6 minute walk test: to be assessed next visit. 10 meter walk test: 12.10 and 11.69 sec =0.85 m/s avg with RW  Berg Balance Scale:  Item Test date: 06/10/2024 Date:  Date:   Sitting to standing 3. able to stand independently using hands Insert SmartPhrase OPRCBERGREEVAL Insert SmartPhrase OPRCBERGREEVAL  2. Standing unsupported 4. able to stand safely for 2 minutes    3. Sitting with back unsupported, feet supported 4. able to sit safely and securely for 2 minutes    4. Standing to sitting 3. controls descent by using hands    5. Pivot transfer  3. able to transfer safely with definite need of hands    6. Standing unsupported with eyes closed 4. able to stand 10 seconds safely    7. Standing unsupported with feet together 4. able to place feet together independently and stand 1 minute safely    8. Reaching forward with outstretched arms while standing 4. can reach forward confidently 25 cm (10 inches)    9. Pick up object from the floor from standing 3. able to pick up slipper but needs supervision    10. Turning to look behind over left and right shoulders while standing 2. turns sideways but only maintains balance    11. Turn  360 degrees 2. able to turn 360 degrees safely but slowly    12. Place alternate foot on step or  stool while standing unsupported 3. able to stand independently and complete 8 steps in > 20 seconds     13. Standing unsupported one foot in front 2. able to take small step independently and hold 30 seconds    14. Standing on one leg 1. tries to lift leg unable to hold 3 seconds but remains standing independently.      Total Score 42/56 Total Score:    Total Score:      GAIT: Distance walked: 150 feet Assistive device utilized: Walker - 2 wheeled Level of assistance: CGA Comments: Mild antalgia noted on right and decreased step length - short reciprocal steps with use of RW                                                                                                                                TREATMENT DATE: 07/21/24    Unless otherwise stated, CGA was provided and gait belt donned in order to ensure pt safety  Therapeutic Activities:  -Mini lunge walk in // bars - down and back x 4 -Side step up/over 4 cones in // bars- Min BUE Support - down and back x 5 - Fwd step up 3# AW x 15 reps alt LE -Eccentric step down 3# AW (step down with LLE) x 10 -Dynamic hip circles 3# AW around blue foam roll (vertically positioned) x 15 reps each LE    NMR:  -Dynamic hip march - down and back x 4 -Resistive gait with SPC and 3#AW x 480 feet - focusing on walking through fatigue-  - 2nd round of resistive gait with SPC- focusing on symmetry with steps and less weight through cane.  - Static stand on airex pad - staggered feet - with eyes open x 30 sec each side x 2. -Standing narrowed feet on airex pad with dynamic ball toss against mirrored wall- x 20 throws.              PATIENT EDUCATION:  Education details: rationale for 6 min walk and instruction in HEP Person educated: Patient and Spouse Education method: Explanation Education comprehension: verbalized understanding  HOME EXERCISE PROGRAM: Access Code: [email or web address] URL: [email or web address] Date:  06/16/2024 Prepared by: Reyes London  Exercises - Single Leg Stance  - 3 x weekly - 5-10 sets - up to 20 sec hold - Standing Hip Flexion March  - 3 x weekly - 3 sets - 10 reps - 2 sec hold - Tandem Stance  - 3 x weekly - 5 sets - 30 sec  hold - Side Stepping with Resistance at Ankles  - 3 x weekly - 3 sets - 10 reps - Sit to Stand with Arms Crossed  - 3 x weekly - 3 sets - 10 reps  ASSESSMENT:  CLINICAL IMPRESSION: Patient able to resume standing activities  with no sign of dizziness or report of undo pain. She was responsive to [facility] for improved gait step length today with cane activity.   Pt will continue to benefit from skilled physical therapy intervention to address impairments, improve QOL, and attain therapy goals.    OBJECTIVE IMPAIRMENTS: Abnormal gait, cardiopulmonary status limiting activity, decreased activity tolerance, decreased balance, decreased coordination, decreased endurance, decreased mobility, difficulty walking, decreased strength, and pain.   ACTIVITY LIMITATIONS: carrying, lifting, bending, sitting, standing, squatting, stairs, and transfers  PARTICIPATION LIMITATIONS: meal prep, cleaning, laundry, driving, shopping, community activity, and yard work  PERSONAL FACTORS: 1-2 comorbidities: HTN, Arthritis are also affecting patient's functional outcome.   REHAB POTENTIAL: Good  CLINICAL DECISION MAKING: Stable/uncomplicated  EVALUATION COMPLEXITY: Low   GOALS: Goals reviewed with patient? Yes  SHORT TERM GOALS: Target date: 07/22/2024 Pt will be independent with HEP in order to improve strength and balance in order to decrease fall risk and improve function at home. Baseline: EVAL- Patient reports has not been performing any former HEP since her hospitalization in June; 07/16/2024= HEP are going pretty well, no pain and compliant as best I can.  Goal status: MET   LONG TERM GOALS: Target date: 09/02/2024  Patient will report < 4/10 Right hip pain at  worst with all functional activities.  Baseline: EVAL= worst = 7/10; 07/16/2024= 0/10 current but up to 6/10 at worst Goal status: PROGRESS  2.  Patient will complete >12 reps without use of UE support in 30 second sit to stand test indicating an increased LE strength and improved balance. Baseline: EVAL: Unable to stand without UE support today but did perform 10 reps with min UE support Goal status: INITIAL  3.  Patient will improve ABC score by 15 points to demonstrate statistically significant improvement in mobility and quality of life as it relates to their confidence in their balance.  Baseline: EVAL: 26.25 Goal status: INITIAL   4.  Patient will increase Berg Balance score by > 6 points to demonstrate decreased fall risk during functional activities. Baseline: EVAL= 42/56 Goal status: INITIAL   5.   Patient will reduce timed up and go to <11 seconds to reduce fall risk and demonstrate improved transfer/gait ability. Baseline: EVAL= 15.2 sec with RW Goal status: INITIAL  6.   Patient will increase 10 meter walk test to >1.54m/s as to improve gait speed for better community ambulation and to reduce fall risk. Baseline: EVAL= 0.85 m/s with RW Goal status: INITIAL  7.   Patient will increase six minute walk test distance to >1000 for progression to community ambulator and improve gait ability Baseline: Eval= To be completed at visit #2; 06/16/2024= 805 feet with RW Goal status: INITIAL    PLAN:  PT FREQUENCY: 1-2x/week  PT DURATION: 12 weeks  PLANNED INTERVENTIONS: [id number]- PT Re-evaluation, 97750- Physical Performance Testing, 97110-Therapeutic exercises, 97530- Therapeutic activity, [id number]- Neuromuscular re-education, 97535- Self Care, [id number]- Manual therapy, [id number]- Gait training, [id number]- Orthotic Initial, [id number]- Orthotic/Prosthetic subsequent, [phone]- Canalith repositioning, [id number]- Electrical stimulation (manual), [phone] (1-2 muscles), [id number] (3+ muscles)- Dry Needling,  Patient/Family education, Balance training, Stair training, Taping, Joint mobilization, Joint manipulation, Spinal mobilization, Vestibular training, DME instructions, Cryotherapy, and Moist heat  PLAN FOR NEXT SESSION:  R hip strengthening Gait training with cane Balance training Add to HEP as appropriate.    Reyes LOISE London, PT 07/21/2024, 11:29 AM

## 2024-07-21 ENCOUNTER — Ambulatory Visit

## 2024-07-21 DIAGNOSIS — R2689 Other abnormalities of gait and mobility: Secondary | ICD-10-CM | POA: Diagnosis present

## 2024-07-21 DIAGNOSIS — M25551 Pain in right hip: Secondary | ICD-10-CM | POA: Diagnosis present

## 2024-07-21 DIAGNOSIS — R262 Difficulty in walking, not elsewhere classified: Secondary | ICD-10-CM | POA: Insufficient documentation

## 2024-07-21 DIAGNOSIS — M6281 Muscle weakness (generalized): Secondary | ICD-10-CM | POA: Diagnosis present

## 2024-07-21 DIAGNOSIS — R278 Other lack of coordination: Secondary | ICD-10-CM | POA: Insufficient documentation

## 2024-07-21 DIAGNOSIS — R269 Unspecified abnormalities of gait and mobility: Secondary | ICD-10-CM | POA: Insufficient documentation

## 2024-07-23 ENCOUNTER — Ambulatory Visit

## 2024-07-23 DIAGNOSIS — R2689 Other abnormalities of gait and mobility: Secondary | ICD-10-CM

## 2024-07-23 DIAGNOSIS — R278 Other lack of coordination: Secondary | ICD-10-CM

## 2024-07-23 DIAGNOSIS — M25551 Pain in right hip: Secondary | ICD-10-CM

## 2024-07-23 DIAGNOSIS — R269 Unspecified abnormalities of gait and mobility: Secondary | ICD-10-CM

## 2024-07-23 DIAGNOSIS — R262 Difficulty in walking, not elsewhere classified: Secondary | ICD-10-CM

## 2024-07-23 DIAGNOSIS — M6281 Muscle weakness (generalized): Secondary | ICD-10-CM

## 2024-07-23 NOTE — Therapy (Signed)
 OUTPATIENT PHYSICAL THERAPY LOWER EXTREMITY TREATMENT   Patient Name: Sarah Harper MRN: [id number] DOB:[birth year], 83 y.o., female Today's Date: 07/23/2024  END OF SESSION:  PT End of Session - 07/23/24 1029     Visit Number 12    Number of Visits 24    Date for Recertification  09/03/23    Progress Note Due on Visit 20    PT Start Time 1017    PT Stop Time 1058    PT Time Calculation (min) 41 min    Equipment Utilized During Treatment Gait belt    Activity Tolerance Patient tolerated treatment well    Behavior During Therapy WFL for tasks assessed/performed              Past Medical History:  Diagnosis Date   Anxiety 2022   Arthritis    Blood loss anemia 09/13/2023   Cataract 1997   Colon cancer screening 02/05/2018   DDD (degenerative disc disease)    chronic back pain   ETD (eustachian tube dysfunction)    GERD (gastroesophageal reflux disease)    Hypertension 2022   IBS (irritable bowel syndrome)    Meniere's disease    Migraine    still gets visual aura from time to time   Mild cognitive impairment    Osteoporosis    Sleep apnea 2017   CPAP   Stroke (HCC) 2022   Syncopal episodes    after work-up - possible seizures?   Thyroid  disease 1990   hypothyroid   TIA (transient ischemic attack) 11/09/2020   Past Surgical History:  Procedure Laterality Date   APPENDECTOMY     CATARACT EXTRACTION W/PHACO Right 05/21/2019   Procedure: CATARACT EXTRACTION PHACO AND INTRAOCULAR LENS PLACEMENT (IOC) RIGHT panoptix lens  00:35.0  21.7%  7.61;  Surgeon: Sarah Gaskin, MD;  Location: [facility];  Service: Ophthalmology;  Laterality: Right;  sleep apnea requests early   CATARACT EXTRACTION W/PHACO Left 06/11/2019   Procedure: CATARACT EXTRACTION PHACO AND INTRAOCULAR LENS PLACEMENT (IOC) LEFT PANOPTIX TORIC LENS  00:50.0  20.3%  10.21;  Surgeon: Sarah Gaskin, MD;  Location: [facility];  Service: Ophthalmology;  Laterality: Left;   sleep apnea requests early   COLONOSCOPY     last 2012 - [facility]   ESOPHAGOGASTRODUODENOSCOPY     Multiple, last 01/07/2017 with Savary dilation to 18 mm   EYE SURGERY  August 2022   Cataracs removed   FRACTURE SURGERY  2025   HEMORRHOID BANDING  2018   [facility]   INTRAMEDULLARY (IM) NAIL INTERTROCHANTERIC Right 09/06/2023   Procedure: INTRAMEDULLARY (IM) NAIL INTERTROCHANTERIC;  Surgeon: Sarah Sharper, MD;  Location: [facility];  Service: Orthopedics;  Laterality: Right;   LUMBAR DISC SURGERY  1984   L5    Patient Active Problem List   Diagnosis Date Noted   Vitamin D  deficiency 01/29/2024   Closed right hip fracture (HCC) 09/05/2023   Current use of proton pump inhibitor 03/13/2023   Pain in joint, lower leg 01/30/2023   Migraine    Essential hypertension    H/O: CVA (cerebrovascular accident) 11/08/2020   Colon cancer screening 02/05/2018   Degenerative joint disease (DJD) of lumbar spine 05/02/2017   Internal hemorrhoids 05/02/2017   Generalized osteoarthritis of hand 12/11/2016   GAD (generalized anxiety disorder) 12/11/2016   Estrogen deficiency 05/11/2016   Encounter for screening mammogram for breast cancer 05/11/2016   OSA (obstructive sleep apnea) 03/06/2016   Left knee pain 10/18/2015   Snoring 10/18/2015   Routine general  medical examination at a health care facility 02/28/2015   Hyperlipidemia 08/27/2012   Osteopenia 12/17/2009   BACK PAIN, LUMBAR 07/02/2009   IRRITABLE BOWEL SYNDROME 04/02/2009   Asymptomatic postmenopausal status 12/10/2008   Hypothyroidism 12/05/2007   Meniere's disease 12/05/2007   GERD 12/05/2007    PCP: Dr. Laine Harper  REFERRING PROVIDER: Dr. Ozell Harper  REFERRING DIAG: R hip fracture  THERAPY DIAG:  Abnormality of gait and mobility  Muscle weakness (generalized)  Difficulty in walking, not elsewhere classified  Other abnormalities of gait and mobility  Pain in right hip  Other lack of coordination  Rationale for  Evaluation and Treatment: Rehabilitation  ONSET DATE: 09/06/2023  SUBJECTIVE:   SUBJECTIVE STATEMENT: From Today: Patient reports doing well overall. Denies any recent falls.     From Eval:  Patient reports feeling better overall and states her MD wants her back in PT to work her out and strengthen her hip and work on balance. She denies any recent falls and reports her hip is feeling better since she was in PT back in June.   PERTINENT HISTORY: Sarah Harper is a 83 year old female with past medical history significant for HTN, hypothyroidism, GERD, osteopenia, dyslipidemia, OSA who presented to [facility] ED on 1/15 fall mechanical fall at home sustaining injury to her right hip. Patient underwent intramedullary nailing of the right hip 09/06/2023 by Dr. Bruch. Patient was receiving outpatient PT in May however was admitted back to hospital with Hyponatremia and acute metabolic encephalopathy. She has continued to be followed since release from hospital by Dr. Bruch for ongoing hip pain and received a bone stimulator/ultrasound device to help with non-healing hip and -Ultrasound/bone stimulator treatments 2 sets of 6 week treatment. Has competed 80/90 treatemnt and has f/u with Dr. Bruch on 10/29.  PAIN:  Are you having pain? Yes: NPRS scale: 2/10  Pain location: Right posterior superior gluteal Pain description: aggravating achy- constant Aggravating factors: Worse with ultrasound treatment Relieving factors: lying flat  PRECAUTIONS: Other: WBAT RLE and no restrictions per MD order  RED FLAGS: None   WEIGHT BEARING RESTRICTIONS: R LE WBAT  FALLS:  Has patient fallen in last 6 months? No  LIVING ENVIRONMENT: Lives with: lives with their spouse Lives in: House/apartment Stairs: Yes: Internal: 2 steps; on right going up Has following equipment at home: Vannie - 2 wheeled and Grab bars  OCCUPATION: Stage manager  PLOF: Independent  PATIENT GOALS: Hope to be able to walk on  my own without a device and be able to drive independently  NEXT MD VISIT: 06/18/2024- Dr. Bruch  OBJECTIVE:   Note: Objective measures were completed at Evaluation unless otherwise noted.  DIAGNOSTIC FINDINGS:  ADDENDUM REPORT: 03/05/2024 08:07   ADDENDUM: The original report was by Dr. Ryan Salvage. The following addendum is by Dr. Ryan Salvage:   With respect to further specifics on the intertrochanteric fracture, the original fracture as shown on the radiographs from 09/05/2023 does include a small comminuted avulsion of the lesser trochanter, with a component measuring 2.4 by 1.0 by 0.6 cm, shown for example on image 63 series 5 and image 102 series 2. It is conceivable that the lucency described as likely hyperemic could have been a donor site for this fragment, although the fragment is about 1.9 cm posterior to this lucency, and the natural action of the iliopsoas would tend to pull the fragment anteriorly and cephalad rather than posteriorly.   With respect to infection as a cause for the lucency, I  am skeptical given the lack of abnormal lucency extending along the hardware, although if the patient is experiencing fever and signs of infection then close monitoring would be recommended.     Electronically Signed   By: Ryan Salvage M.D.   On: 03/05/2024 08:07    Addended by Salvage Sawyers, MD on 03/05/2024  8:09 AM    Study Result  Narrative & Impression  CLINICAL DATA:  Right SI joint pain. Prior right hip intertrochanteric fracture ORIF in January 2025.   EXAM: CT PELVIS WITHOUT CONTRAST   TECHNIQUE: Multidetector CT imaging of the pelvis was performed following the standard protocol without intravenous contrast.   RADIATION DOSE REDUCTION: This exam was performed according to the departmental dose-optimization program which includes automated exposure control, adjustment of the mA and/or kV according to patient size and/or use of  iterative reconstruction technique.   COMPARISON:  Multiple exams, including radiographs 09/05/2023   FINDINGS: OSSEOUS STRUCTURES AND JOINTS:   Lower lumbar degenerative facet arthropathy. No SI joint diastasis. A small amount of nitrogen gas phenomenon in both SI joints without SI joint effusion observed. No erosions or other specific abnormalities along the SI joints aside from mild spurring.   No pelvic fracture observed.   Right femoral nail system in place with single distal interlocking screw and femoral head screw noted. This traverses the intertrochanteric fracture which is still visible, with notable lucency along part of the medial fracture margin on image 52 series 5 and image 99 series 2 probably from hyperemic resorption of bone in this local vicinity. There is potentially some early bridging of bone anteriorly at the fracture site. Early cortication along the posterior portion of the fracture may suggest early nonunion of the posterior portion for example as on image 62 series 5.   No abnormal lucency around the components of the nail system to indicate loosening or infection.   MUSCULOTENDINOUS:   Unremarkable   SACRAL PLEXUS:   No impinging lesion along the sacral plexus or proximal sciatic nerves.   OTHER:   Prominent stool throughout the colon favors constipation. Mild degenerative arthropathy of the left (contralateral) hip.   IMPRESSION: 1. No specific abnormality is identified to explain the patient's right SI joint pain. 2. Right femoral nail system in place traversing the intertrochanteric fracture. Questionable partial early nonunion of the posterior portion of the fracture anterior bridging is appreciated. There is some accentuated lucency along the medial portion of the fracture probably from hyperemic resorption of bone, but no abnormal extension of lucency along components of the IM nail or femoral head nail to suggest  loosening/infection. 3. Prominent stool throughout the colon favors constipation. 4. Mild degenerative arthropathy of the left (contralateral) hip. 5. Lower lumbar degenerative facet arthropathy.   Electronically Signed: By: Ryan Salvage M.D. On: 01/15/2024 16:54     PATIENT SURVEYS:  ABC scale: The Activities-Specific Balance Confidence (ABC) Scale 0% 10 20 30  40 50 60 70 80 90 100% No confidence<->completely confident  "How confident are you that you will not lose your balance or become unsteady when you . . .   Date tested 06/10/2024  Walk around the house 80%  2. Walk up or down stairs 20%  3. Bend over and pick up a slipper from in front of a closet floor 30%  4. Reach for a small can off a shelf at eye level 80%  5. Stand on tip toes and reach for something above your head 20%  6. Stand on a  chair and reach for something 0%  7. Sweep the floor 60%  8. Walk outside the house to a car parked in the driveway 50%  9. Get into or out of a car 50%  10. Walk across a parking lot to the mall 10%  11. Walk up or down a ramp 50%  12. Walk in a crowded mall where people rapidly walk past you 10%  13. Are bumped into by people as you walk through the mall 10%  14. Step onto or off of an escalator while you are holding onto the railing 20%  15. Step onto or off an escalator while holding onto parcels such that you cannot hold onto the railing 10%  16. Walk outside on icy sidewalks 0%  Total: #/16 26.25%     COGNITION: Overall cognitive status: Within functional limits for tasks assessed     SENSATION: WFL   POSTURE: rounded shoulders and forward head  PALPATION: (+) tenderness (lateral hip and posteior right off edge of R sided sacrum)   LOWER EXTREMITY ROM:  Active ROM Right eval Left eval  Hip flexion    Hip extension    Hip abduction    Hip adduction    Hip internal rotation    Hip external rotation    Knee flexion    Knee extension    Ankle  dorsiflexion    Ankle plantarflexion    Ankle inversion    Ankle eversion     (Blank rows = not tested)  LOWER EXTREMITY MMT:  MMT Right eval Left eval  Hip flexion 4 4+  Hip extension    Hip abduction 4 4+  Hip adduction    Hip internal rotation 4 4+  Hip external rotation 4 4+  Knee flexion 4 4  Knee extension 4 4  Ankle dorsiflexion 4+ 4+  Ankle plantarflexion    Ankle inversion    Ankle eversion     (Blank rows = not tested)  LOWER EXTREMITY SPECIAL TESTS:  None  FUNCTIONAL TESTS:  30 seconds chair stand test= Unable to stand without UE support today but did perform 10 reps with min UE support Timed up and go (TUG): 15.10 and 15.33=15.2 sec with RW 6 minute walk test: to be assessed next visit. 10 meter walk test: 12.10 and 11.69 sec =0.85 m/s avg with RW  Berg Balance Scale:  Item Test date: 06/10/2024 Date:  Date:   Sitting to standing 3. able to stand independently using hands Insert SmartPhrase OPRCBERGREEVAL Insert SmartPhrase OPRCBERGREEVAL  2. Standing unsupported 4. able to stand safely for 2 minutes    3. Sitting with back unsupported, feet supported 4. able to sit safely and securely for 2 minutes    4. Standing to sitting 3. controls descent by using hands    5. Pivot transfer  3. able to transfer safely with definite need of hands    6. Standing unsupported with eyes closed 4. able to stand 10 seconds safely    7. Standing unsupported with feet together 4. able to place feet together independently and stand 1 minute safely    8. Reaching forward with outstretched arms while standing 4. can reach forward confidently 25 cm (10 inches)    9. Pick up object from the floor from standing 3. able to pick up slipper but needs supervision    10. Turning to look behind over left and right shoulders while standing 2. turns sideways but only maintains balance    11. Turn  360 degrees 2. able to turn 360 degrees safely but slowly    12. Place alternate foot on step or  stool while standing unsupported 3. able to stand independently and complete 8 steps in > 20 seconds     13. Standing unsupported one foot in front 2. able to take small step independently and hold 30 seconds    14. Standing on one leg 1. tries to lift leg unable to hold 3 seconds but remains standing independently.      Total Score 42/56 Total Score:    Total Score:      GAIT: Distance walked: 150 feet Assistive device utilized: Walker - 2 wheeled Level of assistance: CGA Comments: Mild antalgia noted on right and decreased step length - short reciprocal steps with use of RW                                                                                                                                TREATMENT DATE: 07/23/24    Unless otherwise stated, CGA was provided and gait belt donned in order to ensure pt safety   THEREX: Seated hip march 3# AW 2 x 10  Seated Hip IR 3# (squeeze into foam roll +swing feet out wide) 2 x 10  Seated ham curl BTB 2 x 10 reps  Seated LAQ 2 x 10  Standing Donkey kick 3# AW each LE   Therapeutic Activities:  -Sit to Stand without UE support- 15 reps -Resistive gait with SPC - 3 # x 350 feet - good reciprocal step with no LOB or signs of unsteadiness.  - Fwd step up 3# AW x 15 reps alt LE -Dynamic hip circles 3# AW around blue foam roll (vertically positioned) x 15 reps each LE    NMR:  -Dynamic hip march - down and back x 4 -Resistive gait with SPC and 3#AW x 480 feet - focusing on walking through fatigue-  - 2nd round of resistive gait with SPC- focusing on symmetry with steps and less weight through cane.  - Static stand on airex pad - staggered feet - with eyes open x 30 sec each side x 2. -Standing narrowed feet on airex pad with dynamic ball toss against mirrored wall- x 20 throws.              PATIENT EDUCATION:  Education details: rationale for 6 min walk and instruction in HEP Person educated: Patient and Spouse Education  method: Explanation Education comprehension: verbalized understanding  HOME EXERCISE PROGRAM: Access Code: [email or web address] URL: [email or web address] Date: 06/16/2024 Prepared by: Reyes London  Exercises - Single Leg Stance  - 3 x weekly - 5-10 sets - up to 20 sec hold - Standing Hip Flexion March  - 3 x weekly - 3 sets - 10 reps - 2 sec hold - Tandem Stance  - 3 x weekly - 5 sets - 30 sec  hold - Side Stepping with Resistance at  Ankles  - 3 x weekly - 3 sets - 10 reps - Sit to Stand with Arms Crossed  - 3 x weekly - 3 sets - 10 reps  ASSESSMENT:  CLINICAL IMPRESSION: Patient continues to progress as seen by good balance and symmetry with gait. She continues to improve with weighted therex/functional activities without report of pain. No difficulty with walking with SPC today.   Pt will continue to benefit from skilled physical therapy intervention to address impairments, improve QOL, and attain therapy goals.    OBJECTIVE IMPAIRMENTS: Abnormal gait, cardiopulmonary status limiting activity, decreased activity tolerance, decreased balance, decreased coordination, decreased endurance, decreased mobility, difficulty walking, decreased strength, and pain.   ACTIVITY LIMITATIONS: carrying, lifting, bending, sitting, standing, squatting, stairs, and transfers  PARTICIPATION LIMITATIONS: meal prep, cleaning, laundry, driving, shopping, community activity, and yard work  PERSONAL FACTORS: 1-2 comorbidities: HTN, Arthritis are also affecting patient's functional outcome.   REHAB POTENTIAL: Good  CLINICAL DECISION MAKING: Stable/uncomplicated  EVALUATION COMPLEXITY: Low   GOALS: Goals reviewed with patient? Yes  SHORT TERM GOALS: Target date: 07/22/2024 Pt will be independent with HEP in order to improve strength and balance in order to decrease fall risk and improve function at home. Baseline: EVAL- Patient reports has not been performing any former HEP since her  hospitalization in June; 07/16/2024= HEP are going pretty well, no pain and compliant as best I can.  Goal status: MET   LONG TERM GOALS: Target date: 09/02/2024  Patient will report < 4/10 Right hip pain at worst with all functional activities.  Baseline: EVAL= worst = 7/10; 07/16/2024= 0/10 current but up to 6/10 at worst Goal status: PROGRESS  2.  Patient will complete >12 reps without use of UE support in 30 second sit to stand test indicating an increased LE strength and improved balance. Baseline: EVAL: Unable to stand without UE support today but did perform 10 reps with min UE support Goal status: INITIAL  3.  Patient will improve ABC score by 15 points to demonstrate statistically significant improvement in mobility and quality of life as it relates to their confidence in their balance.  Baseline: EVAL: 26.25 Goal status: INITIAL   4.  Patient will increase Berg Balance score by > 6 points to demonstrate decreased fall risk during functional activities. Baseline: EVAL= 42/56 Goal status: INITIAL   5.   Patient will reduce timed up and go to <11 seconds to reduce fall risk and demonstrate improved transfer/gait ability. Baseline: EVAL= 15.2 sec with RW Goal status: INITIAL  6.   Patient will increase 10 meter walk test to >1.50m/s as to improve gait speed for better community ambulation and to reduce fall risk. Baseline: EVAL= 0.85 m/s with RW Goal status: INITIAL  7.   Patient will increase six minute walk test distance to >1000 for progression to community ambulator and improve gait ability Baseline: Eval= To be completed at visit #2; 06/16/2024= 805 feet with RW Goal status: INITIAL    PLAN:  PT FREQUENCY: 1-2x/week  PT DURATION: 12 weeks  PLANNED INTERVENTIONS: [id number]- PT Re-evaluation, 97750- Physical Performance Testing, 97110-Therapeutic exercises, 97530- Therapeutic activity, [id number]- Neuromuscular re-education, 97535- Self Care, [id number]- Manual therapy, [id number]-  Gait training, [id number]- Orthotic Initial, [id number]- Orthotic/Prosthetic subsequent, [phone]- Canalith repositioning, [id number]- Electrical stimulation (manual), [phone] (1-2 muscles), [id number] (3+ muscles)- Dry Needling, Patient/Family education, Balance training, Stair training, Taping, Joint mobilization, Joint manipulation, Spinal mobilization, Vestibular training, DME instructions, Cryotherapy, and Moist heat  PLAN FOR NEXT SESSION:  R hip strengthening  Gait training with cane Balance training Add to HEP as appropriate.    Reyes LOISE London, PT 07/23/2024, 11:38 AM

## 2024-07-28 ENCOUNTER — Ambulatory Visit

## 2024-07-28 DIAGNOSIS — M25551 Pain in right hip: Secondary | ICD-10-CM

## 2024-07-28 DIAGNOSIS — R278 Other lack of coordination: Secondary | ICD-10-CM

## 2024-07-28 DIAGNOSIS — R2689 Other abnormalities of gait and mobility: Secondary | ICD-10-CM

## 2024-07-28 DIAGNOSIS — M6281 Muscle weakness (generalized): Secondary | ICD-10-CM

## 2024-07-28 DIAGNOSIS — R262 Difficulty in walking, not elsewhere classified: Secondary | ICD-10-CM

## 2024-07-28 DIAGNOSIS — R269 Unspecified abnormalities of gait and mobility: Secondary | ICD-10-CM | POA: Diagnosis not present

## 2024-07-28 NOTE — Therapy (Signed)
 OUTPATIENT PHYSICAL THERAPY LOWER EXTREMITY TREATMENT   Patient Name: Sarah Harper MRN: [id number] DOB:[birth year], 83 y.o., female Today's Date: 07/28/2024  END OF SESSION:  PT End of Session - 07/28/24 1015     Visit Number 13    Number of Visits 24    Date for Recertification  09/03/23    Progress Note Due on Visit 20    PT Start Time 1016    PT Stop Time 1059    PT Time Calculation (min) 43 min    Equipment Utilized During Treatment Gait belt    Activity Tolerance Patient tolerated treatment well    Behavior During Therapy WFL for tasks assessed/performed              Past Medical History:  Diagnosis Date   Anxiety 2022   Arthritis    Blood loss anemia 09/13/2023   Cataract 1997   Colon cancer screening 02/05/2018   DDD (degenerative disc disease)    chronic back pain   ETD (eustachian tube dysfunction)    GERD (gastroesophageal reflux disease)    Hypertension 2022   IBS (irritable bowel syndrome)    Meniere's disease    Migraine    still gets visual aura from time to time   Mild cognitive impairment    Osteoporosis    Sleep apnea 2017   CPAP   Stroke (HCC) 2022   Syncopal episodes    after work-up - possible seizures?   Thyroid  disease 1990   hypothyroid   TIA (transient ischemic attack) 11/09/2020   Past Surgical History:  Procedure Laterality Date   APPENDECTOMY     CATARACT EXTRACTION W/PHACO Right 05/21/2019   Procedure: CATARACT EXTRACTION PHACO AND INTRAOCULAR LENS PLACEMENT (IOC) RIGHT panoptix lens  00:35.0  21.7%  7.61;  Surgeon: Mittie Gaskin, MD;  Location: [facility];  Service: Ophthalmology;  Laterality: Right;  sleep apnea requests early   CATARACT EXTRACTION W/PHACO Left 06/11/2019   Procedure: CATARACT EXTRACTION PHACO AND INTRAOCULAR LENS PLACEMENT (IOC) LEFT PANOPTIX TORIC LENS  00:50.0  20.3%  10.21;  Surgeon: Mittie Gaskin, MD;  Location: [facility];  Service: Ophthalmology;  Laterality: Left;   sleep apnea requests early   COLONOSCOPY     last 2012 - [facility]   ESOPHAGOGASTRODUODENOSCOPY     Multiple, last 01/07/2017 with Savary dilation to 18 mm   EYE SURGERY  August 2022   Cataracs removed   FRACTURE SURGERY  2025   HEMORRHOID BANDING  2018   [facility]   INTRAMEDULLARY (IM) NAIL INTERTROCHANTERIC Right 09/06/2023   Procedure: INTRAMEDULLARY (IM) NAIL INTERTROCHANTERIC;  Surgeon: Celena Sharper, MD;  Location: [facility];  Service: Orthopedics;  Laterality: Right;   LUMBAR DISC SURGERY  1984   L5    Patient Active Problem List   Diagnosis Date Noted   Vitamin D  deficiency 01/29/2024   Closed right hip fracture (HCC) 09/05/2023   Current use of proton pump inhibitor 03/13/2023   Pain in joint, lower leg 01/30/2023   Migraine    Essential hypertension    H/O: CVA (cerebrovascular accident) 11/08/2020   Colon cancer screening 02/05/2018   Degenerative joint disease (DJD) of lumbar spine 05/02/2017   Internal hemorrhoids 05/02/2017   Generalized osteoarthritis of hand 12/11/2016   GAD (generalized anxiety disorder) 12/11/2016   Estrogen deficiency 05/11/2016   Encounter for screening mammogram for breast cancer 05/11/2016   OSA (obstructive sleep apnea) 03/06/2016   Left knee pain 10/18/2015   Snoring 10/18/2015   Routine general  medical examination at a health care facility 02/28/2015   Hyperlipidemia 08/27/2012   Osteopenia 12/17/2009   BACK PAIN, LUMBAR 07/02/2009   IRRITABLE BOWEL SYNDROME 04/02/2009   Asymptomatic postmenopausal status 12/10/2008   Hypothyroidism 12/05/2007   Meniere's disease 12/05/2007   GERD 12/05/2007    PCP: Dr. Laine Balls  REFERRING PROVIDER: Dr. Ozell Bruch  REFERRING DIAG: R hip fracture  THERAPY DIAG:  Abnormality of gait and mobility  Muscle weakness (generalized)  Difficulty in walking, not elsewhere classified  Other abnormalities of gait and mobility  Pain in right hip  Other lack of coordination  Rationale for  Evaluation and Treatment: Rehabilitation  ONSET DATE: 09/06/2023  SUBJECTIVE:   SUBJECTIVE STATEMENT: From Today: Patient reports no new complaints- just some soreness from the ultrasound treatments.   From Eval:  Patient reports feeling better overall and states her MD wants her back in PT to work her out and strengthen her hip and work on balance. She denies any recent falls and reports her hip is feeling better since she was in PT back in June.   PERTINENT HISTORY: Sarah Harper is a 83 year old female with past medical history significant for HTN, hypothyroidism, GERD, osteopenia, dyslipidemia, OSA who presented to [facility] ED on 1/15 fall mechanical fall at home sustaining injury to her right hip. Patient underwent intramedullary nailing of the right hip 09/06/2023 by Dr. Bruch. Patient was receiving outpatient PT in May however was admitted back to hospital with Hyponatremia and acute metabolic encephalopathy. She has continued to be followed since release from hospital by Dr. Bruch for ongoing hip pain and received a bone stimulator/ultrasound device to help with non-healing hip and -Ultrasound/bone stimulator treatments 2 sets of 6 week treatment. Has competed 80/90 treatemnt and has f/u with Dr. Bruch on 10/29.  PAIN:  Are you having pain? Yes: NPRS scale: 2/10  Pain location: Right posterior superior gluteal Pain description: aggravating achy- constant Aggravating factors: Worse with ultrasound treatment Relieving factors: lying flat  PRECAUTIONS: Other: WBAT RLE and no restrictions per MD order  RED FLAGS: None   WEIGHT BEARING RESTRICTIONS: R LE WBAT  FALLS:  Has patient fallen in last 6 months? No  LIVING ENVIRONMENT: Lives with: lives with their spouse Lives in: House/apartment Stairs: Yes: Internal: 2 steps; on right going up Has following equipment at home: Vannie - 2 wheeled and Grab bars  OCCUPATION: Stage manager  PLOF: Independent  PATIENT GOALS: Hope  to be able to walk on my own without a device and be able to drive independently  NEXT MD VISIT: 06/18/2024- Dr. Bruch  OBJECTIVE:   Note: Objective measures were completed at Evaluation unless otherwise noted.  DIAGNOSTIC FINDINGS:  ADDENDUM REPORT: 03/05/2024 08:07   ADDENDUM: The original report was by Dr. Ryan Salvage. The following addendum is by Dr. Ryan Salvage:   With respect to further specifics on the intertrochanteric fracture, the original fracture as shown on the radiographs from 09/05/2023 does include a small comminuted avulsion of the lesser trochanter, with a component measuring 2.4 by 1.0 by 0.6 cm, shown for example on image 63 series 5 and image 102 series 2. It is conceivable that the lucency described as likely hyperemic could have been a donor site for this fragment, although the fragment is about 1.9 cm posterior to this lucency, and the natural action of the iliopsoas would tend to pull the fragment anteriorly and cephalad rather than posteriorly.   With respect to infection as a cause for the lucency,  I am skeptical given the lack of abnormal lucency extending along the hardware, although if the patient is experiencing fever and signs of infection then close monitoring would be recommended.     Electronically Signed   By: Ryan Salvage M.D.   On: 03/05/2024 08:07    Addended by Salvage Sawyers, MD on 03/05/2024  8:09 AM    Study Result  Narrative & Impression  CLINICAL DATA:  Right SI joint pain. Prior right hip intertrochanteric fracture ORIF in January 2025.   EXAM: CT PELVIS WITHOUT CONTRAST   TECHNIQUE: Multidetector CT imaging of the pelvis was performed following the standard protocol without intravenous contrast.   RADIATION DOSE REDUCTION: This exam was performed according to the departmental dose-optimization program which includes automated exposure control, adjustment of the mA and/or kV according to patient size  and/or use of iterative reconstruction technique.   COMPARISON:  Multiple exams, including radiographs 09/05/2023   FINDINGS: OSSEOUS STRUCTURES AND JOINTS:   Lower lumbar degenerative facet arthropathy. No SI joint diastasis. A small amount of nitrogen gas phenomenon in both SI joints without SI joint effusion observed. No erosions or other specific abnormalities along the SI joints aside from mild spurring.   No pelvic fracture observed.   Right femoral nail system in place with single distal interlocking screw and femoral head screw noted. This traverses the intertrochanteric fracture which is still visible, with notable lucency along part of the medial fracture margin on image 52 series 5 and image 99 series 2 probably from hyperemic resorption of bone in this local vicinity. There is potentially some early bridging of bone anteriorly at the fracture site. Early cortication along the posterior portion of the fracture may suggest early nonunion of the posterior portion for example as on image 62 series 5.   No abnormal lucency around the components of the nail system to indicate loosening or infection.   MUSCULOTENDINOUS:   Unremarkable   SACRAL PLEXUS:   No impinging lesion along the sacral plexus or proximal sciatic nerves.   OTHER:   Prominent stool throughout the colon favors constipation. Mild degenerative arthropathy of the left (contralateral) hip.   IMPRESSION: 1. No specific abnormality is identified to explain the patient's right SI joint pain. 2. Right femoral nail system in place traversing the intertrochanteric fracture. Questionable partial early nonunion of the posterior portion of the fracture anterior bridging is appreciated. There is some accentuated lucency along the medial portion of the fracture probably from hyperemic resorption of bone, but no abnormal extension of lucency along components of the IM nail or femoral head nail to suggest  loosening/infection. 3. Prominent stool throughout the colon favors constipation. 4. Mild degenerative arthropathy of the left (contralateral) hip. 5. Lower lumbar degenerative facet arthropathy.   Electronically Signed: By: Ryan Salvage M.D. On: 01/15/2024 16:54     PATIENT SURVEYS:  ABC scale: The Activities-Specific Balance Confidence (ABC) Scale 0% 10 20 30  40 50 60 70 80 90 100% No confidence<->completely confident  "How confident are you that you will not lose your balance or become unsteady when you . . .   Date tested 06/10/2024  Walk around the house 80%  2. Walk up or down stairs 20%  3. Bend over and pick up a slipper from in front of a closet floor 30%  4. Reach for a small can off a shelf at eye level 80%  5. Stand on tip toes and reach for something above your head 20%  6. Stand on  a chair and reach for something 0%  7. Sweep the floor 60%  8. Walk outside the house to a car parked in the driveway 50%  9. Get into or out of a car 50%  10. Walk across a parking lot to the mall 10%  11. Walk up or down a ramp 50%  12. Walk in a crowded mall where people rapidly walk past you 10%  13. Are bumped into by people as you walk through the mall 10%  14. Step onto or off of an escalator while you are holding onto the railing 20%  15. Step onto or off an escalator while holding onto parcels such that you cannot hold onto the railing 10%  16. Walk outside on icy sidewalks 0%  Total: #/16 26.25%     COGNITION: Overall cognitive status: Within functional limits for tasks assessed     SENSATION: WFL   POSTURE: rounded shoulders and forward head  PALPATION: (+) tenderness (lateral hip and posteior right off edge of R sided sacrum)   LOWER EXTREMITY ROM:  Active ROM Right eval Left eval  Hip flexion    Hip extension    Hip abduction    Hip adduction    Hip internal rotation    Hip external rotation    Knee flexion    Knee extension    Ankle  dorsiflexion    Ankle plantarflexion    Ankle inversion    Ankle eversion     (Blank rows = not tested)  LOWER EXTREMITY MMT:  MMT Right eval Left eval  Hip flexion 4 4+  Hip extension    Hip abduction 4 4+  Hip adduction    Hip internal rotation 4 4+  Hip external rotation 4 4+  Knee flexion 4 4  Knee extension 4 4  Ankle dorsiflexion 4+ 4+  Ankle plantarflexion    Ankle inversion    Ankle eversion     (Blank rows = not tested)  LOWER EXTREMITY SPECIAL TESTS:  None  FUNCTIONAL TESTS:  30 seconds chair stand test= Unable to stand without UE support today but did perform 10 reps with min UE support Timed up and go (TUG): 15.10 and 15.33=15.2 sec with RW 6 minute walk test: to be assessed next visit. 10 meter walk test: 12.10 and 11.69 sec =0.85 m/s avg with RW  Berg Balance Scale:  Item Test date: 06/10/2024 Date:  Date:   Sitting to standing 3. able to stand independently using hands Insert SmartPhrase OPRCBERGREEVAL Insert SmartPhrase OPRCBERGREEVAL  2. Standing unsupported 4. able to stand safely for 2 minutes    3. Sitting with back unsupported, feet supported 4. able to sit safely and securely for 2 minutes    4. Standing to sitting 3. controls descent by using hands    5. Pivot transfer  3. able to transfer safely with definite need of hands    6. Standing unsupported with eyes closed 4. able to stand 10 seconds safely    7. Standing unsupported with feet together 4. able to place feet together independently and stand 1 minute safely    8. Reaching forward with outstretched arms while standing 4. can reach forward confidently 25 cm (10 inches)    9. Pick up object from the floor from standing 3. able to pick up slipper but needs supervision    10. Turning to look behind over left and right shoulders while standing 2. turns sideways but only maintains balance    11.  Turn 360 degrees 2. able to turn 360 degrees safely but slowly    12. Place alternate foot on step or  stool while standing unsupported 3. able to stand independently and complete 8 steps in > 20 seconds     13. Standing unsupported one foot in front 2. able to take small step independently and hold 30 seconds    14. Standing on one leg 1. tries to lift leg unable to hold 3 seconds but remains standing independently.      Total Score 42/56 Total Score:    Total Score:      GAIT: Distance walked: 150 feet Assistive device utilized: Walker - 2 wheeled Level of assistance: CGA Comments: Mild antalgia noted on right and decreased step length - short reciprocal steps with use of RW                                                                                                                                TREATMENT DATE: 07/28/24    Unless otherwise stated, CGA was provided and gait belt donned in order to ensure pt safety  Patient reported some vertigo over the weekend and a little dizziness getting up and down-  BP= 126/66 mmHg HR= 60 bpm- (seated) - asymptomatic BP=  109/63 mmHg HR=63 bpm (standing) - asymptomatic BP= 127/72 mmHg HR= 65 bpm (standing after 3 min) - asymptomatic   THEREX: Standing hip hike- 2.5# AW x 12 reps each Standing Donkey kick 2.5# AW each LE x 12 reps each    Therapeutic Activities:  -Side step up/over orange hurdle x 12 reps (2.5#)  -Resistive gait with SPC - 2.5 # x 450 feet - good reciprocal step with no LOB or signs of unsteadiness. Patient reports I can feel those weights.  - Lateral step up/step down (4 step)  using 2.5# AW x 15 reps alt LE    NMR:  -Dynamic SLS with step tap with opp LE - 3 spike balls (1 ant/1 later/1 post) = 1 rep x 8 reps each LE  -Dynamic hip march on airex pad 2 sets x15 reps (VC to perform slowly)  - Static stand on airex pad - staggered feet - with eyes open x 30 sec each side x 2. Progressed to [facility]               PATIENT EDUCATION:  Education details: rationale for 6 min walk and instruction in HEP Person  educated: Patient and Spouse Education method: Explanation Education comprehension: verbalized understanding  HOME EXERCISE PROGRAM: Access Code: [email or web address] URL: [email or web address] Date: 06/16/2024 Prepared by: Reyes London  Exercises - Single Leg Stance  - 3 x weekly - 5-10 sets - up to 20 sec hold - Standing Hip Flexion March  - 3 x weekly - 3 sets - 10 reps - 2 sec hold - Tandem Stance  - 3 x weekly - 5 sets - 30 sec  hold - Side  Stepping with Resistance at Ankles  - 3 x weekly - 3 sets - 10 reps - Sit to Stand with Arms Crossed  - 3 x weekly - 3 sets - 10 reps  ASSESSMENT:  CLINICAL IMPRESSION: Patient presenting with more Right sided hip soreness yet able to complete all activities. She was reporting some vertigo - dizzy symptoms prior to today and her BP was lower with initial standing but improved with increased standing time. Pt will continue to benefit from skilled physical therapy intervention to address impairments, improve QOL, and attain therapy goals.    OBJECTIVE IMPAIRMENTS: Abnormal gait, cardiopulmonary status limiting activity, decreased activity tolerance, decreased balance, decreased coordination, decreased endurance, decreased mobility, difficulty walking, decreased strength, and pain.   ACTIVITY LIMITATIONS: carrying, lifting, bending, sitting, standing, squatting, stairs, and transfers  PARTICIPATION LIMITATIONS: meal prep, cleaning, laundry, driving, shopping, community activity, and yard work  PERSONAL FACTORS: 1-2 comorbidities: HTN, Arthritis are also affecting patient's functional outcome.   REHAB POTENTIAL: Good  CLINICAL DECISION MAKING: Stable/uncomplicated  EVALUATION COMPLEXITY: Low   GOALS: Goals reviewed with patient? Yes  SHORT TERM GOALS: Target date: 07/22/2024 Pt will be independent with HEP in order to improve strength and balance in order to decrease fall risk and improve function at home. Baseline: EVAL-  Patient reports has not been performing any former HEP since her hospitalization in June; 07/16/2024= HEP are going pretty well, no pain and compliant as best I can.  Goal status: MET   LONG TERM GOALS: Target date: 09/02/2024  Patient will report < 4/10 Right hip pain at worst with all functional activities.  Baseline: EVAL= worst = 7/10; 07/16/2024= 0/10 current but up to 6/10 at worst Goal status: PROGRESS  2.  Patient will complete >12 reps without use of UE support in 30 second sit to stand test indicating an increased LE strength and improved balance. Baseline: EVAL: Unable to stand without UE support today but did perform 10 reps with min UE support Goal status: INITIAL  3.  Patient will improve ABC score by 15 points to demonstrate statistically significant improvement in mobility and quality of life as it relates to their confidence in their balance.  Baseline: EVAL: 26.25 Goal status: INITIAL   4.  Patient will increase Berg Balance score by > 6 points to demonstrate decreased fall risk during functional activities. Baseline: EVAL= 42/56 Goal status: INITIAL   5.   Patient will reduce timed up and go to <11 seconds to reduce fall risk and demonstrate improved transfer/gait ability. Baseline: EVAL= 15.2 sec with RW Goal status: INITIAL  6.   Patient will increase 10 meter walk test to >1.59m/s as to improve gait speed for better community ambulation and to reduce fall risk. Baseline: EVAL= 0.85 m/s with RW Goal status: INITIAL  7.   Patient will increase six minute walk test distance to >1000 for progression to community ambulator and improve gait ability Baseline: Eval= To be completed at visit #2; 06/16/2024= 805 feet with RW Goal status: INITIAL    PLAN:  PT FREQUENCY: 1-2x/week  PT DURATION: 12 weeks  PLANNED INTERVENTIONS: [id number]- PT Re-evaluation, 97750- Physical Performance Testing, 97110-Therapeutic exercises, 97530- Therapeutic activity, [id number]- Neuromuscular  re-education, 97535- Self Care, [id number]- Manual therapy, [id number]- Gait training, [id number]- Orthotic Initial, [id number]- Orthotic/Prosthetic subsequent, [phone]- Canalith repositioning, [id number]- Electrical stimulation (manual), [phone] (1-2 muscles), [id number] (3+ muscles)- Dry Needling, Patient/Family education, Balance training, Stair training, Taping, Joint mobilization, Joint manipulation, Spinal mobilization, Vestibular training, DME instructions, Cryotherapy, and Moist heat  PLAN FOR NEXT SESSION:  R hip strengthening Gait training with cane Balance training Add to HEP as appropriate.    Reyes LOISE London, PT 07/28/2024, 11:22 AM

## 2024-07-30 ENCOUNTER — Ambulatory Visit

## 2024-07-30 ENCOUNTER — Ambulatory Visit: Payer: Self-pay | Admitting: Family Medicine

## 2024-07-30 DIAGNOSIS — R269 Unspecified abnormalities of gait and mobility: Secondary | ICD-10-CM | POA: Diagnosis not present

## 2024-07-30 DIAGNOSIS — M25551 Pain in right hip: Secondary | ICD-10-CM

## 2024-07-30 DIAGNOSIS — R262 Difficulty in walking, not elsewhere classified: Secondary | ICD-10-CM

## 2024-07-30 DIAGNOSIS — R2689 Other abnormalities of gait and mobility: Secondary | ICD-10-CM

## 2024-07-30 DIAGNOSIS — M6281 Muscle weakness (generalized): Secondary | ICD-10-CM

## 2024-07-30 DIAGNOSIS — R278 Other lack of coordination: Secondary | ICD-10-CM

## 2024-07-30 LAB — COLOGUARD

## 2024-07-30 NOTE — Therapy (Signed)
 OUTPATIENT PHYSICAL THERAPY LOWER EXTREMITY TREATMENT   Patient Name: Sarah Harper MRN: [id number] DOB:November 04, 1940, 83 y.o., female Today's Date: 07/30/2024  END OF SESSION:  PT End of Session - 07/30/24 1017     Visit Number 14    Number of Visits 24    Date for Recertification  09/03/23    Progress Note Due on Visit 20    PT Start Time 1016    PT Stop Time 1058    PT Time Calculation (min) 42 min    Equipment Utilized During Treatment Gait belt    Activity Tolerance Patient tolerated treatment well    Behavior During Therapy WFL for tasks assessed/performed              Past Medical History:  Diagnosis Date   Anxiety 2022   Arthritis    Blood loss anemia 09/13/2023   Cataract 1997   Colon cancer screening 02/05/2018   DDD (degenerative disc disease)    chronic back pain   ETD (eustachian tube dysfunction)    GERD (gastroesophageal reflux disease)    Hypertension 2022   IBS (irritable bowel syndrome)    Meniere's disease    Migraine    still gets visual aura from time to time   Mild cognitive impairment    Osteoporosis    Sleep apnea 2017   CPAP   Stroke (HCC) 2022   Syncopal episodes    after work-up - possible seizures?   Thyroid  disease 1990   hypothyroid   TIA (transient ischemic attack) 11/09/2020   Past Surgical History:  Procedure Laterality Date   APPENDECTOMY     CATARACT EXTRACTION W/PHACO Right 05/21/2019   Procedure: CATARACT EXTRACTION PHACO AND INTRAOCULAR LENS PLACEMENT (IOC) RIGHT panoptix lens  00:35.0  21.7%  7.61;  Surgeon: Mittie Gaskin, MD;  Location: [facility];  Service: Ophthalmology;  Laterality: Right;  sleep apnea requests early   CATARACT EXTRACTION W/PHACO Left 06/11/2019   Procedure: CATARACT EXTRACTION PHACO AND INTRAOCULAR LENS PLACEMENT (IOC) LEFT PANOPTIX TORIC LENS  00:50.0  20.3%  10.21;  Surgeon: Mittie Gaskin, MD;  Location: [facility];  Service: Ophthalmology;  Laterality: Left;   sleep apnea requests early   COLONOSCOPY     last 2012 - [facility]   ESOPHAGOGASTRODUODENOSCOPY     Multiple, last 01/07/2017 with Savary dilation to 18 mm   EYE SURGERY  August 2022   Cataracs removed   FRACTURE SURGERY  2025   HEMORRHOID BANDING  2018   [facility]   INTRAMEDULLARY (IM) NAIL INTERTROCHANTERIC Right 09/06/2023   Procedure: INTRAMEDULLARY (IM) NAIL INTERTROCHANTERIC;  Surgeon: Celena Sharper, MD;  Location: [facility];  Service: Orthopedics;  Laterality: Right;   LUMBAR DISC SURGERY  1984   L5    Patient Active Problem List   Diagnosis Date Noted   Vitamin D  deficiency 01/29/2024   Closed right hip fracture (HCC) 09/05/2023   Current use of proton pump inhibitor 03/13/2023   Pain in joint, lower leg 01/30/2023   Migraine    Essential hypertension    H/O: CVA (cerebrovascular accident) 11/08/2020   Colon cancer screening 02/05/2018   Degenerative joint disease (DJD) of lumbar spine 05/02/2017   Internal hemorrhoids 05/02/2017   Generalized osteoarthritis of hand 12/11/2016   GAD (generalized anxiety disorder) 12/11/2016   Estrogen deficiency 05/11/2016   Encounter for screening mammogram for breast cancer 05/11/2016   OSA (obstructive sleep apnea) 03/06/2016   Left knee pain 10/18/2015   Snoring 10/18/2015   Routine general  medical examination at a health care facility 02/28/2015   Hyperlipidemia 08/27/2012   Osteopenia 12/17/2009   BACK PAIN, LUMBAR 07/02/2009   IRRITABLE BOWEL SYNDROME 04/02/2009   Asymptomatic postmenopausal status 12/10/2008   Hypothyroidism 12/05/2007   Meniere's disease 12/05/2007   GERD 12/05/2007    PCP: Dr. Laine Balls  REFERRING PROVIDER: Dr. Ozell Bruch  REFERRING DIAG: R hip fracture  THERAPY DIAG:  Abnormality of gait and mobility  Muscle weakness (generalized)  Difficulty in walking, not elsewhere classified  Other abnormalities of gait and mobility  Pain in right hip  Other lack of coordination  Rationale for  Evaluation and Treatment: Rehabilitation  ONSET DATE: 09/06/2023  SUBJECTIVE:   SUBJECTIVE STATEMENT: From Today: Patient reports she was able to get a sooner MD appointment for f/u 12/22 now.   From Eval:  Patient reports feeling better overall and states her MD wants her back in PT to work her out and strengthen her hip and work on balance. She denies any recent falls and reports her hip is feeling better since she was in PT back in June.   PERTINENT HISTORY: Sarah Harper is a 83 year old female with past medical history significant for HTN, hypothyroidism, GERD, osteopenia, dyslipidemia, OSA who presented to [facility] ED on 1/15 fall mechanical fall at home sustaining injury to her right hip. Patient underwent intramedullary nailing of the right hip 09/06/2023 by Dr. Bruch. Patient was receiving outpatient PT in May however was admitted back to hospital with Hyponatremia and acute metabolic encephalopathy. She has continued to be followed since release from hospital by Dr. Bruch for ongoing hip pain and received a bone stimulator/ultrasound device to help with non-healing hip and -Ultrasound/bone stimulator treatments 2 sets of 6 week treatment. Has competed 80/90 treatemnt and has f/u with Dr. Bruch on 10/29.  PAIN:  Are you having pain? Yes: NPRS scale: 2/10  Pain location: Right posterior superior gluteal Pain description: aggravating achy- constant Aggravating factors: Worse with ultrasound treatment Relieving factors: lying flat  PRECAUTIONS: Other: WBAT RLE and no restrictions per MD order  RED FLAGS: None   WEIGHT BEARING RESTRICTIONS: R LE WBAT  FALLS:  Has patient fallen in last 6 months? No  LIVING ENVIRONMENT: Lives with: lives with their spouse Lives in: House/apartment Stairs: Yes: Internal: 2 steps; on right going up Has following equipment at home: Vannie - 2 wheeled and Grab bars  OCCUPATION: Stage manager  PLOF: Independent  PATIENT GOALS: Hope to be  able to walk on my own without a device and be able to drive independently  NEXT MD VISIT: 06/18/2024- Dr. Bruch  OBJECTIVE:   Note: Objective measures were completed at Evaluation unless otherwise noted.  DIAGNOSTIC FINDINGS:  ADDENDUM REPORT: 03/05/2024 08:07   ADDENDUM: The original report was by Dr. Ryan Salvage. The following addendum is by Dr. Ryan Salvage:   With respect to further specifics on the intertrochanteric fracture, the original fracture as shown on the radiographs from 09/05/2023 does include a small comminuted avulsion of the lesser trochanter, with a component measuring 2.4 by 1.0 by 0.6 cm, shown for example on image 63 series 5 and image 102 series 2. It is conceivable that the lucency described as likely hyperemic could have been a donor site for this fragment, although the fragment is about 1.9 cm posterior to this lucency, and the natural action of the iliopsoas would tend to pull the fragment anteriorly and cephalad rather than posteriorly.   With respect to infection as a cause  for the lucency, I am skeptical given the lack of abnormal lucency extending along the hardware, although if the patient is experiencing fever and signs of infection then close monitoring would be recommended.     Electronically Signed   By: Ryan Salvage M.D.   On: 03/05/2024 08:07    Addended by Salvage Sawyers, MD on 03/05/2024  8:09 AM    Study Result  Narrative & Impression  CLINICAL DATA:  Right SI joint pain. Prior right hip intertrochanteric fracture ORIF in January 2025.   EXAM: CT PELVIS WITHOUT CONTRAST   TECHNIQUE: Multidetector CT imaging of the pelvis was performed following the standard protocol without intravenous contrast.   RADIATION DOSE REDUCTION: This exam was performed according to the departmental dose-optimization program which includes automated exposure control, adjustment of the mA and/or kV according to patient size  and/or use of iterative reconstruction technique.   COMPARISON:  Multiple exams, including radiographs 09/05/2023   FINDINGS: OSSEOUS STRUCTURES AND JOINTS:   Lower lumbar degenerative facet arthropathy. No SI joint diastasis. A small amount of nitrogen gas phenomenon in both SI joints without SI joint effusion observed. No erosions or other specific abnormalities along the SI joints aside from mild spurring.   No pelvic fracture observed.   Right femoral nail system in place with single distal interlocking screw and femoral head screw noted. This traverses the intertrochanteric fracture which is still visible, with notable lucency along part of the medial fracture margin on image 52 series 5 and image 99 series 2 probably from hyperemic resorption of bone in this local vicinity. There is potentially some early bridging of bone anteriorly at the fracture site. Early cortication along the posterior portion of the fracture may suggest early nonunion of the posterior portion for example as on image 62 series 5.   No abnormal lucency around the components of the nail system to indicate loosening or infection.   MUSCULOTENDINOUS:   Unremarkable   SACRAL PLEXUS:   No impinging lesion along the sacral plexus or proximal sciatic nerves.   OTHER:   Prominent stool throughout the colon favors constipation. Mild degenerative arthropathy of the left (contralateral) hip.   IMPRESSION: 1. No specific abnormality is identified to explain the patient's right SI joint pain. 2. Right femoral nail system in place traversing the intertrochanteric fracture. Questionable partial early nonunion of the posterior portion of the fracture anterior bridging is appreciated. There is some accentuated lucency along the medial portion of the fracture probably from hyperemic resorption of bone, but no abnormal extension of lucency along components of the IM nail or femoral head nail to suggest  loosening/infection. 3. Prominent stool throughout the colon favors constipation. 4. Mild degenerative arthropathy of the left (contralateral) hip. 5. Lower lumbar degenerative facet arthropathy.   Electronically Signed: By: Ryan Salvage M.D. On: 01/15/2024 16:54     PATIENT SURVEYS:  ABC scale: The Activities-Specific Balance Confidence (ABC) Scale 0% 10 20 30  40 50 60 70 80 90 100% No confidence<->completely confident  How confident are you that you will not lose your balance or become unsteady when you . . .   Date tested 06/10/2024  Walk around the house 80%  2. Walk up or down stairs 20%  3. Bend over and pick up a slipper from in front of a closet floor 30%  4. Reach for a small can off a shelf at eye level 80%  5. Stand on tip toes and reach for something above your head 20%  6. Stand on a chair and reach for something 0%  7. Sweep the floor 60%  8. Walk outside the house to a car parked in the driveway 50%  9. Get into or out of a car 50%  10. Walk across a parking lot to the mall 10%  11. Walk up or down a ramp 50%  12. Walk in a crowded mall where people rapidly walk past you 10%  13. Are bumped into by people as you walk through the mall 10%  14. Step onto or off of an escalator while you are holding onto the railing 20%  15. Step onto or off an escalator while holding onto parcels such that you cannot hold onto the railing 10%  16. Walk outside on icy sidewalks 0%  Total: #/16 26.25%     COGNITION: Overall cognitive status: Within functional limits for tasks assessed     SENSATION: WFL   POSTURE: rounded shoulders and forward head  PALPATION: (+) tenderness (lateral hip and posteior right off edge of R sided sacrum)   LOWER EXTREMITY ROM:  Active ROM Right eval Left eval  Hip flexion    Hip extension    Hip abduction    Hip adduction    Hip internal rotation    Hip external rotation    Knee flexion    Knee extension    Ankle  dorsiflexion    Ankle plantarflexion    Ankle inversion    Ankle eversion     (Blank rows = not tested)  LOWER EXTREMITY MMT:  MMT Right eval Left eval  Hip flexion 4 4+  Hip extension    Hip abduction 4 4+  Hip adduction    Hip internal rotation 4 4+  Hip external rotation 4 4+  Knee flexion 4 4  Knee extension 4 4  Ankle dorsiflexion 4+ 4+  Ankle plantarflexion    Ankle inversion    Ankle eversion     (Blank rows = not tested)  LOWER EXTREMITY SPECIAL TESTS:  None  FUNCTIONAL TESTS:  30 seconds chair stand test= Unable to stand without UE support today but did perform 10 reps with min UE support Timed up and go (TUG): 15.10 and 15.33=15.2 sec with RW 6 minute walk test: to be assessed next visit. 10 meter walk test: 12.10 and 11.69 sec =0.85 m/s avg with RW  Berg Balance Scale:  Item Test date: 06/10/2024 Date:  Date:   Sitting to standing 3. able to stand independently using hands Insert SmartPhrase OPRCBERGREEVAL Insert SmartPhrase OPRCBERGREEVAL  2. Standing unsupported 4. able to stand safely for 2 minutes    3. Sitting with back unsupported, feet supported 4. able to sit safely and securely for 2 minutes    4. Standing to sitting 3. controls descent by using hands    5. Pivot transfer  3. able to transfer safely with definite need of hands    6. Standing unsupported with eyes closed 4. able to stand 10 seconds safely    7. Standing unsupported with feet together 4. able to place feet together independently and stand 1 minute safely    8. Reaching forward with outstretched arms while standing 4. can reach forward confidently 25 cm (10 inches)    9. Pick up object from the floor from standing 3. able to pick up slipper but needs supervision    10. Turning to look behind over left and right shoulders while standing 2. turns sideways but only maintains balance  11. Turn 360 degrees 2. able to turn 360 degrees safely but slowly    12. Place alternate foot on step or  stool while standing unsupported 3. able to stand independently and complete 8 steps in > 20 seconds     13. Standing unsupported one foot in front 2. able to take small step independently and hold 30 seconds    14. Standing on one leg 1. tries to lift leg unable to hold 3 seconds but remains standing independently.      Total Score 42/56 Total Score:    Total Score:      GAIT: Distance walked: 150 feet Assistive device utilized: Walker - 2 wheeled Level of assistance: CGA Comments: Mild antalgia noted on right and decreased step length - short reciprocal steps with use of RW                                                                                                                                TREATMENT DATE: 07/30/24    Unless otherwise stated, CGA was provided and gait belt donned in order to ensure pt safety   THEREX: Standing hip hike-  12 reps each side  Therapeutic Activities:  -Sit to stand 2 sets of 10 reps -Fwd lunge squat walk x8 feet then back x 2  -Steps- up/down with minimal BUE Support -no pain reported -Side step  from standing on airex pad- up onto the step and over to another airex pad on opp side of step x 12 reps  -Walking without AD x 450 feet (mild initial unsteadiness yet no LOB)     NMR:  -Standing on airex pad - step tap onto the the step x 20 reps each LE  -Side stepping along 10 feet distance - open area with no UE support - back and forth x 8 -Dynamic high knee march walk 10 feet f/b bwd walking back to start x 8 w/o UE support.  -Tandem gait (open floor area) - 10 feet distance -down and back x 5.           PATIENT EDUCATION:  Education details: rationale for 6 min walk and instruction in HEP Person educated: Patient and Spouse Education method: Explanation Education comprehension: verbalized understanding  HOME EXERCISE PROGRAM: Access Code: [email or web address] URL: [email or web address] Date: 06/16/2024 Prepared by:  Reyes London  Exercises - Single Leg Stance  - 3 x weekly - 5-10 sets - up to 20 sec hold - Standing Hip Flexion March  - 3 x weekly - 3 sets - 10 reps - 2 sec hold - Tandem Stance  - 3 x weekly - 5 sets - 30 sec  hold - Side Stepping with Resistance at Ankles  - 3 x weekly - 3 sets - 10 reps - Sit to Stand with Arms Crossed  - 3 x weekly - 3 sets - 10 reps  ASSESSMENT:  CLINICAL IMPRESSION: Treatment focused on challenged patient today  with security of balance- performing activities in open area without support bar or // bars. She was initially very nervous but performed well overall- gaining some much needed confidence in her dynamic balance. At end of session she was able to walk several laps without a device and improved her gait quality from initially unsteady and decreased step length to better reciprocal steps. Pt will continue to benefit from skilled physical therapy intervention to address impairments, improve QOL, and attain therapy goals.    OBJECTIVE IMPAIRMENTS: Abnormal gait, cardiopulmonary status limiting activity, decreased activity tolerance, decreased balance, decreased coordination, decreased endurance, decreased mobility, difficulty walking, decreased strength, and pain.   ACTIVITY LIMITATIONS: carrying, lifting, bending, sitting, standing, squatting, stairs, and transfers  PARTICIPATION LIMITATIONS: meal prep, cleaning, laundry, driving, shopping, community activity, and yard work  PERSONAL FACTORS: 1-2 comorbidities: HTN, Arthritis are also affecting patient's functional outcome.   REHAB POTENTIAL: Good  CLINICAL DECISION MAKING: Stable/uncomplicated  EVALUATION COMPLEXITY: Low   GOALS: Goals reviewed with patient? Yes  SHORT TERM GOALS: Target date: 07/22/2024 Pt will be independent with HEP in order to improve strength and balance in order to decrease fall risk and improve function at home. Baseline: EVAL- Patient reports has not been performing any  former HEP since her hospitalization in June; 07/16/2024= HEP are going pretty well, no pain and compliant as best I can.  Goal status: MET   LONG TERM GOALS: Target date: 09/02/2024  Patient will report < 4/10 Right hip pain at worst with all functional activities.  Baseline: EVAL= worst = 7/10; 07/16/2024= 0/10 current but up to 6/10 at worst Goal status: PROGRESS  2.  Patient will complete >12 reps without use of UE support in 30 second sit to stand test indicating an increased LE strength and improved balance. Baseline: EVAL: Unable to stand without UE support today but did perform 10 reps with min UE support Goal status: INITIAL  3.  Patient will improve ABC score by 15 points to demonstrate statistically significant improvement in mobility and quality of life as it relates to their confidence in their balance.  Baseline: EVAL: 26.25 Goal status: INITIAL   4.  Patient will increase Berg Balance score by > 6 points to demonstrate decreased fall risk during functional activities. Baseline: EVAL= 42/56 Goal status: INITIAL   5.   Patient will reduce timed up and go to <11 seconds to reduce fall risk and demonstrate improved transfer/gait ability. Baseline: EVAL= 15.2 sec with RW Goal status: INITIAL  6.   Patient will increase 10 meter walk test to >1.72m/s as to improve gait speed for better community ambulation and to reduce fall risk. Baseline: EVAL= 0.85 m/s with RW Goal status: INITIAL  7.   Patient will increase six minute walk test distance to >1000 for progression to community ambulator and improve gait ability Baseline: Eval= To be completed at visit #2; 06/16/2024= 805 feet with RW Goal status: INITIAL    PLAN:  PT FREQUENCY: 1-2x/week  PT DURATION: 12 weeks  PLANNED INTERVENTIONS: [id number]- PT Re-evaluation, 97750- Physical Performance Testing, 97110-Therapeutic exercises, 97530- Therapeutic activity, [id number]- Neuromuscular re-education, 97535- Self Care, [id number]-  Manual therapy, [id number]- Gait training, [id number]- Orthotic Initial, [id number]- Orthotic/Prosthetic subsequent, [phone]- Canalith repositioning, [id number]- Electrical stimulation (manual), [phone] (1-2 muscles), [id number] (3+ muscles)- Dry Needling, Patient/Family education, Balance training, Stair training, Taping, Joint mobilization, Joint manipulation, Spinal mobilization, Vestibular training, DME instructions, Cryotherapy, and Moist heat  PLAN FOR NEXT SESSION:  R hip strengthening Gait training with cane Balance training  Add to HEP as appropriate.    Reyes LOISE London, PT 07/30/2024, 5:56 PM

## 2024-08-04 ENCOUNTER — Ambulatory Visit

## 2024-08-04 ENCOUNTER — Ambulatory Visit: Admitting: Physician Assistant

## 2024-08-04 DIAGNOSIS — R2689 Other abnormalities of gait and mobility: Secondary | ICD-10-CM

## 2024-08-04 DIAGNOSIS — M25551 Pain in right hip: Secondary | ICD-10-CM

## 2024-08-04 DIAGNOSIS — R269 Unspecified abnormalities of gait and mobility: Secondary | ICD-10-CM

## 2024-08-04 DIAGNOSIS — R262 Difficulty in walking, not elsewhere classified: Secondary | ICD-10-CM

## 2024-08-04 DIAGNOSIS — M6281 Muscle weakness (generalized): Secondary | ICD-10-CM

## 2024-08-04 DIAGNOSIS — R278 Other lack of coordination: Secondary | ICD-10-CM

## 2024-08-04 NOTE — Therapy (Signed)
 OUTPATIENT PHYSICAL THERAPY LOWER EXTREMITY TREATMENT   Patient Name: Sarah Harper MRN: [id number] DOB:10/03/1940, 83 y.o., female Today's Date: 08/04/2024  END OF SESSION:  PT End of Session - 08/04/24 1023     Visit Number 15    Number of Visits 24    Date for Recertification  09/03/23    Progress Note Due on Visit 20    PT Start Time 1015    PT Stop Time 1059    PT Time Calculation (min) 44 min    Equipment Utilized During Treatment Gait belt    Activity Tolerance Patient tolerated treatment well    Behavior During Therapy WFL for tasks assessed/performed              Past Medical History:  Diagnosis Date   Anxiety 2022   Arthritis    Blood loss anemia 09/13/2023   Cataract 1997   Colon cancer screening 02/05/2018   DDD (degenerative disc disease)    chronic back pain   ETD (eustachian tube dysfunction)    GERD (gastroesophageal reflux disease)    Hypertension 2022   IBS (irritable bowel syndrome)    Meniere's disease    Migraine    still gets visual aura from time to time   Mild cognitive impairment    Osteoporosis    Sleep apnea 2017   CPAP   Stroke (HCC) 2022   Syncopal episodes    after work-up - possible seizures?   Thyroid  disease 1990   hypothyroid   TIA (transient ischemic attack) 11/09/2020   Past Surgical History:  Procedure Laterality Date   APPENDECTOMY     CATARACT EXTRACTION W/PHACO Right 05/21/2019   Procedure: CATARACT EXTRACTION PHACO AND INTRAOCULAR LENS PLACEMENT (IOC) RIGHT panoptix lens  00:35.0  21.7%  7.61;  Surgeon: Mittie Gaskin, MD;  Location: [facility];  Service: Ophthalmology;  Laterality: Right;  sleep apnea requests early   CATARACT EXTRACTION W/PHACO Left 06/11/2019   Procedure: CATARACT EXTRACTION PHACO AND INTRAOCULAR LENS PLACEMENT (IOC) LEFT PANOPTIX TORIC LENS  00:50.0  20.3%  10.21;  Surgeon: Mittie Gaskin, MD;  Location: [facility];  Service: Ophthalmology;  Laterality: Left;   sleep apnea requests early   COLONOSCOPY     last 2012 - [facility]   ESOPHAGOGASTRODUODENOSCOPY     Multiple, last 01/07/2017 with Savary dilation to 18 mm   EYE SURGERY  August 2022   Cataracs removed   FRACTURE SURGERY  2025   HEMORRHOID BANDING  2018   [facility]   INTRAMEDULLARY (IM) NAIL INTERTROCHANTERIC Right 09/06/2023   Procedure: INTRAMEDULLARY (IM) NAIL INTERTROCHANTERIC;  Surgeon: Celena Sharper, MD;  Location: [facility];  Service: Orthopedics;  Laterality: Right;   LUMBAR DISC SURGERY  1984   L5    Patient Active Problem List   Diagnosis Date Noted   Vitamin D  deficiency 01/29/2024   Closed right hip fracture (HCC) 09/05/2023   Current use of proton pump inhibitor 03/13/2023   Pain in joint, lower leg 01/30/2023   Migraine    Essential hypertension    H/O: CVA (cerebrovascular accident) 11/08/2020   Colon cancer screening 02/05/2018   Degenerative joint disease (DJD) of lumbar spine 05/02/2017   Internal hemorrhoids 05/02/2017   Generalized osteoarthritis of hand 12/11/2016   GAD (generalized anxiety disorder) 12/11/2016   Estrogen deficiency 05/11/2016   Encounter for screening mammogram for breast cancer 05/11/2016   OSA (obstructive sleep apnea) 03/06/2016   Left knee pain 10/18/2015   Snoring 10/18/2015   Routine general  medical examination at a health care facility 02/28/2015   Hyperlipidemia 08/27/2012   Osteopenia 12/17/2009   BACK PAIN, LUMBAR 07/02/2009   IRRITABLE BOWEL SYNDROME 04/02/2009   Asymptomatic postmenopausal status 12/10/2008   Hypothyroidism 12/05/2007   Meniere's disease 12/05/2007   GERD 12/05/2007    PCP: Dr. Laine Balls  REFERRING PROVIDER: Dr. Ozell Bruch  REFERRING DIAG: R hip fracture  THERAPY DIAG:  Abnormality of gait and mobility  Muscle weakness (generalized)  Difficulty in walking, not elsewhere classified  Other abnormalities of gait and mobility  Pain in right hip  Other lack of coordination  Rationale for  Evaluation and Treatment: Rehabilitation  ONSET DATE: 09/06/2023  SUBJECTIVE:   SUBJECTIVE STATEMENT: From Today: Similar pain as last week- sometimes in my back and sometimes Superior ant Thigh pain and sometimes on the outside of my hips. I stopped the ultrasound treatments until I see Dr. Bruch next week.    From Eval:  Patient reports feeling better overall and states her MD wants her back in PT to work her out and strengthen her hip and work on balance. She denies any recent falls and reports her hip is feeling better since she was in PT back in June.   PERTINENT HISTORY: Sarah Harper is a 83 year old female with past medical history significant for HTN, hypothyroidism, GERD, osteopenia, dyslipidemia, OSA who presented to [facility] ED on 1/15 fall mechanical fall at home sustaining injury to her right hip. Patient underwent intramedullary nailing of the right hip 09/06/2023 by Dr. Bruch. Patient was receiving outpatient PT in May however was admitted back to hospital with Hyponatremia and acute metabolic encephalopathy. She has continued to be followed since release from hospital by Dr. Bruch for ongoing hip pain and received a bone stimulator/ultrasound device to help with non-healing hip and -Ultrasound/bone stimulator treatments 2 sets of 6 week treatment. Has competed 80/90 treatemnt and has f/u with Dr. Bruch on 10/29.  PAIN:  Are you having pain? Yes: NPRS scale: 2/10  Pain location: Right posterior superior gluteal Pain description: aggravating achy- constant Aggravating factors: Worse with ultrasound treatment Relieving factors: lying flat  PRECAUTIONS: Other: WBAT RLE and no restrictions per MD order  RED FLAGS: None   WEIGHT BEARING RESTRICTIONS: R LE WBAT  FALLS:  Has patient fallen in last 6 months? No  LIVING ENVIRONMENT: Lives with: lives with their spouse Lives in: House/apartment Stairs: Yes: Internal: 2 steps; on right going up Has following equipment at home:  Vannie - 2 wheeled and Grab bars  OCCUPATION: Stage manager  PLOF: Independent  PATIENT GOALS: Hope to be able to walk on my own without a device and be able to drive independently  NEXT MD VISIT: 06/18/2024- Dr. Bruch  OBJECTIVE:   Note: Objective measures were completed at Evaluation unless otherwise noted.  DIAGNOSTIC FINDINGS:  ADDENDUM REPORT: 03/05/2024 08:07   ADDENDUM: The original report was by Dr. Ryan Salvage. The following addendum is by Dr. Ryan Salvage:   With respect to further specifics on the intertrochanteric fracture, the original fracture as shown on the radiographs from 09/05/2023 does include a small comminuted avulsion of the lesser trochanter, with a component measuring 2.4 by 1.0 by 0.6 cm, shown for example on image 63 series 5 and image 102 series 2. It is conceivable that the lucency described as likely hyperemic could have been a donor site for this fragment, although the fragment is about 1.9 cm posterior to this lucency, and the natural action of the iliopsoas  would tend to pull the fragment anteriorly and cephalad rather than posteriorly.   With respect to infection as a cause for the lucency, I am skeptical given the lack of abnormal lucency extending along the hardware, although if the patient is experiencing fever and signs of infection then close monitoring would be recommended.     Electronically Signed   By: Ryan Salvage M.D.   On: 03/05/2024 08:07    Addended by Salvage Sawyers, MD on 03/05/2024  8:09 AM    Study Result  Narrative & Impression  CLINICAL DATA:  Right SI joint pain. Prior right hip intertrochanteric fracture ORIF in January 2025.   EXAM: CT PELVIS WITHOUT CONTRAST   TECHNIQUE: Multidetector CT imaging of the pelvis was performed following the standard protocol without intravenous contrast.   RADIATION DOSE REDUCTION: This exam was performed according to the departmental  dose-optimization program which includes automated exposure control, adjustment of the mA and/or kV according to patient size and/or use of iterative reconstruction technique.   COMPARISON:  Multiple exams, including radiographs 09/05/2023   FINDINGS: OSSEOUS STRUCTURES AND JOINTS:   Lower lumbar degenerative facet arthropathy. No SI joint diastasis. A small amount of nitrogen gas phenomenon in both SI joints without SI joint effusion observed. No erosions or other specific abnormalities along the SI joints aside from mild spurring.   No pelvic fracture observed.   Right femoral nail system in place with single distal interlocking screw and femoral head screw noted. This traverses the intertrochanteric fracture which is still visible, with notable lucency along part of the medial fracture margin on image 52 series 5 and image 99 series 2 probably from hyperemic resorption of bone in this local vicinity. There is potentially some early bridging of bone anteriorly at the fracture site. Early cortication along the posterior portion of the fracture may suggest early nonunion of the posterior portion for example as on image 62 series 5.   No abnormal lucency around the components of the nail system to indicate loosening or infection.   MUSCULOTENDINOUS:   Unremarkable   SACRAL PLEXUS:   No impinging lesion along the sacral plexus or proximal sciatic nerves.   OTHER:   Prominent stool throughout the colon favors constipation. Mild degenerative arthropathy of the left (contralateral) hip.   IMPRESSION: 1. No specific abnormality is identified to explain the patient's right SI joint pain. 2. Right femoral nail system in place traversing the intertrochanteric fracture. Questionable partial early nonunion of the posterior portion of the fracture anterior bridging is appreciated. There is some accentuated lucency along the medial portion of the fracture probably from hyperemic  resorption of bone, but no abnormal extension of lucency along components of the IM nail or femoral head nail to suggest loosening/infection. 3. Prominent stool throughout the colon favors constipation. 4. Mild degenerative arthropathy of the left (contralateral) hip. 5. Lower lumbar degenerative facet arthropathy.   Electronically Signed: By: Ryan Salvage M.D. On: 01/15/2024 16:54     PATIENT SURVEYS:  ABC scale: The Activities-Specific Balance Confidence (ABC) Scale 0% 10 20 30  40 50 60 70 80 90 100% No confidence<->completely confident  How confident are you that you will not lose your balance or become unsteady when you . . .   Date tested 06/10/2024  Walk around the house 80%  2. Walk up or down stairs 20%  3. Bend over and pick up a slipper from in front of a closet floor 30%  4. Reach for a small can off  a shelf at eye level 80%  5. Stand on tip toes and reach for something above your head 20%  6. Stand on a chair and reach for something 0%  7. Sweep the floor 60%  8. Walk outside the house to a car parked in the driveway 50%  9. Get into or out of a car 50%  10. Walk across a parking lot to the mall 10%  11. Walk up or down a ramp 50%  12. Walk in a crowded mall where people rapidly walk past you 10%  13. Are bumped into by people as you walk through the mall 10%  14. Step onto or off of an escalator while you are holding onto the railing 20%  15. Step onto or off an escalator while holding onto parcels such that you cannot hold onto the railing 10%  16. Walk outside on icy sidewalks 0%  Total: #/16 26.25%     COGNITION: Overall cognitive status: Within functional limits for tasks assessed     SENSATION: WFL   POSTURE: rounded shoulders and forward head  PALPATION: (+) tenderness (lateral hip and posteior right off edge of R sided sacrum)   LOWER EXTREMITY ROM:  Active ROM Right eval Left eval  Hip flexion    Hip extension    Hip abduction     Hip adduction    Hip internal rotation    Hip external rotation    Knee flexion    Knee extension    Ankle dorsiflexion    Ankle plantarflexion    Ankle inversion    Ankle eversion     (Blank rows = not tested)  LOWER EXTREMITY MMT:  MMT Right eval Left eval  Hip flexion 4 4+  Hip extension    Hip abduction 4 4+  Hip adduction    Hip internal rotation 4 4+  Hip external rotation 4 4+  Knee flexion 4 4  Knee extension 4 4  Ankle dorsiflexion 4+ 4+  Ankle plantarflexion    Ankle inversion    Ankle eversion     (Blank rows = not tested)  LOWER EXTREMITY SPECIAL TESTS:  None  FUNCTIONAL TESTS:  30 seconds chair stand test= Unable to stand without UE support today but did perform 10 reps with min UE support Timed up and go (TUG): 15.10 and 15.33=15.2 sec with RW 6 minute walk test: to be assessed next visit. 10 meter walk test: 12.10 and 11.69 sec =0.85 m/s avg with RW  Berg Balance Scale:  Item Test date: 06/10/2024 Date:  Date:   Sitting to standing 3. able to stand independently using hands Insert SmartPhrase OPRCBERGREEVAL Insert SmartPhrase OPRCBERGREEVAL  2. Standing unsupported 4. able to stand safely for 2 minutes    3. Sitting with back unsupported, feet supported 4. able to sit safely and securely for 2 minutes    4. Standing to sitting 3. controls descent by using hands    5. Pivot transfer  3. able to transfer safely with definite need of hands    6. Standing unsupported with eyes closed 4. able to stand 10 seconds safely    7. Standing unsupported with feet together 4. able to place feet together independently and stand 1 minute safely    8. Reaching forward with outstretched arms while standing 4. can reach forward confidently 25 cm (10 inches)    9. Pick up object from the floor from standing 3. able to pick up slipper but needs supervision  10. Turning to look behind over left and right shoulders while standing 2. turns sideways but only maintains  balance    11. Turn 360 degrees 2. able to turn 360 degrees safely but slowly    12. Place alternate foot on step or stool while standing unsupported 3. able to stand independently and complete 8 steps in > 20 seconds     13. Standing unsupported one foot in front 2. able to take small step independently and hold 30 seconds    14. Standing on one leg 1. tries to lift leg unable to hold 3 seconds but remains standing independently.      Total Score 42/56 Total Score:    Total Score:      GAIT: Distance walked: 150 feet Assistive device utilized: Walker - 2 wheeled Level of assistance: CGA Comments: Mild antalgia noted on right and decreased step length - short reciprocal steps with use of RW                                                                                                                                TREATMENT DATE: 07/30/24    Unless otherwise stated, CGA was provided and gait belt donned in order to ensure pt safety   THEREX: Standing hip hike-  12 reps each side Seated hip IR - Ball squeeze with feet out wide- 3#AW x 12  Seated hip ER- Ball squeeze at ankles and knees out wide 3# x 12  Therapeutic Activities:  -Resistive gait approx 500 feet with 3lb AW  -Sit to stand 2 sets of 10 reps (from raised mat table approx 24 in)  -Fwd lunge squat  onto 4 step 3 # x 10 reps. (I can feel it in my glutes but it is not painful)   -lateral Steps- up/down with minimal BUE Support -no pain reported x 12 reps each side. 3# AW  -Fwd step up onto 4 step with 3# AW x 15 reps      NMR:  -Tri- step (ant/lateral/post) onto 1/2 spike ball (SLS on stance LE) w/o UE support x 12 reps each LE with 3#           PATIENT EDUCATION:  Education details: rationale for 6 min walk and instruction in HEP Person educated: Patient and Spouse Education method: Explanation Education comprehension: verbalized understanding  HOME EXERCISE PROGRAM: Access Code: [email or web address] URL:  [email or web address] Date: 06/16/2024 Prepared by: Reyes London  Exercises - Single Leg Stance  - 3 x weekly - 5-10 sets - up to 20 sec hold - Standing Hip Flexion March  - 3 x weekly - 3 sets - 10 reps - 2 sec hold - Tandem Stance  - 3 x weekly - 5 sets - 30 sec  hold - Side Stepping with Resistance at Ankles  - 3 x weekly - 3 sets - 10 reps - Sit to Stand with Arms Crossed  - 3 x weekly -  3 sets - 10 reps  ASSESSMENT:  CLINICAL IMPRESSION: Patient presents with slight increase in reported R Hip soreness today so carefully monitored and patient performed well with all activities - most soreness after prolonged walk. Overall still progressing despite ongoing soreness. Pt will continue to benefit from skilled physical therapy intervention to address impairments, improve QOL, and attain therapy goals.    OBJECTIVE IMPAIRMENTS: Abnormal gait, cardiopulmonary status limiting activity, decreased activity tolerance, decreased balance, decreased coordination, decreased endurance, decreased mobility, difficulty walking, decreased strength, and pain.   ACTIVITY LIMITATIONS: carrying, lifting, bending, sitting, standing, squatting, stairs, and transfers  PARTICIPATION LIMITATIONS: meal prep, cleaning, laundry, driving, shopping, community activity, and yard work  PERSONAL FACTORS: 1-2 comorbidities: HTN, Arthritis are also affecting patient's functional outcome.   REHAB POTENTIAL: Good  CLINICAL DECISION MAKING: Stable/uncomplicated  EVALUATION COMPLEXITY: Low   GOALS: Goals reviewed with patient? Yes  SHORT TERM GOALS: Target date: 07/22/2024 Pt will be independent with HEP in order to improve strength and balance in order to decrease fall risk and improve function at home. Baseline: EVAL- Patient reports has not been performing any former HEP since her hospitalization in June; 07/16/2024= HEP are going pretty well, no pain and compliant as best I can.  Goal status:  MET   LONG TERM GOALS: Target date: 09/02/2024  Patient will report < 4/10 Right hip pain at worst with all functional activities.  Baseline: EVAL= worst = 7/10; 07/16/2024= 0/10 current but up to 6/10 at worst Goal status: PROGRESS  2.  Patient will complete >12 reps without use of UE support in 30 second sit to stand test indicating an increased LE strength and improved balance. Baseline: EVAL: Unable to stand without UE support today but did perform 10 reps with min UE support Goal status: INITIAL  3.  Patient will improve ABC score by 15 points to demonstrate statistically significant improvement in mobility and quality of life as it relates to their confidence in their balance.  Baseline: EVAL: 26.25 Goal status: INITIAL   4.  Patient will increase Berg Balance score by > 6 points to demonstrate decreased fall risk during functional activities. Baseline: EVAL= 42/56 Goal status: INITIAL   5.   Patient will reduce timed up and go to <11 seconds to reduce fall risk and demonstrate improved transfer/gait ability. Baseline: EVAL= 15.2 sec with RW Goal status: INITIAL  6.   Patient will increase 10 meter walk test to >1.65m/s as to improve gait speed for better community ambulation and to reduce fall risk. Baseline: EVAL= 0.85 m/s with RW Goal status: INITIAL  7.   Patient will increase six minute walk test distance to >1000 for progression to community ambulator and improve gait ability Baseline: Eval= To be completed at visit #2; 06/16/2024= 805 feet with RW Goal status: INITIAL    PLAN:  PT FREQUENCY: 1-2x/week  PT DURATION: 12 weeks  PLANNED INTERVENTIONS: [id number]- PT Re-evaluation, 97750- Physical Performance Testing, 97110-Therapeutic exercises, 97530- Therapeutic activity, [id number]- Neuromuscular re-education, 97535- Self Care, [id number]- Manual therapy, [id number]- Gait training, [id number]- Orthotic Initial, [id number]- Orthotic/Prosthetic subsequent, [phone]- Canalith repositioning, [id number]-  Electrical stimulation (manual), [phone] (1-2 muscles), [id number] (3+ muscles)- Dry Needling, Patient/Family education, Balance training, Stair training, Taping, Joint mobilization, Joint manipulation, Spinal mobilization, Vestibular training, DME instructions, Cryotherapy, and Moist heat  PLAN FOR NEXT SESSION:  R hip strengthening Gait training with cane Balance training Add to HEP as appropriate.    Reyes LOISE London, PT 08/04/2024, 3:23 PM

## 2024-08-06 ENCOUNTER — Ambulatory Visit

## 2024-08-06 ENCOUNTER — Telehealth: Payer: Self-pay | Admitting: Physician Assistant

## 2024-08-06 DIAGNOSIS — R2689 Other abnormalities of gait and mobility: Secondary | ICD-10-CM

## 2024-08-06 DIAGNOSIS — R269 Unspecified abnormalities of gait and mobility: Secondary | ICD-10-CM

## 2024-08-06 DIAGNOSIS — M25551 Pain in right hip: Secondary | ICD-10-CM

## 2024-08-06 DIAGNOSIS — M6281 Muscle weakness (generalized): Secondary | ICD-10-CM

## 2024-08-06 DIAGNOSIS — R278 Other lack of coordination: Secondary | ICD-10-CM

## 2024-08-06 DIAGNOSIS — R262 Difficulty in walking, not elsewhere classified: Secondary | ICD-10-CM

## 2024-08-06 NOTE — Telephone Encounter (Signed)
 Per Sarah Harper pt need to r/s to diff Monday ot Tuesday osteoporsis day

## 2024-08-06 NOTE — Therapy (Signed)
 OUTPATIENT PHYSICAL THERAPY LOWER EXTREMITY TREATMENT   Patient Name: Sarah Harper MRN: [id number] DOB:Oct 06, 1940, 83 y.o., female Today's Date: 08/06/2024  END OF SESSION:  PT End of Session - 08/06/24 1019     Visit Number 16    Number of Visits 24    Date for Recertification  09/03/23    Progress Note Due on Visit 20    PT Start Time 1015    Equipment Utilized During Treatment Gait belt    Activity Tolerance Patient tolerated treatment well    Behavior During Therapy [facility] for tasks assessed/performed               Past Medical History:  Diagnosis Date   Anxiety 2022   Arthritis    Blood loss anemia 09/13/2023   Cataract 1997   Colon cancer screening 02/05/2018   DDD (degenerative disc disease)    chronic back pain   ETD (eustachian tube dysfunction)    GERD (gastroesophageal reflux disease)    Hypertension 2022   IBS (irritable bowel syndrome)    Meniere's disease    Migraine    still gets visual aura from time to time   Mild cognitive impairment    Osteoporosis    Sleep apnea 2017   CPAP   Stroke (HCC) 2022   Syncopal episodes    after work-up - possible seizures?   Thyroid  disease 1990   hypothyroid   TIA (transient ischemic attack) 11/09/2020   Past Surgical History:  Procedure Laterality Date   APPENDECTOMY     CATARACT EXTRACTION W/PHACO Right 05/21/2019   Procedure: CATARACT EXTRACTION PHACO AND INTRAOCULAR LENS PLACEMENT (IOC) RIGHT panoptix lens  00:35.0  21.7%  7.61;  Surgeon: Mittie Gaskin, MD;  Location: [facility];  Service: Ophthalmology;  Laterality: Right;  sleep apnea requests early   CATARACT EXTRACTION W/PHACO Left 06/11/2019   Procedure: CATARACT EXTRACTION PHACO AND INTRAOCULAR LENS PLACEMENT (IOC) LEFT PANOPTIX TORIC LENS  00:50.0  20.3%  10.21;  Surgeon: Mittie Gaskin, MD;  Location: [facility];  Service: Ophthalmology;  Laterality: Left;  sleep apnea requests early   COLONOSCOPY     last  2012 - [facility]   ESOPHAGOGASTRODUODENOSCOPY     Multiple, last 01/07/2017 with Savary dilation to 18 mm   EYE SURGERY  August 2022   Cataracs removed   FRACTURE SURGERY  2025   HEMORRHOID BANDING  2018   [facility]   INTRAMEDULLARY (IM) NAIL INTERTROCHANTERIC Right 09/06/2023   Procedure: INTRAMEDULLARY (IM) NAIL INTERTROCHANTERIC;  Surgeon: Celena Sharper, MD;  Location: [facility];  Service: Orthopedics;  Laterality: Right;   LUMBAR DISC SURGERY  1984   L5    Patient Active Problem List   Diagnosis Date Noted   Vitamin D  deficiency 01/29/2024   Closed right hip fracture (HCC) 09/05/2023   Current use of proton pump inhibitor 03/13/2023   Pain in joint, lower leg 01/30/2023   Migraine    Essential hypertension    H/O: CVA (cerebrovascular accident) 11/08/2020   Colon cancer screening 02/05/2018   Degenerative joint disease (DJD) of lumbar spine 05/02/2017   Internal hemorrhoids 05/02/2017   Generalized osteoarthritis of hand 12/11/2016   GAD (generalized anxiety disorder) 12/11/2016   Estrogen deficiency 05/11/2016   Encounter for screening mammogram for breast cancer 05/11/2016   OSA (obstructive sleep apnea) 03/06/2016   Left knee pain 10/18/2015   Snoring 10/18/2015   Routine general medical examination at a health care facility 02/28/2015   Hyperlipidemia 08/27/2012   Osteopenia  12/17/2009   BACK PAIN, LUMBAR 07/02/2009   IRRITABLE BOWEL SYNDROME 04/02/2009   Asymptomatic postmenopausal status 12/10/2008   Hypothyroidism 12/05/2007   Meniere's disease 12/05/2007   GERD 12/05/2007    PCP: Dr. Laine Balls  REFERRING PROVIDER: Dr. Ozell Bruch  REFERRING DIAG: R hip fracture  THERAPY DIAG:  Abnormality of gait and mobility  Muscle weakness (generalized)  Difficulty in walking, not elsewhere classified  Other abnormalities of gait and mobility  Pain in right hip  Other lack of coordination  Rationale for Evaluation and Treatment: Rehabilitation  ONSET DATE:  09/06/2023  SUBJECTIVE:   SUBJECTIVE STATEMENT: From Today: Patient reports she feels the colder weather makes her more sore.  From Eval:  Patient reports feeling better overall and states her MD wants her back in PT to work her out and strengthen her hip and work on balance. She denies any recent falls and reports her hip is feeling better since she was in PT back in June.   PERTINENT HISTORY: Sarah Harper is a 83 year old female with past medical history significant for HTN, hypothyroidism, GERD, osteopenia, dyslipidemia, OSA who presented to [facility] ED on 1/15 fall mechanical fall at home sustaining injury to her right hip. Patient underwent intramedullary nailing of the right hip 09/06/2023 by Dr. Bruch. Patient was receiving outpatient PT in May however was admitted back to hospital with Hyponatremia and acute metabolic encephalopathy. She has continued to be followed since release from hospital by Dr. Bruch for ongoing hip pain and received a bone stimulator/ultrasound device to help with non-healing hip and -Ultrasound/bone stimulator treatments 2 sets of 6 week treatment. Has competed 80/90 treatemnt and has f/u with Dr. Bruch on 10/29.  PAIN:  Are you having pain? Yes: NPRS scale: 2/10  Pain location: Right posterior superior gluteal Pain description: aggravating achy- constant Aggravating factors: Worse with ultrasound treatment Relieving factors: lying flat  PRECAUTIONS: Other: WBAT RLE and no restrictions per MD order  RED FLAGS: None   WEIGHT BEARING RESTRICTIONS: R LE WBAT  FALLS:  Has patient fallen in last 6 months? No  LIVING ENVIRONMENT: Lives with: lives with their spouse Lives in: House/apartment Stairs: Yes: Internal: 2 steps; on right going up Has following equipment at home: Vannie - 2 wheeled and Grab bars  OCCUPATION: Stage manager  PLOF: Independent  PATIENT GOALS: Hope to be able to walk on my own without a device and be able to drive  independently  NEXT MD VISIT: 06/18/2024- Dr. Bruch  OBJECTIVE:   Note: Objective measures were completed at Evaluation unless otherwise noted.  DIAGNOSTIC FINDINGS:  ADDENDUM REPORT: 03/05/2024 08:07   ADDENDUM: The original report was by Dr. Ryan Salvage. The following addendum is by Dr. Ryan Salvage:   With respect to further specifics on the intertrochanteric fracture, the original fracture as shown on the radiographs from 09/05/2023 does include a small comminuted avulsion of the lesser trochanter, with a component measuring 2.4 by 1.0 by 0.6 cm, shown for example on image 63 series 5 and image 102 series 2. It is conceivable that the lucency described as likely hyperemic could have been a donor site for this fragment, although the fragment is about 1.9 cm posterior to this lucency, and the natural action of the iliopsoas would tend to pull the fragment anteriorly and cephalad rather than posteriorly.   With respect to infection as a cause for the lucency, I am skeptical given the lack of abnormal lucency extending along the hardware, although if the patient  is experiencing fever and signs of infection then close monitoring would be recommended.     Electronically Signed   By: Ryan Salvage M.D.   On: 03/05/2024 08:07    Addended by Salvage Sawyers, MD on 03/05/2024  8:09 AM    Study Result  Narrative & Impression  CLINICAL DATA:  Right SI joint pain. Prior right hip intertrochanteric fracture ORIF in January 2025.   EXAM: CT PELVIS WITHOUT CONTRAST   TECHNIQUE: Multidetector CT imaging of the pelvis was performed following the standard protocol without intravenous contrast.   RADIATION DOSE REDUCTION: This exam was performed according to the departmental dose-optimization program which includes automated exposure control, adjustment of the mA and/or kV according to patient size and/or use of iterative reconstruction technique.   COMPARISON:   Multiple exams, including radiographs 09/05/2023   FINDINGS: OSSEOUS STRUCTURES AND JOINTS:   Lower lumbar degenerative facet arthropathy. No SI joint diastasis. A small amount of nitrogen gas phenomenon in both SI joints without SI joint effusion observed. No erosions or other specific abnormalities along the SI joints aside from mild spurring.   No pelvic fracture observed.   Right femoral nail system in place with single distal interlocking screw and femoral head screw noted. This traverses the intertrochanteric fracture which is still visible, with notable lucency along part of the medial fracture margin on image 52 series 5 and image 99 series 2 probably from hyperemic resorption of bone in this local vicinity. There is potentially some early bridging of bone anteriorly at the fracture site. Early cortication along the posterior portion of the fracture may suggest early nonunion of the posterior portion for example as on image 62 series 5.   No abnormal lucency around the components of the nail system to indicate loosening or infection.   MUSCULOTENDINOUS:   Unremarkable   SACRAL PLEXUS:   No impinging lesion along the sacral plexus or proximal sciatic nerves.   OTHER:   Prominent stool throughout the colon favors constipation. Mild degenerative arthropathy of the left (contralateral) hip.   IMPRESSION: 1. No specific abnormality is identified to explain the patient's right SI joint pain. 2. Right femoral nail system in place traversing the intertrochanteric fracture. Questionable partial early nonunion of the posterior portion of the fracture anterior bridging is appreciated. There is some accentuated lucency along the medial portion of the fracture probably from hyperemic resorption of bone, but no abnormal extension of lucency along components of the IM nail or femoral head nail to suggest loosening/infection. 3. Prominent stool throughout the colon favors  constipation. 4. Mild degenerative arthropathy of the left (contralateral) hip. 5. Lower lumbar degenerative facet arthropathy.   Electronically Signed: By: Ryan Salvage M.D. On: 01/15/2024 16:54     PATIENT SURVEYS:  ABC scale: The Activities-Specific Balance Confidence (ABC) Scale 0% 10 20 30  40 50 60 70 80 90 100% No confidence<->completely confident  How confident are you that you will not lose your balance or become unsteady when you . . .   Date tested 06/10/2024  Walk around the house 80%  2. Walk up or down stairs 20%  3. Bend over and pick up a slipper from in front of a closet floor 30%  4. Reach for a small can off a shelf at eye level 80%  5. Stand on tip toes and reach for something above your head 20%  6. Stand on a chair and reach for something 0%  7. Sweep the floor 60%  8. Walk outside  the house to a car parked in the driveway 50%  9. Get into or out of a car 50%  10. Walk across a parking lot to the mall 10%  11. Walk up or down a ramp 50%  12. Walk in a crowded mall where people rapidly walk past you 10%  13. Are bumped into by people as you walk through the mall 10%  14. Step onto or off of an escalator while you are holding onto the railing 20%  15. Step onto or off an escalator while holding onto parcels such that you cannot hold onto the railing 10%  16. Walk outside on icy sidewalks 0%  Total: #/16 26.25%     COGNITION: Overall cognitive status: Within functional limits for tasks assessed     SENSATION: WFL   POSTURE: rounded shoulders and forward head  PALPATION: (+) tenderness (lateral hip and posteior right off edge of R sided sacrum)   LOWER EXTREMITY ROM:  Active ROM Right eval Left eval  Hip flexion    Hip extension    Hip abduction    Hip adduction    Hip internal rotation    Hip external rotation    Knee flexion    Knee extension    Ankle dorsiflexion    Ankle plantarflexion    Ankle inversion    Ankle eversion      (Blank rows = not tested)  LOWER EXTREMITY MMT:  MMT Right eval Left eval  Hip flexion 4 4+  Hip extension    Hip abduction 4 4+  Hip adduction    Hip internal rotation 4 4+  Hip external rotation 4 4+  Knee flexion 4 4  Knee extension 4 4  Ankle dorsiflexion 4+ 4+  Ankle plantarflexion    Ankle inversion    Ankle eversion     (Blank rows = not tested)  LOWER EXTREMITY SPECIAL TESTS:  None  FUNCTIONAL TESTS:  30 seconds chair stand test= Unable to stand without UE support today but did perform 10 reps with min UE support Timed up and go (TUG): 15.10 and 15.33=15.2 sec with RW 6 minute walk test: to be assessed next visit. 10 meter walk test: 12.10 and 11.69 sec =0.85 m/s avg with RW  Berg Balance Scale:  Item Test date: 06/10/2024 Date:  Date:   Sitting to standing 3. able to stand independently using hands Insert SmartPhrase OPRCBERGREEVAL Insert SmartPhrase OPRCBERGREEVAL  2. Standing unsupported 4. able to stand safely for 2 minutes    3. Sitting with back unsupported, feet supported 4. able to sit safely and securely for 2 minutes    4. Standing to sitting 3. controls descent by using hands    5. Pivot transfer  3. able to transfer safely with definite need of hands    6. Standing unsupported with eyes closed 4. able to stand 10 seconds safely    7. Standing unsupported with feet together 4. able to place feet together independently and stand 1 minute safely    8. Reaching forward with outstretched arms while standing 4. can reach forward confidently 25 cm (10 inches)    9. Pick up object from the floor from standing 3. able to pick up slipper but needs supervision    10. Turning to look behind over left and right shoulders while standing 2. turns sideways but only maintains balance    11. Turn 360 degrees 2. able to turn 360 degrees safely but slowly    12. Place  alternate foot on step or stool while standing unsupported 3. able to stand independently and complete 8  steps in > 20 seconds     13. Standing unsupported one foot in front 2. able to take small step independently and hold 30 seconds    14. Standing on one leg 1. tries to lift leg unable to hold 3 seconds but remains standing independently.      Total Score 42/56 Total Score:    Total Score:      GAIT: Distance walked: 150 feet Assistive device utilized: Walker - 2 wheeled Level of assistance: CGA Comments: Mild antalgia noted on right and decreased step length - short reciprocal steps with use of RW                                                                                                                                TREATMENT DATE: 08/06/24    Unless otherwise stated, CGA was provided and gait belt donned in order to ensure pt safety   THEREX: Seated hip march 2.5# 2 x 10 reps    Therapeutic Activities:  -Functional reaching activity- side step at support bar and reach diagonally overhead x 12 reps each side. Then 12 more reps each side with side step +minisquat and reach toward wall below waist height. -Resistive gait approx 500 feet with 2.5 lb AW using a multi point cane- I can feel it in my hip.- Reciprocal steps without any significant antalgia noted/observed.  -Monster walk- RTB around ankles using SPC x 20 feet x 2 -Sit to stand 2 sets of 10 reps (from raised mat table approx 24 in)  -Fwd lunge squat  onto 4 step 3 # x 10 reps. (I can feel it in my glutes but it is not painful)     NMR:  -Tri- step (ant/lateral/post) onto tape lines (SLS on stance LE) w  UE support  on walker x 12 reps each LE with RTB  Fwd step over obstacles (4 of 1/2 foam rolls) - open area without AD or any railing- Close CGA - left to right then back right to left x 5  Side step over obstacles (4 of 1/2 foam rolls) - open area without AD or any railing- Close CGA - left to right then back right to left x 5           PATIENT EDUCATION:  Education details: rationale for 6 min walk  and instruction in HEP Person educated: Patient and Spouse Education method: Explanation Education comprehension: verbalized understanding  HOME EXERCISE PROGRAM: Access Code: [email or web address] URL: [email or web address] Date: 06/16/2024 Prepared by: Reyes London  Exercises - Single Leg Stance  - 3 x weekly - 5-10 sets - up to 20 sec hold - Standing Hip Flexion March  - 3 x weekly - 3 sets - 10 reps - 2 sec hold - Tandem Stance  - 3 x weekly - 5 sets - 30 sec  hold - Side Stepping with Resistance at Ankles  - 3 x weekly - 3 sets - 10 reps - Sit to Stand with Arms Crossed  - 3 x weekly - 3 sets - 10 reps  ASSESSMENT:  CLINICAL IMPRESSION: Continued plan of care focusing on progressing overall BLE strength and confidence with weight bearing- dynamic stepping. Patient did gain some confidence performing more activities without use of support (either cane or railing) and will benefit from continued training to promote more stability.  Pt will continue to benefit from skilled physical therapy intervention to address impairments, improve QOL, and attain therapy goals.    OBJECTIVE IMPAIRMENTS: Abnormal gait, cardiopulmonary status limiting activity, decreased activity tolerance, decreased balance, decreased coordination, decreased endurance, decreased mobility, difficulty walking, decreased strength, and pain.   ACTIVITY LIMITATIONS: carrying, lifting, bending, sitting, standing, squatting, stairs, and transfers  PARTICIPATION LIMITATIONS: meal prep, cleaning, laundry, driving, shopping, community activity, and yard work  PERSONAL FACTORS: 1-2 comorbidities: HTN, Arthritis are also affecting patient's functional outcome.   REHAB POTENTIAL: Good  CLINICAL DECISION MAKING: Stable/uncomplicated  EVALUATION COMPLEXITY: Low   GOALS: Goals reviewed with patient? Yes  SHORT TERM GOALS: Target date: 07/22/2024 Pt will be independent with HEP in order to improve strength and  balance in order to decrease fall risk and improve function at home. Baseline: EVAL- Patient reports has not been performing any former HEP since her hospitalization in June; 07/16/2024= HEP are going pretty well, no pain and compliant as best I can.  Goal status: MET   LONG TERM GOALS: Target date: 09/02/2024  Patient will report < 4/10 Right hip pain at worst with all functional activities.  Baseline: EVAL= worst = 7/10; 07/16/2024= 0/10 current but up to 6/10 at worst Goal status: PROGRESS  2.  Patient will complete >12 reps without use of UE support in 30 second sit to stand test indicating an increased LE strength and improved balance. Baseline: EVAL: Unable to stand without UE support today but did perform 10 reps with min UE support Goal status: INITIAL  3.  Patient will improve ABC score by 15 points to demonstrate statistically significant improvement in mobility and quality of life as it relates to their confidence in their balance.  Baseline: EVAL: 26.25 Goal status: INITIAL   4.  Patient will increase Berg Balance score by > 6 points to demonstrate decreased fall risk during functional activities. Baseline: EVAL= 42/56 Goal status: INITIAL   5.   Patient will reduce timed up and go to <11 seconds to reduce fall risk and demonstrate improved transfer/gait ability. Baseline: EVAL= 15.2 sec with RW Goal status: INITIAL  6.   Patient will increase 10 meter walk test to >1.31m/s as to improve gait speed for better community ambulation and to reduce fall risk. Baseline: EVAL= 0.85 m/s with RW Goal status: INITIAL  7.   Patient will increase six minute walk test distance to >1000 for progression to community ambulator and improve gait ability Baseline: Eval= To be completed at visit #2; 06/16/2024= 805 feet with RW Goal status: INITIAL    PLAN:  PT FREQUENCY: 1-2x/week  PT DURATION: 12 weeks  PLANNED INTERVENTIONS: [id number]- PT Re-evaluation, 97750- Physical Performance  Testing, 97110-Therapeutic exercises, 97530- Therapeutic activity, [id number]- Neuromuscular re-education, 97535- Self Care, [id number]- Manual therapy, [id number]- Gait training, [phone]- Orthotic Initial, [id number]- Orthotic/Prosthetic subsequent, [phone]- Canalith repositioning, [id number]- Electrical stimulation (manual), [phone] (1-2 muscles), [id number] (3+ muscles)- Dry Needling, Patient/Family education, Balance training, Stair training, Taping, Joint mobilization, Joint manipulation, Spinal mobilization,  Vestibular training, DME instructions, Cryotherapy, and Moist heat  PLAN FOR NEXT SESSION:  R hip strengthening Gait training with cane Balance training Add to HEP as appropriate.    Reyes LOISE London, PT 08/06/2024, 1:52 PM

## 2024-08-11 ENCOUNTER — Ambulatory Visit

## 2024-08-13 ENCOUNTER — Ambulatory Visit

## 2024-08-13 DIAGNOSIS — R269 Unspecified abnormalities of gait and mobility: Secondary | ICD-10-CM

## 2024-08-13 DIAGNOSIS — R2689 Other abnormalities of gait and mobility: Secondary | ICD-10-CM

## 2024-08-13 DIAGNOSIS — M6281 Muscle weakness (generalized): Secondary | ICD-10-CM

## 2024-08-13 DIAGNOSIS — R278 Other lack of coordination: Secondary | ICD-10-CM

## 2024-08-13 DIAGNOSIS — R262 Difficulty in walking, not elsewhere classified: Secondary | ICD-10-CM

## 2024-08-13 DIAGNOSIS — M25551 Pain in right hip: Secondary | ICD-10-CM

## 2024-08-13 NOTE — Therapy (Signed)
 " OUTPATIENT PHYSICAL THERAPY LOWER EXTREMITY TREATMENT   Patient Name: Sarah Harper MRN: [id number] DOB:[birth year], 83 y.o., female Today's Date: 08/13/2024  END OF SESSION:  PT End of Session - 08/13/24 0956     Visit Number 17    Number of Visits 24    Date for Recertification  09/03/23    Progress Note Due on Visit 20    PT Start Time 0955    PT Stop Time 1039    PT Time Calculation (min) 44 min    Equipment Utilized During Treatment Gait belt    Activity Tolerance Patient tolerated treatment well    Behavior During Therapy [facility] for tasks assessed/performed               Past Medical History:  Diagnosis Date   Anxiety 2022   Arthritis    Blood loss anemia 09/13/2023   Cataract 1997   Colon cancer screening 02/05/2018   DDD (degenerative disc disease)    chronic back pain   ETD (eustachian tube dysfunction)    GERD (gastroesophageal reflux disease)    Hypertension 2022   IBS (irritable bowel syndrome)    Meniere's disease    Migraine    still gets visual aura from time to time   Mild cognitive impairment    Osteoporosis    Sleep apnea 2017   CPAP   Stroke (HCC) 2022   Syncopal episodes    after work-up - possible seizures?   Thyroid  disease 1990   hypothyroid   TIA (transient ischemic attack) 11/09/2020   Past Surgical History:  Procedure Laterality Date   APPENDECTOMY     CATARACT EXTRACTION W/PHACO Right 05/21/2019   Procedure: CATARACT EXTRACTION PHACO AND INTRAOCULAR LENS PLACEMENT (IOC) RIGHT panoptix lens  00:35.0  21.7%  7.61;  Surgeon: Mittie Gaskin, MD;  Location: [facility];  Service: Ophthalmology;  Laterality: Right;  sleep apnea requests early   CATARACT EXTRACTION W/PHACO Left 06/11/2019   Procedure: CATARACT EXTRACTION PHACO AND INTRAOCULAR LENS PLACEMENT (IOC) LEFT PANOPTIX TORIC LENS  00:50.0  20.3%  10.21;  Surgeon: Mittie Gaskin, MD;  Location: [facility];  Service: Ophthalmology;  Laterality:  Left;  sleep apnea requests early   COLONOSCOPY     last 2012 - [facility]   ESOPHAGOGASTRODUODENOSCOPY     Multiple, last 01/07/2017 with Savary dilation to 18 mm   EYE SURGERY  August 2022   Cataracs removed   FRACTURE SURGERY  2025   HEMORRHOID BANDING  2018   [facility]   INTRAMEDULLARY (IM) NAIL INTERTROCHANTERIC Right 09/06/2023   Procedure: INTRAMEDULLARY (IM) NAIL INTERTROCHANTERIC;  Surgeon: Celena Sharper, MD;  Location: [facility] OR;  Service: Orthopedics;  Laterality: Right;   LUMBAR DISC SURGERY  1984   L5    Patient Active Problem List   Diagnosis Date Noted   Vitamin D  deficiency 01/29/2024   Closed right hip fracture (HCC) 09/05/2023   Current use of proton pump inhibitor 03/13/2023   Pain in joint, lower leg 01/30/2023   Migraine    Essential hypertension    H/O: CVA (cerebrovascular accident) 11/08/2020   Colon cancer screening 02/05/2018   Degenerative joint disease (DJD) of lumbar spine 05/02/2017   Internal hemorrhoids 05/02/2017   Generalized osteoarthritis of hand 12/11/2016   GAD (generalized anxiety disorder) 12/11/2016   Estrogen deficiency 05/11/2016   Encounter for screening mammogram for breast cancer 05/11/2016   OSA (obstructive sleep apnea) 03/06/2016   Left knee pain 10/18/2015   Snoring 10/18/2015  Routine general medical examination at a health care facility 02/28/2015   Hyperlipidemia 08/27/2012   Osteopenia 12/17/2009   BACK PAIN, LUMBAR 07/02/2009   IRRITABLE BOWEL SYNDROME 04/02/2009   Asymptomatic postmenopausal status 12/10/2008   Hypothyroidism 12/05/2007   Meniere's disease 12/05/2007   GERD 12/05/2007    PCP: Dr. Laine Balls  REFERRING PROVIDER: Dr. Ozell Bruch  REFERRING DIAG: R hip fracture  THERAPY DIAG:  Abnormality of gait and mobility  Muscle weakness (generalized)  Difficulty in walking, not elsewhere classified  Other abnormalities of gait and mobility  Pain in right hip  Other lack of coordination  Rationale for  Evaluation and Treatment: Rehabilitation  ONSET DATE: 09/06/2023  SUBJECTIVE:   SUBJECTIVE STATEMENT: From Today: Patient MD said her bone are healing slow and asked her to return in 4 months. Patient reports balance and pain remain her biggest issues.   From Eval:  Patient reports feeling better overall and states her MD wants her back in PT to work her out and strengthen her hip and work on balance. She denies any recent falls and reports her hip is feeling better since she was in PT back in June.   PERTINENT HISTORY: Sarah Harper is a 83 year old female with past medical history significant for HTN, hypothyroidism, GERD, osteopenia, dyslipidemia, OSA who presented to [facility] ED on 1/15 fall mechanical fall at home sustaining injury to her right hip. Patient underwent intramedullary nailing of the right hip 09/06/2023 by Dr. Bruch. Patient was receiving outpatient PT in May however was admitted back to hospital with Hyponatremia and acute metabolic encephalopathy. She has continued to be followed since release from hospital by Dr. Bruch for ongoing hip pain and received a bone stimulator/ultrasound device to help with non-healing hip and -Ultrasound/bone stimulator treatments 2 sets of 6 week treatment. Has competed 80/90 treatemnt and has f/u with Dr. Bruch on 10/29.  PAIN:  Are you having pain? Yes: NPRS scale: 2/10  Pain location: Right posterior superior gluteal Pain description: aggravating achy- constant Aggravating factors: Worse with ultrasound treatment Relieving factors: lying flat  PRECAUTIONS: Other: WBAT RLE and no restrictions per MD order  RED FLAGS: None   WEIGHT BEARING RESTRICTIONS: R LE WBAT  FALLS:  Has patient fallen in last 6 months? No  LIVING ENVIRONMENT: Lives with: lives with their spouse Lives in: House/apartment Stairs: Yes: Internal: 2 steps; on right going up Has following equipment at home: Vannie - 2 wheeled and Grab bars  OCCUPATION: Presenter, broadcasting  PLOF: Independent  PATIENT GOALS: Hope to be able to walk on my own without a device and be able to drive independently  NEXT MD VISIT: 06/18/2024- Dr. Bruch  OBJECTIVE:   Note: Objective measures were completed at Evaluation unless otherwise noted.  DIAGNOSTIC FINDINGS:  ADDENDUM REPORT: 03/05/2024 08:07   ADDENDUM: The original report was by Dr. Ryan Salvage. The following addendum is by Dr. Ryan Salvage:   With respect to further specifics on the intertrochanteric fracture, the original fracture as shown on the radiographs from 09/05/2023 does include a small comminuted avulsion of the lesser trochanter, with a component measuring 2.4 by 1.0 by 0.6 cm, shown for example on image 63 series 5 and image 102 series 2. It is conceivable that the lucency described as likely hyperemic could have been a donor site for this fragment, although the fragment is about 1.9 cm posterior to this lucency, and the natural action of the iliopsoas would tend to pull the fragment anteriorly and cephalad  rather than posteriorly.   With respect to infection as a cause for the lucency, I am skeptical given the lack of abnormal lucency extending along the hardware, although if the patient is experiencing fever and signs of infection then close monitoring would be recommended.     Electronically Signed   By: Ryan Salvage M.D.   On: 03/05/2024 08:07    Addended by Salvage Sawyers, MD on 03/05/2024  8:09 AM    Study Result  Narrative & Impression  CLINICAL DATA:  Right SI joint pain. Prior right hip intertrochanteric fracture ORIF in January 2025.   EXAM: CT PELVIS WITHOUT CONTRAST   TECHNIQUE: Multidetector CT imaging of the pelvis was performed following the standard protocol without intravenous contrast.   RADIATION DOSE REDUCTION: This exam was performed according to the departmental dose-optimization program which includes automated exposure control,  adjustment of the mA and/or kV according to patient size and/or use of iterative reconstruction technique.   COMPARISON:  Multiple exams, including radiographs 09/05/2023   FINDINGS: OSSEOUS STRUCTURES AND JOINTS:   Lower lumbar degenerative facet arthropathy. No SI joint diastasis. A small amount of nitrogen gas phenomenon in both SI joints without SI joint effusion observed. No erosions or other specific abnormalities along the SI joints aside from mild spurring.   No pelvic fracture observed.   Right femoral nail system in place with single distal interlocking screw and femoral head screw noted. This traverses the intertrochanteric fracture which is still visible, with notable lucency along part of the medial fracture margin on image 52 series 5 and image 99 series 2 probably from hyperemic resorption of bone in this local vicinity. There is potentially some early bridging of bone anteriorly at the fracture site. Early cortication along the posterior portion of the fracture may suggest early nonunion of the posterior portion for example as on image 62 series 5.   No abnormal lucency around the components of the nail system to indicate loosening or infection.   MUSCULOTENDINOUS:   Unremarkable   SACRAL PLEXUS:   No impinging lesion along the sacral plexus or proximal sciatic nerves.   OTHER:   Prominent stool throughout the colon favors constipation. Mild degenerative arthropathy of the left (contralateral) hip.   IMPRESSION: 1. No specific abnormality is identified to explain the patient's right SI joint pain. 2. Right femoral nail system in place traversing the intertrochanteric fracture. Questionable partial early nonunion of the posterior portion of the fracture anterior bridging is appreciated. There is some accentuated lucency along the medial portion of the fracture probably from hyperemic resorption of bone, but no abnormal extension of lucency along  components of the IM nail or femoral head nail to suggest loosening/infection. 3. Prominent stool throughout the colon favors constipation. 4. Mild degenerative arthropathy of the left (contralateral) hip. 5. Lower lumbar degenerative facet arthropathy.   Electronically Signed: By: Ryan Salvage M.D. On: 01/15/2024 16:54     PATIENT SURVEYS:  ABC scale: The Activities-Specific Balance Confidence (ABC) Scale 0% 10 20 30  40 50 60 70 80 90 100% No confidence<->completely confident  How confident are you that you will not lose your balance or become unsteady when you . . .   Date tested 06/10/2024  Walk around the house 80%  2. Walk up or down stairs 20%  3. Bend over and pick up a slipper from in front of a closet floor 30%  4. Reach for a small can off a shelf at eye level 80%  5. Stand  on tip toes and reach for something above your head 20%  6. Stand on a chair and reach for something 0%  7. Sweep the floor 60%  8. Walk outside the house to a car parked in the driveway 50%  9. Get into or out of a car 50%  10. Walk across a parking lot to the mall 10%  11. Walk up or down a ramp 50%  12. Walk in a crowded mall where people rapidly walk past you 10%  13. Are bumped into by people as you walk through the mall 10%  14. Step onto or off of an escalator while you are holding onto the railing 20%  15. Step onto or off an escalator while holding onto parcels such that you cannot hold onto the railing 10%  16. Walk outside on icy sidewalks 0%  Total: #/16 26.25%     COGNITION: Overall cognitive status: Within functional limits for tasks assessed     SENSATION: WFL   POSTURE: rounded shoulders and forward head  PALPATION: (+) tenderness (lateral hip and posteior right off edge of R sided sacrum)   LOWER EXTREMITY ROM:  Active ROM Right eval Left eval  Hip flexion    Hip extension    Hip abduction    Hip adduction    Hip internal rotation    Hip external  rotation    Knee flexion    Knee extension    Ankle dorsiflexion    Ankle plantarflexion    Ankle inversion    Ankle eversion     (Blank rows = not tested)  LOWER EXTREMITY MMT:  MMT Right eval Left eval  Hip flexion 4 4+  Hip extension    Hip abduction 4 4+  Hip adduction    Hip internal rotation 4 4+  Hip external rotation 4 4+  Knee flexion 4 4  Knee extension 4 4  Ankle dorsiflexion 4+ 4+  Ankle plantarflexion    Ankle inversion    Ankle eversion     (Blank rows = not tested)  LOWER EXTREMITY SPECIAL TESTS:  None  FUNCTIONAL TESTS:  30 seconds chair stand test= Unable to stand without UE support today but did perform 10 reps with min UE support Timed up and go (TUG): 15.10 and 15.33=15.2 sec with RW 6 minute walk test: to be assessed next visit. 10 meter walk test: 12.10 and 11.69 sec =0.85 m/s avg with RW  Berg Balance Scale:  Item Test date: 06/10/2024 Date:  Date:   Sitting to standing 3. able to stand independently using hands Insert SmartPhrase OPRCBERGREEVAL Insert SmartPhrase OPRCBERGREEVAL  2. Standing unsupported 4. able to stand safely for 2 minutes    3. Sitting with back unsupported, feet supported 4. able to sit safely and securely for 2 minutes    4. Standing to sitting 3. controls descent by using hands    5. Pivot transfer  3. able to transfer safely with definite need of hands    6. Standing unsupported with eyes closed 4. able to stand 10 seconds safely    7. Standing unsupported with feet together 4. able to place feet together independently and stand 1 minute safely    8. Reaching forward with outstretched arms while standing 4. can reach forward confidently 25 cm (10 inches)    9. Pick up object from the floor from standing 3. able to pick up slipper but needs supervision    10. Turning to look behind over left and  right shoulders while standing 2. turns sideways but only maintains balance    11. Turn 360 degrees 2. able to turn 360 degrees  safely but slowly    12. Place alternate foot on step or stool while standing unsupported 3. able to stand independently and complete 8 steps in > 20 seconds     13. Standing unsupported one foot in front 2. able to take small step independently and hold 30 seconds    14. Standing on one leg 1. tries to lift leg unable to hold 3 seconds but remains standing independently.      Total Score 42/56 Total Score:    Total Score:      GAIT: Distance walked: 150 feet Assistive device utilized: Walker - 2 wheeled Level of assistance: CGA Comments: Mild antalgia noted on right and decreased step length - short reciprocal steps with use of RW                                                                                                                                TREATMENT DATE: 08/06/24    Unless otherwise stated, CGA was provided and gait belt donned in order to ensure pt safety   NMR:  Static standing at support  bar- EO x 30 sec x 3 Static standing at support bar - EC x 20 sec x 3 Arranging magnet letters into alphabetical order with varying foot poisitons: Feet narrowed, 1/2 staggered, 3/4 rhomberg, full rhomberg- all on firm surface then performed another rd of above activities on an airex pad.      PATIENT EDUCATION:  Education details: rationale for 6 min walk and instruction in HEP Person educated: Patient and Spouse Education method: Explanation Education comprehension: verbalized understanding  HOME EXERCISE PROGRAM: Access Code: [email or web address] URL: [email or web address] Date: 06/16/2024 Prepared by: Reyes London  Exercises - Single Leg Stance  - 3 x weekly - 5-10 sets - up to 20 sec hold - Standing Hip Flexion March  - 3 x weekly - 3 sets - 10 reps - 2 sec hold - Tandem Stance  - 3 x weekly - 5 sets - 30 sec  hold - Side Stepping with Resistance at Ankles  - 3 x weekly - 3 sets - 10 reps - Sit to Stand with Arms Crossed  - 3 x weekly - 3 sets - 10  reps  ASSESSMENT:  CLINICAL IMPRESSION: Treatment focused on standing balance incorporating varying feet position to challenge her ankle and later hip stability. She was fatigued with prolonged standing in progressively harder balance position. She required some rest but overall performed well- very challenged with dynamic standing. Pt will continue to benefit from skilled physical therapy intervention to address impairments, improve QOL, and attain therapy goals.    OBJECTIVE IMPAIRMENTS: Abnormal gait, cardiopulmonary status limiting activity, decreased activity tolerance, decreased balance, decreased coordination, decreased endurance, decreased mobility, difficulty walking, decreased strength, and pain.   ACTIVITY LIMITATIONS: carrying,  lifting, bending, sitting, standing, squatting, stairs, and transfers  PARTICIPATION LIMITATIONS: meal prep, cleaning, laundry, driving, shopping, community activity, and yard work  PERSONAL FACTORS: 1-2 comorbidities: HTN, Arthritis are also affecting patient's functional outcome.   REHAB POTENTIAL: Good  CLINICAL DECISION MAKING: Stable/uncomplicated  EVALUATION COMPLEXITY: Low   GOALS: Goals reviewed with patient? Yes  SHORT TERM GOALS: Target date: 07/22/2024 Pt will be independent with HEP in order to improve strength and balance in order to decrease fall risk and improve function at home. Baseline: EVAL- Patient reports has not been performing any former HEP since her hospitalization in June; 07/16/2024= HEP are going pretty well, no pain and compliant as best I can.  Goal status: MET   LONG TERM GOALS: Target date: 09/02/2024  Patient will report < 4/10 Right hip pain at worst with all functional activities.  Baseline: EVAL= worst = 7/10; 07/16/2024= 0/10 current but up to 6/10 at worst Goal status: PROGRESS  2.  Patient will complete >12 reps without use of UE support in 30 second sit to stand test indicating an increased LE strength and  improved balance. Baseline: EVAL: Unable to stand without UE support today but did perform 10 reps with min UE support Goal status: INITIAL  3.  Patient will improve ABC score by 15 points to demonstrate statistically significant improvement in mobility and quality of life as it relates to their confidence in their balance.  Baseline: EVAL: 26.25 Goal status: INITIAL   4.  Patient will increase Berg Balance score by > 6 points to demonstrate decreased fall risk during functional activities. Baseline: EVAL= 42/56 Goal status: INITIAL   5.   Patient will reduce timed up and go to <11 seconds to reduce fall risk and demonstrate improved transfer/gait ability. Baseline: EVAL= 15.2 sec with RW Goal status: INITIAL  6.   Patient will increase 10 meter walk test to >1.70m/s as to improve gait speed for better community ambulation and to reduce fall risk. Baseline: EVAL= 0.85 m/s with RW Goal status: INITIAL  7.   Patient will increase six minute walk test distance to >1000 for progression to community ambulator and improve gait ability Baseline: Eval= To be completed at visit #2; 06/16/2024= 805 feet with RW Goal status: INITIAL    PLAN:  PT FREQUENCY: 1-2x/week  PT DURATION: 12 weeks  PLANNED INTERVENTIONS: [id number]- PT Re-evaluation, 97750- Physical Performance Testing, 97110-Therapeutic exercises, 97530- Therapeutic activity, [id number]- Neuromuscular re-education, 97535- Self Care, [id number]- Manual therapy, [id number]- Gait training, [id number]- Orthotic Initial, [id number]- Orthotic/Prosthetic subsequent, [phone]- Canalith repositioning, [id number]- Electrical stimulation (manual), [phone] (1-2 muscles), [id number] (3+ muscles)- Dry Needling, Patient/Family education, Balance training, Stair training, Taping, Joint mobilization, Joint manipulation, Spinal mobilization, Vestibular training, DME instructions, Cryotherapy, and Moist heat  PLAN FOR NEXT SESSION:  R hip strengthening Gait training with cane Balance  training Add to HEP as appropriate.    Reyes LOISE London, PT 08/13/2024, 10:58 AM  "

## 2024-08-18 ENCOUNTER — Ambulatory Visit

## 2024-08-18 DIAGNOSIS — R278 Other lack of coordination: Secondary | ICD-10-CM

## 2024-08-18 DIAGNOSIS — R262 Difficulty in walking, not elsewhere classified: Secondary | ICD-10-CM

## 2024-08-18 DIAGNOSIS — R2689 Other abnormalities of gait and mobility: Secondary | ICD-10-CM

## 2024-08-18 DIAGNOSIS — R269 Unspecified abnormalities of gait and mobility: Secondary | ICD-10-CM | POA: Diagnosis not present

## 2024-08-18 DIAGNOSIS — M25551 Pain in right hip: Secondary | ICD-10-CM

## 2024-08-18 DIAGNOSIS — M6281 Muscle weakness (generalized): Secondary | ICD-10-CM

## 2024-08-18 NOTE — Therapy (Signed)
 " OUTPATIENT PHYSICAL THERAPY LOWER EXTREMITY TREATMENT   Patient Name: Sarah Harper MRN: [id number] DOB:1940/09/02, 83 y.o., female Today's Date: 08/18/2024  END OF SESSION:  PT End of Session - 08/18/24 1020     Visit Number 18    Number of Visits 24    Date for Recertification  09/03/23    Progress Note Due on Visit 20    PT Start Time 1016    PT Stop Time 1101    PT Time Calculation (min) 45 min    Equipment Utilized During Treatment Gait belt    Activity Tolerance Patient tolerated treatment well    Behavior During Therapy WFL for tasks assessed/performed                Past Medical History:  Diagnosis Date   Anxiety 2022   Arthritis    Blood loss anemia 09/13/2023   Cataract 1997   Colon cancer screening 02/05/2018   DDD (degenerative disc disease)    chronic back pain   ETD (eustachian tube dysfunction)    GERD (gastroesophageal reflux disease)    Hypertension 2022   IBS (irritable bowel syndrome)    Meniere's disease    Migraine    still gets visual aura from time to time   Mild cognitive impairment    Osteoporosis    Sleep apnea 2017   CPAP   Stroke (HCC) 2022   Syncopal episodes    after work-up - possible seizures?   Thyroid  disease 1990   hypothyroid   TIA (transient ischemic attack) 11/09/2020   Past Surgical History:  Procedure Laterality Date   APPENDECTOMY     CATARACT EXTRACTION W/PHACO Right 05/21/2019   Procedure: CATARACT EXTRACTION PHACO AND INTRAOCULAR LENS PLACEMENT (IOC) RIGHT panoptix lens  00:35.0  21.7%  7.61;  Surgeon: Mittie Gaskin, MD;  Location: [facility];  Service: Ophthalmology;  Laterality: Right;  sleep apnea requests early   CATARACT EXTRACTION W/PHACO Left 06/11/2019   Procedure: CATARACT EXTRACTION PHACO AND INTRAOCULAR LENS PLACEMENT (IOC) LEFT PANOPTIX TORIC LENS  00:50.0  20.3%  10.21;  Surgeon: Mittie Gaskin, MD;  Location: [facility];  Service: Ophthalmology;  Laterality:  Left;  sleep apnea requests early   COLONOSCOPY     last 2012 - [facility]   ESOPHAGOGASTRODUODENOSCOPY     Multiple, last 01/07/2017 with Savary dilation to 18 mm   EYE SURGERY  August 2022   Cataracs removed   FRACTURE SURGERY  2025   HEMORRHOID BANDING  2018   [facility]   INTRAMEDULLARY (IM) NAIL INTERTROCHANTERIC Right 09/06/2023   Procedure: INTRAMEDULLARY (IM) NAIL INTERTROCHANTERIC;  Surgeon: Celena Sharper, MD;  Location: [facility];  Service: Orthopedics;  Laterality: Right;   LUMBAR DISC SURGERY  1984   L5    Patient Active Problem List   Diagnosis Date Noted   Vitamin D  deficiency 01/29/2024   Closed right hip fracture (HCC) 09/05/2023   Current use of proton pump inhibitor 03/13/2023   Pain in joint, lower leg 01/30/2023   Migraine    Essential hypertension    H/O: CVA (cerebrovascular accident) 11/08/2020   Colon cancer screening 02/05/2018   Degenerative joint disease (DJD) of lumbar spine 05/02/2017   Internal hemorrhoids 05/02/2017   Generalized osteoarthritis of hand 12/11/2016   GAD (generalized anxiety disorder) 12/11/2016   Estrogen deficiency 05/11/2016   Encounter for screening mammogram for breast cancer 05/11/2016   OSA (obstructive sleep apnea) 03/06/2016   Left knee pain 10/18/2015   Snoring 10/18/2015  Routine general medical examination at a health care facility 02/28/2015   Hyperlipidemia 08/27/2012   Osteopenia 12/17/2009   BACK PAIN, LUMBAR 07/02/2009   IRRITABLE BOWEL SYNDROME 04/02/2009   Asymptomatic postmenopausal status 12/10/2008   Hypothyroidism 12/05/2007   Meniere's disease 12/05/2007   GERD 12/05/2007    PCP: Dr. Laine Balls  REFERRING PROVIDER: Dr. Ozell Bruch  REFERRING DIAG: R hip fracture  THERAPY DIAG:  Abnormality of gait and mobility  Muscle weakness (generalized)  Difficulty in walking, not elsewhere classified  Other abnormalities of gait and mobility  Pain in right hip  Other lack of coordination  Rationale for  Evaluation and Treatment: Rehabilitation  ONSET DATE: 09/06/2023  SUBJECTIVE:   SUBJECTIVE STATEMENT: From Today: Patient reports no updates since last visit- states had a quiet Christmas and didn't really go anywhere    From Eval:  Patient reports feeling better overall and states her MD wants her back in PT to work her out and strengthen her hip and work on balance. She denies any recent falls and reports her hip is feeling better since she was in PT back in June.   PERTINENT HISTORY: Sarah Harper is a 83 year old female with past medical history significant for HTN, hypothyroidism, GERD, osteopenia, dyslipidemia, OSA who presented to [facility] ED on 1/15 fall mechanical fall at home sustaining injury to her right hip. Patient underwent intramedullary nailing of the right hip 09/06/2023 by Dr. Bruch. Patient was receiving outpatient PT in May however was admitted back to hospital with Hyponatremia and acute metabolic encephalopathy. She has continued to be followed since release from hospital by Dr. Bruch for ongoing hip pain and received a bone stimulator/ultrasound device to help with non-healing hip and -Ultrasound/bone stimulator treatments 2 sets of 6 week treatment. Has competed 80/90 treatemnt and has f/u with Dr. Bruch on 10/29.  PAIN:  Are you having pain? Yes: NPRS scale: 2/10  Pain location: Right posterior superior gluteal Pain description: aggravating achy- constant Aggravating factors: Worse with ultrasound treatment Relieving factors: lying flat  PRECAUTIONS: Other: WBAT RLE and no restrictions per MD order  RED FLAGS: None   WEIGHT BEARING RESTRICTIONS: R LE WBAT  FALLS:  Has patient fallen in last 6 months? No  LIVING ENVIRONMENT: Lives with: lives with their spouse Lives in: House/apartment Stairs: Yes: Internal: 2 steps; on right going up Has following equipment at home: Vannie - 2 wheeled and Grab bars  OCCUPATION: Stage manager  PLOF:  Independent  PATIENT GOALS: Hope to be able to walk on my own without a device and be able to drive independently  NEXT MD VISIT: 06/18/2024- Dr. Bruch  OBJECTIVE:   Note: Objective measures were completed at Evaluation unless otherwise noted.  DIAGNOSTIC FINDINGS:  ADDENDUM REPORT: 03/05/2024 08:07   ADDENDUM: The original report was by Dr. Ryan Salvage. The following addendum is by Dr. Ryan Salvage:   With respect to further specifics on the intertrochanteric fracture, the original fracture as shown on the radiographs from 09/05/2023 does include a small comminuted avulsion of the lesser trochanter, with a component measuring 2.4 by 1.0 by 0.6 cm, shown for example on image 63 series 5 and image 102 series 2. It is conceivable that the lucency described as likely hyperemic could have been a donor site for this fragment, although the fragment is about 1.9 cm posterior to this lucency, and the natural action of the iliopsoas would tend to pull the fragment anteriorly and cephalad rather than posteriorly.   With respect  to infection as a cause for the lucency, I am skeptical given the lack of abnormal lucency extending along the hardware, although if the patient is experiencing fever and signs of infection then close monitoring would be recommended.     Electronically Signed   By: Ryan Salvage M.D.   On: 03/05/2024 08:07    Addended by Salvage Sawyers, MD on 03/05/2024  8:09 AM    Study Result  Narrative & Impression  CLINICAL DATA:  Right SI joint pain. Prior right hip intertrochanteric fracture ORIF in January 2025.   EXAM: CT PELVIS WITHOUT CONTRAST   TECHNIQUE: Multidetector CT imaging of the pelvis was performed following the standard protocol without intravenous contrast.   RADIATION DOSE REDUCTION: This exam was performed according to the departmental dose-optimization program which includes automated exposure control, adjustment of the mA  and/or kV according to patient size and/or use of iterative reconstruction technique.   COMPARISON:  Multiple exams, including radiographs 09/05/2023   FINDINGS: OSSEOUS STRUCTURES AND JOINTS:   Lower lumbar degenerative facet arthropathy. No SI joint diastasis. A small amount of nitrogen gas phenomenon in both SI joints without SI joint effusion observed. No erosions or other specific abnormalities along the SI joints aside from mild spurring.   No pelvic fracture observed.   Right femoral nail system in place with single distal interlocking screw and femoral head screw noted. This traverses the intertrochanteric fracture which is still visible, with notable lucency along part of the medial fracture margin on image 52 series 5 and image 99 series 2 probably from hyperemic resorption of bone in this local vicinity. There is potentially some early bridging of bone anteriorly at the fracture site. Early cortication along the posterior portion of the fracture may suggest early nonunion of the posterior portion for example as on image 62 series 5.   No abnormal lucency around the components of the nail system to indicate loosening or infection.   MUSCULOTENDINOUS:   Unremarkable   SACRAL PLEXUS:   No impinging lesion along the sacral plexus or proximal sciatic nerves.   OTHER:   Prominent stool throughout the colon favors constipation. Mild degenerative arthropathy of the left (contralateral) hip.   IMPRESSION: 1. No specific abnormality is identified to explain the patient's right SI joint pain. 2. Right femoral nail system in place traversing the intertrochanteric fracture. Questionable partial early nonunion of the posterior portion of the fracture anterior bridging is appreciated. There is some accentuated lucency along the medial portion of the fracture probably from hyperemic resorption of bone, but no abnormal extension of lucency along components of the IM  nail or femoral head nail to suggest loosening/infection. 3. Prominent stool throughout the colon favors constipation. 4. Mild degenerative arthropathy of the left (contralateral) hip. 5. Lower lumbar degenerative facet arthropathy.   Electronically Signed: By: Ryan Salvage M.D. On: 01/15/2024 16:54     PATIENT SURVEYS:  ABC scale: The Activities-Specific Balance Confidence (ABC) Scale 0% 10 20 30  40 50 60 70 80 90 100% No confidence<->completely confident  How confident are you that you will not lose your balance or become unsteady when you . . .   Date tested 06/10/2024  Walk around the house 80%  2. Walk up or down stairs 20%  3. Bend over and pick up a slipper from in front of a closet floor 30%  4. Reach for a small can off a shelf at eye level 80%  5. Stand on tip toes and reach for something  above your head 20%  6. Stand on a chair and reach for something 0%  7. Sweep the floor 60%  8. Walk outside the house to a car parked in the driveway 50%  9. Get into or out of a car 50%  10. Walk across a parking lot to the mall 10%  11. Walk up or down a ramp 50%  12. Walk in a crowded mall where people rapidly walk past you 10%  13. Are bumped into by people as you walk through the mall 10%  14. Step onto or off of an escalator while you are holding onto the railing 20%  15. Step onto or off an escalator while holding onto parcels such that you cannot hold onto the railing 10%  16. Walk outside on icy sidewalks 0%  Total: #/16 26.25%     COGNITION: Overall cognitive status: Within functional limits for tasks assessed     SENSATION: WFL   POSTURE: rounded shoulders and forward head  PALPATION: (+) tenderness (lateral hip and posteior right off edge of R sided sacrum)   LOWER EXTREMITY ROM:  Active ROM Right eval Left eval  Hip flexion    Hip extension    Hip abduction    Hip adduction    Hip internal rotation    Hip external rotation    Knee flexion     Knee extension    Ankle dorsiflexion    Ankle plantarflexion    Ankle inversion    Ankle eversion     (Blank rows = not tested)  LOWER EXTREMITY MMT:  MMT Right eval Left eval  Hip flexion 4 4+  Hip extension    Hip abduction 4 4+  Hip adduction    Hip internal rotation 4 4+  Hip external rotation 4 4+  Knee flexion 4 4  Knee extension 4 4  Ankle dorsiflexion 4+ 4+  Ankle plantarflexion    Ankle inversion    Ankle eversion     (Blank rows = not tested)  LOWER EXTREMITY SPECIAL TESTS:  None  FUNCTIONAL TESTS:  30 seconds chair stand test= Unable to stand without UE support today but did perform 10 reps with min UE support Timed up and go (TUG): 15.10 and 15.33=15.2 sec with RW 6 minute walk test: to be assessed next visit. 10 meter walk test: 12.10 and 11.69 sec =0.85 m/s avg with RW  Berg Balance Scale:  Item Test date: 06/10/2024 Date:  Date:   Sitting to standing 3. able to stand independently using hands Insert SmartPhrase OPRCBERGREEVAL Insert SmartPhrase OPRCBERGREEVAL  2. Standing unsupported 4. able to stand safely for 2 minutes    3. Sitting with back unsupported, feet supported 4. able to sit safely and securely for 2 minutes    4. Standing to sitting 3. controls descent by using hands    5. Pivot transfer  3. able to transfer safely with definite need of hands    6. Standing unsupported with eyes closed 4. able to stand 10 seconds safely    7. Standing unsupported with feet together 4. able to place feet together independently and stand 1 minute safely    8. Reaching forward with outstretched arms while standing 4. can reach forward confidently 25 cm (10 inches)    9. Pick up object from the floor from standing 3. able to pick up slipper but needs supervision    10. Turning to look behind over left and right shoulders while standing 2. turns sideways  but only maintains balance    11. Turn 360 degrees 2. able to turn 360 degrees safely but slowly    12. Place  alternate foot on step or stool while standing unsupported 3. able to stand independently and complete 8 steps in > 20 seconds     13. Standing unsupported one foot in front 2. able to take small step independently and hold 30 seconds    14. Standing on one leg 1. tries to lift leg unable to hold 3 seconds but remains standing independently.      Total Score 42/56 Total Score:    Total Score:      GAIT: Distance walked: 150 feet Assistive device utilized: Walker - 2 wheeled Level of assistance: CGA Comments: Mild antalgia noted on right and decreased step length - short reciprocal steps with use of RW                                                                                                                                TREATMENT DATE: 08/18/24    Unless otherwise stated, CGA was provided and gait belt donned in order to ensure pt safety   NMR:  FWD Step over orange hurdle then up/onto purple pad then over orange hurdle in // bar- (down/back) using 3# AW x 8 Side Step over orange hurdle then up/onto purple pad then over orange hurdle in // bar- (down/back) using 3# AW x 8 Static stand on incline board - hold 30 sec x 3 Static stand on incline board with horizontal head turns x 10 Static stand on airex pad with feet staggered x 30 sec x 1 each side- Progressed to self ball toss x 10 reps Static stand on airex pad with feet narrowed and self ball toss x 20 reps Static stand on airex pad with dynamic thoracic rotation- reaching for ball on right side and then passing back to PT on left side Step up + (dynamic high knee march on opp LE)  with 1UE support x 10 each LE  Gait Training: Resistive gait 3# AW- using multipoint cane- 300 feet x 2 rounds. - Patient exhibited decreased step length but improved with VC      PATIENT EDUCATION:  Education details: rationale for 6 min walk and instruction in HEP Person educated: Patient and Spouse Education method: Explanation Education  comprehension: verbalized understanding  HOME EXERCISE PROGRAM: Access Code: [email or web address] URL: [email or web address] Date: 06/16/2024 Prepared by: Reyes London  Exercises - Single Leg Stance  - 3 x weekly - 5-10 sets - up to 20 sec hold - Standing Hip Flexion March  - 3 x weekly - 3 sets - 10 reps - 2 sec hold - Tandem Stance  - 3 x weekly - 5 sets - 30 sec  hold - Side Stepping with Resistance at Ankles  - 3 x weekly - 3 sets - 10 reps - Sit to Stand with Arms Crossed  -  3 x weekly - 3 sets - 10 reps  ASSESSMENT:  CLINICAL IMPRESSION: Patient participated very well overall today- engaged in all activities- accepting cues for safety and for improved gait. Feel like her walking is more habit regarding decreased step length. She is gaining some confidence with balance and able to bear weight well through RLE today without report of pain. Pt will continue to benefit from skilled physical therapy intervention to address impairments, improve QOL, and attain therapy goals.    OBJECTIVE IMPAIRMENTS: Abnormal gait, cardiopulmonary status limiting activity, decreased activity tolerance, decreased balance, decreased coordination, decreased endurance, decreased mobility, difficulty walking, decreased strength, and pain.   ACTIVITY LIMITATIONS: carrying, lifting, bending, sitting, standing, squatting, stairs, and transfers  PARTICIPATION LIMITATIONS: meal prep, cleaning, laundry, driving, shopping, community activity, and yard work  PERSONAL FACTORS: 1-2 comorbidities: HTN, Arthritis are also affecting patient's functional outcome.   REHAB POTENTIAL: Good  CLINICAL DECISION MAKING: Stable/uncomplicated  EVALUATION COMPLEXITY: Low   GOALS: Goals reviewed with patient? Yes  SHORT TERM GOALS: Target date: 07/22/2024 Pt will be independent with HEP in order to improve strength and balance in order to decrease fall risk and improve function at home. Baseline: EVAL- Patient  reports has not been performing any former HEP since her hospitalization in June; 07/16/2024= HEP are going pretty well, no pain and compliant as best I can.  Goal status: MET   LONG TERM GOALS: Target date: 09/02/2024  Patient will report < 4/10 Right hip pain at worst with all functional activities.  Baseline: EVAL= worst = 7/10; 07/16/2024= 0/10 current but up to 6/10 at worst Goal status: PROGRESS  2.  Patient will complete >12 reps without use of UE support in 30 second sit to stand test indicating an increased LE strength and improved balance. Baseline: EVAL: Unable to stand without UE support today but did perform 10 reps with min UE support Goal status: INITIAL  3.  Patient will improve ABC score by 15 points to demonstrate statistically significant improvement in mobility and quality of life as it relates to their confidence in their balance.  Baseline: EVAL: 26.25 Goal status: INITIAL   4.  Patient will increase Berg Balance score by > 6 points to demonstrate decreased fall risk during functional activities. Baseline: EVAL= 42/56 Goal status: INITIAL   5.   Patient will reduce timed up and go to <11 seconds to reduce fall risk and demonstrate improved transfer/gait ability. Baseline: EVAL= 15.2 sec with RW Goal status: INITIAL  6.   Patient will increase 10 meter walk test to >1.79m/s as to improve gait speed for better community ambulation and to reduce fall risk. Baseline: EVAL= 0.85 m/s with RW Goal status: INITIAL  7.   Patient will increase six minute walk test distance to >1000 for progression to community ambulator and improve gait ability Baseline: Eval= To be completed at visit #2; 06/16/2024= 805 feet with RW Goal status: INITIAL    PLAN:  PT FREQUENCY: 1-2x/week  PT DURATION: 12 weeks  PLANNED INTERVENTIONS: [id number]- PT Re-evaluation, 97750- Physical Performance Testing, 97110-Therapeutic exercises, 97530- Therapeutic activity, [id number]- Neuromuscular  re-education, 97535- Self Care, [id number]- Manual therapy, [id number]- Gait training, [id number]- Orthotic Initial, [id number]- Orthotic/Prosthetic subsequent, [phone]- Canalith repositioning, [id number]- Electrical stimulation (manual), [phone] (1-2 muscles), [id number] (3+ muscles)- Dry Needling, Patient/Family education, Balance training, Stair training, Taping, Joint mobilization, Joint manipulation, Spinal mobilization, Vestibular training, DME instructions, Cryotherapy, and Moist heat  PLAN FOR NEXT SESSION:  R hip strengthening Gait training with cane Balance training  Add to HEP as appropriate.    Reyes LOISE London, PT 08/18/2024, 11:09 AM  "

## 2024-08-19 NOTE — Therapy (Signed)
 " OUTPATIENT PHYSICAL THERAPY LOWER EXTREMITY TREATMENT   Patient Name: Sarah Harper MRN: [id number] DOB:1941-07-21, 83 y.o., female Today's Date: 08/20/2024  END OF SESSION:  PT End of Session - 08/20/24 1023     Visit Number 19    Number of Visits 24    Date for Recertification  09/03/23    Progress Note Due on Visit 20    PT Start Time 1015    PT Stop Time 1057    PT Time Calculation (min) 42 min    Equipment Utilized During Treatment Gait belt    Activity Tolerance Patient tolerated treatment well    Behavior During Therapy WFL for tasks assessed/performed                 Past Medical History:  Diagnosis Date   Anxiety 2022   Arthritis    Blood loss anemia 09/13/2023   Cataract 1997   Colon cancer screening 02/05/2018   DDD (degenerative disc disease)    chronic back pain   ETD (eustachian tube dysfunction)    GERD (gastroesophageal reflux disease)    Hypertension 2022   IBS (irritable bowel syndrome)    Meniere's disease    Migraine    still gets visual aura from time to time   Mild cognitive impairment    Osteoporosis    Sleep apnea 2017   CPAP   Stroke (HCC) 2022   Syncopal episodes    after work-up - possible seizures?   Thyroid  disease 1990   hypothyroid   TIA (transient ischemic attack) 11/09/2020   Past Surgical History:  Procedure Laterality Date   APPENDECTOMY     CATARACT EXTRACTION W/PHACO Right 05/21/2019   Procedure: CATARACT EXTRACTION PHACO AND INTRAOCULAR LENS PLACEMENT (IOC) RIGHT panoptix lens  00:35.0  21.7%  7.61;  Surgeon: Mittie Gaskin, MD;  Location: [facility];  Service: Ophthalmology;  Laterality: Right;  sleep apnea requests early   CATARACT EXTRACTION W/PHACO Left 06/11/2019   Procedure: CATARACT EXTRACTION PHACO AND INTRAOCULAR LENS PLACEMENT (IOC) LEFT PANOPTIX TORIC LENS  00:50.0  20.3%  10.21;  Surgeon: Mittie Gaskin, MD;  Location: [facility];  Service: Ophthalmology;  Laterality:  Left;  sleep apnea requests early   COLONOSCOPY     last 2012 - [facility]   ESOPHAGOGASTRODUODENOSCOPY     Multiple, last 01/07/2017 with Savary dilation to 18 mm   EYE SURGERY  August 2022   Cataracs removed   FRACTURE SURGERY  2025   HEMORRHOID BANDING  2018   [facility]   INTRAMEDULLARY (IM) NAIL INTERTROCHANTERIC Right 09/06/2023   Procedure: INTRAMEDULLARY (IM) NAIL INTERTROCHANTERIC;  Surgeon: Celena Sharper, MD;  Location: [facility];  Service: Orthopedics;  Laterality: Right;   LUMBAR DISC SURGERY  1984   L5    Patient Active Problem List   Diagnosis Date Noted   Vitamin D  deficiency 01/29/2024   Closed right hip fracture (HCC) 09/05/2023   Current use of proton pump inhibitor 03/13/2023   Pain in joint, lower leg 01/30/2023   Migraine    Essential hypertension    H/O: CVA (cerebrovascular accident) 11/08/2020   Colon cancer screening 02/05/2018   Degenerative joint disease (DJD) of lumbar spine 05/02/2017   Internal hemorrhoids 05/02/2017   Generalized osteoarthritis of hand 12/11/2016   GAD (generalized anxiety disorder) 12/11/2016   Estrogen deficiency 05/11/2016   Encounter for screening mammogram for breast cancer 05/11/2016   OSA (obstructive sleep apnea) 03/06/2016   Left knee pain 10/18/2015   Snoring 10/18/2015  Routine general medical examination at a health care facility 02/28/2015   Hyperlipidemia 08/27/2012   Osteopenia 12/17/2009   BACK PAIN, LUMBAR 07/02/2009   IRRITABLE BOWEL SYNDROME 04/02/2009   Asymptomatic postmenopausal status 12/10/2008   Hypothyroidism 12/05/2007   Meniere's disease 12/05/2007   GERD 12/05/2007    PCP: Dr. Laine Balls  REFERRING PROVIDER: Dr. Ozell Bruch  REFERRING DIAG: R hip fracture  THERAPY DIAG:  Abnormality of gait and mobility  Muscle weakness (generalized)  Difficulty in walking, not elsewhere classified  Other abnormalities of gait and mobility  Pain in right hip  Other lack of coordination  Rationale for  Evaluation and Treatment: Rehabilitation  ONSET DATE: 09/06/2023  SUBJECTIVE:   SUBJECTIVE STATEMENT: From Today: Patient reports mild soreness after last visit.   From Eval:  Patient reports feeling better overall and states her MD wants her back in PT to work her out and strengthen her hip and work on balance. She denies any recent falls and reports her hip is feeling better since she was in PT back in June.   PERTINENT HISTORY: Sarah Harper is a 83 year old female with past medical history significant for HTN, hypothyroidism, GERD, osteopenia, dyslipidemia, OSA who presented to [facility] ED on 1/15 fall mechanical fall at home sustaining injury to her right hip. Patient underwent intramedullary nailing of the right hip 09/06/2023 by Dr. Bruch. Patient was receiving outpatient PT in May however was admitted back to hospital with Hyponatremia and acute metabolic encephalopathy. She has continued to be followed since release from hospital by Dr. Bruch for ongoing hip pain and received a bone stimulator/ultrasound device to help with non-healing hip and -Ultrasound/bone stimulator treatments 2 sets of 6 week treatment. Has competed 80/90 treatemnt and has f/u with Dr. Bruch on 10/29.  PAIN:  Are you having pain? Yes: NPRS scale: 2/10  Pain location: Right posterior superior gluteal Pain description: aggravating achy- constant Aggravating factors: Worse with ultrasound treatment Relieving factors: lying flat  PRECAUTIONS: Other: WBAT RLE and no restrictions per MD order  RED FLAGS: None   WEIGHT BEARING RESTRICTIONS: R LE WBAT  FALLS:  Has patient fallen in last 6 months? No  LIVING ENVIRONMENT: Lives with: lives with their spouse Lives in: House/apartment Stairs: Yes: Internal: 2 steps; on right going up Has following equipment at home: Vannie - 2 wheeled and Grab bars  OCCUPATION: Stage manager  PLOF: Independent  PATIENT GOALS: Hope to be able to walk on my own without a  device and be able to drive independently  NEXT MD VISIT: 06/18/2024- Dr. Bruch  OBJECTIVE:   Note: Objective measures were completed at Evaluation unless otherwise noted.  DIAGNOSTIC FINDINGS:  ADDENDUM REPORT: 03/05/2024 08:07   ADDENDUM: The original report was by Dr. Ryan Salvage. The following addendum is by Dr. Ryan Salvage:   With respect to further specifics on the intertrochanteric fracture, the original fracture as shown on the radiographs from 09/05/2023 does include a small comminuted avulsion of the lesser trochanter, with a component measuring 2.4 by 1.0 by 0.6 cm, shown for example on image 63 series 5 and image 102 series 2. It is conceivable that the lucency described as likely hyperemic could have been a donor site for this fragment, although the fragment is about 1.9 cm posterior to this lucency, and the natural action of the iliopsoas would tend to pull the fragment anteriorly and cephalad rather than posteriorly.   With respect to infection as a cause for the lucency, I am skeptical  given the lack of abnormal lucency extending along the hardware, although if the patient is experiencing fever and signs of infection then close monitoring would be recommended.     Electronically Signed   By: Ryan Salvage M.D.   On: 03/05/2024 08:07    Addended by Salvage Sawyers, MD on 03/05/2024  8:09 AM    Study Result  Narrative & Impression  CLINICAL DATA:  Right SI joint pain. Prior right hip intertrochanteric fracture ORIF in January 2025.   EXAM: CT PELVIS WITHOUT CONTRAST   TECHNIQUE: Multidetector CT imaging of the pelvis was performed following the standard protocol without intravenous contrast.   RADIATION DOSE REDUCTION: This exam was performed according to the departmental dose-optimization program which includes automated exposure control, adjustment of the mA and/or kV according to patient size and/or use of iterative reconstruction  technique.   COMPARISON:  Multiple exams, including radiographs 09/05/2023   FINDINGS: OSSEOUS STRUCTURES AND JOINTS:   Lower lumbar degenerative facet arthropathy. No SI joint diastasis. A small amount of nitrogen gas phenomenon in both SI joints without SI joint effusion observed. No erosions or other specific abnormalities along the SI joints aside from mild spurring.   No pelvic fracture observed.   Right femoral nail system in place with single distal interlocking screw and femoral head screw noted. This traverses the intertrochanteric fracture which is still visible, with notable lucency along part of the medial fracture margin on image 52 series 5 and image 99 series 2 probably from hyperemic resorption of bone in this local vicinity. There is potentially some early bridging of bone anteriorly at the fracture site. Early cortication along the posterior portion of the fracture may suggest early nonunion of the posterior portion for example as on image 62 series 5.   No abnormal lucency around the components of the nail system to indicate loosening or infection.   MUSCULOTENDINOUS:   Unremarkable   SACRAL PLEXUS:   No impinging lesion along the sacral plexus or proximal sciatic nerves.   OTHER:   Prominent stool throughout the colon favors constipation. Mild degenerative arthropathy of the left (contralateral) hip.   IMPRESSION: 1. No specific abnormality is identified to explain the patient's right SI joint pain. 2. Right femoral nail system in place traversing the intertrochanteric fracture. Questionable partial early nonunion of the posterior portion of the fracture anterior bridging is appreciated. There is some accentuated lucency along the medial portion of the fracture probably from hyperemic resorption of bone, but no abnormal extension of lucency along components of the IM nail or femoral head nail to suggest loosening/infection. 3. Prominent stool  throughout the colon favors constipation. 4. Mild degenerative arthropathy of the left (contralateral) hip. 5. Lower lumbar degenerative facet arthropathy.   Electronically Signed: By: Ryan Salvage M.D. On: 01/15/2024 16:54     PATIENT SURVEYS:  ABC scale: The Activities-Specific Balance Confidence (ABC) Scale 0% 10 20 30  40 50 60 70 80 90 100% No confidence<->completely confident  How confident are you that you will not lose your balance or become unsteady when you . . .   Date tested 06/10/2024  Walk around the house 80%  2. Walk up or down stairs 20%  3. Bend over and pick up a slipper from in front of a closet floor 30%  4. Reach for a small can off a shelf at eye level 80%  5. Stand on tip toes and reach for something above your head 20%  6. Stand on a chair and  reach for something 0%  7. Sweep the floor 60%  8. Walk outside the house to a car parked in the driveway 50%  9. Get into or out of a car 50%  10. Walk across a parking lot to the mall 10%  11. Walk up or down a ramp 50%  12. Walk in a crowded mall where people rapidly walk past you 10%  13. Are bumped into by people as you walk through the mall 10%  14. Step onto or off of an escalator while you are holding onto the railing 20%  15. Step onto or off an escalator while holding onto parcels such that you cannot hold onto the railing 10%  16. Walk outside on icy sidewalks 0%  Total: #/16 26.25%     COGNITION: Overall cognitive status: Within functional limits for tasks assessed     SENSATION: WFL   POSTURE: rounded shoulders and forward head  PALPATION: (+) tenderness (lateral hip and posteior right off edge of R sided sacrum)   LOWER EXTREMITY ROM:  Active ROM Right eval Left eval  Hip flexion    Hip extension    Hip abduction    Hip adduction    Hip internal rotation    Hip external rotation    Knee flexion    Knee extension    Ankle dorsiflexion    Ankle plantarflexion    Ankle  inversion    Ankle eversion     (Blank rows = not tested)  LOWER EXTREMITY MMT:  MMT Right eval Left eval  Hip flexion 4 4+  Hip extension    Hip abduction 4 4+  Hip adduction    Hip internal rotation 4 4+  Hip external rotation 4 4+  Knee flexion 4 4  Knee extension 4 4  Ankle dorsiflexion 4+ 4+  Ankle plantarflexion    Ankle inversion    Ankle eversion     (Blank rows = not tested)  LOWER EXTREMITY SPECIAL TESTS:  None  FUNCTIONAL TESTS:  30 seconds chair stand test= Unable to stand without UE support today but did perform 10 reps with min UE support Timed up and go (TUG): 15.10 and 15.33=15.2 sec with RW 6 minute walk test: to be assessed next visit. 10 meter walk test: 12.10 and 11.69 sec =0.85 m/s avg with RW  Berg Balance Scale:  Item Test date: 06/10/2024 Date:  Date:   Sitting to standing 3. able to stand independently using hands Insert SmartPhrase OPRCBERGREEVAL Insert SmartPhrase OPRCBERGREEVAL  2. Standing unsupported 4. able to stand safely for 2 minutes    3. Sitting with back unsupported, feet supported 4. able to sit safely and securely for 2 minutes    4. Standing to sitting 3. controls descent by using hands    5. Pivot transfer  3. able to transfer safely with definite need of hands    6. Standing unsupported with eyes closed 4. able to stand 10 seconds safely    7. Standing unsupported with feet together 4. able to place feet together independently and stand 1 minute safely    8. Reaching forward with outstretched arms while standing 4. can reach forward confidently 25 cm (10 inches)    9. Pick up object from the floor from standing 3. able to pick up slipper but needs supervision    10. Turning to look behind over left and right shoulders while standing 2. turns sideways but only maintains balance    11. Turn 360 degrees  2. able to turn 360 degrees safely but slowly    12. Place alternate foot on step or stool while standing unsupported 3. able to  stand independently and complete 8 steps in > 20 seconds     13. Standing unsupported one foot in front 2. able to take small step independently and hold 30 seconds    14. Standing on one leg 1. tries to lift leg unable to hold 3 seconds but remains standing independently.      Total Score 42/56 Total Score:    Total Score:      GAIT: Distance walked: 150 feet Assistive device utilized: Walker - 2 wheeled Level of assistance: CGA Comments: Mild antalgia noted on right and decreased step length - short reciprocal steps with use of RW                                                                                                                                TREATMENT DATE: 08/20/24    Unless otherwise stated, CGA was provided and gait belt donned in order to ensure pt safety   TA:  Step up onto 4 step x 15 reps alt LE Lateral step up/down on 4 step x 15 each LE Sit to stand with Hip add on ball x 10 reps  Airplane (hip ext + ER) with 1 UE support x 12 reps each side. 6 min walk= 950 feet with SPC and 3# AW   TE: Standing ham curl 3# AW x 15 reps alt LE Standing hip ext 3# AW x 15 reps alt LE Standing donkey kick 3# AW x 12 reps alt LE Seated Hip IR with ball squeeze- 2 x 10 reps ea LE            PATIENT EDUCATION:  Education details: rationale for 6 min walk and instruction in HEP Person educated: Patient and Spouse Education method: Explanation Education comprehension: verbalized understanding  HOME EXERCISE PROGRAM: Access Code: [email or web address] URL: [email or web address] Date: 06/16/2024 Prepared by: Reyes London  Exercises - Single Leg Stance  - 3 x weekly - 5-10 sets - up to 20 sec hold - Standing Hip Flexion March  - 3 x weekly - 3 sets - 10 reps - 2 sec hold - Tandem Stance  - 3 x weekly - 5 sets - 30 sec  hold - Side Stepping with Resistance at Ankles  - 3 x weekly - 3 sets - 10 reps - Sit to Stand with Arms Crossed  - 3 x weekly - 3  sets - 10 reps  ASSESSMENT:  CLINICAL IMPRESSION: Patient continues to make progress- able to vastly improve with functional endurance- walking 150 more feet in same time during 6 min walk with lesser restrictive device and addition of resistance at ankles. She continues to present with some LE muscle weakness but progressing overall - able to perform more advanced hip stability activities.  Pt will continue to benefit from  skilled physical therapy intervention to address impairments, improve QOL, and attain therapy goals.    OBJECTIVE IMPAIRMENTS: Abnormal gait, cardiopulmonary status limiting activity, decreased activity tolerance, decreased balance, decreased coordination, decreased endurance, decreased mobility, difficulty walking, decreased strength, and pain.   ACTIVITY LIMITATIONS: carrying, lifting, bending, sitting, standing, squatting, stairs, and transfers  PARTICIPATION LIMITATIONS: meal prep, cleaning, laundry, driving, shopping, community activity, and yard work  PERSONAL FACTORS: 1-2 comorbidities: HTN, Arthritis are also affecting patient's functional outcome.   REHAB POTENTIAL: Good  CLINICAL DECISION MAKING: Stable/uncomplicated  EVALUATION COMPLEXITY: Low   GOALS: Goals reviewed with patient? Yes  SHORT TERM GOALS: Target date: 07/22/2024 Pt will be independent with HEP in order to improve strength and balance in order to decrease fall risk and improve function at home. Baseline: EVAL- Patient reports has not been performing any former HEP since her hospitalization in June; 07/16/2024= HEP are going pretty well, no pain and compliant as best I can.  Goal status: MET   LONG TERM GOALS: Target date: 09/02/2024  Patient will report < 4/10 Right hip pain at worst with all functional activities.  Baseline: EVAL= worst = 7/10; 07/16/2024= 0/10 current but up to 6/10 at worst Goal status: PROGRESS  2.  Patient will complete >12 reps without use of UE support in 30  second sit to stand test indicating an increased LE strength and improved balance. Baseline: EVAL: Unable to stand without UE support today but did perform 10 reps with min UE support Goal status: INITIAL  3.  Patient will improve ABC score by 15 points to demonstrate statistically significant improvement in mobility and quality of life as it relates to their confidence in their balance.  Baseline: EVAL: 26.25 Goal status: INITIAL   4.  Patient will increase Berg Balance score by > 6 points to demonstrate decreased fall risk during functional activities. Baseline: EVAL= 42/56 Goal status: INITIAL   5.   Patient will reduce timed up and go to <11 seconds to reduce fall risk and demonstrate improved transfer/gait ability. Baseline: EVAL= 15.2 sec with RW Goal status: INITIAL  6.   Patient will increase 10 meter walk test to >1.30m/s as to improve gait speed for better community ambulation and to reduce fall risk. Baseline: EVAL= 0.85 m/s with RW Goal status: INITIAL  7.   Patient will increase six minute walk test distance to >1000 for progression to community ambulator and improve gait ability Baseline: Eval= To be completed at visit #2; 06/16/2024= 805 feet with RW; 08/20/24= 950 feet with Cane and 3#AW on each LE Goal status: INITIAL    PLAN:  PT FREQUENCY: 1-2x/week  PT DURATION: 12 weeks  PLANNED INTERVENTIONS: [id number]- PT Re-evaluation, 97750- Physical Performance Testing, 97110-Therapeutic exercises, 97530- Therapeutic activity, [id number]- Neuromuscular re-education, 97535- Self Care, [id number]- Manual therapy, [id number]- Gait training, [id number]- Orthotic Initial, [id number]- Orthotic/Prosthetic subsequent, [phone]- Canalith repositioning, [id number]- Electrical stimulation (manual), [phone] (1-2 muscles), [id number] (3+ muscles)- Dry Needling, Patient/Family education, Balance training, Stair training, Taping, Joint mobilization, Joint manipulation, Spinal mobilization, Vestibular training, DME instructions,  Cryotherapy, and Moist heat  PLAN FOR NEXT SESSION:  R hip strengthening Gait training with cane Balance training Add to HEP as appropriate.    Reyes LOISE London, PT 08/20/2024, 10:59 AM  "

## 2024-08-20 ENCOUNTER — Ambulatory Visit

## 2024-08-20 DIAGNOSIS — R269 Unspecified abnormalities of gait and mobility: Secondary | ICD-10-CM

## 2024-08-20 DIAGNOSIS — R2689 Other abnormalities of gait and mobility: Secondary | ICD-10-CM

## 2024-08-20 DIAGNOSIS — R278 Other lack of coordination: Secondary | ICD-10-CM

## 2024-08-20 DIAGNOSIS — M6281 Muscle weakness (generalized): Secondary | ICD-10-CM

## 2024-08-20 DIAGNOSIS — R262 Difficulty in walking, not elsewhere classified: Secondary | ICD-10-CM

## 2024-08-20 DIAGNOSIS — M25551 Pain in right hip: Secondary | ICD-10-CM

## 2024-08-25 ENCOUNTER — Ambulatory Visit: Attending: Orthopedic Surgery

## 2024-08-25 DIAGNOSIS — M25551 Pain in right hip: Secondary | ICD-10-CM | POA: Insufficient documentation

## 2024-08-25 DIAGNOSIS — M6281 Muscle weakness (generalized): Secondary | ICD-10-CM | POA: Insufficient documentation

## 2024-08-25 DIAGNOSIS — R269 Unspecified abnormalities of gait and mobility: Secondary | ICD-10-CM | POA: Insufficient documentation

## 2024-08-25 DIAGNOSIS — R278 Other lack of coordination: Secondary | ICD-10-CM | POA: Insufficient documentation

## 2024-08-25 DIAGNOSIS — R2689 Other abnormalities of gait and mobility: Secondary | ICD-10-CM | POA: Insufficient documentation

## 2024-08-25 DIAGNOSIS — R262 Difficulty in walking, not elsewhere classified: Secondary | ICD-10-CM | POA: Insufficient documentation

## 2024-08-25 NOTE — Therapy (Signed)
 " OUTPATIENT PHYSICAL THERAPY LOWER EXTREMITY TREATMENT/Physical Therapy Progress Note   Dates of reporting period  07/16/2024   to   08/25/2024    Patient Name: Sarah Harper MRN: [id number] DOB:08-20-1941, 84 y.o., female Today's Date: 08/26/2024  END OF SESSION:  PT End of Session - 08/25/24 1006     Visit Number 20    Number of Visits 24    Date for Recertification  09/03/23    Progress Note Due on Visit 20    PT Start Time 1008    PT Stop Time 1056    PT Time Calculation (min) 48 min    Equipment Utilized During Treatment Gait belt    Activity Tolerance Patient tolerated treatment well    Behavior During Therapy WFL for tasks assessed/performed                 Past Medical History:  Diagnosis Date   Anxiety 2022   Arthritis    Blood loss anemia 09/13/2023   Cataract 1997   Colon cancer screening 02/05/2018   DDD (degenerative disc disease)    chronic back pain   ETD (eustachian tube dysfunction)    GERD (gastroesophageal reflux disease)    Hypertension 2022   IBS (irritable bowel syndrome)    Meniere's disease    Migraine    still gets visual aura from time to time   Mild cognitive impairment    Osteoporosis    Sleep apnea 2017   CPAP   Stroke (HCC) 2022   Syncopal episodes    after work-up - possible seizures?   Thyroid  disease 1990   hypothyroid   TIA (transient ischemic attack) 11/09/2020   Past Surgical History:  Procedure Laterality Date   APPENDECTOMY     CATARACT EXTRACTION W/PHACO Right 05/21/2019   Procedure: CATARACT EXTRACTION PHACO AND INTRAOCULAR LENS PLACEMENT (IOC) RIGHT panoptix lens  00:35.0  21.7%  7.61;  Surgeon: Mittie Gaskin, MD;  Location: [facility];  Service: Ophthalmology;  Laterality: Right;  sleep apnea requests early   CATARACT EXTRACTION W/PHACO Left 06/11/2019   Procedure: CATARACT EXTRACTION PHACO AND INTRAOCULAR LENS PLACEMENT (IOC) LEFT PANOPTIX TORIC LENS  00:50.0  20.3%  10.21;  Surgeon:  Mittie Gaskin, MD;  Location: [facility];  Service: Ophthalmology;  Laterality: Left;  sleep apnea requests early   COLONOSCOPY     last 2012 - [facility]   ESOPHAGOGASTRODUODENOSCOPY     Multiple, last 01/07/2017 with Savary dilation to 18 mm   EYE SURGERY  August 2022   Cataracs removed   FRACTURE SURGERY  2025   HEMORRHOID BANDING  2018   [facility]   INTRAMEDULLARY (IM) NAIL INTERTROCHANTERIC Right 09/06/2023   Procedure: INTRAMEDULLARY (IM) NAIL INTERTROCHANTERIC;  Surgeon: Celena Sharper, MD;  Location: [facility] OR;  Service: Orthopedics;  Laterality: Right;   LUMBAR DISC SURGERY  1984   L5    Patient Active Problem List   Diagnosis Date Noted   Vitamin D  deficiency 01/29/2024   Closed right hip fracture (HCC) 09/05/2023   Current use of proton pump inhibitor 03/13/2023   Pain in joint, lower leg 01/30/2023   Migraine    Essential hypertension    H/O: CVA (cerebrovascular accident) 11/08/2020   Colon cancer screening 02/05/2018   Degenerative joint disease (DJD) of lumbar spine 05/02/2017   Internal hemorrhoids 05/02/2017   Generalized osteoarthritis of hand 12/11/2016   GAD (generalized anxiety disorder) 12/11/2016   Estrogen deficiency 05/11/2016   Encounter for screening mammogram for breast cancer  05/11/2016   OSA (obstructive sleep apnea) 03/06/2016   Left knee pain 10/18/2015   Snoring 10/18/2015   Routine general medical examination at a health care facility 02/28/2015   Hyperlipidemia 08/27/2012   Osteopenia 12/17/2009   BACK PAIN, LUMBAR 07/02/2009   IRRITABLE BOWEL SYNDROME 04/02/2009   Asymptomatic postmenopausal status 12/10/2008   Hypothyroidism 12/05/2007   Meniere's disease 12/05/2007   GERD 12/05/2007    PCP: Dr. Laine Balls  REFERRING PROVIDER: Dr. Ozell Bruch  REFERRING DIAG: R hip fracture  THERAPY DIAG:  Abnormality of gait and mobility  Muscle weakness (generalized)  Difficulty in walking, not elsewhere classified  Pain in right  hip  Other lack of coordination  Other abnormalities of gait and mobility  Rationale for Evaluation and Treatment: Rehabilitation  ONSET DATE: 09/06/2023  SUBJECTIVE:   SUBJECTIVE STATEMENT: From Today: Patient reports doing well just lacking confidence in her leg and balance and stating her healing is taking a lot longer than she ever anticipated.   From Eval:  Patient reports feeling better overall and states her MD wants her back in PT to work her out and strengthen her hip and work on balance. She denies any recent falls and reports her hip is feeling better since she was in PT back in June.   PERTINENT HISTORY: Sarah Harper is a 84 year old female with past medical history significant for HTN, hypothyroidism, GERD, osteopenia, dyslipidemia, OSA who presented to [facility] ED on 1/15 fall mechanical fall at home sustaining injury to her right hip. Patient underwent intramedullary nailing of the right hip 09/06/2023 by Dr. Bruch. Patient was receiving outpatient PT in May however was admitted back to hospital with Hyponatremia and acute metabolic encephalopathy. She has continued to be followed since release from hospital by Dr. Bruch for ongoing hip pain and received a bone stimulator/ultrasound device to help with non-healing hip and -Ultrasound/bone stimulator treatments 2 sets of 6 week treatment. Has competed 80/90 treatemnt and has f/u with Dr. Bruch on 10/29.  PAIN:  Are you having pain? Yes: NPRS scale: 2/10  Pain location: Right posterior superior gluteal Pain description: aggravating achy- constant Aggravating factors: Worse with ultrasound treatment Relieving factors: lying flat  PRECAUTIONS: Other: WBAT RLE and no restrictions per MD order  RED FLAGS: None   WEIGHT BEARING RESTRICTIONS: R LE WBAT  FALLS:  Has patient fallen in last 6 months? No  LIVING ENVIRONMENT: Lives with: lives with their spouse Lives in: House/apartment Stairs: Yes: Internal: 2 steps; on right  going up Has following equipment at home: Vannie - 2 wheeled and Grab bars  OCCUPATION: Stage manager  PLOF: Independent  PATIENT GOALS: Hope to be able to walk on my own without a device and be able to drive independently  NEXT MD VISIT: 06/18/2024- Dr. Bruch  OBJECTIVE:   Note: Objective measures were completed at Evaluation unless otherwise noted.  DIAGNOSTIC FINDINGS:  ADDENDUM REPORT: 03/05/2024 08:07   ADDENDUM: The original report was by Dr. Ryan Salvage. The following addendum is by Dr. Ryan Salvage:   With respect to further specifics on the intertrochanteric fracture, the original fracture as shown on the radiographs from 09/05/2023 does include a small comminuted avulsion of the lesser trochanter, with a component measuring 2.4 by 1.0 by 0.6 cm, shown for example on image 63 series 5 and image 102 series 2. It is conceivable that the lucency described as likely hyperemic could have been a donor site for this fragment, although the fragment is about 1.9 cm  posterior to this lucency, and the natural action of the iliopsoas would tend to pull the fragment anteriorly and cephalad rather than posteriorly.   With respect to infection as a cause for the lucency, I am skeptical given the lack of abnormal lucency extending along the hardware, although if the patient is experiencing fever and signs of infection then close monitoring would be recommended.     Electronically Signed   By: Ryan Salvage M.D.   On: 03/05/2024 08:07    Addended by Salvage Sawyers, MD on 03/05/2024  8:09 AM    Study Result  Narrative & Impression  CLINICAL DATA:  Right SI joint pain. Prior right hip intertrochanteric fracture ORIF in January 2025.   EXAM: CT PELVIS WITHOUT CONTRAST   TECHNIQUE: Multidetector CT imaging of the pelvis was performed following the standard protocol without intravenous contrast.   RADIATION DOSE REDUCTION: This exam was performed  according to the departmental dose-optimization program which includes automated exposure control, adjustment of the mA and/or kV according to patient size and/or use of iterative reconstruction technique.   COMPARISON:  Multiple exams, including radiographs 09/05/2023   FINDINGS: OSSEOUS STRUCTURES AND JOINTS:   Lower lumbar degenerative facet arthropathy. No SI joint diastasis. A small amount of nitrogen gas phenomenon in both SI joints without SI joint effusion observed. No erosions or other specific abnormalities along the SI joints aside from mild spurring.   No pelvic fracture observed.   Right femoral nail system in place with single distal interlocking screw and femoral head screw noted. This traverses the intertrochanteric fracture which is still visible, with notable lucency along part of the medial fracture margin on image 52 series 5 and image 99 series 2 probably from hyperemic resorption of bone in this local vicinity. There is potentially some early bridging of bone anteriorly at the fracture site. Early cortication along the posterior portion of the fracture may suggest early nonunion of the posterior portion for example as on image 62 series 5.   No abnormal lucency around the components of the nail system to indicate loosening or infection.   MUSCULOTENDINOUS:   Unremarkable   SACRAL PLEXUS:   No impinging lesion along the sacral plexus or proximal sciatic nerves.   OTHER:   Prominent stool throughout the colon favors constipation. Mild degenerative arthropathy of the left (contralateral) hip.   IMPRESSION: 1. No specific abnormality is identified to explain the patient's right SI joint pain. 2. Right femoral nail system in place traversing the intertrochanteric fracture. Questionable partial early nonunion of the posterior portion of the fracture anterior bridging is appreciated. There is some accentuated lucency along the medial portion of the  fracture probably from hyperemic resorption of bone, but no abnormal extension of lucency along components of the IM nail or femoral head nail to suggest loosening/infection. 3. Prominent stool throughout the colon favors constipation. 4. Mild degenerative arthropathy of the left (contralateral) hip. 5. Lower lumbar degenerative facet arthropathy.   Electronically Signed: By: Ryan Salvage M.D. On: 01/15/2024 16:54     PATIENT SURVEYS:  ABC scale: The Activities-Specific Balance Confidence (ABC) Scale 0% 10 20 30  40 50 60 70 80 90 100% No confidence<->completely confident  How confident are you that you will not lose your balance or become unsteady when you . . .   Date tested 06/10/2024 (with walker)        08/25/2024 (with cane)  Walk around the house 80% 70  2. Walk up or down stairs 20% 0%  w/o rail  3. Bend over and pick up a slipper from in front of a closet floor 30% 50  4. Reach for a small can off a shelf at eye level 80% 70  5. Stand on tip toes and reach for something above your head 20% 0  6. Stand on a chair and reach for something 0% 0  7. Sweep the floor 60% 30  8. Walk outside the house to a car parked in the driveway 50% 0  9. Get into or out of a car 50% 10  10. Walk across a parking lot to the mall 10% 0  11. Walk up or down a ramp 50% 0  12. Walk in a crowded mall where people rapidly walk past you 10% 10  13. Are bumped into by people as you walk through the mall 10% 0  14. Step onto or off of an escalator while you are holding onto the railing 20% 10  15. Step onto or off an escalator while holding onto parcels such that you cannot hold onto the railing 10% 0  16. Walk outside on icy sidewalks 0% 0  Total: #/16 26.25% 15.62     COGNITION: Overall cognitive status: Within functional limits for tasks assessed     SENSATION: WFL   POSTURE: rounded shoulders and forward head  PALPATION: (+) tenderness (lateral hip and posteior right off edge of R  sided sacrum)   LOWER EXTREMITY ROM:  Active ROM Right eval Left eval  Hip flexion    Hip extension    Hip abduction    Hip adduction    Hip internal rotation    Hip external rotation    Knee flexion    Knee extension    Ankle dorsiflexion    Ankle plantarflexion    Ankle inversion    Ankle eversion     (Blank rows = not tested)  LOWER EXTREMITY MMT:  MMT Right eval Left eval  Hip flexion 4 4+  Hip extension    Hip abduction 4 4+  Hip adduction    Hip internal rotation 4 4+  Hip external rotation 4 4+  Knee flexion 4 4  Knee extension 4 4  Ankle dorsiflexion 4+ 4+  Ankle plantarflexion    Ankle inversion    Ankle eversion     (Blank rows = not tested)  LOWER EXTREMITY SPECIAL TESTS:  None  FUNCTIONAL TESTS:  30 seconds chair stand test= Unable to stand without UE support today but did perform 10 reps with min UE support Timed up and go (TUG): 15.10 and 15.33=15.2 sec with RW 6 minute walk test: to be assessed next visit. 10 meter walk test: 12.10 and 11.69 sec =0.85 m/s avg with RW  Berg Balance Scale:  Item Test date: 06/10/2024 Date:  Date:   Sitting to standing 3. able to stand independently using hands Insert SmartPhrase OPRCBERGREEVAL Insert SmartPhrase OPRCBERGREEVAL  2. Standing unsupported 4. able to stand safely for 2 minutes    3. Sitting with back unsupported, feet supported 4. able to sit safely and securely for 2 minutes    4. Standing to sitting 3. controls descent by using hands    5. Pivot transfer  3. able to transfer safely with definite need of hands    6. Standing unsupported with eyes closed 4. able to stand 10 seconds safely    7. Standing unsupported with feet together 4. able to place feet together independently and stand 1 minute safely  8. Reaching forward with outstretched arms while standing 4. can reach forward confidently 25 cm (10 inches)    9. Pick up object from the floor from standing 3. able to pick up slipper but needs  supervision    10. Turning to look behind over left and right shoulders while standing 2. turns sideways but only maintains balance    11. Turn 360 degrees 2. able to turn 360 degrees safely but slowly    12. Place alternate foot on step or stool while standing unsupported 3. able to stand independently and complete 8 steps in > 20 seconds     13. Standing unsupported one foot in front 2. able to take small step independently and hold 30 seconds    14. Standing on one leg 1. tries to lift leg unable to hold 3 seconds but remains standing independently.      Total Score 42/56 Total Score:    Total Score:      GAIT: Distance walked: 150 feet Assistive device utilized: Walker - 2 wheeled Level of assistance: CGA Comments: Mild antalgia noted on right and decreased step length - short reciprocal steps with use of RW                                                                                                                                TREATMENT DATE: 08/25/2024    Unless otherwise stated, CGA was provided and gait belt donned in order to ensure pt safety  Physical therapy treatment session today consisted of completing assessment of goals and administration of testing as demonstrated and documented in flow sheet, treatment, and goals section of this note. Addition treatments may be found below.   10 Meter Walk Test: Patient instructed to walk 10 meters (32.8 ft) as quickly and as safely as possible at their normal speed x2 and at a fast speed x2. Time measured from 2 meter mark to 8 meter mark to accommodate ramp-up and ramp-down.  Normal speed 1: 0.89 m/s Normal speed 2: 0.88 m/s Average Normal speed: 0.89 m/s using Multipoint cane Cut off scores: <0.4 m/s = household Ambulator, 0.4-0.8 m/s = limited community Ambulator, >0.8 m/s = community Ambulator, >1.2 m/s = crossing a street, <1.0 = increased fall risk MCID 0.05 m/s (small), 0.13 m/s (moderate), 0.06 m/s (significant)  (ANPTA  Core Set of Outcome Measures for Adults with Neurologic Conditions, 2018)   30 sec Sit to stand test= 11 reps without UE Support  6 Min Walk Test:  Instructed patient to ambulate as quickly and as safely as possible for 6 minutes using LRAD. Patient was allowed to take standing rest breaks without stopping the test, but if the patient required a sitting rest break the clock would be stopped and the test would be over.  Results: 1065 feet with cane (324 meters, Avg speed 0.9 m/s) using a cane with Supervision. Results indicate that the patient has reduced endurance with  ambulation compared to age matched norms.  Age Matched Norms: 72-69 yo M: 57 F: 42, 15-79 yo M: 74 F: 471, 40-84 yo M: 417 F: 392 MDC: 58.21 meters (190.98 feet) or 50 meters (ANPTA Core Set of Outcome Measures for Adults with Neurologic Conditions, 2018)     PT instructed pt in TUG: 15.26 sec and 14.71 sec= 14.99 sec avg (average of 3 trials; >13.5 sec indicates increased fall risk)     [facility] PT Assessment - 08/25/24 1041       Berg Balance Test   Sit to Stand Able to stand without using hands and stabilize independently    Standing Unsupported Able to stand safely 2 minutes    Sitting with Back Unsupported but Feet Supported on Floor or Stool Able to sit safely and securely 2 minutes    Stand to Sit Sits safely with minimal use of hands    Transfers Able to transfer safely, minor use of hands    Standing Unsupported with Eyes Closed Able to stand 10 seconds safely    Standing Unsupported with Feet Together Able to place feet together independently and stand for 1 minute with supervision    From Standing, Reach Forward with Outstretched Arm Can reach confidently >25 cm (10)    From Standing Position, Pick up Object from Floor Able to pick up shoe, needs supervision    From Standing Position, Turn to Look Behind Over each Shoulder Looks behind one side only/other side shows less weight shift    Turn 360 Degrees Able to  turn 360 degrees safely one side only in 4 seconds or less    Standing Unsupported, Alternately Place Feet on Step/Stool Able to stand independently and safely and complete 8 steps in 20 seconds    Standing Unsupported, One Foot in Front Able to take small step independently and hold 30 seconds    Standing on One Leg Tries to lift leg/unable to hold 3 seconds but remains standing independently    Total Score 47          The Activities-Specific Balance Confidence (ABC) Scale- See above chart (08/25/2024)         PATIENT EDUCATION:  Education details: rationale for 6 min walk and instruction in HEP Person educated: Patient and Spouse Education method: Explanation Education comprehension: verbalized understanding  HOME EXERCISE PROGRAM: Access Code: [email or web address] URL: [email or web address] Date: 06/16/2024 Prepared by: Reyes London  Exercises - Single Leg Stance  - 3 x weekly - 5-10 sets - up to 20 sec hold - Standing Hip Flexion March  - 3 x weekly - 3 sets - 10 reps - 2 sec hold - Tandem Stance  - 3 x weekly - 5 sets - 30 sec  hold - Side Stepping with Resistance at Ankles  - 3 x weekly - 3 sets - 10 reps - Sit to Stand with Arms Crossed  - 3 x weekly - 3 sets - 10 reps  ASSESSMENT:  CLINICAL IMPRESSION: Patient presents with good motivation for progress report visit today. Overall she continues to progress functionally- Improved 30 sec sit to stand and now ability to stand without UE support. She is now consistently walking with cane with improved overall TUG, 10 MWT and 6 min walk values. She also demonstrated improved balance as seen by improved BERG score. Despite her progress she continues to have decreased confidence in her balance and ability as seen by decreased ABC score (although reported her answers using a cane vs. Her walker)  Pt will continue to benefit from skilled physical therapy intervention to address impairments, improve QOL, and attain therapy  goals.    OBJECTIVE IMPAIRMENTS: Abnormal gait, cardiopulmonary status limiting activity, decreased activity tolerance, decreased balance, decreased coordination, decreased endurance, decreased mobility, difficulty walking, decreased strength, and pain.   ACTIVITY LIMITATIONS: carrying, lifting, bending, sitting, standing, squatting, stairs, and transfers  PARTICIPATION LIMITATIONS: meal prep, cleaning, laundry, driving, shopping, community activity, and yard work  PERSONAL FACTORS: 1-2 comorbidities: HTN, Arthritis are also affecting patient's functional outcome.   REHAB POTENTIAL: Good  CLINICAL DECISION MAKING: Stable/uncomplicated  EVALUATION COMPLEXITY: Low   GOALS: Goals reviewed with patient? Yes  SHORT TERM GOALS: Target date: 07/22/2024 Pt will be independent with HEP in order to improve strength and balance in order to decrease fall risk and improve function at home. Baseline: EVAL- Patient reports has not been performing any former HEP since her hospitalization in June; 07/16/2024= HEP are going pretty well, no pain and compliant as best I can.  Goal status: MET   LONG TERM GOALS: Target date: 09/02/2024  Patient will report < 4/10 Right hip pain at worst with all functional activities.  Baseline: EVAL= worst = 7/10; 07/16/2024= 0/10 current but up to 6/10 at worst; 08/25/2024= 3/10 Right hip pain Goal status: PROGRESS  2.  Patient will complete >12 reps without use of UE support in 30 second sit to stand test indicating an increased LE strength and improved balance. Baseline: EVAL: Unable to stand without UE support today but did perform 10 reps with min UE support; 08/25/2024=11 reps without UE support  Goal status: INITIAL  3.  Patient will improve ABC score by 15 points to demonstrate statistically significant improvement in mobility and quality of life as it relates to their confidence in their balance.  Baseline: EVAL: 26.25; 08/25/2024=  Goal status: INITIAL   4.   Patient will increase Berg Balance score by > 6 points to demonstrate decreased fall risk during functional activities. Baseline: EVAL= 42/56; 08/25/2024= 47/56  Goal status: PROGRESS    5.   Patient will reduce timed up and go to <11 seconds to reduce fall risk and demonstrate improved transfer/gait ability. Baseline: EVAL= 15.2 sec with RW; 08/25/2024= 14.99 sec with Cane Goal status: PROGRESSING  6.   Patient will increase 10 meter walk test to >1.75m/s as to improve gait speed for better community ambulation and to reduce fall risk. Baseline: EVAL= 0.85 m/s with RW; 08/26/2023= 0.89 m/s with multipoint cane Goal status: PROGRESSING  7.   Patient will increase six minute walk test distance to >1000 for progression to community ambulator and improve gait ability Baseline: Eval= To be completed at visit #2; 06/16/2024= 805 feet with RW; 08/20/24= 950 feet with Cane and 3#AW on each LE; 08/25/2024= 1065 feet with cane Goal status: PROGRESSING    PLAN:  PT FREQUENCY: 1-2x/week  PT DURATION: 12 weeks  PLANNED INTERVENTIONS: [id number]- PT Re-evaluation, 97750- Physical Performance Testing, 97110-Therapeutic exercises, 97530- Therapeutic activity, [id number]- Neuromuscular re-education, 97535- Self Care, [id number]- Manual therapy, [id number]- Gait training, [id number]- Orthotic Initial, [id number]- Orthotic/Prosthetic subsequent, [phone]- Canalith repositioning, [id number]- Electrical stimulation (manual), [phone] (1-2 muscles), [id number] (3+ muscles)- Dry Needling, Patient/Family education, Balance training, Stair training, Taping, Joint mobilization, Joint manipulation, Spinal mobilization, Vestibular training, DME instructions, Cryotherapy, and Moist heat  PLAN FOR NEXT SESSION:  R hip strengthening Gait training with cane Balance training Add to HEP as appropriate.    Reyes LOISE London, PT 08/26/2024, 8:17 AM  "

## 2024-08-27 ENCOUNTER — Ambulatory Visit

## 2024-08-27 DIAGNOSIS — R262 Difficulty in walking, not elsewhere classified: Secondary | ICD-10-CM

## 2024-08-27 DIAGNOSIS — M6281 Muscle weakness (generalized): Secondary | ICD-10-CM

## 2024-08-27 DIAGNOSIS — R269 Unspecified abnormalities of gait and mobility: Secondary | ICD-10-CM

## 2024-08-27 DIAGNOSIS — R278 Other lack of coordination: Secondary | ICD-10-CM

## 2024-08-27 DIAGNOSIS — M25551 Pain in right hip: Secondary | ICD-10-CM

## 2024-08-27 DIAGNOSIS — R2689 Other abnormalities of gait and mobility: Secondary | ICD-10-CM

## 2024-08-27 NOTE — Therapy (Signed)
 " OUTPATIENT PHYSICAL THERAPY LOWER EXTREMITY TREATMENT    Patient Name: Sarah Harper MRN: [id number] DOB:[birth year], 84 y.o., female Today's Date: 08/27/2024  END OF SESSION:  PT End of Session - 08/27/24 1006     Visit Number 21    Number of Visits 24    Date for Recertification  09/03/23    Progress Note Due on Visit 30    PT Start Time 1006    PT Stop Time 1048    PT Time Calculation (min) 42 min    Equipment Utilized During Treatment Gait belt    Activity Tolerance Patient tolerated treatment well    Behavior During Therapy WFL for tasks assessed/performed                  Past Medical History:  Diagnosis Date   Anxiety 2022   Arthritis    Blood loss anemia 09/13/2023   Cataract 1997   Colon cancer screening 02/05/2018   DDD (degenerative disc disease)    chronic back pain   ETD (eustachian tube dysfunction)    GERD (gastroesophageal reflux disease)    Hypertension 2022   IBS (irritable bowel syndrome)    Meniere's disease    Migraine    still gets visual aura from time to time   Mild cognitive impairment    Osteoporosis    Sleep apnea 2017   CPAP   Stroke (HCC) 2022   Syncopal episodes    after work-up - possible seizures?   Thyroid  disease 1990   hypothyroid   TIA (transient ischemic attack) 11/09/2020   Past Surgical History:  Procedure Laterality Date   APPENDECTOMY     CATARACT EXTRACTION W/PHACO Right 05/21/2019   Procedure: CATARACT EXTRACTION PHACO AND INTRAOCULAR LENS PLACEMENT (IOC) RIGHT panoptix lens  00:35.0  21.7%  7.61;  Surgeon: Sarah Gaskin, MD;  Location: [facility];  Service: Ophthalmology;  Laterality: Right;  sleep apnea requests early   CATARACT EXTRACTION W/PHACO Left 06/11/2019   Procedure: CATARACT EXTRACTION PHACO AND INTRAOCULAR LENS PLACEMENT (IOC) LEFT PANOPTIX TORIC LENS  00:50.0  20.3%  10.21;  Surgeon: Sarah Gaskin, MD;  Location: [facility];  Service: Ophthalmology;  Laterality:  Left;  sleep apnea requests early   COLONOSCOPY     last 2012 - [facility]   ESOPHAGOGASTRODUODENOSCOPY     Multiple, last 01/07/2017 with Savary dilation to 18 mm   EYE SURGERY  August 2022   Cataracs removed   FRACTURE SURGERY  2025   HEMORRHOID BANDING  2018   [facility]   INTRAMEDULLARY (IM) NAIL INTERTROCHANTERIC Right 09/06/2023   Procedure: INTRAMEDULLARY (IM) NAIL INTERTROCHANTERIC;  Surgeon: Sarah Sharper, MD;  Location: [facility];  Service: Orthopedics;  Laterality: Right;   LUMBAR DISC SURGERY  1984   L5    Patient Active Problem List   Diagnosis Date Noted   Vitamin D  deficiency 01/29/2024   Closed right hip fracture (HCC) 09/05/2023   Current use of proton pump inhibitor 03/13/2023   Pain in joint, lower leg 01/30/2023   Migraine    Essential hypertension    H/O: CVA (cerebrovascular accident) 11/08/2020   Colon cancer screening 02/05/2018   Degenerative joint disease (DJD) of lumbar spine 05/02/2017   Internal hemorrhoids 05/02/2017   Generalized osteoarthritis of hand 12/11/2016   GAD (generalized anxiety disorder) 12/11/2016   Estrogen deficiency 05/11/2016   Encounter for screening mammogram for breast cancer 05/11/2016   OSA (obstructive sleep apnea) 03/06/2016   Left knee pain 10/18/2015  Snoring 10/18/2015   Routine general medical examination at a health care facility 02/28/2015   Hyperlipidemia 08/27/2012   Osteopenia 12/17/2009   BACK PAIN, LUMBAR 07/02/2009   IRRITABLE BOWEL SYNDROME 04/02/2009   Asymptomatic postmenopausal status 12/10/2008   Hypothyroidism 12/05/2007   Meniere's disease 12/05/2007   GERD 12/05/2007    PCP: Dr. Laine Harper  REFERRING PROVIDER: Dr. Ozell Harper  REFERRING DIAG: R hip fracture  THERAPY DIAG:  Abnormality of gait and mobility  Muscle weakness (generalized)  Difficulty in walking, not elsewhere classified  Pain in right hip  Other lack of coordination  Other abnormalities of gait and mobility  Rationale for  Evaluation and Treatment: Rehabilitation  ONSET DATE: 09/06/2023  SUBJECTIVE:   SUBJECTIVE STATEMENT: From Today: Patient offers no new complaints or issues.   From Eval:  Patient reports feeling better overall and states her MD wants her back in PT to work her out and strengthen her hip and work on balance. She denies any recent falls and reports her hip is feeling better since she was in PT back in June.   PERTINENT HISTORY: Sarah Harper is a 84 year old female with past medical history significant for HTN, hypothyroidism, GERD, osteopenia, dyslipidemia, OSA who presented to [facility] ED on 1/15 fall mechanical fall at home sustaining injury to her right hip. Patient underwent intramedullary nailing of the right hip 09/06/2023 by Sarah Harper. Patient was receiving outpatient PT in May however was admitted back to hospital with Hyponatremia and acute metabolic encephalopathy. She has continued to be followed since release from hospital by Sarah Harper for ongoing hip pain and received a bone stimulator/ultrasound device to help with non-healing hip and -Ultrasound/bone stimulator treatments 2 sets of 6 week treatment. Has competed 80/90 treatemnt and has f/u with Sarah Harper on 10/29.  PAIN:  Are you having pain? Yes: NPRS scale: 2/10  Pain location: Right posterior superior gluteal Pain description: aggravating achy- constant Aggravating factors: Worse with ultrasound treatment Relieving factors: lying flat  PRECAUTIONS: Other: WBAT RLE and no restrictions per MD order  RED FLAGS: None   WEIGHT BEARING RESTRICTIONS: R LE WBAT  FALLS:  Has patient fallen in last 6 months? No  LIVING ENVIRONMENT: Lives with: lives with their spouse Lives in: House/apartment Stairs: Yes: Internal: 2 steps; on right going up Has following equipment at home: Vannie - 2 wheeled and Grab bars  OCCUPATION: Stage manager  PLOF: Independent  PATIENT GOALS: Hope to be able to walk on my own without a  device and be able to drive independently  NEXT MD VISIT: 06/18/2024- Sarah Harper  OBJECTIVE:   Note: Objective measures were completed at Evaluation unless otherwise noted.  DIAGNOSTIC FINDINGS:  ADDENDUM REPORT: 03/05/2024 08:07   ADDENDUM: The original report was by Dr. Ryan Salvage. The following addendum is by Dr. Ryan Salvage:   With respect to further specifics on the intertrochanteric fracture, the original fracture as shown on the radiographs from 09/05/2023 does include a small comminuted avulsion of the lesser trochanter, with a component measuring 2.4 by 1.0 by 0.6 cm, shown for example on image 63 series 5 and image 102 series 2. It is conceivable that the lucency described as likely hyperemic could have been a donor site for this fragment, although the fragment is about 1.9 cm posterior to this lucency, and the natural action of the iliopsoas would tend to pull the fragment anteriorly and cephalad rather than posteriorly.   With respect to infection as a cause for the  lucency, I am skeptical given the lack of abnormal lucency extending along the hardware, although if the patient is experiencing fever and signs of infection then close monitoring would be recommended.     Electronically Signed   By: Ryan Salvage M.D.   On: 03/05/2024 08:07    Addended by Salvage Sawyers, MD on 03/05/2024  8:09 AM    Study Result  Narrative & Impression  CLINICAL DATA:  Right SI joint pain. Prior right hip intertrochanteric fracture ORIF in January 2025.   EXAM: CT PELVIS WITHOUT CONTRAST   TECHNIQUE: Multidetector CT imaging of the pelvis was performed following the standard protocol without intravenous contrast.   RADIATION DOSE REDUCTION: This exam was performed according to the departmental dose-optimization program which includes automated exposure control, adjustment of the mA and/or kV according to patient size and/or use of iterative reconstruction  technique.   COMPARISON:  Multiple exams, including radiographs 09/05/2023   FINDINGS: OSSEOUS STRUCTURES AND JOINTS:   Lower lumbar degenerative facet arthropathy. No SI joint diastasis. A small amount of nitrogen gas phenomenon in both SI joints without SI joint effusion observed. No erosions or other specific abnormalities along the SI joints aside from mild spurring.   No pelvic fracture observed.   Right femoral nail system in place with single distal interlocking screw and femoral head screw noted. This traverses the intertrochanteric fracture which is still visible, with notable lucency along part of the medial fracture margin on image 52 series 5 and image 99 series 2 probably from hyperemic resorption of bone in this local vicinity. There is potentially some early bridging of bone anteriorly at the fracture site. Early cortication along the posterior portion of the fracture may suggest early nonunion of the posterior portion for example as on image 62 series 5.   No abnormal lucency around the components of the nail system to indicate loosening or infection.   MUSCULOTENDINOUS:   Unremarkable   SACRAL PLEXUS:   No impinging lesion along the sacral plexus or proximal sciatic nerves.   OTHER:   Prominent stool throughout the colon favors constipation. Mild degenerative arthropathy of the left (contralateral) hip.   IMPRESSION: 1. No specific abnormality is identified to explain the patient's right SI joint pain. 2. Right femoral nail system in place traversing the intertrochanteric fracture. Questionable partial early nonunion of the posterior portion of the fracture anterior bridging is appreciated. There is some accentuated lucency along the medial portion of the fracture probably from hyperemic resorption of bone, but no abnormal extension of lucency along components of the IM nail or femoral head nail to suggest loosening/infection. 3. Prominent stool  throughout the colon favors constipation. 4. Mild degenerative arthropathy of the left (contralateral) hip. 5. Lower lumbar degenerative facet arthropathy.   Electronically Signed: By: Ryan Salvage M.D. On: 01/15/2024 16:54     PATIENT SURVEYS:  ABC scale: The Activities-Specific Balance Confidence (ABC) Scale 0% 10 20 30  40 50 60 70 80 90 100% No confidence<->completely confident  How confident are you that you will not lose your balance or become unsteady when you . . .   Date tested 06/10/2024 (with walker)        08/25/2024 (with cane)  Walk around the house 80% 70  2. Walk up or down stairs 20% 0% w/o rail  3. Bend over and pick up a slipper from in front of a closet floor 30% 50  4. Reach for a small can off a shelf at eye level 80%  70  5. Stand on tip toes and reach for something above your head 20% 0  6. Stand on a chair and reach for something 0% 0  7. Sweep the floor 60% 30  8. Walk outside the house to a car parked in the driveway 50% 0  9. Get into or out of a car 50% 10  10. Walk across a parking lot to the mall 10% 0  11. Walk up or down a ramp 50% 0  12. Walk in a crowded mall where people rapidly walk past you 10% 10  13. Are bumped into by people as you walk through the mall 10% 0  14. Step onto or off of an escalator while you are holding onto the railing 20% 10  15. Step onto or off an escalator while holding onto parcels such that you cannot hold onto the railing 10% 0  16. Walk outside on icy sidewalks 0% 0  Total: #/16 26.25% 15.62     COGNITION: Overall cognitive status: Within functional limits for tasks assessed     SENSATION: WFL   POSTURE: rounded shoulders and forward head  PALPATION: (+) tenderness (lateral hip and posteior right off edge of R sided sacrum)   LOWER EXTREMITY ROM:  Active ROM Right eval Left eval  Hip flexion    Hip extension    Hip abduction    Hip adduction    Hip internal rotation    Hip external rotation     Knee flexion    Knee extension    Ankle dorsiflexion    Ankle plantarflexion    Ankle inversion    Ankle eversion     (Blank rows = not tested)  LOWER EXTREMITY MMT:  MMT Right eval Left eval  Hip flexion 4 4+  Hip extension    Hip abduction 4 4+  Hip adduction    Hip internal rotation 4 4+  Hip external rotation 4 4+  Knee flexion 4 4  Knee extension 4 4  Ankle dorsiflexion 4+ 4+  Ankle plantarflexion    Ankle inversion    Ankle eversion     (Blank rows = not tested)  LOWER EXTREMITY SPECIAL TESTS:  None  FUNCTIONAL TESTS:  30 seconds chair stand test= Unable to stand without UE support today but did perform 10 reps with min UE support Timed up and go (TUG): 15.10 and 15.33=15.2 sec with RW 6 minute walk test: to be assessed next visit. 10 meter walk test: 12.10 and 11.69 sec =0.85 m/s avg with RW  Berg Balance Scale:  Item Test date: 06/10/2024 Date:  Date:   Sitting to standing 3. able to stand independently using hands Insert SmartPhrase OPRCBERGREEVAL Insert SmartPhrase OPRCBERGREEVAL  2. Standing unsupported 4. able to stand safely for 2 minutes    3. Sitting with back unsupported, feet supported 4. able to sit safely and securely for 2 minutes    4. Standing to sitting 3. controls descent by using hands    5. Pivot transfer  3. able to transfer safely with definite need of hands    6. Standing unsupported with eyes closed 4. able to stand 10 seconds safely    7. Standing unsupported with feet together 4. able to place feet together independently and stand 1 minute safely    8. Reaching forward with outstretched arms while standing 4. can reach forward confidently 25 cm (10 inches)    9. Pick up object from the floor from standing 3. able to  pick up slipper but needs supervision    10. Turning to look behind over left and right shoulders while standing 2. turns sideways but only maintains balance    11. Turn 360 degrees 2. able to turn 360 degrees safely but  slowly    12. Place alternate foot on step or stool while standing unsupported 3. able to stand independently and complete 8 steps in > 20 seconds     13. Standing unsupported one foot in front 2. able to take small step independently and hold 30 seconds    14. Standing on one leg 1. tries to lift leg unable to hold 3 seconds but remains standing independently.      Total Score 42/56 Total Score:    Total Score:      GAIT: Distance walked: 150 feet Assistive device utilized: Walker - 2 wheeled Level of assistance: CGA Comments: Mild antalgia noted on right and decreased step length - short reciprocal steps with use of RW                                                                                                                                TREATMENT DATE: 08/27/2024    Unless otherwise stated, CGA was provided and gait belt donned in order to ensure pt safety  TA: Sit to stand holding 6lb ball x 12 reps Mini lunge squat- Knee down to pad on step stool 2 x 10 reps each LE Single leg RDL x 10 reps (R quad pain- stopped)  Hip hike on step 2 x 10 reps (patient reports some pain with R glute so ceased)  Rear lunge curtsy x 15 reps ea LE Step up + (High knee march on opp LE)- x 15 reps Airplane stance with Hip Ext+ER with 1 UE support x 15 reps ea Gait with cane x 450 feet with good reciprocal     PATIENT EDUCATION:  Education details: rationale for 6 min walk and instruction in HEP Person educated: Patient and Spouse Education method: Explanation Education comprehension: verbalized understanding  HOME EXERCISE PROGRAM: Access Code: [email or web address] URL: [email or web address] Date: 06/16/2024 Prepared by: Reyes London  Exercises - Single Leg Stance  - 3 x weekly - 5-10 sets - up to 20 sec hold - Standing Hip Flexion March  - 3 x weekly - 3 sets - 10 reps - 2 sec hold - Tandem Stance  - 3 x weekly - 5 sets - 30 sec  hold - Side Stepping with Resistance at  Ankles  - 3 x weekly - 3 sets - 10 reps - Sit to Stand with Arms Crossed  - 3 x weekly - 3 sets - 10 reps  ASSESSMENT:  CLINICAL IMPRESSION: Treatment consisted of continuing with progressive R LE strengthening through functional activities incorporating coordination and hip stability. She experienced more R hip and quad soreness with SL weightbearing on RLE today and with partial lunges- Pain better with rest. She  overall responded well with no report of residual RLE pain upon departure. No antalgia with observed gait with cane as well.  Pt will continue to benefit from skilled physical therapy intervention to address impairments, improve QOL, and attain therapy goals.    OBJECTIVE IMPAIRMENTS: Abnormal gait, cardiopulmonary status limiting activity, decreased activity tolerance, decreased balance, decreased coordination, decreased endurance, decreased mobility, difficulty walking, decreased strength, and pain.   ACTIVITY LIMITATIONS: carrying, lifting, bending, sitting, standing, squatting, stairs, and transfers  PARTICIPATION LIMITATIONS: meal prep, cleaning, laundry, driving, shopping, community activity, and yard work  PERSONAL FACTORS: 1-2 comorbidities: HTN, Arthritis are also affecting patient's functional outcome.   REHAB POTENTIAL: Good  CLINICAL DECISION MAKING: Stable/uncomplicated  EVALUATION COMPLEXITY: Low   GOALS: Goals reviewed with patient? Yes  SHORT TERM GOALS: Target date: 07/22/2024 Pt will be independent with HEP in order to improve strength and balance in order to decrease fall risk and improve function at home. Baseline: EVAL- Patient reports has not been performing any former HEP since her hospitalization in June; 07/16/2024= HEP are going pretty well, no pain and compliant as best I can.  Goal status: MET   LONG TERM GOALS: Target date: 09/02/2024  Patient will report < 4/10 Right hip pain at worst with all functional activities.  Baseline: EVAL= worst =  7/10; 07/16/2024= 0/10 current but up to 6/10 at worst; 08/25/2024= 3/10 Right hip pain Goal status: PROGRESS  2.  Patient will complete >12 reps without use of UE support in 30 second sit to stand test indicating an increased LE strength and improved balance. Baseline: EVAL: Unable to stand without UE support today but did perform 10 reps with min UE support; 08/25/2024=11 reps without UE support  Goal status: INITIAL  3.  Patient will improve ABC score by 15 points to demonstrate statistically significant improvement in mobility and quality of life as it relates to their confidence in their balance.  Baseline: EVAL: 26.25; 08/25/2024=  Goal status: INITIAL   4.  Patient will increase Berg Balance score by > 6 points to demonstrate decreased fall risk during functional activities. Baseline: EVAL= 42/56; 08/25/2024= 47/56  Goal status: PROGRESS    5.   Patient will reduce timed up and go to <11 seconds to reduce fall risk and demonstrate improved transfer/gait ability. Baseline: EVAL= 15.2 sec with RW; 08/25/2024= 14.99 sec with Cane Goal status: PROGRESSING  6.   Patient will increase 10 meter walk test to >1.61m/s as to improve gait speed for better community ambulation and to reduce fall risk. Baseline: EVAL= 0.85 m/s with RW; 08/26/2023= 0.89 m/s with multipoint cane Goal status: PROGRESSING  7.   Patient will increase six minute walk test distance to >1000 for progression to community ambulator and improve gait ability Baseline: Eval= To be completed at visit #2; 06/16/2024= 805 feet with RW; 08/20/24= 950 feet with Cane and 3#AW on each LE; 08/25/2024= 1065 feet with cane Goal status: PROGRESSING    PLAN:  PT FREQUENCY: 1-2x/week  PT DURATION: 12 weeks  PLANNED INTERVENTIONS: [id number]- PT Re-evaluation, 97750- Physical Performance Testing, 97110-Therapeutic exercises, 97530- Therapeutic activity, [id number]- Neuromuscular re-education, 97535- Self Care, [id number]- Manual therapy, [id number]- Gait  training, [id number]- Orthotic Initial, [id number]- Orthotic/Prosthetic subsequent, [phone]- Canalith repositioning, [id number]- Electrical stimulation (manual), [phone] (1-2 muscles), [id number] (3+ muscles)- Dry Needling, Patient/Family education, Balance training, Stair training, Taping, Joint mobilization, Joint manipulation, Spinal mobilization, Vestibular training, DME instructions, Cryotherapy, and Moist heat  PLAN FOR NEXT SESSION:  R hip strengthening Gait training with cane  Balance training Add to HEP as appropriate.    Reyes LOISE London, PT 08/27/2024, 10:52 AM  "

## 2024-08-29 ENCOUNTER — Ambulatory Visit: Admitting: Podiatry

## 2024-08-29 DIAGNOSIS — M79675 Pain in left toe(s): Secondary | ICD-10-CM | POA: Diagnosis not present

## 2024-08-29 DIAGNOSIS — M79674 Pain in right toe(s): Secondary | ICD-10-CM | POA: Diagnosis not present

## 2024-08-29 DIAGNOSIS — B351 Tinea unguium: Secondary | ICD-10-CM | POA: Diagnosis not present

## 2024-09-01 ENCOUNTER — Ambulatory Visit

## 2024-09-01 DIAGNOSIS — R269 Unspecified abnormalities of gait and mobility: Secondary | ICD-10-CM

## 2024-09-01 DIAGNOSIS — R278 Other lack of coordination: Secondary | ICD-10-CM

## 2024-09-01 DIAGNOSIS — M6281 Muscle weakness (generalized): Secondary | ICD-10-CM

## 2024-09-01 DIAGNOSIS — R262 Difficulty in walking, not elsewhere classified: Secondary | ICD-10-CM

## 2024-09-01 DIAGNOSIS — R2689 Other abnormalities of gait and mobility: Secondary | ICD-10-CM

## 2024-09-01 DIAGNOSIS — M25551 Pain in right hip: Secondary | ICD-10-CM

## 2024-09-01 NOTE — Therapy (Signed)
 " OUTPATIENT PHYSICAL THERAPY LOWER EXTREMITY TREATMENT/RECERT   Patient Name: PRECIOSA BUNDRICK MRN: [id number] DOB:02-24-1941, 84 y.o., female Today's Date: 09/01/2024  END OF SESSION:  PT End of Session - 09/01/24 1125     Visit Number 22    Number of Visits 46    Date for Recertification  11/24/24    Progress Note Due on Visit 30    PT Start Time 1015    PT Stop Time 1100    PT Time Calculation (min) 45 min    Equipment Utilized During Treatment Gait belt    Activity Tolerance Patient tolerated treatment well    Behavior During Therapy [facility] for tasks assessed/performed                   Past Medical History:  Diagnosis Date   Anxiety 2022   Arthritis    Blood loss anemia 09/13/2023   Cataract 1997   Colon cancer screening 02/05/2018   DDD (degenerative disc disease)    chronic back pain   ETD (eustachian tube dysfunction)    GERD (gastroesophageal reflux disease)    Hypertension 2022   IBS (irritable bowel syndrome)    Meniere's disease    Migraine    still gets visual aura from time to time   Mild cognitive impairment    Osteoporosis    Sleep apnea 2017   CPAP   Stroke (HCC) 2022   Syncopal episodes    after work-up - possible seizures?   Thyroid  disease 1990   hypothyroid   TIA (transient ischemic attack) 11/09/2020   Past Surgical History:  Procedure Laterality Date   APPENDECTOMY     CATARACT EXTRACTION W/PHACO Right 05/21/2019   Procedure: CATARACT EXTRACTION PHACO AND INTRAOCULAR LENS PLACEMENT (IOC) RIGHT panoptix lens  00:35.0  21.7%  7.61;  Surgeon: Mittie Gaskin, MD;  Location: [facility];  Service: Ophthalmology;  Laterality: Right;  sleep apnea requests early   CATARACT EXTRACTION W/PHACO Left 06/11/2019   Procedure: CATARACT EXTRACTION PHACO AND INTRAOCULAR LENS PLACEMENT (IOC) LEFT PANOPTIX TORIC LENS  00:50.0  20.3%  10.21;  Surgeon: Mittie Gaskin, MD;  Location: [facility];  Service: Ophthalmology;   Laterality: Left;  sleep apnea requests early   COLONOSCOPY     last 2012 - [facility]   ESOPHAGOGASTRODUODENOSCOPY     Multiple, last 01/07/2017 with Savary dilation to 18 mm   EYE SURGERY  August 2022   Cataracs removed   FRACTURE SURGERY  2025   HEMORRHOID BANDING  2018   [facility]   INTRAMEDULLARY (IM) NAIL INTERTROCHANTERIC Right 09/06/2023   Procedure: INTRAMEDULLARY (IM) NAIL INTERTROCHANTERIC;  Surgeon: Celena Sharper, MD;  Location: [facility];  Service: Orthopedics;  Laterality: Right;   LUMBAR DISC SURGERY  1984   L5    Patient Active Problem List   Diagnosis Date Noted   Vitamin D  deficiency 01/29/2024   Closed right hip fracture (HCC) 09/05/2023   Current use of proton pump inhibitor 03/13/2023   Pain in joint, lower leg 01/30/2023   Migraine    Essential hypertension    H/O: CVA (cerebrovascular accident) 11/08/2020   Colon cancer screening 02/05/2018   Degenerative joint disease (DJD) of lumbar spine 05/02/2017   Internal hemorrhoids 05/02/2017   Generalized osteoarthritis of hand 12/11/2016   GAD (generalized anxiety disorder) 12/11/2016   Estrogen deficiency 05/11/2016   Encounter for screening mammogram for breast cancer 05/11/2016   OSA (obstructive sleep apnea) 03/06/2016   Left knee pain 10/18/2015  Snoring 10/18/2015   Routine general medical examination at a health care facility 02/28/2015   Hyperlipidemia 08/27/2012   Osteopenia 12/17/2009   BACK PAIN, LUMBAR 07/02/2009   IRRITABLE BOWEL SYNDROME 04/02/2009   Asymptomatic postmenopausal status 12/10/2008   Hypothyroidism 12/05/2007   Meniere's disease 12/05/2007   GERD 12/05/2007    PCP: Dr. Laine Balls  REFERRING PROVIDER: Dr. Ozell Bruch  REFERRING DIAG: R hip fracture  THERAPY DIAG:  Abnormality of gait and mobility  Muscle weakness (generalized)  Difficulty in walking, not elsewhere classified  Pain in right hip  Other lack of coordination  Other abnormalities of gait and  mobility  Rationale for Evaluation and Treatment: Rehabilitation  ONSET DATE: 09/06/2023  SUBJECTIVE:   SUBJECTIVE STATEMENT: From Today: Patient offers no new complaints or issues.   From Eval:  Patient reports feeling better overall and states her MD wants her back in PT to work her out and strengthen her hip and work on balance. She denies any recent falls and reports her hip is feeling better since she was in PT back in June.   PERTINENT HISTORY: ANDREANA KLINGERMAN is a 84 year old female with past medical history significant for HTN, hypothyroidism, GERD, osteopenia, dyslipidemia, OSA who [facility] to [facility] ED on 1/15 fall mechanical fall at home sustaining injury to her right hip. Patient underwent intramedullary nailing of the right hip 09/06/2023 by Dr. Bruch. Patient was receiving outpatient PT in May however was admitted back to hospital with Hyponatremia and acute metabolic encephalopathy. She has continued to be followed since release from hospital by Dr. Bruch for ongoing hip pain and received a bone stimulator/ultrasound device to help with non-healing hip and -Ultrasound/bone stimulator treatments 2 sets of 6 week treatment. Has competed 80/90 treatemnt and has f/u with Dr. Bruch on 10/29.  PAIN:  Are you having pain? Yes: NPRS scale: 2/10  Pain location: Right posterior superior gluteal Pain description: aggravating achy- constant Aggravating factors: Worse with ultrasound treatment Relieving factors: lying flat  PRECAUTIONS: Other: WBAT RLE and no restrictions per MD order  RED FLAGS: None   WEIGHT BEARING RESTRICTIONS: R LE WBAT  FALLS:  Has patient fallen in last 6 months? No  LIVING ENVIRONMENT: Lives with: lives with their spouse Lives in: House/apartment Stairs: Yes: Internal: 2 steps; on right going up Has following equipment at home: Vannie - 2 wheeled and Grab bars  OCCUPATION: Stage manager  PLOF: Independent  PATIENT GOALS: Hope to be able to  walk on my own without a device and be able to drive independently  NEXT MD VISIT: 06/18/2024- Dr. Bruch  OBJECTIVE:   Note: Objective measures were completed at Evaluation unless otherwise noted.  DIAGNOSTIC FINDINGS:  ADDENDUM REPORT: 03/05/2024 08:07   ADDENDUM: The original report was by Dr. Ryan Salvage. The following addendum is by Dr. Ryan Salvage:   With respect to further specifics on the intertrochanteric fracture, the original fracture as shown on the radiographs from 09/05/2023 does include a small comminuted avulsion of the lesser trochanter, with a component measuring 2.4 by 1.0 by 0.6 cm, shown for example on image 63 series 5 and image 102 series 2. It is conceivable that the lucency described as likely hyperemic could have been a donor site for this fragment, although the fragment is about 1.9 cm posterior to this lucency, and the natural action of the iliopsoas would tend to pull the fragment anteriorly and cephalad rather than posteriorly.   With respect to infection as a cause for the  lucency, I am skeptical given the lack of abnormal lucency extending along the hardware, although if the patient is experiencing fever and signs of infection then close monitoring would be recommended.     Electronically Signed   By: Ryan Salvage M.D.   On: 03/05/2024 08:07    Addended by Salvage Sawyers, MD on 03/05/2024  8:09 AM    Study Result  Narrative & Impression  CLINICAL DATA:  Right SI joint pain. Prior right hip intertrochanteric fracture ORIF in January 2025.   EXAM: CT PELVIS WITHOUT CONTRAST   TECHNIQUE: Multidetector CT imaging of the pelvis was performed following the standard protocol without intravenous contrast.   RADIATION DOSE REDUCTION: This exam was performed according to the departmental dose-optimization program which includes automated exposure control, adjustment of the mA and/or kV according to patient size and/or use of  iterative reconstruction technique.   COMPARISON:  Multiple exams, including radiographs 09/05/2023   FINDINGS: OSSEOUS STRUCTURES AND JOINTS:   Lower lumbar degenerative facet arthropathy. No SI joint diastasis. A small amount of nitrogen gas phenomenon in both SI joints without SI joint effusion observed. No erosions or other specific abnormalities along the SI joints aside from mild spurring.   No pelvic fracture observed.   Right femoral nail system in place with single distal interlocking screw and femoral head screw noted. This traverses the intertrochanteric fracture which is still visible, with notable lucency along part of the medial fracture margin on image 52 series 5 and image 99 series 2 probably from hyperemic resorption of bone in this local vicinity. There is potentially some early bridging of bone anteriorly at the fracture site. Early cortication along the posterior portion of the fracture may suggest early nonunion of the posterior portion for example as on image 62 series 5.   No abnormal lucency around the components of the nail system to indicate loosening or infection.   MUSCULOTENDINOUS:   Unremarkable   SACRAL PLEXUS:   No impinging lesion along the sacral plexus or proximal sciatic nerves.   OTHER:   Prominent stool throughout the colon favors constipation. Mild degenerative arthropathy of the left (contralateral) hip.   IMPRESSION: 1. No specific abnormality is identified to explain the patient's right SI joint pain. 2. Right femoral nail system in place traversing the intertrochanteric fracture. Questionable partial early nonunion of the posterior portion of the fracture anterior bridging is appreciated. There is some accentuated lucency along the medial portion of the fracture probably from hyperemic resorption of bone, but no abnormal extension of lucency along components of the IM nail or femoral head nail to suggest  loosening/infection. 3. Prominent stool throughout the colon favors constipation. 4. Mild degenerative arthropathy of the left (contralateral) hip. 5. Lower lumbar degenerative facet arthropathy.   Electronically Signed: By: Ryan Salvage M.D. On: 01/15/2024 16:54     PATIENT SURVEYS:  ABC scale: The Activities-Specific Balance Confidence (ABC) Scale 0% 10 20 30  40 50 60 70 80 90 100% No confidence<->completely confident  How confident are you that you will not lose your balance or become unsteady when you . . .   Date tested 06/10/2024 (with walker)        08/25/2024 (with cane)  Walk around the house 80% 70  2. Walk up or down stairs 20% 0% w/o rail  3. Bend over and pick up a slipper from in front of a closet floor 30% 50  4. Reach for a small can off a shelf at eye level 80%  70  5. Stand on tip toes and reach for something above your head 20% 0  6. Stand on a chair and reach for something 0% 0  7. Sweep the floor 60% 30  8. Walk outside the house to a car parked in the driveway 50% 0  9. Get into or out of a car 50% 10  10. Walk across a parking lot to the mall 10% 0  11. Walk up or down a ramp 50% 0  12. Walk in a crowded mall where people rapidly walk past you 10% 10  13. Are bumped into by people as you walk through the mall 10% 0  14. Step onto or off of an escalator while you are holding onto the railing 20% 10  15. Step onto or off an escalator while holding onto parcels such that you cannot hold onto the railing 10% 0  16. Walk outside on icy sidewalks 0% 0  Total: #/16 26.25% 15.62     COGNITION: Overall cognitive status: Within functional limits for tasks assessed     SENSATION: WFL   POSTURE: rounded shoulders and forward head  PALPATION: (+) tenderness (lateral hip and posteior right off edge of R sided sacrum)   LOWER EXTREMITY ROM:  Active ROM Right eval Left eval  Hip flexion    Hip extension    Hip abduction    Hip adduction    Hip  internal rotation    Hip external rotation    Knee flexion    Knee extension    Ankle dorsiflexion    Ankle plantarflexion    Ankle inversion    Ankle eversion     (Blank rows = not tested)  LOWER EXTREMITY MMT:  MMT Right eval Left eval  Hip flexion 4 4+  Hip extension    Hip abduction 4 4+  Hip adduction    Hip internal rotation 4 4+  Hip external rotation 4 4+  Knee flexion 4 4  Knee extension 4 4  Ankle dorsiflexion 4+ 4+  Ankle plantarflexion    Ankle inversion    Ankle eversion     (Blank rows = not tested)  LOWER EXTREMITY SPECIAL TESTS:  None  FUNCTIONAL TESTS:  30 seconds chair stand test= Unable to stand without UE support today but did perform 10 reps with min UE support Timed up and go (TUG): 15.10 and 15.33=15.2 sec with RW 6 minute walk test: to be assessed next visit. 10 meter walk test: 12.10 and 11.69 sec =0.85 m/s avg with RW  Berg Balance Scale:  Item Test date: 06/10/2024 Date:  Date:   Sitting to standing 3. able to stand independently using hands Insert SmartPhrase OPRCBERGREEVAL Insert SmartPhrase OPRCBERGREEVAL  2. Standing unsupported 4. able to stand safely for 2 minutes    3. Sitting with back unsupported, feet supported 4. able to sit safely and securely for 2 minutes    4. Standing to sitting 3. controls descent by using hands    5. Pivot transfer  3. able to transfer safely with definite need of hands    6. Standing unsupported with eyes closed 4. able to stand 10 seconds safely    7. Standing unsupported with feet together 4. able to place feet together independently and stand 1 minute safely    8. Reaching forward with outstretched arms while standing 4. can reach forward confidently 25 cm (10 inches)    9. Pick up object from the floor from standing 3. able to  pick up slipper but needs supervision    10. Turning to look behind over left and right shoulders while standing 2. turns sideways but only maintains balance    11. Turn 360  degrees 2. able to turn 360 degrees safely but slowly    12. Place alternate foot on step or stool while standing unsupported 3. able to stand independently and complete 8 steps in > 20 seconds     13. Standing unsupported one foot in front 2. able to take small step independently and hold 30 seconds    14. Standing on one leg 1. tries to lift leg unable to hold 3 seconds but remains standing independently.      Total Score 42/56 Total Score:    Total Score:      GAIT: Distance walked: 150 feet Assistive device utilized: Walker - 2 wheeled Level of assistance: CGA Comments: Mild antalgia noted on right and decreased step length - short reciprocal steps with use of RW                                                                                                                                TREATMENT DATE: 08/27/2024    Unless otherwise stated, CGA was provided and gait belt donned in order to ensure pt safety  TA: 15 min walk in hospital using her cane - CGA at times - some imbalance with some turning yet overall good reciprocal steps- limited by fatigue and mild R hip soreness.   Sit to stand 10 reps without UE support Side Step up with RTB around knee with a step positioned each side of her x 15 reps each  Fwd/bwd step ups onto 4 step x 15 reps.   Self care- reviewed current HEP and re-printed with emphasis on transitioning from 2x week to 1x week for next few weeks  and have her focus more on her HEP.    PATIENT EDUCATION:  Education details: rationale for 6 min walk and instruction in HEP Person educated: Patient and Spouse Education method: Explanation Education comprehension: verbalized understanding  HOME EXERCISE PROGRAM: Access Code: [email or web address] URL: [email or web address] Date: 06/16/2024 Prepared by: Reyes London  Exercises - Single Leg Stance  - 3 x weekly - 5-10 sets - up to 20 sec hold - Standing Hip Flexion March  - 3 x weekly - 3 sets -  10 reps - 2 sec hold - Tandem Stance  - 3 x weekly - 5 sets - 30 sec  hold - Side Stepping with Resistance at Ankles  - 3 x weekly - 3 sets - 10 reps - Sit to Stand with Arms Crossed  - 3 x weekly - 3 sets - 10 reps  ASSESSMENT:  CLINICAL IMPRESSION:  Did not retest goals as they were all just reassessed at progress report last week. However, due to her slow healing bone she still has some strength and balance impairments. She continues to lack  confidence in walking ability with use of cane- stating just does not trust her balance. Despite some gains in her balance scores and strength scores- she remains at increased risk of falling. Patient's condition has the potential to improve in response to therapy. Maximum improvement is yet to be obtained. The anticipated improvement is attainable and reasonable in a generally predictable time.  Added confidence goal to have her transition from dependence on walker to using cane for all community outings.  Pt will continue to benefit from skilled physical therapy intervention to address impairments, improve QOL, and attain therapy goals.    OBJECTIVE IMPAIRMENTS: Abnormal gait, cardiopulmonary status limiting activity, decreased activity tolerance, decreased balance, decreased coordination, decreased endurance, decreased mobility, difficulty walking, decreased strength, and pain.   ACTIVITY LIMITATIONS: carrying, lifting, bending, sitting, standing, squatting, stairs, and transfers  PARTICIPATION LIMITATIONS: meal prep, cleaning, laundry, driving, shopping, community activity, and yard work  PERSONAL FACTORS: 1-2 comorbidities: HTN, Arthritis are also affecting patient's functional outcome.   REHAB POTENTIAL: Good  CLINICAL DECISION MAKING: Stable/uncomplicated  EVALUATION COMPLEXITY: Low   GOALS: Goals reviewed with patient? Yes  SHORT TERM GOALS: Target date: 07/22/2024 Pt will be independent with HEP in order to improve strength and balance  in order to decrease fall risk and improve function at home. Baseline: EVAL- Patient reports has not been performing any former HEP since her hospitalization in June; 07/16/2024= HEP are going pretty well, no pain and compliant as best I can.  Goal status: MET   LONG TERM GOALS: Target date: 11/24/2024  Patient will report < 4/10 Right hip pain at worst with all functional activities.  Baseline: EVAL= worst = 7/10; 07/16/2024= 0/10 current but up to 6/10 at worst; 08/25/2024= 3/10 Right hip pain Goal status: PROGRESS  2.  Patient will complete >12 reps without use of UE support in 30 second sit to stand test indicating an increased LE strength and improved balance. Baseline: EVAL: Unable to stand without UE support today but did perform 10 reps with min UE support; 08/25/2024=11 reps without UE support  Goal status: INITIAL  3.  Patient will improve ABC score by 15 points to demonstrate statistically significant improvement in mobility and quality of life as it relates to their confidence in their balance.  Baseline: EVAL: 26.25; 08/25/2024= 15.62% Goal status: INITIAL   4.  Patient will increase Berg Balance score by > 6 points to demonstrate decreased fall risk during functional activities. Baseline: EVAL= 42/56; 08/25/2024= 47/56  Goal status: PROGRESS    5.   Patient will reduce timed up and go to <11 seconds to reduce fall risk and demonstrate improved transfer/gait ability. Baseline: EVAL= 15.2 sec with RW; 08/25/2024= 14.99 sec with Cane Goal status: PROGRESSING  6.   Patient will increase 10 meter walk test to >1.61m/s as to improve gait speed for better community ambulation and to reduce fall risk. Baseline: EVAL= 0.85 m/s with RW; 08/26/2023= 0.89 m/s with multipoint cane Goal status: PROGRESSING  7.   Patient will increase six minute walk test distance to >1000 for progression to community ambulator and improve gait ability Baseline: Eval= To be completed at visit #2; 06/16/2024= 805  feet with RW; 08/20/24= 950 feet with Cane and 3#AW on each LE; 08/25/2024= 1065 feet with cane Goal status: PROGRESSING  8. Patient will demonstrate improved confidence in her ability from transition to using Walker for community outings to using her cane for improved functional independence in community.   Baseline: 09/01/2024: Patient and her  husband reported decreased overall confidence and still using walker for most community outings.  Goal status: NEW    PLAN:  PT FREQUENCY: 1-2x/week  PT DURATION: 12 weeks  PLANNED INTERVENTIONS: 97164- PT Re-evaluation, 97750- Physical Performance Testing, 97110-Therapeutic exercises, 97530- Therapeutic activity, [id number]- Neuromuscular re-education, 97535- Self Care, [id number]- Manual therapy, [id number]- Gait training, [phone]- Orthotic Initial, [phone]- Orthotic/Prosthetic subsequent, [phone]- Canalith repositioning, [phone]- Electrical stimulation (manual), [phone] (1-2 muscles), [id number] (3+ muscles)- Dry Needling, Patient/Family education, Balance training, Stair training, Taping, Joint mobilization, Joint manipulation, Spinal mobilization, Vestibular training, DME instructions, Cryotherapy, and Moist heat  PLAN FOR NEXT SESSION:  R hip strengthening Gait training with cane Balance training Add to HEP as appropriate.    Reyes LOISE London, PT 09/01/2024, 11:36 AM  "

## 2024-09-03 ENCOUNTER — Ambulatory Visit

## 2024-09-03 LAB — COLOGUARD: COLOGUARD: NEGATIVE

## 2024-09-04 ENCOUNTER — Ambulatory Visit: Admitting: Physician Assistant

## 2024-09-05 ENCOUNTER — Encounter: Payer: Self-pay | Admitting: Podiatry

## 2024-09-05 NOTE — Progress Notes (Signed)
"  °  Subjective:  Patient ID: Sarah Harper, female    DOB: 1941/03/03,  MRN: [id number]  Sarah Harper presents to clinic today for painful thick toenails that are difficult to trim. Pain interferes with ambulation. Aggravating factors include wearing enclosed shoe gear. Pain is relieved with periodic professional debridement.  Chief Complaint  Patient presents with   Nail Problem    Denies being diabetic. She saw Dr. Randeen 3 months ago   New problem(s): None.   PCP is Tower, Laine LABOR, MD.  Allergies[1]  Review of Systems: Negative except as noted in the HPI.  Objective: No changes noted in today's physical examination. There were no vitals filed for this visit. Sarah Harper is a pleasant 84 y.o. female WD, WN in NAD. AAO x 3.  Vascular Examination: Vascular status intact b/l with palpable pedal pulses. CFT immediate b/l. Pedal hair present. No edema. No pain with calf compression b/l. Skin temperature gradient WNL b/l. No varicosities noted. No cyanosis or clubbing noted.  Neurological Examination: Sensation grossly intact b/l with 10 gram monofilament. Vibratory sensation intact b/l.  Dermatological Examination: Pedal skin with normal turgor, texture and tone b/l. No open wounds nor interdigital macerations noted. Toenails 1-5 b/l thick, discolored, elongated with subungual debris and pain on dorsal palpation. No hyperkeratotic lesions noted b/l.   Musculoskeletal Examination: Muscle strength 5/5 to b/l LE.  No pain, crepitus noted b/l. HAV with bunion deformity b/l.  Radiographs: None  Assessment/Plan: 1. Pain due to onychomycosis of toenails of both feet   Consent given for treatment. Patient examined. All patient's and/or POA's questions/concerns addressed on today's visit. Mycotic toenails 1-5 b/l debrided in length and girth without incident. Continue soft, supportive shoe gear daily. Report any pedal injuries to medical professional. Call office if there are any  quesitons/concerns. -Patient/POA to call should there be question/concern in the interim.   Return in about 3 months (around 11/27/2024).  Delon LITTIE Merlin, DPM      [place] LOCATION: [address] [place] [address]                   Office [phone]   [facility] LOCATION: [address] [place], [place] [address] Office [phone]     [1]  Allergies Allergen Reactions   Aspirin  Other (See Comments)    GI intolerance    Nsaids Other (See Comments)    GI discomfort and gastritis   Tolmetin Other (See Comments)    GI discomfort and gastritis   "

## 2024-09-08 ENCOUNTER — Ambulatory Visit: Admitting: Physician Assistant

## 2024-09-08 ENCOUNTER — Ambulatory Visit

## 2024-09-08 DIAGNOSIS — R269 Unspecified abnormalities of gait and mobility: Secondary | ICD-10-CM

## 2024-09-08 DIAGNOSIS — M6281 Muscle weakness (generalized): Secondary | ICD-10-CM

## 2024-09-08 DIAGNOSIS — M25551 Pain in right hip: Secondary | ICD-10-CM

## 2024-09-08 DIAGNOSIS — R2689 Other abnormalities of gait and mobility: Secondary | ICD-10-CM

## 2024-09-08 DIAGNOSIS — R278 Other lack of coordination: Secondary | ICD-10-CM

## 2024-09-08 DIAGNOSIS — R262 Difficulty in walking, not elsewhere classified: Secondary | ICD-10-CM

## 2024-09-08 NOTE — Therapy (Signed)
 " OUTPATIENT PHYSICAL THERAPY LOWER EXTREMITY TREATMENT   Patient Name: Sarah Harper MRN: [id number] DOB:1941-07-27, 84 y.o., female Today's Date: 09/08/2024  END OF SESSION:  PT End of Session - 09/08/24 1021     Visit Number 23    Number of Visits 46    Date for Recertification  11/24/24    Progress Note Due on Visit 30    PT Start Time 1015    PT Stop Time 1059    PT Time Calculation (min) 44 min    Equipment Utilized During Treatment Gait belt    Activity Tolerance Patient tolerated treatment well    Behavior During Therapy WFL for tasks assessed/performed                   Past Medical History:  Diagnosis Date   Anxiety 2022   Arthritis    Blood loss anemia 09/13/2023   Cataract 1997   Colon cancer screening 02/05/2018   DDD (degenerative disc disease)    chronic back pain   ETD (eustachian tube dysfunction)    GERD (gastroesophageal reflux disease)    Hypertension 2022   IBS (irritable bowel syndrome)    Meniere's disease    Migraine    still gets visual aura from time to time   Mild cognitive impairment    Osteoporosis    Sleep apnea 2017   CPAP   Stroke (HCC) 2022   Syncopal episodes    after work-up - possible seizures?   Thyroid  disease 1990   hypothyroid   TIA (transient ischemic attack) 11/09/2020   Past Surgical History:  Procedure Laterality Date   APPENDECTOMY     CATARACT EXTRACTION W/PHACO Right 05/21/2019   Procedure: CATARACT EXTRACTION PHACO AND INTRAOCULAR LENS PLACEMENT (IOC) RIGHT panoptix lens  00:35.0  21.7%  7.61;  Surgeon: Mittie Gaskin, MD;  Location: [facility];  Service: Ophthalmology;  Laterality: Right;  sleep apnea requests early   CATARACT EXTRACTION W/PHACO Left 06/11/2019   Procedure: CATARACT EXTRACTION PHACO AND INTRAOCULAR LENS PLACEMENT (IOC) LEFT PANOPTIX TORIC LENS  00:50.0  20.3%  10.21;  Surgeon: Mittie Gaskin, MD;  Location: [facility];  Service: Ophthalmology;   Laterality: Left;  sleep apnea requests early   COLONOSCOPY     last 2012 - [facility]   ESOPHAGOGASTRODUODENOSCOPY     Multiple, last 01/07/2017 with Savary dilation to 18 mm   EYE SURGERY  August 2022   Cataracs removed   FRACTURE SURGERY  2025   HEMORRHOID BANDING  2018   [facility]   INTRAMEDULLARY (IM) NAIL INTERTROCHANTERIC Right 09/06/2023   Procedure: INTRAMEDULLARY (IM) NAIL INTERTROCHANTERIC;  Surgeon: Celena Sharper, MD;  Location: [facility];  Service: Orthopedics;  Laterality: Right;   LUMBAR DISC SURGERY  1984   L5    Patient Active Problem List   Diagnosis Date Noted   Vitamin D  deficiency 01/29/2024   Closed right hip fracture (HCC) 09/05/2023   Current use of proton pump inhibitor 03/13/2023   Pain in joint, lower leg 01/30/2023   Migraine    Essential hypertension    H/O: CVA (cerebrovascular accident) 11/08/2020   Colon cancer screening 02/05/2018   Degenerative joint disease (DJD) of lumbar spine 05/02/2017   Internal hemorrhoids 05/02/2017   Generalized osteoarthritis of hand 12/11/2016   GAD (generalized anxiety disorder) 12/11/2016   Estrogen deficiency 05/11/2016   Encounter for screening mammogram for breast cancer 05/11/2016   OSA (obstructive sleep apnea) 03/06/2016   Left knee pain 10/18/2015  Snoring 10/18/2015   Routine general medical examination at a health care facility 02/28/2015   Hyperlipidemia 08/27/2012   Osteopenia 12/17/2009   BACK PAIN, LUMBAR 07/02/2009   IRRITABLE BOWEL SYNDROME 04/02/2009   Asymptomatic postmenopausal status 12/10/2008   Hypothyroidism 12/05/2007   Meniere's disease 12/05/2007   GERD 12/05/2007    PCP: Dr. Laine Balls  REFERRING PROVIDER: Dr. Ozell Bruch  REFERRING DIAG: R hip fracture  THERAPY DIAG:  Abnormality of gait and mobility  Muscle weakness (generalized)  Difficulty in walking, not elsewhere classified  Pain in right hip  Other lack of coordination  Other abnormalities of gait and  mobility  Rationale for Evaluation and Treatment: Rehabilitation  ONSET DATE: 09/06/2023  SUBJECTIVE:   SUBJECTIVE STATEMENT: From Today: Patient reports hip is a little sore but not too bad. States compliant with current HEP.    From Eval:  Patient reports feeling better overall and states her MD wants her back in PT to work her out and strengthen her hip and work on balance. She denies any recent falls and reports her hip is feeling better since she was in PT back in June.   PERTINENT HISTORY: Sarah Harper is a 84 year old female with past medical history significant for HTN, hypothyroidism, GERD, osteopenia, dyslipidemia, OSA who presented to [facility] ED on 1/15 fall mechanical fall at home sustaining injury to her right hip. Patient underwent intramedullary nailing of the right hip 09/06/2023 by Dr. Bruch. Patient was receiving outpatient PT in May however was admitted back to hospital with Hyponatremia and acute metabolic encephalopathy. She has continued to be followed since release from hospital by Dr. Bruch for ongoing hip pain and received a bone stimulator/ultrasound device to help with non-healing hip and -Ultrasound/bone stimulator treatments 2 sets of 6 week treatment. Has competed 80/90 treatemnt and has f/u with Dr. Bruch on 10/29.  PAIN:  Are you having pain? Yes: NPRS scale: 2/10  Pain location: Right posterior superior gluteal Pain description: aggravating achy- constant Aggravating factors: Worse with ultrasound treatment Relieving factors: lying flat  PRECAUTIONS: Other: WBAT RLE and no restrictions per MD order  RED FLAGS: None   WEIGHT BEARING RESTRICTIONS: R LE WBAT  FALLS:  Has patient fallen in last 6 months? No  LIVING ENVIRONMENT: Lives with: lives with their spouse Lives in: House/apartment Stairs: Yes: Internal: 2 steps; on right going up Has following equipment at home: Vannie - 2 wheeled and Grab bars  OCCUPATION: Stage manager  PLOF:  Independent  PATIENT GOALS: Hope to be able to walk on my own without a device and be able to drive independently  NEXT MD VISIT: 06/18/2024- Dr. Bruch  OBJECTIVE:   Note: Objective measures were completed at Evaluation unless otherwise noted.  DIAGNOSTIC FINDINGS:  ADDENDUM REPORT: 03/05/2024 08:07   ADDENDUM: The original report was by Dr. Ryan Salvage. The following addendum is by Dr. Ryan Salvage:   With respect to further specifics on the intertrochanteric fracture, the original fracture as shown on the radiographs from 09/05/2023 does include a small comminuted avulsion of the lesser trochanter, with a component measuring 2.4 by 1.0 by 0.6 cm, shown for example on image 63 series 5 and image 102 series 2. It is conceivable that the lucency described as likely hyperemic could have been a donor site for this fragment, although the fragment is about 1.9 cm posterior to this lucency, and the natural action of the iliopsoas would tend to pull the fragment anteriorly and cephalad rather than posteriorly.  With respect to infection as a cause for the lucency, I am skeptical given the lack of abnormal lucency extending along the hardware, although if the patient is experiencing fever and signs of infection then close monitoring would be recommended.     Electronically Signed   By: Ryan Salvage M.D.   On: 03/05/2024 08:07    Addended by Salvage Sawyers, MD on 03/05/2024  8:09 AM    Study Result  Narrative & Impression  CLINICAL DATA:  Right SI joint pain. Prior right hip intertrochanteric fracture ORIF in January 2025.   EXAM: CT PELVIS WITHOUT CONTRAST   TECHNIQUE: Multidetector CT imaging of the pelvis was performed following the standard protocol without intravenous contrast.   RADIATION DOSE REDUCTION: This exam was performed according to the departmental dose-optimization program which includes automated exposure control, adjustment of the mA  and/or kV according to patient size and/or use of iterative reconstruction technique.   COMPARISON:  Multiple exams, including radiographs 09/05/2023   FINDINGS: OSSEOUS STRUCTURES AND JOINTS:   Lower lumbar degenerative facet arthropathy. No SI joint diastasis. A small amount of nitrogen gas phenomenon in both SI joints without SI joint effusion observed. No erosions or other specific abnormalities along the SI joints aside from mild spurring.   No pelvic fracture observed.   Right femoral nail system in place with single distal interlocking screw and femoral head screw noted. This traverses the intertrochanteric fracture which is still visible, with notable lucency along part of the medial fracture margin on image 52 series 5 and image 99 series 2 probably from hyperemic resorption of bone in this local vicinity. There is potentially some early bridging of bone anteriorly at the fracture site. Early cortication along the posterior portion of the fracture may suggest early nonunion of the posterior portion for example as on image 62 series 5.   No abnormal lucency around the components of the nail system to indicate loosening or infection.   MUSCULOTENDINOUS:   Unremarkable   SACRAL PLEXUS:   No impinging lesion along the sacral plexus or proximal sciatic nerves.   OTHER:   Prominent stool throughout the colon favors constipation. Mild degenerative arthropathy of the left (contralateral) hip.   IMPRESSION: 1. No specific abnormality is identified to explain the patient's right SI joint pain. 2. Right femoral nail system in place traversing the intertrochanteric fracture. Questionable partial early nonunion of the posterior portion of the fracture anterior bridging is appreciated. There is some accentuated lucency along the medial portion of the fracture probably from hyperemic resorption of bone, but no abnormal extension of lucency along components of the IM  nail or femoral head nail to suggest loosening/infection. 3. Prominent stool throughout the colon favors constipation. 4. Mild degenerative arthropathy of the left (contralateral) hip. 5. Lower lumbar degenerative facet arthropathy.   Electronically Signed: By: Ryan Salvage M.D. On: 01/15/2024 16:54     PATIENT SURVEYS:  ABC scale: The Activities-Specific Balance Confidence (ABC) Scale 0% 10 20 30  40 50 60 70 80 90 100% No confidence<->completely confident  How confident are you that you will not lose your balance or become unsteady when you . . .   Date tested 06/10/2024 (with walker)        08/25/2024 (with cane)  Walk around the house 80% 70  2. Walk up or down stairs 20% 0% w/o rail  3. Bend over and pick up a slipper from in front of a closet floor 30% 50  4. Reach for a  small can off a shelf at eye level 80% 70  5. Stand on tip toes and reach for something above your head 20% 0  6. Stand on a chair and reach for something 0% 0  7. Sweep the floor 60% 30  8. Walk outside the house to a car parked in the driveway 50% 0  9. Get into or out of a car 50% 10  10. Walk across a parking lot to the mall 10% 0  11. Walk up or down a ramp 50% 0  12. Walk in a crowded mall where people rapidly walk past you 10% 10  13. Are bumped into by people as you walk through the mall 10% 0  14. Step onto or off of an escalator while you are holding onto the railing 20% 10  15. Step onto or off an escalator while holding onto parcels such that you cannot hold onto the railing 10% 0  16. Walk outside on icy sidewalks 0% 0  Total: #/16 26.25% 15.62     COGNITION: Overall cognitive status: Within functional limits for tasks assessed     SENSATION: WFL   POSTURE: rounded shoulders and forward head  PALPATION: (+) tenderness (lateral hip and posteior right off edge of R sided sacrum)   LOWER EXTREMITY ROM:  Active ROM Right eval Left eval  Hip flexion    Hip extension    Hip  abduction    Hip adduction    Hip internal rotation    Hip external rotation    Knee flexion    Knee extension    Ankle dorsiflexion    Ankle plantarflexion    Ankle inversion    Ankle eversion     (Blank rows = not tested)  LOWER EXTREMITY MMT:  MMT Right eval Left eval  Hip flexion 4 4+  Hip extension    Hip abduction 4 4+  Hip adduction    Hip internal rotation 4 4+  Hip external rotation 4 4+  Knee flexion 4 4  Knee extension 4 4  Ankle dorsiflexion 4+ 4+  Ankle plantarflexion    Ankle inversion    Ankle eversion     (Blank rows = not tested)  LOWER EXTREMITY SPECIAL TESTS:  None  FUNCTIONAL TESTS:  30 seconds chair stand test= Unable to stand without UE support today but did perform 10 reps with min UE support Timed up and go (TUG): 15.10 and 15.33=15.2 sec with RW 6 minute walk test: to be assessed next visit. 10 meter walk test: 12.10 and 11.69 sec =0.85 m/s avg with RW  Berg Balance Scale:  Item Test date: 06/10/2024 Date:  Date:   Sitting to standing 3. able to stand independently using hands Insert SmartPhrase OPRCBERGREEVAL Insert SmartPhrase OPRCBERGREEVAL  2. Standing unsupported 4. able to stand safely for 2 minutes    3. Sitting with back unsupported, feet supported 4. able to sit safely and securely for 2 minutes    4. Standing to sitting 3. controls descent by using hands    5. Pivot transfer  3. able to transfer safely with definite need of hands    6. Standing unsupported with eyes closed 4. able to stand 10 seconds safely    7. Standing unsupported with feet together 4. able to place feet together independently and stand 1 minute safely    8. Reaching forward with outstretched arms while standing 4. can reach forward confidently 25 cm (10 inches)    9. Pick up  object from the floor from standing 3. able to pick up slipper but needs supervision    10. Turning to look behind over left and right shoulders while standing 2. turns sideways but only  maintains balance    11. Turn 360 degrees 2. able to turn 360 degrees safely but slowly    12. Place alternate foot on step or stool while standing unsupported 3. able to stand independently and complete 8 steps in > 20 seconds     13. Standing unsupported one foot in front 2. able to take small step independently and hold 30 seconds    14. Standing on one leg 1. tries to lift leg unable to hold 3 seconds but remains standing independently.      Total Score 42/56 Total Score:    Total Score:      GAIT: Distance walked: 150 feet Assistive device utilized: Walker - 2 wheeled Level of assistance: CGA Comments: Mild antalgia noted on right and decreased step length - short reciprocal steps with use of RW                                                                                                                                TREATMENT DATE: 08/27/2024    Unless otherwise stated, CGA was provided and gait belt donned in order to ensure pt safety   NMR:  -Step tap onto 1st step x 15 alt LE w/o UE support -Step tap onto 2nd x 15 alt LE w/o UE support -Tandem gait fwd along support bar x 10  -Tandem gait bwd along support bar x 10  -SLS - hold as long as possible (up to 20 sec on R)   TA: Skaters- at support bar x 15  Single Leg (forward T) x 15 reps Split squat 2 x 10      PATIENT EDUCATION:  Education details: rationale for 6 min walk and instruction in HEP Person educated: Patient and Spouse Education method: Explanation Education comprehension: verbalized understanding  HOME EXERCISE PROGRAM:  Access Code: [email or web address] URL: [email or web address] Date: 09/08/2024 Prepared by: Reyes London  Exercises - Forward Step Touch  - 3 x weekly - 3 sets - 10 reps - Tandem Walking with Counter Support  - 3 x weekly - 3 sets - 10 reps - Forward T  - 3 x weekly - 3 sets - 10 reps - Single Leg Stance  - 3 x weekly - 3 sets - 10 reps    Access Code:  [email or web address] URL: [email or web address] Date: 06/16/2024 Prepared by: Reyes London  Exercises - Single Leg Stance  - 3 x weekly - 5-10 sets - up to 20 sec hold - Standing Hip Flexion March  - 3 x weekly - 3 sets - 10 reps - 2 sec hold - Tandem Stance  - 3 x weekly - 5 sets - 30 sec  hold - Side Stepping with Resistance at Ankles  - 3 x  weekly - 3 sets - 10 reps - Sit to Stand with Arms Crossed  - 3 x weekly - 3 sets - 10 reps  ASSESSMENT:  CLINICAL IMPRESSION:  Patient demonstrated good overall progress as seen by ability to increase time with single leg stance and improved confidence with step taps overall today. She responded well to advanced BLE strengthening without report of any right hip pain today.  Pt will continue to benefit from skilled physical therapy intervention to address impairments, improve QOL, and attain therapy goals.    OBJECTIVE IMPAIRMENTS: Abnormal gait, cardiopulmonary status limiting activity, decreased activity tolerance, decreased balance, decreased coordination, decreased endurance, decreased mobility, difficulty walking, decreased strength, and pain.   ACTIVITY LIMITATIONS: carrying, lifting, bending, sitting, standing, squatting, stairs, and transfers  PARTICIPATION LIMITATIONS: meal prep, cleaning, laundry, driving, shopping, community activity, and yard work  PERSONAL FACTORS: 1-2 comorbidities: HTN, Arthritis are also affecting patient's functional outcome.   REHAB POTENTIAL: Good  CLINICAL DECISION MAKING: Stable/uncomplicated  EVALUATION COMPLEXITY: Low   GOALS: Goals reviewed with patient? Yes  SHORT TERM GOALS: Target date: 07/22/2024 Pt will be independent with HEP in order to improve strength and balance in order to decrease fall risk and improve function at home. Baseline: EVAL- Patient reports has not been performing any former HEP since her hospitalization in June; 07/16/2024= HEP are going pretty well, no pain and  compliant as best I can.  Goal status: MET   LONG TERM GOALS: Target date: 11/24/2024  Patient will report < 4/10 Right hip pain at worst with all functional activities.  Baseline: EVAL= worst = 7/10; 07/16/2024= 0/10 current but up to 6/10 at worst; 08/25/2024= 3/10 Right hip pain Goal status: PROGRESS  2.  Patient will complete >12 reps without use of UE support in 30 second sit to stand test indicating an increased LE strength and improved balance. Baseline: EVAL: Unable to stand without UE support today but did perform 10 reps with min UE support; 08/25/2024=11 reps without UE support  Goal status: INITIAL  3.  Patient will improve ABC score by 15 points to demonstrate statistically significant improvement in mobility and quality of life as it relates to their confidence in their balance.  Baseline: EVAL: 26.25; 08/25/2024= 15.62% Goal status: INITIAL   4.  Patient will increase Berg Balance score by > 6 points to demonstrate decreased fall risk during functional activities. Baseline: EVAL= 42/56; 08/25/2024= 47/56  Goal status: PROGRESS    5.   Patient will reduce timed up and go to <11 seconds to reduce fall risk and demonstrate improved transfer/gait ability. Baseline: EVAL= 15.2 sec with RW; 08/25/2024= 14.99 sec with Cane Goal status: PROGRESSING  6.   Patient will increase 10 meter walk test to >1.80m/s as to improve gait speed for better community ambulation and to reduce fall risk. Baseline: EVAL= 0.85 m/s with RW; 08/26/2023= 0.89 m/s with multipoint cane Goal status: PROGRESSING  7.   Patient will increase six minute walk test distance to >1000 for progression to community ambulator and improve gait ability Baseline: Eval= To be completed at visit #2; 06/16/2024= 805 feet with RW; 08/20/24= 950 feet with Cane and 3#AW on each LE; 08/25/2024= 1065 feet with cane Goal status: PROGRESSING  8. Patient will demonstrate improved confidence in her ability from transition to using Walker  for community outings to using her cane for improved functional independence in community.   Baseline: 09/01/2024: Patient and her husband reported decreased overall confidence and still using walker for most  community outings.  Goal status: NEW    PLAN:  PT FREQUENCY: 1-2x/week  PT DURATION: 12 weeks  PLANNED INTERVENTIONS: [id number]- PT Re-evaluation, 97750- Physical Performance Testing, 97110-Therapeutic exercises, 97530- Therapeutic activity, [id number]- Neuromuscular re-education, 97535- Self Care, [id number]- Manual therapy, [id number]- Gait training, [phone]- Orthotic Initial, [phone]- Orthotic/Prosthetic subsequent, [phone]- Canalith repositioning, [phone]- Electrical stimulation (manual), [phone] (1-2 muscles), [id number] (3+ muscles)- Dry Needling, Patient/Family education, Balance training, Stair training, Taping, Joint mobilization, Joint manipulation, Spinal mobilization, Vestibular training, DME instructions, Cryotherapy, and Moist heat  PLAN FOR NEXT SESSION:  R hip strengthening Gait training with cane Balance training Add to HEP as appropriate.    Reyes LOISE London, PT 09/08/2024, 4:38 PM  "

## 2024-09-09 ENCOUNTER — Encounter: Admitting: Physician Assistant

## 2024-09-10 ENCOUNTER — Ambulatory Visit

## 2024-09-15 ENCOUNTER — Ambulatory Visit

## 2024-09-17 ENCOUNTER — Encounter: Admitting: Physician Assistant

## 2024-09-17 ENCOUNTER — Ambulatory Visit

## 2024-09-18 ENCOUNTER — Encounter: Admitting: Physician Assistant

## 2024-09-22 ENCOUNTER — Ambulatory Visit

## 2024-09-24 ENCOUNTER — Ambulatory Visit

## 2024-09-29 ENCOUNTER — Ambulatory Visit

## 2024-10-01 ENCOUNTER — Ambulatory Visit

## 2024-10-02 ENCOUNTER — Encounter: Admitting: Physician Assistant

## 2024-10-06 ENCOUNTER — Ambulatory Visit

## 2024-10-08 ENCOUNTER — Ambulatory Visit

## 2024-10-15 ENCOUNTER — Ambulatory Visit

## 2024-11-05 ENCOUNTER — Ambulatory Visit

## 2024-11-10 ENCOUNTER — Ambulatory Visit

## 2024-11-17 ENCOUNTER — Ambulatory Visit

## 2024-11-24 ENCOUNTER — Ambulatory Visit

## 2024-12-01 ENCOUNTER — Ambulatory Visit

## 2024-12-05 ENCOUNTER — Ambulatory Visit: Admitting: Podiatry

## 2024-12-08 ENCOUNTER — Ambulatory Visit

## 2024-12-26 ENCOUNTER — Ambulatory Visit
